# Patient Record
Sex: Male | Born: 1945 | Race: White | Hispanic: No | Marital: Married | Smoking: Former smoker
Health system: Southern US, Community
[De-identification: ages and names within clinical notes are randomized; demographics above are authoritative.]

## PROBLEM LIST (undated history)

## (undated) DIAGNOSIS — I10 Essential (primary) hypertension: Secondary | ICD-10-CM

## (undated) DIAGNOSIS — N189 Chronic kidney disease, unspecified: Secondary | ICD-10-CM

## (undated) DIAGNOSIS — I341 Nonrheumatic mitral (valve) prolapse: Secondary | ICD-10-CM

## (undated) DIAGNOSIS — D696 Thrombocytopenia, unspecified: Secondary | ICD-10-CM

## (undated) DIAGNOSIS — F411 Generalized anxiety disorder: Secondary | ICD-10-CM

## (undated) DIAGNOSIS — M1990 Unspecified osteoarthritis, unspecified site: Secondary | ICD-10-CM

## (undated) DIAGNOSIS — C801 Malignant (primary) neoplasm, unspecified: Secondary | ICD-10-CM

## (undated) DIAGNOSIS — F32A Depression, unspecified: Secondary | ICD-10-CM

## (undated) DIAGNOSIS — Z0000 Encounter for general adult medical examination without abnormal findings: Secondary | ICD-10-CM

## (undated) DIAGNOSIS — F329 Major depressive disorder, single episode, unspecified: Secondary | ICD-10-CM

## (undated) DIAGNOSIS — Z85528 Personal history of other malignant neoplasm of kidney: Secondary | ICD-10-CM

## (undated) DIAGNOSIS — Z8619 Personal history of other infectious and parasitic diseases: Secondary | ICD-10-CM

## (undated) DIAGNOSIS — R739 Hyperglycemia, unspecified: Secondary | ICD-10-CM

## (undated) DIAGNOSIS — H409 Unspecified glaucoma: Secondary | ICD-10-CM

## (undated) DIAGNOSIS — K5900 Constipation, unspecified: Secondary | ICD-10-CM

## (undated) DIAGNOSIS — G709 Myoneural disorder, unspecified: Secondary | ICD-10-CM

## (undated) DIAGNOSIS — L600 Ingrowing nail: Secondary | ICD-10-CM

## (undated) DIAGNOSIS — R251 Tremor, unspecified: Secondary | ICD-10-CM

## (undated) DIAGNOSIS — Z9289 Personal history of other medical treatment: Secondary | ICD-10-CM

## (undated) DIAGNOSIS — G20 Parkinson's disease: Secondary | ICD-10-CM

## (undated) DIAGNOSIS — M79672 Pain in left foot: Secondary | ICD-10-CM

## (undated) DIAGNOSIS — E785 Hyperlipidemia, unspecified: Secondary | ICD-10-CM

## (undated) HISTORY — DX: Nonrheumatic mitral (valve) prolapse: I34.1

## (undated) HISTORY — DX: Tremor, unspecified: R25.1

## (undated) HISTORY — DX: Encounter for general adult medical examination without abnormal findings: Z00.00

## (undated) HISTORY — DX: Hyperglycemia, unspecified: R73.9

## (undated) HISTORY — DX: Personal history of other medical treatment: Z92.89

## (undated) HISTORY — DX: Hyperlipidemia, unspecified: E78.5

## (undated) HISTORY — DX: Chronic kidney disease, unspecified: N18.9

## (undated) HISTORY — PX: TOTAL NEPHRECTOMY: SHX415

## (undated) HISTORY — PX: COLONOSCOPY: SHX174

## (undated) HISTORY — DX: Myoneural disorder, unspecified: G70.9

## (undated) HISTORY — DX: Essential (primary) hypertension: I10

## (undated) HISTORY — DX: Malignant (primary) neoplasm, unspecified: C80.1

## (undated) HISTORY — PX: TONSILLECTOMY: SUR1361

## (undated) HISTORY — DX: Parkinson's disease: G20

## (undated) HISTORY — DX: Generalized anxiety disorder: F41.1

## (undated) HISTORY — DX: Constipation, unspecified: K59.00

## (undated) HISTORY — DX: Ingrowing nail: L60.0

## (undated) HISTORY — PX: CATARACT EXTRACTION, BILATERAL: SHX1313

## (undated) HISTORY — DX: Unspecified glaucoma: H40.9

## (undated) HISTORY — DX: Personal history of other malignant neoplasm of kidney: Z85.528

## (undated) HISTORY — DX: Thrombocytopenia, unspecified: D69.6

## (undated) HISTORY — DX: Pain in left foot: M79.672

## (undated) HISTORY — PX: TOE SURGERY: SHX1073

## (undated) HISTORY — DX: Personal history of other infectious and parasitic diseases: Z86.19

---
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
## 1993-04-02 HISTORY — PX: APPENDECTOMY: SHX54

## 1997-04-02 HISTORY — PX: OTHER SURGICAL HISTORY: SHX169

## 2001-04-02 HISTORY — PX: OTHER SURGICAL HISTORY: SHX169

## 2008-04-02 DIAGNOSIS — Z9289 Personal history of other medical treatment: Secondary | ICD-10-CM

## 2008-04-02 HISTORY — DX: Personal history of other medical treatment: Z92.89

## 2011-02-08 LAB — HM COLONOSCOPY

## 2011-12-25 DIAGNOSIS — N183 Chronic kidney disease, stage 3 unspecified: Secondary | ICD-10-CM | POA: Insufficient documentation

## 2011-12-25 DIAGNOSIS — I10 Essential (primary) hypertension: Secondary | ICD-10-CM | POA: Insufficient documentation

## 2011-12-25 HISTORY — DX: Essential (primary) hypertension: I10

## 2011-12-25 HISTORY — DX: Chronic kidney disease, stage 3 unspecified: N18.30

## 2014-04-30 DIAGNOSIS — F4320 Adjustment disorder, unspecified: Secondary | ICD-10-CM | POA: Diagnosis not present

## 2014-04-30 DIAGNOSIS — I10 Essential (primary) hypertension: Secondary | ICD-10-CM | POA: Diagnosis not present

## 2014-04-30 DIAGNOSIS — E785 Hyperlipidemia, unspecified: Secondary | ICD-10-CM | POA: Diagnosis not present

## 2014-05-19 DIAGNOSIS — M25561 Pain in right knee: Secondary | ICD-10-CM | POA: Diagnosis not present

## 2014-05-19 DIAGNOSIS — M25562 Pain in left knee: Secondary | ICD-10-CM | POA: Diagnosis not present

## 2014-05-31 DIAGNOSIS — N451 Epididymitis: Secondary | ICD-10-CM | POA: Diagnosis not present

## 2014-05-31 DIAGNOSIS — Z01818 Encounter for other preprocedural examination: Secondary | ICD-10-CM | POA: Diagnosis not present

## 2014-06-18 DIAGNOSIS — N183 Chronic kidney disease, stage 3 (moderate): Secondary | ICD-10-CM | POA: Diagnosis not present

## 2014-06-18 DIAGNOSIS — C649 Malignant neoplasm of unspecified kidney, except renal pelvis: Secondary | ICD-10-CM | POA: Diagnosis not present

## 2014-06-18 DIAGNOSIS — N451 Epididymitis: Secondary | ICD-10-CM | POA: Diagnosis not present

## 2014-06-29 DIAGNOSIS — Z049 Encounter for examination and observation for unspecified reason: Secondary | ICD-10-CM | POA: Diagnosis not present

## 2014-06-29 DIAGNOSIS — I10 Essential (primary) hypertension: Secondary | ICD-10-CM | POA: Diagnosis not present

## 2014-06-29 DIAGNOSIS — Z7982 Long term (current) use of aspirin: Secondary | ICD-10-CM | POA: Diagnosis not present

## 2014-06-29 DIAGNOSIS — Z01818 Encounter for other preprocedural examination: Secondary | ICD-10-CM | POA: Diagnosis not present

## 2014-06-29 DIAGNOSIS — Z79899 Other long term (current) drug therapy: Secondary | ICD-10-CM | POA: Diagnosis not present

## 2014-07-06 HISTORY — PX: TOTAL KNEE ARTHROPLASTY: SHX125

## 2014-07-08 DIAGNOSIS — I10 Essential (primary) hypertension: Secondary | ICD-10-CM | POA: Diagnosis present

## 2014-07-08 DIAGNOSIS — Z96652 Presence of left artificial knee joint: Secondary | ICD-10-CM | POA: Diagnosis not present

## 2014-07-08 DIAGNOSIS — M179 Osteoarthritis of knee, unspecified: Secondary | ICD-10-CM | POA: Diagnosis not present

## 2014-07-08 DIAGNOSIS — Z85528 Personal history of other malignant neoplasm of kidney: Secondary | ICD-10-CM | POA: Diagnosis not present

## 2014-07-08 DIAGNOSIS — Z79899 Other long term (current) drug therapy: Secondary | ICD-10-CM | POA: Diagnosis not present

## 2014-07-08 DIAGNOSIS — Z882 Allergy status to sulfonamides status: Secondary | ICD-10-CM | POA: Diagnosis not present

## 2014-07-08 DIAGNOSIS — Z471 Aftercare following joint replacement surgery: Secondary | ICD-10-CM | POA: Diagnosis not present

## 2014-07-08 DIAGNOSIS — Z7982 Long term (current) use of aspirin: Secondary | ICD-10-CM | POA: Diagnosis not present

## 2014-07-08 DIAGNOSIS — M1712 Unilateral primary osteoarthritis, left knee: Secondary | ICD-10-CM | POA: Diagnosis not present

## 2014-08-04 DIAGNOSIS — M25562 Pain in left knee: Secondary | ICD-10-CM | POA: Diagnosis not present

## 2014-08-31 DIAGNOSIS — H524 Presbyopia: Secondary | ICD-10-CM | POA: Diagnosis not present

## 2014-08-31 DIAGNOSIS — H40003 Preglaucoma, unspecified, bilateral: Secondary | ICD-10-CM | POA: Diagnosis not present

## 2014-08-31 DIAGNOSIS — H2513 Age-related nuclear cataract, bilateral: Secondary | ICD-10-CM | POA: Diagnosis not present

## 2014-09-13 DIAGNOSIS — G479 Sleep disorder, unspecified: Secondary | ICD-10-CM | POA: Diagnosis not present

## 2014-09-13 DIAGNOSIS — I10 Essential (primary) hypertension: Secondary | ICD-10-CM | POA: Diagnosis not present

## 2014-09-13 DIAGNOSIS — F419 Anxiety disorder, unspecified: Secondary | ICD-10-CM | POA: Diagnosis not present

## 2014-11-22 DIAGNOSIS — M25551 Pain in right hip: Secondary | ICD-10-CM | POA: Diagnosis not present

## 2014-11-22 DIAGNOSIS — M25521 Pain in right elbow: Secondary | ICD-10-CM | POA: Diagnosis not present

## 2014-11-22 DIAGNOSIS — M545 Low back pain: Secondary | ICD-10-CM | POA: Diagnosis not present

## 2014-11-22 DIAGNOSIS — M549 Dorsalgia, unspecified: Secondary | ICD-10-CM | POA: Diagnosis not present

## 2015-01-25 DIAGNOSIS — E785 Hyperlipidemia, unspecified: Secondary | ICD-10-CM | POA: Diagnosis not present

## 2015-01-25 DIAGNOSIS — D126 Benign neoplasm of colon, unspecified: Secondary | ICD-10-CM | POA: Diagnosis not present

## 2015-01-25 DIAGNOSIS — Z23 Encounter for immunization: Secondary | ICD-10-CM | POA: Diagnosis not present

## 2015-01-25 DIAGNOSIS — I10 Essential (primary) hypertension: Secondary | ICD-10-CM | POA: Diagnosis not present

## 2015-02-08 ENCOUNTER — Ambulatory Visit (INDEPENDENT_AMBULATORY_CARE_PROVIDER_SITE_OTHER): Payer: Federal, State, Local not specified - PPO | Admitting: Family Medicine

## 2015-02-08 ENCOUNTER — Encounter: Payer: Self-pay | Admitting: Family Medicine

## 2015-02-08 VITALS — HR 68 | Resp 14 | Ht 75.0 in | Wt 241.0 lb

## 2015-02-08 DIAGNOSIS — L600 Ingrowing nail: Secondary | ICD-10-CM

## 2015-02-08 DIAGNOSIS — Z85528 Personal history of other malignant neoplasm of kidney: Secondary | ICD-10-CM

## 2015-02-08 DIAGNOSIS — R251 Tremor, unspecified: Secondary | ICD-10-CM

## 2015-02-08 DIAGNOSIS — D696 Thrombocytopenia, unspecified: Secondary | ICD-10-CM | POA: Diagnosis not present

## 2015-02-08 DIAGNOSIS — N189 Chronic kidney disease, unspecified: Secondary | ICD-10-CM

## 2015-02-08 DIAGNOSIS — I10 Essential (primary) hypertension: Secondary | ICD-10-CM

## 2015-02-08 DIAGNOSIS — E785 Hyperlipidemia, unspecified: Secondary | ICD-10-CM | POA: Diagnosis not present

## 2015-02-08 DIAGNOSIS — Z8619 Personal history of other infectious and parasitic diseases: Secondary | ICD-10-CM | POA: Insufficient documentation

## 2015-02-08 DIAGNOSIS — N289 Disorder of kidney and ureter, unspecified: Secondary | ICD-10-CM

## 2015-02-08 HISTORY — DX: Thrombocytopenia, unspecified: D69.6

## 2015-02-08 HISTORY — DX: Essential (primary) hypertension: I10

## 2015-02-08 HISTORY — DX: Hyperlipidemia, unspecified: E78.5

## 2015-02-08 HISTORY — DX: Personal history of other malignant neoplasm of kidney: Z85.528

## 2015-02-08 MED ORDER — MUPIROCIN 2 % EX OINT: 1.0000 "application " | TOPICAL_OINTMENT | Freq: Two times a day (BID) | CUTANEOUS | Status: DC

## 2015-02-08 NOTE — Assessment & Plan Note (Signed)
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

## 2015-02-08 NOTE — Progress Notes (Signed)
Pre visit review using our clinic review tool, if applicable. No additional management support is needed unless otherwise documented below in the visit note. 

## 2015-02-08 NOTE — Patient Instructions (Addendum)
Preventive Care for Adults, Male A healthy lifestyle and preventive care can promote health and wellness. Preventive health guidelines for men include the following key practices:  A routine yearly physical is a good way to check with your health care provider about your health and preventative screening. It is a chance to share any concerns and updates on your health and to receive a thorough exam.  Visit your dentist for a routine exam and preventative care every 6 months. Brush your teeth twice a day and floss once a day. Good oral hygiene prevents tooth decay and gum disease.  The frequency of eye exams is based on your age, health, family medical history, use of contact lenses, and other factors. Follow your health care provider's recommendations for frequency of eye exams.  Eat a healthy diet. Foods such as vegetables, fruits, whole grains, low-fat dairy products, and lean protein foods contain the nutrients you need without too many calories. Decrease your intake of foods high in solid fats, added sugars, and salt. Eat the right amount of calories for you.Get information about a proper diet from your health care provider, if necessary.  Regular physical exercise is one of the most important things you can do for your health. Most adults should get at least 150 minutes of moderate-intensity exercise (any activity that increases your heart rate and causes you to sweat) each week. In addition, most adults need muscle-strengthening exercises on 2 or more days a week.  Maintain a healthy weight. The body mass index (BMI) is a screening tool to identify possible weight problems. It provides an estimate of body fat based on height and weight. Your health care provider can find your BMI and can help you achieve or maintain a healthy weight.For adults 20 years and older:  A BMI below 18.5 is considered underweight.  A BMI of 18.5 to 24.9 is normal.  A BMI of 25 to 29.9 is considered  overweight.  A BMI of 30 and above is considered obese.  Maintain normal blood lipids and cholesterol levels by exercising and minimizing your intake of saturated fat. Eat a balanced diet with plenty of fruit and vegetables. Blood tests for lipids and cholesterol should begin at age 20 and be repeated every 5 years. If your lipid or cholesterol levels are high, you are over 50, or you are at high risk for heart disease, you may need your cholesterol levels checked more frequently.Ongoing high lipid and cholesterol levels should be treated with medicines if diet and exercise are not working.  If you smoke, find out from your health care provider how to quit. If you do not use tobacco, do not start.  Lung cancer screening is recommended for adults aged 55-80 years who are at high risk for developing lung cancer because of a history of smoking. A yearly low-dose CT scan of the lungs is recommended for people who have at least a 30-pack-year history of smoking and are a current smoker or have quit within the past 15 years. A pack year of smoking is smoking an average of 1 pack of cigarettes a day for 1 year (for example: 1 pack a day for 30 years or 2 packs a day for 15 years). Yearly screening should continue until the smoker has stopped smoking for at least 15 years. Yearly screening should be stopped for people who develop a health problem that would prevent them from having lung cancer treatment.  If you choose to drink alcohol, do not have more   than 2 drinks per day. One drink is considered to be 12 ounces (355 mL) of beer, 5 ounces (148 mL) of wine, or 1.5 ounces (44 mL) of liquor.  Avoid use of street drugs. Do not share needles with anyone. Ask for help if you need support or instructions about stopping the use of drugs.  High blood pressure causes heart disease and increases the risk of stroke. Your blood pressure should be checked at least every 1-2 years. Ongoing high blood pressure should be  treated with medicines, if weight loss and exercise are not effective.  If you are 34-90 years old, ask your health care provider if you should take aspirin to prevent heart disease.  Diabetes screening is done by taking a blood sample to check your blood glucose level after you have not eaten for a certain period of time (fasting). If you are not overweight and you do not have risk factors for diabetes, you should be screened once every 3 years starting at age 35. If you are overweight or obese and you are 70-84 years of age, you should be screened for diabetes every year as part of your cardiovascular risk assessment.  Colorectal cancer can be detected and often prevented. Most routine colorectal cancer screening begins at the age of 18 and continues through age 69. However, your health care provider may recommend screening at an earlier age if you have risk factors for colon cancer. On a yearly basis, your health care provider may provide home test kits to check for hidden blood in the stool. Use of a small camera at the end of a tube to directly examine the colon (sigmoidoscopy or colonoscopy) can detect the earliest forms of colorectal cancer. Talk to your health care provider about this at age 71, when routine screening begins. Direct exam of the colon should be repeated every 5-10 years through age 18, unless early forms of precancerous polyps or small growths are found.  People who are at an increased risk for hepatitis B should be screened for this virus. You are considered at high risk for hepatitis B if:  You were born in a country where hepatitis B occurs often. Talk with your health care provider about which countries are considered high risk.  Your parents were born in a high-risk country and you have not received a shot to protect against hepatitis B (hepatitis B vaccine).  You have HIV or AIDS.  You use needles to inject street drugs.  You live with, or have sex with, someone who  has hepatitis B.  You are a man who has sex with other men (MSM).  You get hemodialysis treatment.  You take certain medicines for conditions such as cancer, organ transplantation, and autoimmune conditions.  Hepatitis C blood testing is recommended for all people born from [birth year] through 1965 and any individual with known risks for hepatitis C.  Practice safe sex. Use condoms and avoid high-risk sexual practices to reduce the spread of sexually transmitted infections (STIs). STIs include gonorrhea, chlamydia, syphilis, trichomonas, herpes, HPV, and human immunodeficiency virus (HIV). Herpes, HIV, and HPV are viral illnesses that have no cure. They can result in disability, cancer, and death.  If you are a man who has sex with other men, you should be screened at least once per year for:  HIV.  Urethral, rectal, and pharyngeal infection of gonorrhea, chlamydia, or both.  If you are at risk of being infected with HIV, it is recommended that you take a  prescription medicine daily to prevent HIV infection. This is called preexposure prophylaxis (PrEP). You are considered at risk if:  You are a man who has sex with other men (MSM) and have other risk factors.  You are a heterosexual man, are sexually active, and are at increased risk for HIV infection.  You take drugs by injection.  You are sexually active with a partner who has HIV.  Talk with your health care provider about whether you are at high risk of being infected with HIV. If you choose to begin PrEP, you should first be tested for HIV. You should then be tested every 3 months for as long as you are taking PrEP.  A one-time screening for abdominal aortic aneurysm (AAA) and surgical repair of large AAAs by ultrasound are recommended for men ages 44 to 66 years who are current or former smokers.  Healthy men should no longer receive prostate-specific antigen (PSA) blood tests as part of routine cancer screening. Talk with your health  care provider about prostate cancer screening.  Testicular cancer screening is not recommended for adult males who have no symptoms. Screening includes self-exam, a health care provider exam, and other screening tests. Consult with your health care provider about any symptoms you have or any concerns you have about testicular cancer.  Use sunscreen. Apply sunscreen liberally and repeatedly throughout the day. You should seek shade when your shadow is shorter than you. Protect yourself by wearing long sleeves, pants, a wide-brimmed hat, and sunglasses year round, whenever you are outdoors.  Once a month, do a whole-body skin exam, using a mirror to look at the skin on your back. Tell your health care provider about new moles, moles that have irregular borders, moles that are larger than a pencil eraser, or moles that have changed in shape or color.  Stay current with required vaccines (immunizations).  Influenza vaccine. All adults should be immunized every year.  Tetanus, diphtheria, and acellular pertussis (Td, Tdap) vaccine. An adult who has not previously received Tdap or who does not know his vaccine status should receive 1 dose of Tdap. This initial dose should be followed by tetanus and diphtheria toxoids (Td) booster doses every 10 years. Adults with an unknown or incomplete history of completing a 3-dose immunization series with Td-containing vaccines should begin or complete a primary immunization series including a Tdap dose. Adults should receive a Td booster every 10 years.  Varicella vaccine. An adult without evidence of immunity to varicella should receive 2 doses or a second dose if he has previously received 1 dose.  Human papillomavirus (HPV) vaccine. Males aged 11-21 years who have not received the vaccine previously should receive the 3-dose series. Males aged 22-26 years may be immunized. Immunization is recommended through the age of 23 years for any male who has sex with males  and did not get any or all doses earlier. Immunization is recommended for any person with an immunocompromised condition through the age of 72 years if he did not get any or all doses earlier. During the 3-dose series, the second dose should be obtained 4-8 weeks after the first dose. The third dose should be obtained 24 weeks after the first dose and 16 weeks after the second dose.  Zoster vaccine. One dose is recommended for adults aged 23 years or older unless certain conditions are present.  Measles, mumps, and rubella (MMR) vaccine. Adults born before [birth year] generally are considered immune to measles and mumps. Adults born in [birth year]  or later should have 1 or more doses of MMR vaccine unless there is a contraindication to the vaccine or there is laboratory evidence of immunity to each of the three diseases. A routine second dose of MMR vaccine should be obtained at least 28 days after the first dose for students attending postsecondary schools, health care workers, or international travelers. People who received inactivated measles vaccine or an unknown type of measles vaccine during 1963-1967 should receive 2 doses of MMR vaccine. People who received inactivated mumps vaccine or an unknown type of mumps vaccine before 1979 and are at high risk for mumps infection should consider immunization with 2 doses of MMR vaccine. Unvaccinated health care workers born before [birth year] who lack laboratory evidence of measles, mumps, or rubella immunity or laboratory confirmation of disease should consider measles and mumps immunization with 2 doses of MMR vaccine or rubella immunization with 1 dose of MMR vaccine.  Pneumococcal 13-valent conjugate (PCV13) vaccine. When indicated, a person who is uncertain of his immunization history and has no record of immunization should receive the PCV13 vaccine. All adults 9 years of age and older should receive this vaccine. An adult aged 69 years or older who has certain medical  conditions and has not been previously immunized should receive 1 dose of PCV13 vaccine. This PCV13 should be followed with a dose of pneumococcal polysaccharide (PPSV23) vaccine. Adults who are at high risk for pneumococcal disease should obtain the PPSV23 vaccine at least 8 weeks after the dose of PCV13 vaccine. Adults older than 71 years of age who have normal immune system function should obtain the PPSV23 vaccine dose at least 1 year after the dose of PCV13 vaccine.  Pneumococcal polysaccharide (PPSV23) vaccine. When PCV13 is also indicated, PCV13 should be obtained first. All adults aged 79 years and older should be immunized. An adult younger than age 43 years who has certain medical conditions should be immunized. Any person who resides in a nursing home or long-term care facility should be immunized. An adult smoker should be immunized. People with an immunocompromised condition and certain other conditions should receive both PCV13 and PPSV23 vaccines. People with human immunodeficiency virus (HIV) infection should be immunized as soon as possible after diagnosis. Immunization during chemotherapy or radiation therapy should be avoided. Routine use of PPSV23 vaccine is not recommended for American Indians, [place] Natives, or people younger than 65 years unless there are medical conditions that require PPSV23 vaccine. When indicated, people who have unknown immunization and have no record of immunization should receive PPSV23 vaccine. One-time revaccination 5 years after the first dose of PPSV23 is recommended for people aged 19-64 years who have chronic kidney failure, nephrotic syndrome, asplenia, or immunocompromised conditions. People who received 1-2 doses of PPSV23 before age 70 years should receive another dose of PPSV23 vaccine at age 79 years or later if at least 5 years have passed since the previous dose. Doses of PPSV23 are not needed for people immunized with PPSV23 at or after age 55  years.  Meningococcal vaccine. Adults with asplenia or persistent complement component deficiencies should receive 2 doses of quadrivalent meningococcal conjugate (MenACWY-D) vaccine. The doses should be obtained at least 2 months apart. Microbiologists working with certain meningococcal bacteria, [employer] recruits, people at risk during an outbreak, and people who travel to or live in countries with a high rate of meningitis should be immunized. A first-year college student up through age 64 years who is living in a residence hall should receive a  dose if he did not receive a dose on or after his 16th birthday. Adults who have certain high-risk conditions should receive one or more doses of vaccine.  Hepatitis A vaccine. Adults who wish to be protected from this disease, have chronic liver disease, work with hepatitis A-infected animals, work in hepatitis A research labs, or travel to or work in countries with a high rate of hepatitis A should be immunized. Adults who were previously unvaccinated and who anticipate close contact with an international adoptee during the first 60 days after arrival in the [place] from a country with a high rate of hepatitis A should be immunized.  Hepatitis B vaccine. Adults should be immunized if they wish to be protected from this disease, are under age 34 years and have diabetes, have chronic liver disease, have had more than one sex partner in the past 6 months, may be exposed to blood or other infectious body fluids, are household contacts or sex partners of hepatitis B positive people, are clients or workers in certain care facilities, or travel to or work in countries with a high rate of hepatitis B.  Haemophilus influenzae type b (Hib) vaccine. A previously unvaccinated person with asplenia or sickle cell disease or having a scheduled splenectomy should receive 1 dose of Hib vaccine. Regardless of previous immunization, a recipient of a hematopoietic stem cell  transplant should receive a 3-dose series 6-12 months after his successful transplant. Hib vaccine is not recommended for adults with HIV infection. Preventive Service / Frequency Ages 77 to 55  Blood pressure check.** / Every 3-5 years.  Lipid and cholesterol check.** / Every 5 years beginning at age 66.  Hepatitis C blood test.** / For any individual with known risks for hepatitis C.  Skin self-exam. / Monthly.  Influenza vaccine. / Every year.  Tetanus, diphtheria, and acellular pertussis (Tdap, Td) vaccine.** / Consult your health care provider. 1 dose of Td every 10 years.  Varicella vaccine.** / Consult your health care provider.  HPV vaccine. / 3 doses over 6 months, if 45 or younger.  Measles, mumps, rubella (MMR) vaccine.** / You need at least 1 dose of MMR if you were born in 1957 or later. You may also need a second dose.  Pneumococcal 13-valent conjugate (PCV13) vaccine.** / Consult your health care provider.  Pneumococcal polysaccharide (PPSV23) vaccine.** / 1 to 2 doses if you smoke cigarettes or if you have certain conditions.  Meningococcal vaccine.** / 1 dose if you are age 81 to 79 years and a Market researcher living in a residence hall, or have one of several medical conditions. You may also need additional booster doses.  Hepatitis A vaccine.** / Consult your health care provider.  Hepatitis B vaccine.** / Consult your health care provider.  Haemophilus influenzae type b (Hib) vaccine.** / Consult your health care provider. Ages 6 to 58  Blood pressure check.** / Every year.  Lipid and cholesterol check.** / Every 5 years beginning at age 89.  Lung cancer screening. / Every year if you are aged 84-80 years and have a 30-pack-year history of smoking and currently smoke or have quit within the past 15 years. Yearly screening is stopped once you have quit smoking for at least 15 years or develop a health problem that would prevent you from having  lung cancer treatment.  Fecal occult blood test (FOBT) of stool. / Every year beginning at age 90 and continuing until age 73. You may not have to do  this test if you get a colonoscopy every 10 years.  Flexible sigmoidoscopy** or colonoscopy.** / Every 5 years for a flexible sigmoidoscopy or every 10 years for a colonoscopy beginning at age 50 and continuing until age 75.  Hepatitis C blood test.** / For all people born from 1945 through 1965 and any individual with known risks for hepatitis C.  Skin self-exam. / Monthly.  Influenza vaccine. / Every year.  Tetanus, diphtheria, and acellular pertussis (Tdap/Td) vaccine.** / Consult your health care provider. 1 dose of Td every 10 years.  Varicella vaccine.** / Consult your health care provider.  Zoster vaccine.** / 1 dose for adults aged 60 years or older.  Measles, mumps, rubella (MMR) vaccine.** / You need at least 1 dose of MMR if you were born in 1957 or later. You may also need a second dose.  Pneumococcal 13-valent conjugate (PCV13) vaccine.** / Consult your health care provider.  Pneumococcal polysaccharide (PPSV23) vaccine.** / 1 to 2 doses if you smoke cigarettes or if you have certain conditions.  Meningococcal vaccine.** / Consult your health care provider.  Hepatitis A vaccine.** / Consult your health care provider.  Hepatitis B vaccine.** / Consult your health care provider.  Haemophilus influenzae type b (Hib) vaccine.** / Consult your health care provider. Ages 65 and over  Blood pressure check.** / Every year.  Lipid and cholesterol check.**/ Every 5 years beginning at age 20.  Lung cancer screening. / Every year if you are aged 55-80 years and have a 30-pack-year history of smoking and currently smoke or have quit within the past 15 years. Yearly screening is stopped once you have quit smoking for at least 15 years or develop a health problem that would prevent you from having lung cancer treatment.  Fecal  occult blood test (FOBT) of stool. / Every year beginning at age 50 and continuing until age 75. You may not have to do this test if you get a colonoscopy every 10 years.  Flexible sigmoidoscopy** or colonoscopy.** / Every 5 years for a flexible sigmoidoscopy or every 10 years for a colonoscopy beginning at age 50 and continuing until age 75.  Hepatitis C blood test.** / For all people born from 1945 through 1965 and any individual with known risks for hepatitis C.  Abdominal aortic aneurysm (AAA) screening.** / A one-time screening for ages 65 to 75 years who are current or former smokers.  Skin self-exam. / Monthly.  Influenza vaccine. / Every year.  Tetanus, diphtheria, and acellular pertussis (Tdap/Td) vaccine.** / 1 dose of Td every 10 years.  Varicella vaccine.** / Consult your health care provider.  Zoster vaccine.** / 1 dose for adults aged 60 years or older.  Pneumococcal 13-valent conjugate (PCV13) vaccine.** / 1 dose for all adults aged 65 years and older.  Pneumococcal polysaccharide (PPSV23) vaccine.** / 1 dose for all adults aged 65 years and older.  Meningococcal vaccine.** / Consult your health care provider.  Hepatitis A vaccine.** / Consult your health care provider.  Hepatitis B vaccine.** / Consult your health care provider.  Haemophilus influenzae type b (Hib) vaccine.** / Consult your health care provider. **Family history and personal history of risk and conditions may change your health care provider's recommendations.   This information is not intended to replace advice given to you by your health care provider. Make sure you discuss any questions you have with your health care provider.   Document Released: 05/15/2001 Document Revised: 04/09/2014 Document Reviewed: 08/14/2010 Elsevier Interactive Patient Education 2016   [place] in 1/2 hot water and 1/2 hydrogen peroxide 15 minutes once to twice daily. Then clean with hydrogen peroxide, then pull  skin away from toenail if able and apply cotton with Mupirocin applied Call for antibiotic if red, hot or pus filled, if not resolved can refer to podiatry for further care Ingrown Toenail An ingrown toenail occurs when the corner or sides of your toenail grow into the surrounding skin. The big toe is most commonly affected, but it can happen to any of your toes. If your ingrown toenail is not treated, you will be at risk for infection. CAUSES This condition may be caused by: Wearing shoes that are too small or tight. Injury or trauma, such as stubbing your toe or having your toe stepped on. Improper cutting or care of your toenails. Being born with (congenital) nail or foot abnormalities, such as having a nail that is too big for your toe. RISK FACTORS Risk factors for an ingrown toenail include: Age. Your nails tend to thicken as you get older, so ingrown nails are more common in older people. Diabetes. Cutting your toenails incorrectly. Blood circulation problems. SYMPTOMS Symptoms may include: Pain, soreness, or tenderness. Redness. Swelling. Hardening of the skin surrounding the toe. Your ingrown toenail may be infected if there is fluid, pus, or drainage. DIAGNOSIS  An ingrown toenail may be diagnosed by medical history and physical exam. If your toenail is infected, your health care provider may test a sample of the drainage. TREATMENT Treatment depends on the severity of your ingrown toenail. Some ingrown toenails may be treated at home. More severe or infected ingrown toenails may require surgery to remove all or part of the nail. Infected ingrown toenails may also be treated with antibiotic medicines. HOME CARE INSTRUCTIONS If you were prescribed an antibiotic medicine, finish all of it even if you start to feel better. Soak your foot in warm soapy water for 20 minutes, 3 times per day or as directed by your health care provider. Carefully lift the edge of the nail away from  the sore skin by wedging a small piece of cotton under the corner of the nail. This may help with the pain. Be careful not to cause more injury to the area. Wear shoes that fit well. If your ingrown toenail is causing you pain, try wearing sandals, if possible. Trim your toenails regularly and carefully. Do not cut them in a curved shape. Cut your toenails straight across. This prevents injury to the skin at the corners of the toenail. Keep your feet clean and dry. If you are having trouble walking and are given crutches by your health care provider, use them as directed. Do not pick at your toenail or try to remove it yourself. Take medicines only as directed by your health care provider. Keep all follow-up visits as directed by your health care provider. This is important. SEEK MEDICAL CARE IF: Your symptoms do not improve with treatment. SEEK IMMEDIATE MEDICAL CARE IF: You have red streaks that start at your foot and go up your leg. You have a fever. You have increased redness, swelling, or pain. You have fluid, blood, or pus coming from your toenail.   This information is not intended to replace advice given to you by your health care provider. Make sure you discuss any questions you have with your health care provider.   Document Released: 03/16/2000 Document Revised: 08/03/2014 Document Reviewed: 02/10/2014 Elsevier Interactive Patient Education [employer].

## 2015-02-09 LAB — CBC
HCT: 46.8 % (ref 39.0–52.0)
Hemoglobin: 15.6 g/dL (ref 13.0–17.0)
MCHC: 33.3 g/dL (ref 30.0–36.0)
MCV: 90.6 fl (ref 78.0–100.0)
PLATELETS: 184 10*3/uL (ref 150.0–400.0)
RBC: 5.17 Mil/uL (ref 4.22–5.81)
RDW: 14.4 % (ref 11.5–15.5)
WBC: 9.3 10*3/uL (ref 4.0–10.5)

## 2015-02-09 LAB — COMPREHENSIVE METABOLIC PANEL
ALBUMIN: 4.7 g/dL (ref 3.5–5.2)
ALT: 22 U/L (ref 0–53)
AST: 19 U/L (ref 0–37)
Alkaline Phosphatase: 77 U/L (ref 39–117)
BILIRUBIN TOTAL: 0.5 mg/dL (ref 0.2–1.2)
BUN: 20 mg/dL (ref 6–23)
CALCIUM: 10.3 mg/dL (ref 8.4–10.5)
CO2: 25 meq/L (ref 19–32)
CREATININE: 1.64 mg/dL — AB (ref 0.40–1.50)
Chloride: 105 mEq/L (ref 96–112)
GFR: 44.14 mL/min — ABNORMAL LOW (ref >60.00)
Glucose, Bld: 146 mg/dL — ABNORMAL HIGH (ref 70–99)
Potassium: 4.6 mEq/L (ref 3.5–5.1)
Sodium: 139 mEq/L (ref 135–145)
TOTAL PROTEIN: 7.3 g/dL (ref 6.0–8.3)

## 2015-02-09 LAB — TSH: TSH: 1.74 u[IU]/mL (ref 0.35–4.50)

## 2015-02-09 LAB — LIPID PANEL
CHOL/HDL RATIO: 3
CHOLESTEROL: 126 mg/dL (ref 0–200)
HDL: 47.5 mg/dL (ref >39.00)
LDL Cholesterol: 45 mg/dL (ref 0–99)
NonHDL: 78.91
Triglycerides: 172 mg/dL — ABNORMAL HIGH (ref 0.0–149.0)
VLDL: 34.4 mg/dL (ref 0.0–40.0)

## 2015-02-10 ENCOUNTER — Other Ambulatory Visit (INDEPENDENT_AMBULATORY_CARE_PROVIDER_SITE_OTHER): Payer: Federal, State, Local not specified - PPO

## 2015-02-10 DIAGNOSIS — R7309 Other abnormal glucose: Secondary | ICD-10-CM

## 2015-02-10 LAB — HEMOGLOBIN A1C: HEMOGLOBIN A1C: 5.9 % (ref 4.6–6.5)

## 2015-02-14 ENCOUNTER — Encounter: Payer: Self-pay | Admitting: Family Medicine

## 2015-02-14 ENCOUNTER — Other Ambulatory Visit: Payer: Self-pay | Admitting: Family Medicine

## 2015-02-14 DIAGNOSIS — I10 Essential (primary) hypertension: Secondary | ICD-10-CM

## 2015-02-14 DIAGNOSIS — R251 Tremor, unspecified: Secondary | ICD-10-CM | POA: Insufficient documentation

## 2015-02-14 DIAGNOSIS — N289 Disorder of kidney and ureter, unspecified: Secondary | ICD-10-CM

## 2015-02-14 DIAGNOSIS — M79672 Pain in left foot: Secondary | ICD-10-CM | POA: Insufficient documentation

## 2015-02-14 DIAGNOSIS — R7309 Other abnormal glucose: Secondary | ICD-10-CM

## 2015-02-14 DIAGNOSIS — N189 Chronic kidney disease, unspecified: Secondary | ICD-10-CM

## 2015-02-14 DIAGNOSIS — L600 Ingrowing nail: Secondary | ICD-10-CM

## 2015-02-14 HISTORY — DX: Ingrowing nail: L60.0

## 2015-02-14 HISTORY — DX: Pain in left foot: M79.672

## 2015-02-14 HISTORY — DX: Chronic kidney disease, unspecified: N18.9

## 2015-02-14 HISTORY — DX: Tremor, unspecified: R25.1

## 2015-02-14 NOTE — Assessment & Plan Note (Addendum)
Will monitor. Follows with nephrology

## 2015-02-14 NOTE — Assessment & Plan Note (Signed)
Tolerating statin, encouraged heart healthy diet, avoid trans fats, minimize simple carbs and saturated fats. Increase exercise as tolerated 

## 2015-02-14 NOTE — Assessment & Plan Note (Signed)
Encouraged to soak in H2O2 and H20 qhs and then separate skin from toenail and apply antibiotic ointment and cotton, if not improving will need referral to podiatry.

## 2015-02-14 NOTE — Assessment & Plan Note (Signed)
Long standing but slowly worsening, consider referral to neurology

## 2015-02-14 NOTE — Assessment & Plan Note (Signed)
no changes to meds. Encouraged heart healthy diet such as the DASH diet and exercise as tolerated.  

## 2015-02-14 NOTE — Assessment & Plan Note (Signed)
Platelets WNL on labs today

## 2015-02-14 NOTE — Progress Notes (Signed)
Subjective:    Patient ID: Vincent Walker, male    DOB: 07-01-1945, 69 y.o.   MRN: [id number]  Chief Complaint  Patient presents with  . Establish Care    HPI Patient is in today for new patient appointment. He has a past medical history significant for hypertension, chronic renal insufficiency secondary to her history of renal cell carcinoma and renal excision. Also noted to have dyslipidemia, thrombocytopenia, chickenpox, tremor. Feels well today, no acute concerns other than a mildly tender ingrown toenail in left great toenail. Denies CP/palp/SOB/HA/congestion/fevers/GI or GU c/o. Taking meds as prescribed  Past Medical History  Diagnosis Date  . Cancer [facility])     Kidney Cancer  . Mitral valve prolapse   . Hypertension   . Chronic kidney disease   . History of blood transfusion 2010    After Kidney surgery  . Benign essential HTN 02/08/2015  . Dyslipidemia 02/08/2015  . H/O renal cell carcinoma 02/08/2015  . Thrombocytopenia ([place]) 02/08/2015  . History of chicken pox   . Hyperlipidemia   . Tremor of right hand 02/14/2015  . Ingrown left big toenail 02/14/2015  . Chronic renal insufficiency 02/14/2015    Past Surgical History  Procedure Laterality Date  . Appendectomy  1995  . Tonsillectomy    . Right knee scope  1999  . Left knee scope  2003  . Total knee arthroplasty Left 07/06/2014  . Total nephrectomy Right     Family History  Problem Relation Age of Onset  . Alcohol abuse Father   . Hyperlipidemia Father   . Hypertension Father   . Diabetes Father   . Heart disease Father   . Cancer Maternal Aunt     lung cancer  . Cancer Paternal Uncle     bone cancer  . Stroke Mother     brain stem at age 36    Social History   Social History  . Marital Status: Married    Spouse Name: N/A  . Number of Children: N/A  . Years of Education: 16   Occupational History  . 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 Smoking status: Former  Smoker    Types: Pipe  . Smokeless tobacco: Not on file     Comment: stopped 20 years ago.  1996  . Alcohol Use: 0.0 oz/week    0 Standard drinks or equivalent per week     Comment: occasional  . Drug Use: No  . 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 Not on file   Social History Narrative    No outpatient prescriptions prior to visit.   No facility-administered medications prior to visit.    Allergies  Allergen Reactions  . Sulfa Antibiotics Other (See Comments)    Had a reaction as a child    Review of Systems  Constitutional: Negative for fever, chills and malaise/fatigue.  HENT: Negative for congestion and hearing loss.   Eyes: Negative for discharge.  Respiratory: Negative for cough, sputum production and shortness of breath.   Cardiovascular: Negative for chest pain, palpitations and leg swelling.  Gastrointestinal: Negative for heartburn, nausea, vomiting, abdominal pain, diarrhea, constipation and blood in stool.  Genitourinary: Negative for dysuria, urgency, frequency and hematuria.  Musculoskeletal: Positive for joint pain. Negative for myalgias, back pain and falls.  Skin: Negative for rash.  Neurological: Positive for tremors. Negative for dizziness, sensory change, loss of consciousness, weakness and headaches.  Endo/Heme/Allergies: Negative for environmental allergies. Does not bruise/bleed easily.  Psychiatric/Behavioral: Negative for depression and suicidal ideas. The patient is not nervous/anxious and does not have insomnia.        Objective:    Physical Exam  Constitutional: He is oriented to person, place, and time. He appears well-developed and well-nourished. No distress.  HENT:  Head: Normocephalic and atraumatic.  Eyes: Conjunctivae are normal.  Neck: Neck supple. No thyromegaly present.  Cardiovascular: Normal rate, regular  rhythm and normal heart sounds.   No murmur heard. Pulmonary/Chest: Effort normal and breath sounds normal. No respiratory distress. He has no wheezes.  Abdominal: Soft. Bowel sounds are normal. He exhibits no mass. There is no tenderness.  Musculoskeletal: He exhibits no edema.  Lymphadenopathy:    He has no cervical adenopathy.  Neurological: He is alert and oriented to person, place, and time.  Fine tremor right arm resting  Skin: Skin is warm and dry.  Left great toe, nail bed along medial aspect of toe reveals skin that is swollen and mildly erythematous. No tenderness or discharge.  Psychiatric: He has a normal mood and affect. His behavior is normal.    Pulse 68  Resp 14  Ht 6\' 3"  (1.905 m)  Wt 241 lb (109.317 kg)  BMI 30.12 kg/m2 Wt Readings from Last 3 Encounters:  02/14/15 241 lb (109.317 kg)     Lab Results  Component Value Date   WBC 9.3 02/08/2015   HGB 15.6 02/08/2015   HCT 46.8 02/08/2015   PLT 184.0 02/08/2015   GLUCOSE 146* 02/08/2015   CHOL 126 02/08/2015   TRIG 172.0* 02/08/2015   HDL 47.50 02/08/2015   LDLCALC 45 02/08/2015   ALT 22 02/08/2015   AST 19 02/08/2015   NA 139 02/08/2015   K 4.6 02/08/2015   CL 105 02/08/2015   CREATININE 1.64* 02/08/2015   BUN 20 02/08/2015   CO2 25 02/08/2015   TSH 1.74 02/08/2015   HGBA1C 5.9 02/10/2015    Lab Results  Component Value Date   TSH 1.74 02/08/2015   Lab Results  Component Value Date   WBC 9.3 02/08/2015   HGB 15.6 02/08/2015   HCT 46.8 02/08/2015   MCV 90.6 02/08/2015   PLT 184.0 02/08/2015   Lab Results  Component Value Date   NA 139 02/08/2015   K 4.6 02/08/2015   CO2 25 02/08/2015   GLUCOSE 146* 02/08/2015   BUN 20 02/08/2015   CREATININE 1.64* 02/08/2015   BILITOT 0.5 02/08/2015   ALKPHOS 77 02/08/2015   AST 19 02/08/2015   ALT 22 02/08/2015   PROT 7.3 02/08/2015   ALBUMIN 4.7 02/08/2015   CALCIUM 10.3 02/08/2015   GFR 44.14* 02/08/2015   Lab Results  Component Value Date    CHOL 126 02/08/2015   Lab Results  Component Value Date   HDL 47.50 02/08/2015   Lab Results  Component Value Date   LDLCALC 45 02/08/2015   Lab Results  Component Value Date   TRIG 172.0* 02/08/2015   Lab Results  Component Value Date   CHOLHDL 3 02/08/2015   Lab Results  Component Value Date   HGBA1C 5.9 02/10/2015       Assessment & Plan:   Problem List Items Addressed This Visit    Benign essential HTN - Primary    no changes to meds. Encouraged heart healthy diet such as the DASH diet  and exercise as tolerated.       Relevant Medications   atenolol (TENORMIN) 50 MG tablet   amLODipine (NORVASC) 10 MG tablet   atorvastatin (LIPITOR) 20 MG tablet   aspirin 81 MG tablet   Other Relevant Orders   TSH (Completed)   CBC (Completed)   Comprehensive metabolic panel (Completed)   Lipid panel (Completed)   Chronic renal insufficiency    Will monitor. Follows with nephrology      Dyslipidemia    Tolerating statin, encouraged heart healthy diet, avoid trans fats, minimize simple carbs and saturated fats. Increase exercise as tolerated      Relevant Medications   atorvastatin (LIPITOR) 20 MG tablet   Other Relevant Orders   TSH (Completed)   CBC (Completed)   Comprehensive metabolic panel (Completed)   Lipid panel (Completed)   H/O renal cell carcinoma    diagnosed and removed in 2010 Right Monitored by Dr Tresa Endo of urology at [facility] Dr Richarda Overlie, nephrology at [facility]      Relevant Orders   TSH (Completed)   CBC (Completed)   Comprehensive metabolic panel (Completed)   Lipid panel (Completed)   Ingrown left big toenail    Encouraged to soak in H2O2 and H20 qhs and then separate skin from toenail and apply antibiotic ointment and cotton, if not improving will need referral to podiatry.      Thrombocytopenia ([facility])    Platelets WNL on labs today      Relevant Orders   TSH (Completed)   CBC (Completed)   Comprehensive metabolic panel  (Completed)   Lipid panel (Completed)   Tremor of right hand    Long standing but slowly worsening, consider referral to neurology         I am having Mr. Sturch start on mupirocin ointment. I am also having him maintain his atenolol, amLODipine, atorvastatin, aspirin, Multiple Vitamins-Minerals (CENTRUM SILVER PO), and LORazepam.  Meds ordered this encounter  Medications  . atenolol (TENORMIN) 50 MG tablet    Sig: Take 50 mg by mouth daily.  Marland Kitchen amLODipine (NORVASC) 10 MG tablet    Sig: Take 10 mg by mouth daily.  Marland Kitchen atorvastatin (LIPITOR) 20 MG tablet    Sig: Take 20 mg by mouth daily.  Marland Kitchen aspirin 81 MG tablet    Sig: Take 81 mg by mouth daily.  . Multiple Vitamins-Minerals (CENTRUM SILVER PO)    Sig: Take by mouth daily.  Marland Kitchen LORazepam (ATIVAN) 1 MG tablet    Sig: Take 1 mg by mouth daily as needed for anxiety.  . mupirocin ointment (BACTROBAN) 2 %    Sig: Place 1 application into the nose 2 (two) times daily.    Dispense:  30 g    Refill:  0     [name], MD

## 2015-02-16 DIAGNOSIS — H01001 Unspecified blepharitis right upper eyelid: Secondary | ICD-10-CM | POA: Diagnosis not present

## 2015-02-16 DIAGNOSIS — H40003 Preglaucoma, unspecified, bilateral: Secondary | ICD-10-CM | POA: Diagnosis not present

## 2015-02-16 DIAGNOSIS — Z83511 Family history of glaucoma: Secondary | ICD-10-CM | POA: Diagnosis not present

## 2015-02-16 DIAGNOSIS — H2513 Age-related nuclear cataract, bilateral: Secondary | ICD-10-CM | POA: Diagnosis not present

## 2015-02-18 ENCOUNTER — Other Ambulatory Visit (INDEPENDENT_AMBULATORY_CARE_PROVIDER_SITE_OTHER): Payer: Federal, State, Local not specified - PPO

## 2015-02-18 DIAGNOSIS — R7309 Other abnormal glucose: Secondary | ICD-10-CM

## 2015-02-18 DIAGNOSIS — N289 Disorder of kidney and ureter, unspecified: Secondary | ICD-10-CM | POA: Diagnosis not present

## 2015-02-18 DIAGNOSIS — I10 Essential (primary) hypertension: Secondary | ICD-10-CM | POA: Diagnosis not present

## 2015-02-18 LAB — COMPREHENSIVE METABOLIC PANEL
ALBUMIN: 4.6 g/dL (ref 3.5–5.2)
ALT: 26 U/L (ref 0–53)
AST: 23 U/L (ref 0–37)
Alkaline Phosphatase: 72 U/L (ref 39–117)
BUN: 18 mg/dL (ref 6–23)
CHLORIDE: 103 meq/L (ref 96–112)
CO2: 25 mEq/L (ref 19–32)
CREATININE: 1.61 mg/dL — AB (ref 0.40–1.50)
Calcium: 9.9 mg/dL (ref 8.4–10.5)
GFR: 45.09 mL/min — ABNORMAL LOW (ref >60.00)
GLUCOSE: 116 mg/dL — AB (ref 70–99)
Potassium: 4.7 mEq/L (ref 3.5–5.1)
SODIUM: 138 meq/L (ref 135–145)
TOTAL PROTEIN: 7.3 g/dL (ref 6.0–8.3)
Total Bilirubin: 0.8 mg/dL (ref 0.2–1.2)

## 2015-02-18 LAB — HEMOGLOBIN A1C: HEMOGLOBIN A1C: 5.9 % (ref 4.6–6.5)

## 2015-06-21 DIAGNOSIS — L821 Other seborrheic keratosis: Secondary | ICD-10-CM | POA: Diagnosis not present

## 2015-06-21 DIAGNOSIS — L570 Actinic keratosis: Secondary | ICD-10-CM | POA: Diagnosis not present

## 2015-06-21 DIAGNOSIS — L218 Other seborrheic dermatitis: Secondary | ICD-10-CM | POA: Diagnosis not present

## 2015-07-07 DIAGNOSIS — Z79899 Other long term (current) drug therapy: Secondary | ICD-10-CM | POA: Diagnosis not present

## 2015-07-07 DIAGNOSIS — Z87891 Personal history of nicotine dependence: Secondary | ICD-10-CM | POA: Diagnosis not present

## 2015-07-07 DIAGNOSIS — Z08 Encounter for follow-up examination after completed treatment for malignant neoplasm: Secondary | ICD-10-CM | POA: Diagnosis not present

## 2015-07-07 DIAGNOSIS — N183 Chronic kidney disease, stage 3 (moderate): Secondary | ICD-10-CM | POA: Diagnosis not present

## 2015-07-07 DIAGNOSIS — Z905 Acquired absence of kidney: Secondary | ICD-10-CM | POA: Diagnosis not present

## 2015-07-07 DIAGNOSIS — Z7982 Long term (current) use of aspirin: Secondary | ICD-10-CM | POA: Diagnosis not present

## 2015-07-07 DIAGNOSIS — Z85528 Personal history of other malignant neoplasm of kidney: Secondary | ICD-10-CM | POA: Diagnosis not present

## 2015-07-07 DIAGNOSIS — Z882 Allergy status to sulfonamides status: Secondary | ICD-10-CM | POA: Diagnosis not present

## 2015-07-07 DIAGNOSIS — I129 Hypertensive chronic kidney disease with stage 1 through stage 4 chronic kidney disease, or unspecified chronic kidney disease: Secondary | ICD-10-CM | POA: Diagnosis not present

## 2015-08-17 DIAGNOSIS — H2513 Age-related nuclear cataract, bilateral: Secondary | ICD-10-CM | POA: Diagnosis not present

## 2015-08-17 DIAGNOSIS — H01001 Unspecified blepharitis right upper eyelid: Secondary | ICD-10-CM | POA: Diagnosis not present

## 2015-08-17 DIAGNOSIS — H5203 Hypermetropia, bilateral: Secondary | ICD-10-CM | POA: Diagnosis not present

## 2015-08-17 DIAGNOSIS — H401132 Primary open-angle glaucoma, bilateral, moderate stage: Secondary | ICD-10-CM | POA: Diagnosis not present

## 2015-08-17 DIAGNOSIS — Z83511 Family history of glaucoma: Secondary | ICD-10-CM | POA: Diagnosis not present

## 2015-08-31 ENCOUNTER — Encounter: Payer: Self-pay | Admitting: Medical

## 2015-08-31 ENCOUNTER — Ambulatory Visit (INDEPENDENT_AMBULATORY_CARE_PROVIDER_SITE_OTHER): Payer: Medicare Other | Admitting: Medical

## 2015-08-31 VITALS — BP 130/90 | HR 78 | Temp 98.3°F | Ht 75.0 in | Wt 236.8 lb

## 2015-08-31 DIAGNOSIS — M5441 Lumbago with sciatica, right side: Secondary | ICD-10-CM

## 2015-08-31 MED ORDER — CYCLOBENZAPRINE HCL 5 MG PO TABS: 5.0000 mg | ORAL_TABLET | Freq: Every day | ORAL | Status: DC

## 2015-08-31 MED ORDER — HYDROCODONE-ACETAMINOPHEN 5-325 MG PO TABS: 1.0000 | ORAL_TABLET | Freq: Four times a day (QID) | ORAL | Status: DC | PRN

## 2015-08-31 NOTE — Patient Instructions (Addendum)
For your back pain will rx norco and flexeril. Rx advisement given.  Back stretching exercises as tolerated.   If pain persists would get lumbar xray.  Also if pain persists could consider PT  If any severe pain, foot weakness, or foot drop(red flag symptoms as discussed )then ED evaluation.  Follow up 7-10 days or as needed

## 2015-08-31 NOTE — Progress Notes (Signed)
Subjective:    Patient ID: Vincent Walker, male    DOB: [birth year], 70 y.o.   MRN: [id number]  HPI   Pt in states has some lower back pain. He states pain started Monday afternoon after digging out a large stump. Then he went to the driving range. Pt states history of sciatica type intermittent over the years when he over exerted himself. Pt pain leve on average has been about 7.  Some pain that radiates to outside of his rt thigh. No saddle anesthesia. No leg weakness.   Pt has not taken anything for pain except tylenol.  Pt had rt kidney removed 7 years ago.  Pt has mild low gfr.   Review of Systems  Constitutional: Negative for fever, chills and fatigue.  Respiratory: Negative for cough, chest tightness, shortness of breath and wheezing.   Cardiovascular: Negative for chest pain and palpitations.  Gastrointestinal: Negative for abdominal pain.  Musculoskeletal: Positive for back pain.  Neurological: Negative for dizziness and headaches.  Hematological: Negative for adenopathy. Does not bruise/bleed easily.  Psychiatric/Behavioral: Negative for behavioral problems and confusion.    Past Medical History  Diagnosis Date  . Cancer [facility])     Kidney Cancer  . Mitral valve prolapse   . Hypertension   . Chronic kidney disease   . History of blood transfusion 2010    After Kidney surgery  . Benign essential HTN 02/08/2015  . Dyslipidemia 02/08/2015  . H/O renal cell carcinoma 02/08/2015  . Thrombocytopenia ([place]) 02/08/2015  . History of chicken pox   . Hyperlipidemia   . Tremor of right hand 02/14/2015  . Ingrown left big toenail 02/14/2015  . Chronic renal insufficiency 02/14/2015     Social History   Social History  . Marital Status: Married    Spouse Name: N/A  . Number of Children: N/A  . Years of Education: 16   Occupational History  . 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 Smoking status: Former Smoker    Types: Pipe  .  Smokeless tobacco: Not on file     Comment: stopped 20 years ago.  1996  . Alcohol Use: 0.0 oz/week    0 Standard drinks or equivalent per week     Comment: occasional  . Drug Use: No  . 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 Not on file   Social History Narrative    Past Surgical History  Procedure Laterality Date  . Appendectomy  1995  . Tonsillectomy    . Right knee scope  1999  . Left knee scope  2003  . Total knee arthroplasty Left 07/06/2014  . Total nephrectomy Right     Family History  Problem Relation Age of Onset  . Alcohol abuse Father   . Hyperlipidemia Father   . Hypertension Father   . Diabetes Father   . Heart disease Father   . Cancer Maternal Aunt     lung cancer  . Cancer Paternal Uncle     bone cancer  . Stroke Mother     brain stem at age 51    Allergies  Allergen Reactions  . Sulfa Antibiotics Other (See Comments)    Had a reaction as a child    Current Outpatient Prescriptions on File Prior to Visit  Medication Sig Dispense Refill  .  amLODipine (NORVASC) 10 MG tablet Take 10 mg by mouth daily.    Marland Kitchen aspirin 81 MG tablet Take 81 mg by mouth daily.    Marland Kitchen atenolol (TENORMIN) 50 MG tablet Take 50 mg by mouth daily.    Marland Kitchen atorvastatin (LIPITOR) 20 MG tablet Take 20 mg by mouth daily.    Marland Kitchen LORazepam (ATIVAN) 1 MG tablet Take 1 mg by mouth daily as needed for anxiety.    . Multiple Vitamins-Minerals (CENTRUM SILVER PO) Take by mouth daily.    . mupirocin ointment (BACTROBAN) 2 % Place 1 application into the nose 2 (two) times daily. 30 g 0   No current facility-administered medications on file prior to visit.    BP 130/90 mmHg  Pulse 78  Temp(Src) 98.3 F (36.8 C) (Oral)  Ht 6\' 3"  (1.905 m)  Wt 236 lb 12.8 oz (107.412 kg)  BMI 29.60 kg/m2  SpO2 98%      Objective:   Physical Exam  General Appearance- Not in acute  distress.    Chest and Lung Exam Auscultation: Breath sounds:-Normal. Clear even and unlabored. Adventitious sounds:- No Adventitious sounds.  Cardiovascular Auscultation:Rythm - Regular, rate and rythm. Heart Sounds -Normal heart sounds.  Abdomen Inspection:-Inspection Normal.  Palpation/Perucssion: Palpation and Percussion of the abdomen reveal- Non Tender, No Rebound tenderness, No rigidity(Guarding) and No Palpable abdominal masses.  Liver:-Normal.  Spleen:- Normal.   Back No mid lumbar spine tenderness to palpation. Rt si tender to palpation. Pain on straight leg lift. Pain on lateral movements and flexion/extension of the spine.  Lower ext neurologic  L5-S1 sensation intact bilaterally. Normal patellar reflexes bilaterally. No foot drop bilaterally.      Assessment & Plan:  For your back pain will rx norco and flexeril. Rx advisement given.  Back stretching exercises as tolerated.   If pain persists would get lumbar xray.  Also if pain persists could consider PT  If any severe pain, foot weakness, or foot drop(red flag symptoms as discussed )then ED evaluation.  Follow up 7-10 days or as needed  Arminda Foglio, Percell Miller, [employer]

## 2015-08-31 NOTE — Progress Notes (Signed)
Pre visit review using our clinic review tool, if applicable. No additional management support is needed unless otherwise documented below in the visit note. 

## 2015-09-01 ENCOUNTER — Ambulatory Visit: Payer: Federal, State, Local not specified - PPO | Admitting: Family Medicine

## 2015-09-05 ENCOUNTER — Ambulatory Visit (HOSPITAL_BASED_OUTPATIENT_CLINIC_OR_DEPARTMENT_OTHER)
Admission: RE | Admit: 2015-09-05 | Discharge: 2015-09-05 | Disposition: A | Discharge status: Home / Self Care | Payer: Medicare Other | Source: Ambulatory Visit | Attending: Medical | Admitting: Medical

## 2015-09-05 ENCOUNTER — Ambulatory Visit (INDEPENDENT_AMBULATORY_CARE_PROVIDER_SITE_OTHER): Payer: Federal, State, Local not specified - PPO | Admitting: Medical

## 2015-09-05 ENCOUNTER — Encounter: Payer: Self-pay | Admitting: Medical

## 2015-09-05 VITALS — BP 128/68 | HR 74 | Temp 98.0°F | Ht 75.0 in | Wt 239.4 lb

## 2015-09-05 DIAGNOSIS — M5441 Lumbago with sciatica, right side: Secondary | ICD-10-CM | POA: Diagnosis not present

## 2015-09-05 DIAGNOSIS — M47896 Other spondylosis, lumbar region: Secondary | ICD-10-CM | POA: Insufficient documentation

## 2015-09-05 DIAGNOSIS — M47816 Spondylosis without myelopathy or radiculopathy, lumbar region: Secondary | ICD-10-CM | POA: Diagnosis not present

## 2015-09-05 MED ORDER — CYCLOBENZAPRINE HCL 5 MG PO TABS: 5.0000 mg | ORAL_TABLET | Freq: Every day | ORAL | Status: DC

## 2015-09-05 NOTE — Progress Notes (Signed)
Subjective:    Patient ID: [name], male    DOB: 1946/02/20, 70 y.o.   MRN: [id number]  HPI  Pt in states he is not any better. Pt states yoga stretches helps a little bit. Medication did not help much but only made him sleepy.  Pt states he does not like to take meds for pain. In past gave him night mares. Pt states did not help much. Pt state when moving at times pain is relieved to some degree.  Pt has some pain shooting down his rt leg.  No saddle anesthesia. No leg weakness.   Pt has not taken anything for pain except tylenol.  Pt had rt kidney removed 7 years ago.  Pt has mild low gfr.   Review of Systems  Constitutional: Negative for chills and fatigue.  Respiratory: Negative for cough, chest tightness, shortness of breath and wheezing.   Cardiovascular: Negative for chest pain and palpitations.  Gastrointestinal: Negative for abdominal pain.  Musculoskeletal: Positive for back pain. Negative for myalgias and neck stiffness.  Skin: Negative for rash.  Hematological: Negative for adenopathy. Does not bruise/bleed easily.  Psychiatric/Behavioral: Negative for behavioral problems.    Past Medical History  Diagnosis Date  . Cancer [facility])     Kidney Cancer  . Mitral valve prolapse   . Hypertension   . Chronic kidney disease   . History of blood transfusion 2010    After Kidney surgery  . Benign essential HTN 02/08/2015  . Dyslipidemia 02/08/2015  . H/O renal cell carcinoma 02/08/2015  . Thrombocytopenia ([place]) 02/08/2015  . History of chicken pox   . Hyperlipidemia   . Tremor of right hand 02/14/2015  . Ingrown left big toenail 02/14/2015  . Chronic renal insufficiency 02/14/2015     Social History   Social History  . Marital Status: Married    Spouse Name: N/A  . Number of Children: N/A  . Years of Education: 16   Occupational History  . 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 Smoking status: Former Smoker    Types: Pipe   . Smokeless tobacco: Not on file     Comment: stopped 20 years ago.  1996  . Alcohol Use: 0.0 oz/week    0 Standard drinks or equivalent per week     Comment: occasional  . Drug Use: No  . 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 Not on file   Social History Narrative    Past Surgical History  Procedure Laterality Date  . Appendectomy  1995  . Tonsillectomy    . Right knee scope  1999  . Left knee scope  2003  . Total knee arthroplasty Left 07/06/2014  . Total nephrectomy Right     Family History  Problem Relation Age of Onset  . Alcohol abuse Father   . Hyperlipidemia Father   . Hypertension Father   . Diabetes Father   . Heart disease Father   . Cancer Maternal Aunt     lung cancer  . Cancer Paternal Uncle     bone cancer  . Stroke Mother     brain stem at age 31    Allergies  Allergen Reactions  . Sulfa Antibiotics Other (See Comments)    Had a reaction as a child    Current Outpatient Prescriptions on  File Prior to Visit  Medication Sig Dispense Refill  . amLODipine (NORVASC) 10 MG tablet Take 10 mg by mouth daily.    Marland Kitchen aspirin 81 MG tablet Take 81 mg by mouth daily.    Marland Kitchen atenolol (TENORMIN) 50 MG tablet Take 50 mg by mouth daily.    Marland Kitchen atorvastatin (LIPITOR) 20 MG tablet Take 20 mg by mouth daily.    . cyclobenzaprine (FLEXERIL) 5 MG tablet Take 1 tablet (5 mg total) by mouth at bedtime. 7 tablet 0  . HYDROcodone-acetaminophen (NORCO) 5-325 MG tablet Take 1 tablet by mouth every 6 (six) hours as needed for moderate pain. 12 tablet 0  . LORazepam (ATIVAN) 1 MG tablet Take 1 mg by mouth daily as needed for anxiety.    . Multiple Vitamins-Minerals (CENTRUM SILVER PO) Take by mouth daily.     No current facility-administered medications on file prior to visit.    BP 128/68 mmHg  Pulse 74  Temp(Src) 98 F (36.7 C) (Oral)  Ht 6\' 3"  (1.905  m)  Wt 239 lb 6.4 oz (108.591 kg)  BMI 29.92 kg/m2  SpO2 97%       Objective:   Physical Exam  General Appearance- Not in acute distress.    Chest and Lung Exam Auscultation: Breath sounds:-Normal. Clear even and unlabored. Adventitious sounds:- No Adventitious sounds.  Cardiovascular Auscultation:Rythm - Regular, rate and rythm. Heart Sounds -Normal heart sounds.  Abdomen Inspection:-Inspection Normal.  Palpation/Perucssion: Palpation and Percussion of the abdomen reveal- Non Tender, No Rebound tenderness, No rigidity(Guarding) and No Palpable abdominal masses.  Liver:-Normal.  Spleen:- Normal.   Back No mid lumbar spine tenderness to palpation. Rt si tender to palpation. Pain on straight leg lift. Pain on lateral movements and flexion/extension of the spine.  Lower ext neurologic  L5-S1 sensation intact bilaterally. Normal patellar reflexes bilaterally. No foot drop bilaterally.      Assessment & Plan:   Would recommend just using muscle relaxant at night since norco is not helping and can cause side effects. Use tylenol during the day.   Back stretching exercises as tolerated.   If pain persists would get lumbar xray.  Also if pain persists could consider PT  If any severe pain, foot weakness, or foot drop(red flag symptoms as discussed )then ED evaluation.  Follow up 7-10 days or as needed  Yerania Chamorro, Percell Miller, [employer]

## 2015-09-05 NOTE — Patient Instructions (Addendum)
Would recommend just using muscle relaxant at night since norco is not helping and can cause side effects. Use tylenol during the day.    Back stretching exercises as tolerated.   If pain persists would get lumbar xray.  Also if pain persists could consider PT  If any severe pain, foot weakness, or foot drop(red flag symptoms as discussed )then ED evaluation.  Follow up 7-10 days or as needed  Vincent Walker, Vincent Walker, Vincent Walker

## 2015-09-05 NOTE — Progress Notes (Signed)
Pre visit review using our clinic review tool, if applicable. No additional management support is needed unless otherwise documented below in the visit note. 

## 2015-09-07 DIAGNOSIS — H401132 Primary open-angle glaucoma, bilateral, moderate stage: Secondary | ICD-10-CM | POA: Diagnosis not present

## 2015-09-08 ENCOUNTER — Ambulatory Visit: Payer: Medicare Other | Attending: Medical | Admitting: Physical Therapy

## 2015-09-08 DIAGNOSIS — R293 Abnormal posture: Secondary | ICD-10-CM | POA: Insufficient documentation

## 2015-09-08 DIAGNOSIS — M6281 Muscle weakness (generalized): Secondary | ICD-10-CM | POA: Diagnosis not present

## 2015-09-08 DIAGNOSIS — M5441 Lumbago with sciatica, right side: Secondary | ICD-10-CM | POA: Insufficient documentation

## 2015-09-08 NOTE — Therapy (Signed)
[facility] [address]  [place], [place], [address] Phone: [phone]   Fax:  [phone]  Physical Therapy Evaluation  Patient Details  Name: Vincent Walker MRN: [id number] Date of Birth: Jul 04, 1945 Referring Provider: Mackie Pai, PA-C  Encounter Date: 09/08/2015      PT End of Session - 09/08/15 1513    Visit Number 1   Number of Visits 12   Date for PT Re-Evaluation 10/20/15   PT Start Time [id number]   PT Stop Time 1502   PT Time Calculation (min) 37 min   Activity Tolerance Patient tolerated treatment well   Behavior During Therapy [facility] for tasks assessed/performed      Past Medical History  Diagnosis Date  . Cancer [facility])     Kidney Cancer  . Mitral valve prolapse   . Hypertension   . Chronic kidney disease   . History of blood transfusion 2010    After Kidney surgery  . Benign essential HTN 02/08/2015  . Dyslipidemia 02/08/2015  . H/O renal cell carcinoma 02/08/2015  . Thrombocytopenia ([place]) 02/08/2015  . History of chicken pox   . Hyperlipidemia   . Tremor of right hand 02/14/2015  . Ingrown left big toenail 02/14/2015  . Chronic renal insufficiency 02/14/2015    Past Surgical History  Procedure Laterality Date  . Appendectomy  1995  . Tonsillectomy    . Right knee scope  1999  . Left knee scope  2003  . Total knee arthroplasty Left 07/06/2014  . Total nephrectomy Right     There were no vitals filed for this visit.       Subjective Assessment - 09/08/15 1426    Subjective Pt is a 70 y/o male who presents to OPPT with 2 wk history of LBP radiating down leg.  Pt reports he was digging up stump then went to driving range and next day woke up with pain and limited ability to move.  Pt reports 30 yr hx of intermittent LBP but it usually resolves in a few days.     Limitations Standing   How long can you sit comfortably? has some discomfort   How long can you stand comfortably? 5-10 min    Diagnostic tests x ray: negative   Patient Stated Goals improve pain, be able to play golf   Currently in Pain? Yes   Pain Score 2   up to 7/10   Pain Location Back   Pain Orientation Right;Lower   Pain Descriptors / Indicators Burning;Shooting   Pain Radiating Towards hip down to ant thigh stopping at knee   Pain Onset 1 to 4 weeks ago   Pain Frequency Constant   Aggravating Factors  standing, twisting   Pain Relieving Factors repositioning, lying flat, doing some exercises (trunk rotation, bridging, plank)            [facility] PT Assessment - 09/08/15 1432    Assessment   Medical Diagnosis LBP with sciatica   Referring Provider Mackie Pai, PA-C   Onset Date/Surgical Date --  2 wks   Next MD Visit 10 days   Prior Therapy none   Precautions   Precautions None   Restrictions   Weight Bearing Restrictions No   Balance Screen   Has the patient fallen in the past 6 months No   Has the patient had a decrease in activity level because of a fear of falling?  No   Is the patient reluctant to  leave their home because of a fear of falling?  No   Home Ecologist residence   Living Arrangements Spouse/significant other   Available Help at Discharge Family   Type of [place] Access Level entry   [place] None   Prior Function   Level of [place] Retired   Leisure play golf, take care of grandchildren (64 and 70 y/o)   Cognition   Overall Cognitive Status Within Functional Limits for tasks assessed   Observation/Other Assessments   Focus on Therapeutic Outcomes (FOTO)  44 (54% limited; predicted 34% limited)   Posture/Postural Control   Posture/Postural Control Postural limitations   Postural Limitations Decreased lumbar lordosis   AROM   Overall AROM Comments lumbar ROM WNL; mild pain with R side bending and L rotation   Strength   Strength Assessment Site Hip;Knee;Ankle   Right  Hip Flexion 5/5   Right Hip Extension 3/5   Right Hip ABduction 4-/5   Right Hip ADduction 4/5   Left Hip Flexion 5/5   Left Hip Extension 3+/5   Left Hip ABduction 4/5   Left Hip ADduction 4/5   Right/Left Knee Right;Left   Right Knee Flexion 5/5   Right Knee Extension 5/5   Left Knee Flexion 5/5   Left Knee Extension 5/5   Right Ankle Dorsiflexion 5/5   Left Ankle Dorsiflexion 5/5   Palpation   Spinal mobility hypomobility lumbar spine   SI assessment  bil SIJ level; tenderness to palpation R SIJ   Special Tests    Special Tests Lumbar   Lumbar Tests Straight Leg Raise;other   Straight Leg Raise   Findings Negative   Side  Right   other   Comments no change in symptoms with repeated prone press up   Ambulation/Gait   Gait Comments pt ambulated independently; mild decreased stance on RLE                   [facility] Adult PT Treatment/Exercise - 09/08/15 1432    Exercises   Exercises Other Exercises   Other Exercises  pt reports doing some exercises at home already: added strap with bridging for SI stabilization and stated pt okay to continue with planks - will need to assess next session                PT Education - 09/08/15 1512    Education provided Yes   Education Details continue HEP with min modifications and will update/add next session   Person(s) Educated Patient   Methods Explanation   Comprehension Verbalized understanding             PT Long Term Goals - 09/08/15 1516    PT LONG TERM GOAL #1   Title independent with HEP (10/20/15)   Time 6   Period Weeks   Status New   PT LONG TERM GOAL #2   Title perform lumbar ROM without increase in pain (10/20/15)   Time 6   Period Weeks   Status New   PT LONG TERM GOAL #3   Title demonstrate at least 4/5 strength in hip extension for improved strength and stability (10/20/15)   Time 6   Period Weeks   Status New               Plan - 09/08/15 1513    Clinical Impression  Statement Pt is a 70  y/o male who presents to [facility] for low complexity PT evaluation for LBP.  Pt reports onset of pain a few weeks ago due to over use and improper mechanics but reports improvement in symptoms over 2 weeks.  Pt demonstrates pain, decreased strength and hypomobility of lumbar spine.  Pt will benefit from PT to maximize function and decrease pain.   Rehab Potential Good   PT Frequency 2x / week   PT Duration 6 weeks   PT Treatment/Interventions ADLs/Self Care Home Management;Cryotherapy;Electrical Stimulation;Iontophoresis 4mg /ml Dexamethasone;Moist Heat;Traction;Ultrasound;Patient/family education;Neuromuscular re-education;Balance training;Therapeutic exercise;Therapeutic activities;Functional mobility training;Gait training;Manual techniques   PT Next Visit Plan SI stabilization exercises; extension based and hip ext strengthening      Patient will benefit from skilled therapeutic intervention in order to improve the following deficits and impairments:  Postural dysfunction, Pain, Hypomobility, Decreased strength  Visit Diagnosis: Right-sided low back pain with right-sided sciatica - Plan: PT plan of care cert/re-cert  Muscle weakness (generalized) - Plan: PT plan of care cert/re-cert  Abnormal posture - Plan: PT plan of care cert/re-cert      G-Codes - [id number]    Functional Assessment Tool Used FOTO 56% limited   Functional Limitation Mobility: Walking and moving around   Mobility: Walking and Moving Around Current Status [phone]) At least 40 percent but less than 60 percent impaired, limited or restricted   Mobility: Walking and Moving Around Goal Status [phone]) At least 20 percent but less than 40 percent impaired, limited or restricted       Problem List Patient Active Problem List   Diagnosis Date Noted  . Tremor of right hand 02/14/2015  . Ingrown left big toenail 02/14/2015  . Chronic renal insufficiency 02/14/2015  . Benign essential HTN 02/08/2015   . Dyslipidemia 02/08/2015  . H/O renal cell carcinoma 02/08/2015  . Thrombocytopenia ([place]) 02/08/2015  . History of chicken pox    Laureen Abrahams, PT, DPT 09/08/2015 3:20 PM  [facility] [address]  [place], [place], [address] Phone: [phone]   Fax:  [phone]  Name: Vincent Walker MRN: [id number] Date of Birth: [birth year]

## 2015-09-13 ENCOUNTER — Ambulatory Visit: Payer: Medicare Other | Admitting: Physical Therapy

## 2015-09-13 DIAGNOSIS — M6281 Muscle weakness (generalized): Secondary | ICD-10-CM | POA: Diagnosis not present

## 2015-09-13 DIAGNOSIS — R293 Abnormal posture: Secondary | ICD-10-CM

## 2015-09-13 DIAGNOSIS — M5441 Lumbago with sciatica, right side: Secondary | ICD-10-CM | POA: Diagnosis not present

## 2015-09-13 NOTE — Therapy (Signed)
[facility] [address]  [place], [place], [address] Phone: [phone]   Fax:  [phone]  Physical Therapy Treatment  Patient Details  Name: Vincent Walker MRN: [id number] Date of Birth: 01/26/1946 Referring Provider: Mackie Pai, PA-C  Encounter Date: 09/13/2015      PT End of Session - 09/13/15 1507    Visit Number 2   Number of Visits 12   Date for PT Re-Evaluation 10/20/15   PT Start Time 1430   PT Stop Time 1521   PT Time Calculation (min) 51 min   Activity Tolerance Patient tolerated treatment well   Behavior During Therapy [facility] for tasks assessed/performed      Past Medical History  Diagnosis Date  . Cancer [facility])     Kidney Cancer  . Mitral valve prolapse   . Hypertension   . Chronic kidney disease   . History of blood transfusion 2010    After Kidney surgery  . Benign essential HTN 02/08/2015  . Dyslipidemia 02/08/2015  . H/O renal cell carcinoma 02/08/2015  . Thrombocytopenia ([place]) 02/08/2015  . History of chicken pox   . Hyperlipidemia   . Tremor of right hand 02/14/2015  . Ingrown left big toenail 02/14/2015  . Chronic renal insufficiency 02/14/2015    Past Surgical History  Procedure Laterality Date  . Appendectomy  1995  . Tonsillectomy    . Right knee scope  1999  . Left knee scope  2003  . Total knee arthroplasty Left 07/06/2014  . Total nephrectomy Right     There were no vitals filed for this visit.      Subjective Assessment - 09/13/15 1432    Subjective back feels a little better than last week but still having some R sided pain.     Patient Stated Goals improve pain, be able to play golf   Currently in Pain? Yes   Pain Score 5    Pain Location Back   Pain Orientation Right;Lower   Pain Descriptors / Indicators Burning;Shooting   Pain Radiating Towards hip, thigh   Pain Onset 1 to 4 weeks ago   Pain Frequency Constant   Aggravating Factors  standing, twisting   Pain Relieving Factors repositioning, lying flat, doing some exercises (trunk rotation, bridging, plank)                         [facility] Adult PT Treatment/Exercise - 09/13/15 1435    Exercises   Exercises Lumbar   Lumbar Exercises: Stretches   Prone on Elbows Stretch 2 reps;60 seconds   Piriformis Stretch 3 reps;20 seconds   Lumbar Exercises: Aerobic   Stationary Bike L 2 x 5 min   Lumbar Exercises: Standing   Other Standing Lumbar Exercises extension x 10   Other Standing Lumbar Exercises hip extension x 10 bil with green theraband   Lumbar Exercises: Supine   Bridge 10 reps   Bridge Limitations with strap for isometric hip abdct   Other Supine Lumbar Exercises isometric hip ext RLE 10x5 sec   Lumbar Exercises: Prone   Single Arm Raise Right;Left;10 reps   Straight Leg Raise 10 reps   Opposite Arm/Leg Raise Right arm/Left leg;Left arm/Right leg;5 reps   Opposite Arm/Leg Raise Limitations mild increase in pain so added pillows under abdomen   Modalities   Modalities Traction   Traction   Type of Traction Lumbar   Min (lbs) 45   Max (lbs)  65   Hold Time 60   Rest Time 20   Time 15                     PT Long Term Goals - 09/08/15 1516    PT LONG TERM GOAL #1   Title independent with HEP (10/20/15)   Time 6   Period Weeks   Status New   PT LONG TERM GOAL #2   Title perform lumbar ROM without increase in pain (10/20/15)   Time 6   Period Weeks   Status New   PT LONG TERM GOAL #3   Title demonstrate at least 4/5 strength in hip extension for improved strength and stability (10/20/15)   Time 6   Period Weeks   Status New               Plan - 09/13/15 1507    Clinical Impression Statement Pt tolerated exercises well with only min increase in symptoms with prone exercises.  Traction performed today to see if pt has improvement in radicular symptoms.  Will conitnue to benefit from PT to maximize function.   PT Next Visit Plan cone SI  stabilization exercises; extension based and hip ext strengthening; assess response to traction and repeat if indicated   Consulted and Agree with Plan of Care Patient      Patient will benefit from skilled therapeutic intervention in order to improve the following deficits and impairments:     Visit Diagnosis: Right-sided low back pain with right-sided sciatica  Muscle weakness (generalized)  Abnormal posture     Problem List Patient Active Problem List   Diagnosis Date Noted  . Tremor of right hand 02/14/2015  . Ingrown left big toenail 02/14/2015  . Chronic renal insufficiency 02/14/2015  . Benign essential HTN 02/08/2015  . Dyslipidemia 02/08/2015  . H/O renal cell carcinoma 02/08/2015  . Thrombocytopenia ([place]) 02/08/2015  . History of chicken pox    Laureen Abrahams, PT, DPT 09/13/2015 3:22 PM  [facility] [address]  [place], [place], [address] Phone: [phone]   Fax:  [phone]  Name: SABINO EVELYN MRN: [id number] Date of Birth: 03/26/1946

## 2015-09-15 ENCOUNTER — Ambulatory Visit: Payer: Medicare Other

## 2015-09-15 DIAGNOSIS — R293 Abnormal posture: Secondary | ICD-10-CM | POA: Diagnosis not present

## 2015-09-15 DIAGNOSIS — M6281 Muscle weakness (generalized): Secondary | ICD-10-CM

## 2015-09-15 DIAGNOSIS — M5441 Lumbago with sciatica, right side: Secondary | ICD-10-CM | POA: Diagnosis not present

## 2015-09-15 NOTE — Therapy (Signed)
[facility] [address]  [place], [place], [address] Phone: [phone]   Fax:  [phone]  Physical Therapy Treatment  Patient Details  Name: Vincent Walker MRN: [id number] Date of Birth: 06/28/1945 Referring Provider: Mackie Pai, PA-C  Encounter Date: 09/15/2015      PT End of Session - 09/15/15 1536    Visit Number 3   Number of Visits 12   Date for PT Re-Evaluation 10/20/15   PT Start Time [id number]   PT Stop Time 1551   PT Time Calculation (min) 57 min   Activity Tolerance Patient tolerated treatment well   Behavior During Therapy [facility] for tasks assessed/performed      Past Medical History  Diagnosis Date  . Cancer [facility])     Kidney Cancer  . Mitral valve prolapse   . Hypertension   . Chronic kidney disease   . History of blood transfusion 2010    After Kidney surgery  . Benign essential HTN 02/08/2015  . Dyslipidemia 02/08/2015  . H/O renal cell carcinoma 02/08/2015  . Thrombocytopenia ([place]) 02/08/2015  . History of chicken pox   . Hyperlipidemia   . Tremor of right hand 02/14/2015  . Ingrown left big toenail 02/14/2015  . Chronic renal insufficiency 02/14/2015    Past Surgical History  Procedure Laterality Date  . Appendectomy  1995  . Tonsillectomy    . Right knee scope  1999  . Left knee scope  2003  . Total knee arthroplasty Left 07/06/2014  . Total nephrectomy Right     There were no vitals filed for this visit.      Subjective Assessment - 09/15/15 1623    Subjective Pt. reports he was mildy sore in the hip muscles following last treatment's exercise however feels good now.     Currently in Pain? Yes   Pain Score 3    Pain Location Back   Pain Orientation Right;Lower   Pain Descriptors / Indicators Burning;Shooting     Today's Treatment:  Therex: Recumbent bike: level 2, 4 min  B HS, piriformis, glute, SKTC x 30 sec each  Bridge x 15 reps Bridge with alternating hip abd/ER  with black TB 5 each way  Bridge with B hip/ER with black TB x 10 reps  Standing hip abduction with red TB x 10 reps each way  Standing hip extension with red TB x 10 rep each way TRX min squat x 15 min  Prone SLR hip extension with no weight x 10 reps each leg Prone on elbows x 30 sec; well tolerated  B sidelying hip abduction (no weight) x 15 reps each    Mechanical traction: Lumbar spine, neutral pull, 70#/50#, Hooklying, 20"/60", 15 min         PT Education - 09/15/15 1816    Education provided Yes   Education Details bridge with black TB around knees, bridge with alternating ER with black TB around knees, LE marching with black TB around knees, black TB issued to pt.    Person(s) Educated Patient   Methods Explanation;Verbal cues;Handout   Comprehension Verbalized understanding;Returned demonstration;Need further instruction             PT Long Term Goals - 09/15/15 1817    PT LONG TERM GOAL #1   Title independent with HEP (10/20/15)   Time 6   Period Weeks   Status On-going   PT LONG TERM GOAL #2   Title perform lumbar ROM  without increase in pain (10/20/15)   Time 6   Period Weeks   Status On-going   PT LONG TERM GOAL #3   Title demonstrate at least 4/5 strength in hip extension for improved strength and stability (10/20/15)   Time 6   Period Weeks   Status On-going               Plan - 09/15/15 1816    Clinical Impression Statement Pt. reports he was mildy sore in the hip muscles following last treatment's exercise however feels good now.  Pt. tolerated all lumbopelvic strengthening activity well with extension bias approach with most activities.  Hooklying traction with neutral pull advanced today per pt. request to 70#/50# and was well tolerated well with 0/10 LBP reported following application.      PT Treatment/Interventions ADLs/Self Care Home Management;Cryotherapy;Electrical Stimulation;Iontophoresis 4mg /ml Dexamethasone;Moist  Heat;Traction;Ultrasound;Patient/family education;Neuromuscular re-education;Balance training;Therapeutic exercise;Therapeutic activities;Functional mobility training;Gait training;Manual techniques   PT Next Visit Plan Continue traction per pt. benefit/response; Extension based and hip ext strengthening; assess response to traction and repeat if indicated      Patient will benefit from skilled therapeutic intervention in order to improve the following deficits and impairments:  Postural dysfunction, Pain, Hypomobility, Decreased strength  Visit Diagnosis: Right-sided low back pain with right-sided sciatica  Muscle weakness (generalized)  Abnormal posture     Problem List Patient Active Problem List   Diagnosis Date Noted  . Tremor of right hand 02/14/2015  . Ingrown left big toenail 02/14/2015  . Chronic renal insufficiency 02/14/2015  . Benign essential HTN 02/08/2015  . Dyslipidemia 02/08/2015  . H/O renal cell carcinoma 02/08/2015  . Thrombocytopenia ([place]) 02/08/2015  . History of chicken pox     Bess Harvest, [place] 09/15/2015, 6:26 PM  [facility] [address]  [place], [place], [address] Phone: [phone]   Fax:  [phone]  Name: Vincent Walker MRN: [id number] Date of Birth: 25-Sep-1945

## 2015-09-20 ENCOUNTER — Ambulatory Visit: Payer: Medicare Other | Admitting: Physical Therapy

## 2015-09-20 DIAGNOSIS — M5441 Lumbago with sciatica, right side: Secondary | ICD-10-CM

## 2015-09-20 DIAGNOSIS — R293 Abnormal posture: Secondary | ICD-10-CM

## 2015-09-20 DIAGNOSIS — M6281 Muscle weakness (generalized): Secondary | ICD-10-CM

## 2015-09-20 NOTE — Therapy (Signed)
[facility] [address]  [place], [place], [address] Phone: [phone]   Fax:  [phone]  Physical Therapy Treatment  Patient Details  Name: Vincent Walker MRN: [id number] Date of Birth: 1946/03/29 Referring Provider: Mackie Pai, PA-C  Encounter Date: 09/20/2015      PT End of Session - 09/20/15 1512    Visit Number 4   Number of Visits 12   Date for PT Re-Evaluation 10/20/15   PT Start Time [id number]   PT Stop Time 1520   PT Time Calculation (min) 55 min   Activity Tolerance Patient tolerated treatment well   Behavior During Therapy [facility] for tasks assessed/performed      Past Medical History  Diagnosis Date  . Cancer [facility])     Kidney Cancer  . Mitral valve prolapse   . Hypertension   . Chronic kidney disease   . History of blood transfusion 2010    After Kidney surgery  . Benign essential HTN 02/08/2015  . Dyslipidemia 02/08/2015  . H/O renal cell carcinoma 02/08/2015  . Thrombocytopenia ([place]) 02/08/2015  . History of chicken pox   . Hyperlipidemia   . Tremor of right hand 02/14/2015  . Ingrown left big toenail 02/14/2015  . Chronic renal insufficiency 02/14/2015    Past Surgical History  Procedure Laterality Date  . Appendectomy  1995  . Tonsillectomy    . Right knee scope  1999  . Left knee scope  2003  . Total knee arthroplasty Left 07/06/2014  . Total nephrectomy Right     There were no vitals filed for this visit.      Subjective Assessment - 09/20/15 1429    Subjective back has been fine yesterday and today.  experienced a burning sensation friday, saturday and sunday upon getting up and after walking for a few min it went away (thinks it may have been how he slept). has not been golfiing   Patient Stated Goals improve pain, be able to play golf   Currently in Pain? No/denies                         [facility] Adult PT Treatment/Exercise - 09/20/15 1433    Lumbar Exercises:  Stretches   Prone on Elbows Stretch 2 reps;60 seconds   Walker Ups --  10 reps; 3 seconds   Lumbar Exercises: Aerobic   Stationary Bike NuStep L 6 x 6 min   Lumbar Exercises: Standing   Functional Squats 10 reps;5 seconds   Functional Squats Limitations with TRX   Forward Lunge 5 reps;3 seconds   Forward Lunge Limitations bil with TRX   Other Standing Lumbar Exercises min cues needed with TRX exercises for technique   Lumbar Exercises: Supine   Bridge 10 reps   Bridge Limitations with hip abdct and black theraband   Lumbar Exercises: Sidelying   Clam 10 reps;5 seconds   Clam Limitations with belt for isometric resistance   Lumbar Exercises: Prone   Straight Leg Raise 10 reps   Straight Leg Raises Limitations 3# bil   Modalities   Modalities Traction   Traction   Type of Traction Lumbar   Min (lbs) 60   Max (lbs) 80   Hold Time 60   Rest Time 20   Time 15                     PT Long Term Goals - 09/15/15  1817    PT LONG TERM GOAL #1   Title independent with HEP (10/20/15)   Time 6   Period Weeks   Status On-going   PT LONG TERM GOAL #2   Title perform lumbar ROM without increase in pain (10/20/15)   Time 6   Period Weeks   Status On-going   PT LONG TERM GOAL #3   Title demonstrate at least 4/5 strength in hip extension for improved strength and stability (10/20/15)   Time 6   Period Weeks   Status On-going               Plan - 09/20/15 1512    Clinical Impression Statement Pt tolerated exercises well without increase in pain.  Pt has been pain free x 2 days; and unsure why he had so much pain in the mornings over the weekend.  Discussed trial of swinging golf clubs at driving range and pt agreeable.  Recommended to let pain be his guide and start out with limited activity progressing as tolerated.   PT Next Visit Plan Continue traction per pt. benefit/response; Extension based and hip ext strengthening; assess response to traction and repeat if  indicated      Patient will benefit from skilled therapeutic intervention in order to improve the following deficits and impairments:     Visit Diagnosis: Right-sided low back pain with right-sided sciatica  Muscle weakness (generalized)  Abnormal posture     Problem List Patient Active Problem List   Diagnosis Date Noted  . Tremor of right hand 02/14/2015  . Ingrown left big toenail 02/14/2015  . Chronic renal insufficiency 02/14/2015  . Benign essential HTN 02/08/2015  . Dyslipidemia 02/08/2015  . H/O renal cell carcinoma 02/08/2015  . Thrombocytopenia ([place]) 02/08/2015  . History of chicken pox    Laureen Abrahams, PT, DPT 09/20/2015 3:21 PM  [facility] [address]  [place], [place], [address] Phone: [phone]   Fax:  [phone]  Name: Vincent Walker MRN: [id number] Date of Birth: [birth year]

## 2015-09-22 ENCOUNTER — Ambulatory Visit: Payer: Medicare Other

## 2015-09-22 DIAGNOSIS — M5441 Lumbago with sciatica, right side: Secondary | ICD-10-CM

## 2015-09-22 DIAGNOSIS — R293 Abnormal posture: Secondary | ICD-10-CM

## 2015-09-22 DIAGNOSIS — M6281 Muscle weakness (generalized): Secondary | ICD-10-CM

## 2015-09-22 NOTE — Therapy (Signed)
[facility] [address]  [place], [place], [address] Phone: [phone]   Fax:  [phone]  Physical Therapy Treatment  Patient Details  Name: Vincent Walker MRN: [id number] Date of Birth: [birth year] Referring Provider: Mackie Pai, PA-C  Encounter Date: 09/22/2015      PT End of Session - 09/22/15 1454    Visit Number 5   Number of Visits 12   Date for PT Re-Evaluation 10/20/15   PT Start Time [id number]   PT Stop Time 1545   PT Time Calculation (min) 60 min   Activity Tolerance Patient tolerated treatment well   Behavior During Therapy [facility] for tasks assessed/performed      Past Medical History  Diagnosis Date  . Cancer [facility])     Kidney Cancer  . Mitral valve prolapse   . Hypertension   . Chronic kidney disease   . History of blood transfusion 2010    After Kidney surgery  . Benign essential HTN 02/08/2015  . Dyslipidemia 02/08/2015  . H/O renal cell carcinoma 02/08/2015  . Thrombocytopenia ([place]) 02/08/2015  . History of chicken pox   . Hyperlipidemia   . Tremor of right hand 02/14/2015  . Ingrown left big toenail 02/14/2015  . Chronic renal insufficiency 02/14/2015    Past Surgical History  Procedure Laterality Date  . Appendectomy  1995  . Tonsillectomy    . Right knee scope  1999  . Left knee scope  2003  . Total knee arthroplasty Left 07/06/2014  . Total nephrectomy Right     There were no vitals filed for this visit.      Subjective Assessment - 09/22/15 1451    Subjective Pt. reports he felt burning pain shooting down the LE's Friday, Saturday, and Sunday in the morning for a short time.  Pt. reports he has felt no pain since this in the last few days.     Patient Stated Goals improve pain, be able to play golf   Currently in Pain? No/denies   Pain Score 0-No pain   Multiple Pain Sites No       Today's Treatment:  Therex: NuStep: level 6, 4 min  B HS, piriformis, glute, SKTC x 30  sec each  SL bridge x 10 reps each side  Hooklying LE marching with black TB around knees x 10 reps Bridge with alternating hip abd/ER with black TB 10 each way  B sidelying clam shell with black TB x 10 reps  Bridge with B hip/ER with black TB x 15 reps  B standing hip abduction with green TB x 15 reps each way; 2 pole support B standing hip extension with green TB x 15 rep each way; 2 pole support Prone on elbows x 30 sec; well tolerated   Mechanical traction: Lumbar spine, neutral pull, 90#/60#, Hooklying, 20"/60", 15 min         PT Long Term Goals - 09/15/15 1817    PT LONG TERM GOAL #1   Title independent with HEP (10/20/15)   Time 6   Period Weeks   Status On-going   PT LONG TERM GOAL #2   Title perform lumbar ROM without increase in pain (10/20/15)   Time 6   Period Weeks   Status On-going   PT LONG TERM GOAL #3   Title demonstrate at least 4/5 strength in hip extension for improved strength and stability (10/20/15)   Time 6   Period Weeks  Status On-going               Plan - 09/22/15 1503    Clinical Impression Statement Pt. reports he felt burning pain shooting down the LE's Friday, Saturday, and Sunday in the morning for a short time.  Pt. reports he has felt no pain since this in the last few days.  Today treatment focused on progression of lumbopelvic strengthening and stretching activity which pt. tolerated very well without pain.  Pt. reports he felt good following last treatment's application of mechanical traction; mechanical traction to lumbar spine progressed today to 90#/60# with pt. tolerating well.      PT Treatment/Interventions ADLs/Self Care Home Management;Cryotherapy;Electrical Stimulation;Iontophoresis 4mg /ml Dexamethasone;Moist Heat;Traction;Ultrasound;Patient/family education;Neuromuscular re-education;Balance training;Therapeutic exercise;Therapeutic activities;Functional mobility training;Gait training;Manual techniques   PT Next Visit  Plan Continue traction per pt. benefit/response; Extension based and hip ext strengthening; assess response to traction and repeat if indicated      Patient will benefit from skilled therapeutic intervention in order to improve the following deficits and impairments:  Postural dysfunction, Pain, Hypomobility, Decreased strength  Visit Diagnosis: Right-sided low back pain with right-sided sciatica  Muscle weakness (generalized)  Abnormal posture     Problem List Patient Active Problem List   Diagnosis Date Noted  . Tremor of right hand 02/14/2015  . Ingrown left big toenail 02/14/2015  . Chronic renal insufficiency 02/14/2015  . Benign essential HTN 02/08/2015  . Dyslipidemia 02/08/2015  . H/O renal cell carcinoma 02/08/2015  . Thrombocytopenia ([place]) 02/08/2015  . History of chicken pox     Bess Harvest, [place] 09/22/2015, 5:25 PM  [facility] [address]  [place], [place], [address] Phone: [phone]   Fax:  [phone]  Name: Vincent Walker MRN: [id number] Date of Birth: March 14, 1946

## 2015-09-27 ENCOUNTER — Ambulatory Visit: Payer: Medicare Other | Admitting: Physical Therapy

## 2015-09-27 DIAGNOSIS — M6281 Muscle weakness (generalized): Secondary | ICD-10-CM

## 2015-09-27 DIAGNOSIS — M5441 Lumbago with sciatica, right side: Secondary | ICD-10-CM | POA: Diagnosis not present

## 2015-09-27 DIAGNOSIS — R293 Abnormal posture: Secondary | ICD-10-CM | POA: Diagnosis not present

## 2015-09-27 NOTE — Therapy (Signed)
[facility] [address]  [place], [place], [address] Phone: [phone]   Fax:  [phone]  Physical Therapy Treatment  Patient Details  Name: Vincent Walker MRN: [id number] Date of Birth: 18-Apr-1945 Referring Provider: Mackie Pai, PA-C  Encounter Date: 09/27/2015      PT End of Session - 09/27/15 1515    Visit Number 6   Number of Visits 12   Date for PT Re-Evaluation 10/20/15   PT Start Time 1430   PT Stop Time 1527   PT Time Calculation (min) 57 min   Activity Tolerance Patient tolerated treatment well   Behavior During Therapy [facility] for tasks assessed/performed      Past Medical History  Diagnosis Date  . Cancer [facility])     Kidney Cancer  . Mitral valve prolapse   . Hypertension   . Chronic kidney disease   . History of blood transfusion 2010    After Kidney surgery  . Benign essential HTN 02/08/2015  . Dyslipidemia 02/08/2015  . H/O renal cell carcinoma 02/08/2015  . Thrombocytopenia ([place]) 02/08/2015  . History of chicken pox   . Hyperlipidemia   . Tremor of right hand 02/14/2015  . Ingrown left big toenail 02/14/2015  . Chronic renal insufficiency 02/14/2015    Past Surgical History  Procedure Laterality Date  . Appendectomy  1995  . Tonsillectomy    . Right knee scope  1999  . Left knee scope  2003  . Total knee arthroplasty Left 07/06/2014  . Total nephrectomy Right     There were no vitals filed for this visit.      Subjective Assessment - 09/27/15 1434    Subjective Feels a little stiff this morning; but doing well.  Hasn't tried golfing.  No pain down legs   Patient Stated Goals improve pain, be able to play golf   Currently in Pain? No/denies                         [facility] Adult PT Treatment/Exercise - 09/27/15 1436    Lumbar Exercises: Stretches   Passive Hamstring Stretch 2 reps;20 seconds   Piriformis Stretch 2 reps;20 seconds   Lumbar Exercises: Aerobic   Stationary Bike NuStep L5 x 8 min   Lumbar Exercises: Standing   Functional Squats 10 reps;5 seconds   Functional Squats Limitations with TRX   Other Standing Lumbar Exercises hip 4 way with green theraband x 10   Lumbar Exercises: Supine   Ab Set 10 reps;5 seconds   Clam 10 reps   Clam Limitations alternating with ab set   Heel Slides 10 reps   Heel Slides Limitations alternating with ab set   Bent Knee Raise 10 reps   Bent Knee Raise Limitations alternating with ab set   Traction   Type of Traction Lumbar   Min (lbs) 60   Max (lbs) 90   Hold Time 60   Rest Time 20   Time 15                     PT Long Term Goals - 09/15/15 1817    PT LONG TERM GOAL #1   Title independent with HEP (10/20/15)   Time 6   Period Weeks   Status On-going   PT LONG TERM GOAL #2   Title perform lumbar ROM without increase in pain (10/20/15)   Time 6   Period Weeks  Status On-going   PT LONG TERM GOAL #3   Title demonstrate at least 4/5 strength in hip extension for improved strength and stability (10/20/15)   Time 6   Period Weeks   Status On-going               Plan - 09/27/15 1515    Clinical Impression Statement Pt has been pain free x 1 week and is doing well.  Hasn't tried hitting golf balls yet so recommended he try before next session.  May be ready for d/c if continues to be pain free.   PT Next Visit Plan Continue traction per pt. benefit/response; Extension based and hip ext strengthening; assess response to traction and repeat if indicated   Consulted and Agree with Plan of Care Patient      Patient will benefit from skilled therapeutic intervention in order to improve the following deficits and impairments:     Visit Diagnosis: Right-sided low back pain with right-sided sciatica  Muscle weakness (generalized)  Abnormal posture     Problem List Patient Active Problem List   Diagnosis Date Noted  . Tremor of right hand 02/14/2015  . Ingrown left  big toenail 02/14/2015  . Chronic renal insufficiency 02/14/2015  . Benign essential HTN 02/08/2015  . Dyslipidemia 02/08/2015  . H/O renal cell carcinoma 02/08/2015  . Thrombocytopenia ([place]) 02/08/2015  . History of chicken pox    Laureen Abrahams, PT, DPT 09/27/2015 3:29 PM  [facility] [address]  [place], [place], [address] Phone: [phone]   Fax:  [phone]  Name: Vincent Walker MRN: [id number] Date of Birth: 1945-08-31

## 2015-09-29 ENCOUNTER — Ambulatory Visit: Payer: Medicare Other | Admitting: Physical Therapy

## 2015-09-29 DIAGNOSIS — M6281 Muscle weakness (generalized): Secondary | ICD-10-CM | POA: Diagnosis not present

## 2015-09-29 DIAGNOSIS — M5441 Lumbago with sciatica, right side: Secondary | ICD-10-CM | POA: Diagnosis not present

## 2015-09-29 DIAGNOSIS — R293 Abnormal posture: Secondary | ICD-10-CM

## 2015-09-29 NOTE — Patient Instructions (Signed)
    Lie with hips and knees bent. Slowly inhale, and then exhale. Pull navel toward spine and tighten pelvic floor. Hold for __10_ seconds. Continue to breathe in and out during hold. Rest for _10__ seconds. Repeat __10_ times. Do __2-3_ times a day.   Copyright  VHI. All rights reserved.   Knee Fold   Lie on back, legs bent, arms by sides. Exhale, lifting knee to chest. Inhale, returning. Keep abdominals flat, navel to spine. Repeat __10__ times, alternating legs. Do __2__ sessions per day.  Copyright  VHI. All rights reserved.  Knee Drop   Keep pelvis stable. Without rotating hips, slowly drop knee to side, pause, return to center, bring knee across midline toward opposite hip. Feel obliques engaging. Repeat for ___10_ times each leg.   Copyright  VHI. All rights reserved.   Heel Slide to Straight   Slide one leg down to straight. Return. Be sure pelvis does not rock forward, tilt, rotate, or tip to side. Do _10__ times. Restabilize pelvis. Repeat with other leg. Do __1-2_ sets, __2_ times per day.  http://ss.exer.us/16   Copyright  VHI. All rights reserved.      ABDUCTION: Standing - Resistance Band (Active)   Stand, feet flat. Against green resistance band, lift right leg out to side.  Repeat with other leg. Complete _1__ sets of _10__ repetitions. Perform __2-3_ sessions per day.  ADDUCTION: Standing - Stable: Resistance Band (Active)   Stand, right leg out to side as far as possible. Against green resistance band, draw leg in across midline.  Repeat with other leg. Complete _1__ sets of __10_ repetitions. Perform __2-3_ sessions per day.  Strengthening: Hip Flexion - Resisted   With tubing around left ankle, anchor behind, bring leg forward, keeping knee straight.  Repeat with other leg.   Repeat _10___ times per set. Do __1__ sets per session. Do __2-3__ sessions per day.  Strengthening: Hip Extension - Resisted   With tubing around right ankle,  face anchor and pull leg straight back. Repeat with other leg. Repeat _10___ times per set. Do _1___ sets per session. Do _2-3___ sessions per day.   Lower Trunk Rotation Stretch    Keeping back flat and feet together, rotate knees to left side. Hold _20-30___ seconds. Repeat __2-3__ times per set. Do __1__ sets per session. Do __2-3__ sessions per day.  http://orth.exer.us/122   Copyright  VHI. All rights reserved.      If you start to develop pain down LEG again; try these exercises:  Elbow Prop (Extension)   Prop body up on elbows for __60-90__ seconds. Slowly lower it. Repeat __2-3__ times. Do __2-3__ sessions per day.  Extension   Lie face down, hands close to chest. Press trunk up, arching back. Keep neck long, shoulders down. Tighten buttocks to protect lower back. Hold __5-10__ seconds. Repeat _10___ times. Do __2-3__ sessions per day.  Backward Bend (Standing)   Arch backward to make hollow of back deeper. Hold _2-3___ seconds. Repeat __10__ times per set. Do __1__ sets per session. Do __2-3__ sessions per day.  Copyright  VHI. All rights reserved.

## 2015-09-29 NOTE — Therapy (Addendum)
[name] [address]  [place], [place], [address] Phone: [phone]   Fax:  [phone]  Physical Therapy Treatment  Patient Details  Name: Vincent Walker MRN: [id number] Date of Birth: 19-Jan-1946 Referring Provider: Mackie Pai, PA-C  Encounter Date: 09/29/2015      PT End of Session - 09/29/15 1507    Visit Number 7   PT Start Time 1443   PT Stop Time 1504   PT Time Calculation (min) 32 min   Activity Tolerance Patient tolerated treatment well   Behavior During Therapy [facility] for tasks assessed/performed      Past Medical History  Diagnosis Date  . Cancer [facility])     Kidney Cancer  . Mitral valve prolapse   . Hypertension   . Chronic kidney disease   . History of blood transfusion 2010    After Kidney surgery  . Benign essential HTN 02/08/2015  . Dyslipidemia 02/08/2015  . H/O renal cell carcinoma 02/08/2015  . Thrombocytopenia ([place]) 02/08/2015  . History of chicken pox   . Hyperlipidemia   . Tremor of right hand 02/14/2015  . Ingrown left big toenail 02/14/2015  . Chronic renal insufficiency 02/14/2015    Past Surgical History  Procedure Laterality Date  . Appendectomy  1995  . Tonsillectomy    . Right knee scope  1999  . Left knee scope  2003  . Total knee arthroplasty Left 07/06/2014  . Total nephrectomy Right     There were no vitals filed for this visit.      Subjective Assessment - 09/29/15 1433    Subjective doing well; had some "tenderness" after hitting golf balls this morning but not the same pain.     How long can you stand comfortably? 30-45 min   Patient Stated Goals improve pain, be able to play golf   Currently in Pain? No/denies            [facility] PT Assessment - 09/29/15 1504    Observation/Other Assessments   Focus on Therapeutic Outcomes (FOTO)  69 (31% limited)   AROM   Overall AROM Comments lumbar ROM WNL without pain - pt felt some tightness with R side bending  but feels this is muscular and due to hitting golf balls this morning   Strength   Right Hip Extension 4/5   Left Hip Extension 4/5                     [facility] Adult PT Treatment/Exercise - 09/29/15 1434    Self-Care   Self-Care Other Self-Care Comments   Other Self-Care Comments  provided HEP for core and hip strengthening to add to current HEP; provided extension based exercises to try if pt develops radicular symptoms again; lower trunk rotattion provided to address tightness with side bending   Lumbar Exercises: Stretches   Lower Trunk Rotation 3 reps;30 seconds   Lumbar Exercises: Aerobic   Stationary Bike NuStep L7 x 8 min   Lumbar Exercises: Standing   Other Standing Lumbar Exercises hip 4 way with green theraband x 10   Lumbar Exercises: Supine   Ab Set 10 reps;5 seconds   Clam 10 reps   Clam Limitations alternating with ab set   Heel Slides 10 reps   Heel Slides Limitations alternating with ab set   Bent Knee Raise 10 reps   Bent Knee Raise Limitations alternating with ab set  PT Education - Oct 06, 2015 1507    Education provided Yes   Education Details see self care - updated HEP   Person(s) Educated Patient   Methods Explanation;Demonstration;Handout   Comprehension Verbalized understanding;Returned demonstration;Need further instruction             PT Long Term Goals - Oct 06, 2015 1510    PT LONG TERM GOAL #1   Title independent with HEP (10/20/15)   Status Achieved   PT LONG TERM GOAL #2   Title perform lumbar ROM without increase in pain (10/20/15)   Status Achieved   PT LONG TERM GOAL #3   Title demonstrate at least 4/5 strength in hip extension for improved strength and stability (10/20/15)   Status Achieved               Plan - 2015/10/06 1507    Clinical Impression Statement Pt has met all goals and established advanced HEP.  Plan to hold x 30 days to allow for pt to return to prior level of activity and assess  response to increased activity; pt will call to return if needed.   PT Next Visit Plan hold x 30 days; d/c if no return; will need renewal and recert if pt returns      Patient will benefit from skilled therapeutic intervention in order to improve the following deficits and impairments:  Postural dysfunction, Pain, Hypomobility, Decreased strength  Visit Diagnosis: Right-sided low back pain with right-sided sciatica  Muscle weakness (generalized)  Abnormal posture       G-Codes - 2015/10/06 1511    Functional Assessment Tool Used FOTO 31% limited   Functional Limitation Mobility: Walking and moving around   Mobility: Walking and Moving Around Goal Status [phone]) At least 20 percent but less than 40 percent impaired, limited or restricted   Mobility: Walking and Moving Around Discharge Status [phone]) At least 20 percent but less than 40 percent impaired, limited or restricted      Problem List Patient Active Problem List   Diagnosis Date Noted  . Tremor of right hand 02/14/2015  . Ingrown left big toenail 02/14/2015  . Chronic renal insufficiency 02/14/2015  . Benign essential HTN 02/08/2015  . Dyslipidemia 02/08/2015  . H/O renal cell carcinoma 02/08/2015  . Thrombocytopenia ([place]) 02/08/2015  . History of chicken pox    Laureen Abrahams, PT, DPT 2015-10-06 3:12 PM  [facility] [address]  [place], [place], [address] Phone: [phone]   Fax:  [phone]  Name: Vincent Walker MRN: [id number] Date of Birth: 1945-11-09       PHYSICAL THERAPY DISCHARGE SUMMARY  Visits from Start of Care: 7  Current functional level related to goals / functional outcomes: See above; all goals met   Remaining deficits: n/a   Education / Equipment: HEP  Plan: Patient agrees to discharge.  Patient goals were met. Patient is being discharged due to meeting the stated rehab goals.  ?????     Laureen Abrahams, PT, DPT 11/11/15 7:54 AM  [facility] [place], [place] [address]  [phone] (office) [phone] (fax)

## 2015-10-03 ENCOUNTER — Ambulatory Visit: Payer: Medicare Other

## 2015-10-06 ENCOUNTER — Ambulatory Visit: Payer: Medicare Other

## 2015-10-07 ENCOUNTER — Telehealth: Payer: Self-pay | Admitting: *Deleted

## 2015-10-07 NOTE — Telephone Encounter (Signed)
Forwarded to Dr. Aletha Halim. JG//CMA

## 2015-10-18 ENCOUNTER — Encounter: Payer: Self-pay | Admitting: Medical

## 2015-10-18 ENCOUNTER — Ambulatory Visit (INDEPENDENT_AMBULATORY_CARE_PROVIDER_SITE_OTHER): Payer: Medicare Other | Admitting: Medical

## 2015-10-18 VITALS — BP 130/82 | HR 87 | Temp 98.1°F | Ht 75.0 in | Wt 235.2 lb

## 2015-10-18 DIAGNOSIS — R739 Hyperglycemia, unspecified: Secondary | ICD-10-CM

## 2015-10-18 DIAGNOSIS — E785 Hyperlipidemia, unspecified: Secondary | ICD-10-CM

## 2015-10-18 DIAGNOSIS — I10 Essential (primary) hypertension: Secondary | ICD-10-CM | POA: Diagnosis not present

## 2015-10-18 DIAGNOSIS — N289 Disorder of kidney and ureter, unspecified: Secondary | ICD-10-CM | POA: Diagnosis not present

## 2015-10-18 DIAGNOSIS — R251 Tremor, unspecified: Secondary | ICD-10-CM

## 2015-10-18 LAB — COMPREHENSIVE METABOLIC PANEL
ALBUMIN: 4.7 g/dL (ref 3.5–5.2)
ALK PHOS: 75 U/L (ref 39–117)
ALT: 26 U/L (ref 0–53)
AST: 23 U/L (ref 0–37)
BILIRUBIN TOTAL: 0.7 mg/dL (ref 0.2–1.2)
BUN: 19 mg/dL (ref 6–23)
CALCIUM: 9.8 mg/dL (ref 8.4–10.5)
CO2: 26 mEq/L (ref 19–32)
Chloride: 102 mEq/L (ref 96–112)
Creatinine, Ser: 1.49 mg/dL (ref 0.40–1.50)
GFR: 49.21 mL/min — AB (ref >60.00)
Glucose, Bld: 102 mg/dL — ABNORMAL HIGH (ref 70–99)
POTASSIUM: 4.4 meq/L (ref 3.5–5.1)
Sodium: 136 mEq/L (ref 135–145)
TOTAL PROTEIN: 7.6 g/dL (ref 6.0–8.3)

## 2015-10-18 LAB — LIPID PANEL
CHOL/HDL RATIO: 3
CHOLESTEROL: 125 mg/dL (ref 0–200)
HDL: 47.9 mg/dL (ref >39.00)
LDL Cholesterol: 47 mg/dL (ref 0–99)
NonHDL: 77.07
TRIGLYCERIDES: 149 mg/dL (ref 0.0–149.0)
VLDL: 29.8 mg/dL (ref 0.0–40.0)

## 2015-10-18 LAB — HEMOGLOBIN A1C: HEMOGLOBIN A1C: 5.9 % (ref 4.6–6.5)

## 2015-10-18 NOTE — Progress Notes (Signed)
Pre visit review using our clinic review tool, if applicable. No additional management support is needed unless otherwise documented below in the visit note. 

## 2015-10-18 NOTE — Patient Instructions (Addendum)
For your htn history continue current med regimen. But if you start to see bp levels systolic lower than [id number] and diastolic lower than 70 then cut back on amlodipine to 5 mg.  For hand tremor will refer to neurologist.  For depression will rx sertraline low dose. Will see how you feel over next month.  Will get cmp, a1c and lipid panel today.  Follow up in 1 month or as needed

## 2015-10-18 NOTE — Progress Notes (Signed)
Subjective:    Patient ID: Vincent Walker, male    DOB: 1946/02/05, 70 y.o.   MRN: [id number]  HPI  Pt in for some report of some low bp 106/66 on occasion. He states his bp was this low for about 2-3 weeks in a row. When he was walking felt weak. Bending over felt light headed. Pt stopped amlodipine 10 mg for about a week. His bp came up. He restarted amlodipine and his bp did not drop again. This am was 126/76.  Wife states that one systolic bp was 96 during above low bp events.   Pt recently has been walking. Gradually increasing his endurance. Wife trying to encourage him.  Pt also has faint rt hand tremor Wife thinks little worse over last year. No left side tremble. No rigid upper ext. Wife thinks mild shuffel gate. Lt knee replacement. Rt side needs replacement.   Often more tense and nervous he does not notice rt side trembling.  Sad, depressed, not motivated since sept. But worse since march. Close friend of his passed away. He is willing to try med for depression.     Review of Systems  Constitutional: Negative for fever, chills and fatigue.  Respiratory: Negative for cough, choking, shortness of breath and wheezing.   Cardiovascular: Negative for chest pain and palpitations.  Musculoskeletal: Negative for back pain.  Skin: Negative for rash.  Neurological: Negative for dizziness, syncope, speech difficulty, weakness, numbness and headaches.       Rt hand tremor.  Hematological: Negative for adenopathy. Does not bruise/bleed easily.  Psychiatric/Behavioral: Positive for dysphoric mood. Negative for suicidal ideas, behavioral problems, confusion and self-injury. The patient is not nervous/anxious.    Past Medical History  Diagnosis Date  . Cancer [facility])     Kidney Cancer  . Mitral valve prolapse   . Hypertension   . Chronic kidney disease   . History of blood transfusion 2010    After Kidney surgery  . Benign essential HTN 02/08/2015  . Dyslipidemia 02/08/2015  . H/O  renal cell carcinoma 02/08/2015  . Thrombocytopenia ([facility]) 02/08/2015  . History of chicken pox   . Hyperlipidemia   . Tremor of right hand 02/14/2015  . Ingrown left big toenail 02/14/2015  . Chronic renal insufficiency 02/14/2015     Social History   Social History  . Marital Status: Married    Spouse Name: N/A  . Number of Children: N/A  . Years of Education: 16   Occupational History  . retired Freight forwarder in [place]  . Smoking status: Former Smoker    Types: Pipe  . Smokeless tobacco: Not on file     Comment: stopped 20 years ago.  1996  . Alcohol Use: 0.0 oz/week    0 Standard drinks or equivalent per week     Comment: occasional  . Drug Use: No  . 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 Not on file   Social History Narrative    Past Surgical History  Procedure Laterality Date  . Appendectomy  1995  . Tonsillectomy    . Right knee scope  1999  . Left knee scope  2003  . Total knee arthroplasty Left 07/06/2014  . Total nephrectomy Right     Family History  Problem Relation Age of Onset  . Alcohol abuse  Father   . Hyperlipidemia Father   . Hypertension Father   . Diabetes Father   . Heart disease Father   . Cancer Maternal Aunt     lung cancer  . Cancer Paternal Uncle     bone cancer  . Stroke Mother     brain stem at age 19    Allergies  Allergen Reactions  . Sulfa Antibiotics Other (See Comments)    Had a reaction as a child    Current Outpatient Prescriptions on File Prior to Visit  Medication Sig Dispense Refill  . amLODipine (NORVASC) 10 MG tablet Take 10 mg by mouth daily.    Marland Kitchen aspirin 81 MG tablet Take 81 mg by mouth daily.    Marland Kitchen atenolol (TENORMIN) 50 MG tablet Take 50 mg by mouth daily.    Marland Kitchen atorvastatin (LIPITOR) 20 MG tablet Take 20 mg by mouth daily.    Marland Kitchen latanoprost (XALATAN)  0.005 % ophthalmic solution PLACE 1 DROP INTO BOTH EYES NIGHTLY.    . LORazepam (ATIVAN) 1 MG tablet Take 1 mg by mouth daily as needed for anxiety.    . Multiple Vitamins-Minerals (CENTRUM SILVER PO) Take by mouth daily.     No current facility-administered medications on file prior to visit.    BP 130/82 mmHg  Pulse 87  Temp(Src) 98.1 F (36.7 C) (Oral)  Ht 6\' 3"  (1.905 m)  Wt 235 lb 3.2 oz (106.686 kg)  BMI 29.40 kg/m2  SpO2 98%       Objective:   Physical Exam   General Mental Status- Alert. General Appearance- Not in acute distress.   Skin General: Color- Normal Color. Moisture- Normal Moisture.  Neck Carotid Arteries- Normal color. Moisture- Normal Moisture. No carotid bruits. No JVD.  Chest and Lung Exam Auscultation: Breath Sounds:-Normal.  Cardiovascular Auscultation:Rythm- Regular. Murmurs & Other Heart Sounds:Auscultation of the heart reveals- No Murmurs.  Abdomen Inspection:-Inspeection Normal. Palpation/Percussion:Note:No mass. Palpation and Percussion of the abdomen reveal- Non Tender, Non Distended + BS, no rebound or guarding.   Neurologic Cranial Nerve exam:- CN III-XII intact(No nystagmus), symmetric smile. Drift Test:- No drift. Finger to Nose:- Normal/Intact Strength:- 5/5 equal and symmetric strength both upper and lower extremities.  Rt hand- mild-moderte upper ext mild tremor.        Assessment & Plan:  For your htn history continue current med regimen. But if you start to see bp levels systolic lower than [id number] and diastolic lower than 70  then cut back on amlodipine to 5 mg.  For hand tremor will refer to neurologist.  For depression will rx sertraline low dose. Will see how you feel over next month.  Follow up in 1 month or as needed  Will get cmp, a1c and lipid panel today.  Ramie Palladino, Percell Miller, PA-C

## 2015-10-20 ENCOUNTER — Encounter: Payer: Self-pay | Admitting: Medical

## 2015-10-20 ENCOUNTER — Other Ambulatory Visit: Payer: Self-pay | Admitting: Medical

## 2015-10-20 MED ORDER — SERTRALINE HCL 25 MG PO TABS: 25.0000 mg | ORAL_TABLET | Freq: Every day | ORAL | Status: DC

## 2015-10-20 NOTE — Telephone Encounter (Signed)
Rx of sertraline sent to pt pharmacy. Please notify pt.

## 2015-10-20 NOTE — Addendum Note (Signed)
Addended by: Tasia Catchings on: 10/20/2015 03:00 PM   Modules accepted: SmartSet

## 2015-10-20 NOTE — Telephone Encounter (Signed)
Spoke with pt and advised him that the Rx was at pharmacy for him. Pt was advised to follow up with Dr. Charlett Blake or Shana Chute Saguier in 1 month.

## 2015-11-18 ENCOUNTER — Other Ambulatory Visit (INDEPENDENT_AMBULATORY_CARE_PROVIDER_SITE_OTHER): Payer: Medicare Other

## 2015-11-18 ENCOUNTER — Encounter: Payer: Self-pay | Admitting: Neurology

## 2015-11-18 ENCOUNTER — Ambulatory Visit (INDEPENDENT_AMBULATORY_CARE_PROVIDER_SITE_OTHER): Payer: Medicare Other | Admitting: Neurology

## 2015-11-18 VITALS — BP 130/82 | HR 80 | Ht 75.0 in | Wt 238.0 lb

## 2015-11-18 DIAGNOSIS — G2581 Restless legs syndrome: Secondary | ICD-10-CM

## 2015-11-18 DIAGNOSIS — Z85528 Personal history of other malignant neoplasm of kidney: Secondary | ICD-10-CM

## 2015-11-18 DIAGNOSIS — F330 Major depressive disorder, recurrent, mild: Secondary | ICD-10-CM

## 2015-11-18 DIAGNOSIS — G20 Parkinson's disease: Secondary | ICD-10-CM

## 2015-11-18 DIAGNOSIS — R292 Abnormal reflex: Secondary | ICD-10-CM

## 2015-11-18 DIAGNOSIS — R251 Tremor, unspecified: Secondary | ICD-10-CM | POA: Diagnosis not present

## 2015-11-18 LAB — IBC PANEL
Iron: 71 ug/dL (ref 42–165)
SATURATION RATIOS: 20.7 % (ref 20.0–50.0)
Transferrin: 245 mg/dL (ref 212.0–360.0)

## 2015-11-18 LAB — TSH: TSH: 1.29 u[IU]/mL (ref 0.35–4.50)

## 2015-11-18 LAB — VITAMIN B12: VITAMIN B 12: 886 pg/mL (ref 211–911)

## 2015-11-18 MED ORDER — CARBIDOPA-LEVODOPA 25-100 MG PO TABS: 1.0000 | ORAL_TABLET | Freq: Three times a day (TID) | ORAL | 3 refills | Status: DC

## 2015-11-18 NOTE — Patient Instructions (Signed)
1. Your provider has requested that you have labwork completed today. Please go to [facility] ([id number]) on the second floor of this building before leaving the office today. You do not need to check in. If you are not called within 15 minutes please check with the front desk.   2. We have sent a referral to [facility] for your MRI and they will call you directly to schedule your appt. They are located at [place]. If you need to contact them directly please call [phone].  3. Start Carbidopa Levodopa as follows:  Take 1/2 tablet three times daily, at least 30 minutes before meals, for one week  Then take 1/2 tablet in the morning, 1/2 tablet in the afternoon, 1 tablet in the evening, at least 30 minutes before meals, for one week  Then take 1/2 tablet in the morning, 1 tablet in the afternoon, 1 tablet in the evening, at least 30 minutes before meals, for one week  Then take 1 tablet three times daily, at least 30 minutes before meals

## 2015-11-18 NOTE — Progress Notes (Signed)
Vincent Walker was seen today in the movement disorders clinic for neurologic consultation at the request of [name], MD.  The consultation is for the evaluation of R hand tremor and shuffling.  This patient is accompanied in the office by his spouse who supplements the history.   Specific Symptoms:  Tremor: Yes.   x 1+ year, R hand at rest, increased with anxiety Family hx of similar:  Yes.   (grandmother with tremor) Voice: more "raspy" per wife Sleep: trouble getting to sleep but does have RLS prior to getting to sleep  Vivid Dreams:  No.  Acting out dreams:  No Wet Pillows: No. Postural symptoms:  No.  Falls?  No. Bradykinesia symptoms: slow movements and stride length is shorter Loss of smell:  No. Loss of taste:  No. Urinary Incontinence:  No. Difficulty Swallowing:  No. Handwriting, micrographia: Yes.   (R hand dominant) Trouble with ADL's:  No.  Trouble buttoning clothing: No. Depression:  Yes.   (started on the generic for zoloft but only took for 3 days and thought that made more depressed) Memory changes:  No. Hallucinations:  No.  visual distortions: Yes.   N/V:  No. Lightheaded:  Yes.    Syncope: No. Diplopia:  No. Dyskinesia:  No.  Neuroimaging has not previously been performed.    PREVIOUS MEDICATIONS: none to date  ALLERGIES:   Allergies  Allergen Reactions  . Sulfa Antibiotics Other (See Comments)    Had a reaction as a child    CURRENT MEDICATIONS:  Outpatient Encounter Prescriptions as of 11/18/2015  Medication Sig  . amLODipine (NORVASC) 10 MG tablet Take 10 mg by mouth daily.  Marland Kitchen aspirin 81 MG tablet Take 81 mg by mouth daily.  Marland Kitchen atenolol (TENORMIN) 50 MG tablet Take 50 mg by mouth daily.  Marland Kitchen atorvastatin (LIPITOR) 20 MG tablet Take 20 mg by mouth daily.  Marland Kitchen latanoprost (XALATAN) 0.005 % ophthalmic solution PLACE 1 DROP INTO BOTH EYES NIGHTLY.  . LORazepam (ATIVAN) 1 MG tablet Take 1 mg by mouth daily as needed for anxiety.  . Multiple  Vitamins-Minerals (CENTRUM SILVER PO) Take by mouth daily.  . [DISCONTINUED] sertraline (ZOLOFT) 25 MG tablet Take 1 tablet (25 mg total) by mouth daily.   No facility-administered encounter medications on file as of 11/18/2015.     PAST MEDICAL HISTORY:   Past Medical History:  Diagnosis Date  . Benign essential HTN 02/08/2015  . Cancer [facility])    Kidney Cancer  . Chronic kidney disease   . Chronic renal insufficiency 02/14/2015  . Dyslipidemia 02/08/2015  . H/O renal cell carcinoma 02/08/2015  . History of blood transfusion 2010   After Kidney surgery  . History of chicken pox   . Hyperlipidemia   . Hypertension   . Ingrown left big toenail 02/14/2015  . Mitral valve prolapse   . Thrombocytopenia ([place]) 02/08/2015  . Tremor of right hand 02/14/2015    PAST SURGICAL HISTORY:   Past Surgical History:  Procedure Laterality Date  . APPENDECTOMY  1995  . left knee scope  2003  . right knee scope  1999  . TONSILLECTOMY    . TOTAL KNEE ARTHROPLASTY Left 07/06/2014  . TOTAL NEPHRECTOMY Right     SOCIAL HISTORY:   Social History   Social History  . Marital status: Married    Spouse name: N/A  . Number of children: N/A  . Years of education: 67   Occupational History  . retired Freight forwarder in [employer]    Social History Main Topics  . Smoking status: Former Smoker    Types: Pipe    Quit date: 11/18/1995  . Smokeless tobacco: Never Used     Comment: stopped 20 years ago.  1996  . Alcohol use 0.0 oz/week     Comment: once a week   . Drug use: No  . 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 Not on file   Social History Narrative  . No narrative on file    FAMILY HISTORY:   Family Status  Relation Status  . Father Deceased  . Maternal Aunt Deceased  . Paternal Uncle Deceased  . Mother Deceased  . Daughter Alive  . Son Alive  . Maternal  Grandmother Deceased at age 1  . Maternal Grandfather Deceased at age 75  . Paternal Grandmother Deceased at age 19  . Paternal Grandfather Deceased at age 32  . Daughter Alive    ROS:  A complete 10 system review of systems was obtained and was unremarkable apart from what is mentioned above.  PHYSICAL EXAMINATION:    VITALS:   Vitals:   11/18/15 1341  BP: 130/82  Pulse: 80  Weight: 238 lb (108 kg)  Height: 6\' 3"  (1.905 m)    GEN:  The patient appears stated age and is in NAD. HEENT:  Normocephalic, atraumatic.  The mucous membranes are moist. The superficial temporal arteries are without ropiness or tenderness. CV:  RRR Lungs:  CTAB Neck/HEME:  There are no carotid bruits bilaterally.  Neurological examination:  Orientation: The patient is alert and oriented x3. Fund of knowledge is appropriate.  Recent and remote memory are intact.  Attention and concentration are normal.    Able to name objects and repeat phrases. Cranial nerves: There is good facial symmetry. There is facial hypomimia.  Pupils are equal round and reactive to light bilaterally. Fundoscopic exam reveals clear margins bilaterally. Extraocular muscles are intact. The visual fields are full to confrontational testing. The speech is fluent and clear. Soft palate rises symmetrically and there is no tongue deviation. Hearing is slightly decreased to conversational tone. Sensation: Sensation is intact to light and pinprick throughout (facial, trunk, extremities). Vibration is intact at the bilateral big toe. There is no extinction with double simultaneous stimulation. There is no sensory dermatomal level identified. Motor: Strength is 5/5 in the bilateral upper and lower extremities.   Shoulder shrug is equal and symmetric.  There is no pronator drift. Deep tendon reflexes: Deep tendon reflexes are 2+-3-/4 at the bilateral biceps, triceps, brachioradialis, patella and achilles. Plantar responses are downgoing  bilaterally.  Movement examination: Tone: There is increased tone in the RUE/RLE, overall moderate.  Tone on the LUE/LLE is normal.  Abnormal movements: There is a near constant RUE resting tremor Coordination:  There is decremation with RAM's, with all form of RAMS, including alternating supination and pronation of the forearm, hand opening and closing, finger taps on the right.  There is decreased alternation supination/pronation of the forearm on the L but all other RAMs on the L are normal.    Gait and Station: The patient has no difficulty arising out of a deep-seated chair without the use of the hands. The patient's stride length is normal with decreased arm swing on the right.  The patient has a negative pull test.  LABS    Chemistry      Component Value Date/Time   NA 136 10/18/2015 1233   K 4.4 10/18/2015 1233   CL 102 10/18/2015 1233   CO2 26 10/18/2015 1233   BUN 19 10/18/2015 1233   CREATININE 1.49 10/18/2015 1233      Component Value Date/Time   CALCIUM 9.8 10/18/2015 1233   ALKPHOS 75 10/18/2015 1233   AST 23 10/18/2015 1233   ALT 26 10/18/2015 1233   BILITOT 0.7 10/18/2015 1233     Lab Results  Component Value Date   WBC 9.3 02/08/2015   HGB 15.6 02/08/2015   HCT 46.8 02/08/2015   MCV 90.6 02/08/2015   PLT 184.0 02/08/2015   No results found for: VITAMINB12   No results found for: IRON, TIBC, FERRITIN  Lab Results  Component Value Date   TSH 1.74 02/08/2015     ASSESSMENT/PLAN:  1.  Idiopathic Parkinson's disease.  He is Hoehn and Yoehr stage 1.  This is a new dx on 11/18/15. The patient has tremor, bradykinesia, rigidity and mild postural instability.  -We discussed the diagnosis as well as pathophysiology of the disease.  We discussed treatment options as well as prognostic indicators.  Patient education was provided.  -Greater than 50% of the 60 minute visit was spent in counseling answering questions and talking about what to expect now as well  as in the future.  We talked about medication options as well as potential future surgical options.  We talked about safety in the home.  -We decided to add carbidopa/levodopa 25/100.  1/2 tab tid x 1 wk, then 1/2 in am & noon & 1 at night for a week, then 1/2 in am &1 at noon &night for a week, then 1 po tid.  Risks, benefits, side effects and alternative therapies were discussed.  The opportunity to ask questions was given and they were answered to the best of my ability.  The patient expressed understanding and willingness to follow the outlined treatment protocols.  -We discussed community resources in the area including patient support groups and community exercise programs for PD and pt education was provided to the patient.  -given his hx of renal cell CA and hyperreflexia, we will do an MRI of the brain with and without gad  2.  RLS  -The levodopa may help with this or we may need to add carbidopa/levodopa CR at nighttime but will need to see how he does.  3.  Depression  -Can be associated with Parkinson's disease.  This may be helped with the addition of levodopa, but often times is not.  Decided to go ahead and start levodopa, but if does not help, he may be willing to restart an antidepressant.  Told him that cognitive behavioral therapy may be of value as well.  4.  We will follow-up with him in the next few months, sooner should new neurologic issues arise.

## 2015-11-21 ENCOUNTER — Telehealth: Payer: Self-pay | Admitting: Neurology

## 2015-11-21 NOTE — Telephone Encounter (Signed)
-----   Message from [place], DO sent at 11/19/2015  1:16 PM EDT ----- Can let pt know that labs look fine

## 2015-11-21 NOTE — Telephone Encounter (Signed)
Mychart message sent to patient.

## 2015-11-30 ENCOUNTER — Ambulatory Visit
Admission: RE | Admit: 2015-11-30 | Discharge: 2015-11-30 | Disposition: A | Discharge status: Home / Self Care | Payer: Medicare Other | Source: Ambulatory Visit | Attending: Neurology | Admitting: Neurology

## 2015-11-30 DIAGNOSIS — G20 Parkinson's disease: Secondary | ICD-10-CM | POA: Diagnosis not present

## 2015-11-30 DIAGNOSIS — Z85528 Personal history of other malignant neoplasm of kidney: Secondary | ICD-10-CM

## 2015-11-30 DIAGNOSIS — R292 Abnormal reflex: Secondary | ICD-10-CM

## 2015-11-30 MED ORDER — GADOBENATE DIMEGLUMINE 529 MG/ML IV SOLN
20.0000 mL | Freq: Once | INTRAVENOUS | Status: AC | PRN
  Administered 2015-11-30: 20 mL via INTRAVENOUS

## 2015-12-01 ENCOUNTER — Telehealth: Payer: Self-pay | Admitting: Neurology

## 2015-12-01 NOTE — Telephone Encounter (Signed)
Patient's wife made aware.

## 2015-12-01 NOTE — Telephone Encounter (Signed)
-----   Message from [place], DO sent at 12/01/2015  7:30 AM EDT ----- You can let pt know that brain looks good

## 2015-12-23 ENCOUNTER — Encounter: Payer: Self-pay | Admitting: Family Medicine

## 2015-12-23 ENCOUNTER — Ambulatory Visit (INDEPENDENT_AMBULATORY_CARE_PROVIDER_SITE_OTHER): Payer: Medicare Other | Admitting: Family Medicine

## 2015-12-23 DIAGNOSIS — R739 Hyperglycemia, unspecified: Secondary | ICD-10-CM

## 2015-12-23 DIAGNOSIS — I10 Essential (primary) hypertension: Secondary | ICD-10-CM

## 2015-12-23 DIAGNOSIS — F411 Generalized anxiety disorder: Secondary | ICD-10-CM

## 2015-12-23 DIAGNOSIS — G20 Parkinson's disease: Secondary | ICD-10-CM

## 2015-12-23 DIAGNOSIS — Z23 Encounter for immunization: Secondary | ICD-10-CM

## 2015-12-23 DIAGNOSIS — K5900 Constipation, unspecified: Secondary | ICD-10-CM | POA: Insufficient documentation

## 2015-12-23 DIAGNOSIS — E785 Hyperlipidemia, unspecified: Secondary | ICD-10-CM

## 2015-12-23 DIAGNOSIS — G20A1 Parkinson's disease without dyskinesia, without mention of fluctuations: Secondary | ICD-10-CM

## 2015-12-23 HISTORY — DX: Parkinson's disease: G20

## 2015-12-23 HISTORY — DX: Parkinson's disease without dyskinesia, without mention of fluctuations: G20.A1

## 2015-12-23 HISTORY — DX: Hyperglycemia, unspecified: R73.9

## 2015-12-23 HISTORY — DX: Generalized anxiety disorder: F41.1

## 2015-12-23 HISTORY — DX: Constipation, unspecified: K59.00

## 2015-12-23 MED ORDER — ATENOLOL 50 MG PO TABS: 50.0000 mg | ORAL_TABLET | Freq: Every day | ORAL | 1 refills | Status: DC

## 2015-12-23 MED FILL — ATENOLOL 50 MG TABLET: 50 | 90 days supply | Qty: 90 | Fill #0

## 2015-12-23 NOTE — Progress Notes (Signed)
Patient ID: Vincent Walker, male   DOB: 04-25-1945, 70 y.o.   MRN: [id number]   Subjective:    Patient ID: Vincent Walker, male    DOB: 1945/06/11, 70 y.o.   MRN: [id number]  Chief Complaint  Patient presents with  . Follow-up    HPI Patient is in today for follow up. Feeling well has officially been diagnosed with parkinson's disease by neurology but is stable and still only notes symptoms in right hand. No recent illness. Denies CP/palp/SOB/HA/congestion/fevers/GI or GU c/o. Taking meds as prescribed  Past Medical History:  Diagnosis Date  . Anxiety state 12/23/2015  . Benign essential HTN 02/08/2015  . Cancer [facility])    Kidney Cancer  . Chronic kidney disease   . Chronic renal insufficiency 02/14/2015  . Constipation 12/23/2015  . Dyslipidemia 02/08/2015  . H/O renal cell carcinoma 02/08/2015  . History of blood transfusion 2010   After Kidney surgery  . History of chicken pox   . Hyperglycemia 12/23/2015  . Hyperlipidemia   . Hypertension   . Ingrown left big toenail 02/14/2015  . Mitral valve prolapse   . Parkinson disease ([place]) 12/23/2015  . Thrombocytopenia ([place]) 02/08/2015  . Tremor of right hand 02/14/2015    Past Surgical History:  Procedure Laterality Date  . APPENDECTOMY  1995  . left knee scope  2003  . right knee scope  1999  . TONSILLECTOMY    . TOTAL KNEE ARTHROPLASTY Left 07/06/2014  . TOTAL NEPHRECTOMY Right     Family History  Problem Relation Age of Onset  . Alcohol abuse Father   . Hyperlipidemia Father   . Hypertension Father   . Diabetes Father   . Heart disease Father   . Cancer Maternal Aunt     lung cancer  . Cancer Paternal Uncle     bone cancer  . Heart attack Mother   . Stroke Mother     swelling in brain stem    Social History   Social History  . Marital status: Married    Spouse name: N/A  . Number of children: N/A  . Years of education: 40   Occupational History  . 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 Smoking status: Former Smoker    Types: Pipe    Quit date: 11/18/1995  . Smokeless tobacco: Never Used     Comment: stopped 20 years ago.  1996  . Alcohol use 0.0 oz/week     Comment: once a week   . Drug use: No  . 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 Not on file   Social History Narrative  . No narrative on file    Outpatient Medications Prior to Visit  Medication Sig Dispense Refill  . amLODipine (NORVASC) 10 MG tablet Take 10 mg by mouth daily.    Marland Kitchen aspirin 81 MG tablet Take 81 mg by mouth daily.    Marland Kitchen atorvastatin (LIPITOR) 20 MG tablet Take 20 mg by mouth daily.    . carbidopa-levodopa (SINEMET IR) 25-100 MG tablet Take 1 tablet by mouth 3 (three) times daily. 90 tablet 3  . latanoprost (XALATAN) 0.005 % ophthalmic solution PLACE 1 DROP INTO BOTH EYES NIGHTLY.    . LORazepam (ATIVAN) 1 MG tablet Take 1 mg by mouth daily as needed for anxiety.    Marland Kitchen  Multiple Vitamins-Minerals (CENTRUM SILVER PO) Take by mouth daily.    Marland Kitchen atenolol (TENORMIN) 50 MG tablet Take 50 mg by mouth daily.     No facility-administered medications prior to visit.     Allergies  Allergen Reactions  . Sulfa Antibiotics Other (See Comments)    Had a reaction as a child    Review of Systems  Constitutional: Negative for fever and malaise/fatigue.  HENT: Negative for congestion.   Eyes: Negative for blurred vision.  Respiratory: Negative for shortness of breath.   Cardiovascular: Negative for chest pain, palpitations and leg swelling.  Gastrointestinal: Negative for abdominal pain, blood in stool and nausea.  Genitourinary: Negative for dysuria and frequency.  Musculoskeletal: Negative for falls.  Skin: Negative for rash.  Neurological: Positive for tremors. Negative for dizziness, loss of consciousness and headaches.  Endo/Heme/Allergies: Negative for environmental  allergies.  Psychiatric/Behavioral: Negative for depression. The patient is not nervous/anxious.        Objective:    Physical Exam  Constitutional: He is oriented to person, place, and time. He appears well-developed and well-nourished. No distress.  HENT:  Head: Normocephalic and atraumatic.  Nose: Nose normal.  Eyes: Right eye exhibits no discharge. Left eye exhibits no discharge.  Neck: Normal range of motion. Neck supple.  Cardiovascular: Normal rate and regular rhythm.   No murmur heard. Pulmonary/Chest: Effort normal and breath sounds normal.  Abdominal: Soft. Bowel sounds are normal. There is no tenderness.  Musculoskeletal: He exhibits no edema.  Neurological: He is alert and oriented to person, place, and time.  Right hand tremor  Skin: Skin is warm and dry.  Psychiatric: He has a normal mood and affect.  Nursing note and vitals reviewed.   BP 132/78 (BP Location: Left Arm, Patient Position: Sitting, Cuff Size: Large)   Pulse 69   Temp 98.3 F (36.8 C) (Oral)   Ht 6\' 3"  (1.905 m)   Wt 238 lb 8 oz (108.2 kg)   SpO2 94%   BMI 29.81 kg/m  Wt Readings from Last 3 Encounters:  12/23/15 238 lb 8 oz (108.2 kg)  11/18/15 238 lb (108 kg)  10/18/15 235 lb 3.2 oz (106.7 kg)     Lab Results  Component Value Date   WBC 9.3 02/08/2015   HGB 15.6 02/08/2015   HCT 46.8 02/08/2015   PLT 184.0 02/08/2015   GLUCOSE 102 (H) 10/18/2015   CHOL 125 10/18/2015   TRIG 149.0 10/18/2015   HDL 47.90 10/18/2015   LDLCALC 47 10/18/2015   ALT 26 10/18/2015   AST 23 10/18/2015   NA 136 10/18/2015   K 4.4 10/18/2015   CL 102 10/18/2015   CREATININE 1.49 10/18/2015   BUN 19 10/18/2015   CO2 26 10/18/2015   TSH 1.29 11/18/2015   HGBA1C 5.9 10/18/2015    Lab Results  Component Value Date   TSH 1.29 11/18/2015   Lab Results  Component Value Date   WBC 9.3 02/08/2015   HGB 15.6 02/08/2015   HCT 46.8 02/08/2015   MCV 90.6 02/08/2015   PLT 184.0 02/08/2015   Lab Results   Component Value Date   NA 136 10/18/2015   K 4.4 10/18/2015   CO2 26 10/18/2015   GLUCOSE 102 (H) 10/18/2015   BUN 19 10/18/2015   CREATININE 1.49 10/18/2015   BILITOT 0.7 10/18/2015   ALKPHOS 75 10/18/2015   AST 23 10/18/2015   ALT 26 10/18/2015   PROT 7.6 10/18/2015   ALBUMIN 4.7 10/18/2015   CALCIUM  9.8 10/18/2015   GFR 49.21 (L) 10/18/2015   Lab Results  Component Value Date   CHOL 125 10/18/2015   Lab Results  Component Value Date   HDL 47.90 10/18/2015   Lab Results  Component Value Date   LDLCALC 47 10/18/2015   Lab Results  Component Value Date   TRIG 149.0 10/18/2015   Lab Results  Component Value Date   CHOLHDL 3 10/18/2015   Lab Results  Component Value Date   HGBA1C 5.9 10/18/2015       Assessment & Plan:   Problem List Items Addressed This Visit    Benign essential HTN    Well controlled, no changes to meds. Encouraged heart healthy diet such as the DASH diet and exercise as tolerated.       Relevant Medications   atenolol (TENORMIN) 50 MG tablet   Dyslipidemia    Encouraged heart healthy diet, increase exercise, avoid trans fats, consider a krill oil cap daily      Parkinson disease ([place])    Recently diagnosed and has increased exercise and is doing well, tremor is minor in right hand and arm and is not progressing obviously      Constipation    Encouraged increased hydration and fiber in diet. Daily probiotics. If bowels not moving can use MOM 2 tbls po in 4 oz of warm prune juice by mouth every 2-3 days. If no results then repeat in 4 hours with  Dulcolax suppository pr, may repeat again in 4 more hours as needed. Seek care if symptoms worsen. Consider daily Miralax and/or Dulcolax if symptoms persist.       Hyperglycemia    hgba1c acceptable, minimize simple carbs. Increase exercise as tolerated.       Anxiety state    Uses Lorazepam infrequently refill today       Other Visit Diagnoses    Encounter for immunization        Relevant Orders   Flu vaccine HIGH DOSE PF (Completed)      I have discontinued Mr. Rodkey atenolol. I am also having him start on atenolol. Additionally, I am having him maintain his amLODipine, atorvastatin, aspirin, Multiple Vitamins-Minerals (CENTRUM SILVER PO), LORazepam, latanoprost, and carbidopa-levodopa.  Meds ordered this encounter  Medications  . atenolol (TENORMIN) 50 MG tablet    Sig: Take 1 tablet (50 mg total) by mouth daily.    Dispense:  90 tablet    Refill:  1     [name], MD

## 2015-12-23 NOTE — Assessment & Plan Note (Signed)
Well controlled, no changes to meds. Encouraged heart healthy diet such as the DASH diet and exercise as tolerated.  °

## 2015-12-23 NOTE — Assessment & Plan Note (Addendum)
hgba1c acceptable, minimize simple carbs. Increase exercise as tolerated.  

## 2015-12-23 NOTE — Progress Notes (Signed)
Pre visit review using our clinic review tool, if applicable. No additional management support is needed unless otherwise documented below in the visit note. 

## 2015-12-23 NOTE — Assessment & Plan Note (Signed)
Encouraged increased hydration and fiber in diet. Daily probiotics. If bowels not moving can use MOM 2 tbls po in 4 oz of warm prune juice by mouth every 2-3 days. If no results then repeat in 4 hours with  Dulcolax suppository pr, may repeat again in 4 more hours as needed. Seek care if symptoms worsen. Consider daily Miralax and/or Dulcolax if symptoms persist.  

## 2015-12-23 NOTE — Patient Instructions (Signed)
Encouraged increased hydration and fiber in diet. Daily probiotics. If bowels not moving can use MOM 2 tbls po in 4 oz of warm prune juice by mouth every 2-3 days. If no results then repeat in 4 hours with  Dulcolax suppository pr, may repeat again in 4 more hours as needed. Seek care if symptoms worsen. Consider daily Miralax and/or Dulcolax if symptoms persist.   Metamucil and a probiotic, 10 strain cap, NOW company 1 cap daily  Constipation, Adult Constipation is when a person has fewer than three bowel movements a week, has difficulty having a bowel movement, or has stools that are dry, hard, or larger than normal. As people grow older, constipation is more common. A low-fiber diet, not taking in enough fluids, and taking certain medicines may make constipation worse.  CAUSES   Certain medicines, such as antidepressants, pain medicine, iron supplements, antacids, and water pills.   Certain diseases, such as diabetes, irritable bowel syndrome (IBS), thyroid disease, or depression.   Not drinking enough water.   Not eating enough fiber-rich foods.   Stress or travel.   Lack of physical activity or exercise.   Ignoring the urge to have a bowel movement.   Using laxatives too much.  SIGNS AND SYMPTOMS   Having fewer than three bowel movements a week.   Straining to have a bowel movement.   Having stools that are hard, dry, or larger than normal.   Feeling full or bloated.   Pain in the lower abdomen.   Not feeling relief after having a bowel movement.  DIAGNOSIS  Your health care provider will take a medical history and perform a physical exam. Further testing may be done for severe constipation. Some tests may include:  A barium enema X-ray to examine your rectum, colon, and, sometimes, your small intestine.   A sigmoidoscopy to examine your lower colon.   A colonoscopy to examine your entire colon. TREATMENT  Treatment will depend on the severity of your  constipation and what is causing it. Some dietary treatments include drinking more fluids and eating more fiber-rich foods. Lifestyle treatments may include regular exercise. If these diet and lifestyle recommendations do not help, your health care provider may recommend taking over-the-counter laxative medicines to help you have bowel movements. Prescription medicines may be prescribed if over-the-counter medicines do not work.  HOME CARE INSTRUCTIONS   Eat foods that have a lot of fiber, such as fruits, vegetables, whole grains, and beans.  Limit foods high in fat and processed sugars, such as french fries, hamburgers, cookies, candies, and soda.   A fiber supplement may be added to your diet if you cannot get enough fiber from foods.   Drink enough fluids to keep your urine clear or pale yellow.   Exercise regularly or as directed by your health care provider.   Go to the restroom when you have the urge to go. Do not hold it.   Only take over-the-counter or prescription medicines as directed by your health care provider. Do not take other medicines for constipation without talking to your health care provider first.  Why IF:   You have bright red blood in your stool.   Your constipation lasts for more than 4 days or gets worse.   You have abdominal or rectal pain.   You have thin, pencil-like stools.   You have unexplained weight loss. MAKE SURE YOU:   Understand these instructions.  Will watch your condition.  Will get help  right away if you are not doing well or get worse.   This information is not intended to replace advice given to you by your health care provider. Make sure you discuss any questions you have with your health care provider.   Document Released: 12/16/2003 Document Revised: 04/09/2014 Document Reviewed: 12/29/2012 Elsevier Interactive Patient Education [employer].

## 2015-12-23 NOTE — Assessment & Plan Note (Signed)
Uses Lorazepam infrequently refill today

## 2015-12-23 NOTE — Assessment & Plan Note (Signed)
Recently diagnosed and has increased exercise and is doing well, tremor is minor in right hand and arm and is not progressing obviously

## 2015-12-23 NOTE — Assessment & Plan Note (Signed)
Encouraged heart healthy diet, increase exercise, avoid trans fats, consider a krill oil cap daily 

## 2016-01-10 DIAGNOSIS — H524 Presbyopia: Secondary | ICD-10-CM | POA: Diagnosis not present

## 2016-01-10 DIAGNOSIS — H2513 Age-related nuclear cataract, bilateral: Secondary | ICD-10-CM | POA: Diagnosis not present

## 2016-01-10 DIAGNOSIS — H01005 Unspecified blepharitis left lower eyelid: Secondary | ICD-10-CM | POA: Diagnosis not present

## 2016-01-10 DIAGNOSIS — H01004 Unspecified blepharitis left upper eyelid: Secondary | ICD-10-CM | POA: Diagnosis not present

## 2016-01-10 DIAGNOSIS — H401132 Primary open-angle glaucoma, bilateral, moderate stage: Secondary | ICD-10-CM | POA: Diagnosis not present

## 2016-01-10 DIAGNOSIS — H01002 Unspecified blepharitis right lower eyelid: Secondary | ICD-10-CM | POA: Diagnosis not present

## 2016-01-10 DIAGNOSIS — H0100A Unspecified blepharitis right eye, upper and lower eyelids: Secondary | ICD-10-CM

## 2016-01-10 DIAGNOSIS — H01001 Unspecified blepharitis right upper eyelid: Secondary | ICD-10-CM | POA: Diagnosis not present

## 2016-01-10 DIAGNOSIS — H401112 Primary open-angle glaucoma, right eye, moderate stage: Secondary | ICD-10-CM | POA: Insufficient documentation

## 2016-01-10 DIAGNOSIS — Z83511 Family history of glaucoma: Secondary | ICD-10-CM | POA: Diagnosis not present

## 2016-01-10 HISTORY — DX: Unspecified blepharitis right eye, upper and lower eyelids: H01.00A

## 2016-01-10 HISTORY — DX: Primary open-angle glaucoma, right eye, moderate stage: H40.1112

## 2016-01-18 DIAGNOSIS — N189 Chronic kidney disease, unspecified: Secondary | ICD-10-CM | POA: Diagnosis not present

## 2016-01-18 DIAGNOSIS — I129 Hypertensive chronic kidney disease with stage 1 through stage 4 chronic kidney disease, or unspecified chronic kidney disease: Secondary | ICD-10-CM | POA: Diagnosis not present

## 2016-01-18 DIAGNOSIS — Z7982 Long term (current) use of aspirin: Secondary | ICD-10-CM | POA: Diagnosis not present

## 2016-01-18 DIAGNOSIS — Z85528 Personal history of other malignant neoplasm of kidney: Secondary | ICD-10-CM | POA: Diagnosis not present

## 2016-01-18 DIAGNOSIS — N183 Chronic kidney disease, stage 3 (moderate): Secondary | ICD-10-CM | POA: Diagnosis not present

## 2016-01-18 DIAGNOSIS — E785 Hyperlipidemia, unspecified: Secondary | ICD-10-CM | POA: Diagnosis not present

## 2016-01-18 DIAGNOSIS — Z79899 Other long term (current) drug therapy: Secondary | ICD-10-CM | POA: Diagnosis not present

## 2016-02-17 NOTE — Progress Notes (Signed)
Vincent Walker was seen today in the movement disorders clinic for neurologic consultation at the request of [name], MD.  The consultation is for the evaluation of R hand tremor and shuffling.  This patient is accompanied in the office by his spouse who supplements the history.  02/17/16 update:  The patient was diagnosed with Parkinson's disease last visit and started on carbidopa/levodopa 25/100, one tablet 3 times per day (7am/11:30am/bedtime).  States that he dosed it this way because he slept better with the bedtime dosing.  The patient had an MRI of the brain on 11/30/2015 with and without gadolinium.  I had the opportunity to review this.  It was normal.  In regards to his restless leg syndrome, the patient states that he is doing well.  In regards to mood, he states that it isn't good and there are mood swings.  They watch the grandkids and the noise and lack of peace will irritate him.  He doesn't have energy and he can't do the things that he used to be able to do.  Pt denies falls.  Pt denies lightheadedness, near syncope.  No hallucinations.  He is on stationary bike at home 3-5 times per week.     PREVIOUS MEDICATIONS: zoloft (felt more depressed when started taking that - only 25 mg and didn't take for long)  ALLERGIES:   Allergies  Allergen Reactions  . Sulfa Antibiotics Other (See Comments)    Had a reaction as a child    CURRENT MEDICATIONS:  Outpatient Encounter Prescriptions as of 02/20/2016  Medication Sig  . amLODipine (NORVASC) 10 MG tablet Take 10 mg by mouth daily.  Marland Kitchen aspirin 81 MG tablet Take 81 mg by mouth daily.  Marland Kitchen atenolol (TENORMIN) 50 MG tablet Take 1 tablet (50 mg total) by mouth daily.  Marland Kitchen atorvastatin (LIPITOR) 20 MG tablet Take 20 mg by mouth daily.  . carbidopa-levodopa (SINEMET IR) 25-100 MG tablet Take 1 tablet by mouth 3 (three) times daily.  Marland Kitchen latanoprost (XALATAN) 0.005 % ophthalmic solution PLACE 1 DROP INTO BOTH EYES NIGHTLY.  . LORazepam  (ATIVAN) 1 MG tablet Take 1 mg by mouth daily as needed for anxiety.  . Multiple Vitamins-Minerals (CENTRUM SILVER PO) Take by mouth daily.   No facility-administered encounter medications on file as of 02/20/2016.     PAST MEDICAL HISTORY:   Past Medical History:  Diagnosis Date  . Anxiety state 12/23/2015  . Benign essential HTN 02/08/2015  . Cancer [facility])    Kidney Cancer  . Chronic kidney disease   . Chronic renal insufficiency 02/14/2015  . Constipation 12/23/2015  . Dyslipidemia 02/08/2015  . H/O renal cell carcinoma 02/08/2015  . History of blood transfusion 2010   After Kidney surgery  . History of chicken pox   . Hyperglycemia 12/23/2015  . Hyperlipidemia   . Hypertension   . Ingrown left big toenail 02/14/2015  . Mitral valve prolapse   . Parkinson disease ([place]) 12/23/2015  . Thrombocytopenia ([place]) 02/08/2015  . Tremor of right hand 02/14/2015    PAST SURGICAL HISTORY:   Past Surgical History:  Procedure Laterality Date  . APPENDECTOMY  1995  . left knee scope  2003  . right knee scope  1999  . TONSILLECTOMY    . TOTAL KNEE ARTHROPLASTY Left 07/06/2014  . TOTAL NEPHRECTOMY Right     SOCIAL HISTORY:   Social History   Social History  . Marital status: Married    Spouse name: N/A  .  Number of children: N/A  . Years of education: 61   Occupational History  . retired Freight forwarder in [place]  . Smoking status: Former Smoker    Types: Pipe    Quit date: 11/18/1995  . Smokeless tobacco: Never Used     Comment: stopped 20 years ago.  1996  . Alcohol use 0.0 oz/week     Comment: once a week   . Drug use: No  . 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 Not on file   Social History Narrative  . No narrative on file    FAMILY HISTORY:   Family Status  Relation Status  . Father Deceased  . Maternal Aunt  Deceased  . Paternal Uncle Deceased  . Mother Deceased  . Daughter Alive  . Son Alive  . Maternal Grandmother Deceased at age 52  . Maternal Grandfather Deceased at age 75  . Paternal Grandmother Deceased at age 49  . Paternal Grandfather Deceased at age 81  . Daughter Alive    ROS:  A complete 10 system review of systems was obtained and was unremarkable apart from what is mentioned above.  PHYSICAL EXAMINATION:    VITALS:   Vitals:   02/20/16 0844  BP: 124/84  Pulse: 65  Weight: 242 lb (109.8 kg)  Height: 6\' 3"  (1.905 m)    GEN:  The patient appears stated age and is in NAD. HEENT:  Normocephalic, atraumatic.  The mucous membranes are moist. The superficial temporal arteries are without ropiness or tenderness. CV:  RRR Lungs:  CTAB Neck/HEME:  There are no carotid bruits bilaterally.  Neurological examination:  Orientation: The patient is alert and oriented x3. Fund of knowledge is appropriate.  Recent and remote memory are intact.  Attention and concentration are normal.    Able to name objects and repeat phrases. Cranial nerves: There is good facial symmetry. There is facial hypomimia.  Pupils are equal round and reactive to light bilaterally. Fundoscopic exam reveals clear margins bilaterally. Extraocular muscles are intact. The visual fields are full to confrontational testing. The speech is fluent and clear. Soft palate rises symmetrically and there is no tongue deviation. Hearing is slightly decreased to conversational tone. Sensation: Sensation is intact to light and pinprick throughout (facial, trunk, extremities). Vibration is intact at the bilateral big toe. There is no extinction with double simultaneous stimulation. There is no sensory dermatomal level identified. Motor: Strength is 5/5 in the bilateral upper and lower extremities.   Shoulder shrug is equal and symmetric.  There is no pronator drift.  Movement examination: Tone: There is normal tone in the UE/LE  today Abnormal movements: There is an intermittent RUE resting tremor Coordination:  There is improved with RAM's with only noted with alternation of supination/pronation of the forearm on the right   Gait and Station: The patient has no difficulty arising out of a deep-seated chair without the use of the hands. The patient's stride length is normal with decreased arm swing on the right.  The patient has a negative pull test.      LABS    Chemistry      Component Value Date/Time   NA 136 10/18/2015 1233   K 4.4 10/18/2015 1233   CL 102 10/18/2015 1233   CO2 26 10/18/2015 1233   BUN 19 10/18/2015 1233  CREATININE 1.49 10/18/2015 1233      Component Value Date/Time   CALCIUM 9.8 10/18/2015 1233   ALKPHOS 75 10/18/2015 1233   AST 23 10/18/2015 1233   ALT 26 10/18/2015 1233   BILITOT 0.7 10/18/2015 1233     Lab Results  Component Value Date   WBC 9.3 02/08/2015   HGB 15.6 02/08/2015   HCT 46.8 02/08/2015   MCV 90.6 02/08/2015   PLT 184.0 02/08/2015   Lab Results  Component Value Date   [id number] 11/18/2015     Lab Results  Component Value Date   IRON 71 11/18/2015    Lab Results  Component Value Date   TSH 1.29 11/18/2015     ASSESSMENT/PLAN:  1.  Idiopathic Parkinson's disease.  He is Hoehn and Yoehr stage 1.  This is a new dx on 11/18/15. The patient has tremor, bradykinesia, rigidity and mild postural instability.  -We discussed the diagnosis as well as pathophysiology of the disease.  We discussed treatment options as well as prognostic indicators.  Patient education was provided.  -He will continue his carbidopa/levodopa 25/100, one tablet 3 times per day but change the last one from qhs to dinner.  -add carbidopa/levodopa 50/200 q hs as waking with inner tremor  -encouraged him to look at [facility] boxing  2.  RLS  -The levodopa has helped but will change to the CR at night  3.  Depression and anxiety  -Can be associated with Parkinson's disease.     -add lexapro - 10 mg q day  4.  We will follow-up with him in the next few months, sooner should new neurologic issues arise.  Much greater than 50% of this visit was spent in counseling and coordinating care.  Total face to face time:  35 min

## 2016-02-20 ENCOUNTER — Ambulatory Visit (INDEPENDENT_AMBULATORY_CARE_PROVIDER_SITE_OTHER): Payer: Medicare Other | Admitting: Neurology

## 2016-02-20 ENCOUNTER — Encounter: Payer: Self-pay | Admitting: Neurology

## 2016-02-20 VITALS — BP 124/84 | HR 65 | Ht 75.0 in | Wt 242.0 lb

## 2016-02-20 DIAGNOSIS — G20 Parkinson's disease: Secondary | ICD-10-CM | POA: Diagnosis not present

## 2016-02-20 DIAGNOSIS — G2581 Restless legs syndrome: Secondary | ICD-10-CM | POA: Diagnosis not present

## 2016-02-20 DIAGNOSIS — F330 Major depressive disorder, recurrent, mild: Secondary | ICD-10-CM | POA: Diagnosis not present

## 2016-02-20 MED ORDER — CARBIDOPA-LEVODOPA ER 50-200 MG PO TBCR: 1.0000 | EXTENDED_RELEASE_TABLET | Freq: Every day | ORAL | 5 refills | Status: DC

## 2016-02-20 MED ORDER — ESCITALOPRAM OXALATE 10 MG PO TABS: 10.0000 mg | ORAL_TABLET | Freq: Every day | ORAL | 5 refills | Status: DC

## 2016-02-20 MED FILL — ESCITALOPRAM 10 MG TABLET: 10 | 30 days supply | Qty: 30 | Fill #0

## 2016-02-20 NOTE — Addendum Note (Signed)
Addended by: Annamaria Helling on: 02/20/2016 09:24 AM   Modules accepted: Orders

## 2016-02-20 NOTE — Patient Instructions (Signed)
1.  Start carbidopa/levodopa 50/200 at bedtime.  Let me know if you can't find this medication at the pharmacy 2.  Take your carbidopa/levodopa 25/100 30 minutes before each meal 3.  Start lexapro 10 mg daily 4.  Happy holidays!!

## 2016-03-19 MED FILL — ESCITALOPRAM 10 MG TABLET: 10 | 30 days supply | Qty: 30 | Fill #1

## 2016-03-19 MED FILL — ATENOLOL 100 MG TABLET: 100 | 90 days supply | Qty: 45 | Fill #0

## 2016-03-20 ENCOUNTER — Encounter: Payer: Self-pay | Admitting: Family Medicine

## 2016-03-20 ENCOUNTER — Other Ambulatory Visit: Payer: Self-pay | Admitting: Neurology

## 2016-03-20 ENCOUNTER — Other Ambulatory Visit: Payer: Self-pay | Admitting: Family Medicine

## 2016-03-20 DIAGNOSIS — G20 Parkinson's disease: Secondary | ICD-10-CM

## 2016-03-20 MED ORDER — CARBIDOPA-LEVODOPA 25-100 MG PO TABS: 1.0000 | ORAL_TABLET | Freq: Three times a day (TID) | ORAL | 1 refills | Status: DC

## 2016-03-20 MED ORDER — ATORVASTATIN CALCIUM 20 MG PO TABS: 20.0000 mg | ORAL_TABLET | Freq: Every day | ORAL | 0 refills | Status: DC

## 2016-03-20 MED ORDER — CARBIDOPA-LEVODOPA 25-100 MG PO TABS: 1.0000 | ORAL_TABLET | Freq: Three times a day (TID) | ORAL | 5 refills | Status: DC

## 2016-03-20 MED FILL — ATORVASTATIN 20 MG TABLET: 20 | 90 days supply | Qty: 90 | Fill #0

## 2016-03-20 NOTE — Addendum Note (Signed)
Addended byAnnamaria Helling on: 03/20/2016 01:19 PM   Modules accepted: Orders

## 2016-04-17 DIAGNOSIS — M79672 Pain in left foot: Secondary | ICD-10-CM | POA: Diagnosis not present

## 2016-04-18 MED FILL — ESCITALOPRAM 10 MG TABLET: 10 | 30 days supply | Qty: 30 | Fill #2

## 2016-04-26 ENCOUNTER — Other Ambulatory Visit: Payer: Self-pay

## 2016-04-26 ENCOUNTER — Telehealth: Payer: Self-pay

## 2016-04-26 DIAGNOSIS — I10 Essential (primary) hypertension: Secondary | ICD-10-CM

## 2016-04-26 DIAGNOSIS — R739 Hyperglycemia, unspecified: Secondary | ICD-10-CM

## 2016-04-26 DIAGNOSIS — E785 Hyperlipidemia, unspecified: Secondary | ICD-10-CM

## 2016-04-27 NOTE — Telephone Encounter (Signed)
Error

## 2016-05-01 ENCOUNTER — Other Ambulatory Visit (INDEPENDENT_AMBULATORY_CARE_PROVIDER_SITE_OTHER): Payer: Medicare Other

## 2016-05-01 DIAGNOSIS — E785 Hyperlipidemia, unspecified: Secondary | ICD-10-CM | POA: Diagnosis not present

## 2016-05-01 DIAGNOSIS — I10 Essential (primary) hypertension: Secondary | ICD-10-CM | POA: Diagnosis not present

## 2016-05-01 DIAGNOSIS — R739 Hyperglycemia, unspecified: Secondary | ICD-10-CM | POA: Diagnosis not present

## 2016-05-01 LAB — COMPREHENSIVE METABOLIC PANEL
ALBUMIN: 4.5 g/dL (ref 3.5–5.2)
ALT: 6 U/L (ref 0–53)
AST: 19 U/L (ref 0–37)
Alkaline Phosphatase: 74 U/L (ref 39–117)
BILIRUBIN TOTAL: 0.7 mg/dL (ref 0.2–1.2)
BUN: 19 mg/dL (ref 6–23)
CHLORIDE: 107 meq/L (ref 96–112)
CO2: 26 mEq/L (ref 19–32)
CREATININE: 1.58 mg/dL — AB (ref 0.40–1.50)
Calcium: 9.6 mg/dL (ref 8.4–10.5)
GFR: 45.92 mL/min — ABNORMAL LOW (ref >60.00)
Glucose, Bld: 110 mg/dL — ABNORMAL HIGH (ref 70–99)
Potassium: 4.4 mEq/L (ref 3.5–5.1)
SODIUM: 138 meq/L (ref 135–145)
TOTAL PROTEIN: 7.2 g/dL (ref 6.0–8.3)

## 2016-05-01 LAB — CBC
HCT: 45.2 % (ref 39.0–52.0)
Hemoglobin: 15.5 g/dL (ref 13.0–17.0)
MCHC: 34.3 g/dL (ref 30.0–36.0)
MCV: 91.5 fl (ref 78.0–100.0)
Platelets: 174 10*3/uL (ref 150.0–400.0)
RBC: 4.94 Mil/uL (ref 4.22–5.81)
RDW: 13.7 % (ref 11.5–15.5)
WBC: 7.9 10*3/uL (ref 4.0–10.5)

## 2016-05-01 LAB — LIPID PANEL
Cholesterol: 117 mg/dL (ref 0–200)
HDL: 42.2 mg/dL (ref >39.00)
LDL CALC: 38 mg/dL (ref 0–99)
NONHDL: 74.45
Total CHOL/HDL Ratio: 3
Triglycerides: 181 mg/dL — ABNORMAL HIGH (ref 0.0–149.0)
VLDL: 36.2 mg/dL (ref 0.0–40.0)

## 2016-05-01 LAB — HEMOGLOBIN A1C: HEMOGLOBIN A1C: 6 % (ref 4.6–6.5)

## 2016-05-01 LAB — TSH: TSH: 3.18 u[IU]/mL (ref 0.35–4.50)

## 2016-05-08 DIAGNOSIS — M79672 Pain in left foot: Secondary | ICD-10-CM | POA: Diagnosis not present

## 2016-05-08 DIAGNOSIS — M2042 Other hammer toe(s) (acquired), left foot: Secondary | ICD-10-CM | POA: Diagnosis not present

## 2016-05-14 ENCOUNTER — Encounter: Payer: Self-pay | Admitting: Family Medicine

## 2016-05-14 ENCOUNTER — Ambulatory Visit (INDEPENDENT_AMBULATORY_CARE_PROVIDER_SITE_OTHER): Payer: Medicare Other | Admitting: Family Medicine

## 2016-05-14 DIAGNOSIS — N189 Chronic kidney disease, unspecified: Secondary | ICD-10-CM

## 2016-05-14 DIAGNOSIS — Z01818 Encounter for other preprocedural examination: Secondary | ICD-10-CM

## 2016-05-14 DIAGNOSIS — I341 Nonrheumatic mitral (valve) prolapse: Secondary | ICD-10-CM | POA: Insufficient documentation

## 2016-05-14 DIAGNOSIS — R739 Hyperglycemia, unspecified: Secondary | ICD-10-CM

## 2016-05-14 DIAGNOSIS — I10 Essential (primary) hypertension: Secondary | ICD-10-CM

## 2016-05-14 DIAGNOSIS — E785 Hyperlipidemia, unspecified: Secondary | ICD-10-CM | POA: Diagnosis not present

## 2016-05-14 DIAGNOSIS — M79672 Pain in left foot: Secondary | ICD-10-CM

## 2016-05-14 DIAGNOSIS — Z83511 Family history of glaucoma: Secondary | ICD-10-CM | POA: Diagnosis not present

## 2016-05-14 DIAGNOSIS — H401132 Primary open-angle glaucoma, bilateral, moderate stage: Secondary | ICD-10-CM | POA: Diagnosis not present

## 2016-05-14 HISTORY — DX: Nonrheumatic mitral (valve) prolapse: I34.1

## 2016-05-14 NOTE — Assessment & Plan Note (Signed)
Has surgery scheduled for 2/22 at [facility] in [place] to have a left 2nd hammer toe repair and 3rd and 4th weil osteotomy. He is surgically cleared today pending any change in his medical status. He will discuss any further concerns regarding risk or benefit with his surgeon. No OTC or aspirin 1 week prior to surgery

## 2016-05-14 NOTE — Assessment & Plan Note (Signed)
Stable encouraged increased hydration leading up to surgery.

## 2016-05-14 NOTE — Progress Notes (Signed)
Patient ID: Vincent Walker, male   DOB: 03-07-1946, 71 y.o.   MRN: [id number]   Subjective:    Patient ID: Vincent Walker, male    DOB: 08-04-1945, 71 y.o.   MRN: [id number]  Chief Complaint  Patient presents with  . Pre-op Exam  I acted as a Education administrator for Dr. Charlett Blake. [name], RMA   HPI Patient is in today for pre operative evaluation. He is anxious to proceed with surgery with podiatry for correction of left foot pain. With his gait trouble secondary to his Parkinson's Disease he is hopeful to correct one of the causes of his worsening gait. He denies any other concerns and reports he feels well. No recent febrile illness or hospitalizations. Denies CP/palp/SOB/HA/congestion/fevers/GI or GU c/o. Taking meds as prescribed  Past Medical History:  Diagnosis Date  . Anxiety state 12/23/2015  . Benign essential HTN 02/08/2015  . Cancer [facility])    Kidney Cancer  . Chronic kidney disease   . Chronic renal insufficiency 02/14/2015  . Constipation 12/23/2015  . Dyslipidemia 02/08/2015  . H/O renal cell carcinoma 02/08/2015  . History of blood transfusion 2010   After Kidney surgery  . History of chicken pox   . Hyperglycemia 12/23/2015  . Hyperlipidemia   . Hypertension   . Ingrown left big toenail 02/14/2015  . Left foot pain 02/14/2015  . Mitral valve prolapse   . MVP (mitral valve prolapse) 05/14/2016  . Parkinson disease ([place]) 12/23/2015  . Thrombocytopenia ([place]) 02/08/2015  . Tremor of right hand 02/14/2015    Past Surgical History:  Procedure Laterality Date  . APPENDECTOMY  1995  . left knee scope  2003  . right knee scope  1999  . TONSILLECTOMY    . TOTAL KNEE ARTHROPLASTY Left 07/06/2014  . TOTAL NEPHRECTOMY Right     Family History  Problem Relation Age of Onset  . Alcohol abuse Father   . Hyperlipidemia Father   . Hypertension Father   . Diabetes Father   . Heart disease Father   . Cancer Maternal Aunt     lung cancer  . Cancer Paternal Uncle     bone cancer  . Heart attack  Mother   . Stroke Mother     swelling in brain stem    Social History   Social History  . Marital status: Married    Spouse name: N/A  . Number of children: N/A  . Years of education: 38   Occupational History  . 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 Smoking status: Former Smoker    Types: Pipe    Quit date: 11/18/1995  . Smokeless tobacco: Never Used     Comment: stopped 20 years ago.  1996  . Alcohol use 0.0 oz/week     Comment: once a week   . Drug use: No  . 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 Not on file   Social History Narrative  . No narrative on file    Outpatient Medications Prior to Visit  Medication Sig Dispense Refill  . aspirin 81 MG tablet Take 81 mg by mouth daily.    Marland Kitchen atenolol (TENORMIN) 50 MG tablet Take 1 tablet (50 mg total) by mouth daily. 90 tablet 1  . atorvastatin (LIPITOR) 20 MG tablet Take 1 tablet (20 mg total) by mouth  daily. 90 tablet 0  . carbidopa-levodopa (SINEMET CR) 50-200 MG tablet Take 1 tablet by mouth at bedtime. 30 tablet 5  . carbidopa-levodopa (SINEMET IR) 25-100 MG tablet Take 1 tablet by mouth 3 (three) times daily. 270 tablet 1  . escitalopram (LEXAPRO) 10 MG tablet Take 1 tablet (10 mg total) by mouth daily. 30 tablet 5  . latanoprost (XALATAN) 0.005 % ophthalmic solution PLACE 1 DROP INTO BOTH EYES NIGHTLY.    . LORazepam (ATIVAN) 1 MG tablet Take 1 mg by mouth daily as needed for anxiety.    . Multiple Vitamins-Minerals (CENTRUM SILVER PO) Take by mouth daily.    Marland Kitchen amLODipine (NORVASC) 10 MG tablet Take 10 mg by mouth daily.     No facility-administered medications prior to visit.     Allergies  Allergen Reactions  . Sulfa Antibiotics Other (See Comments)    Had a reaction as a child    Review of Systems  Constitutional: Negative for fever and malaise/fatigue.    HENT: Negative for congestion.   Eyes: Negative for blurred vision.  Respiratory: Negative for cough and shortness of breath.   Cardiovascular: Negative for chest pain, palpitations and leg swelling.  Gastrointestinal: Negative for vomiting.  Musculoskeletal: Positive for joint pain. Negative for back pain.  Skin: Negative for rash.  Neurological: Positive for tremors. Negative for loss of consciousness and headaches.       Objective:    Physical Exam  Constitutional: He is oriented to person, place, and time. He appears well-developed and well-nourished. No distress.  HENT:  Head: Normocephalic and atraumatic.  Eyes: Conjunctivae are normal.  Neck: Normal range of motion. No thyromegaly present.  Cardiovascular: Normal rate and regular rhythm.   Pulmonary/Chest: Effort normal and breath sounds normal. He has no wheezes.  Abdominal: Soft. Bowel sounds are normal. There is no tenderness.  Musculoskeletal: Normal range of motion. He exhibits no edema or deformity.  2nd, 3rd and 4th toes have some deformity  Neurological: He is alert and oriented to person, place, and time. He exhibits abnormal muscle tone.  Tremor noted  Skin: Skin is warm and dry. He is not diaphoretic.  Psychiatric: He has a normal mood and affect.    There were no vitals taken for this visit. Wt Readings from Last 3 Encounters:  02/20/16 242 lb (109.8 kg)  12/23/15 238 lb 8 oz (108.2 kg)  11/18/15 238 lb (108 kg)     Lab Results  Component Value Date   WBC 7.9 05/01/2016   HGB 15.5 05/01/2016   HCT 45.2 05/01/2016   PLT 174.0 05/01/2016   GLUCOSE 110 (H) 05/01/2016   CHOL 117 05/01/2016   TRIG 181.0 (H) 05/01/2016   HDL 42.20 05/01/2016   LDLCALC 38 05/01/2016   ALT 6 05/01/2016   AST 19 05/01/2016   NA 138 05/01/2016   K 4.4 05/01/2016   CL 107 05/01/2016   CREATININE 1.58 (H) 05/01/2016   BUN 19 05/01/2016   CO2 26 05/01/2016   TSH 3.18 05/01/2016   HGBA1C 6.0 05/01/2016    Lab  Results  Component Value Date   TSH 3.18 05/01/2016   Lab Results  Component Value Date   WBC 7.9 05/01/2016   HGB 15.5 05/01/2016   HCT 45.2 05/01/2016   MCV 91.5 05/01/2016   PLT 174.0 05/01/2016   Lab Results  Component Value Date   NA 138 05/01/2016   K 4.4 05/01/2016   CO2 26 05/01/2016   GLUCOSE 110 (H) 05/01/2016  BUN 19 05/01/2016   CREATININE 1.58 (H) 05/01/2016   BILITOT 0.7 05/01/2016   ALKPHOS 74 05/01/2016   AST 19 05/01/2016   ALT 6 05/01/2016   PROT 7.2 05/01/2016   ALBUMIN 4.5 05/01/2016   CALCIUM 9.6 05/01/2016   GFR 45.92 (L) 05/01/2016   Lab Results  Component Value Date   CHOL 117 05/01/2016   Lab Results  Component Value Date   HDL 42.20 05/01/2016   Lab Results  Component Value Date   LDLCALC 38 05/01/2016   Lab Results  Component Value Date   TRIG 181.0 (H) 05/01/2016   Lab Results  Component Value Date   CHOLHDL 3 05/01/2016   Lab Results  Component Value Date   HGBA1C 6.0 05/01/2016       Assessment & Plan:   Problem List Items Addressed This Visit    Benign essential HTN    Well controlled, no changes to meds. Encouraged heart healthy diet such as the DASH diet and exercise as tolerated.       Dyslipidemia    Tolerating statin, encouraged heart healthy diet, avoid trans fats, minimize simple carbs and saturated fats. Increase exercise as tolerated      Left foot pain    Has surgery scheduled for 2/22 at [facility] in [place] to have a left 2nd hammer toe repair and 3rd and 4th weil osteotomy. He is surgically cleared today pending any change in his medical status. He will discuss any further concerns regarding risk or benefit with his surgeon. No OTC or aspirin 1 week prior to surgery       Chronic renal insufficiency    Stable encouraged increased hydration leading up to surgery.      Hyperglycemia    hgba1c acceptable, minimize simple carbs. Increase exercise as tolerated.       MVP (mitral valve  prolapse)    Mild, present 40 years. Patient reports last echo roughly 6 years ago before he moved here. Encouraged to consider repeat declines for now, will consider at next visit. asympotmatic       Other Visit Diagnoses    Preoperative clearance    -  Primary   Relevant Orders   EKG 12-Lead (Completed)      I am having Mr. Venables maintain his aspirin, Multiple Vitamins-Minerals (CENTRUM SILVER PO), LORazepam, latanoprost, atenolol, carbidopa-levodopa, escitalopram, carbidopa-levodopa, and atorvastatin.  No orders of the defined types were placed in this encounter.   CMA served as Education administrator during this visit. History, Physical and Plan performed by medical provider. Documentation and orders reviewed and attested to.  [name], MD

## 2016-05-14 NOTE — Assessment & Plan Note (Signed)
Well controlled, no changes to meds. Encouraged heart healthy diet such as the DASH diet and exercise as tolerated.  °

## 2016-05-14 NOTE — Assessment & Plan Note (Signed)
Mild, present 40 years. Patient reports last echo roughly 6 years ago before he moved here. Encouraged to consider repeat declines for now, will consider at next visit. asympotmatic

## 2016-05-14 NOTE — Progress Notes (Signed)
Pre visit review using our clinic review tool, if applicable. No additional management support is needed unless otherwise documented below in the visit note. 

## 2016-05-14 NOTE — Assessment & Plan Note (Signed)
hgba1c acceptable, minimize simple carbs. Increase exercise as tolerated.  

## 2016-05-14 NOTE — Patient Instructions (Signed)
Carbohydrate Counting for Diabetes Mellitus, Adult Carbohydrate counting is a method for keeping track of how many carbohydrates you eat. Eating carbohydrates naturally increases the amount of sugar (glucose) in the blood. Counting how many carbohydrates you eat helps keep your blood glucose within normal limits, which helps you manage your diabetes (diabetes mellitus). It is important to know how many carbohydrates you can safely have in each meal. This is different for every person. A diet and nutrition specialist (registered dietitian) can help you make a meal plan and calculate how many carbohydrates you should have at each meal and snack. Carbohydrates are found in the following foods:  Grains, such as breads and cereals.  Dried beans and soy products.  Starchy vegetables, such as potatoes, peas, and corn.  Fruit and fruit juices.  Milk and yogurt.  Sweets and snack foods, such as cake, cookies, candy, chips, and soft drinks. How do I count carbohydrates? There are two ways to count carbohydrates in food. You can use either of the methods or a combination of both. Reading "Nutrition Facts" on packaged food  The "Nutrition Facts" list is included on the labels of almost all packaged foods and beverages in the [place] It includes:  The serving size.  Information about nutrients in each serving, including the grams (g) of carbohydrate per serving. To use the "Nutrition Facts":  Decide how many servings you will have.  Multiply the number of servings by the number of carbohydrates per serving.  The resulting number is the total amount of carbohydrates that you will be having. Learning standard serving sizes of other foods  When you eat foods containing carbohydrates that are not packaged or do not include "Nutrition Facts" on the label, you need to measure the servings in order to count the amount of carbohydrates:  Measure the foods that you will eat with a food scale or measuring  cup, if needed.  Decide how many standard-size servings you will eat.  Multiply the number of servings by 15. Most carbohydrate-rich foods have about 15 g of carbohydrates per serving.  For example, if you eat 8 oz (170 g) of strawberries, you will have eaten 2 servings and 30 g of carbohydrates (2 servings x 15 g = 30 g).  For foods that have more than one food mixed, such as soups and casseroles, you must count the carbohydrates in each food that is included. The following list contains standard serving sizes of common carbohydrate-rich foods. Each of these servings has about 15 g of carbohydrates:   hamburger bun or  English muffin.   oz (15 mL) syrup.   oz (14 g) jelly.  1 slice of bread.  1 six-inch tortilla.  3 oz (85 g) cooked rice or pasta.  4 oz (113 g) cooked dried beans.  4 oz (113 g) starchy vegetable, such as peas, corn, or potatoes.  4 oz (113 g) hot cereal.  4 oz (113 g) mashed potatoes or  of a large baked potato.  4 oz (113 g) canned or frozen fruit.  4 oz (120 mL) fruit juice.  4-6 crackers.  6 chicken nuggets.  6 oz (170 g) unsweetened dry cereal.  6 oz (170 g) plain fat-free yogurt or yogurt sweetened with artificial sweeteners.  8 oz (240 mL) milk.  8 oz (170 g) fresh fruit or one small piece of fruit.  24 oz (680 g) popped popcorn. Example of carbohydrate counting Sample meal  3 oz (85 g) chicken breast.  6 oz (  170 g) brown rice.  4 oz (113 g) corn.  8 oz (240 mL) milk.  8 oz (170 g) strawberries with sugar-free whipped topping. Carbohydrate calculation 1. Identify the foods that contain carbohydrates:  Rice.  Corn.  Milk.  Strawberries. 2. Calculate how many servings you have of each food:  2 servings rice.  1 serving corn.  1 serving milk.  1 serving strawberries. 3. Multiply each number of servings by 15 g:  2 servings rice x 15 g = 30 g.  1 serving corn x 15 g = 15 g.  1 serving milk x 15 g = 15  g.  1 serving strawberries x 15 g = 15 g. 4. Add together all of the amounts to find the total grams of carbohydrates eaten:  30 g + 15 g + 15 g + 15 g = 75 g of carbohydrates total. This information is not intended to replace advice given to you by your health care provider. Make sure you discuss any questions you have with your health care provider. Document Released: 03/19/2005 Document Revised: 10/07/2015 Document Reviewed: 08/31/2015 Elsevier Interactive Patient Education  2017 Elsevier Inc.  

## 2016-05-14 NOTE — Assessment & Plan Note (Signed)
Tolerating statin, encouraged heart healthy diet, avoid trans fats, minimize simple carbs and saturated fats. Increase exercise as tolerated 

## 2016-05-16 ENCOUNTER — Encounter: Payer: Self-pay | Admitting: Family Medicine

## 2016-05-16 MED ORDER — AMLODIPINE BESYLATE 10 MG PO TABS: 10.0000 mg | ORAL_TABLET | Freq: Every day | ORAL | 5 refills | Status: DC

## 2016-05-16 MED FILL — ESCITALOPRAM 10 MG TABLET: 10 | 30 days supply | Qty: 30 | Fill #3

## 2016-05-18 NOTE — Progress Notes (Signed)
Vincent Walker was seen today in the movement disorders clinic for neurologic consultation at the request of [name], MD.  The consultation is for the evaluation of R hand tremor and shuffling.  This patient is accompanied in the office by his spouse who supplements the history.  02/17/16 update:  The patient was diagnosed with Parkinson's disease last visit and started on carbidopa/levodopa 25/100, one tablet 3 times per day (7am/11:30am/bedtime).  States that he dosed it this way because he slept better with the bedtime dosing.  The patient had an MRI of the brain on 11/30/2015 with and without gadolinium.  I had the opportunity to review this.  It was normal.  In regards to his restless leg syndrome, the patient states that he is doing well.  In regards to mood, he states that it isn't good and there are mood swings.  They watch the grandkids and the noise and lack of peace will irritate him.  He doesn't have energy and he can't do the things that he used to be able to do.  Pt denies falls.  Pt denies lightheadedness, near syncope.  No hallucinations.  He is on stationary bike at home 3-5 times per week.    05/22/16 update:  Patient follows up today.  He is on carbidopa/levodopa 25/100, one tablet 3 times per day.  Overall, the patient feels he has been stable. Had 4-5 episodes since our last visit of RLS.  Is on CR q hs.   Denies falls.  Denies lightheadedness or near syncope.  No hallucinations.  Mood has been good.  Continues to ride his exercise bike but not as much because of foot pain.  Having hammertoe surgery on 05/24/2016.  Was already given RX for zofran.      PREVIOUS MEDICATIONS: zoloft (felt more depressed when started taking that - only 25 mg and didn't take for long)  ALLERGIES:   Allergies  Allergen Reactions  . Sulfa Antibiotics Other (See Comments)    Had a reaction as a child    CURRENT MEDICATIONS:  Outpatient Encounter Prescriptions as of 05/22/2016  Medication Sig    . amLODipine (NORVASC) 10 MG tablet Take 1 tablet (10 mg total) by mouth daily.  Marland Kitchen atenolol (TENORMIN) 50 MG tablet Take 1 tablet (50 mg total) by mouth daily.  Marland Kitchen atorvastatin (LIPITOR) 20 MG tablet Take 1 tablet (20 mg total) by mouth daily.  . carbidopa-levodopa (SINEMET CR) 50-200 MG tablet Take 1 tablet by mouth at bedtime.  . carbidopa-levodopa (SINEMET IR) 25-100 MG tablet Take 1 tablet by mouth 3 (three) times daily.  Marland Kitchen escitalopram (LEXAPRO) 10 MG tablet Take 1 tablet (10 mg total) by mouth daily.  Marland Kitchen latanoprost (XALATAN) 0.005 % ophthalmic solution PLACE 1 DROP INTO BOTH EYES NIGHTLY.  . LORazepam (ATIVAN) 1 MG tablet Take 1 mg by mouth daily as needed for anxiety.  . Multiple Vitamins-Minerals (CENTRUM SILVER PO) Take by mouth daily.  Marland Kitchen aspirin 81 MG tablet Take 81 mg by mouth daily.   No facility-administered encounter medications on file as of 05/22/2016.     PAST MEDICAL HISTORY:   Past Medical History:  Diagnosis Date  . Anxiety state 12/23/2015  . Benign essential HTN 02/08/2015  . Cancer [facility])    Kidney Cancer  . Chronic kidney disease   . Chronic renal insufficiency 02/14/2015  . Constipation 12/23/2015  . Dyslipidemia 02/08/2015  . H/O renal cell carcinoma 02/08/2015  . History of blood transfusion 2010  After Kidney surgery  . History of chicken pox   . Hyperglycemia 12/23/2015  . Hyperlipidemia   . Hypertension   . Ingrown left big toenail 02/14/2015  . Left foot pain 02/14/2015  . Mitral valve prolapse   . MVP (mitral valve prolapse) 05/14/2016  . Parkinson disease ([place]) 12/23/2015  . Thrombocytopenia ([place]) 02/08/2015  . Tremor of right hand 02/14/2015    PAST SURGICAL HISTORY:   Past Surgical History:  Procedure Laterality Date  . APPENDECTOMY  1995  . left knee scope  2003  . right knee scope  1999  . TONSILLECTOMY    . TOTAL KNEE ARTHROPLASTY Left 07/06/2014  . TOTAL NEPHRECTOMY Right     SOCIAL HISTORY:   Social History   Social History  .  Marital status: Married    Spouse name: N/A  . Number of children: N/A  . Years of education: 66   Occupational History  . retired Freight forwarder in [place]  . Smoking status: Former Smoker    Types: Pipe    Quit date: 11/18/1995  . Smokeless tobacco: Never Used     Comment: stopped 20 years ago.  1996  . Alcohol use 0.0 oz/week     Comment: once a week   . Drug use: No  . 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 Not on file   Social History Narrative  . No narrative on file    FAMILY HISTORY:   Family Status  Relation Status  . Father Deceased  . Maternal Aunt Deceased  . Paternal Uncle Deceased  . Mother Deceased  . Daughter Alive  . Son Alive  . Maternal Grandmother Deceased at age 20  . Maternal Grandfather Deceased at age 13  . Paternal Grandmother Deceased at age 32  . Paternal Grandfather Deceased at age 70  . Daughter Alive    ROS:  A complete 10 system review of systems was obtained and was unremarkable apart from what is mentioned above.  PHYSICAL EXAMINATION:    VITALS:   Vitals:   05/22/16 1015  BP: 110/70  Pulse: 64  Weight: 242 lb (109.8 kg)  Height: 6\' 3"  (1.905 m)    GEN:  The patient appears stated age and is in NAD. HEENT:  Normocephalic, atraumatic.  The mucous membranes are moist. The superficial temporal arteries are without ropiness or tenderness. CV:  RRR Lungs:  CTAB Neck/HEME:  There are no carotid bruits bilaterally.  Neurological examination:  Orientation: The patient is alert and oriented x3.  Cranial nerves: There is good facial symmetry. There is facial hypomimia.  Pupils are equal round and reactive to light bilaterally. Fundoscopic exam reveals clear margins bilaterally. Extraocular muscles are intact. The visual fields are full to confrontational testing. The speech is  fluent and clear. Soft palate rises symmetrically and there is no tongue deviation. Hearing is slightly decreased to conversational tone. Sensation: Sensation is intact to light and pinprick throughout (facial, trunk, extremities). Vibration is intact at the bilateral big toe. There is no extinction with double simultaneous stimulation. There is no sensory dermatomal level identified. Motor: Strength is 5/5 in the bilateral upper and lower extremities.   Shoulder shrug is equal and symmetric.  There is no pronator drift.  Movement examination: Tone: There is normal tone in the UE/LE today Abnormal movements: There  is an intermittent RUE resting tremor Coordination:  There is improved with RAM's with only noted with alternation of supination/pronation of the forearm on the right   Gait and Station: The patient has no difficulty arising out of a deep-seated chair without the use of the hands. The patient's stride length is normal with decreased arm swing on the right.  The patient has a negative pull test.      LABS    Chemistry      Component Value Date/Time   NA 138 05/01/2016 0925   K 4.4 05/01/2016 0925   CL 107 05/01/2016 0925   CO2 26 05/01/2016 0925   BUN 19 05/01/2016 0925   CREATININE 1.58 (H) 05/01/2016 0925      Component Value Date/Time   CALCIUM 9.6 05/01/2016 0925   ALKPHOS 74 05/01/2016 0925   AST 19 05/01/2016 0925   ALT 6 05/01/2016 0925   BILITOT 0.7 05/01/2016 0925     Lab Results  Component Value Date   WBC 7.9 05/01/2016   HGB 15.5 05/01/2016   HCT 45.2 05/01/2016   MCV 91.5 05/01/2016   PLT 174.0 05/01/2016   Lab Results  Component Value Date   VITAMINB12 886 11/18/2015     Lab Results  Component Value Date   IRON 71 11/18/2015    Lab Results  Component Value Date   TSH 3.18 05/01/2016     ASSESSMENT/PLAN:  1.  Idiopathic Parkinson's disease.  He is Hoehn and Yoehr stage 1.  This is a new dx on 11/18/15. The patient has tremor, bradykinesia,  rigidity and mild postural instability.  -We discussed the diagnosis as well as pathophysiology of the disease.  We discussed treatment options as well as prognostic indicators.  Patient education was provided.  -He will continue his carbidopa/levodopa 25/100, one tablet 3 times per day but change the last one from qhs to dinner.  -move carbidopa/levodopa 50/200 to 1 hour prior to bedtime due to RLS at bedtime  2.  RLS  -The levodopa has helped but will change to the CR one hour before bed  3.  Depression and anxiety  -Can be associated with Parkinson's disease.    -continue lexapro - 10 mg q day  4.  We will follow-up with him in the next few months, sooner should new neurologic issues arise.  Much greater than 50% of this visit was spent in counseling and coordinating care.  Total face to face time:  25 min

## 2016-05-22 ENCOUNTER — Ambulatory Visit (INDEPENDENT_AMBULATORY_CARE_PROVIDER_SITE_OTHER): Payer: Medicare Other | Admitting: Neurology

## 2016-05-22 ENCOUNTER — Encounter: Payer: Self-pay | Admitting: Neurology

## 2016-05-22 VITALS — BP 110/70 | HR 64 | Ht 75.0 in | Wt 242.0 lb

## 2016-05-22 DIAGNOSIS — G2581 Restless legs syndrome: Secondary | ICD-10-CM | POA: Diagnosis not present

## 2016-05-22 DIAGNOSIS — G20 Parkinson's disease: Secondary | ICD-10-CM | POA: Diagnosis not present

## 2016-05-24 DIAGNOSIS — I129 Hypertensive chronic kidney disease with stage 1 through stage 4 chronic kidney disease, or unspecified chronic kidney disease: Secondary | ICD-10-CM | POA: Diagnosis not present

## 2016-05-24 DIAGNOSIS — M1990 Unspecified osteoarthritis, unspecified site: Secondary | ICD-10-CM | POA: Diagnosis not present

## 2016-05-24 DIAGNOSIS — E7800 Pure hypercholesterolemia, unspecified: Secondary | ICD-10-CM | POA: Diagnosis not present

## 2016-05-24 DIAGNOSIS — I519 Heart disease, unspecified: Secondary | ICD-10-CM | POA: Diagnosis not present

## 2016-05-24 DIAGNOSIS — Z6829 Body mass index (BMI) 29.0-29.9, adult: Secondary | ICD-10-CM | POA: Diagnosis not present

## 2016-05-24 DIAGNOSIS — Z79899 Other long term (current) drug therapy: Secondary | ICD-10-CM | POA: Diagnosis not present

## 2016-05-24 DIAGNOSIS — E663 Overweight: Secondary | ICD-10-CM | POA: Diagnosis not present

## 2016-05-24 DIAGNOSIS — M722 Plantar fascial fibromatosis: Secondary | ICD-10-CM | POA: Diagnosis not present

## 2016-05-24 DIAGNOSIS — Z888 Allergy status to other drugs, medicaments and biological substances status: Secondary | ICD-10-CM | POA: Diagnosis not present

## 2016-05-24 DIAGNOSIS — Z882 Allergy status to sulfonamides status: Secondary | ICD-10-CM | POA: Diagnosis not present

## 2016-05-24 DIAGNOSIS — Z87891 Personal history of nicotine dependence: Secondary | ICD-10-CM | POA: Diagnosis not present

## 2016-05-24 DIAGNOSIS — M7742 Metatarsalgia, left foot: Secondary | ICD-10-CM | POA: Diagnosis not present

## 2016-05-24 DIAGNOSIS — M2042 Other hammer toe(s) (acquired), left foot: Secondary | ICD-10-CM | POA: Diagnosis not present

## 2016-05-24 DIAGNOSIS — Z8249 Family history of ischemic heart disease and other diseases of the circulatory system: Secondary | ICD-10-CM | POA: Diagnosis not present

## 2016-05-24 DIAGNOSIS — Z833 Family history of diabetes mellitus: Secondary | ICD-10-CM | POA: Diagnosis not present

## 2016-05-24 DIAGNOSIS — Z7982 Long term (current) use of aspirin: Secondary | ICD-10-CM | POA: Diagnosis not present

## 2016-05-24 DIAGNOSIS — N189 Chronic kidney disease, unspecified: Secondary | ICD-10-CM | POA: Diagnosis not present

## 2016-06-01 DIAGNOSIS — Z4789 Encounter for other orthopedic aftercare: Secondary | ICD-10-CM | POA: Diagnosis not present

## 2016-06-01 DIAGNOSIS — M79672 Pain in left foot: Secondary | ICD-10-CM | POA: Diagnosis not present

## 2016-06-08 DIAGNOSIS — M2042 Other hammer toe(s) (acquired), left foot: Secondary | ICD-10-CM | POA: Diagnosis not present

## 2016-06-08 DIAGNOSIS — Z4789 Encounter for other orthopedic aftercare: Secondary | ICD-10-CM | POA: Diagnosis not present

## 2016-06-15 DIAGNOSIS — M79672 Pain in left foot: Secondary | ICD-10-CM | POA: Diagnosis not present

## 2016-06-18 ENCOUNTER — Other Ambulatory Visit: Payer: Self-pay | Admitting: Family Medicine

## 2016-06-18 MED FILL — ESCITALOPRAM 10 MG TABLET: 10 | 30 days supply | Qty: 30 | Fill #4

## 2016-06-18 MED FILL — ATORVASTATIN 20 MG TABLET: 20 | 90 days supply | Qty: 90 | Fill #0

## 2016-06-21 ENCOUNTER — Ambulatory Visit (INDEPENDENT_AMBULATORY_CARE_PROVIDER_SITE_OTHER): Payer: Medicare Other | Admitting: Family Medicine

## 2016-06-21 ENCOUNTER — Encounter: Payer: Self-pay | Admitting: Family Medicine

## 2016-06-21 VITALS — BP 115/61 | HR 65 | Temp 97.8°F | Ht 75.0 in | Wt 241.0 lb

## 2016-06-21 DIAGNOSIS — E785 Hyperlipidemia, unspecified: Secondary | ICD-10-CM | POA: Diagnosis not present

## 2016-06-21 DIAGNOSIS — L578 Other skin changes due to chronic exposure to nonionizing radiation: Secondary | ICD-10-CM | POA: Diagnosis not present

## 2016-06-21 DIAGNOSIS — N289 Disorder of kidney and ureter, unspecified: Secondary | ICD-10-CM

## 2016-06-21 DIAGNOSIS — D696 Thrombocytopenia, unspecified: Secondary | ICD-10-CM

## 2016-06-21 DIAGNOSIS — R739 Hyperglycemia, unspecified: Secondary | ICD-10-CM

## 2016-06-21 DIAGNOSIS — Z0000 Encounter for general adult medical examination without abnormal findings: Secondary | ICD-10-CM | POA: Diagnosis not present

## 2016-06-21 DIAGNOSIS — I10 Essential (primary) hypertension: Secondary | ICD-10-CM | POA: Diagnosis not present

## 2016-06-21 DIAGNOSIS — G20 Parkinson's disease: Secondary | ICD-10-CM | POA: Diagnosis not present

## 2016-06-21 DIAGNOSIS — N189 Chronic kidney disease, unspecified: Secondary | ICD-10-CM

## 2016-06-21 DIAGNOSIS — G20A1 Parkinson's disease without dyskinesia, without mention of fluctuations: Secondary | ICD-10-CM

## 2016-06-21 HISTORY — DX: Encounter for general adult medical examination without abnormal findings: Z00.00

## 2016-06-21 NOTE — Assessment & Plan Note (Signed)
hgba1c acceptable, minimize simple carbs. Increase exercise as tolerated.  

## 2016-06-21 NOTE — Assessment & Plan Note (Signed)
Patient encouraged to maintain heart healthy diet, regular exercise, adequate sleep. Consider daily probiotics. Take medications as prescribed. Given and reviewed copy of ACP documents from [place] Secretary of State and encouraged to complete and return 

## 2016-06-21 NOTE — Assessment & Plan Note (Signed)
Well controlled, no changes to meds. Encouraged heart healthy diet such as the DASH diet and exercise as tolerated.  °

## 2016-06-21 NOTE — Progress Notes (Signed)
Pre visit review using our clinic review tool, if applicable. No additional management support is needed unless otherwise documented below in the visit note. 

## 2016-06-21 NOTE — Assessment & Plan Note (Signed)
Secondary to h/o renal carcinoma and removal of right kidney. Creatinine stable. Continue to monitor

## 2016-06-21 NOTE — Patient Instructions (Signed)
Preventive Care 73 Years and Older, Male Preventive care refers to lifestyle choices and visits with your health care provider that can promote health and wellness. What does preventive care include?  A yearly physical exam. This is also called an annual well check.  Dental exams once or twice a year.  Routine eye exams. Ask your health care provider how often you should have your eyes checked.  Personal lifestyle choices, including:  Daily care of your teeth and gums.  Regular physical activity.  Eating a healthy diet.  Avoiding tobacco and drug use.  Limiting alcohol use.  Practicing safe sex.  Taking low doses of aspirin every day.  Taking vitamin and mineral supplements as recommended by your health care provider. What happens during an annual well check? The services and screenings done by your health care provider during your annual well check will depend on your age, overall health, lifestyle risk factors, and family history of disease. Counseling  Your health care provider may ask you questions about your:  Alcohol use.  Tobacco use.  Drug use.  Emotional well-being.  Home and relationship well-being.  Sexual activity.  Eating habits.  History of falls.  Memory and ability to understand (cognition).  Work and work environment. Screening  You may have the following tests or measurements:  Height, weight, and BMI.  Blood pressure.  Lipid and cholesterol levels. These may be checked every 5 years, or more frequently if you are over 50 years old.  Skin check.  Lung cancer screening. You may have this screening every year starting at age 55 if you have a 30-pack-year history of smoking and currently smoke or have quit within the past 15 years.  Fecal occult blood test (FOBT) of the stool. You may have this test every year starting at age 50.  Flexible sigmoidoscopy or colonoscopy. You may have a sigmoidoscopy every 5 years or a colonoscopy every 10  years starting at age 50.  Prostate cancer screening. Recommendations will vary depending on your family history and other risks.  Hepatitis C blood test.  Hepatitis B blood test.  Sexually transmitted disease (STD) testing.  Diabetes screening. This is done by checking your blood sugar (glucose) after you have not eaten for a while (fasting). You may have this done every 1-3 years.  Abdominal aortic aneurysm (AAA) screening. You may need this if you are a current or former smoker.  Osteoporosis. You may be screened starting at age 70 if you are at high risk. Talk with your health care provider about your test results, treatment options, and if necessary, the need for more tests. Vaccines  Your health care provider may recommend certain vaccines, such as:  Influenza vaccine. This is recommended every year.  Tetanus, diphtheria, and acellular pertussis (Tdap, Td) vaccine. You may need a Td booster every 10 years.  Varicella vaccine. You may need this if you have not been vaccinated.  Zoster vaccine. You may need this after age 60.  Measles, mumps, and rubella (MMR) vaccine. You may need at least one dose of MMR if you were born in 1957 or later. You may also need a second dose.  Pneumococcal 13-valent conjugate (PCV13) vaccine. One dose is recommended after age 73.  Pneumococcal polysaccharide (PPSV23) vaccine. One dose is recommended after age 73.  Meningococcal vaccine. You may need this if you have certain conditions.  Hepatitis A vaccine. You may need this if you have certain conditions or if you travel or work in places where   you may be exposed to hepatitis A.  Hepatitis B vaccine. You may need this if you have certain conditions or if you travel or work in places where you may be exposed to hepatitis B.  Haemophilus influenzae type b (Hib) vaccine. You may need this if you have certain risk factors. Talk to your health care provider about which screenings and vaccines you  need and how often you need them. This information is not intended to replace advice given to you by your health care provider. Make sure you discuss any questions you have with your health care provider. Document Released: 04/15/2015 Document Revised: 12/07/2015 Document Reviewed: 01/18/2015 Elsevier Interactive Patient Education  2017 [employer].

## 2016-06-21 NOTE — Assessment & Plan Note (Signed)
Doing stable follows with neurology

## 2016-06-21 NOTE — Progress Notes (Signed)
Patient ID: Vincent Walker, male   DOB: March 19, 1946, 71 y.o.   MRN: [id number]   Subjective:  I acted as a Education administrator for [name], [place], [place]   Patient ID: Vincent Walker, male    DOB: 11/24/1945, 71 y.o.   MRN: [id number]  Chief Complaint  Patient presents with  . Annual Exam  . Hypertension  . Hyperglycemia    Hypertension  This is a chronic problem. The problem is controlled. Pertinent negatives include no blurred vision, chest pain, headaches, malaise/fatigue, palpitations or shortness of breath. Risk factors for coronary artery disease include male gender.  Hyperglycemia  Pertinent negatives include no chest pain, congestion, coughing, fever, headaches, rash or vomiting.    Patient is in today for an annual examination. Patient had toe surgery on his left foot four weeks ago. Patient has a Hx of Parkinson's Disease, HTN, constipation, thrombocytopenia. Patient has no acute concerns noted at this time. His Parkinson's is stable. He's had no recent falls. He denies any acute febrile illness or recent hospitalizations. Did have surgery about 4 weeks ago on his toe and is healing well. No complaints of pain or fevers. Denies polyuria or polydipsia. Denies CP/palp/SOB/HA/congestion/fevers/GI or GU c/o. Taking meds as prescribed  Patient Care Team: Mosie Lukes, MD as PCP - General (Family Medicine)   Past Medical History:  Diagnosis Date  . Anxiety state 12/23/2015  . Benign essential HTN 02/08/2015  . Cancer [facility])    Kidney Cancer  . Chronic kidney disease   . Chronic renal insufficiency 02/14/2015  . Constipation 12/23/2015  . Dyslipidemia 02/08/2015  . H/O renal cell carcinoma 02/08/2015  . History of blood transfusion 2010   After Kidney surgery  . History of chicken pox   . Hyperglycemia 12/23/2015  . Hyperlipidemia   . Hypertension   . Ingrown left big toenail 02/14/2015  . Left foot pain 02/14/2015  . Mitral valve prolapse   . MVP (mitral valve prolapse) 05/14/2016  .  Parkinson disease ([place]) 12/23/2015  . Preventative health care 06/21/2016  . Thrombocytopenia ([place]) 02/08/2015  . Tremor of right hand 02/14/2015    Past Surgical History:  Procedure Laterality Date  . APPENDECTOMY  1995  . left knee scope  2003  . right knee scope  1999  . TOE SURGERY    . TONSILLECTOMY    . TOTAL KNEE ARTHROPLASTY Left 07/06/2014  . TOTAL NEPHRECTOMY Right     Family History  Problem Relation Age of Onset  . Alcohol abuse Father   . Hyperlipidemia Father   . Hypertension Father   . Diabetes Father   . Heart disease Father   . Cancer Maternal Aunt     lung cancer  . Cancer Paternal Uncle     bone cancer  . Heart attack Mother   . Stroke Mother     swelling in brain stem    Social History   Social History  . Marital status: Married    Spouse name: N/A  . Number of children: N/A  . Years of education: 55   Occupational History  . 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 Smoking status: Former Smoker    Types: Pipe    Quit date: 11/18/1995  . Smokeless tobacco: Never Used     Comment: stopped 20 years ago.  1996  . Alcohol use 0.0 oz/week     Comment: once a week   . Drug use: No  .  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 Not on file   Social History Narrative  . No narrative on file    Outpatient Medications Prior to Visit  Medication Sig Dispense Refill  . amLODipine (NORVASC) 10 MG tablet Take 1 tablet (10 mg total) by mouth daily. 30 tablet 5  . aspirin 81 MG tablet Take 81 mg by mouth daily.    Marland Kitchen atenolol (TENORMIN) 50 MG tablet Take 1 tablet (50 mg total) by mouth daily. 90 tablet 1  . atorvastatin (LIPITOR) 20 MG tablet TAKE 1 TABLET (20 MG TOTAL) BY MOUTH DAILY. 90 tablet 0  . carbidopa-levodopa (SINEMET CR) 50-200 MG tablet Take 1 tablet by mouth at bedtime. 30 tablet 5  .  carbidopa-levodopa (SINEMET IR) 25-100 MG tablet Take 1 tablet by mouth 3 (three) times daily. 270 tablet 1  . escitalopram (LEXAPRO) 10 MG tablet Take 1 tablet (10 mg total) by mouth daily. 30 tablet 5  . latanoprost (XALATAN) 0.005 % ophthalmic solution PLACE 1 DROP INTO BOTH EYES NIGHTLY.    . LORazepam (ATIVAN) 1 MG tablet Take 1 mg by mouth daily as needed for anxiety.    . Multiple Vitamins-Minerals (CENTRUM SILVER PO) Take by mouth daily.     No facility-administered medications prior to visit.     Allergies  Allergen Reactions  . Sulfa Antibiotics Other (See Comments)    Had a reaction as a child    Review of Systems  Constitutional: Negative for fever and malaise/fatigue.  HENT: Negative for congestion.   Eyes: Negative for blurred vision.  Respiratory: Negative for cough and shortness of breath.   Cardiovascular: Negative for chest pain, palpitations and leg swelling.  Gastrointestinal: Negative for vomiting.  Musculoskeletal: Negative for back pain.  Skin: Negative for rash.  Neurological: Negative for loss of consciousness and headaches.       Objective:    Physical Exam  Constitutional: He is oriented to person, place, and time. He appears well-developed and well-nourished. No distress.  HENT:  Head: Normocephalic and atraumatic.  Eyes: Conjunctivae are normal.  Neck: Normal range of motion. No thyromegaly present.  Cardiovascular: Normal rate and regular rhythm.   Pulmonary/Chest: Effort normal and breath sounds normal. He has no wheezes.  Abdominal: Soft. Bowel sounds are normal. There is no tenderness.  Musculoskeletal: He exhibits no edema or deformity.  Neurological: He is alert and oriented to person, place, and time.  Skin: Skin is warm and dry. He is not diaphoretic.  Psychiatric: He has a normal mood and affect.    BP 115/61 (BP Location: Left Arm, Patient Position: Sitting, Cuff Size: Large)   Pulse 65   Temp 97.8 F (36.6 C) (Oral)   Ht 6\' 3"   (1.905 m)   Wt 241 lb (109.3 kg)   SpO2 98% Comment: RA  BMI 30.12 kg/m  Wt Readings from Last 3 Encounters:  06/21/16 241 lb (109.3 kg)  05/22/16 242 lb (109.8 kg)  02/20/16 242 lb (109.8 kg)   BP Readings from Last 3 Encounters:  06/21/16 115/61  05/22/16 110/70  02/20/16 124/84     Immunization History  Administered Date(s) Administered  . Influenza, High Dose Seasonal PF 12/23/2015  . Influenza-Unspecified 02/01/2015    Health Maintenance  Topic Date Due  . Hepatitis C Screening  1945/12/29  . TETANUS/TDAP  05/09/1962  . PNA vac Low  Risk Adult (1 of 2 - PCV13) 05/09/2008  . COLONOSCOPY  02/07/2021  . INFLUENZA VACCINE  Completed    Lab Results  Component Value Date   WBC 7.9 05/01/2016   HGB 15.5 05/01/2016   HCT 45.2 05/01/2016   PLT 174.0 05/01/2016   GLUCOSE 110 (H) 05/01/2016   CHOL 117 05/01/2016   TRIG 181.0 (H) 05/01/2016   HDL 42.20 05/01/2016   LDLCALC 38 05/01/2016   ALT 6 05/01/2016   AST 19 05/01/2016   NA 138 05/01/2016   K 4.4 05/01/2016   CL 107 05/01/2016   CREATININE 1.58 (H) 05/01/2016   BUN 19 05/01/2016   CO2 26 05/01/2016   TSH 3.18 05/01/2016   HGBA1C 6.0 05/01/2016    Lab Results  Component Value Date   TSH 3.18 05/01/2016   Lab Results  Component Value Date   WBC 7.9 05/01/2016   HGB 15.5 05/01/2016   HCT 45.2 05/01/2016   MCV 91.5 05/01/2016   PLT 174.0 05/01/2016   Lab Results  Component Value Date   NA 138 05/01/2016   K 4.4 05/01/2016   CO2 26 05/01/2016   GLUCOSE 110 (H) 05/01/2016   BUN 19 05/01/2016   CREATININE 1.58 (H) 05/01/2016   BILITOT 0.7 05/01/2016   ALKPHOS 74 05/01/2016   AST 19 05/01/2016   ALT 6 05/01/2016   PROT 7.2 05/01/2016   ALBUMIN 4.5 05/01/2016   CALCIUM 9.6 05/01/2016   GFR 45.92 (L) 05/01/2016   Lab Results  Component Value Date   CHOL 117 05/01/2016   Lab Results  Component Value Date   HDL 42.20 05/01/2016   Lab Results  Component Value Date   LDLCALC 38 05/01/2016    Lab Results  Component Value Date   TRIG 181.0 (H) 05/01/2016   Lab Results  Component Value Date   CHOLHDL 3 05/01/2016   Lab Results  Component Value Date   HGBA1C 6.0 05/01/2016         Assessment & Plan:   Problem List Items Addressed This Visit    Benign essential HTN    Well controlled, no changes to meds. Encouraged heart healthy diet such as the DASH diet and exercise as tolerated.       Relevant Orders   CBC   Comprehensive metabolic panel   TSH   Dyslipidemia    Encouraged heart healthy diet, increase exercise, avoid trans fats, consider a krill oil cap daily      Relevant Orders   Lipid panel   Thrombocytopenia (HCC)    Mild, asymptomatic      Chronic renal insufficiency    Secondary to h/o renal carcinoma and removal of right kidney. Creatinine stable. Continue to monitor      Parkinson disease ([place])    Doing stable follows with neurology      Hyperglycemia    hgba1c acceptable, minimize simple carbs. Increase exercise as tolerated      Relevant Orders   Hemoglobin A1c   Preventative health care    Patient encouraged to maintain heart healthy diet, regular exercise, adequate sleep. Consider daily probiotics. Take medications as prescribed. Given and reviewed copy of ACP documents from [place] and encouraged to complete and return      Relevant Orders   Hemoglobin A1c   CBC   Comprehensive metabolic panel   Lipid panel   TSH    Other Visit Diagnoses    Sun-damaged skin    -  Primary  Relevant Orders   Ambulatory referral to Dermatology      I am having Mr. Speegle maintain his aspirin, Multiple Vitamins-Minerals (CENTRUM SILVER PO), LORazepam, latanoprost, atenolol, carbidopa-levodopa, escitalopram, carbidopa-levodopa, amLODipine, and atorvastatin.  No orders of the defined types were placed in this encounter.   CMA served as Education administrator during this visit. History, Physical and Plan performed by medical provider.  Documentation and orders reviewed and attested to.  [name], MD

## 2016-06-21 NOTE — Assessment & Plan Note (Signed)
Mild, asymptomatic, with leukopenia. 

## 2016-06-21 NOTE — Assessment & Plan Note (Signed)
Encouraged heart healthy diet, increase exercise, avoid trans fats, consider a krill oil cap daily 

## 2016-06-22 DIAGNOSIS — M79672 Pain in left foot: Secondary | ICD-10-CM | POA: Diagnosis not present

## 2016-06-27 ENCOUNTER — Other Ambulatory Visit: Payer: Self-pay | Admitting: Family Medicine

## 2016-06-27 MED FILL — ATENOLOL 100 MG TAB: 100 | 90 days supply | Qty: 45 | Fill #0

## 2016-07-05 DIAGNOSIS — N183 Chronic kidney disease, stage 3 (moderate): Secondary | ICD-10-CM | POA: Diagnosis not present

## 2016-07-05 DIAGNOSIS — Z85528 Personal history of other malignant neoplasm of kidney: Secondary | ICD-10-CM | POA: Diagnosis not present

## 2016-07-05 DIAGNOSIS — N400 Enlarged prostate without lower urinary tract symptoms: Secondary | ICD-10-CM | POA: Diagnosis not present

## 2016-07-05 DIAGNOSIS — Z905 Acquired absence of kidney: Secondary | ICD-10-CM | POA: Diagnosis not present

## 2016-07-17 MED FILL — ESCITALOPRAM 10 MG TABLET: 10 | 30 days supply | Qty: 30 | Fill #5

## 2016-07-24 DIAGNOSIS — L570 Actinic keratosis: Secondary | ICD-10-CM | POA: Diagnosis not present

## 2016-08-16 ENCOUNTER — Other Ambulatory Visit: Payer: Self-pay | Admitting: Neurology

## 2016-08-16 MED FILL — ESCITALOPRAM 10 MG TABLET: 10 | 30 days supply | Qty: 30 | Fill #0

## 2016-08-23 ENCOUNTER — Other Ambulatory Visit: Payer: Self-pay | Admitting: Neurology

## 2016-08-23 DIAGNOSIS — G20 Parkinson's disease: Secondary | ICD-10-CM

## 2016-08-23 MED ORDER — CARBIDOPA-LEVODOPA 25-100 MG PO TABS: 1.0000 | ORAL_TABLET | Freq: Three times a day (TID) | ORAL | 0 refills | Status: DC

## 2016-09-07 DIAGNOSIS — H01002 Unspecified blepharitis right lower eyelid: Secondary | ICD-10-CM | POA: Diagnosis not present

## 2016-09-07 DIAGNOSIS — H01005 Unspecified blepharitis left lower eyelid: Secondary | ICD-10-CM | POA: Diagnosis not present

## 2016-09-07 DIAGNOSIS — H401132 Primary open-angle glaucoma, bilateral, moderate stage: Secondary | ICD-10-CM | POA: Diagnosis not present

## 2016-09-07 DIAGNOSIS — H01004 Unspecified blepharitis left upper eyelid: Secondary | ICD-10-CM | POA: Diagnosis not present

## 2016-09-07 DIAGNOSIS — Z83511 Family history of glaucoma: Secondary | ICD-10-CM | POA: Diagnosis not present

## 2016-09-07 DIAGNOSIS — H01001 Unspecified blepharitis right upper eyelid: Secondary | ICD-10-CM | POA: Diagnosis not present

## 2016-09-17 ENCOUNTER — Other Ambulatory Visit: Payer: Self-pay | Admitting: Family Medicine

## 2016-09-17 MED FILL — ESCITALOPRAM 10 MG TABLET: 10 | 30 days supply | Qty: 30 | Fill #1

## 2016-09-17 MED FILL — ATORVASTATIN 20 MG TABLET: 20 | 90 days supply | Qty: 90 | Fill #0

## 2016-09-17 MED FILL — ATENOLOL 100 MG TAB: 100 | 90 days supply | Qty: 45 | Fill #1

## 2016-09-18 NOTE — Progress Notes (Signed)
Vincent Walker was seen today in the movement disorders clinic for neurologic consultation at the request of Mosie Lukes, MD.  The consultation is for the evaluation of R hand tremor and shuffling.  This patient is accompanied in the office by his spouse who supplements the history.  02/17/16 update:  The patient was diagnosed with Parkinson's disease last visit and started on carbidopa/levodopa 25/100, one tablet 3 times per day (7am/11:30am/bedtime).  States that he dosed it this way because he slept better with the bedtime dosing.  The patient had an MRI of the brain on 11/30/2015 with and without gadolinium.  I had the opportunity to review this.  It was normal.  In regards to his restless leg syndrome, the patient states that he is doing well.  In regards to mood, he states that it isn't good and there are mood swings.  They watch the grandkids and the noise and lack of peace will irritate him.  He doesn't have energy and he can't do the things that he used to be able to do.  Pt denies falls.  Pt denies lightheadedness, near syncope.  No hallucinations.  He is on stationary bike at home 3-5 times per week.    05/22/16 update:  Patient follows up today.  He is on carbidopa/levodopa 25/100, one tablet 3 times per day.  Overall, the patient feels he has been stable. Had 4-5 episodes since our last visit of RLS.  Is on CR q hs.   Denies falls.  Denies lightheadedness or near syncope.  No hallucinations.  Mood has been good.  Continues to ride his exercise bike but not as much because of foot pain.  Having hammertoe surgery on 05/24/2016.  Was already given RX for zofran.     09/25/16 update:  Patient today in follow-up.  He is on carbidopa/levodopa 25/100, one tablet 3 times per day (7-8am; 11:30-1pm; 5:30-6:30pm) and last visit I told him to take his carbidopa/levodopa 50/200 about an hour before bedtime because of his restless leg syndrome.  His wife states that as long as he takes it at 8:30-9, his  RLS is well controlled.  He is on lexapro, which can make RLS a little worse, but it is helping mood.  Pt denies falls.  Pt denies lightheadedness, near syncope.  No hallucinations.  Mood has been good.  He is riding his bike 3-4 days per week.  He had hammertoe surgery on the L and he did really well.   PREVIOUS MEDICATIONS: zoloft (felt more depressed when started taking that - only 25 mg and didn't take for long)  ALLERGIES:   Allergies  Allergen Reactions  . Sulfa Antibiotics Other (See Comments)    Had a reaction as a child    CURRENT MEDICATIONS:  Outpatient Encounter Prescriptions as of 09/25/2016  Medication Sig  . amLODipine (NORVASC) 10 MG tablet Take 1 tablet (10 mg total) by mouth daily.  Marland Kitchen aspirin 81 MG tablet Take 81 mg by mouth daily.  Marland Kitchen atenolol (TENORMIN) 50 MG tablet Take 1 tablet (50 mg total) by mouth daily.  Marland Kitchen atorvastatin (LIPITOR) 20 MG tablet TAKE 1 TABLET (20 MG TOTAL) BY MOUTH DAILY.  . carbidopa-levodopa (SINEMET CR) 50-200 MG tablet TAKE ONE TABLET AT BEDTIME.  . carbidopa-levodopa (SINEMET IR) 25-100 MG tablet Take 1 tablet by mouth 3 (three) times daily.  Marland Kitchen escitalopram (LEXAPRO) 10 MG tablet TAKE 1 TABLET (10 MG TOTAL) BY MOUTH DAILY.  Marland Kitchen latanoprost (XALATAN) 0.005 %  ophthalmic solution PLACE 1 DROP INTO BOTH EYES NIGHTLY.  . LORazepam (ATIVAN) 1 MG tablet Take 1 mg by mouth daily as needed for anxiety.  . Multiple Vitamins-Minerals (CENTRUM SILVER PO) Take by mouth daily.  . [DISCONTINUED] atenolol (TENORMIN) 100 MG tablet TAKE 1/2 TABLET BY MOUTH DAILY   No facility-administered encounter medications on file as of 09/25/2016.     PAST MEDICAL HISTORY:   Past Medical History:  Diagnosis Date  . Anxiety state 12/23/2015  . Benign essential HTN 02/08/2015  . Cancer [facility])    Kidney Cancer  . Chronic kidney disease   . Chronic renal insufficiency 02/14/2015  . Constipation 12/23/2015  . Dyslipidemia 02/08/2015  . H/O renal cell carcinoma 02/08/2015  .  History of blood transfusion 2010   After Kidney surgery  . History of chicken pox   . Hyperglycemia 12/23/2015  . Hyperlipidemia   . Hypertension   . Ingrown left big toenail 02/14/2015  . Left foot pain 02/14/2015  . Mitral valve prolapse   . MVP (mitral valve prolapse) 05/14/2016  . Parkinson disease ([place]) 12/23/2015  . Preventative health care 06/21/2016  . Thrombocytopenia ([place]) 02/08/2015  . Tremor of right hand 02/14/2015    PAST SURGICAL HISTORY:   Past Surgical History:  Procedure Laterality Date  . APPENDECTOMY  1995  . left knee scope  2003  . right knee scope  1999  . TOE SURGERY    . TONSILLECTOMY    . TOTAL KNEE ARTHROPLASTY Left 07/06/2014  . TOTAL NEPHRECTOMY Right     SOCIAL HISTORY:   Social History   Social History  . Marital status: Married    Spouse name: N/A  . Number of children: N/A  . Years of education: 48   Occupational History  . 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 Smoking status: Former Smoker    Types: Pipe    Quit date: 11/18/1995  . Smokeless tobacco: Never Used     Comment: stopped 20 years ago.  1996  . Alcohol use 0.0 oz/week     Comment: once a week   . Drug use: No  . 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 Not on file   Social History Narrative  . No narrative on file    FAMILY HISTORY:   Family Status  Relation Status  . Father Deceased  . Mat Aunt Deceased  . Annamarie Major Deceased  . Mother Deceased  . Daughter Alive  . Son Alive  . MGM Deceased at age 55  . MGF Deceased at age 55  . PGM Deceased at age 30  . PGF Deceased at age 73  . Daughter Alive    ROS:  A complete 10 system review of systems was obtained and was unremarkable apart from what is mentioned above.  PHYSICAL EXAMINATION:    VITALS:   Vitals:   09/25/16 1026  BP: 128/70  Pulse: 64  SpO2:  99%  Weight: 243 lb 4 oz (110.3 kg)  Height: 6\' 3"  (1.905 m)    GEN:  The patient appears stated age and is in NAD. HEENT:  Normocephalic, atraumatic.  The mucous membranes are moist. The superficial temporal arteries are without ropiness or tenderness. CV:  RRR Lungs:  CTAB Neck/HEME:  There are no carotid bruits bilaterally.  Neurological examination:  Orientation: The patient is alert and oriented x3.  Cranial nerves: There is good facial symmetry. There is facial hypomimia.  Pupils are equal round and reactive to light bilaterally. Fundoscopic exam reveals clear margins bilaterally. Extraocular muscles are intact. The visual fields are full to confrontational testing. The speech is fluent and clear. Soft palate rises symmetrically and there is no tongue deviation. Hearing is slightly decreased to conversational tone. Sensation: Sensation is intact to light and pinprick throughout (facial, trunk, extremities). Vibration is intact at the bilateral big toe. There is no extinction with double simultaneous stimulation. There is no sensory dermatomal level identified. Motor: Strength is 5/5 in the bilateral upper and lower extremities.   Shoulder shrug is equal and symmetric.  There is no pronator drift.  Movement examination: Tone: There is mild increased tone in the RUE (seen close to 11am).   Abnormal movements: There is an intermittent RUE resting tremor with distraction and with ambulation Coordination:  There is good RAMs with any form of RAMS, including alternating supination and pronation of the forearm, hand opening and closing, finger taps, heel taps and toe taps. Gait and Station: The patient has no difficulty arising out of a deep-seated chair without the use of the hands. The patient's stride length is normal with decreased arm swing on the right.  He has re-emergent tremor on the right.  The patient has a negative pull test.      LABS    Chemistry      Component Value  Date/Time   NA 138 05/01/2016 0925   K 4.4 05/01/2016 0925   CL 107 05/01/2016 0925   CO2 26 05/01/2016 0925   BUN 19 05/01/2016 0925   CREATININE 1.58 (H) 05/01/2016 0925      Component Value Date/Time   CALCIUM 9.6 05/01/2016 0925   ALKPHOS 74 05/01/2016 0925   AST 19 05/01/2016 0925   ALT 6 05/01/2016 0925   BILITOT 0.7 05/01/2016 0925     Lab Results  Component Value Date   WBC 7.9 05/01/2016   HGB 15.5 05/01/2016   HCT 45.2 05/01/2016   MCV 91.5 05/01/2016   PLT 174.0 05/01/2016   Lab Results  Component Value Date   VITAMINB12 886 11/18/2015     Lab Results  Component Value Date   IRON 71 11/18/2015    Lab Results  Component Value Date   TSH 3.18 05/01/2016     ASSESSMENT/PLAN:  1.  Idiopathic Parkinson's disease.  He is Hoehn and Yoehr stage 1.  This is a new dx on 11/18/15. The patient has tremor, bradykinesia, rigidity and mild postural instability.  -We discussed the diagnosis as well as pathophysiology of the disease.  We discussed treatment options as well as prognostic indicators.  Patient education was provided.  -He will continue his carbidopa/levodopa 25/100, one tablet 3 times per day.  Was little underdosed today but is about time for med.  Will continue to closely monitor that  -continue carbidopa/levodopa 50/200 1 hour prior to bedtime due to RLS at bedtime  -is exercising on bike at home.  Talked about getting involved with [facility] and benefits of that program  2.  RLS  -The levodopa has helped 1 hour prior to bed  3.  Depression and anxiety  -Can be associated with Parkinson's disease.    -continue lexapro - 10 mg q day.  Could be making RLS worse but benefits outweigh risks  4.  We will follow-up with him in the 4  months, sooner should new neurologic issues arise.  Much greater than 50% of this visit was spent in counseling and coordinating care.  Total face to face time:  25 min

## 2016-09-25 ENCOUNTER — Ambulatory Visit (INDEPENDENT_AMBULATORY_CARE_PROVIDER_SITE_OTHER): Payer: Medicare Other | Admitting: Neurology

## 2016-09-25 ENCOUNTER — Encounter: Payer: Self-pay | Admitting: Neurology

## 2016-09-25 VITALS — BP 128/70 | HR 64 | Ht 75.0 in | Wt 243.2 lb

## 2016-09-25 DIAGNOSIS — G20 Parkinson's disease: Secondary | ICD-10-CM | POA: Diagnosis not present

## 2016-09-25 DIAGNOSIS — G2581 Restless legs syndrome: Secondary | ICD-10-CM | POA: Diagnosis not present

## 2016-10-15 MED FILL — ESCITALOPRAM 10 MG TABLET: 10 | 30 days supply | Qty: 30 | Fill #2

## 2016-11-14 MED FILL — ESCITALOPRAM 10 MG TABLET: 10 | 30 days supply | Qty: 30 | Fill #3

## 2016-11-16 ENCOUNTER — Other Ambulatory Visit: Payer: Self-pay | Admitting: Neurology

## 2016-11-16 DIAGNOSIS — G20 Parkinson's disease: Secondary | ICD-10-CM

## 2016-12-10 DIAGNOSIS — Z905 Acquired absence of kidney: Secondary | ICD-10-CM | POA: Diagnosis not present

## 2016-12-10 DIAGNOSIS — N189 Chronic kidney disease, unspecified: Secondary | ICD-10-CM | POA: Diagnosis not present

## 2016-12-10 DIAGNOSIS — Z79899 Other long term (current) drug therapy: Secondary | ICD-10-CM | POA: Diagnosis not present

## 2016-12-10 DIAGNOSIS — Z23 Encounter for immunization: Secondary | ICD-10-CM | POA: Diagnosis not present

## 2016-12-10 DIAGNOSIS — I129 Hypertensive chronic kidney disease with stage 1 through stage 4 chronic kidney disease, or unspecified chronic kidney disease: Secondary | ICD-10-CM | POA: Diagnosis not present

## 2016-12-10 DIAGNOSIS — E785 Hyperlipidemia, unspecified: Secondary | ICD-10-CM | POA: Diagnosis not present

## 2016-12-10 DIAGNOSIS — N183 Chronic kidney disease, stage 3 (moderate): Secondary | ICD-10-CM | POA: Diagnosis not present

## 2016-12-10 DIAGNOSIS — Z85528 Personal history of other malignant neoplasm of kidney: Secondary | ICD-10-CM | POA: Diagnosis not present

## 2016-12-10 DIAGNOSIS — Z7982 Long term (current) use of aspirin: Secondary | ICD-10-CM | POA: Diagnosis not present

## 2016-12-11 ENCOUNTER — Encounter: Payer: Self-pay | Admitting: Medical

## 2016-12-11 ENCOUNTER — Ambulatory Visit (INDEPENDENT_AMBULATORY_CARE_PROVIDER_SITE_OTHER): Payer: Medicare Other | Admitting: Medical

## 2016-12-11 ENCOUNTER — Telehealth: Payer: Self-pay | Admitting: Medical

## 2016-12-11 ENCOUNTER — Ambulatory Visit (HOSPITAL_BASED_OUTPATIENT_CLINIC_OR_DEPARTMENT_OTHER)
Admission: RE | Admit: 2016-12-11 | Discharge: 2016-12-11 | Disposition: A | Discharge status: Home / Self Care | Payer: Medicare Other | Source: Ambulatory Visit | Attending: Medical | Admitting: Medical

## 2016-12-11 VITALS — BP 130/77 | HR 64 | Temp 97.6°F | Resp 16 | Ht 72.0 in | Wt 243.8 lb

## 2016-12-11 DIAGNOSIS — M1611 Unilateral primary osteoarthritis, right hip: Secondary | ICD-10-CM | POA: Insufficient documentation

## 2016-12-11 DIAGNOSIS — M25551 Pain in right hip: Secondary | ICD-10-CM

## 2016-12-11 NOTE — Telephone Encounter (Signed)
Referral to sports medicine placed. Requesting he be seen by 12/13/2016 in the morning. If possible.

## 2016-12-11 NOTE — Patient Instructions (Signed)
For your rt hip area pain will get xray of your rt hip today. Please get xray now.  For severe pain use your tramadol you have at home if needed. Mild pain use tylenol.  Evaluate joint space. If looks normal but pain persists then refer to sports medicine.  Follow up date to be determined after xray review.

## 2016-12-11 NOTE — Progress Notes (Signed)
Subjective:    Patient ID: Vincent Walker, male    DOB: Feb 17, 1946, 71 y.o.   MRN: [id number]  HPI   Pt in with some hip pain. Pain level varies at different times. He stats pain level can be severe and intense. Will occur randomly. Pain for about a month.   The first time he noticed pain was working in garden. Seemed to occur day or so after fall. But at time of the fall he had not pain. For about a week and half daily pain and then subsided completley but then about 2 weeks ago pain returned.   If walks bent over has less pain.  No rt knee.   Pt states pain level 9/10 at times. But mostly time patient is lower level type pain. For example ascribes tw's o out of 10 level pain sitting. When his high level pain dissipates he describes pain as 4 out of 10.    Review of Systems  Constitutional: Negative for chills, fatigue and fever.  Respiratory: Negative for cough, chest tightness, shortness of breath and wheezing.   Cardiovascular: Negative for chest pain and palpitations.  Gastrointestinal: Negative for abdominal pain.  Musculoskeletal: Negative for arthralgias, back pain, gait problem, myalgias and neck stiffness.       Rt hip pain.  Skin: Negative for pallor and rash.  Neurological: Negative for dizziness and headaches.  Hematological: Negative for adenopathy. Does not bruise/bleed easily.  Psychiatric/Behavioral: Negative for behavioral problems, confusion, decreased concentration, dysphoric mood and suicidal ideas. The patient is not nervous/anxious.     Past Medical History:  Diagnosis Date  . Anxiety state 12/23/2015  . Benign essential HTN 02/08/2015  . Cancer [facility])    Kidney Cancer  . Chronic kidney disease   . Chronic renal insufficiency 02/14/2015  . Constipation 12/23/2015  . Dyslipidemia 02/08/2015  . H/O renal cell carcinoma 02/08/2015  . History of blood transfusion 2010   After Kidney surgery  . History of chicken pox   . Hyperglycemia 12/23/2015  .  Hyperlipidemia   . Hypertension   . Ingrown left big toenail 02/14/2015  . Left foot pain 02/14/2015  . Mitral valve prolapse   . MVP (mitral valve prolapse) 05/14/2016  . Parkinson disease ([place]) 12/23/2015  . Preventative health care 06/21/2016  . Thrombocytopenia ([place]) 02/08/2015  . Tremor of right hand 02/14/2015     Social History   Social History  . Marital status: Married    Spouse name: N/A  . Number of children: N/A  . Years of education: 68   Occupational History  . 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 Smoking status: Former Smoker    Types: Pipe    Quit date: 11/18/1995  . Smokeless tobacco: Never Used     Comment: stopped 20 years ago.  1996  . Alcohol use 0.0 oz/week     Comment: once a week   . Drug use: No  . 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 Not on file   Social History Narrative  . No narrative on file    Past Surgical History:  Procedure Laterality Date  . APPENDECTOMY  1995  . left knee scope  2003  . right knee scope  1999  . TOE SURGERY    . TONSILLECTOMY    . TOTAL KNEE  ARTHROPLASTY Left 07/06/2014  . TOTAL NEPHRECTOMY Right     Family History  Problem Relation Age of Onset  . Alcohol abuse Father   . Hyperlipidemia Father   . Hypertension Father   . Diabetes Father   . Heart disease Father   . Cancer Maternal Aunt        lung cancer  . Cancer Paternal Uncle        bone cancer  . Heart attack Mother   . Stroke Mother        swelling in brain stem    Allergies  Allergen Reactions  . Sulfa Antibiotics Other (See Comments)    Had a reaction as a child    Current Outpatient Prescriptions on File Prior to Visit  Medication Sig Dispense Refill  . amLODipine (NORVASC) 10 MG tablet Take 1 tablet (10 mg total) by mouth daily. 30 tablet 5  . aspirin 81 MG tablet Take 81 mg by  mouth daily.    Marland Kitchen atenolol (TENORMIN) 50 MG tablet Take 1 tablet (50 mg total) by mouth daily. 90 tablet 1  . atorvastatin (LIPITOR) 20 MG tablet TAKE 1 TABLET (20 MG TOTAL) BY MOUTH DAILY. 90 tablet 0  . carbidopa-levodopa (SINEMET CR) 50-200 MG tablet TAKE ONE TABLET AT BEDTIME. 30 tablet 5  . carbidopa-levodopa (SINEMET IR) 25-100 MG tablet TAKE 1 TABLET BY MOUTH THREE TIMES DAILY 270 tablet 0  . escitalopram (LEXAPRO) 10 MG tablet TAKE 1 TABLET (10 MG TOTAL) BY MOUTH DAILY. 30 tablet 5  . latanoprost (XALATAN) 0.005 % ophthalmic solution PLACE 1 DROP INTO BOTH EYES NIGHTLY.    . LORazepam (ATIVAN) 1 MG tablet Take 1 mg by mouth daily as needed for anxiety.    . Multiple Vitamins-Minerals (CENTRUM SILVER PO) Take by mouth daily.     No current facility-administered medications on file prior to visit.     BP 130/77   Pulse 64   Temp 97.6 F (36.4 C) (Oral)   Resp 16   Ht 6' (1.829 m)   Wt 243 lb 12.8 oz (110.6 kg)   SpO2 95%   BMI 33.07 kg/m       Objective:   Physical Exam  General- No acute distress. Pleasant patient. Neck- Full range of motion, no jvd Lungs- Clear, even and unlabored. Heart- regular rate and rhythm. Neurologic- CNII- XII grossly intact.   Rt hip- mild hip area pain on palpation and rom. No crepitus.  Rt knee- no hip crepitus on palpation. No warmth or tenderness, no instability.      Assessment & Plan:  For your rt hip area pain will get xray of your rt hip today. Please get xray now.  For severe pain use your tramadol you have at home if needed. Mild pain use tylenol.  Evaluate joint space. If looks normal but pain persists then refer to sports medicine.  Follow up date to be determined after xray review.  Brendy Ficek, Vincent Miller, PA-C

## 2016-12-12 ENCOUNTER — Other Ambulatory Visit: Payer: Self-pay | Admitting: Family Medicine

## 2016-12-12 ENCOUNTER — Encounter: Payer: Self-pay | Admitting: Family Medicine

## 2016-12-12 ENCOUNTER — Ambulatory Visit (INDEPENDENT_AMBULATORY_CARE_PROVIDER_SITE_OTHER): Payer: Medicare Other | Admitting: Family Medicine

## 2016-12-12 DIAGNOSIS — M25551 Pain in right hip: Secondary | ICD-10-CM | POA: Diagnosis not present

## 2016-12-12 HISTORY — DX: Pain in right hip: M25.551

## 2016-12-12 MED FILL — ATORVASTATIN 20 MG TABLET: 20 | 90 days supply | Qty: 90 | Fill #0

## 2016-12-12 MED FILL — ATENOLOL 100 MG TAB: 100 | 90 days supply | Qty: 45 | Fill #2

## 2016-12-12 MED FILL — ESCITALOPRAM 10 MG TABLET: 10 | 30 days supply | Qty: 30 | Fill #4

## 2016-12-12 NOTE — Progress Notes (Signed)
PCP: Mosie Lukes, MD Consultation requested by: Mackie Pai PA-C  Subjective:   HPI: Patient is a 71 y.o. male here for right hip pain.  Patient reports he's had several year history of right hip pain but usually goes away on its own. Past 2 months pain has been more consistent and current issue has lasted over 2 weeks. Pain is a constant soreness but can be sharp and up to 10/10, stop him in his tracks and feel like his leg is going to buckle. No acute injury or trauma. Pain is posterior in buttock but can radiate down to ankle. He cannot take NSAIDs with renal history and doesn't tolerate pain medication well. No skin changes, numbness. No back pain or groin pain.  Past Medical History:  Diagnosis Date  . Anxiety state 12/23/2015  . Benign essential HTN 02/08/2015  . Cancer [facility])    Kidney Cancer  . Chronic kidney disease   . Chronic renal insufficiency 02/14/2015  . Constipation 12/23/2015  . Dyslipidemia 02/08/2015  . H/O renal cell carcinoma 02/08/2015  . History of blood transfusion 2010   After Kidney surgery  . History of chicken pox   . Hyperglycemia 12/23/2015  . Hyperlipidemia   . Hypertension   . Ingrown left big toenail 02/14/2015  . Left foot pain 02/14/2015  . Mitral valve prolapse   . MVP (mitral valve prolapse) 05/14/2016  . Parkinson disease ([place]) 12/23/2015  . Preventative health care 06/21/2016  . Thrombocytopenia ([place]) 02/08/2015  . Tremor of right hand 02/14/2015    Current Outpatient Prescriptions on File Prior to Visit  Medication Sig Dispense Refill  . amLODipine (NORVASC) 10 MG tablet Take 1 tablet (10 mg total) by mouth daily. 30 tablet 5  . aspirin 81 MG tablet Take 81 mg by mouth daily.    Marland Kitchen atenolol (TENORMIN) 50 MG tablet Take 1 tablet (50 mg total) by mouth daily. 90 tablet 1  . atorvastatin (LIPITOR) 20 MG tablet TAKE 1 TABLET BY MOUTH DAILY 90 tablet 0  . carbidopa-levodopa (SINEMET CR) 50-200 MG tablet TAKE ONE TABLET AT BEDTIME. 30  tablet 5  . carbidopa-levodopa (SINEMET IR) 25-100 MG tablet TAKE 1 TABLET BY MOUTH THREE TIMES DAILY 270 tablet 0  . escitalopram (LEXAPRO) 10 MG tablet TAKE 1 TABLET (10 MG TOTAL) BY MOUTH DAILY. 30 tablet 5  . latanoprost (XALATAN) 0.005 % ophthalmic solution PLACE 1 DROP INTO BOTH EYES NIGHTLY.    . LORazepam (ATIVAN) 1 MG tablet Take 1 mg by mouth daily as needed for anxiety.    . Multiple Vitamins-Minerals (CENTRUM SILVER PO) Take by mouth daily.     No current facility-administered medications on file prior to visit.     Past Surgical History:  Procedure Laterality Date  . APPENDECTOMY  1995  . left knee scope  2003  . right knee scope  1999  . TOE SURGERY    . TONSILLECTOMY    . TOTAL KNEE ARTHROPLASTY Left 07/06/2014  . TOTAL NEPHRECTOMY Right     Allergies  Allergen Reactions  . Nsaids   . Sulfa Antibiotics Other (See Comments)    Had a reaction as a child    Social History   Social History  . Marital status: Married    Spouse name: N/A  . Number of children: N/A  . Years of education: 74   Occupational History  . 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 Smoking status: Former Smoker  Types: Pipe    Quit date: 11/18/1995  . Smokeless tobacco: Never Used     Comment: stopped 20 years ago.  1996  . Alcohol use 0.0 oz/week     Comment: once a week   . Drug use: No  . 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 Not on file   Social History Narrative  . No narrative on file    Family History  Problem Relation Age of Onset  . Alcohol abuse Father   . Hyperlipidemia Father   . Hypertension Father   . Diabetes Father   . Heart disease Father   . Cancer Maternal Aunt        lung cancer  . Cancer Paternal Uncle        bone cancer  . Heart attack Mother   . Stroke Mother        swelling in brain stem     BP (!) 145/90   Pulse 71   Ht 6\' 3"  (1.905 m)   Wt 240 lb (108.9 kg)   BMI 30.00 kg/m   Review of Systems: See HPI above.     Objective:  Physical Exam:  Gen: NAD, comfortable in exam room  Back/right hip: No gross deformity, scoliosis. Mild TTP over piriformis.  No other TTP currently including midline, SI joint, trochanter.   FROM. Strength LEs 5/5 all muscle groups including hip abduction.   2+ MSRs in patellar and achilles tendons, equal bilaterally. Negative SLRs. Sensation intact to light touch bilaterally. Negative logroll bilateral hips Pain with piriformis stretch on right, negative left.  Negative fabers   Assessment & Plan:  1. Right hip pain - consistent with piriformis syndrome.  Shown home exercises and stretches to do daily.  Tennis ball massage.  Consider physical therapy, prednisone dose pack - declined both for now.  He inquired about injection but we discussed these don't work for his current condition.  F/u in 6 weeks for reevaluation otherwise.

## 2016-12-12 NOTE — Patient Instructions (Signed)
You have piriformis syndrome. Try to avoid painful activities when possible. Pick 2-3 stretches where you feel the pull in the area of pain - do 3 of these and hold for 20-30 seconds twice a day. Standing hip rotations and hip side raises 3 sets of 10 once a day. Add ankle weight if these become too easy. Tennis ball to massage area when sitting if sore. Consider physical therapy, prednisone dose pack if not improving. Follow up with me in 6 weeks for reevaluation.

## 2016-12-12 NOTE — Assessment & Plan Note (Signed)
consistent with piriformis syndrome.  Shown home exercises and stretches to do daily.  Tennis ball massage.  Consider physical therapy, prednisone dose pack - declined both for now.  He inquired about injection but we discussed these don't work for his current condition.  F/u in 6 weeks for reevaluation otherwise.

## 2016-12-17 ENCOUNTER — Other Ambulatory Visit (INDEPENDENT_AMBULATORY_CARE_PROVIDER_SITE_OTHER): Payer: Medicare Other

## 2016-12-17 DIAGNOSIS — E785 Hyperlipidemia, unspecified: Secondary | ICD-10-CM | POA: Diagnosis not present

## 2016-12-17 DIAGNOSIS — R739 Hyperglycemia, unspecified: Secondary | ICD-10-CM

## 2016-12-17 DIAGNOSIS — Z0000 Encounter for general adult medical examination without abnormal findings: Secondary | ICD-10-CM | POA: Diagnosis not present

## 2016-12-17 DIAGNOSIS — I10 Essential (primary) hypertension: Secondary | ICD-10-CM | POA: Diagnosis not present

## 2016-12-17 LAB — LIPID PANEL
CHOLESTEROL: 117 mg/dL (ref 0–200)
HDL: 52.2 mg/dL (ref >39.00)
LDL Cholesterol: 28 mg/dL (ref 0–99)
NonHDL: 64.82
TRIGLYCERIDES: 186 mg/dL — AB (ref 0.0–149.0)
Total CHOL/HDL Ratio: 2
VLDL: 37.2 mg/dL (ref 0.0–40.0)

## 2016-12-17 LAB — COMPREHENSIVE METABOLIC PANEL
ALBUMIN: 4.2 g/dL (ref 3.5–5.2)
ALK PHOS: 76 U/L (ref 39–117)
ALT: 17 U/L (ref 0–53)
AST: 19 U/L (ref 0–37)
BUN: 14 mg/dL (ref 6–23)
CALCIUM: 9.6 mg/dL (ref 8.4–10.5)
CHLORIDE: 104 meq/L (ref 96–112)
CO2: 28 mEq/L (ref 19–32)
Creatinine, Ser: 1.49 mg/dL (ref 0.40–1.50)
GFR: 49.05 mL/min — AB (ref >60.00)
Glucose, Bld: 101 mg/dL — ABNORMAL HIGH (ref 70–99)
POTASSIUM: 4.2 meq/L (ref 3.5–5.1)
Sodium: 139 mEq/L (ref 135–145)
TOTAL PROTEIN: 6.8 g/dL (ref 6.0–8.3)
Total Bilirubin: 0.5 mg/dL (ref 0.2–1.2)

## 2016-12-17 LAB — CBC
HEMATOCRIT: 44.9 % (ref 39.0–52.0)
HEMOGLOBIN: 15 g/dL (ref 13.0–17.0)
MCHC: 33.5 g/dL (ref 30.0–36.0)
MCV: 93.4 fl (ref 78.0–100.0)
PLATELETS: 161 10*3/uL (ref 150.0–400.0)
RBC: 4.8 Mil/uL (ref 4.22–5.81)
RDW: 14 % (ref 11.5–15.5)
WBC: 6.5 10*3/uL (ref 4.0–10.5)

## 2016-12-17 LAB — HEMOGLOBIN A1C: Hgb A1c MFr Bld: 6 % (ref 4.6–6.5)

## 2016-12-17 LAB — TSH: TSH: 3.07 u[IU]/mL (ref 0.35–4.50)

## 2016-12-24 ENCOUNTER — Encounter: Payer: Self-pay | Admitting: Family Medicine

## 2016-12-24 ENCOUNTER — Ambulatory Visit (INDEPENDENT_AMBULATORY_CARE_PROVIDER_SITE_OTHER): Payer: Medicare Other | Admitting: Family Medicine

## 2016-12-24 VITALS — BP 134/72 | HR 63 | Temp 98.3°F | Resp 18 | Wt 245.2 lb

## 2016-12-24 DIAGNOSIS — F411 Generalized anxiety disorder: Secondary | ICD-10-CM | POA: Diagnosis not present

## 2016-12-24 DIAGNOSIS — G20 Parkinson's disease: Secondary | ICD-10-CM | POA: Diagnosis not present

## 2016-12-24 DIAGNOSIS — I10 Essential (primary) hypertension: Secondary | ICD-10-CM | POA: Diagnosis not present

## 2016-12-24 DIAGNOSIS — R739 Hyperglycemia, unspecified: Secondary | ICD-10-CM | POA: Diagnosis not present

## 2016-12-24 DIAGNOSIS — E785 Hyperlipidemia, unspecified: Secondary | ICD-10-CM | POA: Diagnosis not present

## 2016-12-24 DIAGNOSIS — Z85528 Personal history of other malignant neoplasm of kidney: Secondary | ICD-10-CM

## 2016-12-24 DIAGNOSIS — M25551 Pain in right hip: Secondary | ICD-10-CM | POA: Diagnosis not present

## 2016-12-24 MED ORDER — AMLODIPINE BESYLATE 5 MG PO TABS: 5.0000 mg | ORAL_TABLET | Freq: Every day | ORAL | 5 refills | Status: DC

## 2016-12-24 NOTE — Assessment & Plan Note (Signed)
Following with neurology 

## 2016-12-24 NOTE — Assessment & Plan Note (Addendum)
Is noting his pressure drops at home especially when he overheats with work and he feels weak and light headed and once he cools down and hydrates he feels better. Encouraged daily adequate hdyration, small proteins filled meals throughout the day and report if worsens.

## 2016-12-24 NOTE — Patient Instructions (Addendum)
Hydrate well, 64 oz of clear fluids daily, consider a small amount of gatorade daily as needed Shingrix is the new shingles shot, 2 shots over 6 months can get at pharmacy Need Prevnar the new pneumonia  Shot can get at pharmacy Hypertension Hypertension, commonly called high blood pressure, is when the force of blood pumping through the arteries is too strong. The arteries are the blood vessels that carry blood from the heart throughout the body. Hypertension forces the heart to work harder to pump blood and may cause arteries to become narrow or stiff. Having untreated or uncontrolled hypertension can cause heart attacks, strokes, kidney disease, and other problems. A blood pressure reading consists of a higher number over a lower number. Ideally, your blood pressure should be below 120/80. The first ("top") number is called the systolic pressure. It is a measure of the pressure in your arteries as your heart beats. The second ("bottom") number is called the diastolic pressure. It is a measure of the pressure in your arteries as the heart relaxes. What are the causes? The cause of this condition is not known. What increases the risk? Some risk factors for high blood pressure are under your control. Others are not. Factors you can change  Smoking.  Having type 2 diabetes mellitus, high cholesterol, or both.  Not getting enough exercise or physical activity.  Being overweight.  Having too much fat, sugar, calories, or salt (sodium) in your diet.  Drinking too much alcohol. Factors that are difficult or impossible to change  Having chronic kidney disease.  Having a family history of high blood pressure.  Age. Risk increases with age.  Race. You may be at higher risk if you are African-American.  Gender. Men are at higher risk than women before age 48. After age 77, women are at higher risk than men.  Having obstructive sleep apnea.  Stress. What are the signs or  symptoms? Extremely high blood pressure (hypertensive crisis) may cause:  Headache.  Anxiety.  Shortness of breath.  Nosebleed.  Nausea and vomiting.  Severe chest pain.  Jerky movements you cannot control (seizures).  How is this diagnosed? This condition is diagnosed by measuring your blood pressure while you are seated, with your arm resting on a surface. The cuff of the blood pressure monitor will be placed directly against the skin of your upper arm at the level of your heart. It should be measured at least twice using the same arm. Certain conditions can cause a difference in blood pressure between your right and left arms. Certain factors can cause blood pressure readings to be lower or higher than normal (elevated) for a short period of time:  When your blood pressure is higher when you are in a health care provider's office than when you are at home, this is called white coat hypertension. Most people with this condition do not need medicines.  When your blood pressure is higher at home than when you are in a health care provider's office, this is called masked hypertension. Most people with this condition may need medicines to control blood pressure.  If you have a high blood pressure reading during one visit or you have normal blood pressure with other risk factors:  You may be asked to return on a different day to have your blood pressure checked again.  You may be asked to monitor your blood pressure at home for 1 week or longer.  If you are diagnosed with hypertension, you may have other  blood or imaging tests to help your health care provider understand your overall risk for other conditions. How is this treated? This condition is treated by making healthy lifestyle changes, such as eating healthy foods, exercising more, and reducing your alcohol intake. Your health care provider may prescribe medicine if lifestyle changes are not enough to get your blood pressure  under control, and if:  Your systolic blood pressure is above 130.  Your diastolic blood pressure is above 80.  Your personal target blood pressure may vary depending on your medical conditions, your age, and other factors. Follow these instructions at home: Eating and drinking  Eat a diet that is high in fiber and potassium, and low in sodium, added sugar, and fat. An example eating plan is called the DASH (Dietary Approaches to Stop Hypertension) diet. To eat this way: ? Eat plenty of fresh fruits and vegetables. Try to fill half of your plate at each meal with fruits and vegetables. ? Eat whole grains, such as whole wheat pasta, brown rice, or whole grain bread. Fill about one quarter of your plate with whole grains. ? Eat or drink low-fat dairy products, such as skim milk or low-fat yogurt. ? Avoid fatty cuts of meat, processed or cured meats, and poultry with skin. Fill about one quarter of your plate with lean proteins, such as fish, chicken without skin, beans, eggs, and tofu. ? Avoid premade and processed foods. These tend to be higher in sodium, added sugar, and fat.  Reduce your daily sodium intake. Most people with hypertension should eat less than 1,500 mg of sodium a day.  Limit alcohol intake to no more than 1 drink a day for nonpregnant women and 2 drinks a day for men. One drink equals 12 oz of beer, 5 oz of wine, or 1 oz of hard liquor. Lifestyle  Work with your health care provider to maintain a healthy body weight or to lose weight. Ask what an ideal weight is for you.  Get at least 30 minutes of exercise that causes your heart to beat faster (aerobic exercise) most days of the week. Activities may include walking, swimming, or biking.  Include exercise to strengthen your muscles (resistance exercise), such as pilates or lifting weights, as part of your weekly exercise routine. Try to do these types of exercises for 30 minutes at least 3 days a week.  Do not use any  products that contain nicotine or tobacco, such as cigarettes and e-cigarettes. If you need help quitting, ask your health care provider.  Monitor your blood pressure at home as told by your health care provider.  Keep all follow-up visits as told by your health care provider. This is important. Medicines  Take over-the-counter and prescription medicines only as told by your health care provider. Follow directions carefully. Blood pressure medicines must be taken as prescribed.  Do not skip doses of blood pressure medicine. Doing this puts you at risk for problems and can make the medicine less effective.  Ask your health care provider about side effects or reactions to medicines that you should watch for. Contact a health care provider if:  You think you are having a reaction to a medicine you are taking.  You have headaches that keep coming back (recurring).  You feel dizzy.  You have swelling in your ankles.  You have trouble with your vision. Get help right away if:  You develop a severe headache or confusion.  You have unusual weakness or numbness.  You feel faint.  You have severe pain in your chest or abdomen.  You vomit repeatedly.  You have trouble breathing. Summary  Hypertension is when the force of blood pumping through your arteries is too strong. If this condition is not controlled, it may put you at risk for serious complications.  Your personal target blood pressure may vary depending on your medical conditions, your age, and other factors. For most people, a normal blood pressure is less than 120/80.  Hypertension is treated with lifestyle changes, medicines, or a combination of both. Lifestyle changes include weight loss, eating a healthy, low-sodium diet, exercising more, and limiting alcohol. This information is not intended to replace advice given to you by your health care provider. Make sure you discuss any questions you have with your health care  provider. Document Released: 03/19/2005 Document Revised: 02/15/2016 Document Reviewed: 02/15/2016 Elsevier Interactive Patient Education  [employer].

## 2016-12-24 NOTE — Assessment & Plan Note (Signed)
Is following with sports med upstairs and declined PT but is doing daily exercises and it may be helping

## 2016-12-24 NOTE — Assessment & Plan Note (Addendum)
Encouraged heart healthy diet, increase exercise, avoid trans fats, consider a krill oil cap daily. Atorvastatin is tolerated

## 2016-12-24 NOTE — Assessment & Plan Note (Signed)
hgba1c acceptable, minimize simple carbs. Increase exercise as tolerated.  

## 2016-12-24 NOTE — Assessment & Plan Note (Signed)
Stable on the Lexapro

## 2016-12-24 NOTE — Assessment & Plan Note (Signed)
Is following with [facility] and doing well.

## 2016-12-24 NOTE — Progress Notes (Signed)
Subjective:  I acted as a Education administrator for Dr. Charlett Blake. [name], [place]  Patient ID: Vincent Walker, male    DOB: May 25, 1945, 71 y.o.   MRN: [id number]  No chief complaint on file.   HPI  Patient is in today for 6 month follow up. He feels well today but he has had some episodes of feeling light headed when his blood pressure gets low. He acknowledges he sometimes is working hard in the heat when the pressure drops. No recent febrile illness or hospitalizations. Denies CP/palp/SOB/HA/congestion/fevers/GI or GU c/o. Taking meds as prescribed  Patient Care Team: Mosie Lukes, MD as PCP - General (Family Medicine)   Past Medical History:  Diagnosis Date  . Anxiety state 12/23/2015  . Benign essential HTN 02/08/2015  . Cancer [facility])    Kidney Cancer  . Chronic kidney disease   . Chronic renal insufficiency 02/14/2015  . Constipation 12/23/2015  . Dyslipidemia 02/08/2015  . H/O renal cell carcinoma 02/08/2015  . History of blood transfusion 2010   After Kidney surgery  . History of chicken pox   . Hyperglycemia 12/23/2015  . Hyperlipidemia   . Hypertension   . Ingrown left big toenail 02/14/2015  . Left foot pain 02/14/2015  . Mitral valve prolapse   . MVP (mitral valve prolapse) 05/14/2016  . Parkinson disease ([place]) 12/23/2015  . Preventative health care 06/21/2016  . Thrombocytopenia ([place]) 02/08/2015  . Tremor of right hand 02/14/2015    Past Surgical History:  Procedure Laterality Date  . APPENDECTOMY  1995  . left knee scope  2003  . right knee scope  1999  . TOE SURGERY    . TONSILLECTOMY    . TOTAL KNEE ARTHROPLASTY Left 07/06/2014  . TOTAL NEPHRECTOMY Right     Family History  Problem Relation Age of Onset  . Alcohol abuse Father   . Hyperlipidemia Father   . Hypertension Father   . Diabetes Father   . Heart disease Father   . Cancer Maternal Aunt        lung cancer  . Cancer Paternal Uncle        bone cancer  . Heart attack Mother   . Stroke Mother        swelling in  brain stem    Social History   Social History  . Marital status: Married    Spouse name: N/A  . Number of children: N/A  . Years of education: 71   Occupational History  . 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 Smoking status: Former Smoker    Types: Pipe    Quit date: 11/18/1995  . Smokeless tobacco: Never Used     Comment: stopped 20 years ago.  1996  . Alcohol use 0.0 oz/week     Comment: once a week   . Drug use: No  . 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 Not on file   Social History Narrative  . No narrative on file    Outpatient Medications Prior to Visit  Medication Sig Dispense Refill  . aspirin 81 MG tablet Take 81 mg by mouth daily.    Marland Kitchen atenolol (TENORMIN) 50 MG tablet Take 1 tablet (50 mg total) by mouth daily. 90 tablet 1  . atorvastatin (LIPITOR) 20 MG tablet TAKE 1 TABLET BY MOUTH DAILY 90  tablet 0  . carbidopa-levodopa (SINEMET CR) 50-200 MG tablet TAKE ONE TABLET AT BEDTIME. 30 tablet 5  . carbidopa-levodopa (SINEMET IR) 25-100 MG tablet TAKE 1 TABLET BY MOUTH THREE TIMES DAILY 270 tablet 0  . escitalopram (LEXAPRO) 10 MG tablet TAKE 1 TABLET (10 MG TOTAL) BY MOUTH DAILY. 30 tablet 5  . latanoprost (XALATAN) 0.005 % ophthalmic solution PLACE 1 DROP INTO BOTH EYES NIGHTLY.    . LORazepam (ATIVAN) 1 MG tablet Take 1 mg by mouth daily as needed for anxiety.    . Multiple Vitamins-Minerals (CENTRUM SILVER PO) Take by mouth daily.    Marland Kitchen amLODipine (NORVASC) 10 MG tablet Take 1 tablet (10 mg total) by mouth daily. 30 tablet 5   No facility-administered medications prior to visit.     Allergies  Allergen Reactions  . Nsaids   . Sulfa Antibiotics Other (See Comments)    Had a reaction as a child    Review of Systems  Constitutional: Positive for malaise/fatigue. Negative for fever.  HENT: Negative for  congestion.   Eyes: Negative for blurred vision.  Respiratory: Negative for cough and shortness of breath.   Cardiovascular: Negative for chest pain, palpitations and leg swelling.  Gastrointestinal: Negative for vomiting.  Musculoskeletal: Negative for back pain.  Skin: Negative for rash.  Neurological: Positive for dizziness. Negative for loss of consciousness and headaches.       Objective:    Physical Exam  Constitutional: He is oriented to person, place, and time. He appears well-developed and well-nourished. No distress.  HENT:  Head: Normocephalic and atraumatic.  Eyes: Conjunctivae are normal.  Neck: Normal range of motion. No thyromegaly present.  Cardiovascular: Normal rate and regular rhythm.   Pulmonary/Chest: Effort normal and breath sounds normal. He has no wheezes.  Abdominal: Soft. Bowel sounds are normal. There is no tenderness.  Musculoskeletal: Normal range of motion. He exhibits no edema or deformity.  Neurological: He is alert and oriented to person, place, and time.  Skin: Skin is warm and dry. He is not diaphoretic.  Psychiatric: He has a normal mood and affect.    BP 134/72 (BP Location: Left Arm, Patient Position: Sitting, Cuff Size: Normal)   Pulse 63   Temp 98.3 F (36.8 C) (Oral)   Resp 18   Wt 245 lb 3.2 oz (111.2 kg)   SpO2 98%   BMI 30.65 kg/m  Wt Readings from Last 3 Encounters:  12/24/16 245 lb 3.2 oz (111.2 kg)  12/12/16 240 lb (108.9 kg)  12/11/16 243 lb 12.8 oz (110.6 kg)   BP Readings from Last 3 Encounters:  12/24/16 134/72  12/12/16 (!) 145/90  12/11/16 130/77     Immunization History  Administered Date(s) Administered  . Influenza, High Dose Seasonal PF 12/23/2015, 12/10/2016  . Influenza-Unspecified 02/01/2015    Health Maintenance  Topic Date Due  . Hepatitis C Screening  12-Dec-1945  . TETANUS/TDAP  05/09/1962  . PNA vac Low Risk Adult (1 of 2 - PCV13) 05/09/2008  . COLONOSCOPY  02/07/2021  . INFLUENZA VACCINE   Completed    Lab Results  Component Value Date   WBC 6.5 12/17/2016   HGB 15.0 12/17/2016   HCT 44.9 12/17/2016   PLT 161.0 12/17/2016   GLUCOSE 101 (H) 12/17/2016   CHOL 117 12/17/2016   TRIG 186.0 (H) 12/17/2016   HDL 52.20 12/17/2016   LDLCALC 28 12/17/2016   ALT 17 12/17/2016   AST 19 12/17/2016   NA 139 12/17/2016   K 4.2  12/17/2016   CL 104 12/17/2016   CREATININE 1.49 12/17/2016   BUN 14 12/17/2016   CO2 28 12/17/2016   TSH 3.07 12/17/2016   HGBA1C 6.0 12/17/2016    Lab Results  Component Value Date   TSH 3.07 12/17/2016   Lab Results  Component Value Date   WBC 6.5 12/17/2016   HGB 15.0 12/17/2016   HCT 44.9 12/17/2016   MCV 93.4 12/17/2016   PLT 161.0 12/17/2016   Lab Results  Component Value Date   NA 139 12/17/2016   K 4.2 12/17/2016   CO2 28 12/17/2016   GLUCOSE 101 (H) 12/17/2016   BUN 14 12/17/2016   CREATININE 1.49 12/17/2016   BILITOT 0.5 12/17/2016   ALKPHOS 76 12/17/2016   AST 19 12/17/2016   ALT 17 12/17/2016   PROT 6.8 12/17/2016   ALBUMIN 4.2 12/17/2016   CALCIUM 9.6 12/17/2016   GFR 49.05 (L) 12/17/2016   Lab Results  Component Value Date   CHOL 117 12/17/2016   Lab Results  Component Value Date   HDL 52.20 12/17/2016   Lab Results  Component Value Date   LDLCALC 28 12/17/2016   Lab Results  Component Value Date   TRIG 186.0 (H) 12/17/2016   Lab Results  Component Value Date   CHOLHDL 2 12/17/2016   Lab Results  Component Value Date   HGBA1C 6.0 12/17/2016         Assessment & Plan:   Problem List Items Addressed This Visit    Benign essential HTN    Is noting his pressure drops at home especially when he overheats with work and he feels weak and light headed and once he cools down and hydrates he feels better. Encouraged daily adequate hdyration, small proteins filled meals throughout the day and report if worsens.       Relevant Medications   amLODipine (NORVASC) 5 MG tablet   Other Relevant Orders    Comprehensive metabolic panel   Dyslipidemia    Encouraged heart healthy diet, increase exercise, avoid trans fats, consider a krill oil cap daily. Atorvastatin is tolerated      Relevant Orders   Lipid panel   H/O renal cell carcinoma    Is following with [facility] and doing well.       Parkinson disease [facility])    Following with neurology      Hyperglycemia    hgba1c acceptable, minimize simple carbs. Increase exercise as tolerated.       Relevant Orders   Hemoglobin A1c   Anxiety state    Stable on the Lexapro       Right hip pain    Is following with sports med upstairs and declined PT but is doing daily exercises and it may be helping       Other Visit Diagnoses    Essential hypertension    -  Primary   Relevant Medications   amLODipine (NORVASC) 5 MG tablet   Other Relevant Orders   CBC   TSH      I have discontinued Mr. Reason amLODipine. I am also having him start on amLODipine. Additionally, I am having him maintain his aspirin, Multiple Vitamins-Minerals (CENTRUM SILVER PO), LORazepam, latanoprost, atenolol, carbidopa-levodopa, escitalopram, carbidopa-levodopa, and atorvastatin.  Meds ordered this encounter  Medications  . amLODipine (NORVASC) 5 MG tablet    Sig: Take 1 tablet (5 mg total) by mouth daily.    Dispense:  30 tablet    Refill:  5    CMA  served as Education administrator during this visit. History, Physical and Plan performed by medical provider. Documentation and orders reviewed and attested to.  [name], MD

## 2017-01-14 MED FILL — ESCITALOPRAM 10 MG TABLET: 10 | 30 days supply | Qty: 30 | Fill #5

## 2017-01-21 DIAGNOSIS — Z83511 Family history of glaucoma: Secondary | ICD-10-CM | POA: Diagnosis not present

## 2017-01-21 DIAGNOSIS — H0100A Unspecified blepharitis right eye, upper and lower eyelids: Secondary | ICD-10-CM | POA: Diagnosis not present

## 2017-01-21 DIAGNOSIS — H0100B Unspecified blepharitis left eye, upper and lower eyelids: Secondary | ICD-10-CM | POA: Diagnosis not present

## 2017-01-21 DIAGNOSIS — H524 Presbyopia: Secondary | ICD-10-CM | POA: Diagnosis not present

## 2017-01-21 DIAGNOSIS — H401132 Primary open-angle glaucoma, bilateral, moderate stage: Secondary | ICD-10-CM | POA: Diagnosis not present

## 2017-01-21 DIAGNOSIS — H2513 Age-related nuclear cataract, bilateral: Secondary | ICD-10-CM | POA: Diagnosis not present

## 2017-01-23 ENCOUNTER — Ambulatory Visit (INDEPENDENT_AMBULATORY_CARE_PROVIDER_SITE_OTHER): Payer: Medicare Other | Admitting: Family Medicine

## 2017-01-23 ENCOUNTER — Encounter: Payer: Self-pay | Admitting: Family Medicine

## 2017-01-23 DIAGNOSIS — M25551 Pain in right hip: Secondary | ICD-10-CM

## 2017-01-23 NOTE — Progress Notes (Signed)
PCP: Mosie Lukes, MD Consultation requested by: Mackie Pai PA-C  Subjective:   HPI: Patient is a 71 y.o. male here for right hip pain.  9/12: Patient reports he's had several year history of right hip pain but usually goes away on its own. Past 2 months pain has been more consistent and current issue has lasted over 2 weeks. Pain is a constant soreness but can be sharp and up to 10/10, stop him in his tracks and feel like his leg is going to buckle. No acute injury or trauma. Pain is posterior in buttock but can radiate down to ankle. He cannot take NSAIDs with renal history and doesn't tolerate pain medication well. No skin changes, numbness. No back pain or groin pain.  10/24: Patient reports he's doing well. Feels about 75-80% improved. Pain is 0/10. Doing home exercises. Bothers some with long time working in the yard. Better with rest. No skin changes, numbness.  Past Medical History:  Diagnosis Date  . Anxiety state 12/23/2015  . Benign essential HTN 02/08/2015  . Cancer [facility])    Kidney Cancer  . Chronic kidney disease   . Chronic renal insufficiency 02/14/2015  . Constipation 12/23/2015  . Dyslipidemia 02/08/2015  . H/O renal cell carcinoma 02/08/2015  . History of blood transfusion 2010   After Kidney surgery  . History of chicken pox   . Hyperglycemia 12/23/2015  . Hyperlipidemia   . Hypertension   . Ingrown left big toenail 02/14/2015  . Left foot pain 02/14/2015  . Mitral valve prolapse   . MVP (mitral valve prolapse) 05/14/2016  . Parkinson disease ([place]) 12/23/2015  . Preventative health care 06/21/2016  . Thrombocytopenia ([place]) 02/08/2015  . Tremor of right hand 02/14/2015    Current Outpatient Prescriptions on File Prior to Visit  Medication Sig Dispense Refill  . amLODipine (NORVASC) 5 MG tablet Take 1 tablet (5 mg total) by mouth daily. 30 tablet 5  . aspirin 81 MG tablet Take 81 mg by mouth daily.    Marland Kitchen atorvastatin (LIPITOR) 20 MG tablet TAKE 1  TABLET BY MOUTH DAILY 90 tablet 0  . carbidopa-levodopa (SINEMET CR) 50-200 MG tablet TAKE ONE TABLET AT BEDTIME. 30 tablet 5  . carbidopa-levodopa (SINEMET IR) 25-100 MG tablet TAKE 1 TABLET BY MOUTH THREE TIMES DAILY 270 tablet 0  . escitalopram (LEXAPRO) 10 MG tablet TAKE 1 TABLET (10 MG TOTAL) BY MOUTH DAILY. 30 tablet 5  . latanoprost (XALATAN) 0.005 % ophthalmic solution PLACE 1 DROP INTO BOTH EYES NIGHTLY.    . LORazepam (ATIVAN) 1 MG tablet Take 1 mg by mouth daily as needed for anxiety.    . Multiple Vitamins-Minerals (CENTRUM SILVER PO) Take by mouth daily.     No current facility-administered medications on file prior to visit.     Past Surgical History:  Procedure Laterality Date  . APPENDECTOMY  1995  . left knee scope  2003  . right knee scope  1999  . TOE SURGERY    . TONSILLECTOMY    . TOTAL KNEE ARTHROPLASTY Left 07/06/2014  . TOTAL NEPHRECTOMY Right     Allergies  Allergen Reactions  . Nsaids   . Sulfa Antibiotics Other (See Comments)    Had a reaction as a child  . Tolmetin Other (See Comments)    Social History   Social History  . Marital status: Married    Spouse name: N/A  . Number of children: N/A  . Years of education: 6   Occupational History  .  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 Smoking status: Former Smoker    Types: Pipe    Quit date: 11/18/1995  . Smokeless tobacco: Never Used     Comment: stopped 20 years ago.  1996  . Alcohol use 0.0 oz/week     Comment: once a week   . Drug use: No  . 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 Not on file   Social History Narrative  . No narrative on file    Family History  Problem Relation Age of Onset  . Alcohol abuse Father   . Hyperlipidemia Father   . Hypertension Father   . Diabetes Father   . Heart disease Father   . Cancer Maternal  Aunt        lung cancer  . Cancer Paternal Uncle        bone cancer  . Heart attack Mother   . Stroke Mother        swelling in brain stem    BP 115/76   Pulse 72   Ht 6\' 3"  (1.905 m)   Wt 240 lb (108.9 kg)   BMI 30.00 kg/m   Review of Systems: See HPI above.     Objective:  Physical Exam:  Gen: NAD, comfortable in exam room.  Back/right hip: No gross deformity, scoliosis. No TTP .  No midline or bony TTP. FROM. Strength LEs 5/5 all muscle groups.   2+ MSRs in patellar and achilles tendons, equal bilaterally. Negative SLRs. Sensation intact to light touch bilaterally. Negative logroll bilateral hips Negative fabers and piriformis stretches.   Assessment & Plan:  1. Right hip pain - 2/2 piriformis syndrome.  Improved with home exercise program.  Continue this for another 6 weeks.  Tylenol only if needed.  F/u prn.

## 2017-01-23 NOTE — Assessment & Plan Note (Signed)
2/2 piriformis syndrome.  Improved with home exercise program.  Continue this for another 6 weeks.  Tylenol only if needed.  F/u prn.

## 2017-01-23 NOTE — Patient Instructions (Signed)
Do the home exercises and stretches most days of the week for 6 more weeks then stop these. Call me if you have any problems otherwise follow up as needed.

## 2017-01-28 ENCOUNTER — Ambulatory Visit: Payer: Medicare Other | Admitting: Neurology

## 2017-02-12 ENCOUNTER — Other Ambulatory Visit: Payer: Self-pay | Admitting: Neurology

## 2017-02-12 MED FILL — ESCITALOPRAM 10 MG TABLET: 10 | 90 days supply | Qty: 90 | Fill #0

## 2017-02-13 ENCOUNTER — Other Ambulatory Visit: Payer: Self-pay | Admitting: Neurology

## 2017-02-13 MED ORDER — CARBIDOPA-LEVODOPA ER 50-200 MG PO TBCR
1.0000 | EXTENDED_RELEASE_TABLET | Freq: Every day | ORAL | 0 refills | Status: DC
Start: 2017-02-13 — End: 2017-05-10

## 2017-02-13 MED FILL — CARBIDOPA-LEVO ER 50-200 TA: 50-200 | 90 days supply | Qty: 90 | Fill #0

## 2017-02-17 ENCOUNTER — Other Ambulatory Visit: Payer: Self-pay | Admitting: Neurology

## 2017-02-17 DIAGNOSIS — G20 Parkinson's disease: Secondary | ICD-10-CM

## 2017-03-15 ENCOUNTER — Other Ambulatory Visit: Payer: Self-pay | Admitting: Family Medicine

## 2017-03-15 MED FILL — ATENOLOL 100 MG TAB: 100 | 90 days supply | Qty: 45 | Fill #0

## 2017-03-15 MED FILL — ATORVASTATIN 20 MG TABLET: 20 | 90 days supply | Qty: 90 | Fill #0

## 2017-05-01 NOTE — Progress Notes (Signed)
Vincent Walker was seen today in the movement disorders clinic for neurologic consultation at the request of Mosie Lukes, MD.  The consultation is for the evaluation of R hand tremor and shuffling.  This patient is accompanied in the office by his spouse who supplements the history.  02/17/16 update:  The patient was diagnosed with Parkinson's disease last visit and started on carbidopa/levodopa 25/100, one tablet 3 times per day (7am/11:30am/bedtime).  States that he dosed it this way because he slept better with the bedtime dosing.  The patient had an MRI of the brain on 11/30/2015 with and without gadolinium.  I had the opportunity to review this.  It was normal.  In regards to his restless leg syndrome, the patient states that he is doing well.  In regards to mood, he states that it isn't good and there are mood swings.  They watch the grandkids and the noise and lack of peace will irritate him.  He doesn't have energy and he can't do the things that he used to be able to do.  Pt denies falls.  Pt denies lightheadedness, near syncope.  No hallucinations.  He is on stationary bike at home 3-5 times per week.    05/22/16 update:  Patient follows up today.  He is on carbidopa/levodopa 25/100, one tablet 3 times per day.  Overall, the patient feels he has been stable. Had 4-5 episodes since our last visit of RLS.  Is on CR q hs.   Denies falls.  Denies lightheadedness or near syncope.  No hallucinations.  Mood has been good.  Continues to ride his exercise bike but not as much because of foot pain.  Having hammertoe surgery on 05/24/2016.  Was already given RX for zofran.     09/25/16 update:  Patient today in follow-up.  He is on carbidopa/levodopa 25/100, one tablet 3 times per day (7-8am; 11:30-1pm; 5:30-6:30pm) and last visit I told him to take his carbidopa/levodopa 50/200 about an hour before bedtime because of his restless leg syndrome.  His wife states that as long as he takes it at 8:30-9, his  RLS is well controlled.  He is on lexapro, which can make RLS a little worse, but it is helping mood.  Pt denies falls.  Pt denies lightheadedness, near syncope.  No hallucinations.  Mood has been good.  He is riding his bike 3-4 days per week.  He had hammertoe surgery on the L and he did really well.  05/02/17 update: Patient seen in follow-up.  He is on carbidopa/levodopa 25/100, 1 tablet 3 times per day and carbidopa/levodopa 50/200 1-hour prior to bedtime. Pt did have a fall over the box garden fence outside and didn't get hurt.  On another occasion, he slid on wood and didn't get hurt.  Did fall out of the bed while dreaming - had a vivid dream and fell off of the bed.  Pt denies lightheadedness, near syncope.  No hallucinations.  Mood has been good.  Wife thinks getting a little more forgetful in terms of word finding.  The records that were made available to me were reviewed.  Has been seen by sports med for piriformis syndrome.  Tried CBD oil and that helped.  Is sleepy in day (deny OSAS) but not exercising.   PREVIOUS MEDICATIONS: zoloft (felt more depressed when started taking that - only 25 mg and didn't take for long)  ALLERGIES:   Allergies  Allergen Reactions  . Nsaids   .  Sulfa Antibiotics Other (See Comments)    Had a reaction as a child  . Tolmetin Other (See Comments)    CURRENT MEDICATIONS:  Outpatient Encounter Medications as of 05/02/2017  Medication Sig  . amLODipine (NORVASC) 5 MG tablet Take 1 tablet (5 mg total) by mouth daily.  Marland Kitchen aspirin 81 MG tablet Take 81 mg by mouth daily.  Marland Kitchen atenolol (TENORMIN) 100 MG tablet   . atorvastatin (LIPITOR) 20 MG tablet TAKE 1 TABLET BY MOUTH DAILY  . carbidopa-levodopa (SINEMET CR) 50-200 MG tablet Take 1 tablet at bedtime by mouth.  . carbidopa-levodopa (SINEMET IR) 25-100 MG tablet TAKE 1 TABLET BY MOUTH THREE TIMES DAILY  . escitalopram (LEXAPRO) 10 MG tablet TAKE 1 TABLET (10 MG TOTAL) BY MOUTH DAILY.  Marland Kitchen latanoprost (XALATAN)  0.005 % ophthalmic solution PLACE 1 DROP INTO BOTH EYES NIGHTLY.  . Multiple Vitamins-Minerals (CENTRUM SILVER PO) Take by mouth daily.  . [DISCONTINUED] LORazepam (ATIVAN) 1 MG tablet Take 1 mg by mouth daily as needed for anxiety.  . [DISCONTINUED] atenolol (TENORMIN) 100 MG tablet TAKE 1/2 TABLET BY MOUTH DAILY   No facility-administered encounter medications on file as of 05/02/2017.     PAST MEDICAL HISTORY:   Past Medical History:  Diagnosis Date  . Anxiety state 12/23/2015  . Benign essential HTN 02/08/2015  . Cancer [facility])    Kidney Cancer  . Chronic kidney disease   . Chronic renal insufficiency 02/14/2015  . Constipation 12/23/2015  . Dyslipidemia 02/08/2015  . H/O renal cell carcinoma 02/08/2015  . History of blood transfusion 2010   After Kidney surgery  . History of chicken pox   . Hyperglycemia 12/23/2015  . Hyperlipidemia   . Hypertension   . Ingrown left big toenail 02/14/2015  . Left foot pain 02/14/2015  . Mitral valve prolapse   . MVP (mitral valve prolapse) 05/14/2016  . Parkinson disease ([place]) 12/23/2015  . Preventative health care 06/21/2016  . Thrombocytopenia ([place]) 02/08/2015  . Tremor of right hand 02/14/2015    PAST SURGICAL HISTORY:   Past Surgical History:  Procedure Laterality Date  . APPENDECTOMY  1995  . left knee scope  2003  . right knee scope  1999  . TOE SURGERY    . TONSILLECTOMY    . TOTAL KNEE ARTHROPLASTY Left 07/06/2014  . TOTAL NEPHRECTOMY Right     SOCIAL HISTORY:   Social History   Socioeconomic History  . Marital status: Married    Spouse name: Not on file  . Number of children: Not on file  . Years of education: 1  . Highest education level: Not on file  Social Needs  . Financial resource strain: Not on file  . Food insecurity - worry: Not on file  . Food insecurity - inability: Not on file  . Transportation needs - medical: Not on file  . Transportation needs - non-medical: Not on file  Occupational History  .  Occupation: retired Freight forwarder in [employer]  Tobacco Use  . Smoking status: Former Smoker    Types: Pipe    Last attempt to quit: 11/18/1995    Years since quitting: 21.4  . Smokeless tobacco: Never Used  . Tobacco comment: stopped 20 years ago.  1996  Substance and Sexual Activity  . Alcohol use: Yes    Alcohol/week: 0.0 oz    Comment: once a week   . Drug use: No  . Sexual activity: Yes    Comment: lives with wife, retired from Naval architect  in plant, no major dietary restrictions   Other Topics Concern  . Not on file  Social History Narrative  . Not on file    FAMILY HISTORY:   Family Status  Relation Name Status  . Father 60,cigarettes Deceased  . Mat Aunt  Deceased  . Annamarie Major  Deceased  . Mother 75 Deceased  . Daughter 6 Alive  . Son 45 Alive  . MGM  Deceased at age 33  . MGF  Deceased at age 43  . PGM  Deceased at age 49  . PGF  Deceased at age 44  . Daughter 89 Alive    ROS:  A complete 10 system review of systems was obtained and was unremarkable apart from what is mentioned above.  PHYSICAL EXAMINATION:    VITALS:   Vitals:   05/02/17 1458  BP: 128/82  Pulse: 72  SpO2: 93%  Weight: 250 lb (113.4 kg)  Height: 6\' 3"  (1.905 m)    GEN:  The patient appears stated age and is in NAD. HEENT:  Normocephalic, atraumatic.  The mucous membranes are moist. The superficial temporal arteries are without ropiness or tenderness. CV:  RRR Lungs:  CTAB Neck/HEME:  There are no carotid bruits bilaterally.  Neurological examination:  Orientation: The patient is alert and oriented x3.  Cranial nerves: There is good facial symmetry. There is facial hypomimia. Extraocular muscles are intact. The visual fields are full to confrontational testing. The speech is fluent and clear. Soft palate rises symmetrically and there is no tongue deviation. Hearing is slightly decreased to conversational tone. Sensation: Sensation is intact to light touch  throughout Motor: Strength is 5/5 in the bilateral upper and lower extremities.   Shoulder shrug is equal and symmetric.  There is no pronator drift.  Movement examination: Tone: There is mild increased tone in the R>LUE Abnormal movements: There is a mild RUE resting tremor Coordination:  There is mild trouble with  alternation of supination/pronation of the forearm bilaterally and some trouble with hand opening and closing on the right.   Gait and Station: The patient has no difficulty arising out of a deep-seated chair without the use of the hands. The patient's stride length is normal with decreased arm swing on the right.  He has re-emergent tremor on the right.  The patient has a negative pull test.      LABS    Chemistry      Component Value Date/Time   NA 139 12/17/2016 0740   K 4.2 12/17/2016 0740   CL 104 12/17/2016 0740   CO2 28 12/17/2016 0740   BUN 14 12/17/2016 0740   CREATININE 1.49 12/17/2016 0740      Component Value Date/Time   CALCIUM 9.6 12/17/2016 0740   ALKPHOS 76 12/17/2016 0740   AST 19 12/17/2016 0740   ALT 17 12/17/2016 0740   BILITOT 0.5 12/17/2016 0740     Lab Results  Component Value Date   WBC 6.5 12/17/2016   HGB 15.0 12/17/2016   HCT 44.9 12/17/2016   MCV 93.4 12/17/2016   PLT 161.0 12/17/2016   Lab Results  Component Value Date   VITAMINB12 886 11/18/2015     Lab Results  Component Value Date   IRON 71 11/18/2015    Lab Results  Component Value Date   TSH 3.07 12/17/2016     ASSESSMENT/PLAN:  1.  Idiopathic Parkinson's disease.   This is a new dx on 11/18/15. The patient has tremor, bradykinesia, rigidity and mild  postural instability.  -We discussed the diagnosis as well as pathophysiology of the disease.  We discussed treatment options as well as prognostic indicators.  Patient education was provided.  -increase carbidopa/levodopa 25/100 to 2/1/1.  Risks, benefits, side effects and alternative therapies were discussed.  The  opportunity to ask questions was given and they were answered to the best of my ability.  The patient expressed understanding and willingness to follow the outlined treatment protocols.  -continue carbidopa/levodopa 50/200 1 hour prior to bedtime due to RLS at bedtime  -talked to him about exercise and needs to encorporate that into daily.    2.  RLS  -The levodopa has helped 1 hour prior to bed  3.  Depression and anxiety  -Can be associated with Parkinson's disease.    -continue lexapro - 10 mg q day.  Could be making RLS worse but benefits outweigh risks  4.  RBD  -He fell out of the bed since our last visit acting out a dream.  I talked to him extensively about meds and safety associated with this.  He doesn't want this.  He is to move bedside stand away from the bed.    5.  Follow up is anticipated in the next few months, sooner should new neurologic issues arise.  Much greater than 50% of this visit was spent in counseling and coordinating care.  Total face to face time:  25 min

## 2017-05-02 ENCOUNTER — Encounter: Payer: Self-pay | Admitting: Neurology

## 2017-05-02 ENCOUNTER — Ambulatory Visit (INDEPENDENT_AMBULATORY_CARE_PROVIDER_SITE_OTHER): Payer: Medicare Other | Admitting: Neurology

## 2017-05-02 VITALS — BP 128/82 | HR 72 | Ht 75.0 in | Wt 250.0 lb

## 2017-05-02 DIAGNOSIS — G20 Parkinson's disease: Secondary | ICD-10-CM | POA: Diagnosis not present

## 2017-05-02 DIAGNOSIS — G4752 REM sleep behavior disorder: Secondary | ICD-10-CM

## 2017-05-02 DIAGNOSIS — G2581 Restless legs syndrome: Secondary | ICD-10-CM

## 2017-05-02 MED ORDER — CARBIDOPA-LEVODOPA 25-100 MG PO TABS
ORAL_TABLET | ORAL | 1 refills | Status: DC
Start: 2017-05-02 — End: 2018-02-06

## 2017-05-02 NOTE — Patient Instructions (Signed)
Increase carbidopa/levodopa 25/100 to 2 tablets at 7am/1 at 11am/1 at 3-4 pm Continue carbidopa/levodopa 50/200 one hour before bedtime

## 2017-05-10 ENCOUNTER — Other Ambulatory Visit: Payer: Self-pay | Admitting: Neurology

## 2017-05-10 MED FILL — ESCITALOPRAM 10 MG TABLET: 10 | 90 days supply | Qty: 90 | Fill #0

## 2017-05-10 MED FILL — CARBIDOPA-LEVO ER 50-200 TA: 50-200 | 90 days supply | Qty: 90 | Fill #0

## 2017-05-13 MED FILL — CARBIDOPA-LEVODOPA 25-100 T: 25-100 | 90 days supply | Qty: 360 | Fill #0

## 2017-05-13 MED FILL — AMLODIPINE BESYLATE 5 MG TA: 5 | 30 days supply | Qty: 30 | Fill #0

## 2017-05-24 DIAGNOSIS — H0100B Unspecified blepharitis left eye, upper and lower eyelids: Secondary | ICD-10-CM | POA: Diagnosis not present

## 2017-05-24 DIAGNOSIS — H0100A Unspecified blepharitis right eye, upper and lower eyelids: Secondary | ICD-10-CM | POA: Diagnosis not present

## 2017-05-24 DIAGNOSIS — Z83511 Family history of glaucoma: Secondary | ICD-10-CM | POA: Diagnosis not present

## 2017-05-24 DIAGNOSIS — H401132 Primary open-angle glaucoma, bilateral, moderate stage: Secondary | ICD-10-CM | POA: Diagnosis not present

## 2017-06-04 NOTE — Progress Notes (Signed)
Subjective:   Vincent Walker is a 72 y.o. male who presents for an Initial Medicare Annual Wellness Visit. The Patient was informed that the wellness visit is to identify future health risk and educate and initiate measures that can reduce risk for increased disease through the lifespan.   Review of Systems No ROS.  Medicare Wellness Visit. Additional risk factors are reflected in the social history. Cardiac Risk Factors include: advanced age (>75men, >39 women);dyslipidemia;hypertension;male gender Sleep patterns: wakes once to urinate. Sleeps 7-9 hrs. Feels rested. Naps daily 30-60 min. Home Safety/Smoke Alarms: Feels safe in home. Smoke alarms in place.  Living environment; residence and Firearm Safety: 1 story home. Lives with wife and 2 large dogs. Seat Belt Safety/Bike Helmet: Wears seat belt.  Male:   CCS-  Last reported 02/08/11 w/ 10 yr recall PSA- No results found for: PSA Eye-Dr.Evans every 4 months. Dentist- only as needed.   Objective:    Today's Vitals   06/07/17 0839  BP: 126/64  Pulse: 62  SpO2: 96%  Weight: 247 lb 12.8 oz (112.4 kg)  Height: 6\' 3"  (1.905 m)   Body mass index is 30.97 kg/m.  Advanced Directives 06/07/2017 09/08/2015  Does Patient Have a Medical Advance Directive? Yes Yes  Type of Paramedic of [place];Living will [place];Living will  Does patient want to make changes to medical advance directive? No - Patient declined -  Copy of [place] in Chart? No - copy requested -    Current Medications (verified) Outpatient Encounter Medications as of 06/07/2017  Medication Sig  . amLODipine (NORVASC) 5 MG tablet Take 1 tablet (5 mg total) by mouth daily.  Marland Kitchen aspirin 81 MG tablet Take 81 mg by mouth daily.  Marland Kitchen atenolol (TENORMIN) 100 MG tablet   . atorvastatin (LIPITOR) 20 MG tablet TAKE 1 TABLET BY MOUTH DAILY  . carbidopa-levodopa (SINEMET CR) 50-200 MG tablet TAKE 1 TABLET AT BEDTIME BY  MOUTH.  . carbidopa-levodopa (SINEMET IR) 25-100 MG tablet 2 at 7am/1 at 11am/1 at 4pm  . escitalopram (LEXAPRO) 10 MG tablet TAKE 1 TABLET (10 MG TOTAL) BY MOUTH DAILY.  Marland Kitchen latanoprost (XALATAN) 0.005 % ophthalmic solution PLACE 1 DROP INTO BOTH EYES NIGHTLY.  . Multiple Vitamins-Minerals (CENTRUM SILVER PO) Take by mouth daily.   No facility-administered encounter medications on file as of 06/07/2017.     Allergies (verified) Nsaids; Sulfa antibiotics; and Tolmetin   History: Past Medical History:  Diagnosis Date  . Anxiety state 12/23/2015  . Benign essential HTN 02/08/2015  . Cancer [facility])    Kidney Cancer  . Chronic kidney disease   . Chronic renal insufficiency 02/14/2015  . Constipation 12/23/2015  . Dyslipidemia 02/08/2015  . H/O renal cell carcinoma 02/08/2015  . History of blood transfusion 2010   After Kidney surgery  . History of chicken pox   . Hyperglycemia 12/23/2015  . Hyperlipidemia   . Hypertension   . Ingrown left big toenail 02/14/2015  . Left foot pain 02/14/2015  . Mitral valve prolapse   . MVP (mitral valve prolapse) 05/14/2016  . Parkinson disease ([place]) 12/23/2015  . Preventative health care 06/21/2016  . Thrombocytopenia ([place]) 02/08/2015  . Tremor of right hand 02/14/2015   Past Surgical History:  Procedure Laterality Date  . APPENDECTOMY  1995  . left knee scope  2003  . right knee scope  1999  . TOE SURGERY    . TONSILLECTOMY    . TOTAL KNEE ARTHROPLASTY  Left 07/06/2014  . TOTAL NEPHRECTOMY Right    Family History  Problem Relation Age of Onset  . Alcohol abuse Father   . Hyperlipidemia Father   . Hypertension Father   . Diabetes Father   . Heart disease Father   . Cancer Maternal Aunt        lung cancer  . Cancer Paternal Uncle        bone cancer  . Heart attack Mother   . Stroke Mother        swelling in brain stem   Social History   Socioeconomic History  . Marital status: Married    Spouse name: None  . Number of children: None  .  Years of education: 35  . Highest education level: None  Social Needs  . Financial resource strain: None  . Food insecurity - worry: None  . Food insecurity - inability: None  . Transportation needs - medical: None  . Transportation needs - non-medical: None  Occupational History  . Occupation: retired Freight forwarder in [employer]  Tobacco Use  . Smoking status: Former Smoker    Types: Pipe    Last attempt to quit: 11/18/1995    Years since quitting: 21.5  . Smokeless tobacco: Never Used  . Tobacco comment: stopped 20 years ago.  1996  Substance and Sexual Activity  . Alcohol use: Yes    Alcohol/week: 0.0 oz    Comment: once a week   . Drug use: No  . Sexual activity: Yes    Comment: lives with wife, retired from Naval architect in plant, no major dietary restrictions   Other Topics Concern  . None  Social History Narrative  . None   Tobacco Counseling Counseling given: Not Answered Comment: stopped 20 years ago.  1996   Clinical Intake: Pain : No/denies pain  Activities of Daily Living In your present state of health, do you have any difficulty performing the following activities: 06/07/2017  Hearing? Y  Comment Fails whisper test.   Vision? N  Difficulty concentrating or making decisions? Y  Comment States short term has been affected by Parkinson's.  Walking or climbing stairs? N  Dressing or bathing? N  Doing errands, shopping? N  Preparing Food and eating ? N  Using the Toilet? N  In the past six months, have you accidently leaked urine? N  Do you have problems with loss of bowel control? N  Managing your Medications? N  Managing your Finances? N  Housekeeping or managing your Housekeeping? N  Some recent data might be hidden     Immunizations and Health Maintenance Immunization History  Administered Date(s) Administered  . Influenza, High Dose Seasonal PF 12/23/2015, 12/10/2016  . Influenza-Unspecified 02/01/2015   Health Maintenance Due    Topic Date Due  . Hepatitis C Screening  Nov 29, 1945  . TETANUS/TDAP  05/09/1962  . PNA vac Low Risk Adult (2 of 2 - PPSV23) 04/02/2012    Patient Care Team: Mosie Lukes, MD as PCP - General (Family Medicine) Tat, Eustace Quail, DO as Consulting Physician (Neurology)  Indicate any recent Medical Services you may have received from other than [facility] providers in the past year (date may be approximate).    Assessment:   This is a routine wellness examination for [employer]. Physical assessment deferred to PCP.  Hearing/Vision screen  Visual Acuity Screening   Right eye Left eye Both eyes  Without correction:     With correction: 20/32 20/40 20/20   Hearing Screening Comments:  Able to hear conversational tones w/o difficulty. No issues reported.  Failed whisper test.   Dietary issues and exercise activities discussed: Current Exercise Habits: Home exercise routine, Type of exercise: treadmill;stretching, Frequency (Times/Week): 4, Intensity: Moderate Diet (meal preparation, eat out, water intake, caffeinated beverages, dairy products, fruits and vegetables):  24 hour recall Breakfast: coffee, prune juice Lunch: salad and 6 chicken wings and sweet tea with lemon Dinner: steak, potato cakes, asparagus, salad, cocktail, water    Goals    . Patient Stated     Remain physically active       Depression Screen PHQ 2/9 Scores 06/07/2017 12/23/2015 08/31/2015  PHQ - 2 Score 0 0 0    Fall Risk Fall Risk  06/07/2017 05/02/2017 05/22/2016 02/20/2016 12/23/2015  Falls in the past year? Yes Yes No No No  Number falls in past yr: 1 1 - - -  Injury with Fall? - No - - -  Follow up Education provided;Falls prevention discussed Falls evaluation completed - - -   Cognitive Function: MMSE - Mini Mental State Exam 06/07/2017  Orientation to time 5  Orientation to Place 5  Registration 3  Attention/ Calculation 5  Recall 2  Language- name 2 objects 2  Language- repeat 1  Language- follow 3 step  command 3  Language- read & follow direction 1  Write a sentence 1  Copy design 1  Total score 29        Screening Tests Health Maintenance  Topic Date Due  . Hepatitis C Screening  Dec 01, 1945  . TETANUS/TDAP  05/09/1962  . PNA vac Low Risk Adult (2 of 2 - PPSV23) 04/02/2012  . COLONOSCOPY  02/07/2021  . INFLUENZA VACCINE  Completed      Plan:   Follow up with Dr.Blyth today as scheduled  Continue to eat heart healthy diet (full of fruits, vegetables, whole grains, lean protein, water--limit salt, fat, and sugar intake) and increase physical activity as tolerated.  Continue doing brain stimulating activities (puzzles, reading, adult coloring books, staying active) to keep memory sharp.   Bring a copy of your living will and/or healthcare power of attorney to your next office visit.  I will fax the record release form to Dr.Ha's office to find out about your Pneumonia Vaccine history.  I have personally reviewed and noted the following in the patient's chart:   . Medical and social history . Use of alcohol, tobacco or illicit drugs  . Current medications and supplements . Functional ability and status . Nutritional status . Physical activity . Advanced directives . List of other physicians . Hospitalizations, surgeries, and ER visits in previous 12 months . Vitals . Screenings to include cognitive, depression, and falls . Referrals and appointments  In addition, I have reviewed and discussed with patient certain preventive protocols, quality metrics, and best practice recommendations. A written personalized care plan for preventive services as well as general preventive health recommendations were provided to patient.     Shela Nevin, [place]   06/07/2017

## 2017-06-05 ENCOUNTER — Other Ambulatory Visit (INDEPENDENT_AMBULATORY_CARE_PROVIDER_SITE_OTHER): Payer: Medicare Other

## 2017-06-05 DIAGNOSIS — E785 Hyperlipidemia, unspecified: Secondary | ICD-10-CM | POA: Diagnosis not present

## 2017-06-05 DIAGNOSIS — I10 Essential (primary) hypertension: Secondary | ICD-10-CM | POA: Diagnosis not present

## 2017-06-05 DIAGNOSIS — R739 Hyperglycemia, unspecified: Secondary | ICD-10-CM | POA: Diagnosis not present

## 2017-06-05 LAB — COMPREHENSIVE METABOLIC PANEL
ALBUMIN: 4.3 g/dL (ref 3.5–5.2)
ALK PHOS: 72 U/L (ref 39–117)
ALT: 7 U/L (ref 0–53)
AST: 20 U/L (ref 0–37)
BUN: 22 mg/dL (ref 6–23)
CO2: 26 mEq/L (ref 19–32)
CREATININE: 1.55 mg/dL — AB (ref 0.40–1.50)
Calcium: 9.7 mg/dL (ref 8.4–10.5)
Chloride: 104 mEq/L (ref 96–112)
GFR: 46.81 mL/min — ABNORMAL LOW (ref >60.00)
Glucose, Bld: 103 mg/dL — ABNORMAL HIGH (ref 70–99)
POTASSIUM: 4.2 meq/L (ref 3.5–5.1)
Sodium: 139 mEq/L (ref 135–145)
TOTAL PROTEIN: 6.9 g/dL (ref 6.0–8.3)
Total Bilirubin: 0.8 mg/dL (ref 0.2–1.2)

## 2017-06-05 LAB — CBC
HCT: 44.9 % (ref 39.0–52.0)
HEMOGLOBIN: 15.2 g/dL (ref 13.0–17.0)
MCHC: 33.9 g/dL (ref 30.0–36.0)
MCV: 91.8 fl (ref 78.0–100.0)
Platelets: 165 10*3/uL (ref 150.0–400.0)
RBC: 4.9 Mil/uL (ref 4.22–5.81)
RDW: 13.8 % (ref 11.5–15.5)
WBC: 8 10*3/uL (ref 4.0–10.5)

## 2017-06-05 LAB — LIPID PANEL
Cholesterol: 112 mg/dL (ref 0–200)
HDL: 42.4 mg/dL (ref >39.00)
LDL Cholesterol: 36 mg/dL (ref 0–99)
NonHDL: 69.96
Total CHOL/HDL Ratio: 3
Triglycerides: 170 mg/dL — ABNORMAL HIGH (ref 0.0–149.0)
VLDL: 34 mg/dL (ref 0.0–40.0)

## 2017-06-05 LAB — TSH: TSH: 3.44 u[IU]/mL (ref 0.35–4.50)

## 2017-06-05 LAB — HEMOGLOBIN A1C: HEMOGLOBIN A1C: 6.2 % (ref 4.6–6.5)

## 2017-06-07 ENCOUNTER — Ambulatory Visit (INDEPENDENT_AMBULATORY_CARE_PROVIDER_SITE_OTHER): Payer: Medicare Other | Admitting: Family Medicine

## 2017-06-07 ENCOUNTER — Ambulatory Visit (INDEPENDENT_AMBULATORY_CARE_PROVIDER_SITE_OTHER): Payer: Medicare Other | Admitting: *Deleted

## 2017-06-07 ENCOUNTER — Encounter: Payer: Self-pay | Admitting: *Deleted

## 2017-06-07 VITALS — BP 126/64 | HR 62 | Ht 75.0 in | Wt 247.8 lb

## 2017-06-07 DIAGNOSIS — N189 Chronic kidney disease, unspecified: Secondary | ICD-10-CM

## 2017-06-07 DIAGNOSIS — R7989 Other specified abnormal findings of blood chemistry: Secondary | ICD-10-CM | POA: Insufficient documentation

## 2017-06-07 DIAGNOSIS — Z0000 Encounter for general adult medical examination without abnormal findings: Secondary | ICD-10-CM | POA: Diagnosis not present

## 2017-06-07 DIAGNOSIS — I10 Essential (primary) hypertension: Secondary | ICD-10-CM

## 2017-06-07 DIAGNOSIS — D696 Thrombocytopenia, unspecified: Secondary | ICD-10-CM

## 2017-06-07 DIAGNOSIS — R739 Hyperglycemia, unspecified: Secondary | ICD-10-CM | POA: Diagnosis not present

## 2017-06-07 DIAGNOSIS — E785 Hyperlipidemia, unspecified: Secondary | ICD-10-CM

## 2017-06-07 HISTORY — DX: Other specified abnormal findings of blood chemistry: R79.89

## 2017-06-07 NOTE — Assessment & Plan Note (Signed)
Still WNL but rising from previous readings. Will add free T4 and free T3

## 2017-06-07 NOTE — Patient Instructions (Signed)
Preventive Care 78 Years and Older, Male Preventive care refers to lifestyle choices and visits with your health care provider that can promote health and wellness. What does preventive care include?  A yearly physical exam. This is also called an annual well check.  Dental exams once or twice a year.  Routine eye exams. Ask your health care provider how often you should have your eyes checked.  Personal lifestyle choices, including: ? Daily care of your teeth and gums. ? Regular physical activity. ? Eating a healthy diet. ? Avoiding tobacco and drug use. ? Limiting alcohol use. ? Practicing safe sex. ? Taking low doses of aspirin every day. ? Taking vitamin and mineral supplements as recommended by your health care provider. What happens during an annual well check? The services and screenings done by your health care provider during your annual well check will depend on your age, overall health, lifestyle risk factors, and family history of disease. Counseling Your health care provider may ask you questions about your:  Alcohol use.  Tobacco use.  Drug use.  Emotional well-being.  Home and relationship well-being.  Sexual activity.  Eating habits.  History of falls.  Memory and ability to understand (cognition).  Work and work Statistician.  Screening You may have the following tests or measurements:  Height, weight, and BMI.  Blood pressure.  Lipid and cholesterol levels. These may be checked every 5 years, or more frequently if you are over 7 years old.  Skin check.  Lung cancer screening. You may have this screening every year starting at age 67 if you have a 30-pack-year history of smoking and currently smoke or have quit within the past 15 years.  Fecal occult blood test (FOBT) of the stool. You may have this test every year starting at age 62.  Flexible sigmoidoscopy or colonoscopy. You may have a sigmoidoscopy every 5 years or a colonoscopy every 10  years starting at age 21.  Prostate cancer screening. Recommendations will vary depending on your family history and other risks.  Hepatitis C blood test.  Hepatitis B blood test.  Sexually transmitted disease (STD) testing.  Diabetes screening. This is done by checking your blood sugar (glucose) after you have not eaten for a while (fasting). You may have this done every 1-3 years.  Abdominal aortic aneurysm (AAA) screening. You may need this if you are a current or former smoker.  Osteoporosis. You may be screened starting at age 23 if you are at high risk.  Talk with your health care provider about your test results, treatment options, and if necessary, the need for more tests. Vaccines Your health care provider may recommend certain vaccines, such as:  Influenza vaccine. This is recommended every year.  Tetanus, diphtheria, and acellular pertussis (Tdap, Td) vaccine. You may need a Td booster every 10 years.  Varicella vaccine. You may need this if you have not been vaccinated.  Zoster vaccine. You may need this after age 57.  Measles, mumps, and rubella (MMR) vaccine. You may need at least one dose of MMR if you were born in 1957 or later. You may also need a second dose.  Pneumococcal 13-valent conjugate (PCV13) vaccine. One dose is recommended after age 39.  Pneumococcal polysaccharide (PPSV23) vaccine. One dose is recommended after age 60.  Meningococcal vaccine. You may need this if you have certain conditions.  Hepatitis A vaccine. You may need this if you have certain conditions or if you travel or work in places where you  may be exposed to hepatitis A.  Hepatitis B vaccine. You may need this if you have certain conditions or if you travel or work in places where you may be exposed to hepatitis B.  Haemophilus influenzae type b (Hib) vaccine. You may need this if you have certain risk factors.  Talk to your health care provider about which screenings and vaccines  you need and how often you need them. This information is not intended to replace advice given to you by your health care provider. Make sure you discuss any questions you have with your health care provider. Document Released: 04/15/2015 Document Revised: 12/07/2015 Document Reviewed: 01/18/2015 Elsevier Interactive Patient Education  [employer].

## 2017-06-07 NOTE — Assessment & Plan Note (Signed)
Well controlled, no changes to meds. Encouraged heart healthy diet such as the DASH diet and exercise as tolerated.  °

## 2017-06-07 NOTE — Assessment & Plan Note (Signed)
Encouraged ongoing hydration, no OTC meds without checking with [place]

## 2017-06-07 NOTE — Progress Notes (Signed)
Subjective:  I acted as a Education administrator for Dr. Charlett Blake. [name], [place]  Patient ID: GRIFFEN FRAYNE, male    DOB: Aug 25, 1945, 72 y.o.   MRN: [id number]  No chief complaint on file.   HPI  Patient is in today for a 6 month follow up and overall he is doing well. No recent febrile illness or hospitalizations. Is staying active and trying to eat a healthy diet. No polyuria or polydipsia. Denies CP/palp/SOB/HA/congestion/fevers/GI or GU c/o. Taking meds as prescribed  Patient Care Team: Mosie Lukes, MD as PCP - General (Family Medicine) Tat, Eustace Quail, DO as Consulting Physician (Neurology)   Past Medical History:  Diagnosis Date  . Anxiety state 12/23/2015  . Benign essential HTN 02/08/2015  . Cancer [facility])    Kidney Cancer  . Chronic kidney disease   . Chronic renal insufficiency 02/14/2015  . Constipation 12/23/2015  . Dyslipidemia 02/08/2015  . H/O renal cell carcinoma 02/08/2015  . History of blood transfusion 2010   After Kidney surgery  . History of chicken pox   . Hyperglycemia 12/23/2015  . Hyperlipidemia   . Hypertension   . Ingrown left big toenail 02/14/2015  . Left foot pain 02/14/2015  . Mitral valve prolapse   . MVP (mitral valve prolapse) 05/14/2016  . Parkinson disease ([place]) 12/23/2015  . Preventative health care 06/21/2016  . Thrombocytopenia ([place]) 02/08/2015  . Tremor of right hand 02/14/2015    Past Surgical History:  Procedure Laterality Date  . APPENDECTOMY  1995  . left knee scope  2003  . right knee scope  1999  . TOE SURGERY    . TONSILLECTOMY    . TOTAL KNEE ARTHROPLASTY Left 07/06/2014  . TOTAL NEPHRECTOMY Right     Family History  Problem Relation Age of Onset  . Alcohol abuse Father   . Hyperlipidemia Father   . Hypertension Father   . Diabetes Father   . Heart disease Father   . Cancer Maternal Aunt        lung cancer  . Cancer Paternal Uncle        bone cancer  . Heart attack Mother   . Stroke Mother        swelling in brain stem    Social  History   Socioeconomic History  . Marital status: Married    Spouse name: Not on file  . Number of children: Not on file  . Years of education: 80  . Highest education level: Not on file  Social Needs  . Financial resource strain: Not on file  . Food insecurity - worry: Not on file  . Food insecurity - inability: Not on file  . Transportation needs - medical: Not on file  . Transportation needs - non-medical: Not on file  Occupational History  . Occupation: retired Freight forwarder in [employer]  Tobacco Use  . Smoking status: Former Smoker    Types: Pipe    Last attempt to quit: 11/18/1995    Years since quitting: 21.5  . Smokeless tobacco: Never Used  . Tobacco comment: stopped 20 years ago.  1996  Substance and Sexual Activity  . Alcohol use: Yes    Alcohol/week: 0.0 oz    Comment: once a week   . Drug use: No  . Sexual activity: Yes    Comment: lives with wife, retired from Naval architect in plant, no major dietary restrictions   Other Topics Concern  . Not on file  Social History Narrative  .  Not on file    Outpatient Medications Prior to Visit  Medication Sig Dispense Refill  . amLODipine (NORVASC) 5 MG tablet Take 1 tablet (5 mg total) by mouth daily. 30 tablet 5  . aspirin 81 MG tablet Take 81 mg by mouth daily.    Marland Kitchen atenolol (TENORMIN) 100 MG tablet   2  . atorvastatin (LIPITOR) 20 MG tablet TAKE 1 TABLET BY MOUTH DAILY 90 tablet 0  . carbidopa-levodopa (SINEMET CR) 50-200 MG tablet TAKE 1 TABLET AT BEDTIME BY MOUTH. 90 tablet 1  . carbidopa-levodopa (SINEMET IR) 25-100 MG tablet 2 at 7am/1 at 11am/1 at 4pm 360 tablet 1  . escitalopram (LEXAPRO) 10 MG tablet TAKE 1 TABLET (10 MG TOTAL) BY MOUTH DAILY. 90 tablet 1  . latanoprost (XALATAN) 0.005 % ophthalmic solution PLACE 1 DROP INTO BOTH EYES NIGHTLY.    . Multiple Vitamins-Minerals (CENTRUM SILVER PO) Take by mouth daily.     No facility-administered medications prior to visit.     Allergies    Allergen Reactions  . Nsaids   . Sulfa Antibiotics Other (See Comments)    Had a reaction as a child  . Tolmetin Other (See Comments)    Review of Systems  Constitutional: Negative for fever and malaise/fatigue.  HENT: Negative for congestion.   Eyes: Negative for blurred vision.  Respiratory: Negative for shortness of breath.   Cardiovascular: Negative for chest pain, palpitations and leg swelling.  Gastrointestinal: Negative for abdominal pain, blood in stool and nausea.  Genitourinary: Negative for dysuria and frequency.  Musculoskeletal: Negative for falls.  Skin: Negative for rash.  Neurological: Negative for dizziness, loss of consciousness and headaches.  Endo/Heme/Allergies: Negative for environmental allergies.  Psychiatric/Behavioral: Negative for depression. The patient is not nervous/anxious.        Objective:    Physical Exam  Constitutional: He is oriented to person, place, and time. He appears well-developed and well-nourished. No distress.  HENT:  Head: Normocephalic and atraumatic.  Nose: Nose normal.  Eyes: Right eye exhibits no discharge. Left eye exhibits no discharge.  Neck: Normal range of motion. Neck supple.  Cardiovascular: Normal rate and regular rhythm.  No murmur heard. Pulmonary/Chest: Effort normal and breath sounds normal.  Abdominal: Soft. Bowel sounds are normal. There is no tenderness.  Musculoskeletal: He exhibits no edema.  Neurological: He is alert and oriented to person, place, and time.  Skin: Skin is warm and dry.  Psychiatric: He has a normal mood and affect.  Nursing note and vitals reviewed.   BP 124/64 (BP Location: Left Arm, Patient Position: Sitting, Cuff Size: Normal)   Pulse 63   Temp 98.4 F (36.9 C) (Oral)   Resp 18   Wt 247 lb (112 kg)   SpO2 96%   BMI 30.87 kg/m  Wt Readings from Last 3 Encounters:  06/07/17 247 lb (112 kg)  06/07/17 247 lb 12.8 oz (112.4 kg)  05/02/17 250 lb (113.4 kg)   BP Readings from  Last 3 Encounters:  06/07/17 124/64  06/07/17 126/64  05/02/17 128/82     Immunization History  Administered Date(s) Administered  . Influenza, High Dose Seasonal PF 12/23/2015, 12/10/2016  . Influenza-Unspecified 02/01/2015    Health Maintenance  Topic Date Due  . Hepatitis C Screening  1945-12-18  . TETANUS/TDAP  05/09/1962  . PNA vac Low Risk Adult (2 of 2 - PPSV23) 04/02/2012  . COLONOSCOPY  02/07/2021  . INFLUENZA VACCINE  Completed    Lab Results  Component Value Date  WBC 8.0 06/05/2017   HGB 15.2 06/05/2017   HCT 44.9 06/05/2017   PLT 165.0 06/05/2017   GLUCOSE 103 (H) 06/05/2017   CHOL 112 06/05/2017   TRIG 170.0 (H) 06/05/2017   HDL 42.40 06/05/2017   LDLCALC 36 06/05/2017   ALT 7 06/05/2017   AST 20 06/05/2017   NA 139 06/05/2017   K 4.2 06/05/2017   CL 104 06/05/2017   CREATININE 1.55 (H) 06/05/2017   BUN 22 06/05/2017   CO2 26 06/05/2017   TSH 3.44 06/05/2017   HGBA1C 6.2 06/05/2017    Lab Results  Component Value Date   TSH 3.44 06/05/2017   Lab Results  Component Value Date   WBC 8.0 06/05/2017   HGB 15.2 06/05/2017   HCT 44.9 06/05/2017   MCV 91.8 06/05/2017   PLT 165.0 06/05/2017   Lab Results  Component Value Date   NA 139 06/05/2017   K 4.2 06/05/2017   CO2 26 06/05/2017   GLUCOSE 103 (H) 06/05/2017   BUN 22 06/05/2017   CREATININE 1.55 (H) 06/05/2017   BILITOT 0.8 06/05/2017   ALKPHOS 72 06/05/2017   AST 20 06/05/2017   ALT 7 06/05/2017   PROT 6.9 06/05/2017   ALBUMIN 4.3 06/05/2017   CALCIUM 9.7 06/05/2017   GFR 46.81 (L) 06/05/2017   Lab Results  Component Value Date   CHOL 112 06/05/2017   Lab Results  Component Value Date   HDL 42.40 06/05/2017   Lab Results  Component Value Date   LDLCALC 36 06/05/2017   Lab Results  Component Value Date   TRIG 170.0 (H) 06/05/2017   Lab Results  Component Value Date   CHOLHDL 3 06/05/2017   Lab Results  Component Value Date   HGBA1C 6.2 06/05/2017          Assessment & Plan:   Problem List Items Addressed This Visit    Benign essential HTN    Well controlled, no changes to meds. Encouraged heart healthy diet such as the DASH diet and exercise as tolerated.       Relevant Orders   CBC   Comprehensive metabolic panel   TSH   T4, free   T3, free   Dyslipidemia    Encouraged heart healthy diet, increase exercise, avoid trans fats, consider a krill oil cap daily      Relevant Orders   Lipid panel   Thrombocytopenia (HCC)    Continue to monitor      Chronic renal insufficiency    Encouraged ongoing hydration, no OTC meds without checking with [place]      Hyperglycemia    hgba1c acceptable, minimize simple carbs. Increase exercise as tolerated.       Relevant Orders   Hemoglobin A1c   Increased thyroid stimulating hormone (TSH) level    Still WNL but rising from previous readings. Will add free T4 and free T3      Relevant Orders   T4, free   T3, free      I am having [name] maintain his aspirin, Multiple Vitamins-Minerals (CENTRUM SILVER PO), latanoprost, amLODipine, atenolol, atorvastatin, carbidopa-levodopa, escitalopram, and carbidopa-levodopa.  No orders of the defined types were placed in this encounter.   CMA served as Education administrator during this visit. History, Physical and Plan performed by medical provider. Documentation and orders reviewed and attested to.  [name], MD

## 2017-06-07 NOTE — Assessment & Plan Note (Signed)
hgba1c acceptable, minimize simple carbs. Increase exercise as tolerated.  

## 2017-06-07 NOTE — Assessment & Plan Note (Signed)
Continue to monitor

## 2017-06-07 NOTE — Assessment & Plan Note (Signed)
Encouraged heart healthy diet, increase exercise, avoid trans fats, consider a krill oil cap daily 

## 2017-06-07 NOTE — Patient Instructions (Signed)
Continue to eat heart healthy diet (full of fruits, vegetables, whole grains, lean protein, water--limit salt, fat, and sugar intake) and increase physical activity as tolerated.  Continue doing brain stimulating activities (puzzles, reading, adult coloring books, staying active) to keep memory sharp.   Bring a copy of your living will and/or healthcare power of attorney to your next office visit.  I will fax the record release form to Dr.Ha's office to find out about your Pneumonia Vaccine history.    Vincent Walker , Thank you for taking time to come for your Medicare Wellness Visit. I appreciate your ongoing commitment to your health goals. Please review the following plan we discussed and let me know if I can assist you in the future.   These are the goals we discussed: Goals    . Patient Stated     Remain physically active        This is a list of the screening recommended for you and due dates:  Health Maintenance  Topic Date Due  .  Hepatitis C: One time screening is recommended by Center for Disease Control  (CDC) for  adults born from [birth year] through 1967.   02/28/1946  . Tetanus Vaccine  05/09/1962  . Pneumonia vaccines (2 of 2 - PPSV23) 04/02/2012  . Colon Cancer Screening  02/07/2021  . Flu Shot  Completed     Health Maintenance, Male A healthy lifestyle and preventive care is important for your health and wellness. Ask your health care provider about what schedule of regular examinations is right for you. What should I know about weight and diet? Eat a Healthy Diet  Eat plenty of vegetables, fruits, whole grains, low-fat dairy products, and lean protein.  Do not eat a lot of foods high in solid fats, added sugars, or salt.  Maintain a Healthy Weight Regular exercise can help you achieve or maintain a healthy weight. You should:  Do at least 150 minutes of exercise each week. The exercise should increase your heart rate and make you sweat (moderate-intensity  exercise).  Do strength-training exercises at least twice a week.  Watch Your Levels of Cholesterol and Blood Lipids  Have your blood tested for lipids and cholesterol every 5 years starting at 74 years of age. If you are at high risk for heart disease, you should start having your blood tested when you are 74 years old. You may need to have your cholesterol levels checked more often if: ? Your lipid or cholesterol levels are high. ? You are older than 75 years of age. ? You are at high risk for heart disease.  What should I know about cancer screening? Many types of cancers can be detected early and may often be prevented. Lung Cancer  You should be screened every year for lung cancer if: ? You are a current smoker who has smoked for at least 30 years. ? You are a former smoker who has quit within the past 15 years.  Talk to your health care provider about your screening options, when you should start screening, and how often you should be screened.  Colorectal Cancer  Routine colorectal cancer screening usually begins at 74 years of age and should be repeated every 5-10 years until you are 74 years old. You may need to be screened more often if early forms of precancerous polyps or small growths are found. Your health care provider may recommend screening at an earlier age if you have risk factors for colon cancer.  Your health care provider may recommend using home test kits to check for hidden blood in the stool.  A small camera at the end of a tube can be used to examine your colon (sigmoidoscopy or colonoscopy). This checks for the earliest forms of colorectal cancer.  Prostate and Testicular Cancer  Depending on your age and overall health, your health care provider may do certain tests to screen for prostate and testicular cancer.  Talk to your health care provider about any symptoms or concerns you have about testicular or prostate cancer.  Skin Cancer  Check your skin  from head to toe regularly.  Tell your health care provider about any new moles or changes in moles, especially if: ? There is a change in a mole's size, shape, or color. ? You have a mole that is larger than a pencil eraser.  Always use sunscreen. Apply sunscreen liberally and repeat throughout the day.  Protect yourself by wearing long sleeves, pants, a wide-brimmed hat, and sunglasses when outside.  What should I know about heart disease, diabetes, and high blood pressure?  If you are 55-52 years of age, have your blood pressure checked every 3-5 years. If you are 34 years of age or older, have your blood pressure checked every year. You should have your blood pressure measured twice-once when you are at a hospital or clinic, and once when you are not at a hospital or clinic. Record the average of the two measurements. To check your blood pressure when you are not at a hospital or clinic, you can use: ? An automated blood pressure machine at a pharmacy. ? A home blood pressure monitor.  Talk to your health care provider about your target blood pressure.  If you are between 31-8 years old, ask your health care provider if you should take aspirin to prevent heart disease.  Have regular diabetes screenings by checking your fasting blood sugar level. ? If you are at a normal weight and have a low risk for diabetes, have this test once every three years after the age of 67. ? If you are overweight and have a high risk for diabetes, consider being tested at a younger age or more often.  A one-time screening for abdominal aortic aneurysm (AAA) by ultrasound is recommended for men aged 17-75 years who are current or former smokers. What should I know about preventing infection? Hepatitis B If you have a higher risk for hepatitis B, you should be screened for this virus. Talk with your health care provider to find out if you are at risk for hepatitis B infection. Hepatitis C Blood testing is  recommended for:  Everyone born from [birth year] through 1965.  Anyone with known risk factors for hepatitis C.  Sexually Transmitted Diseases (STDs)  You should be screened each year for STDs including gonorrhea and chlamydia if: ? You are sexually active and are younger than 74 years of age. ? You are older than 74 years of age and your health care provider tells you that you are at risk for this type of infection. ? Your sexual activity has changed since you were last screened and you are at an increased risk for chlamydia or gonorrhea. Ask your health care provider if you are at risk.  Talk with your health care provider about whether you are at high risk of being infected with HIV. Your health care provider may recommend a prescription medicine to help prevent HIV infection.  What else can I do?  Schedule regular health, dental, and eye exams.  Stay current with your vaccines (immunizations).  Do not use any tobacco products, such as cigarettes, chewing tobacco, and e-cigarettes. If you need help quitting, ask your health care provider.  Limit alcohol intake to no more than 2 drinks per day. One drink equals 12 ounces of beer, 5 ounces of wine, or 1 ounces of hard liquor.  Do not use street drugs.  Do not share needles.  Ask your health care provider for help if you need support or information about quitting drugs.  Tell your health care provider if you often feel depressed.  Tell your health care provider if you have ever been abused or do not feel safe at home. This information is not intended to replace advice given to you by your health care provider. Make sure you discuss any questions you have with your health care provider. Document Released: 09/15/2007 Document Revised: 11/16/2015 Document Reviewed: 12/21/2014 Elsevier Interactive Patient Education  [employer].

## 2017-06-10 ENCOUNTER — Other Ambulatory Visit: Payer: Self-pay | Admitting: Family Medicine

## 2017-06-10 MED FILL — ATENOLOL 100 MG TAB: 100 | 90 days supply | Qty: 45 | Fill #1

## 2017-06-10 MED FILL — AMLODIPINE BESYLATE 5 MG TA: 5 | 30 days supply | Qty: 30 | Fill #1

## 2017-06-11 MED FILL — ATORVASTATIN 20 MG TABLET: 20 | 90 days supply | Qty: 90 | Fill #0

## 2017-06-17 ENCOUNTER — Telehealth: Payer: Self-pay | Admitting: *Deleted

## 2017-06-17 NOTE — Telephone Encounter (Signed)
Received Medical records from [facility] [place]; forwarded to provider/SLS 03/18

## 2017-06-18 ENCOUNTER — Other Ambulatory Visit: Payer: Medicare Other

## 2017-06-25 ENCOUNTER — Ambulatory Visit: Payer: Medicare Other | Admitting: Family Medicine

## 2017-07-10 DIAGNOSIS — Z85528 Personal history of other malignant neoplasm of kidney: Secondary | ICD-10-CM | POA: Diagnosis not present

## 2017-07-10 DIAGNOSIS — Z08 Encounter for follow-up examination after completed treatment for malignant neoplasm: Secondary | ICD-10-CM | POA: Diagnosis not present

## 2017-07-10 DIAGNOSIS — N183 Chronic kidney disease, stage 3 (moderate): Secondary | ICD-10-CM | POA: Diagnosis not present

## 2017-07-12 MED FILL — AMLODIPINE BESYLATE 5 MG TA: 5 | 30 days supply | Qty: 30 | Fill #2

## 2017-08-09 MED FILL — CARBIDOPA-LEVO ER 50-200 TA: 50-200 | 90 days supply | Qty: 90 | Fill #1

## 2017-08-09 MED FILL — ESCITALOPRAM 10 MG TABLET: 10 | 90 days supply | Qty: 90 | Fill #1

## 2017-08-09 MED FILL — CARBIDOPA-LEVODOPA 25-100 T: 25-100 | 90 days supply | Qty: 360 | Fill #1

## 2017-08-12 MED FILL — AMLODIPINE BESYLATE 5 MG TA: 5 | 30 days supply | Qty: 30 | Fill #3

## 2017-08-29 NOTE — Progress Notes (Signed)
Vincent Walker was seen today in the movement disorders clinic for neurologic consultation at the request of Mosie Lukes, MD.  The consultation is for the evaluation of R hand tremor and shuffling.  This patient is accompanied in the office by his spouse who supplements the history.  02/17/16 update:  The patient was diagnosed with Parkinson's disease last visit and started on carbidopa/levodopa 25/100, one tablet 3 times per day (7am/11:30am/bedtime).  States that he dosed it this way because he slept better with the bedtime dosing.  The patient had an MRI of the brain on 11/30/2015 with and without gadolinium.  I had the opportunity to review this.  It was normal.  In regards to his restless leg syndrome, the patient states that he is doing well.  In regards to mood, he states that it isn't good and there are mood swings.  They watch the grandkids and the noise and lack of peace will irritate him.  He doesn't have energy and he can't do the things that he used to be able to do.  Pt denies falls.  Pt denies lightheadedness, near syncope.  No hallucinations.  He is on stationary bike at home 3-5 times per week.    05/22/16 update:  Patient follows up today.  He is on carbidopa/levodopa 25/100, one tablet 3 times per day.  Overall, the patient feels he has been stable. Had 4-5 episodes since our last visit of RLS.  Is on CR q hs.   Denies falls.  Denies lightheadedness or near syncope.  No hallucinations.  Mood has been good.  Continues to ride his exercise bike but not as much because of foot pain.  Having hammertoe surgery on 05/24/2016.  Was already given RX for zofran.     09/25/16 update:  Patient today in follow-up.  He is on carbidopa/levodopa 25/100, one tablet 3 times per day (7-8am; 11:30-1pm; 5:30-6:30pm) and last visit I told him to take his carbidopa/levodopa 50/200 about an hour before bedtime because of his restless leg syndrome.  His wife states that as long as he takes it at 8:30-9, his  RLS is well controlled.  He is on lexapro, which can make RLS a little worse, but it is helping mood.  Pt denies falls.  Pt denies lightheadedness, near syncope.  No hallucinations.  Mood has been good.  He is riding his bike 3-4 days per week.  He had hammertoe surgery on the L and he did really well.  05/02/17 update: Patient seen in follow-up.  He is on carbidopa/levodopa 25/100, 1 tablet 3 times per day and carbidopa/levodopa 50/200 1-hour prior to bedtime. Pt did have a fall over the box garden fence outside and didn't get hurt.  On another occasion, he slid on wood and didn't get hurt.  Did fall out of the bed while dreaming - had a vivid dream and fell off of the bed.  Pt denies lightheadedness, near syncope.  No hallucinations.  Mood has been good.  Wife thinks getting a little more forgetful in terms of word finding.  The records that were made available to me were reviewed.  Has been seen by sports med for piriformis syndrome.  Tried CBD oil and that helped.  Is sleepy in day (deny OSAS) but not exercising.  09/02/17 update: Pt seen in f/u.  I increased his medication to carbidopa/levodopa 25/100, 2/1/1.  He is also on carbidopa/levodopa 50/200 at bed.  He will wake up in the AM with  tremor but goes away quickly after taking carbidopa/levodopa 25/100.  [place] but it was because the dogs wrapped their leash around his legs and other dogs appeared and his dogs were scared and pulled the leash and he "sat down" on the ground.   Pt denies lightheadedness, near syncope.  No hallucinations.  Mood has been good.  In regards to RBD he reports that he is doing "nothing really" but wife states that he talks a lot in his sleep.  The records that were made available to me were reviewed.  Saw Dr. Charlett Blake on 06/07/17.  Labs were done.  No changes in med.  Riding bike and walking for exercise.     PREVIOUS MEDICATIONS: zoloft (felt more depressed when started taking that - only 25 mg and didn't take  for long)  ALLERGIES:   Allergies  Allergen Reactions  . Nsaids   . Sulfa Antibiotics Other (See Comments)    Had a reaction as a child  . Tolmetin Other (See Comments)    CURRENT MEDICATIONS:  Outpatient Encounter Medications as of 09/02/2017  Medication Sig  . amLODipine (NORVASC) 5 MG tablet Take 1 tablet (5 mg total) by mouth daily.  Marland Kitchen aspirin 81 MG tablet Take 81 mg by mouth daily.  Marland Kitchen atenolol (TENORMIN) 100 MG tablet   . atorvastatin (LIPITOR) 20 MG tablet TAKE 1 TABLET BY MOUTH DAILY  . carbidopa-levodopa (SINEMET CR) 50-200 MG tablet TAKE 1 TABLET AT BEDTIME BY MOUTH.  . carbidopa-levodopa (SINEMET IR) 25-100 MG tablet 2 at 7am/1 at 11am/1 at 4pm  . escitalopram (LEXAPRO) 10 MG tablet TAKE 1 TABLET (10 MG TOTAL) BY MOUTH DAILY.  Marland Kitchen latanoprost (XALATAN) 0.005 % ophthalmic solution PLACE 1 DROP INTO BOTH EYES NIGHTLY.  . Multiple Vitamins-Minerals (CENTRUM SILVER PO) Take by mouth daily.   No facility-administered encounter medications on file as of 09/02/2017.     PAST MEDICAL HISTORY:   Past Medical History:  Diagnosis Date  . Anxiety state 12/23/2015  . Benign essential HTN 02/08/2015  . Cancer [facility])    Kidney Cancer  . Chronic kidney disease   . Chronic renal insufficiency 02/14/2015  . Constipation 12/23/2015  . Dyslipidemia 02/08/2015  . H/O renal cell carcinoma 02/08/2015  . History of blood transfusion 2010   After Kidney surgery  . History of chicken pox   . Hyperglycemia 12/23/2015  . Hyperlipidemia   . Hypertension   . Ingrown left big toenail 02/14/2015  . Left foot pain 02/14/2015  . Mitral valve prolapse   . MVP (mitral valve prolapse) 05/14/2016  . Parkinson disease ([place]) 12/23/2015  . Preventative health care 06/21/2016  . Thrombocytopenia ([place]) 02/08/2015  . Tremor of right hand 02/14/2015    PAST SURGICAL HISTORY:   Past Surgical History:  Procedure Laterality Date  . APPENDECTOMY  1995  . left knee scope  2003  . right knee scope  1999  . TOE  SURGERY    . TONSILLECTOMY    . TOTAL KNEE ARTHROPLASTY Left 07/06/2014  . TOTAL NEPHRECTOMY Right     SOCIAL HISTORY:   Social History   Socioeconomic History  . Marital status: Married    Spouse name: Not on file  . Number of children: Not on file  . Years of education: 49  . Highest education level: Not on file  Occupational History  . Occupation: retired Freight forwarder in [place]  . Financial resource strain: Not on file  .  Food insecurity:    Worry: Not on file    Inability: Not on file  . Transportation needs:    Medical: Not on file    Non-medical: Not on file  Tobacco Use  . Smoking status: Former Smoker    Types: Pipe    Last attempt to quit: 11/18/1995    Years since quitting: 21.8  . Smokeless tobacco: Never Used  . Tobacco comment: stopped 20 years ago.  1996  Substance and Sexual Activity  . Alcohol use: Yes    Alcohol/week: 0.0 oz    Comment: once a week   . Drug use: No  . 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 Physical activity:    Days per week: Not on file    Minutes per session: Not on file  . Stress: Not on file  Relationships  . Social connections:    Talks on phone: Not on file    Gets together: Not on file    Attends religious service: Not on file    Active member of club or organization: Not on file    Attends meetings of clubs or organizations: Not on file    Relationship status: Not on file  . Intimate partner violence:    Fear of current or ex partner: Not on file    Emotionally abused: Not on file    Physically abused: Not on file    Forced sexual activity: Not on file  Other Topics Concern  . Not on file  Social History Narrative  . Not on file    FAMILY HISTORY:   Family Status  Relation Name Status  . Father 60,cigarettes Deceased  . Mat Aunt  Deceased  . Annamarie Major  Deceased  . Mother 82 Deceased    . Daughter 85 Alive  . Son 21 Alive  . MGM  Deceased at age 36  . MGF  Deceased at age 75  . PGM  Deceased at age 49  . PGF  Deceased at age 16  . Daughter 58 Alive    ROS:  Review of Systems  Constitutional: Negative.   HENT: Negative.   Eyes: Negative.   Respiratory: Negative.   Cardiovascular: Negative.   Gastrointestinal: Negative.   Genitourinary: Negative.   Musculoskeletal: Negative.   Skin: Negative.   Neurological: Positive for tremors.  Endo/Heme/Allergies: Negative.      PHYSICAL EXAMINATION:    VITALS:   Vitals:   09/02/17 1105  BP: 138/76  Pulse: 66  SpO2: 95%  Weight: 241 lb (109.3 kg)  Height: 6\' 3"  (1.905 m)   GEN:  The patient appears stated age and is in NAD. HEENT:  Normocephalic, atraumatic.  The mucous membranes are moist. The superficial temporal arteries are without ropiness or tenderness. CV:  RRR Lungs:  CTAB Neck/HEME:  There are no carotid bruits bilaterally.  Neurological examination:  Orientation: The patient is alert and oriented x3. Cranial nerves: There is good facial symmetry. The speech is fluent and clear. Soft palate rises symmetrically and there is no tongue deviation. Hearing is intact to conversational tone. Sensation: Sensation is intact to light touch throughout Motor: Strength is 5/5 in the bilateral upper and lower extremities.   Shoulder shrug is equal and symmetric.  There is no pronator drift.  Movement examination: Tone: There is mild increased tone in the right upper extremity.  Otherwise, tone is normal  elsewhere. Abnormal movements: There is right upper extremity resting tremor Coordination:  There is no decremation with RAM's, with any form of RAMS, including alternating supination and pronation of the forearm, hand opening and closing, finger taps, heel taps and toe taps. Gait and Station: The patient has no difficulty arising out of a deep-seated chair without the use of the hands. The patient's stride length  is normal.  The patient has a negative pull test.       LABS    Chemistry      Component Value Date/Time   NA 139 06/05/2017 0858   K 4.2 06/05/2017 0858   CL 104 06/05/2017 0858   CO2 26 06/05/2017 0858   BUN 22 06/05/2017 0858   CREATININE 1.55 (H) 06/05/2017 0858      Component Value Date/Time   CALCIUM 9.7 06/05/2017 0858   ALKPHOS 72 06/05/2017 0858   AST 20 06/05/2017 0858   ALT 7 06/05/2017 0858   BILITOT 0.8 06/05/2017 0858     Lab Results  Component Value Date   WBC 8.0 06/05/2017   HGB 15.2 06/05/2017   HCT 44.9 06/05/2017   MCV 91.8 06/05/2017   PLT 165.0 06/05/2017   Lab Results  Component Value Date   VITAMINB12 886 11/18/2015     Lab Results  Component Value Date   IRON 71 11/18/2015    Lab Results  Component Value Date   TSH 3.44 06/05/2017     ASSESSMENT/PLAN:  1.  Idiopathic Parkinson's disease, diagnosed August, 2017.     -We discussed the diagnosis as well as pathophysiology of the disease.  We discussed treatment options as well as prognostic indicators.  Patient education was provided.  -Patient will continue carbidopa/levodopa 25/100, 2/1/1.  I did tell him he can take an extra as needed so long as he did not exceed 5 tablets/day.  Risks, benefits, side effects and alternative therapies were discussed.  The opportunity to ask questions was given and they were answered to the best of my ability.  The patient expressed understanding and willingness to follow the outlined treatment protocols.  -continue carbidopa/levodopa 50/200 1 hour prior to bedtime due to RLS at bedtime  -talked to him about exercise and needs to encorporate that into daily.    -handicap placard form filled out  -Had several questions and I answered them to the best of my ability.  His daughters were wondering about clinical trials.  Given the information to clinical [email or web address].  They were interested in the study that is not in the [place].  2.  RLS  -The  levodopa has helped 1 hour prior to bed  3.  Depression and anxiety  -Can be associated with Parkinson's disease.    -continue lexapro - 10 mg q day.  Could be making RLS worse but benefits outweigh risks  4.  RBD  -Doing okay right now in this regard.  Does not want medication for this.  5.  Follow up is anticipated in the next few months, sooner should new neurologic issues arise.  Much greater than 50% of this visit was spent in counseling and coordinating care.  Total face to face time:  25 min

## 2017-09-02 ENCOUNTER — Ambulatory Visit (INDEPENDENT_AMBULATORY_CARE_PROVIDER_SITE_OTHER): Payer: Medicare Other | Admitting: Neurology

## 2017-09-02 ENCOUNTER — Encounter: Payer: Self-pay | Admitting: Neurology

## 2017-09-02 VITALS — BP 138/76 | HR 66 | Ht 75.0 in | Wt 241.0 lb

## 2017-09-02 DIAGNOSIS — G4752 REM sleep behavior disorder: Secondary | ICD-10-CM

## 2017-09-02 DIAGNOSIS — G2581 Restless legs syndrome: Secondary | ICD-10-CM | POA: Diagnosis not present

## 2017-09-02 DIAGNOSIS — G20 Parkinson's disease: Secondary | ICD-10-CM

## 2017-09-02 NOTE — Patient Instructions (Signed)
You can look at the website clinicaltrials.gov and search Parkinsons disease  Registration is OPEN!    Third Annual Parkinson's Education Symposium   To register: [email or web address]      Search:  [employer] person attending individually Questions: [place], [place] or Janett [email or web address]    [facility]'s [facility] Movement [facility]  The [facility] team know that living well with a movement disorder extends far beyond our clinic walls. We are together with you. Our team is passionate about providing resources to you and your loved ones who are living with Parkinson's disease and movement disorders. Participate in these programs and join our community. These resources are free or low cost!   [facility] is adding:   Innovative educational programs for patients and caregivers.   Support groups for patients and caregivers living with Parkinson's disease.   Parkinson's specific exercise programs.   Custom tailored therapeutic programs that will benefit patient's living with Parkinson's disease.   We are in this together. You can help and contribute to grow these programs and resources in our community. 100% of the funds donated to the [facility] stays right here in our community to support patients and their caregivers.  To make a tax deductible contribution:  -ask for a [facility] Parkinson's envelope in the office today.  - call the Office of Institutional Advancement at [phone].      [facility] Ad[facility] with Parkinson's Thursdays 9:30-10:30 am May 2, May 9, & May 16 & Tuesdays 10:00-11:00 am June 4, June 18, & July 2 Elnora Morrison [employer], [address] [place]. To register: [phone] or email [email or web address]

## 2017-09-10 MED FILL — ATORVASTATIN 20 MG TABLET: 20 | 90 days supply | Qty: 90 | Fill #1

## 2017-09-10 MED FILL — AMLODIPINE BESYLATE 5 MG TA: 5 | 30 days supply | Qty: 30 | Fill #4

## 2017-09-10 MED FILL — ATENOLOL 100 MG TAB: 100 | 90 days supply | Qty: 45 | Fill #2

## 2017-09-23 DIAGNOSIS — H2512 Age-related nuclear cataract, left eye: Secondary | ICD-10-CM | POA: Diagnosis not present

## 2017-09-23 DIAGNOSIS — H2513 Age-related nuclear cataract, bilateral: Secondary | ICD-10-CM | POA: Diagnosis not present

## 2017-09-23 DIAGNOSIS — H401132 Primary open-angle glaucoma, bilateral, moderate stage: Secondary | ICD-10-CM | POA: Diagnosis not present

## 2017-11-08 ENCOUNTER — Other Ambulatory Visit: Payer: Self-pay | Admitting: Neurology

## 2017-11-08 MED FILL — CARBIDOPA-LEVO ER 50-200 TA: 50-200 | 90 days supply | Qty: 90 | Fill #0

## 2017-11-08 MED FILL — ESCITALOPRAM 10 MG TABLET: 10 | 90 days supply | Qty: 90 | Fill #0

## 2017-11-11 ENCOUNTER — Other Ambulatory Visit: Payer: Self-pay | Admitting: Neurology

## 2017-11-11 MED FILL — CARBIDOPA/LEVO 25/100 TAB: 25-100 | 90 days supply | Qty: 450 | Fill #0

## 2017-11-19 DIAGNOSIS — H2512 Age-related nuclear cataract, left eye: Secondary | ICD-10-CM | POA: Diagnosis not present

## 2017-11-27 DIAGNOSIS — H2511 Age-related nuclear cataract, right eye: Secondary | ICD-10-CM | POA: Diagnosis not present

## 2017-12-03 DIAGNOSIS — H2511 Age-related nuclear cataract, right eye: Secondary | ICD-10-CM | POA: Diagnosis not present

## 2017-12-03 DIAGNOSIS — H52221 Regular astigmatism, right eye: Secondary | ICD-10-CM | POA: Diagnosis not present

## 2017-12-09 ENCOUNTER — Other Ambulatory Visit: Payer: Self-pay | Admitting: Family Medicine

## 2017-12-10 DIAGNOSIS — Z85528 Personal history of other malignant neoplasm of kidney: Secondary | ICD-10-CM | POA: Diagnosis not present

## 2017-12-10 DIAGNOSIS — E785 Hyperlipidemia, unspecified: Secondary | ICD-10-CM | POA: Diagnosis not present

## 2017-12-10 DIAGNOSIS — G20 Parkinson's disease: Secondary | ICD-10-CM | POA: Diagnosis not present

## 2017-12-10 DIAGNOSIS — N183 Chronic kidney disease, stage 3 (moderate): Secondary | ICD-10-CM | POA: Diagnosis not present

## 2017-12-10 DIAGNOSIS — I129 Hypertensive chronic kidney disease with stage 1 through stage 4 chronic kidney disease, or unspecified chronic kidney disease: Secondary | ICD-10-CM | POA: Diagnosis not present

## 2017-12-10 DIAGNOSIS — Z905 Acquired absence of kidney: Secondary | ICD-10-CM | POA: Diagnosis not present

## 2017-12-11 MED FILL — ATENOLOL 100 MG TAB: 100 | 90 days supply | Qty: 45 | Fill #0

## 2017-12-11 MED FILL — ATORVASTATIN CALCIUM 20 MG: 20 | 90 days supply | Qty: 90 | Fill #0

## 2018-02-04 NOTE — Progress Notes (Signed)
Vincent Walker was seen today in the movement disorders clinic for neurologic consultation at the request of Mosie Lukes, MD.  The consultation is for the evaluation of R hand tremor and shuffling.  This patient is accompanied in the office by his spouse who supplements the history.  02/17/16 update:  The patient was diagnosed with Parkinson's disease last visit and started on carbidopa/levodopa 25/100, one tablet 3 times per day (7am/11:30am/bedtime).  States that he dosed it this way because he slept better with the bedtime dosing.  The patient had an MRI of the brain on 11/30/2015 with and without gadolinium.  I had the opportunity to review this.  It was normal.  In regards to his restless leg syndrome, the patient states that he is doing well.  In regards to mood, he states that it isn't good and there are mood swings.  They watch the grandkids and the noise and lack of peace will irritate him.  He doesn't have energy and he can't do the things that he used to be able to do.  Pt denies falls.  Pt denies lightheadedness, near syncope.  No hallucinations.  He is on stationary bike at home 3-5 times per week.    05/22/16 update:  Patient follows up today.  He is on carbidopa/levodopa 25/100, one tablet 3 times per day.  Overall, the patient feels he has been stable. Had 4-5 episodes since our last visit of RLS.  Is on CR q hs.   Denies falls.  Denies lightheadedness or near syncope.  No hallucinations.  Mood has been good.  Continues to ride his exercise bike but not as much because of foot pain.  Having hammertoe surgery on 05/24/2016.  Was already given RX for zofran.     09/25/16 update:  Patient today in follow-up.  He is on carbidopa/levodopa 25/100, one tablet 3 times per day (7-8am; 11:30-1pm; 5:30-6:30pm) and last visit I told him to take his carbidopa/levodopa 50/200 about an hour before bedtime because of his restless leg syndrome.  His wife states that as long as he takes it at 8:30-9, his  RLS is well controlled.  He is on lexapro, which can make RLS a little worse, but it is helping mood.  Pt denies falls.  Pt denies lightheadedness, near syncope.  No hallucinations.  Mood has been good.  He is riding his bike 3-4 days per week.  He had hammertoe surgery on the L and he did really well.  05/02/17 update: Patient seen in follow-up.  He is on carbidopa/levodopa 25/100, 1 tablet 3 times per day and carbidopa/levodopa 50/200 1-hour prior to bedtime. Pt did have a fall over the box garden fence outside and didn't get hurt.  On another occasion, he slid on wood and didn't get hurt.  Did fall out of the bed while dreaming - had a vivid dream and fell off of the bed.  Pt denies lightheadedness, near syncope.  No hallucinations.  Mood has been good.  Wife thinks getting a little more forgetful in terms of word finding.  The records that were made available to me were reviewed.  Has been seen by sports med for piriformis syndrome.  Tried CBD oil and that helped.  Is sleepy in day (deny OSAS) but not exercising.  09/02/17 update: Pt seen in f/u.  I increased his medication to carbidopa/levodopa 25/100, 2/1/1.  He is also on carbidopa/levodopa 50/200 at bed.  He will wake up in the AM with  tremor but goes away quickly after taking carbidopa/levodopa 25/100.  Pt [place] but it was because the dogs wrapped their leash around his legs and other dogs appeared and his dogs were scared and pulled the leash and he "sat down" on the ground.   Pt denies lightheadedness, near syncope.  No hallucinations.  Mood has been good.  In regards to RBD he reports that he is doing "nothing really" but wife states that he talks a lot in his sleep.  The records that were made available to me were reviewed.  Saw Dr. Charlett Blake on 06/07/17.  Labs were done.  No changes in med.  Riding bike and walking for exercise.    02/06/18 update: Patient is seen today in follow-up for Parkinson's disease.  This patient is accompanied in  the office by his spouse who supplements the history. Patient is on carbidopa/levodopa 25/100, 2 tablets in the morning, 1 in the afternoon and 1 in the evening.  Patient is on a carbidopa/levodopa 50/200 at bedtime.  He has had no falls.  No lightheadedness or near syncope.  Remains on Lexapro for mood.  That has been good.  Some intermittent trouble sleeping.   He had bilateral cataract surgery bilaterally since last visit. Some word finding trouble.     PREVIOUS MEDICATIONS: zoloft (felt more depressed when started taking that - only 25 mg and didn't take for long)  ALLERGIES:   Allergies  Allergen Reactions  . Nsaids   . Sulfa Antibiotics Other (See Comments)    Had a reaction as a child  . Tolmetin Other (See Comments)    CURRENT MEDICATIONS:  Outpatient Encounter Medications as of 02/06/2018  Medication Sig  . amLODipine (NORVASC) 5 MG tablet Take 1 tablet (5 mg total) by mouth daily.  Marland Kitchen aspirin 81 MG tablet Take 81 mg by mouth daily.  Marland Kitchen atenolol (TENORMIN) 100 MG tablet TAKE 1/2 TABLET BY MOUTH DAILY  . atorvastatin (LIPITOR) 20 MG tablet TAKE 1 TABLET BY MOUTH DAILY  . carbidopa-levodopa (SINEMET CR) 50-200 MG tablet TAKE 1 TABLET AT BEDTIME BY MOUTH.  . carbidopa-levodopa (SINEMET IR) 25-100 MG tablet 2 in the morning, 1 in the afternoon, 1 in the evening, 1 extra PRN daily  . escitalopram (LEXAPRO) 10 MG tablet TAKE 1 TABLET (10 MG TOTAL) BY MOUTH DAILY.  Marland Kitchen latanoprost (XALATAN) 0.005 % ophthalmic solution PLACE 1 DROP INTO BOTH EYES NIGHTLY.  . Multiple Vitamins-Minerals (CENTRUM SILVER PO) Take by mouth daily.  . [DISCONTINUED] atenolol (TENORMIN) 100 MG tablet   . [DISCONTINUED] carbidopa-levodopa (SINEMET IR) 25-100 MG tablet 2 at 7am/1 at 11am/1 at 4pm   No facility-administered encounter medications on file as of 02/06/2018.     PAST MEDICAL HISTORY:   Past Medical History:  Diagnosis Date  . Anxiety state 12/23/2015  . Benign essential HTN 02/08/2015  . Cancer [facility])     Kidney Cancer  . Chronic kidney disease   . Chronic renal insufficiency 02/14/2015  . Constipation 12/23/2015  . Dyslipidemia 02/08/2015  . H/O renal cell carcinoma 02/08/2015  . History of blood transfusion 2010   After Kidney surgery  . History of chicken pox   . Hyperglycemia 12/23/2015  . Hyperlipidemia   . Hypertension   . Ingrown left big toenail 02/14/2015  . Left foot pain 02/14/2015  . Mitral valve prolapse   . MVP (mitral valve prolapse) 05/14/2016  . Parkinson disease ([place]) 12/23/2015  . Preventative health care 06/21/2016  . Thrombocytopenia (  [place]) 02/08/2015  . Tremor of right hand 02/14/2015    PAST SURGICAL HISTORY:   Past Surgical History:  Procedure Laterality Date  . APPENDECTOMY  1995  . CATARACT EXTRACTION, BILATERAL    . left knee scope  2003  . right knee scope  1999  . TOE SURGERY    . TONSILLECTOMY    . TOTAL KNEE ARTHROPLASTY Left 07/06/2014  . TOTAL NEPHRECTOMY Right     SOCIAL HISTORY:   Social History   Socioeconomic History  . Marital status: Married    Spouse name: Not on file  . Number of children: Not on file  . Years of education: 34  . Highest education level: Not on file  Occupational History  . Occupation: retired Freight forwarder in [place]  . Financial resource strain: Not on file  . Food insecurity:    Worry: Not on file    Inability: Not on file  . Transportation needs:    Medical: Not on file    Non-medical: Not on file  Tobacco Use  . Smoking status: Former Smoker    Types: Pipe    Last attempt to quit: 11/18/1995    Years since quitting: 22.2  . Smokeless tobacco: Never Used  . Tobacco comment: stopped 20 years ago.  1996  Substance and Sexual Activity  . Alcohol use: Yes    Alcohol/week: 0.0 standard drinks    Comment: once a week   . Drug use: No  . 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  Physical activity:    Days per week: Not on file    Minutes per session: Not on file  . Stress: Not on file  Relationships  . Social connections:    Talks on phone: Not on file    Gets together: Not on file    Attends religious service: Not on file    Active member of club or organization: Not on file    Attends meetings of clubs or organizations: Not on file    Relationship status: Not on file  . Intimate partner violence:    Fear of current or ex partner: Not on file    Emotionally abused: Not on file    Physically abused: Not on file    Forced sexual activity: Not on file  Other Topics Concern  . Not on file  Social History Narrative  . Not on file    FAMILY HISTORY:   Family Status  Relation Name Status  . Father 60,cigarettes Deceased  . Mat Aunt  Deceased  . Annamarie Major  Deceased  . Mother 31 Deceased  . Daughter 77 Alive  . Son 57 Alive  . MGM  Deceased at age 54  . MGF  Deceased at age 44  . PGM  Deceased at age 89  . PGF  Deceased at age 68  . Daughter 60 Alive    ROS:  Review of Systems  Constitutional: Positive for malaise/fatigue (easily fatigued).  HENT: Negative.   Eyes: Negative.   Respiratory: Negative.   Cardiovascular: Negative.   Gastrointestinal: Negative.   Genitourinary: Negative.   Musculoskeletal: Negative.   Skin: Negative.      PHYSICAL EXAMINATION:    VITALS:   Vitals:   02/06/18 1057  BP: 140/88  Pulse: 72  SpO2: 93%  Weight: 238 lb (108 kg)  Height: 6\' 3"  (1.905  m)   GEN:  The patient appears stated age and is in NAD. HEENT:  Normocephalic, atraumatic.  The mucous membranes are moist. The superficial temporal arteries are without ropiness or tenderness. CV:  RRR Lungs:  CTAB Neck/HEME:  There are no carotid bruits bilaterally.  Neurological examination:  Orientation: The patient is alert and oriented x3. Cranial nerves: There is good facial symmetry. The speech is fluent and clear. Soft palate rises symmetrically and  there is no tongue deviation. Hearing is intact to conversational tone. Sensation: Sensation is intact to light touch throughout Motor: Strength is 5/5 in the bilateral upper and lower extremities.   Shoulder shrug is equal and symmetric.  There is no pronator drift.  Movement examination: Tone: There is normal tone in the UE/LE bilaterally Abnormal movements: There is right upper extremity resting tremor Coordination:  There is no decremation with RAM's, with any form of RAMS, including alternating supination and pronation of the forearm, hand opening and closing, finger taps, heel taps and toe taps. Gait and Station: The patient has no difficulty arising out of a deep-seated chair without the use of the hands. The patient's stride length is normal.      LABS    Chemistry      Component Value Date/Time   NA 139 06/05/2017 0858   K 4.2 06/05/2017 0858   CL 104 06/05/2017 0858   CO2 26 06/05/2017 0858   BUN 22 06/05/2017 0858   CREATININE 1.55 (H) 06/05/2017 0858      Component Value Date/Time   CALCIUM 9.7 06/05/2017 0858   ALKPHOS 72 06/05/2017 0858   AST 20 06/05/2017 0858   ALT 7 06/05/2017 0858   BILITOT 0.8 06/05/2017 0858     Lab Results  Component Value Date   WBC 8.0 06/05/2017   HGB 15.2 06/05/2017   HCT 44.9 06/05/2017   MCV 91.8 06/05/2017   PLT 165.0 06/05/2017   Lab Results  Component Value Date   IHWTUUEK80 034 11/18/2015     Lab Results  Component Value Date   IRON 71 11/18/2015    Lab Results  Component Value Date   TSH 3.44 06/05/2017     ASSESSMENT/PLAN:  1.  Idiopathic Parkinson's disease, diagnosed August, 2017.     -We discussed the diagnosis as well as pathophysiology of the disease.  We discussed treatment options as well as prognostic indicators.  Patient education was provided.  -Patient will continue carbidopa/levodopa 25/100, 2/1/1.  I did tell him he can take an extra as needed so long as he did not exceed 5 tablets/day.  Risks,  benefits, side effects and alternative therapies were discussed.  The opportunity to ask questions was given and they were answered to the best of my ability.  The patient expressed understanding and willingness to follow the outlined treatment protocols.  -continue carbidopa/levodopa 50/200 1 hour prior to bedtime due to RLS at bedtime  -talked to him about exercise and needs to encorporate that into daily.    2.  RLS  -The levodopa has helped 1 hour prior to bed  3.  Depression and anxiety  -Can be associated with Parkinson's disease.    -continue lexapro - 10 mg q day.  Could be making RLS worse but benefits outweigh risks  4.  RBD  -Doing okay right now in this regard.  Does not want medication for this.  -add melatonin for intermittent insomnia

## 2018-02-06 ENCOUNTER — Ambulatory Visit (INDEPENDENT_AMBULATORY_CARE_PROVIDER_SITE_OTHER): Payer: Medicare Other | Admitting: Neurology

## 2018-02-06 ENCOUNTER — Encounter: Payer: Self-pay | Admitting: Neurology

## 2018-02-06 VITALS — BP 140/88 | HR 72 | Ht 75.0 in | Wt 238.0 lb

## 2018-02-06 DIAGNOSIS — G20 Parkinson's disease: Secondary | ICD-10-CM

## 2018-02-06 DIAGNOSIS — G2581 Restless legs syndrome: Secondary | ICD-10-CM

## 2018-02-06 DIAGNOSIS — G4700 Insomnia, unspecified: Secondary | ICD-10-CM

## 2018-02-06 NOTE — Patient Instructions (Signed)
You can try melatonin - 3 mg at night for sleep.

## 2018-02-07 MED FILL — ESCITALOPRAM 10 MG TABLET: 10 | 90 days supply | Qty: 90 | Fill #1

## 2018-02-07 MED FILL — CARBIDOPA-LEVO ER 50-200 TA: 50-200 | 90 days supply | Qty: 90 | Fill #1

## 2018-02-17 MED FILL — AMOXICILLIN 500 MG CAPSULE: 500 | 4 days supply | Qty: 16 | Fill #0

## 2018-02-20 MED FILL — CARBIDOPA/LEVO 25/100 TAB: 25-100 | 90 days supply | Qty: 450 | Fill #1

## 2018-03-04 MED FILL — ATORVASTATIN CALCIUM 20 MG: 20 | 90 days supply | Qty: 90 | Fill #1

## 2018-03-04 MED FILL — ATENOLOL 100 MG TAB: 100 | 90 days supply | Qty: 45 | Fill #1

## 2018-04-17 DIAGNOSIS — H401132 Primary open-angle glaucoma, bilateral, moderate stage: Secondary | ICD-10-CM | POA: Diagnosis not present

## 2018-05-08 ENCOUNTER — Other Ambulatory Visit: Payer: Self-pay | Admitting: Neurology

## 2018-05-08 MED FILL — CARBIDOPA-LEVO ER 50-200 TA: 50-200 | 90 days supply | Qty: 90 | Fill #0

## 2018-05-08 MED FILL — ESCITALOPRAM 10 MG TABLET: 10 | 90 days supply | Qty: 90 | Fill #0

## 2018-05-23 ENCOUNTER — Telehealth: Payer: Self-pay | Admitting: Neurology

## 2018-05-23 NOTE — Telephone Encounter (Signed)
Patient is having trouble with hands shaking and RLS  Even with medication. He is also been talking about things going on in his head

## 2018-05-23 NOTE — Telephone Encounter (Signed)
Spoke with patient's wife (on [place]) she states they spoke last night and patient opened up to her about symptoms he has been having for while and she spoke with him about things she is noticing.  He has had an increase in tremors. His RLS is worse. He wakes up around 2 am with this every night and has a hard time sleeping. He is currently taking medication as follows:  Carbidopa Levodopa 25/100 IR - 2 tablets in the morning, 1 tablet in the afternoon, 1 in the evening, the extra daily tablet he has been taking at 2 am to help him get back to sleep. He takes Carbidopa Levodopa 50/200 CR at bedtime (9 pm) he had tried to move earlier, but found it wore off sooner. He is also taking Melatonin at bedtime. He is still on Lexapro 10 mg daily. No changes in medications.  He also told his wife he was having "delusional thoughts". No hallucinations. He will be in a room of people and feel alone. He is paranoid all the time and doesn't like her to leave.  They were requesting sooner appt to discuss all of these things. He is scheduled for April and on the wait list. Dr. Carles Collet - please advise.

## 2018-05-26 NOTE — Telephone Encounter (Signed)
Looks like patient is scheduled for 06/06/2018 to see Dr. Carles Collet.

## 2018-05-26 NOTE — Telephone Encounter (Signed)
This is too much to address via phone.  I have sent the office staff a message to get him in within a week or so.  Please let pt know that we should be calling soon.  Jade, please keep an eye on this to make sure that he gets an appointment.

## 2018-05-30 ENCOUNTER — Other Ambulatory Visit: Payer: Self-pay | Admitting: Family Medicine

## 2018-05-30 MED FILL — ATENOLOL 100 MG TAB: 100 | 90 days supply | Qty: 45 | Fill #2

## 2018-05-30 MED FILL — ATORVASTATIN 20 MG TABLET: 20 | 60 days supply | Qty: 60 | Fill #0

## 2018-06-04 ENCOUNTER — Telehealth: Payer: Self-pay | Admitting: Gastroenterology

## 2018-06-04 NOTE — Telephone Encounter (Signed)
Records will be placed on Dr.Gupta's desk for review.

## 2018-06-04 NOTE — Progress Notes (Signed)
Vincent Walker was seen today in the movement disorders clinic for neurologic consultation at the request of Mosie Lukes, MD.  The consultation is for the evaluation of R hand tremor and shuffling.  This patient is accompanied in the office by his spouse who supplements the history.  02/17/16 update:  The patient was diagnosed with Parkinson's disease last visit and started on carbidopa/levodopa 25/100, one tablet 3 times per day (7am/11:30am/bedtime).  States that he dosed it this way because he slept better with the bedtime dosing.  The patient had an MRI of the brain on 11/30/2015 with and without gadolinium.  I had the opportunity to review this.  It was normal.  In regards to his restless leg syndrome, the patient states that he is doing well.  In regards to mood, he states that it isn't good and there are mood swings.  They watch the grandkids and the noise and lack of peace will irritate him.  He doesn't have energy and he can't do the things that he used to be able to do.  Pt denies falls.  Pt denies lightheadedness, near syncope.  No hallucinations.  He is on stationary bike at home 3-5 times per week.    05/22/16 update:  Patient follows up today.  He is on carbidopa/levodopa 25/100, one tablet 3 times per day.  Overall, the patient feels he has been stable. Had 4-5 episodes since our last visit of RLS.  Is on CR q hs.   Denies falls.  Denies lightheadedness or near syncope.  No hallucinations.  Mood has been good.  Continues to ride his exercise bike but not as much because of foot pain.  Having hammertoe surgery on 05/24/2016.  Was already given RX for zofran.     09/25/16 update:  Patient today in follow-up.  He is on carbidopa/levodopa 25/100, one tablet 3 times per day (7-8am; 11:30-1pm; 5:30-6:30pm) and last visit I told him to take his carbidopa/levodopa 50/200 about an hour before bedtime because of his restless leg syndrome.  His wife states that as long as he takes it at 8:30-9, his  RLS is well controlled.  He is on lexapro, which can make RLS a little worse, but it is helping mood.  Pt denies falls.  Pt denies lightheadedness, near syncope.  No hallucinations.  Mood has been good.  He is riding his bike 3-4 days per week.  He had hammertoe surgery on the L and he did really well.  05/02/17 update: Patient seen in follow-up.  He is on carbidopa/levodopa 25/100, 1 tablet 3 times per day and carbidopa/levodopa 50/200 1-hour prior to bedtime. Pt did have a fall over the box garden fence outside and didn't get hurt.  On another occasion, he slid on wood and didn't get hurt.  Did fall out of the bed while dreaming - had a vivid dream and fell off of the bed.  Pt denies lightheadedness, near syncope.  No hallucinations.  Mood has been good.  Wife thinks getting a little more forgetful in terms of word finding.  The records that were made available to me were reviewed.  Has been seen by sports med for piriformis syndrome.  Tried CBD oil and that helped.  Is sleepy in day (deny OSAS) but not exercising.  09/02/17 update: Pt seen in f/u.  I increased his medication to carbidopa/levodopa 25/100, 2/1/1.  He is also on carbidopa/levodopa 50/200 at bed.  He will wake up in the AM with  tremor but goes away quickly after taking carbidopa/levodopa 25/100.  [place] but it was because the dogs wrapped their leash around his legs and other dogs appeared and his dogs were scared and pulled the leash and he "sat down" on the ground.   Pt denies lightheadedness, near syncope.  No hallucinations.  Mood has been good.  In regards to RBD he reports that he is doing "nothing really" but wife states that he talks a lot in his sleep.  The records that were made available to me were reviewed.  Saw Dr. Charlett Blake on 06/07/17.  Labs were done.  No changes in med.  Riding bike and walking for exercise.    02/06/18 update: Patient is seen today in follow-up for Parkinson's disease.  This patient is accompanied in  the office by his spouse who supplements the history. Patient is on carbidopa/levodopa 25/100, 2 tablets in the morning, 1 in the afternoon and 1 in the evening.  Patient is on a carbidopa/levodopa 50/200 at bedtime.  He has had no falls.  No lightheadedness or near syncope.  Remains on Lexapro for mood.  That has been good.  Some intermittent trouble sleeping.   He had bilateral cataract surgery bilaterally since last visit. Some word finding trouble.    06/06/18 update:  Pt seen in f/u for PD.  This patient is accompanied in the office by his spouse who supplements the history.  The records that were made available to me were reviewed.  Patient is currently on carbidopa/levodopa 25/100, 2 tablets in the morning, 1 in the afternoon and 1 in the evening and carbidopa/levodopa 50/200 at bedtime.  Seems to have more tremor and AM dose makes sleepy because of the 2 tablets.  His wife called very recently about some concerning symptoms, so we worked him into the office sooner than his previously scheduled appointment.  His restless leg was getting worse so he reports that he is taking an extra carbidopa/levodopa 25/100 IR in the middle of the night.  The bigger issue was that the patient was beginning to feel paranoid and does not like his wife to leave him alone.  He has no hallucinations.   He is still on Lexapro, 10 mg daily.  Doesn't think that it helps.  Pt states that "I think that I have come to the realization that parkinsons is more than I shake and I can just move on with my life."  He cannot do the things that he wants to do.  He gets frustrated when his wife wants to help.  He gets frustrated that his wife wants to hover over him to make sure that he doesn't get hurt.  He is angry at times and has outbursts.     PREVIOUS MEDICATIONS: zoloft (felt more depressed when started taking that - only 25 mg and didn't take for long)  ALLERGIES:   Allergies  Allergen Reactions  . Nsaids   . Sulfa Antibiotics  Other (See Comments)    Had a reaction as a child  . Tolmetin Other (See Comments)    CURRENT MEDICATIONS:  Outpatient Encounter Medications as of 06/06/2018  Medication Sig  . acetaminophen (TYLENOL) 500 MG tablet Take 500 mg by mouth every 6 (six) hours as needed.  Marland Kitchen amLODipine (NORVASC) 5 MG tablet Take 1 tablet (5 mg total) by mouth daily.  Marland Kitchen aspirin 81 MG tablet Take 81 mg by mouth daily.  Marland Kitchen atenolol (TENORMIN) 100 MG tablet TAKE  1/2 TABLET BY MOUTH DAILY  . atorvastatin (LIPITOR) 20 MG tablet TAKE 1 TABLET BY MOUTH DAILY  . carbidopa-levodopa (SINEMET CR) 50-200 MG tablet TAKE 1 TABLET AT BEDTIME BY MOUTH.  . carbidopa-levodopa (SINEMET IR) 25-100 MG tablet 2 in the morning, 1 in the afternoon, 1 in the evening, 1 extra PRN daily  . escitalopram (LEXAPRO) 10 MG tablet TAKE 1 TABLET (10 MG TOTAL) BY MOUTH DAILY.  Marland Kitchen latanoprost (XALATAN) 0.005 % ophthalmic solution PLACE 1 DROP INTO BOTH EYES NIGHTLY.  . Multiple Vitamins-Minerals (CENTRUM SILVER PO) Take by mouth daily.  . clonazePAM (KLONOPIN) 0.5 MG tablet Take 0.5 tablets (0.25 mg total) by mouth at bedtime.  . divalproex (DEPAKOTE ER) 250 MG 24 hr tablet Take 1 tablet (250 mg total) by mouth daily.   No facility-administered encounter medications on file as of 06/06/2018.     PAST MEDICAL HISTORY:   Past Medical History:  Diagnosis Date  . Anxiety state 12/23/2015  . Benign essential HTN 02/08/2015  . Cancer [facility])    Kidney Cancer  . Chronic kidney disease   . Chronic renal insufficiency 02/14/2015  . Constipation 12/23/2015  . Dyslipidemia 02/08/2015  . H/O renal cell carcinoma 02/08/2015  . History of blood transfusion 2010   After Kidney surgery  . History of chicken pox   . Hyperglycemia 12/23/2015  . Hyperlipidemia   . Hypertension   . Ingrown left big toenail 02/14/2015  . Left foot pain 02/14/2015  . Mitral valve prolapse   . MVP (mitral valve prolapse) 05/14/2016  . Parkinson disease ([place]) 12/23/2015  .  Preventative health care 06/21/2016  . Thrombocytopenia ([place]) 02/08/2015  . Tremor of right hand 02/14/2015    PAST SURGICAL HISTORY:   Past Surgical History:  Procedure Laterality Date  . APPENDECTOMY  1995  . CATARACT EXTRACTION, BILATERAL    . left knee scope  2003  . right knee scope  1999  . TOE SURGERY    . TONSILLECTOMY    . TOTAL KNEE ARTHROPLASTY Left 07/06/2014  . TOTAL NEPHRECTOMY Right     SOCIAL HISTORY:   Social History   Socioeconomic History  . Marital status: Married    Spouse name: Not on file  . Number of children: Not on file  . Years of education: 60  . Highest education level: Not on file  Occupational History  . Occupation: retired Freight forwarder in [place]  . Financial resource strain: Not on file  . Food insecurity:    Worry: Not on file    Inability: Not on file  . Transportation needs:    Medical: Not on file    Non-medical: Not on file  Tobacco Use  . Smoking status: Former Smoker    Types: Pipe    Last attempt to quit: 11/18/1995    Years since quitting: 22.5  . Smokeless tobacco: Never Used  . Tobacco comment: stopped 20 years ago.  1996  Substance and Sexual Activity  . Alcohol use: Yes    Alcohol/week: 0.0 standard drinks    Comment: once a week   . Drug use: No  . 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 Physical activity:    Days per week: Not on file    Minutes per session: Not on file  . Stress: Not on file  Relationships  . Social connections:    Talks  on phone: Not on file    Gets together: Not on file    Attends religious service: Not on file    Active member of club or organization: Not on file    Attends meetings of clubs or organizations: Not on file    Relationship status: Not on file  . Intimate partner violence:    Fear of current or ex partner: Not on file    Emotionally abused: Not on file      Physically abused: Not on file    Forced sexual activity: Not on file  Other Topics Concern  . Not on file  Social History Narrative  . Not on file    FAMILY HISTORY:   Family Status  Relation Name Status  . Father 60,cigarettes Deceased  . Mat Aunt  Deceased  . Annamarie Major  Deceased  . Mother 32 Deceased  . Daughter 27 Alive  . Son 56 Alive  . MGM  Deceased at age 50  . MGF  Deceased at age 54  . PGM  Deceased at age 9  . PGF  Deceased at age 43  . Daughter 13 Alive    ROS:  Review of Systems  Constitutional: Negative.   HENT: Negative.   Eyes: Negative.   Respiratory: Negative.   Cardiovascular: Negative.   Musculoskeletal: Positive for back pain and joint pain (knee pain).  Skin: Negative.      PHYSICAL EXAMINATION:    VITALS:   Vitals:   06/06/18 1013  BP: 136/84  Pulse: 72  SpO2: 95%  Weight: 243 lb (110.2 kg)  Height: 6\' 3"  (1.905 m)   GEN:  The patient appears stated age and is in NAD. HEENT:  Normocephalic, atraumatic.  The mucous membranes are moist. The superficial temporal arteries are without ropiness or tenderness. CV:  RRR Lungs:  CTAB Neck/HEME:  There are no carotid bruits bilaterally.  Neurological examination:  Orientation: The patient is alert and oriented x3. Cranial nerves: There is good facial symmetry. The speech is fluent and clear. Soft palate rises symmetrically and there is no tongue deviation. Hearing is intact to conversational tone. Sensation: Sensation is intact to light touch throughout Motor: Strength is 5/5 in the bilateral upper and lower extremities.   Shoulder shrug is equal and symmetric.  There is no pronator drift.  Movement examination: Tone: There is normal tone in the UE/LE bilaterally Abnormal movements: There is right upper extremity resting tremor Coordination:  There is no decremation with RAM's, with any form of RAMS, including alternating supination and pronation of the forearm, hand opening and closing,  finger taps, heel taps and toe taps. Gait and Station: The patient has no difficulty arising out of a deep-seated chair without the use of the hands. The patient's stride length is normal.      LABS    Chemistry      Component Value Date/Time   NA 139 06/05/2017 0858   K 4.2 06/05/2017 0858   CL 104 06/05/2017 0858   CO2 26 06/05/2017 0858   BUN 22 06/05/2017 0858   CREATININE 1.55 (H) 06/05/2017 0858      Component Value Date/Time   CALCIUM 9.7 06/05/2017 0858   ALKPHOS 72 06/05/2017 0858   AST 20 06/05/2017 0858   ALT 7 06/05/2017 0858   BILITOT 0.8 06/05/2017 0858     Lab Results  Component Value Date   WBC 8.0 06/05/2017   HGB 15.2 06/05/2017   HCT 44.9 06/05/2017   MCV  91.8 06/05/2017   PLT 165.0 06/05/2017   Lab Results  Component Value Date   [id number] 673 11/18/2015     Lab Results  Component Value Date   IRON 71 11/18/2015    Lab Results  Component Value Date   TSH 3.44 06/05/2017     ASSESSMENT/PLAN:  1.  Idiopathic Parkinson's disease, diagnosed August, 2017.     -We discussed the diagnosis as well as pathophysiology of the disease.  We discussed treatment options as well as prognostic indicators.  Patient education was provided.  -Patient will continue carbidopa/levodopa 25/100, 2/1/1.  We may need to change to the CR in the future because of AM fatigue but I made several other changes today and didn't want to change this as well. Risks, benefits, side effects and alternative therapies were discussed.  The opportunity to ask questions was given and they were answered to the best of my ability.  The patient expressed understanding and willingness to follow the outlined treatment protocols.  -continue carbidopa/levodopa 50/200 1 hour prior to bedtime   2.  RLS  -The levodopa has helped 1 hour prior to bed  3.  Depression and anxiety  -Can be associated with Parkinson's disease.    -stop lexapro - doesn't seem to be helping.    -seems to need a  mood stabilizer.  Will start depakote ER- 250 mg daily.  Warned him that could cause action tremor (has just rest tremor now).  May need to increase dosage with time.   Risks, benefits, side effects and alternative therapies were discussed.  The opportunity to ask questions was given and they were answered to the best of my ability.  The patient expressed understanding and willingness to follow the outlined treatment protocols.  -think that he probably needs psychiatry and a counselor.  Information given.  Will think about psychiatry.    4.  RBD and insomnia  -add klonopin - 0.5 mg - 1/2 tablet at night.  Discussed addicted potential.  Risks, benefits, side effects and alternative therapies were discussed.  The opportunity to ask questions was given and they were answered to the best of my ability.  The patient expressed understanding and willingness to follow the outlined treatment protocols.  5.  Much greater than 50% of this visit was spent in counseling and coordinating care.  Total face to face time:  40 min

## 2018-06-05 NOTE — Telephone Encounter (Signed)
Dr.Gupta reviewed records and accepted for patient to be scheduled for a colonoscopy in the [place]. Left message for patient to call back and schedule an appointment.

## 2018-06-06 ENCOUNTER — Telehealth: Payer: Self-pay | Admitting: Neurology

## 2018-06-06 ENCOUNTER — Ambulatory Visit (INDEPENDENT_AMBULATORY_CARE_PROVIDER_SITE_OTHER): Payer: Medicare Other | Admitting: Neurology

## 2018-06-06 ENCOUNTER — Encounter: Payer: Self-pay | Admitting: Neurology

## 2018-06-06 VITALS — BP 136/84 | HR 72 | Ht 75.0 in | Wt 243.0 lb

## 2018-06-06 DIAGNOSIS — G2581 Restless legs syndrome: Secondary | ICD-10-CM | POA: Diagnosis not present

## 2018-06-06 DIAGNOSIS — F32A Depression, unspecified: Secondary | ICD-10-CM

## 2018-06-06 DIAGNOSIS — F329 Major depressive disorder, single episode, unspecified: Secondary | ICD-10-CM | POA: Diagnosis not present

## 2018-06-06 DIAGNOSIS — G20 Parkinson's disease: Secondary | ICD-10-CM

## 2018-06-06 MED ORDER — DIVALPROEX SODIUM ER 250 MG PO TB24: 250.0000 mg | ORAL_TABLET | Freq: Every day | ORAL | 1 refills | Status: DC

## 2018-06-06 MED ORDER — CLONAZEPAM 0.5 MG PO TABS
0.2500 mg | ORAL_TABLET | Freq: Every day | ORAL | 1 refills | Status: DC
Start: 2018-06-06 — End: 2018-10-29

## 2018-06-06 MED FILL — clonazePAM 0.5 MG TABS: 0.5 | 90 days supply | Qty: 45 | Fill #0

## 2018-06-06 MED FILL — DIVALPROEX SOD ER 250 MG TA: 250 | 90 days supply | Qty: 90 | Fill #0

## 2018-06-06 NOTE — Patient Instructions (Addendum)
1. We recommend you see Dr. Clovis Pu for psychiatry. Please call his office to schedule at [phone].  2. We have placed a referral for you to see Myra Gianotti, LCSW for counseling. They should call you with an appointment.   3. Stop Lexapro as follows:  Take 1/2 tablet for two weeks, then stop.   4. Start Depakote 250 mg ER - one tablet daily.   5. Start Clonazepam 0.5 mg - 1/2 tablet at bedtime.

## 2018-06-06 NOTE — Telephone Encounter (Signed)
Referral faxed to Dr. Clovis Pu to fax number [phone] with confirmation received.

## 2018-06-09 ENCOUNTER — Ambulatory Visit (INDEPENDENT_AMBULATORY_CARE_PROVIDER_SITE_OTHER): Payer: Medicare Other | Admitting: *Deleted

## 2018-06-09 ENCOUNTER — Encounter: Payer: Self-pay | Admitting: Gastroenterology

## 2018-06-09 ENCOUNTER — Other Ambulatory Visit (INDEPENDENT_AMBULATORY_CARE_PROVIDER_SITE_OTHER): Payer: Medicare Other

## 2018-06-09 ENCOUNTER — Encounter: Payer: Self-pay | Admitting: *Deleted

## 2018-06-09 VITALS — BP 138/78 | HR 63 | Ht 75.0 in | Wt 241.2 lb

## 2018-06-09 DIAGNOSIS — R7989 Other specified abnormal findings of blood chemistry: Secondary | ICD-10-CM | POA: Diagnosis not present

## 2018-06-09 DIAGNOSIS — R739 Hyperglycemia, unspecified: Secondary | ICD-10-CM

## 2018-06-09 DIAGNOSIS — I10 Essential (primary) hypertension: Secondary | ICD-10-CM

## 2018-06-09 DIAGNOSIS — Z0000 Encounter for general adult medical examination without abnormal findings: Secondary | ICD-10-CM

## 2018-06-09 DIAGNOSIS — E785 Hyperlipidemia, unspecified: Secondary | ICD-10-CM

## 2018-06-09 LAB — LIPID PANEL
CHOL/HDL RATIO: 3
Cholesterol: 117 mg/dL (ref 0–200)
HDL: 45.5 mg/dL (ref >39.00)
LDL Cholesterol: 39 mg/dL (ref 0–99)
NonHDL: 71.48
Triglycerides: 161 mg/dL — ABNORMAL HIGH (ref 0.0–149.0)
VLDL: 32.2 mg/dL (ref 0.0–40.0)

## 2018-06-09 LAB — CBC
HCT: 46.4 % (ref 39.0–52.0)
Hemoglobin: 15.9 g/dL (ref 13.0–17.0)
MCHC: 34.3 g/dL (ref 30.0–36.0)
MCV: 92.6 fl (ref 78.0–100.0)
Platelets: 149 10*3/uL — ABNORMAL LOW (ref 150.0–400.0)
RBC: 5.01 Mil/uL (ref 4.22–5.81)
RDW: 13.6 % (ref 11.5–15.5)
WBC: 6.6 10*3/uL (ref 4.0–10.5)

## 2018-06-09 LAB — COMPREHENSIVE METABOLIC PANEL
ALT: 6 U/L (ref 0–53)
AST: 16 U/L (ref 0–37)
Albumin: 4.5 g/dL (ref 3.5–5.2)
Alkaline Phosphatase: 72 U/L (ref 39–117)
BUN: 24 mg/dL — ABNORMAL HIGH (ref 6–23)
CO2: 27 mEq/L (ref 19–32)
Calcium: 9.7 mg/dL (ref 8.4–10.5)
Chloride: 103 mEq/L (ref 96–112)
Creatinine, Ser: 1.72 mg/dL — ABNORMAL HIGH (ref 0.40–1.50)
GFR: 38.95 mL/min — ABNORMAL LOW (ref >60.00)
Glucose, Bld: 93 mg/dL (ref 70–99)
Potassium: 4.3 mEq/L (ref 3.5–5.1)
Sodium: 139 mEq/L (ref 135–145)
Total Bilirubin: 0.8 mg/dL (ref 0.2–1.2)
Total Protein: 6.5 g/dL (ref 6.0–8.3)

## 2018-06-09 LAB — T4, FREE: Free T4: 0.79 ng/dL (ref 0.60–1.60)

## 2018-06-09 LAB — HEMOGLOBIN A1C: Hgb A1c MFr Bld: 5.8 % (ref 4.6–6.5)

## 2018-06-09 LAB — T3, FREE: T3, Free: 3.4 pg/mL (ref 2.3–4.2)

## 2018-06-09 LAB — TSH: TSH: 3.72 u[IU]/mL (ref 0.35–4.50)

## 2018-06-09 NOTE — Patient Instructions (Signed)
Please schedule your next medicare wellness visit with me in 1 yr.  Continue to eat heart healthy diet (full of fruits, vegetables, whole grains, lean protein, water--limit salt, fat, and sugar intake) and increase physical activity as tolerated.  Continue doing brain stimulating activities (puzzles, reading, adult coloring books, staying active) to keep memory sharp.   Bring a copy of your living will and/or healthcare power of attorney to your next office visit.   Vincent Walker , Thank you for taking time to come for your Medicare Wellness Visit. I appreciate your ongoing commitment to your health goals. Please review the following plan we discussed and let me know if I can assist you in the future.   These are the goals we discussed: Goals    . Patient Stated     Remain physically active        This is a list of the screening recommended for you and due dates:  Health Maintenance  Topic Date Due  .  Hepatitis C: One time screening is recommended by Center for Disease Control  (CDC) for  adults born from [birth year] through 1967.   28-Jun-1945  . Tetanus Vaccine  05/09/1962  . Colon Cancer Screening  02/07/2021  . Flu Shot  Completed  . Pneumonia vaccines  Completed    Health Maintenance After Age 99 After age 49, you are at a higher risk for certain long-term diseases and infections as well as injuries from falls. Falls are a major cause of broken bones and head injuries in people who are older than age 66. Getting regular preventive care can help to keep you healthy and well. Preventive care includes getting regular testing and making lifestyle changes as recommended by your health care provider. Talk with your health care provider about:  Which screenings and tests you should have. A screening is a test that checks for a disease when you have no symptoms.  A diet and exercise plan that is right for you. What should I know about screenings and tests to prevent falls? Screening and  testing are the best ways to find a health problem early. Early diagnosis and treatment give you the best chance of managing medical conditions that are common after age 77. Certain conditions and lifestyle choices may make you more likely to have a fall. Your health care provider may recommend:  Regular vision checks. Poor vision and conditions such as cataracts can make you more likely to have a fall. If you wear glasses, make sure to get your prescription updated if your vision changes.  Medicine review. Work with your health care provider to regularly review all of the medicines you are taking, including over-the-counter medicines. Ask your health care provider about any side effects that may make you more likely to have a fall. Tell your health care provider if any medicines that you take make you feel dizzy or sleepy.  Osteoporosis screening. Osteoporosis is a condition that causes the bones to get weaker. This can make the bones weak and cause them to break more easily.  Blood pressure screening. Blood pressure changes and medicines to control blood pressure can make you feel dizzy.  Strength and balance checks. Your health care provider may recommend certain tests to check your strength and balance while standing, walking, or changing positions.  Foot health exam. Foot pain and numbness, as well as not wearing proper footwear, can make you more likely to have a fall.  Depression screening. You may be more likely to  have a fall if you have a fear of falling, feel emotionally low, or feel unable to do activities that you used to do.  Alcohol use screening. Using too much alcohol can affect your balance and may make you more likely to have a fall. What actions can I take to lower my risk of falls? General instructions  Talk with your health care provider about your risks for falling. Tell your health care provider if: ? You fall. Be sure to tell your health care provider about all falls,  even ones that seem minor. ? You feel dizzy, sleepy, or off-balance.  Take over-the-counter and prescription medicines only as told by your health care provider. These include any supplements.  Eat a healthy diet and maintain a healthy weight. A healthy diet includes low-fat dairy products, low-fat (lean) meats, and fiber from whole grains, beans, and lots of fruits and vegetables. Home safety  Remove any tripping hazards, such as rugs, cords, and clutter.  Install safety equipment such as grab bars in bathrooms and safety rails on stairs.  Keep rooms and walkways well-lit. Activity   Follow a regular exercise program to stay fit. This will help you maintain your balance. Ask your health care provider what types of exercise are appropriate for you.  If you need a cane or walker, use it as recommended by your health care provider.  Wear supportive shoes that have nonskid soles. Lifestyle  Do not drink alcohol if your health care provider tells you not to drink.  If you drink alcohol, limit how much you have: ? 0-1 drink a day for women. ? 0-2 drinks a day for men.  Be aware of how much alcohol is in your drink. In the [place] one drink equals one typical bottle of beer (12 oz), one-half glass of wine (5 oz), or one shot of hard liquor (1 oz).  Do not use any products that contain nicotine or tobacco, such as cigarettes and e-cigarettes. If you need help quitting, ask your health care provider. Summary  Having a healthy lifestyle and getting preventive care can help to protect your health and wellness after age 15.  Screening and testing are the best way to find a health problem early and help you avoid having a fall. Early diagnosis and treatment give you the best chance for managing medical conditions that are more common for people who are older than age 27.  Falls are a major cause of broken bones and head injuries in people who are older than age 81. Take precautions to  prevent a fall at home.  Work with your health care provider to learn what changes you can make to improve your health and wellness and to prevent falls. This information is not intended to replace advice given to you by your health care provider. Make sure you discuss any questions you have with your health care provider. Document Released: 01/30/2017 Document Revised: 01/30/2017 Document Reviewed: 01/30/2017 Elsevier Interactive Patient Education  2019 [employer].

## 2018-06-09 NOTE — Progress Notes (Addendum)
Subjective:   Vincent Walker is a 73 y.o. male who presents for Medicare Annual/Subsequent preventive examination.  Pt enjoys camping with wife and dogs at [facility].  Pt reports he and wife are going to start Parkinson's support counseling.  Review of Systems: No ROS.  Medicare Wellness Visit. Additional risk factors are reflected in the social history. Cardiac Risk Factors include: advanced age (>29men, >48 women);diabetes mellitus;dyslipidemia;hypertension;male gender Sleep patterns:  No sleep issues. Home Safety/Smoke Alarms: Feels safe in home. Smoke alarms in place.  Lives with wife and 2 large dogs in 1 story home. Walk in shower with grab rails.  Male:   CCS-  Last reported 02/2011 w/ 10 yr recall.   PSA- No results found for: PSA      Objective:    Vitals: BP 138/78 (BP Location: Right Arm, Patient Position: Sitting, Cuff Size: Large)   Pulse 63   Ht 6\' 3"  (1.905 m)   Wt 241 lb 3.2 oz (109.4 kg)   SpO2 96%   BMI 30.15 kg/m   Body mass index is 30.15 kg/m.  Advanced Directives 06/09/2018 06/07/2017 09/08/2015  Does Patient Have a Medical Advance Directive? Yes Yes Yes  Type of Paramedic of [place];Living will [place];Living will [place];Living will  Does patient want to make changes to medical advance directive? No - Patient declined No - Patient declined -  Copy of [place] in Chart? No - copy requested No - copy requested -    Tobacco Social History   Tobacco Use  Smoking Status Former Smoker  . Types: Pipe  . Last attempt to quit: 11/18/1995  . Years since quitting: 22.5  Smokeless Tobacco Never Used  Tobacco Comment   stopped 20 years ago.  1996     Counseling given: Not Answered Comment: stopped 20 years ago.  1996   Clinical Intake: Pain : No/denies pain    Past Medical History:  Diagnosis Date  . Anxiety state 12/23/2015  . Benign essential HTN 02/08/2015    . Cancer [facility])    Kidney Cancer  . Chronic kidney disease   . Chronic renal insufficiency 02/14/2015  . Constipation 12/23/2015  . Dyslipidemia 02/08/2015  . H/O renal cell carcinoma 02/08/2015  . History of blood transfusion 2010   After Kidney surgery  . History of chicken pox   . Hyperglycemia 12/23/2015  . Hyperlipidemia   . Hypertension   . Ingrown left big toenail 02/14/2015  . Left foot pain 02/14/2015  . Mitral valve prolapse   . MVP (mitral valve prolapse) 05/14/2016  . Parkinson disease ([place]) 12/23/2015  . Preventative health care 06/21/2016  . Thrombocytopenia ([place]) 02/08/2015  . Tremor of right hand 02/14/2015   Past Surgical History:  Procedure Laterality Date  . APPENDECTOMY  1995  . CATARACT EXTRACTION, BILATERAL    . left knee scope  2003  . right knee scope  1999  . TOE SURGERY    . TONSILLECTOMY    . TOTAL KNEE ARTHROPLASTY Left 07/06/2014  . TOTAL NEPHRECTOMY Right    Family History  Problem Relation Age of Onset  . Alcohol abuse Father   . Hyperlipidemia Father   . Hypertension Father   . Diabetes Father   . Heart disease Father   . Cancer Maternal Aunt        lung cancer  . Cancer Paternal Uncle        bone cancer  . Heart  attack Mother   . Stroke Mother        swelling in brain stem   Social History   Socioeconomic History  . Marital status: Married    Spouse name: Not on file  . Number of children: Not on file  . Years of education: 38  . Highest education level: Not on file  Occupational History  . Occupation: retired Freight forwarder in [place]  . Financial resource strain: Not on file  . Food insecurity:    Worry: Not on file    Inability: Not on file  . Transportation needs:    Medical: Not on file    Non-medical: Not on file  Tobacco Use  . Smoking status: Former Smoker    Types: Pipe    Last attempt to quit: 11/18/1995    Years since quitting: 22.5  . Smokeless tobacco: Never Used  . Tobacco comment: stopped  20 years ago.  1996  Substance and Sexual Activity  . Alcohol use: Yes    Alcohol/week: 0.0 standard drinks    Comment: rare/ social  . Drug use: No  . 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 Physical activity:    Days per week: Not on file    Minutes per session: Not on file  . Stress: Not on file  Relationships  . Social connections:    Talks on phone: Not on file    Gets together: Not on file    Attends religious service: Not on file    Active member of club or organization: Not on file    Attends meetings of clubs or organizations: Not on file    Relationship status: Not on file  Other Topics Concern  . Not on file  Social History Narrative  . Not on file    Outpatient Encounter Medications as of 06/09/2018  Medication Sig  . acetaminophen (TYLENOL) 500 MG tablet Take 500 mg by mouth every 6 (six) hours as needed.  Marland Kitchen amLODipine (NORVASC) 5 MG tablet Take 1 tablet (5 mg total) by mouth daily.  Marland Kitchen aspirin 81 MG tablet Take 81 mg by mouth daily.  Marland Kitchen atenolol (TENORMIN) 100 MG tablet TAKE 1/2 TABLET BY MOUTH DAILY  . atorvastatin (LIPITOR) 20 MG tablet TAKE 1 TABLET BY MOUTH DAILY  . carbidopa-levodopa (SINEMET CR) 50-200 MG tablet TAKE 1 TABLET AT BEDTIME BY MOUTH.  . carbidopa-levodopa (SINEMET IR) 25-100 MG tablet 2 in the morning, 1 in the afternoon, 1 in the evening, 1 extra PRN daily  . clonazePAM (KLONOPIN) 0.5 MG tablet Take 0.5 tablets (0.25 mg total) by mouth at bedtime.  . divalproex (DEPAKOTE ER) 250 MG 24 hr tablet Take 1 tablet (250 mg total) by mouth daily.  Marland Kitchen escitalopram (LEXAPRO) 10 MG tablet TAKE 1 TABLET (10 MG TOTAL) BY MOUTH DAILY.  Marland Kitchen latanoprost (XALATAN) 0.005 % ophthalmic solution PLACE 1 DROP INTO BOTH EYES NIGHTLY.  . Multiple Vitamins-Minerals (CENTRUM SILVER PO) Take by mouth daily.   No facility-administered encounter medications on file as of  06/09/2018.     Activities of Daily Living In your present state of health, do you have any difficulty performing the following activities: 06/09/2018  Hearing? Y  Comment pt states he has been to audiology. Declines going back at this time.   Vision? N  Comment wears readers  Difficulty concentrating or making decisions? N  Walking or climbing stairs? N  Dressing or bathing? N  Doing errands, shopping? N  Preparing Food and eating ? N  Using the Toilet? N  In the past six months, have you accidently leaked urine? N  Do you have problems with loss of bowel control? N  Managing your Medications? N  Managing your Finances? N  Housekeeping or managing your Housekeeping? N  Some recent data might be hidden    Patient Care Team: Mosie Lukes, MD as PCP - General (Family Medicine) Tat, Eustace Quail, DO as Consulting Physician (Neurology)   Assessment:   This is a routine wellness examination for [employer]. Physical assessment deferred to PCP.  Exercise Activities and Dietary recommendations Current Exercise Habits: Home exercise routine, Time (Minutes): 30, Frequency (Times/Week): 7, Weekly Exercise (Minutes/Week): 210, Exercise limited by: None identified   Diet (meal preparation, eat out, water intake, caffeinated beverages, dairy products, fruits and vegetables): well balanced, on average, 3 meals per day Breakfast: eggs and bacon, prune juice, coffee Lunch: sandwich, chips, diet soda or water. Dinner: pork chop, green beans, water.  Goals    . Patient Stated     Remain physically active        Fall Risk Fall Risk  06/09/2018 06/06/2018 02/06/2018 09/02/2017 06/07/2017  Falls in the past year? 1 1 0 Yes Yes  Number falls in past yr: 0 0 0 2 or more 1  Injury with Fall? 0 0 0 No -  Risk Factor Category  - - - High Fall Risk -  Risk for fall due to : Impaired balance/gait - - - -  Follow up - Falls evaluation completed Falls evaluation completed Falls evaluation completed Education  provided;Falls prevention discussed     Depression Screen PHQ 2/9 Scores 06/09/2018 06/07/2017 12/23/2015 08/31/2015  PHQ - 2 Score 0 0 0 0    Cognitive Function MMSE - Mini Mental State Exam 06/09/2018 06/07/2017  Orientation to time 5 5  Orientation to Place 5 5  Registration 3 3  Attention/ Calculation 5 5  Recall 2 2  Language- name 2 objects 2 2  Language- repeat 1 1  Language- follow 3 step command 3 3  Language- read & follow direction 1 1  Write a sentence 1 1  Copy design 1 1  Total score 29 29        Immunization History  Administered Date(s) Administered  . Influenza, High Dose Seasonal PF 12/23/2015, 12/10/2016  . Influenza-Unspecified 02/01/2015    Screening Tests Health Maintenance  Topic Date Due  . Hepatitis C Screening  [birth year]  . TETANUS/TDAP  05/09/1962  . PNA vac Low Risk Adult (2 of 2 - PPSV23) 04/02/2012  . INFLUENZA VACCINE  10/31/2017  . COLONOSCOPY  02/07/2021      Plan:    Please schedule your next medicare wellness visit with me in 1 yr.  Continue to eat heart healthy diet (full of fruits, vegetables, whole grains, lean protein, water--limit salt, fat, and sugar intake) and increase physical activity as tolerated.  Continue doing brain stimulating activities (puzzles, reading, adult coloring books, staying active) to keep memory sharp.   Bring a copy of your living will and/or healthcare power of attorney to your next office visit.    I have personally reviewed and noted the following in the patient's chart:   . Medical and social history . Use of alcohol, tobacco or illicit drugs  . Current medications and supplements . Functional ability and status . Nutritional status .  Physical activity . Advanced directives . List of other physicians . Hospitalizations, surgeries, and ER visits in previous 12 months . Vitals . Screenings to include cognitive, depression, and falls . Referrals and appointments  In addition, I have reviewed  and discussed with patient certain preventive protocols, quality metrics, and best practice recommendations. A written personalized care plan for preventive services as well as general preventive health recommendations were provided to patient.     Shela Nevin, [place]  06/09/2018 Medical screening examination/treatment was performed by qualified clinical staff member and as supervising physician I was immediately available for consultation/collaboration. I have reviewed documentation and agree with assessment and plan.  [name], MD

## 2018-06-09 NOTE — Telephone Encounter (Signed)
PT is sched 07/18/2018 @9 :30am colon, 07/04/2018 @10 :00am pre visit

## 2018-06-12 ENCOUNTER — Other Ambulatory Visit: Payer: Self-pay | Admitting: Family Medicine

## 2018-06-13 ENCOUNTER — Encounter: Payer: Self-pay | Admitting: Family Medicine

## 2018-06-13 ENCOUNTER — Other Ambulatory Visit: Payer: Self-pay

## 2018-06-13 ENCOUNTER — Ambulatory Visit (INDEPENDENT_AMBULATORY_CARE_PROVIDER_SITE_OTHER): Payer: Medicare Other | Admitting: Family Medicine

## 2018-06-13 VITALS — BP 120/78 | HR 75 | Temp 97.8°F | Resp 18 | Ht 75.0 in | Wt 244.4 lb

## 2018-06-13 DIAGNOSIS — Z6830 Body mass index (BMI) 30.0-30.9, adult: Secondary | ICD-10-CM

## 2018-06-13 DIAGNOSIS — R739 Hyperglycemia, unspecified: Secondary | ICD-10-CM

## 2018-06-13 DIAGNOSIS — E669 Obesity, unspecified: Secondary | ICD-10-CM | POA: Diagnosis not present

## 2018-06-13 DIAGNOSIS — I10 Essential (primary) hypertension: Secondary | ICD-10-CM

## 2018-06-13 DIAGNOSIS — N289 Disorder of kidney and ureter, unspecified: Secondary | ICD-10-CM | POA: Diagnosis not present

## 2018-06-13 DIAGNOSIS — E785 Hyperlipidemia, unspecified: Secondary | ICD-10-CM

## 2018-06-13 DIAGNOSIS — D696 Thrombocytopenia, unspecified: Secondary | ICD-10-CM | POA: Diagnosis not present

## 2018-06-13 DIAGNOSIS — N189 Chronic kidney disease, unspecified: Secondary | ICD-10-CM

## 2018-06-13 NOTE — Patient Instructions (Addendum)
Consider 60-80 of fluids daily   Shingrix is the new shingles, 2 shots over 2-6 months, at pharmacy Chronic Kidney Disease, Adult Chronic kidney disease (CKD) occurs when the kidneys become damaged slowly over a long period of time. The kidneys are a pair of organs that do many important jobs in the body, including:  Removing waste and extra fluid from the blood to make urine.  Making hormones that maintain the amount of fluid in tissues and blood vessels.  Maintaining the right amount of fluids and chemicals in the body. A small amount of kidney damage may not cause problems, but a large amount of damage may make it hard or impossible for the kidneys to work the way they should. If steps are not taken to slow down kidney damage or to stop it from getting worse, the kidneys may stop working permanently (end-stage renal disease or ESRD). Most of the time, CKD does not go away, but it can often be controlled. People who have CKD are usually able to live normal lives. What are the causes? The most common causes of this condition are diabetes and high blood pressure (hypertension). Other causes include:  Heart and blood vessel (cardiovascular) disease.  Kidney diseases, such as: ? Glomerulonephritis. ? Interstitial nephritis. ? Polycystic kidney disease. ? Renal vascular disease.  Diseases that affect the immune system.  Genetic diseases.  Medicines that damage the kidneys, such as anti-inflammatory medicines.  Being around or being in contact with poisonous (toxic) substances.  A kidney or urinary infection that occurs again and again (recurs).  Vasculitis. This is swelling or inflammation of the blood vessels.  A problem with urine flow that may be caused by: ? Cancer. ? Having kidney stones more than one time. ? An enlarged prostate, in males. What increases the risk? You are more likely to develop this condition if you:  Are older than age 38.  Are male.  Are  African-American, Hispanic, Asian, [place], or American [place].  Are a current or former smoker.  Are obese.  Have a family history of kidney disease or failure.  Often take medicines that are damaging to the kidneys. What are the signs or symptoms? Symptoms of this condition include:  Swelling (edema) of the face, legs, ankles, or feet.  Tiredness (lethargy) and having less energy.  Nausea or vomiting.  Confusion or trouble concentrating.  Problems with urination, such as: ? Painful or burning feeling during urination. ? Decreased urine production. ? Frequent urination, especially at night. ? Bloody urine.  Muscle twitches and cramps, especially in the legs.  Shortness of breath.  Weakness.  Loss of appetite.  Metallic taste in the mouth.  Trouble sleeping.  Dry, itchy skin.  A low blood count (anemia).  Pale lining of the eyelids and surface of the eye (conjunctiva). Symptoms develop slowly and may not be obvious until the kidney damage becomes severe. It is possible to have kidney disease for years without having any symptoms. How is this diagnosed? This condition may be diagnosed based on:  Blood tests.  Urine tests.  Imaging tests, such as an ultrasound or CT scan.  A test in which a sample of tissue is removed from the kidneys to be examined under a microscope (kidney biopsy). These test results will help your health care provider determine how serious the CKD is. How is this treated? There is no cure for most cases of this condition, but treatment usually relieves symptoms and prevents or slows the progression of  the disease. Treatment may include:  Making diet changes, which may require you to avoid alcohol, salty foods (sodium), and foods that are high in potassium, calcium, and protein.  Medicines: ? To lower blood pressure. ? To control blood glucose. ? To relieve anemia. ? To relieve swelling. ? To protect your bones. ? To improve  the balance of electrolytes in your blood.  Removing toxic waste from the body through types of dialysis, if the kidneys can no longer do their job (kidney failure).  Managing any other conditions that are causing your CKD or making it worse. Follow these instructions at home: Medicines  Take over-the-counter and prescription medicines only as told by your health care provider. The dose of some medicines that you take may need to be adjusted.  Do not take any new medicines unless approved by your health care provider. Many medicines can worsen your kidney damage.  Do not take any vitamin and mineral supplements unless approved by your health care provider. Many nutritional supplements can worsen your kidney damage. General instructions  Follow your prescribed diet as told by your health care provider.  Do not use any products that contain nicotine or tobacco, such as cigarettes and e-cigarettes. If you need help quitting, ask your health care provider.  Monitor and track your blood pressure at home. Report changes in your blood pressure as told by your health care provider.  If you are being treated for diabetes, monitor and track your blood sugar (blood glucose) levels as told by your health care provider.  Maintain a healthy weight. If you need help with this, ask your health care provider.  Start or continue an exercise plan. Exercise at least 30 minutes a day, 5 days a week.  Keep your immunizations up to date as told by your health care provider.  Keep all follow-up visits as told by your health care provider. This is important. Where to find more information  American Association of Kidney Patients: BombTimer.gl  National Kidney Foundation: www.kidney.[place]: https://mathis.com/  Life Options Rehabilitation Program: www.lifeoptions.org and www.kidneyschool.org Contact a health care provider if:  Your symptoms get worse.  You develop new symptoms. Get  help right away if:  You develop symptoms of ESRD, which include: ? Headaches. ? Numbness in the hands or feet. ? Easy bruising. ? Frequent hiccups. ? Chest pain. ? Shortness of breath. ? Lack of menstruation, in women.  You have a fever.  You have decreased urine production.  You have pain or bleeding when you urinate. Summary  Chronic kidney disease (CKD) occurs when the kidneys become damaged slowly over a long period of time.  The most common causes of this condition are diabetes and high blood pressure (hypertension).  There is no cure for most cases of this condition, but treatment usually relieves symptoms and prevents or slows the progression of the disease. Treatment may include a combination of medicines and lifestyle changes. This information is not intended to replace advice given to you by your health care provider. Make sure you discuss any questions you have with your health care provider. Document Released: 12/27/2007 Document Revised: 04/26/2016 Document Reviewed: 04/26/2016 Elsevier Interactive Patient Education  2019 [employer].

## 2018-06-13 NOTE — Assessment & Plan Note (Signed)
Well controlled, no changes to meds. Encouraged heart healthy diet such as the DASH diet and exercise as tolerated.  °

## 2018-06-13 NOTE — Assessment & Plan Note (Signed)
Check cmp in 4-6 weeks

## 2018-06-13 NOTE — Assessment & Plan Note (Signed)
Encouraged heart healthy diet, increase exercise, avoid trans fats, consider a krill oil cap daily 

## 2018-06-13 NOTE — Assessment & Plan Note (Signed)
Check a cbc regularly

## 2018-06-15 DIAGNOSIS — E669 Obesity, unspecified: Secondary | ICD-10-CM | POA: Insufficient documentation

## 2018-06-15 NOTE — Assessment & Plan Note (Signed)
hgba1c acceptable, minimize simple carbs. Increase exercise as tolerated. Continue current meds 

## 2018-06-15 NOTE — Assessment & Plan Note (Signed)
Encouraged DASH diet, decrease po intake and increase exercise as tolerated. Needs 7-8 hours of sleep nightly. Avoid trans fats, eat small, frequent meals every 4-5 hours with lean proteins, complex carbs and healthy fats. Minimize simple carbs, he has been eating more protein and decreased carbs and feels better.

## 2018-06-15 NOTE — Progress Notes (Signed)
Subjective:    Patient ID: Vincent Walker, male    DOB: 23-Jul-1945, 73 y.o.   MRN: [id number]  Chief Complaint  Patient presents with  . Kidney Function    Pt states f/u on labs    HPI Patient is in today for follow up. No recent febrile illness or hospitalizations. He has been eating better with more healthy proteins and less carbohydrates. No polyuria or polydipsia. Denies CP/palp/SOB/HA/congestion/fevers/GI or GU c/o. Taking meds as prescribed  Past Medical History:  Diagnosis Date  . Anxiety state 12/23/2015  . Benign essential HTN 02/08/2015  . Cancer [facility])    Kidney Cancer  . Chronic kidney disease   . Chronic renal insufficiency 02/14/2015  . Constipation 12/23/2015  . Dyslipidemia 02/08/2015  . H/O renal cell carcinoma 02/08/2015  . History of blood transfusion 2010   After Kidney surgery  . History of chicken pox   . Hyperglycemia 12/23/2015  . Hyperlipidemia   . Hypertension   . Ingrown left big toenail 02/14/2015  . Left foot pain 02/14/2015  . Mitral valve prolapse   . MVP (mitral valve prolapse) 05/14/2016  . Parkinson disease ([place]) 12/23/2015  . Preventative health care 06/21/2016  . Thrombocytopenia ([place]) 02/08/2015  . Tremor of right hand 02/14/2015    Past Surgical History:  Procedure Laterality Date  . APPENDECTOMY  1995  . CATARACT EXTRACTION, BILATERAL    . left knee scope  2003  . right knee scope  1999  . TOE SURGERY    . TONSILLECTOMY    . TOTAL KNEE ARTHROPLASTY Left 07/06/2014  . TOTAL NEPHRECTOMY Right     Family History  Problem Relation Age of Onset  . Alcohol abuse Father   . Hyperlipidemia Father   . Hypertension Father   . Diabetes Father   . Heart disease Father   . Cancer Maternal Aunt        lung cancer  . Cancer Paternal Uncle        bone cancer  . Heart attack Mother   . Stroke Mother        swelling in brain stem    Social History   Socioeconomic History  . Marital status: Married    Spouse name: Not on file  . Number  of children: Not on file  . Years of education: 40  . Highest education level: Not on file  Occupational History  . 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 Financial resource strain: Not on file  . Food insecurity:    Worry: Not on file    Inability: Not on file  . Transportation needs:    Medical: Not on file    Non-medical: Not on file  Tobacco Use  . Smoking status: Former Smoker    Types: Pipe    Last attempt to quit: 11/18/1995    Years since quitting: 22.5  . Smokeless tobacco: Never Used  . Tobacco comment: stopped 20 years ago.  1996  Substance and Sexual Activity  . Alcohol use: Yes    Alcohol/week: 0.0 standard drinks    Comment: rare/ social  . Drug use: No  . 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 Physical activity:    Days per week: Not on file    Minutes per session: Not on file  . Stress: Not on file  Relationships  . Social  connections:    Talks on phone: Not on file    Gets together: Not on file    Attends religious service: Not on file    Active member of club or organization: Not on file    Attends meetings of clubs or organizations: Not on file    Relationship status: Not on file  . Intimate partner violence:    Fear of current or ex partner: Not on file    Emotionally abused: Not on file    Physically abused: Not on file    Forced sexual activity: Not on file  Other Topics Concern  . Not on file  Social History Narrative  . Not on file    Outpatient Medications Prior to Visit  Medication Sig Dispense Refill  . acetaminophen (TYLENOL) 500 MG tablet Take 500 mg by mouth every 6 (six) hours as needed.    Marland Kitchen amLODipine (NORVASC) 5 MG tablet Take 1 tablet (5 mg total) by mouth daily. 30 tablet 5  . aspirin 81 MG tablet Take 81 mg by mouth daily.    Marland Kitchen atenolol (TENORMIN) 100 MG tablet TAKE 1/2 TABLET BY MOUTH DAILY 45  tablet 2  . atorvastatin (LIPITOR) 20 MG tablet TAKE 1 TABLET BY MOUTH DAILY 90 tablet 1  . carbidopa-levodopa (SINEMET CR) 50-200 MG tablet TAKE 1 TABLET AT BEDTIME BY MOUTH. 90 tablet 1  . carbidopa-levodopa (SINEMET IR) 25-100 MG tablet 2 in the morning, 1 in the afternoon, 1 in the evening, 1 extra PRN daily 450 tablet 1  . clonazePAM (KLONOPIN) 0.5 MG tablet Take 0.5 tablets (0.25 mg total) by mouth at bedtime. 45 tablet 1  . divalproex (DEPAKOTE ER) 250 MG 24 hr tablet Take 1 tablet (250 mg total) by mouth daily. 90 tablet 1  . escitalopram (LEXAPRO) 10 MG tablet TAKE 1 TABLET (10 MG TOTAL) BY MOUTH DAILY. 90 tablet 1  . latanoprost (XALATAN) 0.005 % ophthalmic solution PLACE 1 DROP INTO BOTH EYES NIGHTLY.    . Multiple Vitamins-Minerals (CENTRUM SILVER PO) Take by mouth daily.     No facility-administered medications prior to visit.     Allergies  Allergen Reactions  . Nsaids   . Sulfa Antibiotics Other (See Comments)    Had a reaction as a child  . Tolmetin Other (See Comments)    Review of Systems  Constitutional: Negative for fever and malaise/fatigue.  HENT: Negative for congestion.   Eyes: Negative for blurred vision.  Respiratory: Negative for shortness of breath.   Cardiovascular: Negative for chest pain, palpitations and leg swelling.  Gastrointestinal: Negative for abdominal pain, blood in stool and nausea.  Genitourinary: Negative for dysuria and frequency.  Musculoskeletal: Negative for falls.  Skin: Negative for rash.  Neurological: Negative for dizziness, loss of consciousness and headaches.  Endo/Heme/Allergies: Negative for environmental allergies.  Psychiatric/Behavioral: Negative for depression. The patient is not nervous/anxious.        Objective:    Physical Exam Vitals signs and nursing note reviewed.  Constitutional:      General: He is not in acute distress.    Appearance: He is well-developed.  HENT:     Head: Normocephalic and atraumatic.      Nose: Nose normal.  Eyes:     General:        Right eye: No discharge.        Left eye: No discharge.  Neck:     Musculoskeletal: Normal range of motion and neck supple.  Cardiovascular:  Rate and Rhythm: Normal rate and regular rhythm.     Heart sounds: No murmur.  Pulmonary:     Effort: Pulmonary effort is normal.     Breath sounds: Normal breath sounds.  Abdominal:     General: Bowel sounds are normal.     Palpations: Abdomen is soft.     Tenderness: There is no abdominal tenderness.  Skin:    General: Skin is warm and dry.  Neurological:     Mental Status: He is alert and oriented to person, place, and time.     BP 120/78 (BP Location: Left Arm, Patient Position: Sitting, Cuff Size: Normal)   Pulse 75   Temp 97.8 F (36.6 C) (Oral)   Resp 18   Ht 6\' 3"  (1.905 m)   Wt 244 lb 6.4 oz (110.9 kg)   SpO2 97%   BMI 30.55 kg/m  Wt Readings from Last 3 Encounters:  06/13/18 244 lb 6.4 oz (110.9 kg)  06/09/18 241 lb 3.2 oz (109.4 kg)  06/06/18 243 lb (110.2 kg)     Lab Results  Component Value Date   WBC 6.6 06/09/2018   HGB 15.9 06/09/2018   HCT 46.4 06/09/2018   PLT 149.0 (L) 06/09/2018   GLUCOSE 93 06/09/2018   CHOL 117 06/09/2018   TRIG 161.0 (H) 06/09/2018   HDL 45.50 06/09/2018   LDLCALC 39 06/09/2018   ALT 6 06/09/2018   AST 16 06/09/2018   NA 139 06/09/2018   K 4.3 06/09/2018   CL 103 06/09/2018   CREATININE 1.72 (H) 06/09/2018   BUN 24 (H) 06/09/2018   CO2 27 06/09/2018   TSH 3.72 06/09/2018   HGBA1C 5.8 06/09/2018    Lab Results  Component Value Date   TSH 3.72 06/09/2018   Lab Results  Component Value Date   WBC 6.6 06/09/2018   HGB 15.9 06/09/2018   HCT 46.4 06/09/2018   MCV 92.6 06/09/2018   PLT 149.0 (L) 06/09/2018   Lab Results  Component Value Date   NA 139 06/09/2018   K 4.3 06/09/2018   CO2 27 06/09/2018   GLUCOSE 93 06/09/2018   BUN 24 (H) 06/09/2018   CREATININE 1.72 (H) 06/09/2018   BILITOT 0.8 06/09/2018    ALKPHOS 72 06/09/2018   AST 16 06/09/2018   ALT 6 06/09/2018   PROT 6.5 06/09/2018   ALBUMIN 4.5 06/09/2018   CALCIUM 9.7 06/09/2018   GFR 38.95 (L) 06/09/2018   Lab Results  Component Value Date   CHOL 117 06/09/2018   Lab Results  Component Value Date   HDL 45.50 06/09/2018   Lab Results  Component Value Date   LDLCALC 39 06/09/2018   Lab Results  Component Value Date   TRIG 161.0 (H) 06/09/2018   Lab Results  Component Value Date   CHOLHDL 3 06/09/2018   Lab Results  Component Value Date   HGBA1C 5.8 06/09/2018       Assessment & Plan:   Problem List Items Addressed This Visit    Benign essential HTN    Well controlled, no changes to meds. Encouraged heart healthy diet such as the DASH diet and exercise as tolerated.       Relevant Orders   CBC   TSH   Comprehensive metabolic panel   Dyslipidemia    Encouraged heart healthy diet, increase exercise, avoid trans fats, consider a krill oil cap daily      Relevant Orders   Lipid panel   Thrombocytopenia ([place])  Check a cbc regularly      Relevant Orders   TSH   Chronic renal insufficiency    Check cmp in 4-6 weeks      Hyperglycemia    hgba1c acceptable, minimize simple carbs. Increase exercise as tolerated. Continue current meds      Relevant Orders   Hemoglobin A1c   TSH   Obesity    Encouraged DASH diet, decrease po intake and increase exercise as tolerated. Needs 7-8 hours of sleep nightly. Avoid trans fats, eat small, frequent meals every 4-5 hours with lean proteins, complex carbs and healthy fats. Minimize simple carbs, he has been eating more protein and decreased carbs and feels better.       Other Visit Diagnoses    Renal insufficiency    -  Primary   Relevant Orders   Comprehensive metabolic panel      I am having Vincent Walker maintain his aspirin, Multiple Vitamins-Minerals (CENTRUM SILVER PO), latanoprost, amLODipine, carbidopa-levodopa, atenolol, carbidopa-levodopa,  escitalopram, acetaminophen, divalproex, clonazePAM, and atorvastatin.  No orders of the defined types were placed in this encounter.    [name], MD

## 2018-06-17 ENCOUNTER — Other Ambulatory Visit: Payer: Self-pay | Admitting: Neurology

## 2018-06-17 ENCOUNTER — Other Ambulatory Visit: Payer: Self-pay | Admitting: Family Medicine

## 2018-06-17 DIAGNOSIS — I10 Essential (primary) hypertension: Secondary | ICD-10-CM

## 2018-06-17 MED FILL — CARBIDOPA/LEVO 25/100 TAB: 25-100 | 90 days supply | Qty: 450 | Fill #0

## 2018-06-18 MED FILL — AMLODIPINE BESYLATE 5 MG TA: 5 | 30 days supply | Qty: 30 | Fill #0

## 2018-06-26 ENCOUNTER — Telehealth: Payer: Self-pay | Admitting: *Deleted

## 2018-06-26 NOTE — Telephone Encounter (Signed)
Received Immunizations Record from [facility]; forwarded to provider/SLS 03/26

## 2018-07-07 ENCOUNTER — Other Ambulatory Visit: Payer: Self-pay

## 2018-07-07 ENCOUNTER — Ambulatory Visit (INDEPENDENT_AMBULATORY_CARE_PROVIDER_SITE_OTHER): Payer: Medicare Other | Admitting: Family Medicine

## 2018-07-07 ENCOUNTER — Ambulatory Visit: Payer: Self-pay

## 2018-07-07 DIAGNOSIS — T7840XD Allergy, unspecified, subsequent encounter: Secondary | ICD-10-CM | POA: Diagnosis not present

## 2018-07-07 DIAGNOSIS — T7840XA Allergy, unspecified, initial encounter: Secondary | ICD-10-CM | POA: Insufficient documentation

## 2018-07-07 DIAGNOSIS — R040 Epistaxis: Secondary | ICD-10-CM

## 2018-07-07 DIAGNOSIS — G20 Parkinson's disease: Secondary | ICD-10-CM

## 2018-07-07 DIAGNOSIS — I10 Essential (primary) hypertension: Secondary | ICD-10-CM

## 2018-07-07 HISTORY — DX: Epistaxis: R04.0

## 2018-07-07 HISTORY — DX: Allergy, unspecified, initial encounter: T78.40XA

## 2018-07-07 MED ORDER — SALINE SPRAY 0.65 % NA SOLN: 1.0000 | NASAL | 2 refills | Status: DC | PRN

## 2018-07-07 MED ORDER — CETIRIZINE HCL 10 MG PO TABS: 10.0000 mg | ORAL_TABLET | Freq: Two times a day (BID) | ORAL | 2 refills | Status: DC | PRN

## 2018-07-07 MED ORDER — MUPIROCIN 2 % EX OINT: 1.0000 "application " | TOPICAL_OINTMENT | Freq: Every day | CUTANEOUS | 0 refills | Status: DC

## 2018-07-07 MED ORDER — FLUTICASONE PROPIONATE 50 MCG/ACT NA SUSP: 2.0000 | Freq: Every day | NASAL | 6 refills | Status: DC

## 2018-07-07 MED ORDER — AMLODIPINE BESYLATE 5 MG PO TABS: 2.5000 mg | ORAL_TABLET | Freq: Two times a day (BID) | ORAL | 5 refills | Status: DC

## 2018-07-07 NOTE — Assessment & Plan Note (Signed)
Had never had a nosebleed before and had a brisk one this early morning. Likely flared by pollen and yard work. Given Ayr to use prn and Mupirocin ointment in nares qhs for a week. Report worsening symptoms

## 2018-07-07 NOTE — Assessment & Plan Note (Signed)
Encouraged Zyrtec twice daily x 7 days and then drop to 1 x daily prn. Flonase daily.

## 2018-07-07 NOTE — Progress Notes (Signed)
Virtual Visit via Video Note  I connected with Vincent Walker on 07/07/18 at 10:40 AM EDT by a video enabled telemedicine application and verified that I am speaking with the correct person using two identifiers.   I discussed the limitations of evaluation and management by telemedicine and the availability of in person appointments. The patient expressed understanding and agreed to proceed. Magdalene Molly, CMA was able to get the patient on the video visit. His wife was accompanying him    Subjective:    Patient ID: Vincent Walker, male    DOB: 02-08-1946, 73 y.o.   MRN: [id number]  No chief complaint on file.   HPI Patient is in today for evaluation of elevated blood pressure, allergies and epistaxis. 3 days ago he felt well. Then yesterday he worked hard in his yard and then noted his blood pressure was elevated at 160/100 last night. He felt a little weak and shakier than he usually does with his Parkinson's Disease. Overnight he woke up with a brisk nosebleed that worried him. He was eventually abel to stop it with pressure ad cool clothes. This am he took an extra dose of Amlodipine of 2.5 mg in am and his pressure was some lower. Denies CP/palp/SOB/HA/fevers/GI or GU c/o. Taking meds as prescribed  Past Medical History:  Diagnosis Date  . Anxiety state 12/23/2015  . Benign essential HTN 02/08/2015  . Cancer [facility])    Kidney Cancer  . Chronic kidney disease   . Chronic renal insufficiency 02/14/2015  . Constipation 12/23/2015  . Dyslipidemia 02/08/2015  . H/O renal cell carcinoma 02/08/2015  . History of blood transfusion 2010   After Kidney surgery  . History of chicken pox   . Hyperglycemia 12/23/2015  . Hyperlipidemia   . Hypertension   . Ingrown left big toenail 02/14/2015  . Left foot pain 02/14/2015  . Mitral valve prolapse   . MVP (mitral valve prolapse) 05/14/2016  . Parkinson disease ([place]) 12/23/2015  . Preventative health care 06/21/2016  . Thrombocytopenia ([place]) 02/08/2015   . Tremor of right hand 02/14/2015    Past Surgical History:  Procedure Laterality Date  . APPENDECTOMY  1995  . CATARACT EXTRACTION, BILATERAL    . left knee scope  2003  . right knee scope  1999  . TOE SURGERY    . TONSILLECTOMY    . TOTAL KNEE ARTHROPLASTY Left 07/06/2014  . TOTAL NEPHRECTOMY Right     Family History  Problem Relation Age of Onset  . Alcohol abuse Father   . Hyperlipidemia Father   . Hypertension Father   . Diabetes Father   . Heart disease Father   . Cancer Maternal Aunt        lung cancer  . Cancer Paternal Uncle        bone cancer  . Heart attack Mother   . Stroke Mother        swelling in brain stem    Social History   Socioeconomic History  . Marital status: Married    Spouse name: Not on file  . Number of children: Not on file  . Years of education: 69  . Highest education level: Not on file  Occupational History  . 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 Financial resource strain: Not on file  . Food insecurity:    Worry: Not on file    Inability: Not on file  . Transportation needs:    Medical: Not on file  Non-medical: Not on file  Tobacco Use  . Smoking status: Former Smoker    Types: Pipe    Last attempt to quit: 11/18/1995    Years since quitting: 22.6  . Smokeless tobacco: Never Used  . Tobacco comment: stopped 20 years ago.  1996  Substance and Sexual Activity  . Alcohol use: Yes    Alcohol/week: 0.0 standard drinks    Comment: rare/ social  . Drug use: No  . 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 Physical activity:    Days per week: Not on file    Minutes per session: Not on file  . Stress: Not on file  Relationships  . Social connections:    Talks on phone: Not on file    Gets together: Not on file    Attends religious service: Not on file    Active member of club or  organization: Not on file    Attends meetings of clubs or organizations: Not on file    Relationship status: Not on file  . Intimate partner violence:    Fear of current or ex partner: Not on file    Emotionally abused: Not on file    Physically abused: Not on file    Forced sexual activity: Not on file  Other Topics Concern  . Not on file  Social History Narrative  . Not on file    Outpatient Medications Prior to Visit  Medication Sig Dispense Refill  . acetaminophen (TYLENOL) 500 MG tablet Take 500 mg by mouth every 6 (six) hours as needed.    Marland Kitchen aspirin 81 MG tablet Take 81 mg by mouth daily.    Marland Kitchen atenolol (TENORMIN) 100 MG tablet TAKE 1/2 TABLET BY MOUTH DAILY 45 tablet 2  . atorvastatin (LIPITOR) 20 MG tablet TAKE 1 TABLET BY MOUTH DAILY 90 tablet 1  . carbidopa-levodopa (SINEMET CR) 50-200 MG tablet TAKE 1 TABLET AT BEDTIME BY MOUTH. 90 tablet 1  . carbidopa-levodopa (SINEMET IR) 25-100 MG tablet TAKE 2 TABLETS PO IN THE MORNING, 1 TABLET IN THE AFTERNOON, 1 TABLET IN THE EVENING,  1 EXTRA TABLET PRN 450 tablet 1  . clonazePAM (KLONOPIN) 0.5 MG tablet Take 0.5 tablets (0.25 mg total) by mouth at bedtime. 45 tablet 1  . divalproex (DEPAKOTE ER) 250 MG 24 hr tablet Take 1 tablet (250 mg total) by mouth daily. 90 tablet 1  . escitalopram (LEXAPRO) 10 MG tablet TAKE 1 TABLET (10 MG TOTAL) BY MOUTH DAILY. 90 tablet 1  . latanoprost (XALATAN) 0.005 % ophthalmic solution PLACE 1 DROP INTO BOTH EYES NIGHTLY.    . Multiple Vitamins-Minerals (CENTRUM SILVER PO) Take by mouth daily.    Marland Kitchen amLODipine (NORVASC) 5 MG tablet TAKE 1 TABLET BY MOUTH DAILY 30 tablet 5   No facility-administered medications prior to visit.     Allergies  Allergen Reactions  . Nsaids   . Sulfa Antibiotics Other (See Comments)    Had a reaction as a child  . Tolmetin Other (See Comments)    Review of Systems  Constitutional: Positive for malaise/fatigue. Negative for chills and fever.  HENT: Positive for  congestion, ear discharge and nosebleeds.   Eyes: Negative for blurred vision.  Respiratory: Negative for shortness of breath.   Cardiovascular: Negative for chest pain, palpitations and leg swelling.  Gastrointestinal: Negative for abdominal pain, blood in stool and nausea.  Genitourinary: Negative  for dysuria and frequency.  Musculoskeletal: Negative for falls.  Skin: Negative for rash.  Neurological: Positive for dizziness. Negative for loss of consciousness and headaches.  Endo/Heme/Allergies: Negative for environmental allergies.  Psychiatric/Behavioral: Negative for depression. The patient is not nervous/anxious.        Objective:    Physical Exam  BP (!) 148/80 (BP Location: Left Arm, Patient Position: Sitting, Cuff Size: Normal)   Wt 238 lb (108 kg)   BMI 29.75 kg/m  Wt Readings from Last 3 Encounters:  07/07/18 238 lb (108 kg)  06/13/18 244 lb 6.4 oz (110.9 kg)  06/09/18 241 lb 3.2 oz (109.4 kg)    Diabetic Foot Exam - Simple   No data filed     Lab Results  Component Value Date   WBC 6.6 06/09/2018   HGB 15.9 06/09/2018   HCT 46.4 06/09/2018   PLT 149.0 (L) 06/09/2018   GLUCOSE 93 06/09/2018   CHOL 117 06/09/2018   TRIG 161.0 (H) 06/09/2018   HDL 45.50 06/09/2018   LDLCALC 39 06/09/2018   ALT 6 06/09/2018   AST 16 06/09/2018   NA 139 06/09/2018   K 4.3 06/09/2018   CL 103 06/09/2018   CREATININE 1.72 (H) 06/09/2018   BUN 24 (H) 06/09/2018   CO2 27 06/09/2018   TSH 3.72 06/09/2018   HGBA1C 5.8 06/09/2018    Lab Results  Component Value Date   TSH 3.72 06/09/2018   Lab Results  Component Value Date   WBC 6.6 06/09/2018   HGB 15.9 06/09/2018   HCT 46.4 06/09/2018   MCV 92.6 06/09/2018   PLT 149.0 (L) 06/09/2018   Lab Results  Component Value Date   NA 139 06/09/2018   K 4.3 06/09/2018   CO2 27 06/09/2018   GLUCOSE 93 06/09/2018   BUN 24 (H) 06/09/2018   CREATININE 1.72 (H) 06/09/2018   BILITOT 0.8 06/09/2018   ALKPHOS 72 06/09/2018    AST 16 06/09/2018   ALT 6 06/09/2018   PROT 6.5 06/09/2018   ALBUMIN 4.5 06/09/2018   CALCIUM 9.7 06/09/2018   GFR 38.95 (L) 06/09/2018   Lab Results  Component Value Date   CHOL 117 06/09/2018   Lab Results  Component Value Date   HDL 45.50 06/09/2018   Lab Results  Component Value Date   LDLCALC 39 06/09/2018   Lab Results  Component Value Date   TRIG 161.0 (H) 06/09/2018   Lab Results  Component Value Date   CHOLHDL 3 06/09/2018   Lab Results  Component Value Date   HGBA1C 5.8 06/09/2018       Assessment & Plan:   Problem List Items Addressed This Visit    Benign essential HTN    Had been well controlled until yesterday per patient. He checked it yesterday after working in the yard all day and noted it was elevated at 160/100. This am he retook it and it was 148/80. He had been taking his Amlodipine at just 2.5 mg qhs because he was having some trouble with low blood pressures so he dropped his dose. He is instructed to take the Amlodipine 2.5 mg twice daily for the next month and then from there will see how he is doing and consider continuing this dose.       Relevant Medications   amLODipine (NORVASC) 5 MG tablet   Parkinson disease ([place])    Notes he feels weaker since working in the yard yesterday, will need improved hydration.  Epistaxis    Had never had a nosebleed before and had a brisk one this early morning. Likely flared by pollen and yard work. Given Ayr to use prn and Mupirocin ointment in nares qhs for a week. Report worsening symptoms      Allergies    Encouraged Zyrtec twice daily x 7 days and then drop to 1 x daily prn. Flonase daily.       Other Visit Diagnoses    Essential hypertension       Relevant Medications   amLODipine (NORVASC) 5 MG tablet      I have changed Damonte S. Dellis's amLODipine. I am also having him start on mupirocin ointment, sodium chloride, fluticasone, and cetirizine. Additionally, I am having him maintain  his aspirin, Multiple Vitamins-Minerals (CENTRUM SILVER PO), latanoprost, atenolol, carbidopa-levodopa, escitalopram, acetaminophen, divalproex, clonazePAM, atorvastatin, and carbidopa-levodopa.  Meds ordered this encounter  Medications  . mupirocin ointment (BACTROBAN) 2 %    Sig: Place 1 application into the nose at bedtime.    Dispense:  22 g    Refill:  0  . sodium chloride (OCEAN) 0.65 % SOLN nasal spray    Sig: Place 1 spray into both nostrils as needed for congestion.    Dispense:  88 mL    Refill:  2  . fluticasone (FLONASE) 50 MCG/ACT nasal spray    Sig: Place 2 sprays into both nostrils daily.    Dispense:  16 g    Refill:  6  . cetirizine (ZYRTEC) 10 MG tablet    Sig: Take 1 tablet (10 mg total) by mouth 2 (two) times daily as needed for allergies.    Dispense:  30 tablet    Refill:  2  . amLODipine (NORVASC) 5 MG tablet    Sig: Take 0.5 tablets (2.5 mg total) by mouth 2 (two) times daily.    Dispense:  30 tablet    Refill:  5     [name], MD    I discussed the assessment and treatment plan with the patient. The patient was provided an opportunity to ask questions and all were answered. The patient agreed with the plan and demonstrated an understanding of the instructions.   The patient was advised to call back or seek an in-person evaluation if the symptoms worsen or if the condition fails to improve as anticipated.  I provided 22 minutes of non-face-to-face time during this encounter.   [name], MD

## 2018-07-07 NOTE — Assessment & Plan Note (Signed)
Notes he feels weaker since working in the yard yesterday, will need improved hydration.

## 2018-07-07 NOTE — Telephone Encounter (Signed)
Pt's wife called to say her husband had a severe nose bleed last night.  She said he woke from sleep with his nose bleeding.. She states that he never has ever had a nose bleed. After 15 minutes she was able to get it stopped. She said she decided to take his BP it was 160/100 and his HR was irregular.  She states that he has a frequent irregular heart beat that had been diagnosed He takes Tenormin. His nose has not bleed anymore since that instance.  His BP is now 140/80. HR is still irregular. He take a baby ASA every morning and he bruises easily He has Hx of mitral valve prolapes and parkinsons Dx. Per protocol office was notified for scheduling. Care advice was read to pt wife.  She verbalized understanding.  Reason for Disposition . [1] Large amount of blood has been lost (e.g., 1 cup or 240 ml) AND [2] bleeding now controlled (stopped)  Answer Assessment - Initial Assessment Questions 1. AMOUNT OF BLEEDING: "How bad is the bleeding?" "How much blood was lost?" "Has the bleeding stopped?"   - MILD: needed a couple tissues   - MODERATE: needed many tissues   - SEVERE: large blood clots, soaked many tissues, lasted more than 30 minutes  severe 2. ONSET: "When did the nosebleed start?"      0345 am 3. FREQUENCY: "How many nosebleeds have you had in the last 24 hours?"      one 4. RECURRENT SYMPTOMS: "Have there been other recent nosebleeds?" If so, ask: "How long did it take you to stop the bleeding?" "What worked best?"      Not recurrent symptom 5. CAUSE: "What do you think caused this nosebleed?"    unsure 6. LOCAL FACTORS: "Do you have any cold symptoms?", "Have you been rubbing or picking at your nose?"     Unsure was sleeping 7. SYSTEMIC FACTORS: "Do you have high blood pressure or any bleeding problems?"     BP was high 160/100 8. BLOOD THINNERS: "Do you take any blood thinners?" (e.g., coumadin, heparin, aspirin, Plavix)     Baby asa 9. OTHER SYMPTOMS: "Do you have any other  symptoms?" (e.g., lightheadedness)     no 10. PREGNANCY: "Is there any chance you are pregnant?" "When was your last menstrual period?"     No  Protocols used: NOSEBLEED-A-AH

## 2018-07-07 NOTE — Assessment & Plan Note (Signed)
Had been well controlled until yesterday per patient. He checked it yesterday after working in the yard all day and noted it was elevated at 160/100. This am he retook it and it was 148/80. He had been taking his Amlodipine at just 2.5 mg qhs because he was having some trouble with low blood pressures so he dropped his dose. He is instructed to take the Amlodipine 2.5 mg twice daily for the next month and then from there will see how he is doing and consider continuing this dose.

## 2018-07-08 NOTE — Telephone Encounter (Signed)
Patient scheduled virtual visit

## 2018-07-09 ENCOUNTER — Other Ambulatory Visit: Payer: Self-pay

## 2018-07-09 ENCOUNTER — Telehealth: Payer: Self-pay | Admitting: *Deleted

## 2018-07-09 ENCOUNTER — Encounter (HOSPITAL_COMMUNITY): Payer: Self-pay

## 2018-07-09 ENCOUNTER — Ambulatory Visit: Payer: Medicare Other | Admitting: Neurology

## 2018-07-09 ENCOUNTER — Ambulatory Visit (HOSPITAL_COMMUNITY)
Admission: EM | Admit: 2018-07-09 | Discharge: 2018-07-09 | Disposition: A | Discharge status: Home / Self Care | Payer: Medicare Other | Attending: Family Medicine | Admitting: Family Medicine

## 2018-07-09 DIAGNOSIS — R040 Epistaxis: Secondary | ICD-10-CM | POA: Diagnosis not present

## 2018-07-09 MED ORDER — OXYMETAZOLINE HCL 0.05 % NA SOLN: 1.0000 | Freq: Two times a day (BID) | NASAL | 0 refills | Status: DC

## 2018-07-09 NOTE — ED Provider Notes (Signed)
[place]   [id number] 07/09/18 Arrival Time: 85  CC:EAR PAIN  SUBJECTIVE: History from: patient and family.  Vincent Walker is a 73 y.o. male hx significant for anciety, HTN, H/O kidney cancer, chronic kidney disease, chronic renal insufficieny, dyslipidemia, H/O blood transfusion, HLD, MVP, and parkinsons, who presents with 2 episodes of nosebleeds that began 3 days ago.  States one episode began 3 days ago and lasted approximately 30 minutes.  He had another episode this morning.  Denies a precipitating event, or trauma.  Takes aspirin 81 mg daily.  Patient has tried cold rag with relief and occluding nostrils with relief.  Called PCP and a referral for ENT was made.  Mentions blood in the back of his throat.  Denies similar symptoms in the past.    Denies fever, chills, fatigue, sinus pain, sore throat, SOB, wheezing, chest pain, nausea, changes in bowel or bladder habits.    ROS: As per HPI.  Past Medical History:  Diagnosis Date  . Anxiety state 12/23/2015  . Benign essential HTN 02/08/2015  . Cancer [facility])    Kidney Cancer  . Chronic kidney disease   . Chronic renal insufficiency 02/14/2015  . Constipation 12/23/2015  . Dyslipidemia 02/08/2015  . H/O renal cell carcinoma 02/08/2015  . History of blood transfusion 2010   After Kidney surgery  . History of chicken pox   . Hyperglycemia 12/23/2015  . Hyperlipidemia   . Hypertension   . Ingrown left big toenail 02/14/2015  . Left foot pain 02/14/2015  . Mitral valve prolapse   . MVP (mitral valve prolapse) 05/14/2016  . Parkinson disease ([place]) 12/23/2015  . Preventative health care 06/21/2016  . Thrombocytopenia ([place]) 02/08/2015  . Tremor of right hand 02/14/2015   Past Surgical History:  Procedure Laterality Date  . APPENDECTOMY  1995  . CATARACT EXTRACTION, BILATERAL    . left knee scope  2003  . right knee scope  1999  . TOE SURGERY    . TONSILLECTOMY    . TOTAL KNEE ARTHROPLASTY Left 07/06/2014  . TOTAL  NEPHRECTOMY Right    Allergies  Allergen Reactions  . Nsaids   . Sulfa Antibiotics Other (See Comments)    Had a reaction as a child  . Tolmetin Other (See Comments)   No current facility-administered medications on file prior to encounter.    Current Outpatient Medications on File Prior to Encounter  Medication Sig Dispense Refill  . acetaminophen (TYLENOL) 500 MG tablet Take 500 mg by mouth every 6 (six) hours as needed.    Marland Kitchen amLODipine (NORVASC) 5 MG tablet Take 0.5 tablets (2.5 mg total) by mouth 2 (two) times daily. 30 tablet 5  . aspirin 81 MG tablet Take 81 mg by mouth daily.    Marland Kitchen atenolol (TENORMIN) 100 MG tablet TAKE 1/2 TABLET BY MOUTH DAILY 45 tablet 2  . atorvastatin (LIPITOR) 20 MG tablet TAKE 1 TABLET BY MOUTH DAILY 90 tablet 1  . carbidopa-levodopa (SINEMET CR) 50-200 MG tablet TAKE 1 TABLET AT BEDTIME BY MOUTH. 90 tablet 1  . carbidopa-levodopa (SINEMET IR) 25-100 MG tablet TAKE 2 TABLETS PO IN THE MORNING, 1 TABLET IN THE AFTERNOON, 1 TABLET IN THE EVENING,  1 EXTRA TABLET PRN 450 tablet 1  . cetirizine (ZYRTEC) 10 MG tablet Take 1 tablet (10 mg total) by mouth 2 (two) times daily as needed for allergies. 30 tablet 2  . clonazePAM (KLONOPIN) 0.5 MG tablet Take 0.5 tablets (0.25 mg total) by mouth at bedtime.  45 tablet 1  . divalproex (DEPAKOTE ER) 250 MG 24 hr tablet Take 1 tablet (250 mg total) by mouth daily. 90 tablet 1  . escitalopram (LEXAPRO) 10 MG tablet TAKE 1 TABLET (10 MG TOTAL) BY MOUTH DAILY. 90 tablet 1  . fluticasone (FLONASE) 50 MCG/ACT nasal spray Place 2 sprays into both nostrils daily. 16 g 6  . latanoprost (XALATAN) 0.005 % ophthalmic solution PLACE 1 DROP INTO BOTH EYES NIGHTLY.    . Multiple Vitamins-Minerals (CENTRUM SILVER PO) Take by mouth daily.    . mupirocin ointment (BACTROBAN) 2 % Place 1 application into the nose at bedtime. 22 g 0  . sodium chloride (OCEAN) 0.65 % SOLN nasal spray Place 1 spray into both nostrils as needed for congestion. 88  mL 2   Social History   Socioeconomic History  . Marital status: Married    Spouse name: Not on file  . Number of children: Not on file  . Years of education: 81  . Highest education level: Not on file  Occupational History  . Occupation: retired Freight forwarder in [place]  . Financial resource strain: Not on file  . Food insecurity:    Worry: Not on file    Inability: Not on file  . Transportation needs:    Medical: Not on file    Non-medical: Not on file  Tobacco Use  . Smoking status: Former Smoker    Types: Pipe    Last attempt to quit: 11/18/1995    Years since quitting: 22.6  . Smokeless tobacco: Never Used  . Tobacco comment: stopped 20 years ago.  1996  Substance and Sexual Activity  . Alcohol use: Yes    Alcohol/week: 0.0 standard drinks    Comment: rare/ social  . Drug use: No  . 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 Physical activity:    Days per week: Not on file    Minutes per session: Not on file  . Stress: Not on file  Relationships  . Social connections:    Talks on phone: Not on file    Gets together: Not on file    Attends religious service: Not on file    Active member of club or organization: Not on file    Attends meetings of clubs or organizations: Not on file    Relationship status: Not on file  . Intimate partner violence:    Fear of current or ex partner: Not on file    Emotionally abused: Not on file    Physically abused: Not on file    Forced sexual activity: Not on file  Other Topics Concern  . Not on file  Social History Narrative  . Not on file   Family History  Problem Relation Age of Onset  . Alcohol abuse Father   . Hyperlipidemia Father   . Hypertension Father   . Diabetes Father   . Heart disease Father   . Cancer Maternal Aunt        lung cancer  . Cancer Paternal Uncle        bone cancer  .  Heart attack Mother   . Stroke Mother        swelling in brain stem    OBJECTIVE:  Vitals:   07/09/18 1303  BP: 140/76  Pulse: 72  Resp: 18  Temp: (!) 97.3 F (36.3 C)  SpO2: 96%     General appearance: alert; well-appearing HEENT: Ears: EACs clear, TMs pearly gray with visible cone of light, without erythema; Eyes: PERRL, EOMI grossly; Nose: nares patent, with dried blood around RT nare, turbinates swollen and erythematous, no obvious sites of active bleeding, scant blood in right nostil; Throat: oropharynx clear, trace blood evident on RT tonsil, uvula midline Neck: supple without LAD Lungs: CTAB Heart: regular rate and rhythm.  Radial pulses 2+ symmetrical bilaterally Skin: warm and dry Psychological: alert and cooperative; normal mood and affect  ASSESSMENT & PLAN:  1. Epistaxis     Meds ordered this encounter  Medications  . oxymetazoline (AFRIN NASAL SPRAY) 0.05 % nasal spray    Sig: Place 1 spray into both nostrils 2 (two) times daily.    Dispense:  30 mL    Refill:  0    Order Specific Question:   Supervising Provider    Answer:   Raylene Everts [[id number]]    No active bleed at this time Hold tip of nose with forefinger and thumb with active bleed.  DO NOT lean back You may try using OTC afrin  Recommending further evaluation and management by ENT Return or go to the ED if you have any new or worsening symptoms such as persistent bleed, nausea, vomiting, fever, chills, neurological deficits, headache, lightheadedness, chest pain, shortness of breath, etc..  Reviewed expectations re: course of current medical issues. Questions answered. Outlined signs and symptoms indicating need for more acute intervention. Patient verbalized understanding. After Visit Summary given.         Lestine Box, PA-C 07/09/18 1421

## 2018-07-09 NOTE — Telephone Encounter (Addendum)
Patient's wife Lenon Ahmadi retired nurse] called and reported that patient is having nosebleeds again; he woke up this morning with one, had another later in the morning and had to use ice to get it to stop, and is now currently having another. He was having HTN and PCP doubled his dosage and his BP is good today at 120/70. Spoke with [name], [facility] and was instructed to have patient report to ED and place referral to ENT. Due to pt's chronic medical conditions and unknown status of covid at ED; patient is reporting to [facility]. Referral to Otolaryngology/ENT placed in Epic/SLS 04/08

## 2018-07-09 NOTE — ED Triage Notes (Signed)
Nose bleed intermittently since monday

## 2018-07-09 NOTE — Discharge Instructions (Signed)
No active bleed at this time Hold tip of nose with forefinger and thumb with active bleed.  DO NOT lean back You may try using OTC afrin  Recommending further evaluation and management by ENT Return or go to the ED if you have any new or worsening symptoms such as persistent bleed, nausea, vomiting, fever, chills, neurological deficits, headache, lightheadedness, chest pain, shortness of breath, etc..

## 2018-07-10 NOTE — Telephone Encounter (Signed)
Thank you :)

## 2018-07-13 DIAGNOSIS — G20 Parkinson's disease: Secondary | ICD-10-CM | POA: Diagnosis not present

## 2018-07-13 DIAGNOSIS — I10 Essential (primary) hypertension: Secondary | ICD-10-CM | POA: Diagnosis not present

## 2018-07-13 DIAGNOSIS — R040 Epistaxis: Secondary | ICD-10-CM | POA: Diagnosis not present

## 2018-07-15 DIAGNOSIS — Z87891 Personal history of nicotine dependence: Secondary | ICD-10-CM | POA: Diagnosis not present

## 2018-07-15 DIAGNOSIS — R040 Epistaxis: Secondary | ICD-10-CM | POA: Diagnosis not present

## 2018-07-15 DIAGNOSIS — Z7289 Other problems related to lifestyle: Secondary | ICD-10-CM | POA: Diagnosis not present

## 2018-07-18 ENCOUNTER — Encounter: Payer: Medicare Other | Admitting: Gastroenterology

## 2018-07-28 MED FILL — AMLODIPINE BESYLATE 5 MG TA: 5 | 30 days supply | Qty: 30 | Fill #1

## 2018-07-28 MED FILL — CARBIDOPA-LEVO ER 50-200 TA: 50-200 | 90 days supply | Qty: 90 | Fill #1

## 2018-07-28 MED FILL — ATORVASTATIN 20 MG TABLET: 20 | 90 days supply | Qty: 90 | Fill #0

## 2018-08-18 DIAGNOSIS — H43813 Vitreous degeneration, bilateral: Secondary | ICD-10-CM | POA: Diagnosis not present

## 2018-08-18 DIAGNOSIS — Z83511 Family history of glaucoma: Secondary | ICD-10-CM | POA: Diagnosis not present

## 2018-08-18 DIAGNOSIS — H5202 Hypermetropia, left eye: Secondary | ICD-10-CM | POA: Diagnosis not present

## 2018-08-18 DIAGNOSIS — H52203 Unspecified astigmatism, bilateral: Secondary | ICD-10-CM | POA: Diagnosis not present

## 2018-08-18 DIAGNOSIS — Z961 Presence of intraocular lens: Secondary | ICD-10-CM | POA: Diagnosis not present

## 2018-08-18 DIAGNOSIS — H401132 Primary open-angle glaucoma, bilateral, moderate stage: Secondary | ICD-10-CM | POA: Diagnosis not present

## 2018-08-18 DIAGNOSIS — H524 Presbyopia: Secondary | ICD-10-CM | POA: Diagnosis not present

## 2018-08-19 IMAGING — MR MR HEAD WO/W CM
12 series · 45 of 48 positions shown · IV contrast (multihance)
Comparison: None.

CLINICAL DATA: Parkinson's disease. Hyper reflexia. Personal
history of renal cell carcinoma.

EXAM:
MRI HEAD WITHOUT AND WITH CONTRAST
TECHNIQUE: Multiplanar, multiecho pulse sequences of the brain and surrounding
structures were obtained without and with intravenous contrast.
CONTRAST:  20mL MULTIHANCE GADOBENATE DIMEGLUMINE 529 MG/ML IV SOLN

[Series 2: t1_se_sag · sagittal · 5.0mm · 0.45mm/px · 1 of 23 slices shown]
[im 1/23]
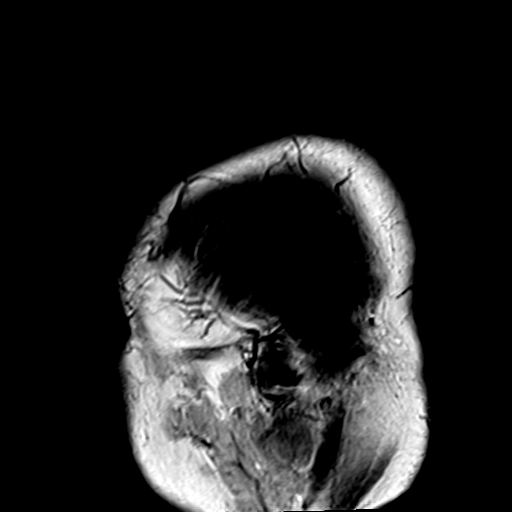

[Series 3: ep2d_diff_(id)_trace · axial · 3.0mm · 1.80mm/px · z∈[-43,+103]mm · 8 of 100 slices shown]
[im 1/100]
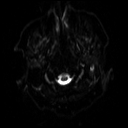
[im 15/100]
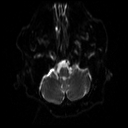
[im 29/100]
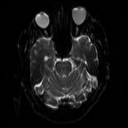
[im 43/100]
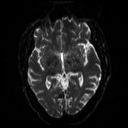
[im 57/100]
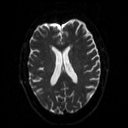
[im 71/100]
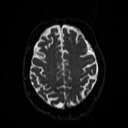
[im 85/100]
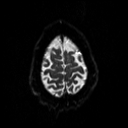
[im 100/100]
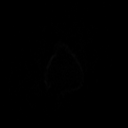

[Series 4: ep2d_diff_(id)_trace_adc · axial · 3.0mm · 1.80mm/px · z∈[-43,+103]mm · 4 of 47 slices shown]
[im 1/47]
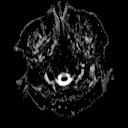
[im 16/47]
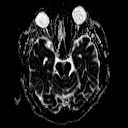
[im 31/47]
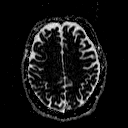
[im 47/47]
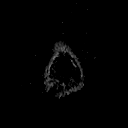

[Series 6: swi_images · axial · 2.0mm · 0.90mm/px · z∈[-48,+109]mm · 7 of 80 slices shown]
[im 1/80]
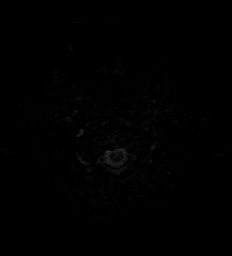
[im 14/80]
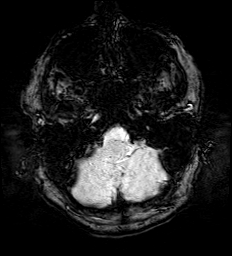
[im 27/80]
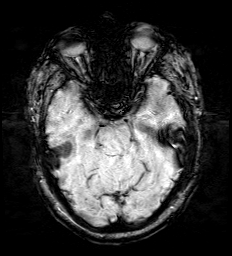
[im 40/80]
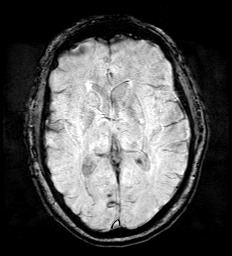
[im 53/80]
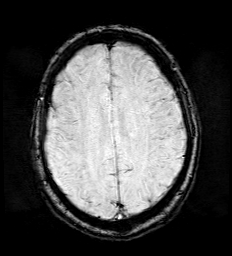
[im 66/80]
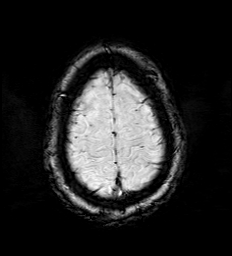
[im 80/80]
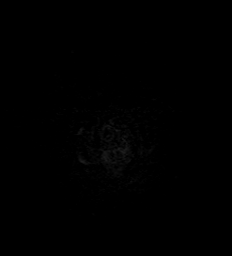

[Series 9: FLAIR · axial · 5.0mm · 0.45mm/px · z∈[-45,+97]mm · 2 of 24 slices shown]
[im 1/24]
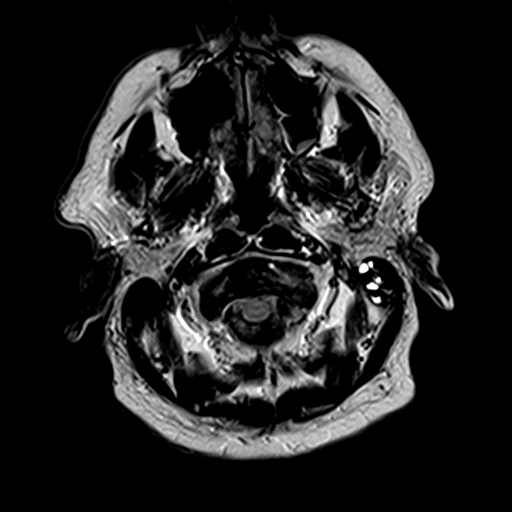
[im 24/24]
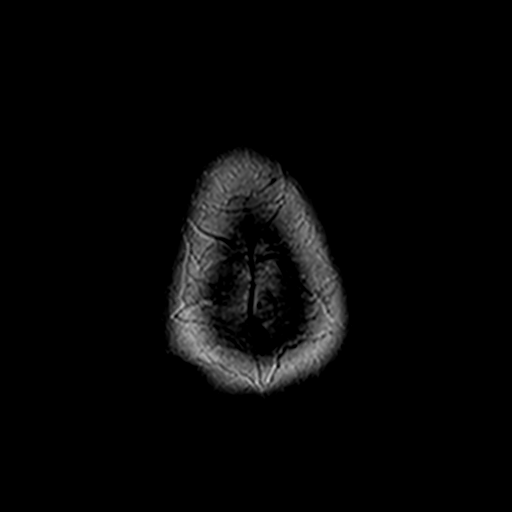

[Series 10: ep2d_diff_cor · coronal · 5.0mm · 1.77mm/px · 4 of 50 slices shown]
[im 1/50]
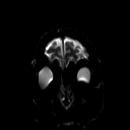
[im 17/50]
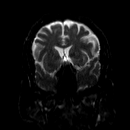
[im 33/50]
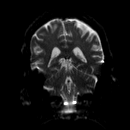
[im 50/50]
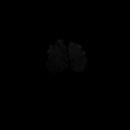

[Series 11: ep2d_diff_cor_adc · coronal · 5.0mm · 1.77mm/px · 2 of 25 slices shown]
[im 1/25]
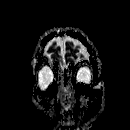
[im 25/25]
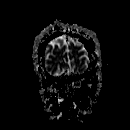

[Series 12: t2_tse_tra_512 · axial · 5.0mm · 0.60mm/px · z∈[-43,+99]mm · 2 of 24 slices shown]
[im 1/24]
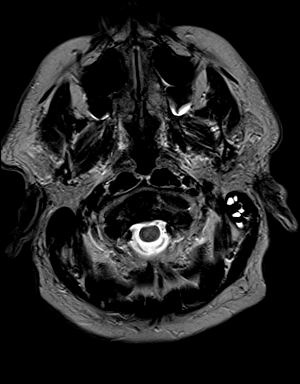
[im 24/24]
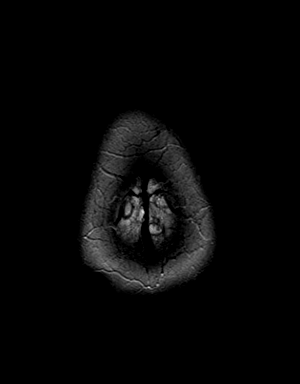

[Series 13: t1_mpr_tra · axial · 2.0mm · 0.45mm/px · z∈[-51,+106]mm · 7 of 80 slices shown]
[im 1/80]
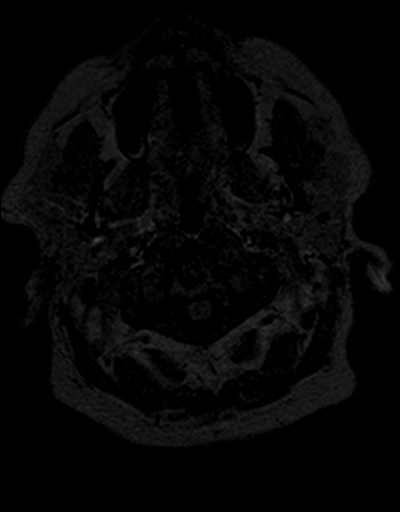
[im 14/80]
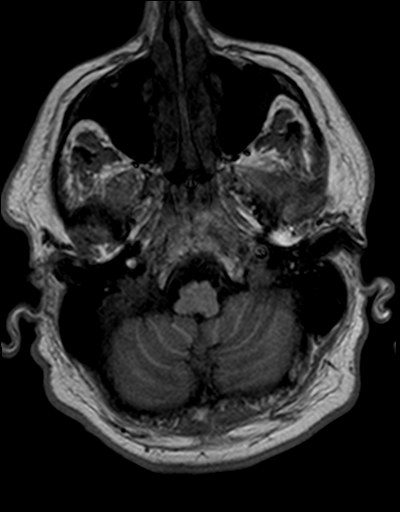
[im 27/80]
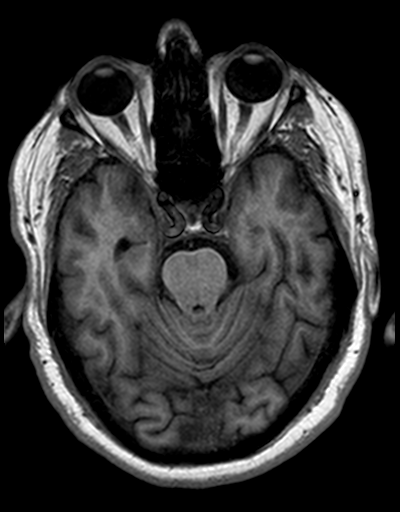
[im 40/80]
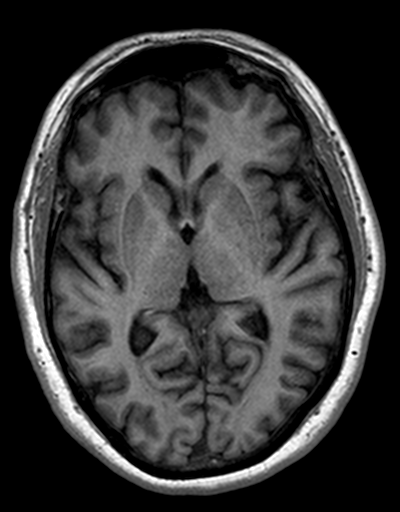
[im 53/80]
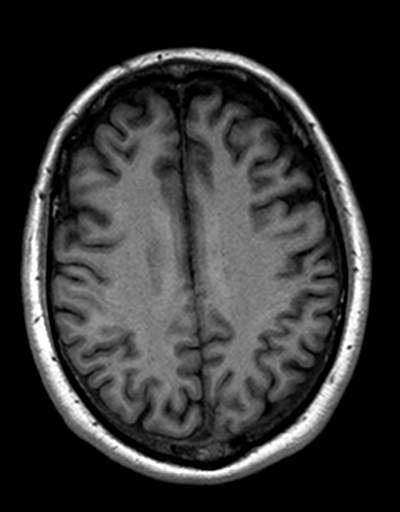
[im 66/80]
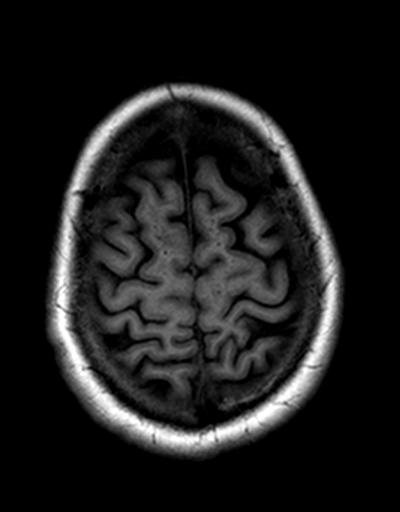
[im 80/80]
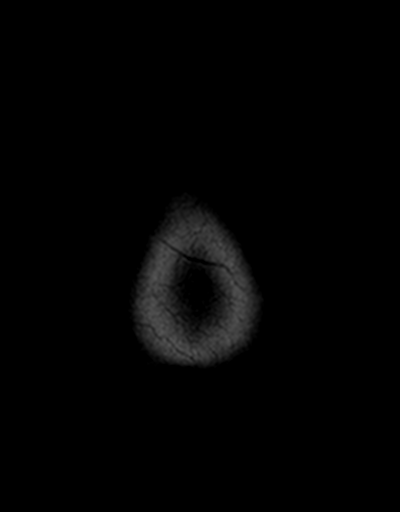

[Series 14: T2 · coronal · 5.0mm · 0.45mm/px · 2 of 25 slices shown]
[im 1/25]
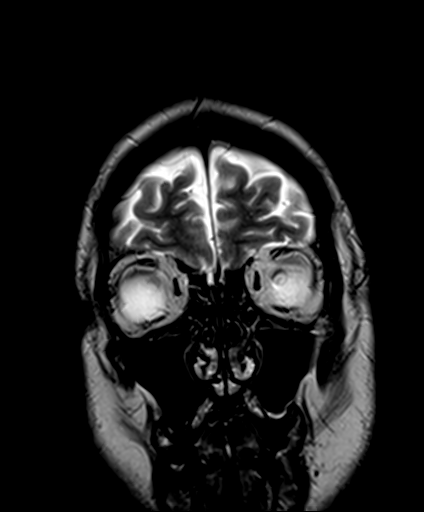
[im 25/25]
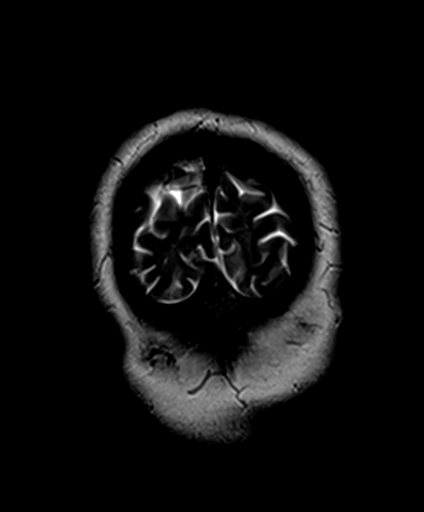

[Series 15: T1 post-contrast · coronal · 5.0mm · 0.72mm/px · 2 of 25 slices shown]
[im 1/25]
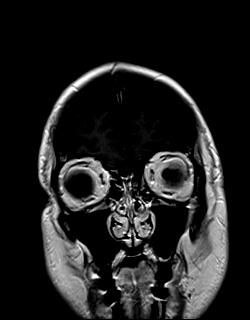
[im 25/25]
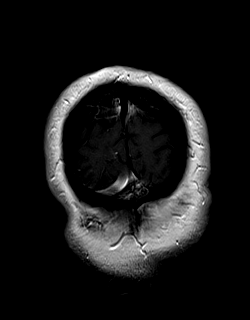

[Series 16: post t1_mpr_tra · axial · 2.0mm · 0.45mm/px · z∈[-51,+27]mm · 4 of 80 slices shown]
[im 1/80]
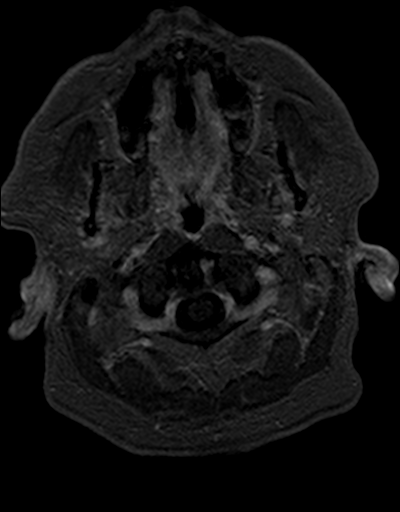
[im 14/80]
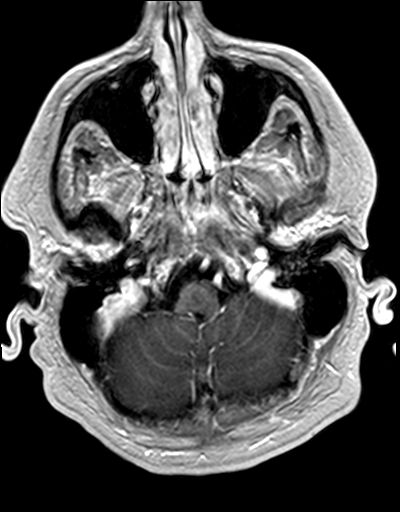
[im 27/80]
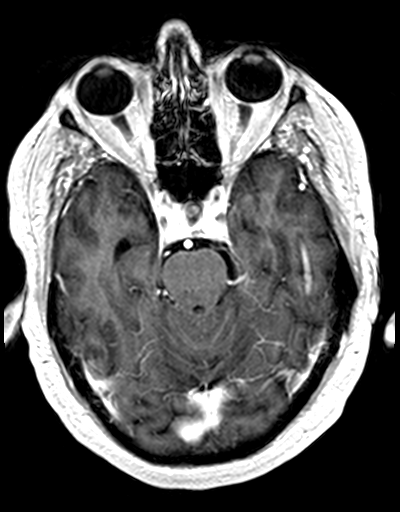
[im 40/80]
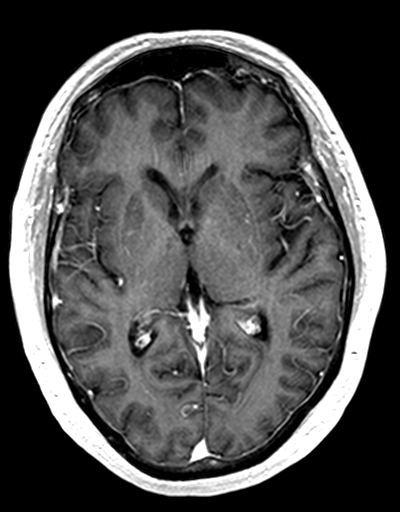

[45 of 48 positions shown; findings below may reference images not displayed]

FINDINGS: No acute infarct, hemorrhage, or mass lesion is present. The
ventricles are of normal size. No significant extraaxial fluid
collection is present.

Mild periventricular and subcortical T2 changes bilaterally are
within normal limits for age.

The internal auditory canals are within normal limits. The brainstem
and cerebellum are normal. Flow is present in the major intracranial
arteries. The globes and orbits are intact.

Minimal mucosal thickening is present along the inferior maxillary
sinuses bilaterally. There is some fluid in the left mastoid air
cells. No obstructing nasopharyngeal lesion is present.

The postcontrast images demonstrate no pathologic enhancement.

Skullbase is within normal limits. Midline sagittal images are
unremarkable.
IMPRESSION: 1. Normal MRI appearance of the brain for age.
2. Left mastoid fluid without obstructing nasopharyngeal lesion.

## 2018-08-22 ENCOUNTER — Other Ambulatory Visit: Payer: Self-pay | Admitting: Family Medicine

## 2018-08-22 MED FILL — clonazePAM 0.5 MG TABS: 0.5 | 90 days supply | Qty: 45 | Fill #1

## 2018-08-22 MED FILL — AMLODIPINE BESYLATE 5 MG TA: 5 | 30 days supply | Qty: 30 | Fill #2

## 2018-08-22 MED FILL — DIVALPROEX SOD ER 250 MG TA: 250 | 90 days supply | Qty: 90 | Fill #1

## 2018-08-26 MED FILL — ATENOLOL 100 MG TAB: 100 | 90 days supply | Qty: 45 | Fill #0

## 2018-08-27 ENCOUNTER — Other Ambulatory Visit: Payer: Self-pay

## 2018-08-27 ENCOUNTER — Ambulatory Visit (AMBULATORY_SURGERY_CENTER): Payer: Self-pay

## 2018-08-27 VITALS — Ht 75.0 in | Wt 235.0 lb

## 2018-08-27 DIAGNOSIS — Z86010 Personal history of colonic polyps: Secondary | ICD-10-CM

## 2018-08-27 MED ORDER — NA SULFATE-K SULFATE-MG SULF 17.5-3.13-1.6 GM/177ML PO SOLN: 1.0000 | Freq: Once | ORAL | 0 refills | Status: AC

## 2018-08-27 NOTE — Progress Notes (Signed)
Denies allergies to eggs or soy products. Denies complication of anesthesia or sedation. Denies use of weight loss medication. Denies use of O2.   Emmi instructions given for colonoscopy.   Pre-Visit was conducted by phone due to Covid 19. Instructions were reviewed with patient and his wife and were mailed to confirmed home address. Patient was encouraged to call if he had any questions or concerns regarding instructions.

## 2018-09-04 MED FILL — SUPREP BOWEL PREP KIT: 17.5-3.13-1 | 1 days supply | Qty: 354 | Fill #0

## 2018-09-05 ENCOUNTER — Other Ambulatory Visit: Payer: Medicare Other

## 2018-09-05 ENCOUNTER — Other Ambulatory Visit: Payer: Self-pay

## 2018-09-05 ENCOUNTER — Telehealth: Payer: Self-pay | Admitting: *Deleted

## 2018-09-05 NOTE — Telephone Encounter (Signed)
Attempted to reach pt to schedule lab appt for 09/10/18 and left message to return my call and ask for me. Pt was concerned that he would need to r/s follow up with PCP on the 11th but if he can return on the 10th in the morning for labs they should be back by his appt on the 11th.   Pt came for labs today. Last a1c was 06/09/18 so there could be a problem with insurance coverage if labs were drawn too soon. I notified pt. He will call back to schedule another appt as he states he will be travelling through part of June and wanted labs done before the 15th. Per verbal from PCP, pt can have all labs done 09/10/18.

## 2018-09-08 ENCOUNTER — Telehealth: Payer: Self-pay | Admitting: *Deleted

## 2018-09-08 NOTE — Telephone Encounter (Signed)
Spoke with pt's spouse. Pt is not currently with her. She states pt has a colonoscopy on 6/9 and he did not want to do labs on 6/10 as he felt he would still be dehydrated from the prep/procedure the day before and it may affect his lab results. Pt wants to do labs prior to his appt with Dr Charlett Blake as he states that is what PCP told him to do. She asked if pt could r/s for next week. I advised that there is lab availability but PCP will not have anything until 10/02/18. She states she will discuss this with pt when she gets home. Pt is also scheduled to see his urologist in July and they may wait and have him do labs. She states they may send mychart message to PCP about the direction pt decides to take re: labs and follow up.

## 2018-09-08 NOTE — Telephone Encounter (Signed)
Covid-19 screening questions  Have you traveled in the last 14 days? no If yes where?  Do you now or have you had a fever in the last 14 days? no  Do you have any respiratory symptoms of shortness of breath or cough now or in the last 14 days? no  Do you have any family members or close contacts with diagnosed or suspected Covid-19 in the past 14 days? no  Have you been tested for Covid-19 and found to be positive? no   Pt has parkinsons.  Advised him that it is ok for his wife to come in with him for assistance.

## 2018-09-08 NOTE — Telephone Encounter (Signed)
Pt's spouse called back and scheduled lab appt for 09/16/18 at 10:30am and follow up with Dr Charlett Blake for 10/02/18 at 10:40am.

## 2018-09-09 ENCOUNTER — Other Ambulatory Visit: Payer: Self-pay

## 2018-09-09 ENCOUNTER — Ambulatory Visit (AMBULATORY_SURGERY_CENTER): Payer: Medicare Other | Admitting: Gastroenterology

## 2018-09-09 ENCOUNTER — Encounter: Payer: Self-pay | Admitting: Gastroenterology

## 2018-09-09 VITALS — BP 120/64 | HR 55 | Temp 97.7°F | Resp 11 | Ht 75.0 in | Wt 235.0 lb

## 2018-09-09 DIAGNOSIS — D122 Benign neoplasm of ascending colon: Secondary | ICD-10-CM | POA: Diagnosis not present

## 2018-09-09 DIAGNOSIS — D123 Benign neoplasm of transverse colon: Secondary | ICD-10-CM

## 2018-09-09 DIAGNOSIS — Z86010 Personal history of colonic polyps: Secondary | ICD-10-CM

## 2018-09-09 DIAGNOSIS — K635 Polyp of colon: Secondary | ICD-10-CM

## 2018-09-09 DIAGNOSIS — D125 Benign neoplasm of sigmoid colon: Secondary | ICD-10-CM

## 2018-09-09 MED ORDER — SODIUM CHLORIDE 0.9 % IV SOLN: 500.0000 mL | Freq: Once | INTRAVENOUS | Status: DC

## 2018-09-09 NOTE — Progress Notes (Signed)
PT taken to PACU. Monitors in place. VSS. Report given to RN. 

## 2018-09-09 NOTE — Patient Instructions (Signed)
Handouts Provided:  Polyps  YOU HAD AN ENDOSCOPIC PROCEDURE TODAY AT [facility]:   Refer to the procedure report that was given to you for any specific questions about what was found during the examination.  If the procedure report does not answer your questions, please call your gastroenterologist to clarify.  If you requested that your care partner not be given the details of your procedure findings, then the procedure report has been included in a sealed envelope for you to review at your convenience later.  YOU SHOULD EXPECT: Some feelings of bloating in the abdomen. Passage of more gas than usual.  Walking can help get rid of the air that was put into your GI tract during the procedure and reduce the bloating. If you had a lower endoscopy (such as a colonoscopy or flexible sigmoidoscopy) you may notice spotting of blood in your stool or on the toilet paper. If you underwent a bowel prep for your procedure, you may not have a normal bowel movement for a few days.  Please Note:  You might notice some irritation and congestion in your nose or some drainage.  This is from the oxygen used during your procedure.  There is no need for concern and it should clear up in a day or so.  SYMPTOMS TO REPORT IMMEDIATELY:   Following lower endoscopy (colonoscopy or flexible sigmoidoscopy):  Excessive amounts of blood in the stool  Significant tenderness or worsening of abdominal pains  Swelling of the abdomen that is new, acute  Fever of 100F or higher  For urgent or emergent issues, a gastroenterologist can be reached at any hour by calling [phone].   DIET:  We do recommend a small meal at first, but then you may proceed to your regular diet.  Drink plenty of fluids but you should avoid alcoholic beverages for 24 hours.  ACTIVITY:  You should plan to take it easy for the rest of today and you should NOT DRIVE or use heavy machinery until tomorrow (because of the sedation  medicines used during the test).    FOLLOW UP: Our staff will call the number listed on your records 48-72 hours following your procedure to check on you and address any questions or concerns that you may have regarding the information given to you following your procedure. If we do not reach you, we will leave a message.  We will attempt to reach you two times.  During this call, we will ask if you have developed any symptoms of COVID 19. If you develop any symptoms (ie: fever, flu-like symptoms, shortness of breath, cough etc.) before then, please call [phone].  If you test positive for Covid 19 in the 2 weeks post procedure, please call and report this information to [place].    If any biopsies were taken you will be contacted by phone or by letter within the next 1-3 weeks.  Please call us at [phone] if you have not heard about the biopsies in 3 weeks.    SIGNATURES/CONFIDENTIALITY: You and/or your care partner have signed paperwork which will be entered into your electronic medical record.  These signatures attest to the fact that that the information above on your After Visit Summary has been reviewed and is understood.  Full responsibility of the confidentiality of this discharge information lies with you and/or your care-partner.

## 2018-09-09 NOTE — Progress Notes (Signed)
Called to room to assist during endoscopic procedure.  Patient ID and intended procedure confirmed with present staff. Received instructions for my participation in the procedure from the performing physician.  

## 2018-09-09 NOTE — Progress Notes (Signed)
Pt's states no medical or surgical changes since previsit or office visit. 

## 2018-09-09 NOTE — Progress Notes (Signed)
Temp taken by [facility], CMA, VS taken by Rica Mote, CMA

## 2018-09-09 NOTE — Op Note (Signed)
[place] Patient Name: Vincent Walker Procedure Date: 09/09/2018 10:03 AM MRN: [id number] Endoscopist: Jackquline Denmark , MD Age: 73 Referring MD:  Date of Birth: 27-Jan-1946 Gender: Male Account #: [id number] Procedure:                Colonoscopy Indications:              High risk colon cancer surveillance: Personal                            history of colonic polyps at [facility] 01/2012 Medicines:                Monitored Anesthesia Care Procedure:                Pre-Anesthesia Assessment:                           - Prior to the procedure, a History and Physical                            was performed, and patient medications and                            allergies were reviewed. The patient's tolerance of                            previous anesthesia was also reviewed. The risks                            and benefits of the procedure and the sedation                            options and risks were discussed with the patient.                            All questions were answered, and informed consent                            was obtained. Prior Anticoagulants: The patient has                            taken no previous anticoagulant or antiplatelet                            agents. ASA Grade Assessment: III - A patient with                            severe systemic disease. After reviewing the risks                            and benefits, the patient was deemed in  satisfactory condition to undergo the procedure.                           After obtaining informed consent, the colonoscope                            was passed under direct vision. Throughout the                            procedure, the patient's blood pressure, pulse, and                            oxygen saturations were monitored continuously. The                            Colonoscope was introduced through the anus  and                            advanced to the the ascending colon. Cecum could                            not be intubated despite of abdominal pressure,                            changing the position of the patient. The                            colonoscopy was technically difficult and complex                            due to significant looping and a tortuous colon.                            The patient tolerated the procedure well. The                            quality of the bowel preparation was good. Scope In: 10:15:33 AM Scope Out: 10:51:42 AM Total Procedure Duration: 0 hours 36 minutes 9 seconds  Findings:                 A 25 mm semi-pedunculated polyp was found in the                            mid ascending colon, 3 folds above the ileocecal                            valve. We could not keep scope in a good position                            for safe polypectomy. Biopsies were taken with a                            cold forceps for histology. Estimated blood loss:  none.                           11 sessile polyps were found in the proximal                            transverse colon, hepatic flexure and proximal and                            mid ascending colon. The polyps were 6 to 8 mm in                            size. These polyps were removed with a cold snare.                            Resection and retrieval were complete. Estimated                            blood loss was minimal.                           A 10 mm polyp was found in the mid sigmoid colon.                            The polyp was semi-pedunculated. The polyp was                            removed with a hot snare. Resection and retrieval                            were complete. Estimated blood loss: none.                           Non-bleeding internal hemorrhoids were found during                            retroflexion. The hemorrhoids were  small. Complications:            No immediate complications. Estimated Blood Loss:     Estimated blood loss: none. Impression:               -Large ascending colon polyp (biopsied)                           -Colonic polyps status post polypectomy.                           -Cecum not intubated. Recommendation:           - Patient has a contact number available for                            emergencies. The signs and symptoms of potential                            delayed complications were  discussed with the                            patient. Return to normal activities tomorrow.                            Written discharge instructions were provided to the                            patient.                           - Resume previous diet.                           - Continue present medications.                           - Await pathology results.                           - Repeat colonoscopy at [facility] with EMR. Note that                            patient has borderline thrombocytopenia. Will                            discuss with Dr. Rush Landmark. Jackquline Denmark, MD 09/09/2018 11:05:01 AM This report has been signed electronically.

## 2018-09-10 ENCOUNTER — Other Ambulatory Visit: Payer: Medicare Other

## 2018-09-11 ENCOUNTER — Telehealth: Payer: Self-pay | Admitting: *Deleted

## 2018-09-11 ENCOUNTER — Ambulatory Visit: Payer: Medicare Other | Admitting: Family Medicine

## 2018-09-11 NOTE — Telephone Encounter (Signed)
1. Have you developed a fever since your procedure? no  2.   Have you had an respiratory symptoms (SOB or cough) since your procedure? no  3.   Have you tested positive for COVID 19 since your procedure no  4.   Have you had any family members/close contacts diagnosed with the COVID 19 since your procedure?  no   If yes to any of these questions please route to Joylene John, RN and Alphonsa Gin, Therapist, sports.   Follow up Call-  Call back number 09/09/2018  Post procedure Call Back phone  # [phone]  Permission to leave phone message Yes  Some recent data might be hidden     Patient questions:  Do you have a fever, pain , or abdominal swelling? No. Pain Score  0 *  Have you tolerated food without any problems? Yes.    Have you been able to return to your normal activities? Yes.    Do you have any questions about your discharge instructions: Diet   No. Medications  No. Follow up visit  No.  Do you have questions or concerns about your Care? No.  Actions: * If pain score is 4 or above: No action needed, pain <4.

## 2018-09-11 NOTE — Telephone Encounter (Signed)
No answer.  LM on VM.  

## 2018-09-15 MED FILL — AMLODIPINE BESYLATE 5 MG TA: 5 | 30 days supply | Qty: 30 | Fill #3

## 2018-09-16 ENCOUNTER — Other Ambulatory Visit (INDEPENDENT_AMBULATORY_CARE_PROVIDER_SITE_OTHER): Payer: Medicare Other

## 2018-09-16 ENCOUNTER — Other Ambulatory Visit: Payer: Self-pay

## 2018-09-16 DIAGNOSIS — E785 Hyperlipidemia, unspecified: Secondary | ICD-10-CM | POA: Diagnosis not present

## 2018-09-16 DIAGNOSIS — I10 Essential (primary) hypertension: Secondary | ICD-10-CM

## 2018-09-16 DIAGNOSIS — D696 Thrombocytopenia, unspecified: Secondary | ICD-10-CM

## 2018-09-16 DIAGNOSIS — R739 Hyperglycemia, unspecified: Secondary | ICD-10-CM

## 2018-09-16 LAB — COMPREHENSIVE METABOLIC PANEL
ALT: 6 U/L (ref 0–53)
AST: 18 U/L (ref 0–37)
Albumin: 4.4 g/dL (ref 3.5–5.2)
Alkaline Phosphatase: 68 U/L (ref 39–117)
BUN: 18 mg/dL (ref 6–23)
CO2: 29 mEq/L (ref 19–32)
Calcium: 9.4 mg/dL (ref 8.4–10.5)
Chloride: 102 mEq/L (ref 96–112)
Creatinine, Ser: 1.46 mg/dL (ref 0.40–1.50)
GFR: 47.02 mL/min — ABNORMAL LOW (ref >60.00)
Glucose, Bld: 94 mg/dL (ref 70–99)
Potassium: 4.3 mEq/L (ref 3.5–5.1)
Sodium: 138 mEq/L (ref 135–145)
Total Bilirubin: 0.7 mg/dL (ref 0.2–1.2)
Total Protein: 6.4 g/dL (ref 6.0–8.3)

## 2018-09-16 LAB — LIPID PANEL
Cholesterol: 108 mg/dL (ref 0–200)
HDL: 42.1 mg/dL (ref >39.00)
LDL Cholesterol: 34 mg/dL (ref 0–99)
NonHDL: 65.72
Total CHOL/HDL Ratio: 3
Triglycerides: 158 mg/dL — ABNORMAL HIGH (ref 0.0–149.0)
VLDL: 31.6 mg/dL (ref 0.0–40.0)

## 2018-09-16 LAB — CBC
HCT: 45.6 % (ref 39.0–52.0)
Hemoglobin: 15.4 g/dL (ref 13.0–17.0)
MCHC: 33.8 g/dL (ref 30.0–36.0)
MCV: 92.8 fl (ref 78.0–100.0)
Platelets: 157 10*3/uL (ref 150.0–400.0)
RBC: 4.91 Mil/uL (ref 4.22–5.81)
RDW: 13.5 % (ref 11.5–15.5)
WBC: 8 10*3/uL (ref 4.0–10.5)

## 2018-09-16 LAB — HEMOGLOBIN A1C: Hgb A1c MFr Bld: 6 % (ref 4.6–6.5)

## 2018-09-16 LAB — TSH: TSH: 2.91 u[IU]/mL (ref 0.35–4.50)

## 2018-09-17 ENCOUNTER — Telehealth: Payer: Self-pay | Admitting: Gastroenterology

## 2018-09-17 NOTE — Telephone Encounter (Signed)
I have discussed in detail the biopsy results with the patient's wife who is a retired Haematologist.  I have also sent staff message to Dr. Rush Landmark (interventional gastroenterology) for colonoscopy with EMR at [facility].   Due to Covid-19, all the cases are backed up.  Patient's wife understands completely.  She will get in touch with [place] in case of any problems.  Patient had labs done yesterday.  I have reviewed all the labs with patient's wife as well.  Platelets were normal.  RG

## 2018-09-18 ENCOUNTER — Telehealth: Payer: Self-pay

## 2018-09-18 NOTE — Telephone Encounter (Signed)
appt made with Dr Rush Landmark on 7/7 915 am Letter mailed with appt information

## 2018-09-18 NOTE — Telephone Encounter (Signed)
-----   Message from Irving Copas., MD sent at 09/17/2018  5:03 PM EDT ----- Regarding: RE: routine colon with EMR Raj, No problem. Happy to discuss with him and then get him set up for a Colon with EMR. Kimberle Stanfill, let's set up a short visit to discuss EMR techniques and look for a Colonoscopy with EMR 90 minute time slot for him in the next few weeks. Thanks. GM ----- Message ----- From: Jackquline Denmark, MD Sent: 09/17/2018  12:24 PM EDT To: Irving Copas., MD Subject: routine colon with EMR                         Hi Gabe,  Can I ask you for colon with EMR of ascending colon polyp on this very nice gentleman.   Routine please.  73yr old with large asc colon polyp (Bx-tubular adenoma). I think it is on a short pedicle.  I could not keep my scope in good position and could not intubate cecum at [facility].  I think I put in more air in the colon and it was very redundant.  I did take out 12 other polyps.  The colon was somewhat redundant.   His platelets are normal.  If you could please do it at [facility] (with Co2)  Thanks for your help, as always. Pl let me know.  Merrie Roof

## 2018-09-19 ENCOUNTER — Ambulatory Visit: Payer: Medicare Other | Admitting: Family Medicine

## 2018-09-29 DIAGNOSIS — H04123 Dry eye syndrome of bilateral lacrimal glands: Secondary | ICD-10-CM | POA: Diagnosis not present

## 2018-09-29 DIAGNOSIS — H43812 Vitreous degeneration, left eye: Secondary | ICD-10-CM | POA: Diagnosis not present

## 2018-09-30 ENCOUNTER — Encounter: Payer: Self-pay | Admitting: Family Medicine

## 2018-09-30 MED FILL — CARBIDOPA/LEVO 25/100 TAB: 25-100 | 90 days supply | Qty: 450 | Fill #1

## 2018-10-02 ENCOUNTER — Ambulatory Visit: Payer: Medicare Other | Admitting: Family Medicine

## 2018-10-06 NOTE — Progress Notes (Signed)
Virtual Visit via Video Note The purpose of this virtual visit is to provide medical care while limiting exposure to the novel coronavirus.    Consent was obtained for video visit:  Yes.   Answered questions that patient had about telehealth interaction:  Yes.   I discussed the limitations, risks, security and privacy concerns of performing an evaluation and management service by telemedicine. I also discussed with the patient that there may be a patient responsible charge related to this service. The patient expressed understanding and agreed to proceed.  Pt location: Home Physician Location: office Name of referring provider:  Mosie Lukes, MD I connected with Vincent Walker at patients initiation/request on 10/09/2018 at  2:00 PM EDT by video enabled telemedicine application and verified that I am speaking with the correct person using two identifiers. Pt MRN:  [id number] Pt DOB:  [birth year] Video Participants:  Vincent Walker;  Wife supplements history   History of Present Illness:  Patient seen today in follow-up for PD.  He is on carbidopa/levodopa 25/100, 2 tablets in the morning (8am), 1 in the afternoon (1pm) and 1 in the evening (6-8pm) and carbidopa/levodopa 50/200 at bedtime.  Finding that daytime levodopa makes him sleepy.  Having more RLS at night.  Was controlled with this in the past but not now.  He will awaken in the middle of the night with it, and then take an extra carbidopa/levodopa IR and that helps but he thinks that it gives him hangover effect.  Still on the klonopin, 1/2 tablet at night.  Pt denies falls but thinks that off balance more.  He is not doing formal exercise but is doing yard work.  Pt denies lightheadedness, near syncope.  No hallucinations but had some visual distortions.  Has been to the eye doctor and was told that has floaters and aren't sure if the visual distortions are floaters or not.    I added Depakote ER, 250 mg last visit for mood.  Reports that  he is still "a little bit down" but having increasing stress with the floaters and with recent colonoscopy requiring polyp removal and now has to have it redone to have a bigger polyp removed.  I also sent a referral for counseling, but I got a note back that the patient refused to schedule when they called.  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  He reports that it helped but not the restlessness in legs as much.  Medical records have been reviewed since last visit.  He was in the emergency room in April with epistaxis, but the bleeding was controlled by the time he got to the emergency room.  Prior medications: Sertraline (felt more depressed); Lexapro (no help)   Current Outpatient Medications on File Prior to Visit  Medication Sig Dispense Refill   acetaminophen (TYLENOL) 500 MG tablet Take 500 mg by mouth every 6 (six) hours as needed.     amLODipine (NORVASC) 5 MG tablet Take 0.5 tablets (2.5 mg total) by mouth 2 (two) times daily. 30 tablet 5   aspirin 81 MG tablet Take 81 mg by mouth daily.     atenolol (TENORMIN) 100 MG tablet TAKE 1/2 TABLET BY MOUTH DAILY 45 tablet 2   atorvastatin (LIPITOR) 20 MG tablet TAKE 1 TABLET BY MOUTH DAILY 90 tablet 1   carbidopa-levodopa (SINEMET CR) 50-200 MG tablet TAKE 1 TABLET AT BEDTIME BY MOUTH. 90 tablet 1   carbidopa-levodopa (  SINEMET IR) 25-100 MG tablet TAKE 2 TABLETS PO IN THE MORNING, 1 TABLET IN THE AFTERNOON, 1 TABLET IN THE EVENING,  1 EXTRA TABLET PRN 450 tablet 1   clonazePAM (KLONOPIN) 0.5 MG tablet Take 0.5 tablets (0.25 mg total) by mouth at bedtime. 45 tablet 1   divalproex (DEPAKOTE ER) 250 MG 24 hr tablet Take 1 tablet (250 mg total) by mouth daily. 90 tablet 1   latanoprost (XALATAN) 0.005 % ophthalmic solution PLACE 1 DROP INTO BOTH EYES NIGHTLY.     Multiple Vitamins-Minerals (CENTRUM SILVER PO) Take by mouth daily.     oxymetazoline (AFRIN NASAL SPRAY) 0.05 % nasal spray  Place 1 spray into both nostrils 2 (two) times daily. 30 mL 0   sodium chloride (OCEAN) 0.65 % SOLN nasal spray Place 1 spray into both nostrils as needed for congestion. 88 mL 2   No current facility-administered medications on file prior to visit.      Observations/Objective:   There were no vitals filed for this visit. GEN:  The patient appears stated age and is in NAD.  Neurological examination:  Orientation: The patient is alert and oriented x3. Cranial nerves: There is good facial symmetry. There is no facial hypomimia.  The speech is fluent and clear. Soft palate rises symmetrically and there is no tongue deviation. Hearing is intact to conversational tone. Motor: Strength is at least antigravity x 4.   Shoulder shrug is equal and symmetric.  There is no pronator drift.  Movement examination: Tone: unable Abnormal movements: none seen Coordination:  There is mild decremation with RAM's, with finger taps on the right. Gait and Station: The patient walks well with good arm swing in the home.      Assessment and Plan:   1.  Idiopathic Parkinson's disease, diagnosed August, 2017.                -We discussed the diagnosis as well as pathophysiology of the disease.  We discussed treatment options as well as prognostic indicators.  Patient education was provided.             -Patient will d/c carbidopa/levodopa 25/100, 2/1/1 IR and change to carbidopa/levodopa 25/100 CR, 2/1/1.                -continue carbidopa/levodopa 50/200 1 hour prior to bedtime   -c/o near falls but refuses PT.  Will let me know   2.  RLS             -continue carbidopa/levodopa 50/200 at bedtime  -increase klonopin, 0.5 mg q hs.  Risks, benefits, side effects and alternative therapies were discussed.  The opportunity to ask questions was given and they were answered to the best of my ability.  The patient expressed understanding and willingness to follow the outlined treatment protocols.  3.   Depression and anxiety             -Can be associated with Parkinson's disease.               -stop lexapro - doesn't seem to be helping.               -Depakote ER, 250 mg.  Pt may need an increased dose but wants to wait until has other stressors resolved.               -Declined counseling  4.  RBD and insomnia             -increase  Clonazepam 0.5 mg, 1 pill at night.    Follow Up Instructions:  5 months   -I discussed the assessment and treatment plan with the patient. The patient was provided an opportunity to ask questions and all were answered. The patient agreed with the plan and demonstrated an understanding of the instructions.   The patient was advised to call back or seek an in-person evaluation if the symptoms worsen or if the condition fails to improve as anticipated.    Total Time spent in visit with the patient was:  25 min, of which more than 50% of the time was spent in counseling.   Pt understands and agrees with the plan of care outlined.     Alonza Bogus, DO

## 2018-10-07 ENCOUNTER — Other Ambulatory Visit: Payer: Self-pay

## 2018-10-07 ENCOUNTER — Ambulatory Visit (INDEPENDENT_AMBULATORY_CARE_PROVIDER_SITE_OTHER): Payer: Medicare Other | Admitting: Gastroenterology

## 2018-10-07 VITALS — Ht 75.0 in | Wt 235.0 lb

## 2018-10-07 DIAGNOSIS — Z86010 Personal history of colonic polyps: Secondary | ICD-10-CM | POA: Diagnosis not present

## 2018-10-07 DIAGNOSIS — D122 Benign neoplasm of ascending colon: Secondary | ICD-10-CM

## 2018-10-07 MED ORDER — PEG 3350-KCL-NA BICARB-NACL 420 G PO SOLR: 4000.0000 mL | ORAL | 0 refills | Status: DC

## 2018-10-07 MED FILL — PEG-3350 SOLUTION: 420 | 1 days supply | Qty: 4000 | Fill #0

## 2018-10-07 NOTE — Progress Notes (Signed)
[facility] VISIT   Primary Care Provider Mosie Lukes, MD [address] [place] [address] [phone]  Referring Provider Dr. Lyndel Safe  Patient Profile: Vincent Walker is a 73 y.o. male with a pmh significant for hypertension, prior renal cancer, chronic renal insufficiency, hyperlipidemia, glaucoma, status post appendectomy, colon polyps.  The patient presents to the [facility] for an evaluation and management of problem(s) noted below:  Problem List 1. Adenomatous polyp of ascending colon   2. Hx of colonic polyps    I connected with  [name] on 10/07/18. I verified that I was speaking with the correct person using two identifiers. Due to the COVID-19 Pandemic, this service was provided via telemedicine using Audio/Visual Media. The patient was located at home. The provider was located in the office. The patient did consent to this visit and is aware of charges through their insurance as well as the limitations of evaluation and management by telemedicine. Other persons participating in this telemedicine service were patient's wife who was with patient on the phone. Time spent on visit was greater than 25 minutes in discussion and coordination of care.   History of Present Illness This is a patient that was referred by Dr. Lyndel Safe to discuss repeat colonoscopy for completion colonoscopy as well as attempted EMR of an advanced polyp.  He recently underwent a colonoscopy in June 2020 where he was found to have a very redundant colon as well as multiple polyps.  A large polyp was found in the ascending colon.  This was not resected due to scope positioning and size and would require evaluation in the hospital-based setting for removal.  Patient describes no significant GI symptoms.  There is a report of a prior colonoscopy within 7 to 10 years suggesting that he had polyps that would require follow-up thus the reasoning  for his follow-up before 10 years.  The patient is on no significant blood thinners but does take a baby aspirin daily.  GI Review of Systems Positive as above Negative for dysphagia, odynophagia, pyrosis, change in appetite, abdominal pain, change in bowel habits, melena, hematochezia  Review of Systems General: Denies fevers/chills HEENT: Denies oral lesions Cardiovascular: Denies chest pain Pulmonary: Denies shortness of breath Gastroenterological: See HPI Genitourinary: Denies darkened urine Hematological: Denies easy bruising/bleeding Dermatological: Denies jaundice Psychological: Mood is stable   Medications Current Outpatient Medications  Medication Sig Dispense Refill   acetaminophen (TYLENOL) 500 MG tablet Take 500 mg by mouth every 6 (six) hours as needed.     amLODipine (NORVASC) 5 MG tablet Take 0.5 tablets (2.5 mg total) by mouth 2 (two) times daily. 30 tablet 5   aspirin 81 MG tablet Take 81 mg by mouth daily.     atenolol (TENORMIN) 100 MG tablet TAKE 1/2 TABLET BY MOUTH DAILY 45 tablet 2   atorvastatin (LIPITOR) 20 MG tablet TAKE 1 TABLET BY MOUTH DAILY 90 tablet 1   carbidopa-levodopa (SINEMET CR) 50-200 MG tablet TAKE 1 TABLET AT BEDTIME BY MOUTH. 90 tablet 1   carbidopa-levodopa (SINEMET IR) 25-100 MG tablet TAKE 2 TABLETS PO IN THE MORNING, 1 TABLET IN THE AFTERNOON, 1 TABLET IN THE EVENING,  1 EXTRA TABLET PRN 450 tablet 1   clonazePAM (KLONOPIN) 0.5 MG tablet Take 0.5 tablets (0.25 mg total) by mouth at bedtime. 45 tablet 1   divalproex (DEPAKOTE ER) 250 MG 24 hr tablet Take 1 tablet (250 mg total) by mouth daily. 90 tablet  1   latanoprost (XALATAN) 0.005 % ophthalmic solution PLACE 1 DROP INTO BOTH EYES NIGHTLY.     Multiple Vitamins-Minerals (CENTRUM SILVER PO) Take by mouth daily.     oxymetazoline (AFRIN NASAL SPRAY) 0.05 % nasal spray Place 1 spray into both nostrils 2 (two) times daily. 30 mL 0   polyethylene glycol-electrolytes  (NULYTELY/GOLYTELY) 420 g solution Take 4,000 mLs by mouth as directed. 4000 mL 0   sodium chloride (OCEAN) 0.65 % SOLN nasal spray Place 1 spray into both nostrils as needed for congestion. 88 mL 2   No current facility-administered medications for this visit.     Allergies Allergies  Allergen Reactions   Nsaids    Sulfa Antibiotics Other (See Comments)    Had a reaction as a child   Tolmetin Other (See Comments)    Histories Past Medical History:  Diagnosis Date   Anxiety state 12/23/2015   Benign essential HTN 02/08/2015   Cancer ([place])    Kidney Cancer   Chronic kidney disease    Chronic renal insufficiency 02/14/2015   Constipation 12/23/2015   Dyslipidemia 02/08/2015   Glaucoma    H/O renal cell carcinoma 02/08/2015   History of blood transfusion 2010   After Kidney surgery   History of chicken pox    Hyperglycemia 12/23/2015   Hyperlipidemia    Hypertension    Ingrown left big toenail 02/14/2015   Left foot pain 02/14/2015   Mitral valve prolapse    MVP (mitral valve prolapse) 05/14/2016   Neuromuscular disorder ([place])    Parkinson disease ([place]) 12/23/2015   Preventative health care 06/21/2016   Thrombocytopenia ([place]) 02/08/2015   Tremor of right hand 02/14/2015   Past Surgical History:  Procedure Laterality Date   APPENDECTOMY  1995   CATARACT EXTRACTION, BILATERAL     left knee scope  2003   right knee scope  1999   TOE SURGERY     TONSILLECTOMY     TOTAL KNEE ARTHROPLASTY Left 07/06/2014   TOTAL NEPHRECTOMY Right    Social History   Socioeconomic History   Marital status: Married    Spouse name: Not on file   Number of children: 3   Years of education: 16   Highest education level: Bachelor's degree (e.g., BA, AB, BS)  Occupational History   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  1996  Substance and Sexual Activity   Alcohol use: Yes    Alcohol/week: 0.0 standard drinks    Comment: rare/ social   Drug use: No   Sexual activity: Yes    Comment: lives with wife, retired from Naval architect in plant, no major dietary restrictions   Lifestyle   Physical activity    Days per week: 0 days    Minutes per session: 0 min   Stress: Not on file  Relationships   Social connections    Talks on phone: Not on file    Gets together: Not on file  Attends religious service: Not on file    Active member of club or organization: Not on file    Attends meetings of clubs or organizations: Not on file    Relationship status: Not on file   Intimate partner violence    Fear of current or ex partner: Not on file    Emotionally abused: Not on file    Physically abused: Not on file    Forced sexual activity: Not on file  Other Topics Concern   Not on file  Social History Narrative   Not on file   Family History  Problem Relation Age of Onset   Alcohol abuse Father    Hyperlipidemia Father    Hypertension Father    Diabetes Father    Heart disease Father    Cancer Maternal Aunt        lung cancer   Cancer Paternal Uncle        bone cancer   Heart attack Mother    Stroke Mother        swelling in brain stem   Healthy Daughter    Healthy Son    Healthy Daughter    Colon cancer Neg Hx    Esophageal cancer Neg Hx    Stomach cancer Neg Hx    Inflammatory bowel disease Neg Hx    Liver disease Neg Hx    Pancreatic cancer Neg Hx    Rectal cancer Neg Hx    I have reviewed his medical, social, and family history in detail and updated the electronic medical  record as necessary.    PHYSICAL EXAMINATION  Telemedicine visit   REVIEW OF DATA  I reviewed the following data at the time of this encounter:  GI Procedures and Studies  June 2020 colonoscopy -Large ascending colon polyp (biopsied) -Colonic polyps status post polypectomy. -Cecum not intubated.  Laboratory Studies  Reviewed in epic  Imaging Studies  No relevant studies to review   ASSESSMENT  Mr. Brassfield is a 73 y.o. male with a pmh significant for hypertension, prior renal cancer, chronic renal insufficiency, hyperlipidemia, glaucoma, status post appendectomy, colon polyps.  The patient is seen today for evaluation and management of:  1. Adenomatous polyp of ascending colon   2. Hx of colonic polyps    The patient is hemodynamically and clinically stable at this point in time.  Based upon the description and endoscopic pictures I do feel that it is reasonable to pursue an Advanced Polypectomy attempt of the polyp/lesion.  We also need to complete his full colonoscopy.  My intention would be to use a water immersion technique to try and optimize ability to enter cecum but will also have longer scopes available if necessary at the hospital based setting.  We discussed some of the techniques of advanced polypectomy which include Endoscopic Mucosal Resection, OVESCO Full-Thickness Resection, Endorotor Morcellation, and Tissue Ablation via Fulguration.  The risks and benefits of endoscopic evaluation were discussed with the patient; these include but are not limited to the risk of perforation, infection, bleeding, missed lesions, lack of diagnosis, severe illness requiring hospitalization, as well as anesthesia and sedation related illnesses.  During attempts at advanced polypectomy, the risks of bleeding and perforation/leak are increased as opposed to diagnostic and screening colonoscopies, and that was discussed with the patient as well.   In addition, I explained that with the possible  need for piecemeal resection, subsequent short-interval endoscopic evaluation for follow up and potential retreatment of the lesion/area may be necessary.  I did offer, a referral to surgery in order for patient to have opportunity to discuss surgical management/intervention prior to finalizing decision for attempt at endoscopic removal, however, the patient deferred on this.  If, after attempt at removal of the polyp, it is found that the patient has a complication or that an invasive lesion or malignant lesion is found, or that the polyp continues to recur, the patient is aware and understands that surgery may still be indicated/required.  All patient questions were answered, to the best of my ability, and the patient agrees to the aforementioned plan of action with follow-up as indicated.   PLAN  Laboratories as outlined below within 6 weeks of procedure Proceed with scheduling colonoscopy with EMR slot for 2 hours    Orders Placed This Encounter  Procedures   Protime-INR   Ambulatory referral to Gastroenterology    New Prescriptions   POLYETHYLENE GLYCOL-ELECTROLYTES (NULYTELY/GOLYTELY) 420 G SOLUTION    Take 4,000 mLs by mouth as directed.   Modified Medications   No medications on file    Planned Follow Up No follow-ups on file.   [name], MD [facility] Advanced Endoscopy Office # [phone]

## 2018-10-07 NOTE — Patient Instructions (Signed)
You have been scheduled for a colonoscopy/EMR at [facility]. Please follow written instructions given to you at your visit today.  Please pick up your prep supplies at the pharmacy within the next 1-3 days. If you use inhalers (even only as needed), please bring them with you on the day of your procedure. Your physician has requested that you go to www.startemmi.com and enter the access code given to you at your visit today. This web site gives a general overview about your procedure. However, you should still follow specific instructions given to you by our office regarding your preparation for the procedure.  Your provider has requested that you go to the basement level for lab work on 11/13/18. Press "B" on the elevator. The lab is located at the first door on the left as you exit the elevator.  Thank you for entrusting me with your care and choosing [facility] care.  Dr Rush Landmark

## 2018-10-09 ENCOUNTER — Ambulatory Visit: Payer: Medicare Other | Admitting: Neurology

## 2018-10-09 ENCOUNTER — Telehealth (INDEPENDENT_AMBULATORY_CARE_PROVIDER_SITE_OTHER): Payer: Medicare Other | Admitting: Neurology

## 2018-10-09 ENCOUNTER — Encounter: Payer: Self-pay | Admitting: Neurology

## 2018-10-09 ENCOUNTER — Other Ambulatory Visit: Payer: Self-pay

## 2018-10-09 ENCOUNTER — Encounter: Payer: Self-pay | Admitting: Gastroenterology

## 2018-10-09 VITALS — Ht 74.0 in | Wt 235.0 lb

## 2018-10-09 DIAGNOSIS — G2581 Restless legs syndrome: Secondary | ICD-10-CM

## 2018-10-09 DIAGNOSIS — G4752 REM sleep behavior disorder: Secondary | ICD-10-CM | POA: Diagnosis not present

## 2018-10-09 DIAGNOSIS — G20 Parkinson's disease: Secondary | ICD-10-CM | POA: Diagnosis not present

## 2018-10-09 DIAGNOSIS — G20A1 Parkinson's disease without dyskinesia, without mention of fluctuations: Secondary | ICD-10-CM

## 2018-10-09 DIAGNOSIS — D122 Benign neoplasm of ascending colon: Secondary | ICD-10-CM | POA: Insufficient documentation

## 2018-10-09 HISTORY — DX: Benign neoplasm of ascending colon: D12.2

## 2018-10-09 MED ORDER — CARBIDOPA-LEVODOPA ER 50-200 MG PO TBCR: 1.0000 | EXTENDED_RELEASE_TABLET | Freq: Every day | ORAL | 1 refills | Status: DC

## 2018-10-09 MED ORDER — CARBIDOPA-LEVODOPA ER 25-100 MG PO TBCR: EXTENDED_RELEASE_TABLET | ORAL | 1 refills | Status: DC

## 2018-10-09 MED FILL — CARBIDOPA-LEVO ER 25-100 TA: 25-100 | 90 days supply | Qty: 360 | Fill #0

## 2018-10-09 MED FILL — CARBIDOPA-LEVO ER 50-200 TA: 50-200 | 90 days supply | Qty: 90 | Fill #0

## 2018-10-23 DIAGNOSIS — N400 Enlarged prostate without lower urinary tract symptoms: Secondary | ICD-10-CM | POA: Diagnosis not present

## 2018-10-23 DIAGNOSIS — Z905 Acquired absence of kidney: Secondary | ICD-10-CM | POA: Diagnosis not present

## 2018-10-23 DIAGNOSIS — C649 Malignant neoplasm of unspecified kidney, except renal pelvis: Secondary | ICD-10-CM | POA: Diagnosis not present

## 2018-10-23 DIAGNOSIS — C641 Malignant neoplasm of right kidney, except renal pelvis: Secondary | ICD-10-CM | POA: Diagnosis not present

## 2018-10-23 DIAGNOSIS — N183 Chronic kidney disease, stage 3 (moderate): Secondary | ICD-10-CM | POA: Diagnosis not present

## 2018-10-27 MED FILL — AMLODIPINE BESYLATE 5 MG TA: 5 | 30 days supply | Qty: 30 | Fill #4

## 2018-10-27 MED FILL — ATORVASTATIN 20 MG TABLET: 20 | 90 days supply | Qty: 90 | Fill #1

## 2018-10-28 ENCOUNTER — Other Ambulatory Visit: Payer: Self-pay | Admitting: Neurology

## 2018-10-29 MED FILL — clonazePAM 0.5 MG TABS: 0.5 | 30 days supply | Qty: 30 | Fill #0

## 2018-10-29 NOTE — Telephone Encounter (Signed)
Requested Prescriptions   Pending Prescriptions Disp Refills  . clonazePAM (KLONOPIN) 0.5 MG tablet [Pharmacy Med Name: clonazePAM 0.5 MG TABS 0.5 TAB] 45 tablet 1    Sig: TAKE 1/2 TABLET (0.25 MG TOTAL) BY MOUTH AT BEDTIME.   Rx last filled:06/06/18 #45 1 refills  Pt last seen:10/09/18  Follow up appt scheduled:03/12/19

## 2018-10-29 NOTE — Telephone Encounter (Signed)
Provider approved 

## 2018-11-13 ENCOUNTER — Other Ambulatory Visit (HOSPITAL_COMMUNITY): Payer: Medicare Other

## 2018-11-24 ENCOUNTER — Other Ambulatory Visit: Payer: Self-pay | Admitting: Neurology

## 2018-11-24 MED FILL — ATENOLOL 100 MG TAB: 100 | 90 days supply | Qty: 45 | Fill #1

## 2018-11-24 MED FILL — clonazePAM 0.5 MG TABS: 0.5 | 30 days supply | Qty: 30 | Fill #1

## 2018-11-24 MED FILL — DIVALPROEX SOD ER 250 MG TA: 250 | 90 days supply | Qty: 90 | Fill #0

## 2018-11-24 NOTE — Telephone Encounter (Signed)
Requested Prescriptions   Pending Prescriptions Disp Refills  . divalproex (DEPAKOTE ER) 250 MG 24 hr tablet [Pharmacy Med Name: DIVALPROEX SOD ER 250 MG TA 250 TAB] 90 tablet 1    Sig: TAKE 1 TABLET (250 MG TOTAL) BY MOUTH DAILY.   Rx last filled: 06/06/18 #90 1 refills  Pt last seen:10/09/18  Follow up appt scheduled:03/12/19

## 2018-12-12 ENCOUNTER — Other Ambulatory Visit (INDEPENDENT_AMBULATORY_CARE_PROVIDER_SITE_OTHER): Payer: Medicare Other

## 2018-12-12 DIAGNOSIS — Z86010 Personal history of colonic polyps: Secondary | ICD-10-CM

## 2018-12-12 LAB — PROTIME-INR
INR: 1.1 ratio — ABNORMAL HIGH (ref 0.8–1.0)
Prothrombin Time: 12.3 s (ref 9.6–13.1)

## 2018-12-13 ENCOUNTER — Other Ambulatory Visit (HOSPITAL_COMMUNITY)
Admission: RE | Admit: 2018-12-13 | Discharge: 2018-12-13 | Disposition: A | Discharge status: Home / Self Care | Payer: Medicare Other | Source: Ambulatory Visit | Attending: Gastroenterology | Admitting: Gastroenterology

## 2018-12-13 DIAGNOSIS — Z20828 Contact with and (suspected) exposure to other viral communicable diseases: Secondary | ICD-10-CM | POA: Diagnosis not present

## 2018-12-13 DIAGNOSIS — Z01812 Encounter for preprocedural laboratory examination: Secondary | ICD-10-CM | POA: Diagnosis not present

## 2018-12-14 LAB — NOVEL CORONAVIRUS, NAA (HOSP ORDER, SEND-OUT TO REF LAB; TAT 18-24 HRS): SARS-CoV-2, NAA: NOT DETECTED

## 2018-12-15 MED FILL — AMLODIPINE BESYLATE 5 MG TA: 5 | 30 days supply | Qty: 30 | Fill #5

## 2018-12-16 ENCOUNTER — Telehealth: Payer: Self-pay | Admitting: Gastroenterology

## 2018-12-16 ENCOUNTER — Other Ambulatory Visit: Payer: Self-pay

## 2018-12-16 ENCOUNTER — Encounter (HOSPITAL_COMMUNITY): Payer: Self-pay | Admitting: *Deleted

## 2018-12-16 NOTE — Progress Notes (Signed)
Vincent Walker asked me to speak to Vincent Walker.  Vincent Walker is hoh.  Vincent Walker tested negative for Covid 9/14 and has been in quarantine with his wife since then. Vincent Walker said that they were told to arrive at 6:30 for an 8:00 case. I told her we have arrive at 7:30 am for a 9:00 am case. Vincent Walker is calling Dr. Donneta Romberg  office to see. "Maybe he will not have to get up so early to finish the prep."

## 2018-12-16 NOTE — Progress Notes (Signed)
Spoke with pt who states that he has remained in quarantine and is not experiencing and s/s of COVID since his screening. All questions answered appropriately. Jobe Igo, RN

## 2018-12-16 NOTE — Anesthesia Preprocedure Evaluation (Addendum)
Anesthesia Evaluation  Patient identified by MRN, date of birth, ID band Patient awake    Reviewed: Allergy & Precautions, NPO status , Patient's Chart, lab work & pertinent test results  History of Anesthesia Complications Negative for: history of anesthetic complications  Airway Mallampati: II  TM Distance: >3 FB Neck ROM: Full    Dental no notable dental hx.    Pulmonary former smoker,    Pulmonary exam normal        Cardiovascular hypertension, Pt. on medications Normal cardiovascular exam     Neuro/Psych Anxiety Depression Parkinson's dx negative psych ROS   GI/Hepatic negative GI ROS, Neg liver ROS,   Endo/Other  negative endocrine ROS  Renal/GU Renal InsufficiencyRenal diseaseRenal ca  negative genitourinary   Musculoskeletal negative musculoskeletal ROS (+)   Abdominal   Peds  Hematology negative hematology ROS (+)   Anesthesia Other Findings Day of surgery medications reviewed with patient.  Reproductive/Obstetrics negative OB ROS                            Anesthesia Physical Anesthesia Plan  ASA: II  Anesthesia Plan: MAC   Post-op Pain Management:    Induction:   PONV Risk Score and Plan: 1 and Treatment may vary due to age or medical condition and Propofol infusion  Airway Management Planned: Natural Airway and Nasal Cannula  Additional Equipment:   Intra-op Plan:   Post-operative Plan:   Informed Consent: I have reviewed the patients History and Physical, chart, labs and discussed the procedure including the risks, benefits and alternatives for the proposed anesthesia with the patient or authorized representative who has indicated his/her understanding and acceptance.     Dental advisory given  Plan Discussed with: CRNA  Anesthesia Plan Comments:        Anesthesia Quick Evaluation

## 2018-12-16 NOTE — Telephone Encounter (Signed)
I spoke with the pt's wife and we discussed instructions at length and the pt's wife verbalized understanding.

## 2018-12-17 ENCOUNTER — Ambulatory Visit (HOSPITAL_COMMUNITY)
Admission: RE | Admit: 2018-12-17 | Discharge: 2018-12-17 | Disposition: A | Discharge status: Home / Self Care | Payer: Medicare Other | Attending: Gastroenterology | Admitting: Gastroenterology

## 2018-12-17 ENCOUNTER — Encounter (HOSPITAL_COMMUNITY)
Admission: RE | Disposition: A | Discharge status: Home / Self Care | Payer: Self-pay | Source: Home / Self Care | Attending: Gastroenterology

## 2018-12-17 ENCOUNTER — Ambulatory Visit (HOSPITAL_COMMUNITY): Payer: Medicare Other | Admitting: Certified Registered Nurse Anesthetist

## 2018-12-17 ENCOUNTER — Other Ambulatory Visit: Payer: Self-pay

## 2018-12-17 ENCOUNTER — Encounter (HOSPITAL_COMMUNITY): Payer: Self-pay | Admitting: Gastroenterology

## 2018-12-17 DIAGNOSIS — E785 Hyperlipidemia, unspecified: Secondary | ICD-10-CM | POA: Diagnosis not present

## 2018-12-17 DIAGNOSIS — I341 Nonrheumatic mitral (valve) prolapse: Secondary | ICD-10-CM | POA: Insufficient documentation

## 2018-12-17 DIAGNOSIS — Z905 Acquired absence of kidney: Secondary | ICD-10-CM | POA: Diagnosis not present

## 2018-12-17 DIAGNOSIS — F419 Anxiety disorder, unspecified: Secondary | ICD-10-CM | POA: Insufficient documentation

## 2018-12-17 DIAGNOSIS — Z85528 Personal history of other malignant neoplasm of kidney: Secondary | ICD-10-CM | POA: Diagnosis not present

## 2018-12-17 DIAGNOSIS — K5730 Diverticulosis of large intestine without perforation or abscess without bleeding: Secondary | ICD-10-CM | POA: Diagnosis not present

## 2018-12-17 DIAGNOSIS — Z86010 Personal history of colonic polyps: Secondary | ICD-10-CM | POA: Insufficient documentation

## 2018-12-17 DIAGNOSIS — K644 Residual hemorrhoidal skin tags: Secondary | ICD-10-CM | POA: Insufficient documentation

## 2018-12-17 DIAGNOSIS — H409 Unspecified glaucoma: Secondary | ICD-10-CM | POA: Insufficient documentation

## 2018-12-17 DIAGNOSIS — Z7982 Long term (current) use of aspirin: Secondary | ICD-10-CM | POA: Insufficient documentation

## 2018-12-17 DIAGNOSIS — D122 Benign neoplasm of ascending colon: Secondary | ICD-10-CM | POA: Insufficient documentation

## 2018-12-17 DIAGNOSIS — Z882 Allergy status to sulfonamides status: Secondary | ICD-10-CM | POA: Insufficient documentation

## 2018-12-17 DIAGNOSIS — G20 Parkinson's disease: Secondary | ICD-10-CM | POA: Diagnosis not present

## 2018-12-17 DIAGNOSIS — G709 Myoneural disorder, unspecified: Secondary | ICD-10-CM | POA: Diagnosis not present

## 2018-12-17 DIAGNOSIS — I129 Hypertensive chronic kidney disease with stage 1 through stage 4 chronic kidney disease, or unspecified chronic kidney disease: Secondary | ICD-10-CM | POA: Diagnosis not present

## 2018-12-17 DIAGNOSIS — Z886 Allergy status to analgesic agent status: Secondary | ICD-10-CM | POA: Insufficient documentation

## 2018-12-17 DIAGNOSIS — N189 Chronic kidney disease, unspecified: Secondary | ICD-10-CM | POA: Insufficient documentation

## 2018-12-17 DIAGNOSIS — Z87891 Personal history of nicotine dependence: Secondary | ICD-10-CM | POA: Insufficient documentation

## 2018-12-17 DIAGNOSIS — F418 Other specified anxiety disorders: Secondary | ICD-10-CM | POA: Diagnosis not present

## 2018-12-17 DIAGNOSIS — D120 Benign neoplasm of cecum: Secondary | ICD-10-CM | POA: Insufficient documentation

## 2018-12-17 DIAGNOSIS — M1990 Unspecified osteoarthritis, unspecified site: Secondary | ICD-10-CM | POA: Diagnosis not present

## 2018-12-17 DIAGNOSIS — Z79899 Other long term (current) drug therapy: Secondary | ICD-10-CM | POA: Insufficient documentation

## 2018-12-17 DIAGNOSIS — F329 Major depressive disorder, single episode, unspecified: Secondary | ICD-10-CM | POA: Insufficient documentation

## 2018-12-17 DIAGNOSIS — Z888 Allergy status to other drugs, medicaments and biological substances status: Secondary | ICD-10-CM | POA: Insufficient documentation

## 2018-12-17 HISTORY — PX: COLONOSCOPY WITH PROPOFOL: SHX5780

## 2018-12-17 HISTORY — PX: SUBMUCOSAL LIFTING INJECTION: SHX6855

## 2018-12-17 HISTORY — DX: Unspecified osteoarthritis, unspecified site: M19.90

## 2018-12-17 HISTORY — PX: POLYPECTOMY: SHX5525

## 2018-12-17 HISTORY — PX: HEMOSTASIS CLIP PLACEMENT: SHX6857

## 2018-12-17 HISTORY — DX: Depression, unspecified: F32.A

## 2018-12-17 HISTORY — PX: ENDOSCOPIC MUCOSAL RESECTION: SHX6839

## 2018-12-17 SURGERY — COLONOSCOPY WITH PROPOFOL
Anesthesia: Monitor Anesthesia Care

## 2018-12-17 MED ORDER — SODIUM CHLORIDE 0.9 % IV SOLN: INTRAVENOUS | Status: DC

## 2018-12-17 MED ORDER — PROPOFOL 500 MG/50ML IV EMUL
INTRAVENOUS | Status: DC | PRN
  Administered 2018-12-17: 100 ug/kg/min via INTRAVENOUS

## 2018-12-17 MED ORDER — LACTATED RINGERS IV SOLN
INTRAVENOUS | Status: DC
  Administered 2018-12-17: 10:00:00 via INTRAVENOUS

## 2018-12-17 MED ORDER — PHENYLEPHRINE 40 MCG/ML (10ML) SYRINGE FOR IV PUSH (FOR BLOOD PRESSURE SUPPORT)
PREFILLED_SYRINGE | INTRAVENOUS | Status: DC | PRN
  Administered 2018-12-17: 120 ug via INTRAVENOUS
  Administered 2018-12-17 (×2): 80 ug via INTRAVENOUS

## 2018-12-17 SURGICAL SUPPLY — 21 items

## 2018-12-17 NOTE — Anesthesia Procedure Notes (Signed)
Procedure Name: MAC Date/Time: 12/17/2018 9:58 AM Performed by: Barrington Ellison, CRNA Pre-anesthesia Checklist: Patient identified, Emergency Drugs available, Suction available, Patient being monitored and Timeout performed Patient Re-evaluated:Patient Re-evaluated prior to induction Oxygen Delivery Method: Nasal cannula

## 2018-12-17 NOTE — Transfer of Care (Signed)
Immediate Anesthesia Transfer of Care Note  Patient: Vincent Walker  Procedure(s) Performed: COLONOSCOPY WITH PROPOFOL (N/A ) ENDOSCOPIC MUCOSAL RESECTION (N/A ) POLYPECTOMY SUBMUCOSAL LIFTING INJECTION HEMOSTASIS CLIP PLACEMENT  Patient Location: Endoscopy Unit  Anesthesia Type:MAC  Level of Consciousness: drowsy  Airway & Oxygen Therapy: Patient Spontanous Breathing  Post-op Assessment: Report given to RN  Post vital signs: Reviewed and stable  Last Vitals:  Vitals Value Taken Time  BP 124/80 12/17/18 1242  Temp    Pulse 55 12/17/18 1242  Resp 15 12/17/18 1242  SpO2 99 % 12/17/18 1242    Last Pain:  Vitals:   12/17/18 1242  TempSrc: Temporal  PainSc: Asleep         Complications: No apparent anesthesia complications

## 2018-12-17 NOTE — Discharge Instructions (Signed)
HOLD ASPIRIN FOR 2-weeks.

## 2018-12-17 NOTE — H&P (Signed)
GASTROENTEROLOGY PROCEDURE H&P NOTE   Primary Care Physician: Vincent Lukes, MD  HPI: Vincent Walker is a 73 y.o. male who presents for Colonoscopy with attempt at EMR.  Past Medical History:  Diagnosis Date  . Anxiety state 12/23/2015  . Arthritis   . Benign essential HTN 02/08/2015  . Cancer [facility])    Kidney Cancer  . Chronic kidney disease   . Chronic renal insufficiency 02/14/2015  . Constipation 12/23/2015  . Depression   . Dyslipidemia 02/08/2015  . Glaucoma   . H/O renal cell carcinoma 02/08/2015  . History of blood transfusion 2010   After Kidney surgery  . History of chicken pox   . Hyperglycemia 12/23/2015  . Hyperlipidemia   . Hypertension   . Ingrown left big toenail 02/14/2015  . Left foot pain 02/14/2015  . Mitral valve prolapse   . MVP (mitral valve prolapse) 05/14/2016  . Neuromuscular disorder ([place])   . Parkinson disease ([place]) 12/23/2015  . Preventative health care 06/21/2016  . Thrombocytopenia ([place]) 02/08/2015  . Tremor of right hand 02/14/2015   Past Surgical History:  Procedure Laterality Date  . APPENDECTOMY  1995  . CATARACT EXTRACTION, BILATERAL    . COLONOSCOPY    . left knee scope  2003  . right knee scope  1999  . TOE SURGERY Left    metal 2nd toe- and top of foot- straighten bone  . TONSILLECTOMY    . TOTAL KNEE ARTHROPLASTY Left 07/06/2014  . TOTAL NEPHRECTOMY Right    Current Facility-Administered Medications  Medication Dose Route Frequency Provider Last Rate Last Dose  . 0.9 %  sodium chloride infusion   Intravenous Continuous Mansouraty, Telford Nab., MD       Allergies  Allergen Reactions  . Nsaids   . Sulfa Antibiotics Other (See Comments)    Had a reaction as a child  . Tolmetin Other (See Comments)   Family History  Problem Relation Age of Onset  . Alcohol abuse Father   . Hyperlipidemia Father   . Hypertension Father   . Diabetes Father   . Heart disease Father   . Cancer Maternal Aunt        lung cancer  . Cancer  Paternal Uncle        bone cancer  . Heart attack Mother   . Stroke Mother        swelling in brain stem  . Healthy Daughter   . Healthy Son   . Healthy Daughter   . Colon cancer Neg Hx   . Esophageal cancer Neg Hx   . Stomach cancer Neg Hx   . Inflammatory bowel disease Neg Hx   . Liver disease Neg Hx   . Pancreatic cancer Neg Hx   . Rectal cancer Neg Hx    Social History   Socioeconomic History  . Marital status: Married    Spouse name: Not on file  . Number of children: 3  . Years of education: 16  . Highest education level: Bachelor's degree (e.g., BA, AB, BS)  Occupational History  . Occupation: retired Freight forwarder in [place]  . Financial resource strain: Not on file  . Food insecurity    Worry: Not on file    Inability: Not on file  . Transportation needs    Medical: Not on file    Non-medical: Not on file  Tobacco Use  . Smoking status: Former Smoker    Types: Pipe    Quit date:  11/18/1995    Years since quitting: 23.0  . Smokeless tobacco: Never Used  . Tobacco comment: stopped 20 years ago.  1996  Substance and Sexual Activity  . Alcohol use: Yes    Alcohol/week: 0.0 standard drinks    Comment: rare/ social  . Drug use: No  . 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 Physical activity    Days per week: 0 days    Minutes per session: 0 min  . Stress: Not on file  Relationships  . Social Herbalist on phone: Not on file    Gets together: Not on file    Attends religious service: Not on file    Active member of club or organization: Not on file    Attends meetings of clubs or organizations: Not on file    Relationship status: Not on file  . Intimate partner violence    Fear of current or ex partner: Not on file    Emotionally abused: Not on file    Physically abused: Not on file    Forced sexual activity: Not on  file  Other Topics Concern  . Not on file  Social History Narrative  . Not on file    Physical Exam: Vital signs in last 24 hours: Weight:  [106.6 kg] 106.6 kg (09/15 1419)   GEN: NAD EYE: Sclerae anicteric ENT: MMM CV: Non-tachycardic GI: Soft, NT/ND NEURO:  Alert & Oriented x 3  Lab Results: No results for input(s): WBC, HGB, HCT, PLT in the last 72 hours. BMET No results for input(s): NA, K, CL, CO2, GLUCOSE, BUN, CREATININE, CALCIUM in the last 72 hours. LFT No results for input(s): PROT, ALBUMIN, AST, ALT, ALKPHOS, BILITOT, BILIDIR, IBILI in the last 72 hours. PT/INR No results for input(s): LABPROT, INR in the last 72 hours.   Impression / Plan: This is a 73 y.o.male who presents for Colonoscopy with attempt at EMR.  The risks and benefits of endoscopic evaluation were discussed with the patient; these include but are not limited to the risk of perforation, infection, bleeding, missed lesions, lack of diagnosis, severe illness requiring hospitalization, as well as anesthesia and sedation related illnesses.  The patient is agreeable to proceed.    [name], MD [facility] Advanced Endoscopy Office # [phone]

## 2018-12-17 NOTE — Op Note (Signed)
[facility] Patient Name: Vincent Walker Procedure Date : 12/17/2018 MRN: [id number] Attending MD: [name] , MD Date of Birth: [birth year] CSN: [id number] Age: 73 Admit Type: Outpatient Procedure:                Colonoscopy Indications:              Excision of colonic polyp Providers:                [name], MD, Jeanella Cara, RN,                            Lazaro Arms, Technician, Elspeth Cho Tech.,                            Technician, Sampson Si, CRNA Referring MD:             Jackquline Denmark, MD Medicines:                Monitored Anesthesia Care Complications:            No immediate complications. Estimated Blood Loss:     Estimated blood loss was minimal. Procedure:                Pre-Anesthesia Assessment:                           - Prior to the procedure, a History and Physical                            was performed, and patient medications and                            allergies were reviewed. The patient's tolerance of                            previous anesthesia was also reviewed. The risks                            and benefits of the procedure and the sedation                            options and risks were discussed with the patient.                            All questions were answered, and informed consent                            was obtained. Prior Anticoagulants: The patient has                            taken no previous anticoagulant or antiplatelet                            agents except for aspirin. ASA Grade Assessment:  III - A patient with severe systemic disease. After                            reviewing the risks and benefits, the patient was                            deemed in satisfactory condition to undergo the                            procedure.                           After obtaining informed consent, the colonoscope                            was passed under  direct vision. Throughout the                            procedure, the patient's blood pressure, pulse, and                            oxygen saturations were monitored continuously. The                            CF-HQ190L ([id number]) Olympus colonoscope was                            introduced through the anus and advanced to the the                            cecum, identified by appendiceal orifice and                            ileocecal valve. The colonoscopy was EXTREMELY                            difficult due to a redundant colon, significant                            looping and the patient's body habitus. Successful                            completion of the procedure was aided by changing                            the patient to a supine position, using manual                            pressure, withdrawing and reinserting the scope,                            straightening and shortening the scope to obtain  bowel loop reduction, using scope torsion and                            lavage. The patient tolerated the procedure. The                            quality of the bowel preparation was good. The                            ileocecal valve, appendiceal orifice, and rectum                            were photographed. Scope In: 10:09:26 AM Scope Out: 12:33:53 PM Scope Withdrawal Time: 2 hours 7 minutes 44 seconds  Total Procedure Duration: 2 hours 24 minutes 27 seconds  Findings:      Hemorrhoids were found on perianal exam.      Three sessile polyps were found in the ascending colon (1) and cecum       (2). The polyps were 1 to 2 mm in size. These polyps were removed with a       cold snare. Resection and retrieval were complete.      A 45 mm polyp was found in the proximal ascending colon. The polyp was       multi-lobulated and semi-sessile taking up 40-50% of the haustral fold       The. Preparations were made for mucosal resection.  Orise gel was       injected to raise the lesion. Piecemeal mucosal resection using a snare       was performed using the lift as well as water immersion to help lift the       polyp. Resection and retrieval were complete. Coagulation for tissue       destruction using snare tip to the edge of the resection site was       successful. To close the defect after mucosal resection, eight       hemostatic clips were successfully placed (MR conditional), the defect       was extremely large and entire closure due to location, scope       positioning and size was not possible. There was no bleeding at the end       of the procedure.      Multiple small-mouthed diverticula were found in the recto-sigmoid colon       and sigmoid colon.      The rest of the colon was not visualized in great detail on withdrawal       due to recent full colonoscopy but no gross large mucosal lesions were       noted. Will be re-evaluated at next colonoscopy. Impression:               - Hemorrhoids found on perianal exam.                           - Three 1 to 2 mm polyps in the ascending colon and                            in the cecum, removed with a cold snare. Resected  and retrieved.                           - One 45 mm polyp in the proximal ascending colon,                            removed with piecemeal mucosal resection. Resected                            and retrieved. Snare tip coagulation to edge. Clips                            (MR conditional) were placed to decrease risk of                            bleeding post-mucosectomy.                           - Diverticulosis in the recto-sigmoid colon and in                            the sigmoid colon. Recommendation:           - The patient will be observed post-procedure,                            until all discharge criteria are met.                           - Discharge patient to home.                           -  Patient has a contact number available for                            emergencies. The signs and symptoms of potential                            delayed complications were discussed with the                            patient. Return to normal activities tomorrow.                            Written discharge instructions were provided to the                            patient.                           - High fiber diet.                           - Hold Aspirin for next 2-weeks and no NSAIDs to  decrease risk of post-mucosectomy bleeding.                           - Await pathology results.                           - Repeat colonoscopy in 6 months for surveillance                            after piecemeal resection and be prepared for                            Endorotor v FTRD (need to schedule for at least                            2-2.5 hours.                           - The findings and recommendations were discussed                            with the patient.                           - The findings and recommendations were discussed                            with the patient's family. Procedure Code(s):        --- Professional ---                           [phone], Colonoscopy, flexible; with endoscopic                            mucosal resection                           [id number], 32, Colonoscopy, flexible; with removal of                            tumor(s), polyp(s), or other lesion(s) by snare                            technique Diagnosis Code(s):        --- Professional ---                           K63.5, Polyp of colon                           K64.9, Unspecified hemorrhoids                           K57.30, Diverticulosis of large intestine without                            perforation or abscess without bleeding CPT copyright 2019  American Medical Association. All rights reserved. The codes documented in this report are preliminary and upon  coder review may  be revised to meet current compliance requirements. [name], MD 12/17/2018 1:00:17 PM Number of Addenda: 0

## 2018-12-18 ENCOUNTER — Encounter (HOSPITAL_COMMUNITY): Payer: Self-pay | Admitting: Gastroenterology

## 2018-12-18 NOTE — Anesthesia Postprocedure Evaluation (Signed)
Anesthesia Post Note  Patient: Vincent Walker  Procedure(s) Performed: COLONOSCOPY WITH PROPOFOL (N/A ) ENDOSCOPIC MUCOSAL RESECTION (N/A ) POLYPECTOMY SUBMUCOSAL LIFTING INJECTION HEMOSTASIS CLIP PLACEMENT     Patient location during evaluation: PACU Anesthesia Type: MAC Level of consciousness: awake and alert and oriented Pain management: pain level controlled Vital Signs Assessment: post-procedure vital signs reviewed and stable Respiratory status: spontaneous breathing, nonlabored ventilation and respiratory function stable Cardiovascular status: blood pressure returned to baseline Postop Assessment: no apparent nausea or vomiting Anesthetic complications: no                 Brennan Bailey

## 2018-12-19 ENCOUNTER — Telehealth: Payer: Self-pay

## 2018-12-19 LAB — SURGICAL PATHOLOGY

## 2018-12-19 NOTE — Telephone Encounter (Signed)
The pt was contacted and he states he is doing very well, eating and drinking normally.  He will call if things change.

## 2018-12-19 NOTE — Telephone Encounter (Signed)
-----   Message from Irving Copas., MD sent at 12/18/2018  9:14 PM EDT ----- Merrie Roof, Colonoscopy complete.  Quite a colonoscopy" polyp.  It seemed like it kept coming and coming after each piecemeal resection.  I do think there is a degree of potential for recurrent polyp so we will see how things look in 6 months. Thank you for the referral. Barbi Kumagai, can you check in with the patient before the weekend to see how he is doing? Thanks. Chester Holstein

## 2018-12-19 NOTE — Telephone Encounter (Signed)
Thanks for the update. GM

## 2018-12-23 ENCOUNTER — Encounter: Payer: Self-pay | Admitting: Gastroenterology

## 2018-12-29 ENCOUNTER — Other Ambulatory Visit: Payer: Self-pay | Admitting: Neurology

## 2018-12-29 MED FILL — clonazePAM 0.5 MG TABS: 0.5 | 30 days supply | Qty: 30 | Fill #0

## 2018-12-29 MED FILL — CARBIDOPA-LEVO ER 25-100 TA: 25-100 | 90 days supply | Qty: 360 | Fill #1

## 2018-12-31 DIAGNOSIS — H0100A Unspecified blepharitis right eye, upper and lower eyelids: Secondary | ICD-10-CM | POA: Diagnosis not present

## 2018-12-31 DIAGNOSIS — H401132 Primary open-angle glaucoma, bilateral, moderate stage: Secondary | ICD-10-CM | POA: Diagnosis not present

## 2018-12-31 DIAGNOSIS — H04123 Dry eye syndrome of bilateral lacrimal glands: Secondary | ICD-10-CM | POA: Diagnosis not present

## 2018-12-31 DIAGNOSIS — H0100B Unspecified blepharitis left eye, upper and lower eyelids: Secondary | ICD-10-CM | POA: Diagnosis not present

## 2019-01-08 ENCOUNTER — Other Ambulatory Visit: Payer: Self-pay | Admitting: Family Medicine

## 2019-01-08 DIAGNOSIS — I10 Essential (primary) hypertension: Secondary | ICD-10-CM

## 2019-01-09 MED FILL — AMLODIPINE BESYLATE 5 MG TA: 5 | 30 days supply | Qty: 30 | Fill #0

## 2019-01-29 MED FILL — clonazePAM 0.5 MG TABS: 0.5 | 30 days supply | Qty: 30 | Fill #1

## 2019-02-02 MED FILL — CARBIDOPA-LEVO ER 50-200 TA: 50-200 | 90 days supply | Qty: 90 | Fill #1

## 2019-02-04 ENCOUNTER — Other Ambulatory Visit: Payer: Self-pay | Admitting: Family Medicine

## 2019-02-04 MED FILL — ATORVASTATIN 20 MG TABLET: 20 | 30 days supply | Qty: 30 | Fill #0

## 2019-02-10 DIAGNOSIS — L821 Other seborrheic keratosis: Secondary | ICD-10-CM | POA: Diagnosis not present

## 2019-02-10 DIAGNOSIS — D1801 Hemangioma of skin and subcutaneous tissue: Secondary | ICD-10-CM | POA: Diagnosis not present

## 2019-02-10 DIAGNOSIS — D225 Melanocytic nevi of trunk: Secondary | ICD-10-CM | POA: Diagnosis not present

## 2019-02-10 DIAGNOSIS — L570 Actinic keratosis: Secondary | ICD-10-CM | POA: Diagnosis not present

## 2019-02-10 DIAGNOSIS — L218 Other seborrheic dermatitis: Secondary | ICD-10-CM | POA: Diagnosis not present

## 2019-02-10 DIAGNOSIS — L814 Other melanin hyperpigmentation: Secondary | ICD-10-CM | POA: Diagnosis not present

## 2019-02-16 MED FILL — AMLODIPINE BESYLATE 5 MG TA: 5 | 30 days supply | Qty: 30 | Fill #1

## 2019-02-23 ENCOUNTER — Other Ambulatory Visit: Payer: Self-pay | Admitting: Neurology

## 2019-02-23 ENCOUNTER — Telehealth: Payer: Self-pay | Admitting: Family Medicine

## 2019-02-23 ENCOUNTER — Other Ambulatory Visit: Payer: Self-pay | Admitting: Family Medicine

## 2019-02-23 MED ORDER — ATORVASTATIN CALCIUM 20 MG PO TABS: 20.0000 mg | ORAL_TABLET | Freq: Every day | ORAL | 1 refills | Status: DC

## 2019-02-23 MED FILL — ATENOLOL 100 MG TABS: 100 | 90 days supply | Qty: 45 | Fill #2

## 2019-02-23 MED FILL — clonazePAM 0.5 MG TABS: 0.5 | 30 days supply | Qty: 30 | Fill #0

## 2019-02-23 MED FILL — DIVALPROEX SOD ER 250 MG TA: 250 | 90 days supply | Qty: 90 | Fill #1

## 2019-02-23 NOTE — Telephone Encounter (Signed)
Patient scheduled for 04/05/2018 medication follow up (pcp next available) patient will run out of atorvastatin (LIPITOR) 20 MG tablet ,divalproex (DEPAKOTE ER) 250 MG 24 hr tablet .  [facility] [place] [address]

## 2019-02-23 NOTE — Telephone Encounter (Signed)
Dr. Carles Collet patient

## 2019-02-23 NOTE — Telephone Encounter (Signed)
Sent in medication

## 2019-03-09 DIAGNOSIS — H43813 Vitreous degeneration, bilateral: Secondary | ICD-10-CM | POA: Diagnosis not present

## 2019-03-09 DIAGNOSIS — D3131 Benign neoplasm of right choroid: Secondary | ICD-10-CM | POA: Diagnosis not present

## 2019-03-09 DIAGNOSIS — H353131 Nonexudative age-related macular degeneration, bilateral, early dry stage: Secondary | ICD-10-CM | POA: Insufficient documentation

## 2019-03-09 DIAGNOSIS — H35363 Drusen (degenerative) of macula, bilateral: Secondary | ICD-10-CM | POA: Diagnosis not present

## 2019-03-09 DIAGNOSIS — H5211 Myopia, right eye: Secondary | ICD-10-CM | POA: Diagnosis not present

## 2019-03-09 DIAGNOSIS — H5202 Hypermetropia, left eye: Secondary | ICD-10-CM | POA: Diagnosis not present

## 2019-03-09 DIAGNOSIS — H524 Presbyopia: Secondary | ICD-10-CM | POA: Diagnosis not present

## 2019-03-09 DIAGNOSIS — H401132 Primary open-angle glaucoma, bilateral, moderate stage: Secondary | ICD-10-CM | POA: Diagnosis not present

## 2019-03-09 DIAGNOSIS — H04123 Dry eye syndrome of bilateral lacrimal glands: Secondary | ICD-10-CM | POA: Diagnosis not present

## 2019-03-09 DIAGNOSIS — Z961 Presence of intraocular lens: Secondary | ICD-10-CM | POA: Diagnosis not present

## 2019-03-09 DIAGNOSIS — H52203 Unspecified astigmatism, bilateral: Secondary | ICD-10-CM | POA: Diagnosis not present

## 2019-03-09 HISTORY — DX: Nonexudative age-related macular degeneration, bilateral, early dry stage: H35.3131

## 2019-03-10 NOTE — Progress Notes (Signed)
Virtual Visit via Video Note The purpose of this virtual visit is to provide medical care while limiting exposure to the novel coronavirus.    Consent was obtained for video visit:  Yes.   Answered questions that patient had about telehealth interaction:  Yes.   I discussed the limitations, risks, security and privacy concerns of performing an evaluation and management service by telemedicine. I also discussed with the patient that there may be a patient responsible charge related to this service. The patient expressed understanding and agreed to proceed.  Pt location: Home Physician Location: office Name of referring provider:  Mosie Lukes, MD I connected with Vincent Walker at patients initiation/request on 03/12/2019 at 11:15 AM EST by video enabled telemedicine application and verified that I am speaking with the correct person using two identifiers. Pt MRN:  [id number] Pt DOB:  1946/01/13 Video Participants:  Vincent Walker;  Wife supplements history   History of Present Illness:  Patient seen today in follow-up for PD.  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  He is on carbidopa/levodopa 25/100 CR, 2 tablets in the morning (8am), 1 in the afternoon (1pm) and 1 in the evening (6-8pm) and carbidopa/levodopa 50/200 at bedtime.  It did improve sleepiness some but still sleepy in the day.  Finds that he may feel draggy.  BP running in the 110's.    I did increase his clonazepam last visit to 0.5 mg at bedtime for restless leg.  He reports that RLS is well controlled - only had it when watching TV.  Medical records are reviewed since last visit.  He followed up with urology for his history of renal cell carcinoma in 2010 as well as stage III kidney disease.  Patient is also on Depakote ER, 250 mg daily for mood.  Primary care is now providing this.  States that he has "good days and bad days" with  mood.   Seeing ophth for dx macular degeneration.  Prior medications: Sertraline (felt more depressed); Lexapro (no help)   Current Outpatient Medications on File Prior to Visit  Medication Sig Dispense Refill   acetaminophen (TYLENOL) 500 MG tablet Take 1,000 mg by mouth every 6 (six) hours as needed (pain.).      amLODipine (NORVASC) 5 MG tablet TAKE 1 TABLET BY MOUTH DAILY 30 tablet 5   aspirin EC 81 MG tablet Take 81 mg by mouth at bedtime.     atenolol (TENORMIN) 100 MG tablet TAKE 1/2 TABLET BY MOUTH DAILY (Patient taking differently: Take 50 mg by mouth every evening. ) 45 tablet 2   atorvastatin (LIPITOR) 20 MG tablet TAKE 1 TABLET BY MOUTH DAILY 30 tablet 0   atorvastatin (LIPITOR) 20 MG tablet Take 1 tablet (20 mg total) by mouth daily. 90 tablet 1   brimonidine (ALPHAGAN) 0.2 % ophthalmic solution Place 1 drop into both eyes 2 (two) times daily.     carbidopa-levodopa (SINEMET CR) 50-200 MG tablet Take 1 tablet by mouth at bedtime. 90 tablet 1   Carbidopa-Levodopa ER (SINEMET CR) 25-100 MG tablet controlled release 2 in the AM, 1 in the afternoon, 1 in the evening (Patient taking differently: Take 1-2 tablets by mouth See admin instructions. 2 in the AM, 1 in the afternoon, 1 in the evening) 360 tablet 1   clonazePAM (KLONOPIN) 0.5 MG tablet TAKE 1 TABLET BY MOUTH AT BEDTIME  30 tablet 0   divalproex (DEPAKOTE ER) 250 MG 24 hr tablet TAKE 1 TABLET (250 MG TOTAL) BY MOUTH DAILY. (Patient taking differently: Take 250 mg by mouth every evening. ) 90 tablet 1   Multiple Vitamins-Minerals (CENTRUM SILVER PO) Take 1 tablet by mouth at bedtime.      Polyethyl Glycol-Propyl Glycol (LUBRICANT EYE DROPS) 0.4-0.3 % SOLN Place 1 drop into both eyes 3 (three) times daily as needed (dry/irritated eyes.).     polyethylene glycol-electrolytes (NULYTELY/GOLYTELY) 420 g solution Take 4,000 mLs by mouth as directed. 4000 mL 0   sodium chloride (OCEAN) 0.65 % SOLN nasal spray Place 1 spray  into both nostrils as needed for congestion. 88 mL 2   No current facility-administered medications on file prior to visit.     Observations/Objective:   Vitals:   03/12/19 1121  BP: 110/75   GEN:  The patient appears stated age and is in NAD.  Neurological examination:  Orientation: The patient is alert and oriented x3. Cranial nerves: There is good facial symmetry. There is no facial hypomimia.  The speech is fluent and clear. Soft palate rises symmetrically and there is no tongue deviation. Hearing is intact to conversational tone. Motor: Strength is at least antigravity x 4.   Shoulder shrug is equal and symmetric.  There is no pronator drift.  Movement examination: Tone: unable Abnormal movements: Right upper extremity tremor with ambulation Coordination:  There is mild decremation with RAM's, with hand opening and closing and finger taps on the right. Gait and Station: The patient walks well with good arm swing in the home.  There is reemergent tremor on the right with ambulation.    Assessment and Plan:   1.  Idiopathic Parkinson's disease, diagnosed August, 2017.                -We discussed the diagnosis as well as pathophysiology of the disease.  We discussed treatment options as well as prognostic indicators.  Patient education was provided.             -Patient will continue carbidopa/levodopa 25/100 CR, 2 tablets in the morning, 1 in the afternoon and 1 in the evening.  Changing from the IR may have helped EDS some.             -continue carbidopa/levodopa 50/200 1 hour prior to bedtime     2.  RLS/RBD/insomnia             -continue carbidopa/levodopa 50/200 at bedtime  -Continue klonopin, 0.5 mg q hs.  Risks, benefits, side effects and alternative therapies were discussed.  The opportunity to ask questions was given and they were answered to the best of my ability.  The patient expressed understanding and willingness to follow the outlined treatment  protocols.  3.  Depression and anxiety             -Can be associated with Parkinson's disease.               -Depakote ER, 250 mg.  Primary care is providing refills.   4.    Daytime hypersomnolence  -Has a lot of fatigue.  Wonder if some of this is related to his blood pressure medications.  Did ask him to talk to Dr. Randel Pigg about this.  Maybe one could be eliminated or decreased further.    Chemistry      Component Value Date/Time   NA 138 09/16/2018 1021   K 4.3 09/16/2018 1021   CL  102 09/16/2018 1021   CO2 29 09/16/2018 1021   BUN 18 09/16/2018 1021   CREATININE 1.46 09/16/2018 1021      Component Value Date/Time   CALCIUM 9.4 09/16/2018 1021   ALKPHOS 68 09/16/2018 1021   AST 18 09/16/2018 1021   ALT 6 09/16/2018 1021   BILITOT 0.7 09/16/2018 1021     Lab Results  Component Value Date   TSH 2.91 09/16/2018     Follow Up Instructions:  5 months   -I discussed the assessment and treatment plan with the patient. The patient was provided an opportunity to ask questions and all were answered. The patient agreed with the plan and demonstrated an understanding of the instructions.   The patient was advised to call back or seek an in-person evaluation if the symptoms worsen or if the condition fails to improve as anticipated.    Total Time spent in visit with the patient was:  25 min, of which more than 50% of the time was spent in counseling.   Pt understands and agrees with the plan of care outlined.     Alonza Bogus, DO

## 2019-03-12 ENCOUNTER — Other Ambulatory Visit: Payer: Self-pay

## 2019-03-12 ENCOUNTER — Telehealth (INDEPENDENT_AMBULATORY_CARE_PROVIDER_SITE_OTHER): Payer: Medicare Other | Admitting: Neurology

## 2019-03-12 VITALS — BP 110/75

## 2019-03-12 DIAGNOSIS — G20 Parkinson's disease: Secondary | ICD-10-CM | POA: Diagnosis not present

## 2019-03-12 DIAGNOSIS — G2581 Restless legs syndrome: Secondary | ICD-10-CM

## 2019-03-12 MED ORDER — ATENOLOL 25 MG PO TABS: 25.0000 mg | ORAL_TABLET | Freq: Every day | ORAL | 0 refills | Status: DC

## 2019-03-12 MED FILL — ATENOLOL 25 MG TABS: 25 | 90 days supply | Qty: 90 | Fill #0

## 2019-03-12 NOTE — Addendum Note (Signed)
Addended by: Magdalene Molly A on: 03/12/2019 02:23 PM   Modules accepted: Orders

## 2019-03-12 NOTE — Progress Notes (Signed)
Spoke with patients spouse about medication change she voiced her understanding

## 2019-03-12 NOTE — Progress Notes (Signed)
Please let him know Dr Tat reached out to me about his fatigue. I reviewed his chart and think we could try dropping his Atenolol down from 50 mg to 25 mg at bedtime and see if that helps his fatigue. If he agrees he needs a new prescription for the 25 mg tabs, 1 tab po qhs, D30 with 2 rf or #90 with 0 rf then I have appt with him in January to review response with him

## 2019-03-16 MED FILL — AMLODIPINE BESYLATE 5 MG TA: 5 | 30 days supply | Qty: 30 | Fill #2

## 2019-03-31 ENCOUNTER — Other Ambulatory Visit: Payer: Self-pay | Admitting: Neurology

## 2019-03-31 MED FILL — CARBIDOPA-LEVO ER 25-100 TA: 25-100 | 90 days supply | Qty: 360 | Fill #0

## 2019-04-06 ENCOUNTER — Ambulatory Visit (INDEPENDENT_AMBULATORY_CARE_PROVIDER_SITE_OTHER): Payer: Medicare Other | Admitting: Family Medicine

## 2019-04-06 ENCOUNTER — Other Ambulatory Visit: Payer: Self-pay

## 2019-04-06 ENCOUNTER — Encounter: Payer: Self-pay | Admitting: Family Medicine

## 2019-04-06 VITALS — BP 120/78 | HR 75 | Temp 98.3°F | Resp 18 | Wt 243.6 lb

## 2019-04-06 DIAGNOSIS — E785 Hyperlipidemia, unspecified: Secondary | ICD-10-CM

## 2019-04-06 DIAGNOSIS — N189 Chronic kidney disease, unspecified: Secondary | ICD-10-CM

## 2019-04-06 DIAGNOSIS — I10 Essential (primary) hypertension: Secondary | ICD-10-CM | POA: Diagnosis not present

## 2019-04-06 DIAGNOSIS — R739 Hyperglycemia, unspecified: Secondary | ICD-10-CM

## 2019-04-06 DIAGNOSIS — G20A1 Parkinson's disease without dyskinesia, without mention of fluctuations: Secondary | ICD-10-CM

## 2019-04-06 DIAGNOSIS — N289 Disorder of kidney and ureter, unspecified: Secondary | ICD-10-CM

## 2019-04-06 DIAGNOSIS — H3530 Unspecified macular degeneration: Secondary | ICD-10-CM

## 2019-04-06 DIAGNOSIS — G20 Parkinson's disease: Secondary | ICD-10-CM

## 2019-04-06 LAB — LIPID PANEL
Cholesterol: 123 mg/dL (ref 0–200)
HDL: 46.4 mg/dL (ref >39.00)
LDL Cholesterol: 47 mg/dL (ref 0–99)
NonHDL: 76.34
Total CHOL/HDL Ratio: 3
Triglycerides: 147 mg/dL (ref 0.0–149.0)
VLDL: 29.4 mg/dL (ref 0.0–40.0)

## 2019-04-06 LAB — CBC
HCT: 47.6 % (ref 39.0–52.0)
Hemoglobin: 15.9 g/dL (ref 13.0–17.0)
MCHC: 33.5 g/dL (ref 30.0–36.0)
MCV: 93.2 fl (ref 78.0–100.0)
Platelets: 155 10*3/uL (ref 150.0–400.0)
RBC: 5.11 Mil/uL (ref 4.22–5.81)
RDW: 13.7 % (ref 11.5–15.5)
WBC: 7.8 10*3/uL (ref 4.0–10.5)

## 2019-04-06 LAB — TSH: TSH: 2.45 u[IU]/mL (ref 0.35–4.50)

## 2019-04-06 LAB — COMPREHENSIVE METABOLIC PANEL
ALT: 12 U/L (ref 0–53)
AST: 18 U/L (ref 0–37)
Albumin: 4.6 g/dL (ref 3.5–5.2)
Alkaline Phosphatase: 85 U/L (ref 39–117)
BUN: 20 mg/dL (ref 6–23)
CO2: 26 mEq/L (ref 19–32)
Calcium: 9.6 mg/dL (ref 8.4–10.5)
Chloride: 103 mEq/L (ref 96–112)
Creatinine, Ser: 1.42 mg/dL (ref 0.40–1.50)
GFR: 48.48 mL/min — ABNORMAL LOW (ref >60.00)
Glucose, Bld: 105 mg/dL — ABNORMAL HIGH (ref 70–99)
Potassium: 4.6 mEq/L (ref 3.5–5.1)
Sodium: 139 mEq/L (ref 135–145)
Total Bilirubin: 0.7 mg/dL (ref 0.2–1.2)
Total Protein: 6.9 g/dL (ref 6.0–8.3)

## 2019-04-06 MED ORDER — CLONAZEPAM 0.5 MG PO TABS: 0.5000 mg | ORAL_TABLET | Freq: Every day | ORAL | 0 refills | Status: DC

## 2019-04-06 MED ORDER — ATENOLOL 25 MG PO TABS: 25.0000 mg | ORAL_TABLET | Freq: Every evening | ORAL | 0 refills | Status: DC

## 2019-04-06 MED FILL — clonazePAM 0.5 MG TABS: 0.5 | 30 days supply | Qty: 30 | Fill #0

## 2019-04-06 NOTE — Patient Instructions (Addendum)
Pulse oximeter want oxygen in the 90s   Multivitamin with minerals, selenium, zinc, vitamin C and vitamin D Vitamin D 2000 IU daily ECASA 81 mg daily  Melatonin 1-5 mg at bedtime  Miralax with Benefiber once to twice daily  Encouraged increased hydration and fiber in diet. Daily probiotics. If bowels not moving can use MOM 2 tbls po in 4 oz of warm prune juice by mouth every 2-3 days. If no results then repeat in 4 hours with  Dulcolax suppository pr, may repeat again in 4 more hours as needed. Seek care if symptoms worsen. Consider daily Miralax and/or Dulcolax if symptoms persist.      Hypertension, Adult High blood pressure (hypertension) is when the force of blood pumping through the arteries is too strong. The arteries are the blood vessels that carry blood from the heart throughout the body. Hypertension forces the heart to work harder to pump blood and may cause arteries to become narrow or stiff. Untreated or uncontrolled hypertension can cause a heart attack, heart failure, a stroke, kidney disease, and other problems. A blood pressure reading consists of a higher number over a lower number. Ideally, your blood pressure should be below 120/80. The first ("top") number is called the systolic pressure. It is a measure of the pressure in your arteries as your heart beats. The second ("bottom") number is called the diastolic pressure. It is a measure of the pressure in your arteries as the heart relaxes. What are the causes? The exact cause of this condition is not known. There are some conditions that result in or are related to high blood pressure. What increases the risk? Some risk factors for high blood pressure are under your control. The following factors may make you more likely to develop this condition:  Smoking.  Having type 2 diabetes mellitus, high cholesterol, or both.  Not getting enough exercise or physical activity.  Being overweight.  Having too much fat, sugar,  calories, or salt (sodium) in your diet.  Drinking too much alcohol. Some risk factors for high blood pressure may be difficult or impossible to change. Some of these factors include:  Having chronic kidney disease.  Having a family history of high blood pressure.  Age. Risk increases with age.  Race. You may be at higher risk if you are African American.  Gender. Men are at higher risk than women before age 35. After age 81, women are at higher risk than men.  Having obstructive sleep apnea.  Stress. What are the signs or symptoms? High blood pressure may not cause symptoms. Very high blood pressure (hypertensive crisis) may cause:  Headache.  Anxiety.  Shortness of breath.  Nosebleed.  Nausea and vomiting.  Vision changes.  Severe chest pain.  Seizures. How is this diagnosed? This condition is diagnosed by measuring your blood pressure while you are seated, with your arm resting on a flat surface, your legs uncrossed, and your feet flat on the floor. The cuff of the blood pressure monitor will be placed directly against the skin of your upper arm at the level of your heart. It should be measured at least twice using the same arm. Certain conditions can cause a difference in blood pressure between your right and left arms. Certain factors can cause blood pressure readings to be lower or higher than normal for a short period of time:  When your blood pressure is higher when you are in a health care provider's office than when you are at home,  this is called white coat hypertension. Most people with this condition do not need medicines.  When your blood pressure is higher at home than when you are in a health care provider's office, this is called masked hypertension. Most people with this condition may need medicines to control blood pressure. If you have a high blood pressure reading during one visit or you have normal blood pressure with other risk factors, you may be  asked to:  Return on a different day to have your blood pressure checked again.  Monitor your blood pressure at home for 1 week or longer. If you are diagnosed with hypertension, you may have other blood or imaging tests to help your health care provider understand your overall risk for other conditions. How is this treated? This condition is treated by making healthy lifestyle changes, such as eating healthy foods, exercising more, and reducing your alcohol intake. Your health care provider may prescribe medicine if lifestyle changes are not enough to get your blood pressure under control, and if:  Your systolic blood pressure is above 130.  Your diastolic blood pressure is above 80. Your personal target blood pressure may vary depending on your medical conditions, your age, and other factors. Follow these instructions at home: Eating and drinking   Eat a diet that is high in fiber and potassium, and low in sodium, added sugar, and fat. An example eating plan is called the DASH (Dietary Approaches to Stop Hypertension) diet. To eat this way: ? Eat plenty of fresh fruits and vegetables. Try to fill one half of your plate at each meal with fruits and vegetables. ? Eat whole grains, such as whole-wheat pasta, brown rice, or whole-grain bread. Fill about one fourth of your plate with whole grains. ? Eat or drink low-fat dairy products, such as skim milk or low-fat yogurt. ? Avoid fatty cuts of meat, processed or cured meats, and poultry with skin. Fill about one fourth of your plate with lean proteins, such as fish, chicken without skin, beans, eggs, or tofu. ? Avoid pre-made and processed foods. These tend to be higher in sodium, added sugar, and fat.  Reduce your daily sodium intake. Most people with hypertension should eat less than 1,500 mg of sodium a day.  Do not drink alcohol if: ? Your health care provider tells you not to drink. ? You are pregnant, may be pregnant, or are planning  to become pregnant.  If you drink alcohol: ? Limit how much you use to:  0-1 drink a day for women.  0-2 drinks a day for men. ? Be aware of how much alcohol is in your drink. In the [place] one drink equals one 12 oz bottle of beer (355 mL), one 5 oz glass of wine (148 mL), or one 1 oz glass of hard liquor (44 mL). Lifestyle   Work with your health care provider to maintain a healthy body weight or to lose weight. Ask what an ideal weight is for you.  Get at least 30 minutes of exercise most days of the week. Activities may include walking, swimming, or biking.  Include exercise to strengthen your muscles (resistance exercise), such as Pilates or lifting weights, as part of your weekly exercise routine. Try to do these types of exercises for 30 minutes at least 3 days a week.  Do not use any products that contain nicotine or tobacco, such as cigarettes, e-cigarettes, and chewing tobacco. If you need help quitting, ask your health care provider.  Monitor your blood pressure at home as told by your health care provider.  Keep all follow-up visits as told by your health care provider. This is important. Medicines  Take over-the-counter and prescription medicines only as told by your health care provider. Follow directions carefully. Blood pressure medicines must be taken as prescribed.  Do not skip doses of blood pressure medicine. Doing this puts you at risk for problems and can make the medicine less effective.  Ask your health care provider about side effects or reactions to medicines that you should watch for. Contact a health care provider if you:  Think you are having a reaction to a medicine you are taking.  Have headaches that keep coming back (recurring).  Feel dizzy.  Have swelling in your ankles.  Have trouble with your vision. Get help right away if you:  Develop a severe headache or confusion.  Have unusual weakness or numbness.  Feel faint.  Have severe  pain in your chest or abdomen.  Vomit repeatedly.  Have trouble breathing. Summary  Hypertension is when the force of blood pumping through your arteries is too strong. If this condition is not controlled, it may put you at risk for serious complications.  Your personal target blood pressure may vary depending on your medical conditions, your age, and other factors. For most people, a normal blood pressure is less than 120/80.  Hypertension is treated with lifestyle changes, medicines, or a combination of both. Lifestyle changes include losing weight, eating a healthy, low-sodium diet, exercising more, and limiting alcohol. This information is not intended to replace advice given to you by your health care provider. Make sure you discuss any questions you have with your health care provider. Document Revised: 11/27/2017 Document Reviewed: 11/27/2017 Elsevier Patient Education  2020 [employer].

## 2019-04-07 ENCOUNTER — Other Ambulatory Visit (INDEPENDENT_AMBULATORY_CARE_PROVIDER_SITE_OTHER): Payer: Medicare Other

## 2019-04-07 DIAGNOSIS — R739 Hyperglycemia, unspecified: Secondary | ICD-10-CM | POA: Diagnosis not present

## 2019-04-07 LAB — HEMOGLOBIN A1C: Hgb A1c MFr Bld: 6 % (ref 4.6–6.5)

## 2019-04-08 DIAGNOSIS — H3530 Unspecified macular degeneration: Secondary | ICD-10-CM | POA: Insufficient documentation

## 2019-04-08 NOTE — Assessment & Plan Note (Signed)
Tolerating statin, encouraged heart healthy diet, avoid trans fats, minimize simple carbs and saturated fats. Increase exercise as tolerated 

## 2019-04-08 NOTE — Progress Notes (Signed)
Subjective:    Patient ID: Vincent Walker, male    DOB: 1945-12-30, 74 y.o.   MRN: [id number]  No chief complaint on file.   HPI Patient is in today for follow up on chronic medical concerns including hypertension and Parkinson's disease. He is accompanied by his wife and they report he has felt better since his Atenolol was decreased. No flare in palpitations. He had to have his left nares cauterized since his last visit but has had no further epistaxis since that time. Denies CP/palp/SOB/HA/congestion/fevers/GI or GU c/o. Taking meds as prescribed  Past Medical History:  Diagnosis Date  . Anxiety state 12/23/2015  . Arthritis   . Benign essential HTN 02/08/2015  . Cancer [facility])    Kidney Cancer  . Chronic kidney disease   . Chronic renal insufficiency 02/14/2015  . Constipation 12/23/2015  . Depression   . Dyslipidemia 02/08/2015  . Glaucoma   . H/O renal cell carcinoma 02/08/2015  . History of blood transfusion 2010   After Kidney surgery  . History of chicken pox   . Hyperglycemia 12/23/2015  . Hyperlipidemia   . Hypertension   . Ingrown left big toenail 02/14/2015  . Left foot pain 02/14/2015  . Mitral valve prolapse   . MVP (mitral valve prolapse) 05/14/2016  . Neuromuscular disorder ([place])   . Parkinson disease ([place]) 12/23/2015  . Preventative health care 06/21/2016  . Thrombocytopenia ([place]) 02/08/2015  . Tremor of right hand 02/14/2015    Past Surgical History:  Procedure Laterality Date  . APPENDECTOMY  1995  . CATARACT EXTRACTION, BILATERAL    . COLONOSCOPY    . COLONOSCOPY WITH PROPOFOL N/A 12/17/2018   Procedure: COLONOSCOPY WITH PROPOFOL;  Surgeon: Rush Landmark Telford Nab., MD;  Location: [place];  Service: Gastroenterology;  Laterality: N/A;  . ENDOSCOPIC MUCOSAL RESECTION N/A 12/17/2018   Procedure: ENDOSCOPIC MUCOSAL RESECTION;  Surgeon: Rush Landmark Telford Nab., MD;  Location: [place];  Service: Gastroenterology;  Laterality: N/A;  . HEMOSTASIS CLIP  PLACEMENT  12/17/2018   Procedure: HEMOSTASIS CLIP PLACEMENT;  Surgeon: Irving Copas., MD;  Location: [place];  Service: Gastroenterology;;  . left knee scope  2003  . POLYPECTOMY  12/17/2018   Procedure: POLYPECTOMY;  Surgeon: Mansouraty, Telford Nab., MD;  Location: [place];  Service: Gastroenterology;;  . right knee scope  1999  . SUBMUCOSAL LIFTING INJECTION  12/17/2018   Procedure: SUBMUCOSAL LIFTING INJECTION;  Surgeon: Rush Landmark Telford Nab., MD;  Location: [place];  Service: Gastroenterology;;  . TOE SURGERY Left    metal 2nd toe- and top of foot- straighten bone  . TONSILLECTOMY    . TOTAL KNEE ARTHROPLASTY Left 07/06/2014  . TOTAL NEPHRECTOMY Right     Family History  Problem Relation Age of Onset  . Alcohol abuse Father   . Hyperlipidemia Father   . Hypertension Father   . Diabetes Father   . Heart disease Father   . Cancer Maternal Aunt        lung cancer  . Cancer Paternal Uncle        bone cancer  . Heart attack Mother   . Stroke Mother        swelling in brain stem  . Healthy Daughter   . Healthy Son   . Healthy Daughter   . Colon cancer Neg Hx   . Esophageal cancer Neg Hx   . Stomach cancer Neg Hx   . Inflammatory bowel disease Neg Hx   . Liver disease Neg Hx   .  Pancreatic cancer Neg Hx   . Rectal cancer Neg Hx     Social History   Socioeconomic History  . Marital status: Married    Spouse name: Not on file  . Number of children: 3  . Years of education: 16  . Highest education level: Bachelor's degree (e.g., BA, AB, BS)  Occupational History  . Occupation: retired Freight forwarder in [employer]  Tobacco Use  . Smoking status: Former Smoker    Types: Pipe    Quit date: 11/18/1995    Years since quitting: 23.4  . Smokeless tobacco: Never Used  . Tobacco comment: stopped 20 years ago.  1996  Substance and Sexual Activity  . Alcohol use: Yes    Alcohol/week: 0.0 standard drinks    Comment: rare/ social  . Drug use: No  .  Sexual activity: Yes    Comment: lives with wife, retired from Naval architect in plant, no major dietary restrictions   Other Topics Concern  . Not on file  Social History Narrative  . Not on file   Social Determinants of Health   Financial Resource Strain:   . Difficulty of Paying Living Expenses: Not on file  Food Insecurity:   . Worried About Charity fundraiser in the Last Year: Not on file  . Ran Out of Food in the Last Year: Not on file  Transportation Needs:   . Lack of Transportation (Medical): Not on file  . Lack of Transportation (Non-Medical): Not on file  Physical Activity: Inactive  . Days of Exercise per Week: 0 days  . Minutes of Exercise per Session: 0 min  Stress:   . Feeling of Stress : Not on file  Social Connections:   . Frequency of Communication with Friends and Family: Not on file  . Frequency of Social Gatherings with Friends and Family: Not on file  . Attends Religious Services: Not on file  . Active Member of Clubs or Organizations: Not on file  . Attends Archivist Meetings: Not on file  . Marital Status: Not on file  Intimate Partner Violence:   . Fear of Current or Ex-Partner: Not on file  . Emotionally Abused: Not on file  . Physically Abused: Not on file  . Sexually Abused: Not on file    Outpatient Medications Prior to Visit  Medication Sig Dispense Refill  . acetaminophen (TYLENOL) 500 MG tablet Take 1,000 mg by mouth every 6 (six) hours as needed (pain.).     Marland Kitchen amLODipine (NORVASC) 5 MG tablet TAKE 1 TABLET BY MOUTH DAILY 30 tablet 5  . aspirin EC 81 MG tablet Take 81 mg by mouth at bedtime.    Marland Kitchen atorvastatin (LIPITOR) 20 MG tablet Take 1 tablet (20 mg total) by mouth daily. 90 tablet 1  . brimonidine (ALPHAGAN) 0.2 % ophthalmic solution Place 1 drop into both eyes 2 (two) times daily.    . carbidopa-levodopa (SINEMET CR) 50-200 MG tablet Take 1 tablet by mouth at bedtime. 90 tablet 1  . Carbidopa-Levodopa ER (SINEMET  CR) 25-100 MG tablet controlled release TAKE 2 TABLETS BY MOUTH EVERY MORNING THEN TAKE 1 TABLET BY MOUTH IN THE AFTERNOON AND 1 TABLET EVERY EVENING 360 tablet 1  . divalproex (DEPAKOTE ER) 250 MG 24 hr tablet TAKE 1 TABLET (250 MG TOTAL) BY MOUTH DAILY. (Patient taking differently: Take 250 mg by mouth every evening. ) 90 tablet 1  . Multiple Vitamins-Minerals (CENTRUM SILVER PO) Take 1 tablet by mouth at bedtime.     Marland Kitchen  Polyethyl Glycol-Propyl Glycol (LUBRICANT EYE DROPS) 0.4-0.3 % SOLN Place 1 drop into both eyes 3 (three) times daily as needed (dry/irritated eyes.).    Marland Kitchen polyethylene glycol-electrolytes (NULYTELY/GOLYTELY) 420 g solution Take 4,000 mLs by mouth as directed. 4000 mL 0  . sodium chloride (OCEAN) 0.65 % SOLN nasal spray Place 1 spray into both nostrils as needed for congestion. 88 mL 2  . atenolol (TENORMIN) 25 MG tablet Take 1 tablet (25 mg total) by mouth daily. 90 tablet 0  . atorvastatin (LIPITOR) 20 MG tablet TAKE 1 TABLET BY MOUTH DAILY 30 tablet 0  . clonazePAM (KLONOPIN) 0.5 MG tablet TAKE 1 TABLET BY MOUTH AT BEDTIME 30 tablet 0   No facility-administered medications prior to visit.    Allergies  Allergen Reactions  . Nsaids   . Sulfa Antibiotics Other (See Comments)    Had a reaction as a child  . Tolmetin Other (See Comments)    Review of Systems  Constitutional: Negative for fever and malaise/fatigue.  HENT: Negative for congestion.   Eyes: Negative for blurred vision.  Respiratory: Negative for shortness of breath.   Cardiovascular: Negative for chest pain, palpitations and leg swelling.  Gastrointestinal: Negative for abdominal pain, blood in stool and nausea.  Genitourinary: Negative for dysuria and frequency.  Musculoskeletal: Negative for falls.  Skin: Negative for rash.  Neurological: Positive for tremors. Negative for dizziness, loss of consciousness and headaches.  Endo/Heme/Allergies: Negative for environmental allergies.    Psychiatric/Behavioral: Negative for depression. The patient is not nervous/anxious.        Objective:    Physical Exam Vitals and nursing note reviewed.  Constitutional:      General: He is not in acute distress.    Appearance: He is well-developed.  HENT:     Head: Normocephalic and atraumatic.     Nose: Nose normal.  Eyes:     General:        Right eye: No discharge.        Left eye: No discharge.  Cardiovascular:     Rate and Rhythm: Normal rate and regular rhythm.     Heart sounds: No murmur.  Pulmonary:     Effort: Pulmonary effort is normal.     Breath sounds: Normal breath sounds.  Abdominal:     General: Bowel sounds are normal.     Palpations: Abdomen is soft.     Tenderness: There is no abdominal tenderness.  Musculoskeletal:     Cervical back: Normal range of motion and neck supple.  Skin:    General: Skin is warm and dry.  Neurological:     Mental Status: He is alert and oriented to person, place, and time.     BP 120/78 (BP Location: Left Arm, Patient Position: Sitting, Cuff Size: Normal)   Pulse 75   Temp 98.3 F (36.8 C) (Temporal)   Resp 18   Wt 243 lb 9.6 oz (110.5 kg)   SpO2 97%   BMI 30.45 kg/m  Wt Readings from Last 3 Encounters:  04/06/19 243 lb 9.6 oz (110.5 kg)  12/16/18 235 lb (106.6 kg)  10/09/18 235 lb (106.6 kg)    Diabetic Foot Exam - Simple   No data filed     Lab Results  Component Value Date   WBC 7.8 04/06/2019   HGB 15.9 04/06/2019   HCT 47.6 04/06/2019   PLT 155.0 04/06/2019   GLUCOSE 105 (H) 04/06/2019   CHOL 123 04/06/2019   TRIG 147.0 04/06/2019   HDL  46.40 04/06/2019   LDLCALC 47 04/06/2019   ALT 12 04/06/2019   AST 18 04/06/2019   NA 139 04/06/2019   K 4.6 04/06/2019   CL 103 04/06/2019   CREATININE 1.42 04/06/2019   BUN 20 04/06/2019   CO2 26 04/06/2019   TSH 2.45 04/06/2019   INR 1.1 (H) 12/12/2018   HGBA1C 6.0 04/07/2019    Lab Results  Component Value Date   TSH 2.45 04/06/2019   Lab  Results  Component Value Date   WBC 7.8 04/06/2019   HGB 15.9 04/06/2019   HCT 47.6 04/06/2019   MCV 93.2 04/06/2019   PLT 155.0 04/06/2019   Lab Results  Component Value Date   NA 139 04/06/2019   K 4.6 04/06/2019   CO2 26 04/06/2019   GLUCOSE 105 (H) 04/06/2019   BUN 20 04/06/2019   CREATININE 1.42 04/06/2019   BILITOT 0.7 04/06/2019   ALKPHOS 85 04/06/2019   AST 18 04/06/2019   ALT 12 04/06/2019   PROT 6.9 04/06/2019   ALBUMIN 4.6 04/06/2019   CALCIUM 9.6 04/06/2019   GFR 48.48 (L) 04/06/2019   Lab Results  Component Value Date   CHOL 123 04/06/2019   Lab Results  Component Value Date   HDL 46.40 04/06/2019   Lab Results  Component Value Date   LDLCALC 47 04/06/2019   Lab Results  Component Value Date   TRIG 147.0 04/06/2019   Lab Results  Component Value Date   CHOLHDL 3 04/06/2019   Lab Results  Component Value Date   HGBA1C 6.0 04/07/2019       Assessment & Plan:   Problem List Items Addressed This Visit    Benign essential HTN - Primary    Well controlled, no changes to meds. Encouraged heart healthy diet such as the DASH diet and exercise as tolerated. Was having symptomatic hypotension so his Atenolol was dropped to 25 mg daily and his numbers and symptoms are improved. No further changes today. Report any further concerning episodes.       Relevant Medications   atenolol (TENORMIN) 25 MG tablet   Other Relevant Orders   CBC (Completed)   CMP (Completed)   TSH (Completed)   Dyslipidemia    Tolerating statin, encouraged heart healthy diet, avoid trans fats, minimize simple carbs and saturated fats. Increase exercise as tolerated      Relevant Orders   Lipid panel (Completed)   Chronic renal insufficiency   Parkinson disease ([place])    Continues to follow with neurology and he is doing well      Relevant Medications   clonazePAM (KLONOPIN) 0.5 MG tablet   Hyperglycemia    hgba1c acceptable, minimize simple carbs. Increase exercise as  tolerated.       Macular degeneration    Is following with opthamology and they have started supplements.          I have changed Margart Sickles. Bruyere's atenolol and clonazePAM. I am also having him maintain his Multiple Vitamins-Minerals (CENTRUM SILVER PO), acetaminophen, sodium chloride, polyethylene glycol-electrolytes, carbidopa-levodopa, divalproex, aspirin EC, brimonidine, Lubricant Eye Drops, amLODipine, atorvastatin, and Carbidopa-Levodopa ER.  Meds ordered this encounter  Medications  . atenolol (TENORMIN) 25 MG tablet    Sig: Take 1 tablet (25 mg total) by mouth every evening.    Dispense:  90 tablet    Refill:  0    D/c previous script  . clonazePAM (KLONOPIN) 0.5 MG tablet    Sig: Take 1 tablet (0.5 mg total) by  mouth at bedtime.    Dispense:  30 tablet    Refill:  0     [name], MD

## 2019-04-08 NOTE — Assessment & Plan Note (Signed)
Well controlled, no changes to meds. Encouraged heart healthy diet such as the DASH diet and exercise as tolerated. Was having symptomatic hypotension so his Atenolol was dropped to 25 mg daily and his numbers and symptoms are improved. No further changes today. Report any further concerning episodes.

## 2019-04-08 NOTE — Assessment & Plan Note (Signed)
Continues to follow with neurology and he is doing well

## 2019-04-08 NOTE — Assessment & Plan Note (Signed)
Is following with opthamology and they have started supplements.

## 2019-04-08 NOTE — Assessment & Plan Note (Signed)
hgba1c acceptable, minimize simple carbs. Increase exercise as tolerated.  

## 2019-04-13 MED FILL — AMLODIPINE BESYLATE 5 MG TA: 5 | 30 days supply | Qty: 30 | Fill #3

## 2019-05-01 ENCOUNTER — Other Ambulatory Visit: Payer: Self-pay | Admitting: Family Medicine

## 2019-05-01 ENCOUNTER — Other Ambulatory Visit: Payer: Self-pay | Admitting: Neurology

## 2019-05-01 ENCOUNTER — Ambulatory Visit: Payer: Medicare Other

## 2019-05-01 MED FILL — CARBIDOPA-LEVO ER 50-200 TA: 50-200 | 90 days supply | Qty: 90 | Fill #0

## 2019-05-01 NOTE — Telephone Encounter (Signed)
Requesting:Klonopin Contract:n/a UDS:n/a Last OV:04/06/19 Next OV: n/a Last Refill:  Database:   Please advise

## 2019-05-05 ENCOUNTER — Other Ambulatory Visit: Payer: Self-pay | Admitting: Family Medicine

## 2019-05-05 NOTE — Telephone Encounter (Signed)
Requesting:klonopin Contract:no UDS:n/a Last OV:04/06/19 Next OV:n/a Last Refill:04/06/19  #[id number] Database:   Please advise

## 2019-05-06 MED FILL — AMLODIPINE BESYLATE 5 MG TA: 5 | 30 days supply | Qty: 30 | Fill #4

## 2019-05-06 MED FILL — clonazePAM 0.5 MG TABS: 0.5 | 30 days supply | Qty: 30 | Fill #0

## 2019-05-09 ENCOUNTER — Ambulatory Visit: Payer: Medicare Other | Attending: Internal Medicine

## 2019-05-09 DIAGNOSIS — Z23 Encounter for immunization: Secondary | ICD-10-CM

## 2019-05-09 NOTE — Progress Notes (Signed)
   Covid-19 Vaccination Clinic  Name:  Vincent Walker    MRN: [id number] DOB: May 03, 1945  05/09/2019  [name] was observed post Covid-19 immunization for 15 minutes without incidence. He was provided with Vaccine Information Sheet and instruction to access the V-Safe system.   [name] was instructed to call 911 with any severe reactions post vaccine: Marland Kitchen Difficulty breathing  . Swelling of your face and throat  . A fast heartbeat  . A bad rash all over your body  . Dizziness and weakness    Immunizations Administered    Name Date Dose VIS Date Route   Pfizer COVID-19 Vaccine 05/09/2019  2:06 PM 0.3 mL 03/13/2019 Intramuscular   Manufacturer: [place]   Lot: [id number]   [place]: [id number]

## 2019-05-18 DIAGNOSIS — H04123 Dry eye syndrome of bilateral lacrimal glands: Secondary | ICD-10-CM | POA: Diagnosis not present

## 2019-05-18 DIAGNOSIS — H401132 Primary open-angle glaucoma, bilateral, moderate stage: Secondary | ICD-10-CM | POA: Diagnosis not present

## 2019-06-01 ENCOUNTER — Other Ambulatory Visit: Payer: Self-pay | Admitting: Family Medicine

## 2019-06-01 ENCOUNTER — Other Ambulatory Visit: Payer: Self-pay | Admitting: Neurology

## 2019-06-01 MED FILL — clonazePAM 0.5 MG TABS: 0.5 | 30 days supply | Qty: 30 | Fill #0

## 2019-06-01 MED FILL — DIVALPROEX SOD ER 250 MG TA: 250 | 90 days supply | Qty: 90 | Fill #0

## 2019-06-01 NOTE — Telephone Encounter (Signed)
Rx(s) sent to pharmacy electronically.  

## 2019-06-01 NOTE — Telephone Encounter (Signed)
Requesting:klonopin Contract:no UDS:n/a Last OV:05/18/19 Next OV:n/a Last Refill:05/05/19  #[id number] Database:   Please advise

## 2019-06-03 ENCOUNTER — Ambulatory Visit: Payer: Medicare Other | Attending: Internal Medicine

## 2019-06-03 DIAGNOSIS — Z23 Encounter for immunization: Secondary | ICD-10-CM

## 2019-06-03 NOTE — Progress Notes (Signed)
   Covid-19 Vaccination Clinic  Name:  Vincent Walker    MRN: [id number] DOB: June 01, 1945  06/03/2019  Vincent Walker was observed post Covid-19 immunization for 15 minutes without incident. He was provided with Vaccine Information Sheet and instruction to access the V-Safe system.   Vincent Walker was instructed to call 911 with any severe reactions post vaccine: Marland Kitchen Difficulty breathing  . Swelling of face and throat  . A fast heartbeat  . A bad rash all over body  . Dizziness and weakness   Immunizations Administered    Name Date Dose VIS Date Route   Pfizer COVID-19 Vaccine 06/03/2019  9:25 AM 0.3 mL 03/13/2019 Intramuscular   Manufacturer: [place]   Lot: [id number]   [place]: [id number]

## 2019-06-04 MED FILL — ATORVASTATIN 20 MG TABLET: 20 | 90 days supply | Qty: 90 | Fill #1

## 2019-06-04 MED FILL — ATENOLOL 25 MG TABS: 25 | 90 days supply | Qty: 90 | Fill #0

## 2019-06-10 NOTE — Progress Notes (Signed)
Nurse connected with patient 06/11/19 at  3:00 PM EST by a telephone enabled telemedicine application and verified that I am speaking with the correct person using two identifiers. Patient stated full name and DOB. Patient gave permission to continue with virtual visit. Patient's location was at home and Nurse's location was at [place] office.   Subjective:   Vincent Walker is a 74 y.o. male who presents for Medicare Annual/Subsequent preventive examination.  Pt enjoys camping w/ wife and dog at [facility].  Review of Systems: No ROS.  Medicare Wellness Virtual Visit.  Visual/audio telehealth visit, UTA vital signs.   See social history for additional risk factors.  Home Safety/Smoke Alarms: Feels safe in home. Smoke alarms in place.  Lives w/ wife and 2 large dogs in 1 story home. Uses cane when he goes out.  Male:   CCS- 12/17/18.   Recall 6 months.  PSA- No results found for: PSA      Objective:    Vitals: Unable to assess. This visit is enabled though telemedicine due to Covid 19.   Advanced Directives 06/11/2019 12/17/2018 10/09/2018 06/09/2018 06/07/2017 09/08/2015  Does Patient Have a Medical Advance Directive? Yes Yes Yes Yes Yes Yes  Type of Paramedic of [place];Living will [place];Living will Living will;Healthcare Power of [place];Living will [place];Living will [place];Living will  Does patient want to make changes to medical advance directive? No - Patient declined - - No - Patient declined No - Patient declined -  Copy of [place] in Chart? No - copy requested No - copy requested - No - copy requested No - copy requested -    Tobacco Social History   Tobacco Use  Smoking Status Former Smoker  . Types: Pipe  . Quit date: 11/18/1995  . Years since quitting: 23.5  Smokeless Tobacco Never Used  Tobacco Comment   stopped 20 years ago.   1996     Counseling given: Not Answered Comment: stopped 20 years ago.  1996   Clinical Intake: Pain : No/denies pain     Past Medical History:  Diagnosis Date  . Anxiety state 12/23/2015  . Arthritis   . Benign essential HTN 02/08/2015  . Cancer [facility])    Kidney Cancer  . Chronic kidney disease   . Chronic renal insufficiency 02/14/2015  . Constipation 12/23/2015  . Depression   . Dyslipidemia 02/08/2015  . Glaucoma   . H/O renal cell carcinoma 02/08/2015  . History of blood transfusion 2010   After Kidney surgery  . History of chicken pox   . Hyperglycemia 12/23/2015  . Hyperlipidemia   . Hypertension   . Ingrown left big toenail 02/14/2015  . Left foot pain 02/14/2015  . Mitral valve prolapse   . MVP (mitral valve prolapse) 05/14/2016  . Neuromuscular disorder ([place])   . Parkinson disease ([place]) 12/23/2015  . Preventative health care 06/21/2016  . Thrombocytopenia ([place]) 02/08/2015  . Tremor of right hand 02/14/2015   Past Surgical History:  Procedure Laterality Date  . APPENDECTOMY  1995  . CATARACT EXTRACTION, BILATERAL    . COLONOSCOPY    . COLONOSCOPY WITH PROPOFOL N/A 12/17/2018   Procedure: COLONOSCOPY WITH PROPOFOL;  Surgeon: Rush Landmark Telford Nab., MD;  Location: [place];  Service: Gastroenterology;  Laterality: N/A;  . ENDOSCOPIC MUCOSAL RESECTION N/A 12/17/2018   Procedure: ENDOSCOPIC MUCOSAL RESECTION;  Surgeon: Rush Landmark, Telford Nab., MD;  Location: [place];  Service: Gastroenterology;  Laterality: N/A;  . HEMOSTASIS CLIP PLACEMENT  12/17/2018   Procedure: HEMOSTASIS CLIP PLACEMENT;  Surgeon: Irving Copas., MD;  Location: [place];  Service: Gastroenterology;;  . left knee scope  2003  . POLYPECTOMY  12/17/2018   Procedure: POLYPECTOMY;  Surgeon: Mansouraty, Telford Nab., MD;  Location: [place];  Service: Gastroenterology;;  . right knee scope  1999  . SUBMUCOSAL LIFTING INJECTION  12/17/2018   Procedure: SUBMUCOSAL LIFTING INJECTION;   Surgeon: Rush Landmark Telford Nab., MD;  Location: [place];  Service: Gastroenterology;;  . TOE SURGERY Left    metal 2nd toe- and top of foot- straighten bone  . TONSILLECTOMY    . TOTAL KNEE ARTHROPLASTY Left 07/06/2014  . TOTAL NEPHRECTOMY Right    Family History  Problem Relation Age of Onset  . Alcohol abuse Father   . Hyperlipidemia Father   . Hypertension Father   . Diabetes Father   . Heart disease Father   . Cancer Maternal Aunt        lung cancer  . Cancer Paternal Uncle        bone cancer  . Heart attack Mother   . Stroke Mother        swelling in brain stem  . Healthy Daughter   . Healthy Son   . Healthy Daughter   . Colon cancer Neg Hx   . Esophageal cancer Neg Hx   . Stomach cancer Neg Hx   . Inflammatory bowel disease Neg Hx   . Liver disease Neg Hx   . Pancreatic cancer Neg Hx   . Rectal cancer Neg Hx    Social History   Socioeconomic History  . Marital status: Married    Spouse name: Not on file  . Number of children: 3  . Years of education: 16  . Highest education level: Bachelor's degree (e.g., BA, AB, BS)  Occupational History  . Occupation: retired Freight forwarder in [employer]  Tobacco Use  . Smoking status: Former Smoker    Types: Pipe    Quit date: 11/18/1995    Years since quitting: 23.5  . Smokeless tobacco: Never Used  . Tobacco comment: stopped 20 years ago.  1996  Substance and Sexual Activity  . Alcohol use: Yes    Alcohol/week: 0.0 standard drinks    Comment: rare/ social  . Drug use: No  . Sexual activity: Yes    Comment: lives with wife, retired from Naval architect in plant, no major dietary restrictions   Other Topics Concern  . Not on file  Social History Narrative  . Not on file   Social Determinants of Health   Financial Resource Strain: Low Risk   . Difficulty of Paying Living Expenses: Not hard at all  Food Insecurity: No Food Insecurity  . Worried About Charity fundraiser in the Last Year: Never  true  . Ran Out of Food in the Last Year: Never true  Transportation Needs: No Transportation Needs  . Lack of Transportation (Medical): No  . Lack of Transportation (Non-Medical): No  Physical Activity: Inactive  . Days of Exercise per Week: 0 days  . Minutes of Exercise per Session: 0 min  Stress:   . Feeling of Stress :   Social Connections:   . Frequency of Communication with Friends and Family:   . Frequency of Social Gatherings with Friends and Family:   . Attends Religious Services:   . Active Member of Clubs or Organizations:   .  Attends Archivist Meetings:   Marland Kitchen Marital Status:     Outpatient Encounter Medications as of 06/11/2019  Medication Sig  . acetaminophen (TYLENOL) 500 MG tablet Take 1,000 mg by mouth every 6 (six) hours as needed (pain.).   Marland Kitchen amLODipine (NORVASC) 5 MG tablet TAKE 1 TABLET BY MOUTH DAILY  . aspirin EC 81 MG tablet Take 81 mg by mouth at bedtime.  Marland Kitchen atenolol (TENORMIN) 25 MG tablet Take 1 tablet (25 mg total) by mouth every evening.  Marland Kitchen atorvastatin (LIPITOR) 20 MG tablet Take 1 tablet (20 mg total) by mouth daily.  . brimonidine (ALPHAGAN) 0.2 % ophthalmic solution Place 1 drop into both eyes 2 (two) times daily.  . carbidopa-levodopa (SINEMET CR) 50-200 MG tablet TAKE 1 TABLET BY MOUTH AT BEDTIME.  . Carbidopa-Levodopa ER (SINEMET CR) 25-100 MG tablet controlled release TAKE 2 TABLETS BY MOUTH EVERY MORNING THEN TAKE 1 TABLET BY MOUTH IN THE AFTERNOON AND 1 TABLET EVERY EVENING  . clonazePAM (KLONOPIN) 0.5 MG tablet TAKE 1 TABLET (0.5 MG TOTAL) BY MOUTH AT BEDTIME.  . divalproex (DEPAKOTE ER) 250 MG 24 hr tablet TAKE 1 TABLET (250 MG TOTAL) BY MOUTH DAILY.  . Multiple Vitamins-Minerals (CENTRUM SILVER PO) Take 1 tablet by mouth at bedtime.   Vladimir Faster Glycol-Propyl Glycol (LUBRICANT EYE DROPS) 0.4-0.3 % SOLN Place 1 drop into both eyes 3 (three) times daily as needed (dry/irritated eyes.).  Marland Kitchen sodium chloride (OCEAN) 0.65 % SOLN nasal spray  Place 1 spray into both nostrils as needed for congestion.  . polyethylene glycol-electrolytes (NULYTELY/GOLYTELY) 420 g solution Take 4,000 mLs by mouth as directed. (Patient not taking: Reported on 06/11/2019)   No facility-administered encounter medications on file as of 06/11/2019.    Activities of Daily Living In your present state of health, do you have any difficulty performing the following activities: 06/11/2019  Hearing? N  Vision? N  Difficulty concentrating or making decisions? N  Walking or climbing stairs? N  Dressing or bathing? N  Doing errands, shopping? N  Preparing Food and eating ? N  Using the Toilet? N  In the past six months, have you accidently leaked urine? N  Do you have problems with loss of bowel control? N  Managing your Medications? N  Managing your Finances? N  Housekeeping or managing your Housekeeping? N  Some recent data might be hidden    Patient Care Team: Mosie Lukes, MD as PCP - General (Family Medicine) Tat, Eustace Quail, DO as Consulting Physician (Neurology)   Assessment:   This is a routine wellness examination for [employer]. Physical assessment deferred to PCP.  Exercise Activities and Dietary recommendations Current Exercise Habits: Home exercise routine, Time (Minutes): 30, Frequency (Times/Week): 4, Weekly Exercise (Minutes/Week): 120, Intensity: Mild, Exercise limited by: None identified Diet (meal preparation, eat out, water intake, caffeinated beverages, dairy products, fruits and vegetables): in general, a "healthy" diet  , well balanced   Goals    . Patient Stated     Remain physically active        Fall Risk Fall Risk  06/11/2019 10/09/2018 06/09/2018 06/06/2018 02/06/2018  Falls in the past year? 0 0 1 1 0  Number falls in past yr: 0 0 0 0 0  Injury with Fall? 0 0 0 0 0  Risk Factor Category  - - - - -  Risk for fall due to : - - Impaired balance/gait - -  Follow up Education provided;Falls prevention discussed - - Falls  evaluation completed Falls evaluation completed   Depression Screen PHQ 2/9 Scores 06/11/2019 06/09/2018 06/07/2017 12/23/2015  PHQ - 2 Score 1 0 0 0    Cognitive Function Ad8 score reviewed for issues:  Issues making decisions:no  Less interest in hobbies / activities:no  Repeats questions, stories (family complaining):no  Trouble using ordinary gadgets (microwave, computer, phone):no  Forgets the month or year: no  Mismanaging finances: no  Remembering appts:no  Daily problems with thinking and/or memory:no Ad8 score is=0    MMSE - Mini Mental State Exam 06/09/2018 06/07/2017  Orientation to time 5 5  Orientation to Place 5 5  Registration 3 3  Attention/ Calculation 5 5  Recall 2 2  Language- name 2 objects 2 2  Language- repeat 1 1  Language- follow 3 step command 3 3  Language- read & follow direction 1 1  Write a sentence 1 1  Copy design 1 1  Total score 29 29        Immunization History  Administered Date(s) Administered  . Influenza, High Dose Seasonal PF 12/23/2015, 12/10/2016  . Influenza-Unspecified 02/01/2015, 12/31/2017  . PFIZER SARS-COV-2 Vaccination 05/09/2019, 06/03/2019    Screening Tests Health Maintenance  Topic Date Due  . Hepatitis C Screening  Never done  . TETANUS/TDAP  Never done  . INFLUENZA VACCINE  11/01/2018  . PNA vac Low Risk Adult  Completed       Plan:   See you next year!  Continue to eat heart healthy diet (full of fruits, vegetables, whole grains, lean protein, water--limit salt, fat, and sugar intake) and increase physical activity as tolerated.  Continue doing brain stimulating activities (puzzles, reading, adult coloring books, staying active) to keep memory sharp.   Call to schedule your next colonoscopy.  I have personally reviewed and noted the following in the patient's chart:   . Medical and social history . Use of alcohol, tobacco or illicit drugs  . Current medications and supplements . Functional ability  and status . Nutritional status . Physical activity . Advanced directives . List of other physicians . Hospitalizations, surgeries, and ER visits in previous 12 months . Vitals . Screenings to include cognitive, depression, and falls . Referrals and appointments  In addition, I have reviewed and discussed with patient certain preventive protocols, quality metrics, and best practice recommendations. A written personalized care plan for preventive services as well as general preventive health recommendations were provided to patient.     Shela Nevin, [place]  06/11/2019

## 2019-06-11 ENCOUNTER — Encounter: Payer: Self-pay | Admitting: *Deleted

## 2019-06-11 ENCOUNTER — Other Ambulatory Visit: Payer: Self-pay

## 2019-06-11 ENCOUNTER — Ambulatory Visit (INDEPENDENT_AMBULATORY_CARE_PROVIDER_SITE_OTHER): Payer: Medicare Other | Admitting: *Deleted

## 2019-06-11 DIAGNOSIS — Z0000 Encounter for general adult medical examination without abnormal findings: Secondary | ICD-10-CM | POA: Diagnosis not present

## 2019-06-11 NOTE — Patient Instructions (Signed)
See you next year!  Continue to eat heart healthy diet (full of fruits, vegetables, whole grains, lean protein, water--limit salt, fat, and sugar intake) and increase physical activity as tolerated.  Continue doing brain stimulating activities (puzzles, reading, adult coloring books, staying active) to keep memory sharp.   Call to schedule your next colonoscopy.   Vincent Walker , Thank you for taking time to come for your Medicare Wellness Visit. I appreciate your ongoing commitment to your health goals. Please review the following plan we discussed and let me know if I can assist you in the future.   These are the goals we discussed: Goals    . Patient Stated     Remain physically active        This is a list of the screening recommended for you and due dates:  Health Maintenance  Topic Date Due  .  Hepatitis C: One time screening is recommended by Center for Disease Control  (CDC) for  adults born from [birth year] through 1967.   Never done  . Tetanus Vaccine  Never done  . Flu Shot  11/01/2018  . Pneumonia vaccines  Completed    Preventive Care 76 Years and Older, Male Preventive care refers to lifestyle choices and visits with your health care provider that can promote health and wellness. This includes:  A yearly physical exam. This is also called an annual well check.  Regular dental and eye exams.  Immunizations.  Screening for certain conditions.  Healthy lifestyle choices, such as diet and exercise. What can I expect for my preventive care visit? Physical exam Your health care provider will check:  Height and weight. These may be used to calculate body mass index (BMI), which is a measurement that tells if you are at a healthy weight.  Heart rate and blood pressure.  Your skin for abnormal spots. Counseling Your health care provider may ask you questions about:  Alcohol, tobacco, and drug use.  Emotional well-being.  Home and relationship well-being.  Sexual  activity.  Eating habits.  History of falls.  Memory and ability to understand (cognition).  Work and work Statistician. What immunizations do I need?  Influenza (flu) vaccine  This is recommended every year. Tetanus, diphtheria, and pertussis (Tdap) vaccine  You may need a Td booster every 10 years. Varicella (chickenpox) vaccine  You may need this vaccine if you have not already been vaccinated. Zoster (shingles) vaccine  You may need this after age 1. Pneumococcal conjugate (PCV13) vaccine  One dose is recommended after age 54. Pneumococcal polysaccharide (PPSV23) vaccine  One dose is recommended after age 61. Measles, mumps, and rubella (MMR) vaccine  You may need at least one dose of MMR if you were born in 1957 or later. You may also need a second dose. Meningococcal conjugate (MenACWY) vaccine  You may need this if you have certain conditions. Hepatitis A vaccine  You may need this if you have certain conditions or if you travel or work in places where you may be exposed to hepatitis A. Hepatitis B vaccine  You may need this if you have certain conditions or if you travel or work in places where you may be exposed to hepatitis B. Haemophilus influenzae type b (Hib) vaccine  You may need this if you have certain conditions. You may receive vaccines as individual doses or as more than one vaccine together in one shot (combination vaccines). Talk with your health care provider about the risks and benefits of combination  vaccines. What tests do I need? Blood tests  Lipid and cholesterol levels. These may be checked every 5 years, or more frequently depending on your overall health.  Hepatitis C test.  Hepatitis B test. Screening  Lung cancer screening. You may have this screening every year starting at age 58 if you have a 30-pack-year history of smoking and currently smoke or have quit within the past 15 years.  Colorectal cancer screening. All adults  should have this screening starting at age 45 and continuing until age 28. Your health care provider may recommend screening at age 48 if you are at increased risk. You will have tests every 1-10 years, depending on your results and the type of screening test.  Prostate cancer screening. Recommendations will vary depending on your family history and other risks.  Diabetes screening. This is done by checking your blood sugar (glucose) after you have not eaten for a while (fasting). You may have this done every 1-3 years.  Abdominal aortic aneurysm (AAA) screening. You may need this if you are a current or former smoker.  Sexually transmitted disease (STD) testing. Follow these instructions at home: Eating and drinking  Eat a diet that includes fresh fruits and vegetables, whole grains, lean protein, and low-fat dairy products. Limit your intake of foods with high amounts of sugar, saturated fats, and salt.  Take vitamin and mineral supplements as recommended by your health care provider.  Do not drink alcohol if your health care provider tells you not to drink.  If you drink alcohol: ? Limit how much you have to 0-2 drinks a day. ? Be aware of how much alcohol is in your drink. In the [place] one drink equals one 12 oz bottle of beer (355 mL), one 5 oz glass of wine (148 mL), or one 1 oz glass of hard liquor (44 mL). Lifestyle  Take daily care of your teeth and gums.  Stay active. Exercise for at least 30 minutes on 5 or more days each week.  Do not use any products that contain nicotine or tobacco, such as cigarettes, e-cigarettes, and chewing tobacco. If you need help quitting, ask your health care provider.  If you are sexually active, practice safe sex. Use a condom or other form of protection to prevent STIs (sexually transmitted infections).  Talk with your health care provider about taking a low-dose aspirin or statin. What's next?  Visit your health care provider once a year  for a well check visit.  Ask your health care provider how often you should have your eyes and teeth checked.  Stay up to date on all vaccines. This information is not intended to replace advice given to you by your health care provider. Make sure you discuss any questions you have with your health care provider. Document Revised: 03/13/2018 Document Reviewed: 03/13/2018 Elsevier Patient Education  2020 [employer].

## 2019-06-15 MED FILL — AMLODIPINE BESYLATE 5 MG TA: 5 | 30 days supply | Qty: 30 | Fill #5

## 2019-07-02 MED FILL — clonazePAM 0.5 MG TABS: 0.5 | 30 days supply | Qty: 30 | Fill #1

## 2019-07-06 ENCOUNTER — Other Ambulatory Visit: Payer: Self-pay | Admitting: Family Medicine

## 2019-07-06 DIAGNOSIS — I10 Essential (primary) hypertension: Secondary | ICD-10-CM

## 2019-07-06 MED FILL — CARBIDOPA-LEVO ER 25-100 TA: 25-100 | 90 days supply | Qty: 360 | Fill #1

## 2019-07-08 MED FILL — AMLODIPINE BESYLATE 5 MG TA: 5 | 30 days supply | Qty: 30 | Fill #0

## 2019-07-20 ENCOUNTER — Telehealth: Payer: Self-pay | Admitting: Neurology

## 2019-07-20 NOTE — Telephone Encounter (Signed)
-----   Message from Virl Son sent at 07/20/2019  3:33 PM EDT ----- She wanted to be able to go back with patient explained to her the visitor policy and she kept the appt for May and will do a virtual ----- Message ----- From: Ludwig Clarks, DO Sent: 07/20/2019   2:46 PM EDT To: Octaviano Batty  No but he can come in tomorrow at 1:30 so long as pt/wife understand visitor policy ----- Message ----- From: Marylen Ponto Sent: 07/20/2019  10:35 AM EDT To: Eustace Quail Kwadwo Taras, DO  Called pt for appt 08/12/19 to switch to vv. Wife states that he is having some issues and feels that they need to have an in person visit. Would you be okay with using a spot on 08/20/19 to work him in?

## 2019-07-29 ENCOUNTER — Other Ambulatory Visit: Payer: Self-pay | Admitting: Family Medicine

## 2019-07-29 MED FILL — CARBIDOPA-LEVO ER 50-200 TA: 50-200 | 90 days supply | Qty: 90 | Fill #1

## 2019-07-29 MED FILL — clonazePAM 0.5 MG TABS: 0.5 | 30 days supply | Qty: 30 | Fill #0

## 2019-07-29 NOTE — Telephone Encounter (Signed)
Requesting: klonopin  Contract:n/a UDS:n/a Last Visit:04/06/19 Next Visit:n/a Last Refill:06/01/19  Please Advise

## 2019-08-11 ENCOUNTER — Encounter: Payer: Self-pay | Admitting: Neurology
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
## 2019-08-12 ENCOUNTER — Encounter: Payer: Self-pay | Admitting: Neurology

## 2019-08-12 ENCOUNTER — Telehealth (INDEPENDENT_AMBULATORY_CARE_PROVIDER_SITE_OTHER): Payer: Medicare Other | Admitting: Neurology

## 2019-08-12 ENCOUNTER — Other Ambulatory Visit: Payer: Self-pay

## 2019-08-12 VITALS — BP 118/75 | HR 68 | Ht 72.0 in | Wt 230.0 lb

## 2019-08-12 DIAGNOSIS — G20 Parkinson's disease: Secondary | ICD-10-CM | POA: Diagnosis not present

## 2019-08-12 DIAGNOSIS — G4710 Hypersomnia, unspecified: Secondary | ICD-10-CM

## 2019-08-12 NOTE — Addendum Note (Signed)
Addended by: Ulice Brilliant T on: 08/12/2019 01:51 PM   Modules accepted: Orders

## 2019-08-18 ENCOUNTER — Emergency Department (HOSPITAL_BASED_OUTPATIENT_CLINIC_OR_DEPARTMENT_OTHER)
Admission: EM | Admit: 2019-08-18 | Discharge: 2019-08-18 | Disposition: A | Discharge status: Home / Self Care | Payer: Medicare Other | Attending: Emergency Medicine | Admitting: Emergency Medicine

## 2019-08-18 ENCOUNTER — Other Ambulatory Visit: Payer: Self-pay

## 2019-08-18 ENCOUNTER — Encounter (HOSPITAL_BASED_OUTPATIENT_CLINIC_OR_DEPARTMENT_OTHER): Payer: Self-pay | Admitting: Emergency Medicine

## 2019-08-18 DIAGNOSIS — Z79899 Other long term (current) drug therapy: Secondary | ICD-10-CM | POA: Diagnosis not present

## 2019-08-18 DIAGNOSIS — N189 Chronic kidney disease, unspecified: Secondary | ICD-10-CM | POA: Insufficient documentation

## 2019-08-18 DIAGNOSIS — R040 Epistaxis: Secondary | ICD-10-CM

## 2019-08-18 DIAGNOSIS — Z882 Allergy status to sulfonamides status: Secondary | ICD-10-CM | POA: Diagnosis not present

## 2019-08-18 DIAGNOSIS — Z886 Allergy status to analgesic agent status: Secondary | ICD-10-CM | POA: Diagnosis not present

## 2019-08-18 DIAGNOSIS — Z888 Allergy status to other drugs, medicaments and biological substances status: Secondary | ICD-10-CM | POA: Insufficient documentation

## 2019-08-18 DIAGNOSIS — Z85528 Personal history of other malignant neoplasm of kidney: Secondary | ICD-10-CM | POA: Insufficient documentation

## 2019-08-18 DIAGNOSIS — E785 Hyperlipidemia, unspecified: Secondary | ICD-10-CM | POA: Insufficient documentation

## 2019-08-18 DIAGNOSIS — G20 Parkinson's disease: Secondary | ICD-10-CM | POA: Insufficient documentation

## 2019-08-18 DIAGNOSIS — I129 Hypertensive chronic kidney disease with stage 1 through stage 4 chronic kidney disease, or unspecified chronic kidney disease: Secondary | ICD-10-CM | POA: Insufficient documentation

## 2019-08-18 DIAGNOSIS — Z7982 Long term (current) use of aspirin: Secondary | ICD-10-CM | POA: Diagnosis not present

## 2019-08-18 MED ORDER — LIDOCAINE-EPINEPHRINE 1 %-1:100000 IJ SOLN
INTRAMUSCULAR | Status: AC
  Filled 2019-08-18: qty 1

## 2019-08-18 MED ORDER — PHENYLEPHRINE HCL 0.5 % NA SOLN
NASAL | Status: AC
  Filled 2019-08-18: qty 15

## 2019-08-18 MED ORDER — TRANEXAMIC ACID 1000 MG/10ML IV SOLN
INTRAVENOUS | Status: AC
  Filled 2019-08-18: qty 10

## 2019-08-18 MED ORDER — CEPHALEXIN 500 MG PO CAPS: 500.0000 mg | ORAL_CAPSULE | Freq: Three times a day (TID) | ORAL | 0 refills | Status: DC

## 2019-08-18 MED FILL — CEPHALEXIN 500 MG CAPSULE: 500 | 7 days supply | Qty: 21 | Fill #0

## 2019-08-18 NOTE — ED Triage Notes (Signed)
Patient presents with complaints of nosebleed onset 0500 this am. Patient applying pressure at this time.

## 2019-08-18 NOTE — ED Notes (Signed)
Spoke to Dr. Kathrynn Humble regarding discharge instructions.  He states he spoke to ENT, Dr. Redmond Baseman.  He advised to keep nasal packing in place til Monday, and for patient to follow up Monday at his office.  Pt and wife given instructions, both related verbal understanding.

## 2019-08-18 NOTE — ED Notes (Signed)
Called (ENT) Dr Redmond Baseman @ [phone]

## 2019-08-18 NOTE — ED Provider Notes (Signed)
[facility] EMERGENCY DEPARTMENT Provider Note   CSN: [id number] Arrival date & time: 08/18/19  [id number]     History Chief Complaint  Patient presents with  . Epistaxis    Vincent Walker is a 74 y.o. male.  Patient with history of Parkinson's disease, CKD, hypertension, history of kidney cancer presenting with nosebleed that onset this morning at 5 AM.  Awoke her from sleep.  Seems to be worse on the left but is coming from both nares.  Denies any falls or trauma.  Denies any dizziness or lightheadedness.  He takes aspirin but no other anticoagulation.  History of significant nosebleed about 1 year ago that was ultimately cauterized by ENT.  Has never had packing in the past. Denies feeling dizzy or lightheadedness.  No chest pain or shortness of breath.  No recent nose trauma.  The history is provided by the patient and a relative.  Epistaxis Associated symptoms: no dizziness, no fever and no headaches        Past Medical History:  Diagnosis Date  . Anxiety state 12/23/2015  . Arthritis   . Benign essential HTN 02/08/2015  . Cancer [facility])    Kidney Cancer  . Chronic kidney disease   . Chronic renal insufficiency 02/14/2015  . Constipation 12/23/2015  . Depression   . Dyslipidemia 02/08/2015  . Glaucoma   . H/O renal cell carcinoma 02/08/2015  . History of blood transfusion 2010   After Kidney surgery  . History of chicken pox   . Hyperglycemia 12/23/2015  . Hyperlipidemia   . Hypertension   . Ingrown left big toenail 02/14/2015  . Left foot pain 02/14/2015  . Mitral valve prolapse   . MVP (mitral valve prolapse) 05/14/2016  . Neuromuscular disorder ([place])   . Parkinson disease ([place]) 12/23/2015  . Preventative health care 06/21/2016  . Thrombocytopenia ([place]) 02/08/2015  . Tremor of right hand 02/14/2015    Patient Active Problem List   Diagnosis Date Noted  . 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 Adenomatous polyp of ascending colon 10/09/2018  . Hx of colonic  polyps 10/07/2018  . Polyp of ascending colon 10/07/2018  . Epistaxis 07/07/2018  . Allergies 07/07/2018  . Obesity 06/15/2018  . Increased thyroid stimulating hormone (TSH) level 06/07/2017  . Right hip pain 12/12/2016  . Preventative health care 06/21/2016  . MVP (mitral valve prolapse) 05/14/2016  . Parkinson disease ([place]) 12/23/2015  . Constipation 12/23/2015  . Hyperglycemia 12/23/2015  . Anxiety state 12/23/2015  . Tremor of right hand 02/14/2015  . Left foot pain 02/14/2015  . Chronic renal insufficiency 02/14/2015  . Benign essential HTN 02/08/2015  . Dyslipidemia 02/08/2015  . H/O renal cell carcinoma 02/08/2015  . Thrombocytopenia ([place]) 02/08/2015  . History of chicken pox     Past Surgical History:  Procedure Laterality Date  . APPENDECTOMY  1995  . CATARACT EXTRACTION, BILATERAL    . COLONOSCOPY    . COLONOSCOPY WITH PROPOFOL N/A 12/17/2018   Procedure: COLONOSCOPY WITH PROPOFOL;  Surgeon: Rush Landmark Telford Nab., MD;  Location: [facility];  Service: Gastroenterology;  Laterality: N/A;  . ENDOSCOPIC MUCOSAL RESECTION N/A 12/17/2018   Procedure: ENDOSCOPIC MUCOSAL RESECTION;  Surgeon: Rush Landmark Telford Nab., MD;  Location: [facility];  Service: Gastroenterology;  Laterality: N/A;  . HEMOSTASIS CLIP PLACEMENT  12/17/2018   Procedure: HEMOSTASIS CLIP PLACEMENT;  Surgeon: Irving Copas., MD;  Location: [facility];  Service: Gastroenterology;;  . left knee scope  2003  . POLYPECTOMY  12/17/2018   Procedure:  POLYPECTOMY;  Surgeon: Rush Landmark Telford Nab., MD;  Location: [facility];  Service: Gastroenterology;;  . right knee scope  1999  . SUBMUCOSAL LIFTING INJECTION  12/17/2018   Procedure: SUBMUCOSAL LIFTING INJECTION;  Surgeon: Rush Landmark Telford Nab., MD;  Location: [facility];  Service: Gastroenterology;;  . TOE SURGERY Left    metal 2nd toe- and top of foot- straighten bone  . TONSILLECTOMY    . TOTAL KNEE ARTHROPLASTY Left 07/06/2014  . TOTAL  NEPHRECTOMY Right        Family History  Problem Relation Age of Onset  . Alcohol abuse Father   . Hyperlipidemia Father   . Hypertension Father   . Diabetes Father   . Heart disease Father   . Cancer Maternal Aunt        lung cancer  . Cancer Paternal Uncle        bone cancer  . Heart attack Mother   . Stroke Mother        swelling in brain stem  . Healthy Daughter   . Healthy Son   . Healthy Daughter   . Colon cancer Neg Hx   . Esophageal cancer Neg Hx   . Stomach cancer Neg Hx   . Inflammatory bowel disease Neg Hx   . Liver disease Neg Hx   . Pancreatic cancer Neg Hx   . Rectal cancer Neg Hx     Social History   Tobacco Use  . Smoking status: Former Smoker    Types: Pipe    Quit date: 11/18/1995    Years since quitting: 23.7  . Smokeless tobacco: Never Used  . Tobacco comment: stopped 20 years ago.  1996  Substance Use Topics  . Alcohol use: Yes    Alcohol/week: 0.0 standard drinks    Comment: rare/ social  . Drug use: No    Home Medications Prior to Admission medications   Medication Sig Start Date End Date Taking? Authorizing Provider  acetaminophen (TYLENOL) 500 MG tablet Take 1,000 mg by mouth every 6 (six) hours as needed (pain.).     [provider]  amLODipine (NORVASC) 5 MG tablet TAKE 1 TABLET BY MOUTH DAILY 07/06/19   Mosie Lukes, MD  aspirin EC 81 MG tablet Take 81 mg by mouth at bedtime.    [provider]  atenolol (TENORMIN) 25 MG tablet Take 1 tablet (25 mg total) by mouth every evening. 04/06/19   Mosie Lukes, MD  atorvastatin (LIPITOR) 20 MG tablet Take 1 tablet (20 mg total) by mouth daily. 02/23/19   Mosie Lukes, MD  brimonidine (ALPHAGAN) 0.2 % ophthalmic solution Place 1 drop into both eyes 2 (two) times daily.    [provider]  carbidopa-levodopa (SINEMET CR) 50-200 MG tablet TAKE 1 TABLET BY MOUTH AT BEDTIME. 05/01/19   Tat, Eustace Quail, DO  Carbidopa-Levodopa ER (SINEMET CR) 25-100 MG tablet  controlled release TAKE 2 TABLETS BY MOUTH EVERY MORNING THEN TAKE 1 TABLET BY MOUTH IN THE AFTERNOON AND 1 TABLET EVERY EVENING 03/31/19   Tat, Eustace Quail, DO  clonazePAM (KLONOPIN) 0.5 MG tablet TAKE 1 TABLET BY MOUTH AT BEDTIME 07/29/19   Mosie Lukes, MD  divalproex (DEPAKOTE ER) 250 MG 24 hr tablet TAKE 1 TABLET (250 MG TOTAL) BY MOUTH DAILY. 06/01/19   Tat, Eustace Quail, DO  Multiple Vitamins-Minerals (PRESERVISION AREDS 2 PO) Take 1 tablet by mouth daily.    [provider]  Polyethyl Glycol-Propyl Glycol (LUBRICANT EYE DROPS) 0.4-0.3 % SOLN Place  1 drop into both eyes 3 (three) times daily as needed (dry/irritated eyes.).    [provider]  sodium chloride (OCEAN) 0.65 % SOLN nasal spray Place 1 spray into both nostrils as needed for congestion. 07/07/18   Mosie Lukes, MD    Allergies    Nsaids, Sulfa antibiotics, and Tolmetin  Review of Systems   Review of Systems  Constitutional: Negative for activity change, appetite change and fever.  HENT: Positive for nosebleeds.   Respiratory: Negative for chest tightness and shortness of breath.   Cardiovascular: Negative for chest pain and leg swelling.  Gastrointestinal: Negative for abdominal pain, nausea and vomiting.  Genitourinary: Negative for dysuria and hematuria.  Musculoskeletal: Negative for arthralgias and myalgias.  Skin: Negative for rash.  Neurological: Negative for dizziness, weakness and headaches.   all other systems are negative except as noted in the HPI and PMH.    Physical Exam Updated Vital Signs BP (!) 161/71 (BP Location: Right Arm)   Pulse 71   Temp 98.2 F (36.8 C) (Oral)   Resp 18   Ht 6\' 3"  (1.905 m)   Wt 104 kg   SpO2 99%   BMI 28.66 kg/m   Physical Exam Vitals and nursing note reviewed.  Constitutional:      General: He is not in acute distress.    Appearance: He is well-developed.  HENT:     Head: Normocephalic and atraumatic.     Nose:     Comments: Oozing from L nare.    Holding pressure.  Blood in posterior pharynx    Mouth/Throat:     Pharynx: No oropharyngeal exudate.  Eyes:     Conjunctiva/sclera: Conjunctivae normal.     Pupils: Pupils are equal, round, and reactive to light.  Neck:     Comments: No meningismus. Cardiovascular:     Rate and Rhythm: Normal rate and regular rhythm.     Heart sounds: Normal heart sounds. No murmur.  Pulmonary:     Effort: Pulmonary effort is normal. No respiratory distress.     Breath sounds: Normal breath sounds.  Abdominal:     Palpations: Abdomen is soft.     Tenderness: There is no abdominal tenderness. There is no guarding or rebound.  Musculoskeletal:        General: No tenderness. Normal range of motion.     Cervical back: Normal range of motion and neck supple.  Skin:    General: Skin is warm.     Capillary Refill: Capillary refill takes less than 2 seconds.  Neurological:     General: No focal deficit present.     Mental Status: He is alert and oriented to person, place, and time. Mental status is at baseline.     Cranial Nerves: No cranial nerve deficit.     Motor: No abnormal muscle tone.     Coordination: Coordination normal.     Comments: 5/5 strength throughout. Alert and appropriate. Resting tremor  Psychiatric:        Behavior: Behavior normal.     ED Results / Procedures / Treatments   Labs (all labs ordered are listed, but only abnormal results are displayed) Labs Reviewed - No data to display  EKG None  Radiology No results found.  Procedures .Epistaxis Management  Date/Time: 08/18/2019 7:07 AM Performed by: Ezequiel Essex, MD Authorized by: Ezequiel Essex, MD   Consent:    Consent obtained:  Verbal   Consent given by:  Patient   Risks discussed:  Bleeding,  infection, nasal injury and pain   Alternatives discussed:  No treatment Anesthesia (see MAR for exact dosages):    Anesthesia method:  Topical application   Topical anesthetic:  Epinephrine and lidocaine  gel Procedure details:    Treatment site:  L posterior   Treatment method:  Anterior pack, nasal tampon and thrombin   Treatment complexity:  Extensive   Treatment episode: recurring   Post-procedure details:    Assessment:  Bleeding decreased   Patient tolerance of procedure:  Tolerated well, no immediate complications   (including critical care time)  Medications Ordered in ED Medications  phenylephrine (NEO-SYNEPHRINE) 0.5 % nasal solution (has no administration in time range)  tranexamic acid (CYKLOKAPRON) 1000 MG/10ML injection (has no administration in time range)  lidocaine-EPINEPHrine (XYLOCAINE W/EPI) 1 %-1:100000 (with pres) injection (has no administration in time range)    ED Course  I have reviewed the triage vital signs and the nursing notes.  Pertinent labs & imaging results that were available during my care of the patient were reviewed by me and considered in my medical decision making (see chart for details).    MDM Rules/Calculators/A&P                     Nosebleed since about 5 AM. Aspirin but no other blood thinner use  Vital stable.  No distress.  Patient given topical lidocaine with epinephrine and topical TXA.  Patient still with persistent bleeding.  No identifiable source of bleeding noted to cauterize. 7.5 cm Rhino Rocket was placed.  Patient tolerated well.  Will observe and reassess.  Care to be transferred at shift change. Anticipate discharge with ENT follow-up if no further bleeding and patient tolerating packing well.  Prophylactic antibiotics written. Final Clinical Impression(s) / ED Diagnoses Final diagnoses:  Left-sided epistaxis    Rx / DC Orders ED Discharge Orders    None       Taiten Brawn, Annie Main, MD 08/18/19 [phone]

## 2019-08-18 NOTE — Discharge Instructions (Addendum)
Keep the packing in place and follow-up with Dr. Laurance Flatten or different ear nose and throat doctor. Take the antibiotics as prescribed.  Return to the ED with new or worsening symptoms.

## 2019-08-20 DIAGNOSIS — R040 Epistaxis: Secondary | ICD-10-CM | POA: Diagnosis not present

## 2019-08-21 ENCOUNTER — Encounter: Payer: Self-pay | Admitting: Family Medicine

## 2019-08-21 ENCOUNTER — Other Ambulatory Visit: Payer: Self-pay | Admitting: Family Medicine

## 2019-08-21 DIAGNOSIS — R040 Epistaxis: Secondary | ICD-10-CM

## 2019-08-24 DIAGNOSIS — G20 Parkinson's disease: Secondary | ICD-10-CM | POA: Diagnosis not present

## 2019-08-24 DIAGNOSIS — R2689 Other abnormalities of gait and mobility: Secondary | ICD-10-CM | POA: Diagnosis not present

## 2019-08-26 ENCOUNTER — Other Ambulatory Visit (HOSPITAL_COMMUNITY)
Admission: RE | Admit: 2019-08-26 | Discharge: 2019-08-26 | Disposition: A | Discharge status: Home / Self Care | Payer: Medicare Other | Source: Ambulatory Visit | Attending: Internal Medicine | Admitting: Internal Medicine

## 2019-08-26 DIAGNOSIS — Z01812 Encounter for preprocedural laboratory examination: Secondary | ICD-10-CM | POA: Insufficient documentation

## 2019-08-26 DIAGNOSIS — Z20822 Contact with and (suspected) exposure to covid-19: Secondary | ICD-10-CM | POA: Diagnosis not present

## 2019-08-26 LAB — SARS CORONAVIRUS 2 (TAT 6-24 HRS): SARS Coronavirus 2: NEGATIVE

## 2019-08-28 ENCOUNTER — Other Ambulatory Visit: Payer: Self-pay

## 2019-08-28 ENCOUNTER — Ambulatory Visit (HOSPITAL_BASED_OUTPATIENT_CLINIC_OR_DEPARTMENT_OTHER): Payer: Medicare Other | Attending: Neurology | Admitting: Internal Medicine

## 2019-08-28 ENCOUNTER — Other Ambulatory Visit: Payer: Self-pay | Admitting: Family Medicine

## 2019-08-28 ENCOUNTER — Other Ambulatory Visit (INDEPENDENT_AMBULATORY_CARE_PROVIDER_SITE_OTHER): Payer: Medicare Other

## 2019-08-28 VITALS — Ht 75.0 in | Wt 230.0 lb

## 2019-08-28 DIAGNOSIS — G20 Parkinson's disease: Secondary | ICD-10-CM | POA: Diagnosis not present

## 2019-08-28 DIAGNOSIS — R400 Somnolence: Secondary | ICD-10-CM | POA: Insufficient documentation

## 2019-08-28 DIAGNOSIS — R0683 Snoring: Secondary | ICD-10-CM

## 2019-08-28 DIAGNOSIS — R040 Epistaxis: Secondary | ICD-10-CM | POA: Diagnosis not present

## 2019-08-28 DIAGNOSIS — R2689 Other abnormalities of gait and mobility: Secondary | ICD-10-CM | POA: Diagnosis not present

## 2019-08-28 LAB — CBC
HCT: 41.8 % (ref 39.0–52.0)
Hemoglobin: 14 g/dL (ref 13.0–17.0)
MCHC: 33.6 g/dL (ref 30.0–36.0)
MCV: 92.1 fl (ref 78.0–100.0)
Platelets: 190 10*3/uL (ref 150.0–400.0)
RBC: 4.54 Mil/uL (ref 4.22–5.81)
RDW: 13.6 % (ref 11.5–15.5)
WBC: 9.2 10*3/uL (ref 4.0–10.5)

## 2019-08-28 MED FILL — DIVALPROEX SOD ER 250 MG TA: 250 | 90 days supply | Qty: 90 | Fill #1

## 2019-08-28 MED FILL — ATENOLOL 25 MG TABS: 25 | 90 days supply | Qty: 90 | Fill #0

## 2019-08-28 MED FILL — ATORVASTATIN 20 MG TABLET: 20 | 30 days supply | Qty: 30 | Fill #0

## 2019-08-28 MED FILL — clonazePAM 0.5 MG TABS: 0.5 | 30 days supply | Qty: 30 | Fill #1

## 2019-08-29 LAB — IRON,TIBC AND FERRITIN PANEL
%SAT: 23 % (calc) (ref 20–48)
Ferritin: 122 ng/mL (ref 24–380)
Iron: 67 ug/dL (ref 50–180)
TIBC: 290 mcg/dL (calc) (ref 250–425)

## 2019-09-01 DIAGNOSIS — G20 Parkinson's disease: Secondary | ICD-10-CM | POA: Diagnosis not present

## 2019-09-01 DIAGNOSIS — R2689 Other abnormalities of gait and mobility: Secondary | ICD-10-CM | POA: Diagnosis not present

## 2019-09-02 MED FILL — AMLODIPINE BESYLATE 5 MG TA: 5 | 30 days supply | Qty: 30 | Fill #2

## 2019-09-04 DIAGNOSIS — G20 Parkinson's disease: Secondary | ICD-10-CM | POA: Diagnosis not present

## 2019-09-04 DIAGNOSIS — R2689 Other abnormalities of gait and mobility: Secondary | ICD-10-CM | POA: Diagnosis not present

## 2019-09-05 DIAGNOSIS — G4710 Hypersomnia, unspecified: Secondary | ICD-10-CM | POA: Diagnosis not present

## 2019-09-05 NOTE — Procedures (Signed)
    Patient Name: Vincent Walker, Waas Date: 08/28/2019 Gender: Male D.O.B: 1945/05/22 Age (years): 10 Referring Provider: Wells Guiles Tat Height (inches): 54 Interpreting Physician: Baird Lyons MD, ABSM Weight (lbs): 230 RPSGT: [facility] [place] BMI: 30 MRN: [id number] Neck Size: 18.00  CLINICAL INFORMATION Sleep Study Type: NPSG Indication for sleep study: Witnesses Apnea / Gasping During Sleep Epworth Sleepiness Score: 18  SLEEP STUDY TECHNIQUE As per the AASM Manual for the Scoring of Sleep and Associated Events v2.3 (April 2016) with a hypopnea requiring 4% desaturations.  The channels recorded and monitored were frontal, central and occipital EEG, electrooculogram (EOG), submentalis EMG (chin), nasal and oral airflow, thoracic and abdominal wall motion, anterior tibialis EMG, snore microphone, electrocardiogram, and pulse oximetry.  MEDICATIONS Medications self-administered by patient taken the night of the study : ASPIRIN, ATORVASTATIN, ATENOLOL, CARBIDOPA-LEVODOPA, CLONAZEPAM, DIVALPROEX SODIUM, SODIUM CHLORIDE  SLEEP ARCHITECTURE The study was initiated at 9:57:07 PM and ended at 5:09:11 AM.  Sleep onset time was 21.8 minutes and the sleep efficiency was 45.8%%. The total sleep time was 198 minutes.  Stage REM latency was N/A minutes.  The patient spent 10.4%% of the night in stage N1 sleep, 89.6%% in stage N2 sleep, 0.0%% in stage N3 and 0% in REM.  Alpha intrusion was absent.  Supine sleep was 24.75%.  RESPIRATORY PARAMETERS The overall apnea/hypopnea index (AHI) was 0.6 per hour. There were 1 total apneas, including 1 obstructive, 0 central and 0 mixed apneas. There were 1 hypopneas and 0 RERAs.  The AHI during Stage REM sleep was N/A per hour.  AHI while supine was 1.2 per hour.  The mean oxygen saturation was 92.9%. The minimum SpO2 during sleep was 90.0%.  moderate snoring was noted during this study.  CARDIAC DATA The 2 lead EKG demonstrated sinus rhythm.  The mean heart rate was 63.1 beats per minute. Other EKG findings include: Atrial Fibrillation, PVCs.  LEG MOVEMENT DATA The total PLMS were 0 with a resulting PLMS index of 0.0. Associated arousal with leg movement index was 0.0 .  IMPRESSIONS - Patient had difficulty maintaining sleep and was awake most of the time between 12:30 AM and 4:15 AM,  Total Sleep Time 198 minutes. - No significant obstructive sleep apnea occurred during this study (AHI = 0.6/h). - No significant central sleep apnea occurred during this study (CAI = 0.0/h). - The patient had minimal or no oxygen desaturation during the study (Min O2 = 90.0%) - The patient snored with moderate snoring volume. - EKG findings include Atrial Fibrillation, PVCs. - Clinically significant periodic limb movements did not occur during sleep.   DIAGNOSIS - Primary Snoring - Insomnia  RECOMMENDATIONS - If this sleep pattern is representative of home experience, consider managing as insomnia. - Sleep hygiene should be reviewed to assess factors that may improve sleep quality. - Weight management and regular exercise should be initiated or continued if appropriate.  [Electronically signed] 09/05/2019 11:23 AM  Baird Lyons MD, ABSM Diplomate, American Board of Sleep Medicine   NPI: [id number]                         [place], [place] [facility]  ELECTRONICALLY SIGNED ON:  09/05/2019, 11:18 AM [place] PH: [phone]   FX: [phone] [place]

## 2019-09-07 NOTE — Progress Notes (Signed)
Let pt/wife know that sleep study didn't show sleep apnea BUT he was also awake most of the study so may not have given very accurate information

## 2019-09-07 NOTE — Progress Notes (Signed)
Left message to call office back

## 2019-09-08 ENCOUNTER — Telehealth: Payer: Self-pay | Admitting: Neurology

## 2019-09-08 NOTE — Telephone Encounter (Signed)
Any further recommendations for him per wife/patient. To call back tomorrow due to excessive sleepiness during the day.

## 2019-09-08 NOTE — Telephone Encounter (Signed)
Unable to get thru on number listed. At 403pm

## 2019-09-08 NOTE — Telephone Encounter (Signed)
Will call in am, they are at beach.

## 2019-09-08 NOTE — Telephone Encounter (Signed)
Pt's wife is returning call to [place]. Please call.

## 2019-09-08 NOTE — Telephone Encounter (Signed)
Left message on voicemail this time

## 2019-09-08 NOTE — Telephone Encounter (Signed)
Unfortunately I don't.  I have changed all the Parkinsons Disease meds already.  He needs to f/u with PCP to see if something else going on.  Shouldn't drive though

## 2019-09-09 NOTE — Progress Notes (Signed)
Patient advised and will follow up with PCP.

## 2019-09-09 NOTE — Telephone Encounter (Signed)
Spoke with patient, he will follow up with PCP.

## 2019-09-14 DIAGNOSIS — R2689 Other abnormalities of gait and mobility: Secondary | ICD-10-CM | POA: Diagnosis not present

## 2019-09-14 DIAGNOSIS — G20 Parkinson's disease: Secondary | ICD-10-CM | POA: Diagnosis not present

## 2019-09-28 ENCOUNTER — Other Ambulatory Visit: Payer: Self-pay | Admitting: Neurology

## 2019-09-28 MED FILL — AMLODIPINE BESYLATE 5 MG TA: 5 | 30 days supply | Qty: 30 | Fill #3

## 2019-09-28 MED FILL — CARBIDOPA-LEVO ER 25-100 TA: 25-100 | 90 days supply | Qty: 360 | Fill #0

## 2019-09-28 NOTE — Telephone Encounter (Signed)
Rx(s) sent to pharmacy electronically.  

## 2019-10-01 ENCOUNTER — Other Ambulatory Visit: Payer: Self-pay | Admitting: Family Medicine

## 2019-10-01 MED FILL — ATORVASTATIN 20 MG TABLET: 20 | 90 days supply | Qty: 90 | Fill #0

## 2019-10-02 MED FILL — clonazePAM 0.5 MG TABS: 0.5 | 30 days supply | Qty: 30 | Fill #0

## 2019-10-02 NOTE — Telephone Encounter (Signed)
Can you call to get him set up for a follow up.

## 2019-10-02 NOTE — Telephone Encounter (Signed)
I refilled one month but he is approaching 6 month mark please set him up for follow up before next refill

## 2019-10-07 NOTE — Telephone Encounter (Signed)
Called left detailed messaged that overdue for an appt before next refill

## 2019-10-13 DIAGNOSIS — R2689 Other abnormalities of gait and mobility: Secondary | ICD-10-CM | POA: Diagnosis not present

## 2019-10-13 DIAGNOSIS — G20 Parkinson's disease: Secondary | ICD-10-CM | POA: Diagnosis not present

## 2019-10-20 DIAGNOSIS — G20 Parkinson's disease: Secondary | ICD-10-CM | POA: Diagnosis not present

## 2019-10-20 DIAGNOSIS — R2689 Other abnormalities of gait and mobility: Secondary | ICD-10-CM | POA: Diagnosis not present

## 2019-10-27 ENCOUNTER — Other Ambulatory Visit: Payer: Self-pay | Admitting: Family Medicine

## 2019-10-27 ENCOUNTER — Other Ambulatory Visit: Payer: Self-pay | Admitting: Neurology

## 2019-10-27 DIAGNOSIS — R2689 Other abnormalities of gait and mobility: Secondary | ICD-10-CM | POA: Diagnosis not present

## 2019-10-27 DIAGNOSIS — G20 Parkinson's disease: Secondary | ICD-10-CM | POA: Diagnosis not present

## 2019-10-27 MED FILL — CARBIDOPA-LEVO ER 50-200 TA: 50-200 | 90 days supply | Qty: 90 | Fill #0

## 2019-10-27 NOTE — Telephone Encounter (Signed)
I sent in one refill but patient has not been seen since January so he will need an appt for any further refills.

## 2019-10-30 DIAGNOSIS — R2689 Other abnormalities of gait and mobility: Secondary | ICD-10-CM | POA: Diagnosis not present

## 2019-10-30 DIAGNOSIS — G20 Parkinson's disease: Secondary | ICD-10-CM | POA: Diagnosis not present

## 2019-10-30 MED FILL — clonazePAM 0.5 MG TABS: 0.5 | 30 days supply | Qty: 30 | Fill #0

## 2019-10-30 MED FILL — AMLODIPINE BESYLATE 5 MG TA: 5 | 30 days supply | Qty: 30 | Fill #4

## 2019-11-03 DIAGNOSIS — G20 Parkinson's disease: Secondary | ICD-10-CM | POA: Diagnosis not present

## 2019-11-03 DIAGNOSIS — R2689 Other abnormalities of gait and mobility: Secondary | ICD-10-CM | POA: Diagnosis not present

## 2019-11-04 ENCOUNTER — Telehealth: Payer: Self-pay | Admitting: Family Medicine

## 2019-11-04 NOTE — Telephone Encounter (Signed)
So see if they can tell you how low his blood pressure is dropping? What is the highest pressure he has had this week? What symptoms he has when it drops. Have him hold the Amlodipine for next 2 days and let us know how the pressure is doing. Also remind him to drink at least 60 ounces of water a day and consider adding 1 8-10 ounce drink of Gatorade or Pedialyte daily

## 2019-11-04 NOTE — Telephone Encounter (Signed)
Caller : Mrs.Marven   Call Back # [phone]  Patient states blood pressure is dropping and would like Charlett Blake to know that when standing blood pressure drops. Per wife she is a retired Marine scientist and she would keep a eye it and call back if she bring in.

## 2019-11-05 ENCOUNTER — Other Ambulatory Visit: Payer: Self-pay | Admitting: Family Medicine

## 2019-11-05 ENCOUNTER — Other Ambulatory Visit: Payer: Self-pay

## 2019-11-05 ENCOUNTER — Telehealth (INDEPENDENT_AMBULATORY_CARE_PROVIDER_SITE_OTHER): Payer: Medicare Other | Admitting: Family Medicine

## 2019-11-05 DIAGNOSIS — R739 Hyperglycemia, unspecified: Secondary | ICD-10-CM

## 2019-11-05 DIAGNOSIS — I10 Essential (primary) hypertension: Secondary | ICD-10-CM | POA: Diagnosis not present

## 2019-11-05 DIAGNOSIS — E785 Hyperlipidemia, unspecified: Secondary | ICD-10-CM

## 2019-11-05 DIAGNOSIS — N189 Chronic kidney disease, unspecified: Secondary | ICD-10-CM | POA: Diagnosis not present

## 2019-11-05 MED ORDER — AMLODIPINE BESYLATE 2.5 MG PO TABS: 1.2500 mg | ORAL_TABLET | Freq: Every day | ORAL | 3 refills | Status: DC

## 2019-11-05 MED FILL — AMLODIPINE 2.5 MG TABLET: 2.5 | 30 days supply | Qty: 30 | Fill #0

## 2019-11-05 NOTE — Assessment & Plan Note (Addendum)
Patient with concerns regarding his blood pressure recently, his blood pressure has been dropping upon arising. He has had BP as high as 140/80 when they held his Amlodipine. When taking a whole tab he would drop to 90/60 with arising. They moved to 1/2 tab and he tolerated better but still dropped. Will change his tab to 2.5 mg and they will continue to hydrate well, make sure to always eat protein when eating carbs every 4-5 hours and they will try 1.25 daily or bid as needed and as tolerated and report symptoms and numbers in a week or so. Call sooner if any concerns arise. Discussed symptoms and plan of care for 20 minutes

## 2019-11-05 NOTE — Assessment & Plan Note (Signed)
hgba1c acceptable, minimize simple carbs. Increase exercise as tolerated.  

## 2019-11-05 NOTE — Progress Notes (Signed)
Virtual Visit via Video Note  I connected with Vincent Walker on 11/05/19 at 10:40 AM EDT by a video enabled telemedicine application and verified that I am speaking with the correct person using two identifiers.  Location: Patient: home, patient and provider in visit Provider: home   I discussed the limitations of evaluation and management by telemedicine and the availability of in person appointments. The patient expressed understanding and agreed to proceed. Kem Boroughs, CMA was able to get the patient set up on a video visit   Subjective:    Patient ID: Vincent Walker, male    DOB: 1945/12/04, 74 y.o.   MRN: [id number]  Chief Complaint  Patient presents with  . blood pressure concerns    HPI Patient is in today for reevaluation of his blood pressure. Which has been dropping at times when he arises. Patient with concerns regarding his blood pressure recently, his blood pressure has been dropping upon arising. He has had BP as high as 140/80 when they held his Amlodipine. When taking a whole tab he would drop to 90/60 with arising. They moved to 1/2 tab and he tolerated better but still dropped. He feels woozey but no falls or syncope. Denies CP/palp/SOB/HA/congestion/fevers/GI or GU c/o. Taking meds as prescribed  Past Medical History:  Diagnosis Date  . Anxiety state 12/23/2015  . Arthritis   . Benign essential HTN 02/08/2015  . Cancer [facility])    Kidney Cancer  . Chronic kidney disease   . Chronic renal insufficiency 02/14/2015  . Constipation 12/23/2015  . Depression   . Dyslipidemia 02/08/2015  . Glaucoma   . H/O renal cell carcinoma 02/08/2015  . History of blood transfusion 2010   After Kidney surgery  . History of chicken pox   . Hyperglycemia 12/23/2015  . Hyperlipidemia   . Hypertension   . Ingrown left big toenail 02/14/2015  . Left foot pain 02/14/2015  . Mitral valve prolapse   . MVP (mitral valve prolapse) 05/14/2016  . Neuromuscular disorder ([place])   .  Parkinson disease ([place]) 12/23/2015  . Preventative health care 06/21/2016  . Thrombocytopenia ([place]) 02/08/2015  . Tremor of right hand 02/14/2015    Past Surgical History:  Procedure Laterality Date  . APPENDECTOMY  1995  . CATARACT EXTRACTION, BILATERAL    . COLONOSCOPY    . COLONOSCOPY WITH PROPOFOL N/A 12/17/2018   Procedure: COLONOSCOPY WITH PROPOFOL;  Surgeon: Rush Landmark Telford Nab., MD;  Location: [place];  Service: Gastroenterology;  Laterality: N/A;  . ENDOSCOPIC MUCOSAL RESECTION N/A 12/17/2018   Procedure: ENDOSCOPIC MUCOSAL RESECTION;  Surgeon: Rush Landmark Telford Nab., MD;  Location: [place];  Service: Gastroenterology;  Laterality: N/A;  . HEMOSTASIS CLIP PLACEMENT  12/17/2018   Procedure: HEMOSTASIS CLIP PLACEMENT;  Surgeon: Irving Copas., MD;  Location: [place];  Service: Gastroenterology;;  . left knee scope  2003  . POLYPECTOMY  12/17/2018   Procedure: POLYPECTOMY;  Surgeon: Mansouraty, Telford Nab., MD;  Location: [place];  Service: Gastroenterology;;  . right knee scope  1999  . SUBMUCOSAL LIFTING INJECTION  12/17/2018   Procedure: SUBMUCOSAL LIFTING INJECTION;  Surgeon: Rush Landmark Telford Nab., MD;  Location: [place];  Service: Gastroenterology;;  . TOE SURGERY Left    metal 2nd toe- and top of foot- straighten bone  . TONSILLECTOMY    . TOTAL KNEE ARTHROPLASTY Left 07/06/2014  . TOTAL NEPHRECTOMY Right     Family History  Problem Relation Age of Onset  . Alcohol abuse Father   . Hyperlipidemia  Father   . Hypertension Father   . Diabetes Father   . Heart disease Father   . Cancer Maternal Aunt        lung cancer  . Cancer Paternal Uncle        bone cancer  . Heart attack Mother   . Stroke Mother        swelling in brain stem  . Healthy Daughter   . Healthy Son   . Healthy Daughter   . Colon cancer Neg Hx   . Esophageal cancer Neg Hx   . Stomach cancer Neg Hx   . Inflammatory bowel disease Neg Hx   . Liver disease Neg Hx     . Pancreatic cancer Neg Hx   . Rectal cancer Neg Hx     Social History   Socioeconomic History  . Marital status: Married    Spouse name: Not on file  . Number of children: 3  . Years of education: 16  . Highest education level: Bachelor's degree (e.g., BA, AB, BS)  Occupational History  . Occupation: retired Freight forwarder in [employer]  Tobacco Use  . Smoking status: Former Smoker    Types: Pipe    Quit date: 11/18/1995    Years since quitting: 23.9  . Smokeless tobacco: Never Used  . Tobacco comment: stopped 20 years ago.  1996  Vaping Use  . Vaping Use: Never used  Substance and Sexual Activity  . Alcohol use: Yes    Alcohol/week: 0.0 standard drinks    Comment: rare/ social  . Drug use: No  . Sexual activity: Yes    Comment: lives with wife, retired from Naval architect in plant, no major dietary restrictions   Other Topics Concern  . Not on file  Social History Narrative  . Not on file   Social Determinants of Health   Financial Resource Strain: Low Risk   . Difficulty of Paying Living Expenses: Not hard at all  Food Insecurity: No Food Insecurity  . Worried About Charity fundraiser in the Last Year: Never true  . Ran Out of Food in the Last Year: Never true  Transportation Needs: No Transportation Needs  . Lack of Transportation (Medical): No  . Lack of Transportation (Non-Medical): No  Physical Activity:   . Days of Exercise per Week:   . Minutes of Exercise per Session:   Stress:   . Feeling of Stress :   Social Connections:   . Frequency of Communication with Friends and Family:   . Frequency of Social Gatherings with Friends and Family:   . Attends Religious Services:   . Active Member of Clubs or Organizations:   . Attends Archivist Meetings:   Marland Kitchen Marital Status:   Intimate Partner Violence:   . Fear of Current or Ex-Partner:   . Emotionally Abused:   Marland Kitchen Physically Abused:   . Sexually Abused:     Outpatient  Medications Prior to Visit  Medication Sig Dispense Refill  . acetaminophen (TYLENOL) 500 MG tablet Take 1,000 mg by mouth every 6 (six) hours as needed (pain.).     Marland Kitchen aspirin EC 81 MG tablet Take 81 mg by mouth at bedtime.    Marland Kitchen atenolol (TENORMIN) 25 MG tablet TAKE 1 TABLET (25 MG TOTAL) BY MOUTH EVERY EVENING. 90 tablet 0  . atorvastatin (LIPITOR) 20 MG tablet Take 1 tablet (20 mg total) by mouth daily. 90 tablet 0  . brimonidine (ALPHAGAN) 0.2 % ophthalmic solution Place  1 drop into both eyes 2 (two) times daily.    . carbidopa-levodopa (SINEMET CR) 50-200 MG tablet TAKE 1 TABLET BY MOUTH AT BEDTIME. 90 tablet 1  . Carbidopa-Levodopa ER (SINEMET CR) 25-100 MG tablet controlled release TAKE 2 TABLETS BY MOUTH EVERY MORNING THEN TAKE 1 TABLET BY MOUTH IN THE AFTERNOON AND 1 TABLET EVERY EVENING 360 tablet 1  . clonazePAM (KLONOPIN) 0.5 MG tablet TAKE 1 TABLET BY MOUTH AT BEDTIME 30 tablet 0  . divalproex (DEPAKOTE ER) 250 MG 24 hr tablet TAKE 1 TABLET (250 MG TOTAL) BY MOUTH DAILY. 90 tablet 1  . Multiple Vitamins-Minerals (PRESERVISION AREDS 2 PO) Take 1 tablet by mouth daily.    Vladimir Faster Glycol-Propyl Glycol (LUBRICANT EYE DROPS) 0.4-0.3 % SOLN Place 1 drop into both eyes 3 (three) times daily as needed (dry/irritated eyes.).    Marland Kitchen sodium chloride (OCEAN) 0.65 % SOLN nasal spray Place 1 spray into both nostrils as needed for congestion. 88 mL 2  . amLODipine (NORVASC) 5 MG tablet TAKE 1 TABLET BY MOUTH DAILY 30 tablet 5  . cephALEXin (KEFLEX) 500 MG capsule Take 1 capsule (500 mg total) by mouth 3 (three) times daily. 21 capsule 0   No facility-administered medications prior to visit.    Allergies  Allergen Reactions  . Nsaids   . Sulfa Antibiotics Other (See Comments)    Had a reaction as a child  . Tolmetin Other (See Comments)    Review of Systems  Constitutional: Negative for fever and malaise/fatigue.  HENT: Negative for congestion.   Eyes: Negative for blurred vision.   Respiratory: Negative for shortness of breath.   Cardiovascular: Negative for chest pain, palpitations and leg swelling.  Gastrointestinal: Negative for abdominal pain, blood in stool and nausea.  Genitourinary: Negative for dysuria and frequency.  Musculoskeletal: Negative for falls.  Skin: Negative for rash.  Neurological: Positive for dizziness. Negative for loss of consciousness and headaches.  Endo/Heme/Allergies: Negative for environmental allergies.  Psychiatric/Behavioral: Negative for depression. The patient is not nervous/anxious.        Objective:    Physical Exam Constitutional:      Appearance: Normal appearance.  HENT:     Head: Normocephalic and atraumatic.     Right Ear: External ear normal.     Left Ear: External ear normal.     Nose: Nose normal.  Eyes:     General:        Right eye: No discharge.        Left eye: No discharge.  Pulmonary:     Effort: Pulmonary effort is normal.  Neurological:     Mental Status: He is alert and oriented to person, place, and time.     Gait: Gait normal.  Psychiatric:        Behavior: Behavior normal.     There were no vitals taken for this visit. Wt Readings from Last 3 Encounters:  08/28/19 230 lb (104.3 kg)  08/18/19 229 lb 4.5 oz (104 kg)  08/11/19 230 lb (104.3 kg)    Diabetic Foot Exam - Simple   No data filed     Lab Results  Component Value Date   WBC 9.2 08/28/2019   HGB 14.0 08/28/2019   HCT 41.8 08/28/2019   PLT 190.0 08/28/2019   GLUCOSE 105 (H) 04/06/2019   CHOL 123 04/06/2019   TRIG 147.0 04/06/2019   HDL 46.40 04/06/2019   LDLCALC 47 04/06/2019   ALT 12 04/06/2019   AST 18 04/06/2019  NA 139 04/06/2019   K 4.6 04/06/2019   CL 103 04/06/2019   CREATININE 1.42 04/06/2019   BUN 20 04/06/2019   CO2 26 04/06/2019   TSH 2.45 04/06/2019   INR 1.1 (H) 12/12/2018   HGBA1C 6.0 04/07/2019    Lab Results  Component Value Date   TSH 2.45 04/06/2019   Lab Results  Component Value Date    WBC 9.2 08/28/2019   HGB 14.0 08/28/2019   HCT 41.8 08/28/2019   MCV 92.1 08/28/2019   PLT 190.0 08/28/2019   Lab Results  Component Value Date   NA 139 04/06/2019   K 4.6 04/06/2019   CO2 26 04/06/2019   GLUCOSE 105 (H) 04/06/2019   BUN 20 04/06/2019   CREATININE 1.42 04/06/2019   BILITOT 0.7 04/06/2019   ALKPHOS 85 04/06/2019   AST 18 04/06/2019   ALT 12 04/06/2019   PROT 6.9 04/06/2019   ALBUMIN 4.6 04/06/2019   CALCIUM 9.6 04/06/2019   GFR 48.48 (L) 04/06/2019   Lab Results  Component Value Date   CHOL 123 04/06/2019   Lab Results  Component Value Date   HDL 46.40 04/06/2019   Lab Results  Component Value Date   LDLCALC 47 04/06/2019   Lab Results  Component Value Date   TRIG 147.0 04/06/2019   Lab Results  Component Value Date   CHOLHDL 3 04/06/2019   Lab Results  Component Value Date   HGBA1C 6.0 04/07/2019       Assessment & Plan:   Problem List Items Addressed This Visit    Benign essential HTN    Patient with concerns regarding his blood pressure recently, his blood pressure has been dropping upon arising. He has had BP as high as 140/80 when they held his Amlodipine. When taking a whole tab he would drop to 90/60 with arising. They moved to 1/2 tab and he tolerated better but still dropped. Will change his tab to 2.5 mg and they will continue to hydrate well, make sure to always eat protein when eating carbs every 4-5 hours and they will try 1.25 daily or bid as needed and as tolerated and report symptoms and numbers in a week or so. Call sooner if any concerns arise. Discussed symptoms and plan of care for 20 minutes       Relevant Medications   amLODipine (NORVASC) 2.5 MG tablet   Dyslipidemia    Encouraged heart healthy diet, increase exercise, avoid trans fats, consider a krill oil cap daily      Chronic renal insufficiency    Hydrate and monitor      Hyperglycemia    hgba1c acceptable, minimize simple carbs. Increase exercise as  tolerated.          I have discontinued Margart Sickles. Hodge's amLODipine and cephALEXin. I am also having him start on amLODipine. Additionally, I am having him maintain his acetaminophen, sodium chloride, aspirin EC, brimonidine, Lubricant Eye Drops, divalproex, Multiple Vitamins-Minerals (PRESERVISION AREDS 2 PO), atenolol, Carbidopa-Levodopa ER, atorvastatin, clonazePAM, and carbidopa-levodopa.  Meds ordered this encounter  Medications  . amLODipine (NORVASC) 2.5 MG tablet    Sig: Take 0.5-1 tablets (1.25-2.5 mg total) by mouth daily.    Dispense:  30 tablet    Refill:  3     I discussed the assessment and treatment plan with the patient. The patient was provided an opportunity to ask questions and all were answered. The patient agreed with the plan and demonstrated an understanding of the instructions.  The patient was advised to call back or seek an in-person evaluation if the symptoms worsen or if the condition fails to improve as anticipated.  I provided 20 minutes of non-face-to-face time during this encounter.   [name], MD

## 2019-11-05 NOTE — Assessment & Plan Note (Signed)
Encouraged heart healthy diet, increase exercise, avoid trans fats, consider a krill oil cap daily 

## 2019-11-05 NOTE — Assessment & Plan Note (Signed)
Hydrate and monitor 

## 2019-11-05 NOTE — Telephone Encounter (Signed)
Patient has an appointment today and will discuss

## 2019-11-13 DIAGNOSIS — G20 Parkinson's disease: Secondary | ICD-10-CM | POA: Diagnosis not present

## 2019-11-13 DIAGNOSIS — R2689 Other abnormalities of gait and mobility: Secondary | ICD-10-CM | POA: Diagnosis not present

## 2019-11-17 DIAGNOSIS — R2689 Other abnormalities of gait and mobility: Secondary | ICD-10-CM | POA: Diagnosis not present

## 2019-11-17 DIAGNOSIS — G20 Parkinson's disease: Secondary | ICD-10-CM | POA: Diagnosis not present

## 2019-11-20 DIAGNOSIS — G20 Parkinson's disease: Secondary | ICD-10-CM | POA: Diagnosis not present

## 2019-11-20 DIAGNOSIS — R2689 Other abnormalities of gait and mobility: Secondary | ICD-10-CM | POA: Diagnosis not present

## 2019-11-24 DIAGNOSIS — G20 Parkinson's disease: Secondary | ICD-10-CM | POA: Diagnosis not present

## 2019-11-24 DIAGNOSIS — R2689 Other abnormalities of gait and mobility: Secondary | ICD-10-CM | POA: Diagnosis not present

## 2019-11-25 ENCOUNTER — Other Ambulatory Visit: Payer: Self-pay | Admitting: Neurology

## 2019-11-25 MED FILL — DIVALPROEX SOD ER 250 MG TA: 250 | 90 days supply | Qty: 90 | Fill #0

## 2019-11-25 NOTE — Telephone Encounter (Signed)
Rx(s) sent to pharmacy electronically.  

## 2019-11-27 ENCOUNTER — Other Ambulatory Visit: Payer: Self-pay | Admitting: Family Medicine

## 2019-11-27 DIAGNOSIS — R2689 Other abnormalities of gait and mobility: Secondary | ICD-10-CM | POA: Diagnosis not present

## 2019-11-27 DIAGNOSIS — G20 Parkinson's disease: Secondary | ICD-10-CM | POA: Diagnosis not present

## 2019-11-27 NOTE — Telephone Encounter (Signed)
Last written: 10/27/19 Last ov: 04/06/19 Next ov: none Contract: none UDS: none

## 2019-11-28 NOTE — Telephone Encounter (Signed)
He is at the 6 month mark so I refilled a 30 day supply but he will need an appt and UDS for further refills

## 2019-11-30 MED FILL — clonazePAM 0.5 MG TABS: 0.5 | 30 days supply | Qty: 30 | Fill #0

## 2019-11-30 MED FILL — AMLODIPINE 2.5 MG TABLET: 2.5 | 30 days supply | Qty: 30 | Fill #1

## 2019-11-30 NOTE — Telephone Encounter (Signed)
Patient wife states that Dr. Carles Collet usually writes this for him and that the pharmacy must have sent to wrong place.  I advised that if we will fill the the next time then we will need to discuss the UDS and Contract.

## 2019-11-30 NOTE — Telephone Encounter (Signed)
Left voicemail message.

## 2019-12-01 ENCOUNTER — Encounter: Payer: Self-pay | Admitting: Family Medicine

## 2019-12-01 DIAGNOSIS — R2689 Other abnormalities of gait and mobility: Secondary | ICD-10-CM | POA: Diagnosis not present

## 2019-12-01 DIAGNOSIS — G20 Parkinson's disease: Secondary | ICD-10-CM | POA: Diagnosis not present

## 2019-12-02 ENCOUNTER — Other Ambulatory Visit: Payer: Self-pay | Admitting: Family Medicine

## 2019-12-02 MED FILL — ATENOLOL 25 MG TABS: 25 | 90 days supply | Qty: 90 | Fill #0

## 2019-12-03 DIAGNOSIS — R2689 Other abnormalities of gait and mobility: Secondary | ICD-10-CM | POA: Diagnosis not present

## 2019-12-03 DIAGNOSIS — G20 Parkinson's disease: Secondary | ICD-10-CM | POA: Diagnosis not present

## 2019-12-08 DIAGNOSIS — G20 Parkinson's disease: Secondary | ICD-10-CM | POA: Diagnosis not present

## 2019-12-08 DIAGNOSIS — R2689 Other abnormalities of gait and mobility: Secondary | ICD-10-CM | POA: Diagnosis not present

## 2019-12-11 DIAGNOSIS — R2689 Other abnormalities of gait and mobility: Secondary | ICD-10-CM | POA: Diagnosis not present

## 2019-12-11 DIAGNOSIS — G20 Parkinson's disease: Secondary | ICD-10-CM | POA: Diagnosis not present

## 2019-12-15 DIAGNOSIS — R2689 Other abnormalities of gait and mobility: Secondary | ICD-10-CM | POA: Diagnosis not present

## 2019-12-15 DIAGNOSIS — G20 Parkinson's disease: Secondary | ICD-10-CM | POA: Diagnosis not present

## 2019-12-18 DIAGNOSIS — R2689 Other abnormalities of gait and mobility: Secondary | ICD-10-CM | POA: Diagnosis not present

## 2019-12-18 DIAGNOSIS — G20 Parkinson's disease: Secondary | ICD-10-CM | POA: Diagnosis not present

## 2019-12-22 DIAGNOSIS — D485 Neoplasm of uncertain behavior of skin: Secondary | ICD-10-CM | POA: Diagnosis not present

## 2019-12-22 DIAGNOSIS — C44729 Squamous cell carcinoma of skin of left lower limb, including hip: Secondary | ICD-10-CM | POA: Diagnosis not present

## 2019-12-25 DIAGNOSIS — R2689 Other abnormalities of gait and mobility: Secondary | ICD-10-CM | POA: Diagnosis not present

## 2019-12-25 DIAGNOSIS — G20 Parkinson's disease: Secondary | ICD-10-CM | POA: Diagnosis not present

## 2019-12-28 ENCOUNTER — Other Ambulatory Visit: Payer: Self-pay | Admitting: Family Medicine

## 2019-12-28 MED FILL — CARBIDOPA-LEVO ER 25-100 TA: 25-100 | 90 days supply | Qty: 360 | Fill #1

## 2019-12-28 MED FILL — AMLODIPINE 2.5 MG TABLET: 2.5 | 30 days supply | Qty: 30 | Fill #2

## 2019-12-28 MED FILL — ATORVASTATIN CALCIUM 20 MG: 20 | 90 days supply | Qty: 90 | Fill #0

## 2019-12-29 DIAGNOSIS — G20 Parkinson's disease: Secondary | ICD-10-CM | POA: Diagnosis not present

## 2019-12-29 DIAGNOSIS — R2689 Other abnormalities of gait and mobility: Secondary | ICD-10-CM | POA: Diagnosis not present

## 2020-01-01 DIAGNOSIS — R2689 Other abnormalities of gait and mobility: Secondary | ICD-10-CM | POA: Diagnosis not present

## 2020-01-01 DIAGNOSIS — G20 Parkinson's disease: Secondary | ICD-10-CM | POA: Diagnosis not present

## 2020-01-05 DIAGNOSIS — C44729 Squamous cell carcinoma of skin of left lower limb, including hip: Secondary | ICD-10-CM | POA: Diagnosis not present

## 2020-01-16 ENCOUNTER — Ambulatory Visit: Payer: Medicare Other | Attending: Internal Medicine

## 2020-01-16 ENCOUNTER — Other Ambulatory Visit: Payer: Self-pay

## 2020-01-16 DIAGNOSIS — Z23 Encounter for immunization: Secondary | ICD-10-CM

## 2020-01-16 NOTE — Progress Notes (Signed)
   Covid-19 Vaccination Clinic  Name:  KEVAUGHN EWING    MRN: [id number] DOB: April 22, 1945  01/16/2020  Mr. Sondgeroth was observed post Covid-19 immunization for 15 minutes without incident. He was provided with Vaccine Information Sheet and instruction to access the V-Safe system.   Mr. Beckerman was instructed to call 911 with any severe reactions post vaccine: Marland Kitchen Difficulty breathing  . Swelling of face and throat  . A fast heartbeat  . A bad rash all over body  . Dizziness and weakness

## 2020-01-25 NOTE — Progress Notes (Signed)
Assessment/Plan:   1.  Parkinsons Disease  -Increase carbidopa/levodopa 25/100 CR, 2 at 6am/2 at 10am/1 at 2pm/1 at 6pm.  Can take extra if needed in middle of night.  Needs to be more vigilant about taking meds on time.  -Continue carbidopa/levodopa 50/200 at bedtime  -restart PT when [facility] re-credentialed with [employer]. 2.  RBD/RLS/insomnia  -On clonazepam 0.5 mg, half tablet at bedtime.  Tried to stop this medication and things got much worse, especially mood change and daytime hypersomnolence did not get better.  -add mirtazapine, 15 mg q hs.  R/B/SE were discussed.  The opportunity to ask questions was given and they were answered to the best of my ability.  The patient expressed understanding and willingness to follow the outlined treatment protocols. 3.  Anxiety/depression  -Being followed by primary care.  On Depakote ER, 250 mg daily.  Did offer referral to psychiatry. 4.  Daytime hypersomnolence  -Nocturnal polysomnogram was negative, but he was awake most of the night so not sure that it was accurate 5.  Memory change  -discussed neurocog testing - pt declined  -discussed driving eval and he declined so it was recommend that he not drive at all and he agreed   Subjective:   Vincent Walker was seen today in follow up for Parkinsons disease.  My previous records were reviewed prior to todays visit as well as outside records available to me. Pt with wife who supplements hx.  Pt awakening between 4-5 am and having tremor so starts his AM meds then and then finds that meds don't last long enough but also missing meds in the day and taking 2nd dose at 5pm.  Having "legs jumping" at bed.  He takes a full tablet of klonopin every 4-5th night but not every night or he is too groggy.  Pt denies falls.  Primary care records are reviewed.  Blood pressure medication was decreased since last visit because of orthostasis.  This helped.   No hallucinations but is dreaming a lot at night.  Mood has been  "off" per wife and patient admits to intermittent depression.  He was [facility] but had to stop.    Current prescribed movement disorder medications: carbidopa/levodopa 25/100 CR, 2/1/1 Clonazepam 0.5 mg, half tablet at bedtime carbidopa/levodopa 50/200 at bedtime   PREVIOUS MEDICATIONS: Carbidopa/levodopa 25/100 IR  ALLERGIES:   Allergies  Allergen Reactions  . Nsaids   . Sulfa Antibiotics Other (See Comments)    Had a reaction as a child  . Tolmetin Other (See Comments)    CURRENT MEDICATIONS:  Outpatient Encounter Medications as of 01/27/2020  Medication Sig  . acetaminophen (TYLENOL) 500 MG tablet Take 1,000 mg by mouth every 6 (six) hours as needed (pain.).   Marland Kitchen amLODipine (NORVASC) 2.5 MG tablet Take 0.5-1 tablets (1.25-2.5 mg total) by mouth daily.  Marland Kitchen aspirin EC 81 MG tablet Take 81 mg by mouth at bedtime.  Marland Kitchen atenolol (TENORMIN) 25 MG tablet TAKE 1 TABLET BY MOUTH EVERY EVENING  . atorvastatin (LIPITOR) 20 MG tablet TAKE 1 TABLET (20 MG TOTAL) BY MOUTH DAILY.  . brimonidine (ALPHAGAN) 0.2 % ophthalmic solution Place 1 drop into both eyes 2 (two) times daily.  . carbidopa-levodopa (SINEMET CR) 50-200 MG tablet TAKE 1 TABLET BY MOUTH AT BEDTIME.  . Carbidopa-Levodopa ER (SINEMET CR) 25-100 MG tablet controlled release TAKE 2 TABLETS BY MOUTH EVERY MORNING THEN TAKE 1 TABLET BY MOUTH IN THE AFTERNOON AND 1 TABLET EVERY EVENING  .  clonazePAM (KLONOPIN) 0.5 MG tablet TAKE 1 TABLET BY MOUTH EVERY NIGHT AT BEDTIME  . divalproex (DEPAKOTE ER) 250 MG 24 hr tablet TAKE 1 TABLET (250 MG TOTAL) BY MOUTH DAILY.  . Multiple Vitamins-Minerals (PRESERVISION AREDS 2 PO) Take 1 tablet by mouth daily.  Vladimir Faster Glycol-Propyl Glycol (LUBRICANT EYE DROPS) 0.4-0.3 % SOLN Place 1 drop into both eyes 3 (three) times daily as needed (dry/irritated eyes.).  Marland Kitchen sodium chloride (OCEAN) 0.65 % SOLN nasal spray Place 1 spray into both nostrils as needed for congestion.   No  facility-administered encounter medications on file as of 01/27/2020.    Objective:   PHYSICAL EXAMINATION:    VITALS:   Vitals:   01/27/20 1123  BP: (!) 152/88  Pulse: 66  SpO2: 98%  Weight: 231 lb (104.8 kg)  Height: 6\' 3"  (1.905 m)    GEN:  The patient appears stated age and is in NAD. HEENT:  Normocephalic, atraumatic.  The mucous membranes are moist. The superficial temporal arteries are without ropiness or tenderness. CV:  RRR Lungs:  CTAB Neck/HEME:  There are no carotid bruits bilaterally.  Neurological examination:  Orientation: The patient is alert and oriented x3. Cranial nerves: There is good facial symmetry with facial hypomimia. The speech is fluent and clear. Soft palate rises symmetrically and there is no tongue deviation. Hearing is intact to conversational tone. Sensation: Sensation is intact to light touch throughout Motor: Strength is at least antigravity x4.  Movement examination: Tone: There is nl tone in the UE/LE Abnormal movements: there is near constant RUE rest tremor and more rare LUE rest tremor Coordination:  There is  decremation with RAM's, mostly with hand opening and closing on the right. Gait and Station: The patient has min difficulty arising out of a deep-seated chair without the use of the hands. The patient's stride length is slightly decreased.    I have reviewed and interpreted the following labs independently    Chemistry      Component Value Date/Time   NA 139 04/06/2019 1144   K 4.6 04/06/2019 1144   CL 103 04/06/2019 1144   CO2 26 04/06/2019 1144   BUN 20 04/06/2019 1144   CREATININE 1.42 04/06/2019 1144      Component Value Date/Time   CALCIUM 9.6 04/06/2019 1144   ALKPHOS 85 04/06/2019 1144   AST 18 04/06/2019 1144   ALT 12 04/06/2019 1144   BILITOT 0.7 04/06/2019 1144       Lab Results  Component Value Date   WBC 9.2 08/28/2019   HGB 14.0 08/28/2019   HCT 41.8 08/28/2019   MCV 92.1 08/28/2019   PLT 190.0  08/28/2019    Lab Results  Component Value Date   TSH 2.45 04/06/2019     Total time spent on today's visit was 30 minutes, including both face-to-face time and nonface-to-face time.  Time included that spent on review of records (prior notes available to me/labs/imaging if pertinent), discussing treatment and goals, answering patient's questions and coordinating care.  Cc:  Mosie Lukes, MD

## 2020-01-27 ENCOUNTER — Encounter: Payer: Self-pay | Admitting: Family Medicine

## 2020-01-27 ENCOUNTER — Other Ambulatory Visit: Payer: Self-pay | Admitting: Neurology

## 2020-01-27 ENCOUNTER — Ambulatory Visit (INDEPENDENT_AMBULATORY_CARE_PROVIDER_SITE_OTHER): Payer: Medicare Other | Admitting: Neurology

## 2020-01-27 ENCOUNTER — Other Ambulatory Visit: Payer: Self-pay

## 2020-01-27 ENCOUNTER — Encounter: Payer: Self-pay | Admitting: Neurology

## 2020-01-27 VITALS — BP 152/88 | HR 66 | Ht 75.0 in | Wt 231.0 lb

## 2020-01-27 DIAGNOSIS — G20 Parkinson's disease: Secondary | ICD-10-CM | POA: Diagnosis not present

## 2020-01-27 DIAGNOSIS — G2581 Restless legs syndrome: Secondary | ICD-10-CM | POA: Diagnosis not present

## 2020-01-27 DIAGNOSIS — F32A Depression, unspecified: Secondary | ICD-10-CM

## 2020-01-27 MED ORDER — CARBIDOPA-LEVODOPA ER 25-100 MG PO TBCR: EXTENDED_RELEASE_TABLET | ORAL | 1 refills | Status: DC

## 2020-01-27 MED ORDER — MIRTAZAPINE 15 MG PO TABS: 15.0000 mg | ORAL_TABLET | Freq: Every day | ORAL | 1 refills | Status: DC

## 2020-01-27 MED ORDER — CLONAZEPAM 0.5 MG PO TABS: 0.2500 mg | ORAL_TABLET | Freq: Every day | ORAL | 1 refills | Status: DC

## 2020-01-27 MED FILL — clonazePAM 0.5 MG TABS: 0.5 | 90 days supply | Qty: 45 | Fill #0

## 2020-01-27 MED FILL — MIRTAZAPINE 15 MG TABLET: 15 | 90 days supply | Qty: 90 | Fill #0

## 2020-01-27 NOTE — Patient Instructions (Addendum)
1.  Increase carbidopa/levodopa 25/100 CR, 2 at 6am/2 at 10am/1 at 2pm/1 at 6pm.  Can take extra if needed in middle of night 2.  Continue carbidopa/levodopa 50/200 at bedtime 3.  IN ONE WEEK, start mirtazapine, 15 mg at bedtime 4.  Call me when you are ready to start [facility].  The physicians and staff at [facility] are committed to providing excellent care. You may receive a survey requesting feedback about your experience at our office. We strive to receive "very good" responses to the survey questions. If you feel that your experience would prevent you from giving the office a "very good " response, please contact our office to try to remedy the situation. We may be reached at [phone]. Thank you for taking the time out of your busy day to complete the survey.

## 2020-01-28 ENCOUNTER — Ambulatory Visit (INDEPENDENT_AMBULATORY_CARE_PROVIDER_SITE_OTHER): Payer: Medicare Other | Admitting: *Deleted

## 2020-01-28 DIAGNOSIS — Z23 Encounter for immunization: Secondary | ICD-10-CM

## 2020-01-28 MED FILL — CARBIDOPA-LEVO ER 50-200 TA: 50-200 | 90 days supply | Qty: 90 | Fill #1

## 2020-01-28 NOTE — Progress Notes (Signed)
Patient here for high dose flu vaccine.  Vaccine given in left deltoid and patient tolerated well.  

## 2020-02-01 DIAGNOSIS — L821 Other seborrheic keratosis: Secondary | ICD-10-CM | POA: Diagnosis not present

## 2020-02-01 DIAGNOSIS — D225 Melanocytic nevi of trunk: Secondary | ICD-10-CM | POA: Diagnosis not present

## 2020-02-01 DIAGNOSIS — L820 Inflamed seborrheic keratosis: Secondary | ICD-10-CM | POA: Diagnosis not present

## 2020-02-01 DIAGNOSIS — L905 Scar conditions and fibrosis of skin: Secondary | ICD-10-CM | POA: Diagnosis not present

## 2020-02-01 DIAGNOSIS — L570 Actinic keratosis: Secondary | ICD-10-CM | POA: Diagnosis not present

## 2020-02-01 DIAGNOSIS — L578 Other skin changes due to chronic exposure to nonionizing radiation: Secondary | ICD-10-CM | POA: Diagnosis not present

## 2020-02-01 DIAGNOSIS — Z85828 Personal history of other malignant neoplasm of skin: Secondary | ICD-10-CM | POA: Diagnosis not present

## 2020-02-01 MED FILL — AMLODIPINE 2.5 MG TABLET: 2.5 | 30 days supply | Qty: 30 | Fill #3

## 2020-02-02 ENCOUNTER — Encounter: Payer: Self-pay | Admitting: Family Medicine

## 2020-02-02 DIAGNOSIS — H9319 Tinnitus, unspecified ear: Secondary | ICD-10-CM

## 2020-02-02 DIAGNOSIS — H9190 Unspecified hearing loss, unspecified ear: Secondary | ICD-10-CM

## 2020-02-03 DIAGNOSIS — H0100B Unspecified blepharitis left eye, upper and lower eyelids: Secondary | ICD-10-CM | POA: Diagnosis not present

## 2020-02-03 DIAGNOSIS — H0100A Unspecified blepharitis right eye, upper and lower eyelids: Secondary | ICD-10-CM | POA: Diagnosis not present

## 2020-02-03 DIAGNOSIS — H04123 Dry eye syndrome of bilateral lacrimal glands: Secondary | ICD-10-CM | POA: Diagnosis not present

## 2020-02-03 DIAGNOSIS — H401132 Primary open-angle glaucoma, bilateral, moderate stage: Secondary | ICD-10-CM | POA: Diagnosis not present

## 2020-02-03 NOTE — Telephone Encounter (Signed)
Are you ok with referral or would you like to see them?  If ok with referral what diagnosis you would like to use (tinnitis)?

## 2020-02-08 ENCOUNTER — Encounter: Payer: Self-pay | Admitting: Family Medicine

## 2020-02-19 DIAGNOSIS — H903 Sensorineural hearing loss, bilateral: Secondary | ICD-10-CM | POA: Diagnosis not present

## 2020-02-22 ENCOUNTER — Other Ambulatory Visit: Payer: Self-pay | Admitting: Neurology

## 2020-02-22 MED FILL — DIVALPROEX SOD ER 250 MG TA: 250 | 90 days supply | Qty: 90 | Fill #0

## 2020-02-22 MED FILL — CARBIDOPA-LEVO ER 25-100 TA: 25-100 | 90 days supply | Qty: 540 | Fill #0

## 2020-02-29 ENCOUNTER — Other Ambulatory Visit: Payer: Self-pay | Admitting: Family Medicine

## 2020-03-01 ENCOUNTER — Other Ambulatory Visit: Payer: Self-pay | Admitting: Family Medicine

## 2020-03-01 MED FILL — ATENOLOL 25 MG TABS: 25 | 90 days supply | Qty: 90 | Fill #0

## 2020-03-07 ENCOUNTER — Other Ambulatory Visit: Payer: Self-pay | Admitting: Family Medicine

## 2020-03-07 DIAGNOSIS — H903 Sensorineural hearing loss, bilateral: Secondary | ICD-10-CM | POA: Diagnosis not present

## 2020-03-07 MED FILL — AMLODIPINE 2.5 MG TABLET: 2.5 | 30 days supply | Qty: 30 | Fill #0

## 2020-03-14 DIAGNOSIS — L578 Other skin changes due to chronic exposure to nonionizing radiation: Secondary | ICD-10-CM | POA: Diagnosis not present

## 2020-03-14 DIAGNOSIS — L570 Actinic keratosis: Secondary | ICD-10-CM | POA: Diagnosis not present

## 2020-03-28 ENCOUNTER — Other Ambulatory Visit: Payer: Self-pay | Admitting: Family Medicine

## 2020-03-28 MED FILL — ATORVASTATIN CALCIUM 20 MG: 20 | 90 days supply | Qty: 90 | Fill #0

## 2020-04-04 MED FILL — AMLODIPINE 2.5 MG TABLET: 2.5 | 30 days supply | Qty: 30 | Fill #1

## 2020-04-25 ENCOUNTER — Other Ambulatory Visit: Payer: Self-pay | Admitting: Neurology

## 2020-04-25 ENCOUNTER — Other Ambulatory Visit: Payer: Self-pay

## 2020-04-25 MED ORDER — CARBIDOPA-LEVODOPA ER 50-200 MG PO TBCR
1.0000 | EXTENDED_RELEASE_TABLET | Freq: Every day | ORAL | 1 refills | Status: DC
Start: 2020-04-25 — End: 2020-04-25

## 2020-04-25 MED ORDER — CARBIDOPA-LEVODOPA ER 50-200 MG PO TBCR
1.0000 | EXTENDED_RELEASE_TABLET | Freq: Every day | ORAL | 0 refills | Status: DC
Start: 2020-04-25 — End: 2020-04-25

## 2020-04-25 MED FILL — clonazePAM 0.5 MG TABS: 0.5 | 90 days supply | Qty: 45 | Fill #1

## 2020-04-25 MED FILL — MIRTAZAPINE 15 MG TABLET: 15 | 90 days supply | Qty: 90 | Fill #1

## 2020-04-25 MED FILL — CARBIDOPA-LEVO ER 50-200 TA: 50-200 | 90 days supply | Qty: 90 | Fill #0

## 2020-04-25 NOTE — Telephone Encounter (Signed)
Contacted pharmacy to phone-in rx.   Rx verbal given.

## 2020-05-02 MED FILL — AMLODIPINE 2.5 MG TABLET: 2.5 | 30 days supply | Qty: 30 | Fill #2

## 2020-05-16 MED FILL — DIVALPROEX SOD ER 250 MG TA: 250 | 90 days supply | Qty: 90 | Fill #1

## 2020-05-30 ENCOUNTER — Other Ambulatory Visit: Payer: Self-pay | Admitting: Family Medicine

## 2020-05-30 MED FILL — AMLODIPINE 2.5 MG TABLET: 2.5 | 30 days supply | Qty: 30 | Fill #3

## 2020-05-30 MED FILL — ATENOLOL 25 MG TABLET: 25 | 90 days supply | Qty: 90 | Fill #0

## 2020-05-30 MED FILL — CARBIDOPA-LEVO ER 25-100 TA: 25-100 | 90 days supply | Qty: 540 | Fill #1

## 2020-06-28 MED FILL — ATORVASTATIN CALCIUM 20 MG: 20 | 90 days supply | Qty: 90 | Fill #1

## 2020-07-01 DIAGNOSIS — H401132 Primary open-angle glaucoma, bilateral, moderate stage: Secondary | ICD-10-CM | POA: Diagnosis not present

## 2020-07-01 DIAGNOSIS — H0100A Unspecified blepharitis right eye, upper and lower eyelids: Secondary | ICD-10-CM | POA: Diagnosis not present

## 2020-07-01 DIAGNOSIS — H353131 Nonexudative age-related macular degeneration, bilateral, early dry stage: Secondary | ICD-10-CM | POA: Diagnosis not present

## 2020-07-01 DIAGNOSIS — H52203 Unspecified astigmatism, bilateral: Secondary | ICD-10-CM | POA: Diagnosis not present

## 2020-07-01 DIAGNOSIS — H524 Presbyopia: Secondary | ICD-10-CM | POA: Diagnosis not present

## 2020-07-01 DIAGNOSIS — H35363 Drusen (degenerative) of macula, bilateral: Secondary | ICD-10-CM | POA: Diagnosis not present

## 2020-07-01 DIAGNOSIS — H0100B Unspecified blepharitis left eye, upper and lower eyelids: Secondary | ICD-10-CM | POA: Diagnosis not present

## 2020-07-01 DIAGNOSIS — D3131 Benign neoplasm of right choroid: Secondary | ICD-10-CM | POA: Diagnosis not present

## 2020-07-01 DIAGNOSIS — Z83511 Family history of glaucoma: Secondary | ICD-10-CM | POA: Diagnosis not present

## 2020-07-01 DIAGNOSIS — Z961 Presence of intraocular lens: Secondary | ICD-10-CM | POA: Diagnosis not present

## 2020-07-01 DIAGNOSIS — H43812 Vitreous degeneration, left eye: Secondary | ICD-10-CM | POA: Diagnosis not present

## 2020-07-01 DIAGNOSIS — H04123 Dry eye syndrome of bilateral lacrimal glands: Secondary | ICD-10-CM | POA: Diagnosis not present

## 2020-07-04 ENCOUNTER — Other Ambulatory Visit: Payer: Self-pay | Admitting: Family Medicine

## 2020-07-04 ENCOUNTER — Other Ambulatory Visit (HOSPITAL_BASED_OUTPATIENT_CLINIC_OR_DEPARTMENT_OTHER): Payer: Self-pay

## 2020-07-04 DIAGNOSIS — N1831 Chronic kidney disease, stage 3a: Secondary | ICD-10-CM | POA: Diagnosis not present

## 2020-07-04 DIAGNOSIS — C649 Malignant neoplasm of unspecified kidney, except renal pelvis: Secondary | ICD-10-CM | POA: Diagnosis not present

## 2020-07-04 DIAGNOSIS — C642 Malignant neoplasm of left kidney, except renal pelvis: Secondary | ICD-10-CM | POA: Diagnosis not present

## 2020-07-04 DIAGNOSIS — C641 Malignant neoplasm of right kidney, except renal pelvis: Secondary | ICD-10-CM | POA: Diagnosis not present

## 2020-07-04 MED ORDER — AMLODIPINE BESYLATE 2.5 MG PO TABS
1.2500 mg | ORAL_TABLET | Freq: Every day | ORAL | 3 refills | Status: DC
  Filled 2020-07-04: qty 30, 30d supply, fill #0
  Filled 2020-07-31: qty 30, 30d supply, fill #1
  Filled 2020-12-18: qty 30, 30d supply, fill #2
  Filled 2021-02-26: qty 30, 30d supply, fill #3

## 2020-07-12 DIAGNOSIS — C44529 Squamous cell carcinoma of skin of other part of trunk: Secondary | ICD-10-CM | POA: Diagnosis not present

## 2020-07-12 DIAGNOSIS — D485 Neoplasm of uncertain behavior of skin: Secondary | ICD-10-CM | POA: Diagnosis not present

## 2020-07-12 DIAGNOSIS — L578 Other skin changes due to chronic exposure to nonionizing radiation: Secondary | ICD-10-CM | POA: Diagnosis not present

## 2020-07-12 DIAGNOSIS — L820 Inflamed seborrheic keratosis: Secondary | ICD-10-CM | POA: Diagnosis not present

## 2020-07-12 DIAGNOSIS — L570 Actinic keratosis: Secondary | ICD-10-CM | POA: Diagnosis not present

## 2020-07-19 ENCOUNTER — Other Ambulatory Visit: Payer: Self-pay | Admitting: Neurology

## 2020-07-21 ENCOUNTER — Other Ambulatory Visit (HOSPITAL_BASED_OUTPATIENT_CLINIC_OR_DEPARTMENT_OTHER): Payer: Self-pay

## 2020-07-21 ENCOUNTER — Other Ambulatory Visit: Payer: Self-pay | Admitting: Neurology

## 2020-07-21 MED ORDER — CLONAZEPAM 0.5 MG PO TABS
ORAL_TABLET | ORAL | 0 refills | Status: DC
  Filled 2020-07-21: qty 45, 90d supply, fill #0

## 2020-07-26 NOTE — Progress Notes (Signed)
Assessment/Plan:   1.  Parkinsons Disease  -continue carbidopa/levodopa 25/100 CR, 2 at 6am/2 at 10am/1 at 2pm/1 at 6pm.    -Continue carbidopa/levodopa 50/200 at bedtime  -We discussed that it used to be thought that levodopa would increase risk of melanoma but now it is believed that Parkinsons itself likely increases risk of melanoma. he is to get regular skin checks.  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  RBD/RLS/insomnia  -On clonazepam 0.5 mg, half tablet at bedtime.  Tried to stop this medication and things got much worse, especially mood change and daytime hypersomnolence did not get better.  -better with mirtazapine, 15 mg q hs.  R/B/SE were discussed.  The opportunity to ask questions was given and they were answered to the best of my ability.  The patient expressed understanding and willingness to follow the outlined treatment protocols. 3.  Anxiety/depression  -Being followed by primary care.  On Depakote ER, 250 mg daily.  Did offer referral to psychiatry. 4.  Daytime hypersomnolence  -Nocturnal polysomnogram was negative, but he was awake most of the night so not sure that it was accurate 5.  Memory change  -discussed driving eval and he declined so it was recommend that he not drive at all and he agreed.  He asked about that again today and ultimately decided to pursue neurocog testing. I think that he will do okay on it.   Subjective:   Vincent Walker was seen today in follow up for Parkinsons disease.  My previous records were reviewed prior to todays visit as well as outside records available to me. Pt with wife who supplements hx. discussed last visit trying to take his medication on time as well as the fact that we increased his medication.  He reports today that he is doing okay with that.  He wonders if it causes EDS.  Pt denies falls.  Pt denies lightheadedness, near syncope.  No hallucinations.  We started him  on mirtazapine last visit to see if that would help with his sleep issues, along with the clonazepam.  He states that this is not causing hang over effect (not the cause of EDS) and he is sleeping better.  he is talking more in the sleep.  Doing biking and walking for exercising.  He has also been gardening.   Current prescribed movement disorder medications: carbidopa/levodopa 25/100 CR, 2 at 6am/2 at 10am/1 at 2pm/1 at 6pm (an increase) Clonazepam 0.5 mg, half tablet at bedtime carbidopa/levodopa 50/200 at bedtime Mirtazapine, 15 mg nightly  PREVIOUS MEDICATIONS: Carbidopa/levodopa 25/100 IR  ALLERGIES:   Allergies  Allergen Reactions  . Nsaids   . Sulfa Antibiotics Other (See Comments)    Had a reaction as a child  . Tolmetin Other (See Comments)    CURRENT MEDICATIONS:  Outpatient Encounter Medications as of 07/28/2020  Medication Sig  . acetaminophen (TYLENOL) 500 MG tablet Take 1,000 mg by mouth every 6 (six) hours as needed (pain.).   Marland Kitchen amLODipine (NORVASC) 2.5 MG tablet Take 1/2 - 1 tablets (1.25-2.5 mg total) by mouth daily.  Marland Kitchen aspirin EC 81 MG tablet Take 81 mg by mouth at bedtime.  Marland Kitchen atenolol (TENORMIN) 25 MG tablet TAKE 1 TABLET BY MOUTH EVERY EVENING  . atorvastatin (LIPITOR) 20 MG tablet TAKE 1 TABLET BY MOUTH ONCE DAILY  . brimonidine (ALPHAGAN) 0.2 % ophthalmic solution Place 1 drop into both eyes 2 (  two) times daily.  . carbidopa-levodopa (SINEMET CR) 50-200 MG tablet TAKE 1 TABLET BY MOUTH AT BEDTIME.  . Carbidopa-Levodopa ER (SINEMET CR) 25-100 MG tablet controlled release TAKE 2 TABLETS BY MOUTH AT 6AM, 2 TABS AT 10AM, 1 TAB AT 2PM AND TAKE 1 TABLET AT 6PM DAILY  . clonazePAM (KLONOPIN) 0.5 MG tablet TAKE 1/2 TABLET (0.25 MG TOTAL) BY MOUTH AT BEDTIME.  . divalproex (DEPAKOTE ER) 250 MG 24 hr tablet TAKE 1 TABLET (250 MG TOTAL) BY MOUTH DAILY.  . mirtazapine (REMERON) 15 MG tablet TAKE 1 TABLET (15 MG TOTAL) BY MOUTH AT BEDTIME.  . Multiple Vitamins-Minerals  (PRESERVISION AREDS 2 PO) Take 1 tablet by mouth daily.  Vladimir Faster Glycol-Propyl Glycol (LUBRICANT EYE DROPS) 0.4-0.3 % SOLN Place 1 drop into both eyes 3 (three) times daily as needed (dry/irritated eyes.).  Marland Kitchen sodium chloride (OCEAN) 0.65 % SOLN nasal spray Place 1 spray into both nostrils as needed for congestion.   No facility-administered encounter medications on file as of 07/28/2020.    Objective:   PHYSICAL EXAMINATION:    VITALS:   Vitals:   07/28/20 1042  BP: 128/82  Pulse: 67  SpO2: 97%  Weight: 240 lb (108.9 kg)  Height: _0  (1.905 m)    GEN:  The patient appears stated age and is in NAD. HEENT:  Normocephalic, atraumatic.  The mucous membranes are moist. The superficial temporal arteries are without ropiness or tenderness. CV:  RRR Lungs:  CTAB Neck/HEME:  There are no carotid bruits bilaterally.  Neurological examination:  Orientation: The patient is alert and oriented x3. Cranial nerves: There is good facial symmetry with facial hypomimia. The speech is fluent and clear. Soft palate rises symmetrically and there is no tongue deviation. Hearing is intact to conversational tone. Sensation: Sensation is intact to light touch throughout Motor: Strength is at least antigravity x4.  Movement examination: Tone: There is nl tone in the UE/LE Abnormal movements: there is bilateral UE rest tremor Coordination:  There is no decremation, with any form of RAMS, including alternating supination and pronation of the forearm, hand opening and closing, finger taps, heel taps and toe taps. Gait and Station: The patient has min difficulty arising out of a deep-seated chair without the use of the hands. The patient's stride length is slightly decreased and he drags the right leg a bit  I have reviewed and interpreted the following labs independently    Chemistry      Component Value Date/Time   NA 139 04/06/2019 1144   K 4.6 04/06/2019 1144   CL 103 04/06/2019 1144   CO2  26 04/06/2019 1144   BUN 20 04/06/2019 1144   CREATININE 1.42 04/06/2019 1144      Component Value Date/Time   CALCIUM 9.6 04/06/2019 1144   ALKPHOS 85 04/06/2019 1144   AST 18 04/06/2019 1144   ALT 12 04/06/2019 1144   BILITOT 0.7 04/06/2019 1144       Lab Results  Component Value Date   WBC 9.2 08/28/2019   HGB 14.0 08/28/2019   HCT 41.8 08/28/2019   MCV 92.1 08/28/2019   PLT 190.0 08/28/2019    Lab Results  Component Value Date   TSH 2.45 04/06/2019     Total time spent on today's visit was 30 minutes, including both face-to-face time and nonface-to-face time.  Time included that spent on review of records (prior notes available to me/labs/imaging if pertinent), discussing treatment and goals, answering patient's questions and coordinating care.  Cc:  Mosie Lukes, MD

## 2020-07-27 ENCOUNTER — Other Ambulatory Visit (HOSPITAL_BASED_OUTPATIENT_CLINIC_OR_DEPARTMENT_OTHER): Payer: Self-pay

## 2020-07-27 MED FILL — Carbidopa & Levodopa Tab ER 50-200 MG: ORAL | 90 days supply | Qty: 90 | Fill #0 | Status: AC

## 2020-07-28 ENCOUNTER — Other Ambulatory Visit: Payer: Self-pay

## 2020-07-28 ENCOUNTER — Encounter: Payer: Self-pay | Admitting: Neurology

## 2020-07-28 ENCOUNTER — Ambulatory Visit (INDEPENDENT_AMBULATORY_CARE_PROVIDER_SITE_OTHER): Payer: Medicare Other | Admitting: Neurology

## 2020-07-28 VITALS — BP 128/82 | HR 67 | Ht 75.0 in | Wt 240.0 lb

## 2020-07-28 DIAGNOSIS — G20 Parkinson's disease: Secondary | ICD-10-CM | POA: Diagnosis not present

## 2020-07-28 DIAGNOSIS — R413 Other amnesia: Secondary | ICD-10-CM

## 2020-07-28 NOTE — Patient Instructions (Signed)
You have been referred for a neurocognitive evaluation (i.e., evaluation of memory and thinking abilities). Please bring someone with you to this appointment if possible, as it is helpful for the neuropsychologist to hear from both you and another adult who knows you well. Please bring eyeglasses and hearing aids if you wear them.    The evaluation will take approximately 3 hours and has two parts:   . The first part is a clinical interview with the neuropsychologist, Dr. Merz or Dr. Stewart. During the interview, the neuropsychologist will speak with you and the individual you brought to the appointment.    . The second part of the evaluation is testing with the doctor's technician, aka psychometrician, Dana or Kim. During the testing, the technician will ask you to remember different types of material, solve problems, and answer some questionnaires. Your family member will not be present for this portion of the evaluation.   Please note: We have to reserve several hours of the neuropsychologist's time and the psychometrician's time for your evaluation appointment. As such, there is a No-Show fee of $100. If you are unable to attend any of your appointments, please contact our office as soon as possible to reschedule.   

## 2020-07-29 DIAGNOSIS — C44529 Squamous cell carcinoma of skin of other part of trunk: Secondary | ICD-10-CM | POA: Diagnosis not present

## 2020-07-31 ENCOUNTER — Other Ambulatory Visit: Payer: Self-pay | Admitting: Neurology

## 2020-08-01 ENCOUNTER — Other Ambulatory Visit (HOSPITAL_BASED_OUTPATIENT_CLINIC_OR_DEPARTMENT_OTHER): Payer: Self-pay

## 2020-08-01 MED ORDER — MIRTAZAPINE 15 MG PO TABS
ORAL_TABLET | Freq: Every day | ORAL | 0 refills | Status: DC
  Filled 2020-08-01: qty 90, 90d supply, fill #0

## 2020-08-19 ENCOUNTER — Other Ambulatory Visit (HOSPITAL_BASED_OUTPATIENT_CLINIC_OR_DEPARTMENT_OTHER): Payer: Self-pay

## 2020-08-19 ENCOUNTER — Other Ambulatory Visit: Payer: Self-pay | Admitting: Neurology

## 2020-08-19 MED ORDER — DIVALPROEX SODIUM ER 250 MG PO TB24
ORAL_TABLET | Freq: Every day | ORAL | 1 refills | Status: DC
  Filled 2020-08-19: qty 90, 90d supply, fill #0
  Filled 2020-11-13: qty 90, 90d supply, fill #1

## 2020-08-22 ENCOUNTER — Other Ambulatory Visit: Payer: Self-pay | Admitting: Family Medicine

## 2020-08-22 ENCOUNTER — Other Ambulatory Visit (HOSPITAL_BASED_OUTPATIENT_CLINIC_OR_DEPARTMENT_OTHER): Payer: Self-pay

## 2020-08-22 MED ORDER — ATENOLOL 25 MG PO TABS
ORAL_TABLET | Freq: Every evening | ORAL | 0 refills | Status: DC
  Filled 2020-08-22: qty 90, 90d supply, fill #0

## 2020-08-22 NOTE — Telephone Encounter (Signed)
Patient is requesting a refill of the following medications: Requested Prescriptions   Pending Prescriptions Disp Refills   atenolol (TENORMIN) 25 MG tablet 90 tablet 0    Sig: TAKE 1 TABLET BY MOUTH EVERY EVENING    Date of patient request: 08/22/20 Last office visit: 11/02/19 video visit Date of last refill: 05/30/20 Last refill amount: 90 +0 Follow up time period per chart: none scheduled

## 2020-09-04 ENCOUNTER — Other Ambulatory Visit: Payer: Self-pay | Admitting: Neurology

## 2020-09-05 ENCOUNTER — Other Ambulatory Visit (HOSPITAL_BASED_OUTPATIENT_CLINIC_OR_DEPARTMENT_OTHER): Payer: Self-pay

## 2020-09-05 MED ORDER — CARBIDOPA-LEVODOPA ER 25-100 MG PO TBCR
EXTENDED_RELEASE_TABLET | ORAL | 0 refills | Status: DC
  Filled 2020-09-05: qty 540, 90d supply, fill #0

## 2020-09-06 ENCOUNTER — Other Ambulatory Visit (HOSPITAL_BASED_OUTPATIENT_CLINIC_OR_DEPARTMENT_OTHER): Payer: Self-pay

## 2020-09-27 ENCOUNTER — Other Ambulatory Visit (HOSPITAL_BASED_OUTPATIENT_CLINIC_OR_DEPARTMENT_OTHER): Payer: Self-pay

## 2020-09-27 ENCOUNTER — Other Ambulatory Visit: Payer: Self-pay | Admitting: Family Medicine

## 2020-09-27 MED ORDER — ATORVASTATIN CALCIUM 20 MG PO TABS
20.0000 mg | ORAL_TABLET | Freq: Every day | ORAL | 0 refills | Status: DC
  Filled 2020-09-27: qty 90, 90d supply, fill #0

## 2020-10-16 ENCOUNTER — Other Ambulatory Visit: Payer: Self-pay | Admitting: Neurology

## 2020-10-17 ENCOUNTER — Other Ambulatory Visit (HOSPITAL_BASED_OUTPATIENT_CLINIC_OR_DEPARTMENT_OTHER): Payer: Self-pay

## 2020-10-17 DIAGNOSIS — L218 Other seborrheic dermatitis: Secondary | ICD-10-CM | POA: Diagnosis not present

## 2020-10-17 DIAGNOSIS — L905 Scar conditions and fibrosis of skin: Secondary | ICD-10-CM | POA: Diagnosis not present

## 2020-10-17 DIAGNOSIS — Z85828 Personal history of other malignant neoplasm of skin: Secondary | ICD-10-CM | POA: Diagnosis not present

## 2020-10-17 DIAGNOSIS — L570 Actinic keratosis: Secondary | ICD-10-CM | POA: Diagnosis not present

## 2020-10-17 DIAGNOSIS — L821 Other seborrheic keratosis: Secondary | ICD-10-CM | POA: Diagnosis not present

## 2020-10-17 DIAGNOSIS — L814 Other melanin hyperpigmentation: Secondary | ICD-10-CM | POA: Diagnosis not present

## 2020-10-17 DIAGNOSIS — D225 Melanocytic nevi of trunk: Secondary | ICD-10-CM | POA: Diagnosis not present

## 2020-10-17 MED ORDER — FLUTICASONE PROPIONATE 0.05 % EX CREA
TOPICAL_CREAM | CUTANEOUS | 1 refills | Status: DC
  Filled 2020-10-17: qty 30, 15d supply, fill #0

## 2020-10-17 MED ORDER — CLONAZEPAM 0.5 MG PO TABS
ORAL_TABLET | ORAL | 0 refills | Status: DC
  Filled 2020-10-17: qty 45, 90d supply, fill #0

## 2020-10-21 ENCOUNTER — Encounter: Payer: Self-pay | Admitting: Psychology

## 2020-10-21 ENCOUNTER — Ambulatory Visit (INDEPENDENT_AMBULATORY_CARE_PROVIDER_SITE_OTHER): Payer: Medicare Other | Admitting: Psychology

## 2020-10-21 ENCOUNTER — Other Ambulatory Visit: Payer: Self-pay

## 2020-10-21 ENCOUNTER — Ambulatory Visit: Payer: Medicare Other | Admitting: Psychology

## 2020-10-21 DIAGNOSIS — R4189 Other symptoms and signs involving cognitive functions and awareness: Secondary | ICD-10-CM

## 2020-10-21 DIAGNOSIS — G3184 Mild cognitive impairment, so stated: Secondary | ICD-10-CM | POA: Diagnosis not present

## 2020-10-21 DIAGNOSIS — G20 Parkinson's disease: Secondary | ICD-10-CM | POA: Diagnosis not present

## 2020-10-21 DIAGNOSIS — F0670 Mild neurocognitive disorder due to known physiological condition without behavioral disturbance: Secondary | ICD-10-CM

## 2020-10-21 HISTORY — DX: Parkinson's disease: G20

## 2020-10-21 HISTORY — DX: Mild neurocognitive disorder due to known physiological condition without behavioral disturbance: F06.70

## 2020-10-21 NOTE — Progress Notes (Signed)
   Psychometrician Note   Cognitive testing was administered to Vincent Walker by Milana Kidney, B.S. (psychometrist) under the supervision of Dr. Christia Reading, Ph.D., licensed psychologist on 10/21/20. [name] did not appear overtly distressed by the testing session per behavioral observation or responses across self-report questionnaires. Rest breaks were offered.    The battery of tests administered was selected by Dr. Christia Reading, Ph.D. with consideration to [name] current level of functioning, the nature of his symptoms, emotional and behavioral responses during interview, level of literacy, observed level of motivation/effort, and the nature of the referral question. This battery was communicated to the psychometrist. Communication between Dr. Christia Reading, Ph.D. and the psychometrist was ongoing throughout the evaluation and Dr. Christia Reading, Ph.D. was immediately accessible at all times. Dr. Christia Reading, Ph.D. provided supervision to the psychometrist on the date of this service to the extent necessary to assure the quality of all services provided.    Vincent Walker will return within approximately 1-2 weeks for an interactive feedback session with Dr. Melvyn Novas at which time his test performances, clinical impressions, and treatment recommendations will be reviewed in detail. [name] understands he can contact our office should he require our assistance before this time.  A total of 145 minutes of billable time were spent face-to-face with [name] by the psychometrist. This includes both test administration and scoring time. Billing for these services is reflected in the clinical report generated by Dr. Christia Reading, Ph.D.  This note reflects time spent with the psychometrician and does not include test scores or any clinical interpretations made by Dr. Melvyn Novas. The full report will follow in a separate note.

## 2020-10-21 NOTE — Progress Notes (Signed)
NEUROPSYCHOLOGICAL EVALUATION [place]. [place] Department of Neurology  Date of Evaluation: October 21, 2020  Reason for Referral:   Vincent Walker is a 75 y.o. right-handed Caucasian male referred by Alonza Bogus, D.O., to characterize his current cognitive functioning and assist with diagnostic clarity and treatment planning in the context of subjective cognitive decline and history of Parkinson's disease.   Assessment and Plan:   Clinical Impression(s): Vincent Walker' pattern of performance is suggestive of an impairment surrounding processing speed, as well as performance variability across executive functioning. Performance was largely appropriate across domains of attention/concentration, receptive and expressive language, visuospatial abilities, and learning and memory. Vincent Walker denied difficulties completing instrumental activities of daily living (ADLs) independently. As such, given evidence for cognitive dysfunction described above, he meets criteria for a Mild Neurocognitive Disorder ("mild cognitive impairment") at the present time.  His history of Parkinson's disease represents the most likely culprit for observed areas of difficulty on testing. Deficits in processing speed, executive functioning, and encoding (i.e, learning) and retrieval aspects of memory are commonly experienced in this condition. In addition to this, he reported moderate anxiety and mild depression on related mood questionnaires. It is likely that these mood factors are worsening deficits due to his neurological condition to a mild extent. Despite the report of some REM sleep behaviors, I do not see compelling evidence for Lewy body dementia. I also do not have concerns surrounding Alzheimer's disease or frontotemporal dementia at the present time. Continued medical monitoring will be important moving forward.  Recommendations: A repeat neuropsychological evaluation in 18-24 months (or sooner if  functional decline is noted) is recommended to assess the trajectory of future cognitive decline should it occur. This will also aid in future efforts towards improved diagnostic clarity.  A combination of medication and psychotherapy has been shown to be most effective at treating symptoms of anxiety and depression. As such, Vincent Walker is encouraged to speak with his prescribing physician regarding medication adjustments to optimally manage these symptoms. Vincent Walker could also consider engaging in short-term psychotherapy to address symptoms of psychiatric distress. He would benefit from an active and collaborative therapeutic environment, rather than one purely supportive in nature. Recommended treatment modalities include Cognitive Behavioral Therapy (CBT) or Acceptance and Commitment Therapy (ACT).  Performance across neurocognitive testing is not a strong predictor of an individual's safety operating a motor vehicle. Should he or his family wish to pursue a formalized driving evaluation, they would be encouraged to contact [facility] [employer] [facility] in [place], [place] at [phone]. Another option would be locally through [facility]; however, the latter would likely require a referral from a medical doctor. [facility] can be reached directly at [phone].   Vincent Walker is encouraged to attend to lifestyle factors for brain health (e.g., regular physical exercise, good nutrition habits, regular participation in cognitively-stimulating activities, and general stress management techniques), which are likely to have benefits for both emotional adjustment and cognition. In fact, in addition to promoting good general health, regular exercise incorporating aerobic activities (e.g., brisk walking, jogging, cycling, etc.) has been demonstrated to be a very effective treatment for depression and stress, with similar efficacy rates to both antidepressant medication and psychotherapy. Optimal control  of vascular risk factors (including safe cardiovascular exercise and adherence to dietary recommendations) is encouraged.   When learning new information, he would benefit from information being broken up into small, manageable pieces. He may also find it helpful to articulate the material  in his own words and in a context to promote encoding at the onset of a new task. This material may need to be repeated multiple times to promote encoding.  Memory can be improved using internal strategies such as rehearsal, repetition, chunking, mnemonics, association, and imagery. External strategies such as written notes in a consistently used memory journal, visual and nonverbal auditory cues such as a calendar on the refrigerator or appointments with alarm, such as on a cell phone, can also help maximize recall.    To address problems with processing speed, he may wish to consider:   -Scheduling more difficult activities for a time of day when he is usually most alert   -Ensuring that he is alerted when essential material or instructions are being presented   -Adjusting the speed at which new information is presented   -Allowing for more time in comprehending, processing, and responding in conversation  To address problems with fluctuating attention, he may wish to consider:   -Avoiding external distractions when needing to concentrate    -Limiting exposure to fast paced environments with multiple sensory demands   -Writing down complicated information and using checklists   -Attempting and completing one task at a time (i.e., no multi-tasking)   -Verbalizing aloud each step of a task to maintain focus   -Reducing the amount of information considered at one time  Review of Records:   Vincent Walker was most recently seen by [facility] Wells Guiles Tat, D.O.) on 07/28/2020 for follow-up of Parkinson's disease. Effectiveness of previous medication adjustments were discussed, with Vincent Walker noting that he was  doing better taking his medications on time lately. He denied any recent falls, lightheadedness, or near syncope. He also denied any visual hallucinations. He reported sleeping better with the assistance of medication adjustments. Records do suggest a history of REM sleep behaviors. While meeting with Dr. Carles Collet, he expressed some concerns surrounding short-term memory. He also expressed concerns surrounding driving and if driving restrictions were necessary. Per Dr. Doristine Devoid most recent note, they "discussed [a] driving eval[uation] and he declined so it was recommend[ed] that he not drive at all and he agreed." Ultimately, Vincent Walker was referred for a comprehensive neuropsychological evaluation to characterize his cognitive abilities and to assist with diagnostic clarity and treatment planning.   Brain MRI on 11/30/2015 was largely unremarkable. Mild periventricular and subcortical small vessel changes were noted but said to be age appropriate. No more recent neuroimaging was available for review.   Past Medical History:  Diagnosis Date   Adenomatous polyp of ascending colon 10/09/2018   Allergies 07/07/2018   Arthritis    Benign essential hypertension 02/08/2015   Chronic renal insufficiency 02/14/2015   Constipation 12/23/2015   Dyslipidemia 02/08/2015   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 Required nasal packing a couple of days ago.  Had a similar episode of epistaxis a little over a year ago.  At that visit, I could not see the exact bleeding spot. EXAMINATION after packing removal reveals several excoriated areas anteriorly but I was unable to see t   Glaucoma    History of blood transfusion 2010   After Kidney surgery   History of chicken pox    History of renal cell carcinoma 02/08/2015   diagnosed and removed in 2010 Right Monitored by Dr Tresa Endo of urology at [facility] Dr  Richarda Overlie, nephrology at [facility]   Hyperglycemia 12/23/2015  Hyperlipidemia    Increased thyroid stimulating hormone (TSH) level 06/07/2017   Ingrown left big toenail 02/14/2015   Left foot pain 02/14/2015   MVP (mitral valve prolapse) 05/14/2016   Parkinson's disease 12/23/2015   Right hip pain 12/12/2016   Thrombocytopenia 02/08/2015   Tremor of right hand 02/14/2015    Past Surgical History:  Procedure Laterality Date   APPENDECTOMY  1995   CATARACT EXTRACTION, BILATERAL     COLONOSCOPY     COLONOSCOPY WITH PROPOFOL N/A 12/17/2018   Procedure: COLONOSCOPY WITH PROPOFOL;  Surgeon: Irving Copas., MD;  Location: [facility] ENDOSCOPY;  Service: Gastroenterology;  Laterality: N/A;   ENDOSCOPIC MUCOSAL RESECTION N/A 12/17/2018   Procedure: ENDOSCOPIC MUCOSAL RESECTION;  Surgeon: Rush Landmark Telford Nab., MD;  Location: [place];  Service: Gastroenterology;  Laterality: N/A;   HEMOSTASIS CLIP PLACEMENT  12/17/2018   Procedure: HEMOSTASIS CLIP PLACEMENT;  Surgeon: Irving Copas., MD;  Location: [place];  Service: Gastroenterology;;   left knee scope  2003   POLYPECTOMY  12/17/2018   Procedure: POLYPECTOMY;  Surgeon: Irving Copas., MD;  Location: [place];  Service: Gastroenterology;;   right knee scope  1999   SUBMUCOSAL LIFTING INJECTION  12/17/2018   Procedure: SUBMUCOSAL LIFTING INJECTION;  Surgeon: Irving Copas., MD;  Location: [place];  Service: Gastroenterology;;   TOE SURGERY Left    metal 2nd toe- and top of foot- straighten bone   TONSILLECTOMY     TOTAL KNEE ARTHROPLASTY Left 07/06/2014   TOTAL NEPHRECTOMY Right     Current Outpatient Medications:    clonazePAM (KLONOPIN) 0.5 MG tablet, TAKE 1/2 TABLET (0.25 MG TOTAL) BY MOUTH AT BEDTIME., Disp: 45 tablet, Rfl: 0   acetaminophen (TYLENOL) 500 MG tablet, Take 1,000 mg by mouth every 6 (six) hours as needed (pain.). , Disp: , Rfl:    amLODipine (NORVASC) 2.5 MG tablet, Take 1/2 -  1 tablets (1.25-2.5 mg total) by mouth daily., Disp: 30 tablet, Rfl: 3   aspirin EC 81 MG tablet, Take 81 mg by mouth at bedtime., Disp: , Rfl:    atenolol (TENORMIN) 25 MG tablet, TAKE 1 TABLET BY MOUTH EVERY EVENING, Disp: 90 tablet, Rfl: 0   atorvastatin (LIPITOR) 20 MG tablet, Take 1 tablet (20 mg total) by mouth daily., Disp: 90 tablet, Rfl: 0   brimonidine (ALPHAGAN) 0.2 % ophthalmic solution, Place 1 drop into both eyes 2 (two) times daily., Disp: , Rfl:    carbidopa-levodopa (SINEMET CR) 50-200 MG tablet, TAKE 1 TABLET BY MOUTH AT BEDTIME., Disp: 90 tablet, Rfl: 1   Carbidopa-Levodopa ER (SINEMET CR) 25-100 MG tablet controlled release, Take 2 tablets by mouth at 6am, 2 tabets at 10am, 1 tablet at 2pm, and 1 tablet at 6pm daily, Disp: 540 tablet, Rfl: 0   divalproex (DEPAKOTE ER) 250 MG 24 hr tablet, TAKE 1 TABLET (250 MG TOTAL) BY MOUTH DAILY., Disp: 90 tablet, Rfl: 1   fluticasone (CUTIVATE) 0.05 % cream, Apply twice a day to affected areas on face for 1 week on 1 week off as needed for flares, Disp: 30 g, Rfl: 1   mirtazapine (REMERON) 15 MG tablet, TAKE 1 TABLET (15 MG TOTAL) BY MOUTH AT BEDTIME., Disp: 90 tablet, Rfl: 0   Multiple Vitamins-Minerals (PRESERVISION AREDS 2 PO), Take 1 tablet by mouth daily., Disp: , Rfl:    Polyethyl Glycol-Propyl Glycol (LUBRICANT EYE DROPS) 0.4-0.3 % SOLN, Place 1 drop into both eyes 3 (three) times daily as needed (dry/irritated eyes.)., Disp: , Rfl:  sodium chloride (OCEAN) 0.65 % SOLN nasal spray, Place 1 spray into both nostrils as needed for congestion., Disp: 88 mL, Rfl: 2  Clinical Interview:   The following information was obtained during a clinical interview with Vincent Walker and his wife prior to cognitive testing.  Cognitive Symptoms: Decreased short-term memory: Endorsed. He reported trouble recalling the details of conversations, as well as occasional difficulty recalling names. He reported feeling that difficulties had been present for the  past couple of years and largely stable over that time. His wife agreed but also felt that he had exhibited a slight decline. 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  Decreased long-term memory: Denied. Decreased attention/concentration: Endorsed. However, he noted that trouble with focus and sustained attention was largely interest dependant. If he has interest in a topic, he feels he is able to attend well. He denied trouble with increased distractibility.  Reduced processing speed: Denied. Difficulties with executive functions: Denied. Difficulties with emotion regulation: Denied. Difficulties with receptive language: Denied assuming he can hear the source of the sound adequately.  Difficulties with word finding: Endorsed "occasionally."  Decreased visuoperceptual ability: Denied.  Difficulties completing ADLs: Largely denied. Vincent Walker. Austen manages his medications independently. He does have his wife check on him but she denied commonly finding errors. She did state that he may have some trouble recreating his system when they are traveling. His wife manages finances and bill paying which is longstanding in nature. They both expressed some concerns surrounding driving, with his wife highlighting reaction speed specifically. However, Vincent Walker. Lybbert has been driving lately without noted issue. He acknowledged that he drives more slowly than before and is less prone to changing lanes unnecessarily. However, he has been driving around the [facility] and [place] areas for the past few months without noted difficulty.   Additional Medical History: History of traumatic brain injury/concussion: Denied. They did describe a remote gardening accident where a heavy piece of equipment struck his head. While he required stiches, he did not lose consciousness and was not formally diagnosed with a concussion. No other potential head injuries were  reported.  History of stroke: Denied. History of seizure activity: Denied. History of known exposure to toxins: Denied. Symptoms of chronic pain: Endorsed. He reported somewhat diffuse pain, with his knees and hips being highlighted as particularly painful areas. Experience of frequent headaches/migraines: Denied. When rare headache symptoms are present, his wife noted that these are due to acute blood pressure elevations.  Frequent instances of dizziness/vertigo: Denied. He did acknowledge "sometimes" feeling lightheaded, generally in the morning.   Sensory changes: He wears glasses with general effectiveness. He has had cataract surgery in the past and medical records suggest concerns for macular degeneration. He did report some trouble reading small print. He wears hearing aids. Other sensory changes/difficulties (e.g., taste or smell) were denied.  Balance/coordination difficulties: Largely denied. Ms. Escalon described his balance as "pretty good." He noting walking with a cane for assistance in emergency situations where he might fall over. He denied any recent falls. His most recent one was several months ago, due to him tripping over a garden planter which was on the ground. His wife did note that movement has slowed and that he generally performs various actions much more slowly. This was said to have progressively worsened.   Other motor difficulties: He reported a generally mild tremor predominantly in his right hand. Symptoms were said to infrequently  involve his left hand. Symptoms have been present for several years and have seemed stable over time. However, Vincent Walker. Marcin did report that levodopa medications do result in a noticeable improvement in these symptoms.   Sleep History: Estimated hours obtained each night: 8 hours.  Difficulties falling asleep: Denied. Difficulties staying asleep: Endorsed. He reported commonly waking 3-4 times each night to use the restroom.  Feels rested and  refreshed upon awakening: Denied. He reported generally awakening still feeling tired. His wife noted that he also sleeps a good amount during the day.   History of snoring: Denied. History of waking up gasping for air: Denied. Witnessed breath cessation while asleep: Denied.  History of vivid dreaming: Endorsed. Excessive movement while asleep: Denied. Instances of acting out his dreams: Endorsed. As stated above, Vincent Walker has a history of REM sleep behaviors. His wife stated that he will frequently talk or yell out in his sleep. This was said to have become far more common during the past 6-12 months. They described one instance where he fell out of bed due to dreaming that he was chasing someone and had to jump over a barrier. However, acting out dreams was not said to be a regular occurrence.   Psychiatric/Behavioral Health History: Depression: Vincent Walker described his current mood as "it runs up and down." His wife expressed her belief that he has had some mild symptoms of depression lately. He agreed with this, stating that down periods are generally due to him realizing that certain physical limitations prevent him from doing enjoyable activities. Both he and his wife denied to their knowledge any formal mental health diagnoses in the past. Current or remote suicidal ideation, intent, or plan was denied.  Anxiety: Denied. Mania: Denied. Trauma History: Denied. Visual/auditory hallucinations: Denied. Delusional thoughts: Denied.  Tobacco: Denied. Alcohol: He denied current alcohol consumption as well as a history of problematic alcohol abuse or dependence.  Recreational drugs: Denied.  Family History: Problem Relation Age of Onset   Alcohol abuse Father    Hyperlipidemia Father    Hypertension Father    Diabetes Father    Heart disease Father    Cancer Maternal Aunt        lung cancer   Cancer Paternal Uncle        bone cancer   Heart attack Mother    Stroke Mother         swelling in brain stem   Healthy Daughter    Healthy Son    Healthy Daughter    Colon cancer Neg Hx    Esophageal cancer Neg Hx    Stomach cancer Neg Hx    Inflammatory bowel disease Neg Hx    Liver disease Neg Hx    Pancreatic cancer Neg Hx    Rectal cancer Neg Hx    This information was confirmed by Vincent Walker.  Academic/Vocational History: Highest level of educational attainment: 16 years. He completed high school and earned a Dietitian in Forensic scientist from [employer]. He described himself as a below average (C) student in academic settings. No relative weaknesses were identified.  History of developmental delay: Denied. History of grade repetition: Denied. Enrollment in special education courses: Denied. History of LD/ADHD: Denied.  Employment: Retired. 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 He noted that his primary expertise surrounded cigarette filters rather than tobacco itself.   Evaluation Results:   Behavioral Observations: Vincent Walker.  Yannuzzi was accompanied by his wife, arrived to his appointment on time, and was appropriately dressed and groomed. He appeared alert and oriented. He ambulated slowly and carried a cane for additional support. He did not exhibit frank balance instability. A tremor was noted in his right hand, predominantly affecting his right thumb. His affect was relaxed and positive. Spontaneous speech was fluent and word finding difficulties were not observed during the clinical interview. Thought processes were coherent, organized, and normal in content. Insight into his cognitive difficulties appeared adequate. During testing, sustained attention was appropriate. Task engagement was adequate and he persisted when challenged. He had some trouble comprehending the latter stages of the D-KEFS Color Word task. However, this difficulty was not noticed across  other measures. Overall, Vincent Walker was cooperative with the clinical interview and subsequent testing procedures.   Adequacy of Effort: The validity of neuropsychological testing is limited by the extent to which the individual being tested may be assumed to have exerted adequate effort during testing. Vincent Walker expressed his intention to perform to the best of his abilities and exhibited adequate task engagement and persistence. Scores across stand-alone and embedded performance validity measures were within expectation. As such, the results of the current evaluation are believed to be a valid representation of Vincent Walker' current cognitive functioning.  Test Results: Vincent Walker was largely oriented at the time of the current evaluation. He was unable to state the current date. He also was unable to name the current clinic; however, he was able to name the major road which it resides on.   Intellectual abilities based upon educational and vocational attainment were estimated to be in the average range. Premorbid abilities were estimated to be within the average range based upon a single-word reading test.   Processing speed was mildly variable but overall exceptionally low to well below average. Basic attention was average. More complex attention (e.g., working memory) was below average to average. Executive functioning was variable, ranging from the exceptionally low to above average normative ranges. He also performed in the well above average across a task assessing safety and judgment.   Assessed receptive language abilities were above average. Likewise, Vincent Walker did not exhibit any difficulties comprehending task instructions and answered all questions asked of him appropriately. Assessed expressive language (e.g., verbal fluency and confrontation naming) was mildly variable but largely below average to above average.     Assessed visuospatial/visuoconstructional abilities were below average to  average. Points were lost on his drawing of a clock due to generally mild numerical spacing abnormalities, as well as incorrect hand placement.    Learning (i.e., encoding) of novel verbal information was below average to average. Spontaneous delayed recall (i.e., retrieval) of previously learned information was average. Retention rates were 88% across a story learning task, 67% across a list learning task, and 72% across a figure drawing task. Performance across recognition tasks was below average to average, suggesting evidence for information consolidation.   Results of emotional screening instruments suggested that recent symptoms of generalized anxiety were in the moderate range, while symptoms of depression were within the mild range. A screening instrument assessing recent sleep quality suggested the presence of minimal sleep dysfunction.  Tables of Scores:   Note: This summary of test scores accompanies the interpretive report and should not be considered in isolation without reference to the appropriate sections in the text. Descriptors are based on appropriate normative data and may be adjusted based on clinical judgment. Terms such as "Within  Normal Limits" and "Outside Normal Limits" are used when a more specific description of the test score cannot be determined.       Percentile - Normative Descriptor > 98 - Exceptionally High 91-97 - Well Above Average 75-90 - Above Average 25-74 - Average 9-24 - Below Average 2-8 - Well Below Average < 2 - Exceptionally Low       Validity:   DESCRIPTOR       Dot Counting Test: --- --- Within Normal Limits  RBANS Effort Index: --- --- Within Normal Limits  WAIS-IV Reliable Digit Span: --- --- Within Normal Limits       Orientation:      Raw Score Percentile   NAB Orientation, Form 1 26/29 --- ---       Cognitive Screening:      Raw Score Percentile   SLUMS: 22/30 --- ---       RBANS, Form A: Standard Score/ Scaled Score Percentile    Total Score 80 9 Below Average  Immediate Memory 85 16 Below Average    List Learning 6 9 Below Average    Story Memory 9 37 Average  Visuospatial/Constructional 96 39 Average    Figure Copy 10 50 Average    Line Orientation 16/20 26-50 Average  Language 82 12 Below Average    Picture Naming 10/10 51-75 Average    Semantic Fluency 3 1 Exceptionally Low  Attention 79 8 Well Below Average    Digit Span 10 50 Average    Coding 3 1 Exceptionally Low  Delayed Memory 84 14 Below Average    List Recall 4/10 26-50 Average    List Recognition 17/20 10-16 Below Average    Story Recall 8 25 Average    Story Recognition 9/12 16-26 Below Average    Figure Recall 10 50 Average    Figure Recognition 6/8 30-52 Average       Intellectual Functioning:      Standard Score Percentile   Test of Premorbid Functioning: 94 34 Average       Attention/Executive Function:     Oral Trail Making Test (OTMT): Raw Score (Z-Score) Percentile     Part A 7 secs.,  0 errors (0.12) 55 Average    Part B 58 secs.,  2 errors (-1.01) 16 Below Average       Symbol Digit Modalities Test (SDMT): Raw Score (Z-Score) Percentile     Oral 26 (-1.98) 2 Well Below Average        Scaled Score Percentile   WAIS-IV Digit Span: 9 37 Average    Forward 10 50 Average    Backward 6 9 Below Average    Sequencing 10 50 Average        Scaled Score Percentile   WAIS-IV Similarities: 12 75 Above Average       D-KEFS Color-Word Interference Test: Raw Score (Scaled Score) Percentile     Color Naming 45 secs. (5) 5 Well Below Average    Word Reading 36 secs. (5) 5 Well Below Average    Inhibition 180 secs. (1) <1 Exceptionally Low      Total Errors 9 errors (4) 2 Well Below Average    Inhibition/Switching 180 secs. (1) <1 Exceptionally Low      Total Errors 13 errors (1) <1 Exceptionally Low       D-KEFS Verbal Fluency Test: Raw Score (Scaled Score) Percentile     Letter Total Correct 31 (9) 37 Average    Category Total  Correct 27 (  8) 25 Average    Category Switching Total Correct 8 (5) 5 Well Below Average    Category Switching Accuracy 7 (6) 9 Below Average      Total Set Loss Errors 0 (13) 84 Above Average      Total Repetition Errors 1 (12) 75 Above Average       NAB Executive Functions Module, Form 1: T Score Percentile     Judgment 64 92 Well Above Average       Language:     Verbal Fluency Test: Raw Score (T Score) Percentile     Phonemic Fluency (FAS) 31 (42) 21 Below Average    Animal Fluency 15 (44) 27 Average        NAB Language Module, Form 1: T Score Percentile     Auditory Comprehension 57 75 Above Average    Naming 31/31 (59) 82 Above Average       Visuospatial/Visuoconstruction:      Raw Score Percentile   Clock Drawing: 7/10 --- Within Normal Limits        Scaled Score Percentile   WAIS-IV Visual Puzzles: 7 16 Below Average  WAIS-IV Picture Completion: 10 50 Average       Mood and Personality:      Raw Score Percentile   Geriatric Depression Scale: 19 --- Mild  Geriatric Anxiety Scale: 23 --- Moderate    Somatic 6 --- Mild    Cognitive 9 --- Severe    Affective 8 --- Moderate       Additional Questionnaires:      Raw Score Percentile   PROMIS Sleep Disturbance Questionnaire: 22 --- None to Slight   Informed Consent and Coding/Compliance:   The current evaluation represents a clinical evaluation for the purposes previously outlined by the referral source and is in no way reflective of a forensic evaluation.   Vincent Walker. June was provided with a verbal description of the nature and purpose of the present neuropsychological evaluation. Also reviewed were the foreseeable risks and/or discomforts and benefits of the procedure, limits of confidentiality, and mandatory reporting requirements of this provider. The patient was given the opportunity to ask questions and receive answers about the evaluation. Oral consent to participate was provided by the patient.   This evaluation was  conducted by Christia Reading, Ph.D., licensed clinical neuropsychologist. Vincent Walker. Bage completed a clinical interview with Dr. Melvyn Novas, billed as one unit [phone], and 145 minutes of cognitive testing and scoring, billed as one unit [phone] and four additional units [phone]. Psychometrist Milana Kidney, B.S., assisted Dr. Melvyn Novas with test administration and scoring procedures. As a separate and discrete service, Dr. Melvyn Novas spent a total of 120 minutes in interpretation and report writing billed as one unit [phone] and one unit [phone].

## 2020-10-25 ENCOUNTER — Other Ambulatory Visit: Payer: Self-pay | Admitting: Neurology

## 2020-10-25 ENCOUNTER — Other Ambulatory Visit (HOSPITAL_BASED_OUTPATIENT_CLINIC_OR_DEPARTMENT_OTHER): Payer: Self-pay

## 2020-10-25 MED ORDER — MIRTAZAPINE 15 MG PO TABS
ORAL_TABLET | Freq: Every day | ORAL | 0 refills | Status: DC
  Filled 2020-10-25: qty 90, 90d supply, fill #0

## 2020-10-25 MED ORDER — CARBIDOPA-LEVODOPA ER 50-200 MG PO TBCR
1.0000 | EXTENDED_RELEASE_TABLET | Freq: Every day | ORAL | 1 refills | Status: DC
  Filled 2020-10-25: qty 90, 90d supply, fill #0
  Filled 2021-01-23: qty 90, 90d supply, fill #1

## 2020-10-28 ENCOUNTER — Other Ambulatory Visit: Payer: Self-pay

## 2020-10-28 ENCOUNTER — Ambulatory Visit (INDEPENDENT_AMBULATORY_CARE_PROVIDER_SITE_OTHER): Payer: Medicare Other | Admitting: Psychology

## 2020-10-28 DIAGNOSIS — G20 Parkinson's disease: Secondary | ICD-10-CM | POA: Diagnosis not present

## 2020-10-28 DIAGNOSIS — F0670 Mild neurocognitive disorder due to known physiological condition without behavioral disturbance: Secondary | ICD-10-CM

## 2020-10-28 DIAGNOSIS — G3184 Mild cognitive impairment, so stated: Secondary | ICD-10-CM

## 2020-10-28 NOTE — Progress Notes (Signed)
   Neuropsychology Feedback Session Tillie Rung. [place] Department of Neurology  Reason for Referral:   FRANCES BLEE is a 75 y.o. right-handed Caucasian male referred by Alonza Bogus, D.O., to characterize his current cognitive functioning and assist with diagnostic clarity and treatment planning in the context of subjective cognitive decline and history of Parkinson's disease.  Feedback:   Mr. Schoonover completed a comprehensive neuropsychological evaluation on 10/21/2020. Please refer to that encounter for the full report and recommendations. Briefly, results suggested an impairment surrounding processing speed, as well as performance variability across executive functioning. His history of Parkinson's disease represents the most likely culprit for observed areas of difficulty on testing. Deficits in processing speed, executive functioning, and encoding (i.e, learning) and retrieval aspects of memory are commonly experienced in this condition. In addition to this, he reported moderate anxiety and mild depression on related mood questionnaires. It is likely that these mood factors are worsening deficits due to his neurological condition to a mild extent.  Mr. Masterman was accompanied by his wife during the current feedback session. Content of the current session focused on the results of his neuropsychological evaluation. Mr. Krupa and his wife were given the opportunity to ask questions and their questions were answered. They were encouraged to reach out should additional questions arise. A copy of his report was provided at the conclusion of the visit.      25 minutes were spent conducting the current feedback session with Mr. Lotti, billed as one unit [phone].

## 2020-11-14 ENCOUNTER — Other Ambulatory Visit (HOSPITAL_BASED_OUTPATIENT_CLINIC_OR_DEPARTMENT_OTHER): Payer: Self-pay

## 2020-11-23 ENCOUNTER — Other Ambulatory Visit: Payer: Self-pay | Admitting: Family Medicine

## 2020-11-24 ENCOUNTER — Other Ambulatory Visit (HOSPITAL_BASED_OUTPATIENT_CLINIC_OR_DEPARTMENT_OTHER): Payer: Self-pay

## 2020-11-24 MED ORDER — ATENOLOL 25 MG PO TABS
ORAL_TABLET | Freq: Every evening | ORAL | 0 refills | Status: DC
  Filled 2020-11-24: qty 30, 30d supply, fill #0

## 2020-12-01 ENCOUNTER — Encounter: Payer: Self-pay | Admitting: Family Medicine

## 2020-12-02 ENCOUNTER — Other Ambulatory Visit (HOSPITAL_BASED_OUTPATIENT_CLINIC_OR_DEPARTMENT_OTHER): Payer: Self-pay

## 2020-12-02 ENCOUNTER — Other Ambulatory Visit: Payer: Self-pay | Admitting: Family Medicine

## 2020-12-02 MED ORDER — AMOXICILLIN 500 MG PO CAPS
500.0000 mg | ORAL_CAPSULE | Freq: Three times a day (TID) | ORAL | 0 refills | Status: AC
  Filled 2020-12-02: qty 21, 7d supply, fill #0

## 2020-12-02 NOTE — Progress Notes (Unsigned)
amoxicillin 

## 2020-12-05 ENCOUNTER — Other Ambulatory Visit: Payer: Self-pay | Admitting: Neurology

## 2020-12-05 DIAGNOSIS — G20 Parkinson's disease: Secondary | ICD-10-CM

## 2020-12-06 ENCOUNTER — Other Ambulatory Visit: Payer: Self-pay

## 2020-12-06 ENCOUNTER — Other Ambulatory Visit (HOSPITAL_BASED_OUTPATIENT_CLINIC_OR_DEPARTMENT_OTHER): Payer: Self-pay

## 2020-12-06 MED ORDER — CARBIDOPA-LEVODOPA ER 25-100 MG PO TBCR
EXTENDED_RELEASE_TABLET | ORAL | 0 refills | Status: DC
  Filled 2020-12-06: qty 540, 90d supply, fill #0

## 2020-12-07 DIAGNOSIS — H0100A Unspecified blepharitis right eye, upper and lower eyelids: Secondary | ICD-10-CM | POA: Diagnosis not present

## 2020-12-07 DIAGNOSIS — H401132 Primary open-angle glaucoma, bilateral, moderate stage: Secondary | ICD-10-CM | POA: Diagnosis not present

## 2020-12-07 DIAGNOSIS — H04123 Dry eye syndrome of bilateral lacrimal glands: Secondary | ICD-10-CM | POA: Diagnosis not present

## 2020-12-07 DIAGNOSIS — H0100B Unspecified blepharitis left eye, upper and lower eyelids: Secondary | ICD-10-CM | POA: Diagnosis not present

## 2020-12-19 ENCOUNTER — Other Ambulatory Visit (HOSPITAL_BASED_OUTPATIENT_CLINIC_OR_DEPARTMENT_OTHER): Payer: Self-pay

## 2020-12-23 ENCOUNTER — Other Ambulatory Visit (HOSPITAL_BASED_OUTPATIENT_CLINIC_OR_DEPARTMENT_OTHER): Payer: Self-pay

## 2020-12-23 ENCOUNTER — Other Ambulatory Visit: Payer: Self-pay | Admitting: Family Medicine

## 2020-12-27 ENCOUNTER — Encounter: Payer: Self-pay | Admitting: Medical

## 2020-12-27 ENCOUNTER — Ambulatory Visit: Payer: Medicare Other | Attending: Internal Medicine

## 2020-12-27 ENCOUNTER — Other Ambulatory Visit (HOSPITAL_BASED_OUTPATIENT_CLINIC_OR_DEPARTMENT_OTHER): Payer: Self-pay

## 2020-12-27 ENCOUNTER — Other Ambulatory Visit: Payer: Self-pay

## 2020-12-27 ENCOUNTER — Ambulatory Visit (INDEPENDENT_AMBULATORY_CARE_PROVIDER_SITE_OTHER): Payer: Medicare Other | Admitting: Medical

## 2020-12-27 VITALS — BP 120/70 | HR 70 | Temp 97.7°F | Resp 18 | Ht 75.0 in | Wt 231.8 lb

## 2020-12-27 DIAGNOSIS — I10 Essential (primary) hypertension: Secondary | ICD-10-CM | POA: Diagnosis not present

## 2020-12-27 DIAGNOSIS — E785 Hyperlipidemia, unspecified: Secondary | ICD-10-CM

## 2020-12-27 DIAGNOSIS — R739 Hyperglycemia, unspecified: Secondary | ICD-10-CM

## 2020-12-27 DIAGNOSIS — Z23 Encounter for immunization: Secondary | ICD-10-CM

## 2020-12-27 DIAGNOSIS — N189 Chronic kidney disease, unspecified: Secondary | ICD-10-CM | POA: Diagnosis not present

## 2020-12-27 DIAGNOSIS — N289 Disorder of kidney and ureter, unspecified: Secondary | ICD-10-CM

## 2020-12-27 MED ORDER — INFLUENZA VAC A&B SA ADJ QUAD 0.5 ML IM PRSY
PREFILLED_SYRINGE | INTRAMUSCULAR | 0 refills | Status: DC
  Filled 2020-12-27: qty 0.5, 1d supply, fill #0

## 2020-12-27 MED ORDER — ATENOLOL 25 MG PO TABS
ORAL_TABLET | Freq: Every evening | ORAL | 3 refills | Status: DC
  Filled 2020-12-27: qty 90, 90d supply, fill #0
  Filled 2021-03-21: qty 90, 90d supply, fill #1
  Filled 2021-06-19: qty 90, 90d supply, fill #2
  Filled 2021-08-27: qty 90, 90d supply, fill #3

## 2020-12-27 NOTE — Progress Notes (Signed)
   Covid-19 Vaccination Clinic  Name:  Vincent Walker    MRN: [id number] DOB: 04-05-1945  12/27/2020  [name] was observed post Covid-19 immunization for 15 minutes without incident. He was provided with Vaccine Information Sheet and instruction to access the V-Safe system.   [name] was instructed to call 911 with any severe reactions post vaccine: Difficulty breathing  Swelling of face and throat  A fast heartbeat  A bad rash all over body  Dizziness and weakness

## 2020-12-27 NOTE — Patient Instructions (Addendum)
Hx of htn. Some white coat htn history and today at home bp well controlled with manual check. Wife Therapist, sports. Continue current tenormin and amlodipine.  For pvc controlled with tenormin.   For hx of parkinson continue sinemet and follow up with Dr. Carles Collet.  Hx of high cholesterol. Continue atorvastatin 20 mg daily. Will get cmp and lipid panel today.  For insomnia and some anxiety continue remeron and clonazepam.   For elevated sugar in past get a1c.  Follow up date to be determined after lab review.

## 2020-12-27 NOTE — Progress Notes (Signed)
Subjective:    Patient ID: Vincent Walker, male    DOB: 05/16/1945, 75 y.o.   MRN: [id number]  HPI  Pt in for follow up.  Pt has not been seen since onset of pandemic.  Pt will get flu and 4th covid vaccination.  Pt mild high initially but at home 120/70. Wife retired Marine scientist. On tenormin and amlodipine.  Historically in summer his bp drops. If that happens will hold amlodipine.   On b blocker for pvc.   Hx of parkinsons. Pt is sinemet cr and he sees Dr. Carles Collet. On remeron for insomnia. On clonazepam for anxiety.  Hx of high cholesterol. On atorvastatin 20 mg daily.    Review of Systems  Constitutional:  Negative for appetite change, diaphoresis, fatigue and fever.  Respiratory:  Negative for cough, chest tightness, shortness of breath, wheezing and stridor.   Cardiovascular:  Negative for chest pain and palpitations.  Gastrointestinal:  Negative for abdominal pain.  Musculoskeletal:  Negative for back pain, joint swelling, myalgias and neck pain.  Skin:  Negative for rash.  Neurological:  Negative for dizziness, seizures, syncope, weakness, numbness and headaches.       Pt has hx of parkinsons.  Hematological:  Negative for adenopathy. Does not bruise/bleed easily.  Psychiatric/Behavioral:  Negative for behavioral problems and confusion.    Past Medical History:  Diagnosis Date   Adenomatous polyp of ascending colon 10/09/2018   Allergies 07/07/2018   Arthritis    Benign essential hypertension 02/08/2015   Chronic renal insufficiency 02/14/2015   Constipation 12/23/2015   Dyslipidemia 02/08/2015   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 Required nasal packing a couple of days ago.  Had a similar episode of epistaxis a little over a year ago.  At that visit, I could not see the exact bleeding spot. EXAMINATION after packing removal reveals several excoriated  areas anteriorly but I was unable to see t   Glaucoma    History of blood transfusion 2010   After Kidney surgery   History of chicken pox    History of renal cell carcinoma 02/08/2015   diagnosed and removed in 2010 Right Monitored by Dr Tresa Endo of urology at [facility] Dr Richarda Overlie, nephrology at [facility]   Hyperglycemia 12/23/2015   Hyperlipidemia    Increased thyroid stimulating hormone (TSH) level 06/07/2017   Ingrown left big toenail 02/14/2015   Left foot pain 02/14/2015   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   MVP (mitral valve prolapse) 05/14/2016   Parkinson's disease 12/23/2015   Right hip pain 12/12/2016   Thrombocytopenia 02/08/2015   Tremor of right hand 02/14/2015     Social History   Socioeconomic History   Marital status: Married    Spouse name: Not on file   Number of children: 3   Years of education: 16   Highest education level: Bachelor's degree (e.g., BA, AB, BS)  Occupational History   Occupation: retired Freight forwarder in [employer]  Tobacco Use   Smoking status: Former    Types: Pipe    Quit date: 11/18/1995    Years since quitting: 25.1   Smokeless tobacco: Never   Tobacco comments:    stopped 20 years ago.  1996  Vaping Use   Vaping Use: Never used  Substance and Sexual Activity   Alcohol use: Not Currently    Comment: rare/ social   Drug use: No  Sexual activity: Yes    Comment: lives with wife, retired from Naval architect in plant, no major dietary restrictions   Other Topics Concern   Not on file  Social History Narrative   Right Handed   Lives in a one story home   Drinks one cup of coffee a day   Social Determinants of Health   Financial Resource Strain: Not on file  Food Insecurity: Not on file  Transportation Needs: Not on file  Physical Activity: Not on file  Stress: Not on file  Social Connections: Not on file  Intimate Partner Violence: Not on file    Past Surgical History:   Procedure Laterality Date   APPENDECTOMY  1995   CATARACT EXTRACTION, BILATERAL     COLONOSCOPY     COLONOSCOPY WITH PROPOFOL N/A 12/17/2018   Procedure: COLONOSCOPY WITH PROPOFOL;  Surgeon: Irving Copas., MD;  Location: [place];  Service: Gastroenterology;  Laterality: N/A;   ENDOSCOPIC MUCOSAL RESECTION N/A 12/17/2018   Procedure: ENDOSCOPIC MUCOSAL RESECTION;  Surgeon: Rush Landmark Telford Nab., MD;  Location: [place];  Service: Gastroenterology;  Laterality: N/A;   HEMOSTASIS CLIP PLACEMENT  12/17/2018   Procedure: HEMOSTASIS CLIP PLACEMENT;  Surgeon: Irving Copas., MD;  Location: [place];  Service: Gastroenterology;;   left knee scope  2003   POLYPECTOMY  12/17/2018   Procedure: POLYPECTOMY;  Surgeon: Mansouraty, Telford Nab., MD;  Location: [place];  Service: Gastroenterology;;   right knee scope  Zinc INJECTION  12/17/2018   Procedure: SUBMUCOSAL LIFTING INJECTION;  Surgeon: Irving Copas., MD;  Location: [place];  Service: Gastroenterology;;   TOE SURGERY Left    metal 2nd toe- and top of foot- straighten bone   TONSILLECTOMY     TOTAL KNEE ARTHROPLASTY Left 07/06/2014   TOTAL NEPHRECTOMY Right     Family History  Problem Relation Age of Onset   Alcohol abuse Father    Hyperlipidemia Father    Hypertension Father    Diabetes Father    Heart disease Father    Cancer Maternal Aunt        lung cancer   Cancer Paternal Uncle        bone cancer   Heart attack Mother    Stroke Mother        swelling in brain stem   Healthy Daughter    Healthy Son    Healthy Daughter    Colon cancer Neg Hx    Esophageal cancer Neg Hx    Stomach cancer Neg Hx    Inflammatory bowel disease Neg Hx    Liver disease Neg Hx    Pancreatic cancer Neg Hx    Rectal cancer Neg Hx     Allergies  Allergen Reactions   Nsaids    Sulfa Antibiotics Other (See Comments)    Had a reaction as a child   Tolmetin Other (See Comments)     Current Outpatient Medications on File Prior to Visit  Medication Sig Dispense Refill   clonazePAM (KLONOPIN) 0.5 MG tablet TAKE 1/2 TABLET (0.25 MG TOTAL) BY MOUTH AT BEDTIME. 45 tablet 0   acetaminophen (TYLENOL) 500 MG tablet Take 1,000 mg by mouth every 6 (six) hours as needed (pain.).      amLODipine (NORVASC) 2.5 MG tablet Take 1/2 - 1 tablets (1.25-2.5 mg total) by mouth daily. 30 tablet 3   aspirin EC 81 MG tablet Take 81 mg by mouth at bedtime.     atenolol (TENORMIN)  25 MG tablet TAKE 1 TABLET BY MOUTH EVERY EVENING 30 tablet 0   atorvastatin (LIPITOR) 20 MG tablet Take 1 tablet (20 mg total) by mouth daily. 90 tablet 0   brimonidine (ALPHAGAN) 0.2 % ophthalmic solution Place 1 drop into both eyes 2 (two) times daily.     carbidopa-levodopa (SINEMET CR) 50-200 MG tablet TAKE 1 TABLET BY MOUTH AT BEDTIME. 90 tablet 1   Carbidopa-Levodopa ER (SINEMET CR) 25-100 MG tablet controlled release Take 2 tablets by mouth at 6am, 2 tabets at 10am, 1 tablet at 2pm, and 1 tablet at 6pm daily 540 tablet 0   divalproex (DEPAKOTE ER) 250 MG 24 hr tablet TAKE 1 TABLET (250 MG TOTAL) BY MOUTH DAILY. 90 tablet 1   fluticasone (CUTIVATE) 0.05 % cream Apply twice a day to affected areas on face for 1 week on 1 week off as needed for flares 30 g 1   mirtazapine (REMERON) 15 MG tablet TAKE 1 TABLET (15 MG TOTAL) BY MOUTH AT BEDTIME. 90 tablet 0   Multiple Vitamins-Minerals (PRESERVISION AREDS 2 PO) Take 1 tablet by mouth daily.     Polyethyl Glycol-Propyl Glycol (LUBRICANT EYE DROPS) 0.4-0.3 % SOLN Place 1 drop into both eyes 3 (three) times daily as needed (dry/irritated eyes.).     sodium chloride (OCEAN) 0.65 % SOLN nasal spray Place 1 spray into both nostrils as needed for congestion. 88 mL 2   No current facility-administered medications on file prior to visit.    BP 120/70   Pulse 70   Temp 97.7 F (36.5 C)   Resp 18   Ht 6\' 3"  (1.905 m)   Wt 231 lb 12.8 oz (105.1 kg)   SpO2 97%   BMI  28.97 kg/m        Objective:   Physical Exam   General- No acute distress. Pleasant patient. Neck- Full range of motion, no jvd Lungs- Clear, even and unlabored. Heart- regular rate and rhythm. Neurologic- CNII- XII grossly intact. Some upper ext tremors. Rt side appears to shake more.     Assessment & Plan:   Patient Instructions  Hx of htn. Some white coat htn history and today at home bp well controlled with manual check. Wife Therapist, sports. Continue current tenormin and amlodipine.  For pvc controlled with tenormin.   For hx of parkinson continue sinemet and follow up with Dr. Carles Collet.  Hx of high cholesterol. Continue atorvastatin 20 mg daily. Will get cmp and lipid panel today.  For insomnia and some anxiety continue remeron and clonazepam.   For elevated sugar in past get a1c.  Follow up date to be determined after lab review.    Mackie Pai, PA-C

## 2020-12-28 ENCOUNTER — Encounter: Payer: Self-pay | Admitting: Medical

## 2020-12-28 LAB — LIPID PANEL
Cholesterol: 110 mg/dL (ref 0–200)
HDL: 44.3 mg/dL (ref >39.00)
LDL Cholesterol: 28 mg/dL (ref 0–99)
NonHDL: 65.57
Total CHOL/HDL Ratio: 2
Triglycerides: 187 mg/dL — ABNORMAL HIGH (ref 0.0–149.0)
VLDL: 37.4 mg/dL (ref 0.0–40.0)

## 2020-12-28 LAB — COMPREHENSIVE METABOLIC PANEL
ALT: 12 U/L (ref 0–53)
AST: 21 U/L (ref 0–37)
Albumin: 4.5 g/dL (ref 3.5–5.2)
Alkaline Phosphatase: 82 U/L (ref 39–117)
BUN: 17 mg/dL (ref 6–23)
CO2: 27 mEq/L (ref 19–32)
Calcium: 10 mg/dL (ref 8.4–10.5)
Chloride: 104 mEq/L (ref 96–112)
Creatinine, Ser: 1.4 mg/dL (ref 0.40–1.50)
GFR: 48.44 mL/min — ABNORMAL LOW (ref >60.00)
Glucose, Bld: 114 mg/dL — ABNORMAL HIGH (ref 70–99)
Potassium: 4.5 mEq/L (ref 3.5–5.1)
Sodium: 139 mEq/L (ref 135–145)
Total Bilirubin: 0.5 mg/dL (ref 0.2–1.2)
Total Protein: 6.8 g/dL (ref 6.0–8.3)

## 2020-12-28 LAB — HEMOGLOBIN A1C: Hgb A1c MFr Bld: 6 % (ref 4.6–6.5)

## 2020-12-28 MED ORDER — ATORVASTATIN CALCIUM 20 MG PO TABS
20.0000 mg | ORAL_TABLET | Freq: Every day | ORAL | 3 refills | Status: DC
  Filled 2020-12-28: qty 90, 90d supply, fill #0
  Filled 2021-03-23: qty 90, 90d supply, fill #1
  Filled 2021-06-19: qty 90, 90d supply, fill #2
  Filled 2021-09-21: qty 90, 90d supply, fill #3

## 2020-12-28 NOTE — Addendum Note (Signed)
Addended by: Anabel Halon on: 12/28/2020 10:08 PM   Modules accepted: Orders

## 2020-12-29 ENCOUNTER — Other Ambulatory Visit (HOSPITAL_BASED_OUTPATIENT_CLINIC_OR_DEPARTMENT_OTHER): Payer: Self-pay

## 2021-01-06 ENCOUNTER — Other Ambulatory Visit (HOSPITAL_BASED_OUTPATIENT_CLINIC_OR_DEPARTMENT_OTHER): Payer: Self-pay

## 2021-01-06 MED ORDER — COVID-19MRNA BIVAL VACC PFIZER 30 MCG/0.3ML IM SUSP
INTRAMUSCULAR | 0 refills | Status: DC
  Filled 2021-01-06: qty 0.3, 1d supply, fill #0

## 2021-01-11 ENCOUNTER — Other Ambulatory Visit (HOSPITAL_BASED_OUTPATIENT_CLINIC_OR_DEPARTMENT_OTHER): Payer: Self-pay

## 2021-01-11 ENCOUNTER — Other Ambulatory Visit: Payer: Self-pay | Admitting: Neurology

## 2021-01-11 MED ORDER — CLONAZEPAM 0.5 MG PO TABS
ORAL_TABLET | ORAL | 0 refills | Status: DC
  Filled 2021-01-11: qty 45, 90d supply, fill #0

## 2021-01-23 ENCOUNTER — Other Ambulatory Visit (HOSPITAL_BASED_OUTPATIENT_CLINIC_OR_DEPARTMENT_OTHER): Payer: Self-pay

## 2021-01-26 ENCOUNTER — Other Ambulatory Visit (HOSPITAL_BASED_OUTPATIENT_CLINIC_OR_DEPARTMENT_OTHER): Payer: Self-pay

## 2021-01-26 ENCOUNTER — Other Ambulatory Visit: Payer: Self-pay | Admitting: Neurology

## 2021-01-26 DIAGNOSIS — G20 Parkinson's disease: Secondary | ICD-10-CM

## 2021-01-26 MED ORDER — MIRTAZAPINE 15 MG PO TABS
15.0000 mg | ORAL_TABLET | Freq: Every day | ORAL | 0 refills | Status: DC
Start: 2021-01-26 — End: 2021-04-24
  Filled 2021-01-26: qty 90, 90d supply, fill #0

## 2021-01-26 NOTE — Progress Notes (Signed)
Assessment/Plan:   1.  Parkinsons Disease  -I'm not convinced that the carbidopa/levodopa 25/100 CR has helped EDS and I think he clinically has gotten worse (although it may just be time has caused Parkinsons Disease to progress).  I'm going to try and switch his meds back to the carbidopa/levodopa 25/100 IR  -d/c carbidopa/levodopa 25/100 CR  -take  carbidopa/levodopa 25/100 IR, 2 at 8am/2 at 11am/2 at 2pm/1 at 6pm.    -Continue carbidopa/levodopa 50/200 at bedtime  -We discussed that it used to be thought that levodopa would increase risk of melanoma but now it is believed that Parkinsons itself likely increases risk of melanoma. he is to get regular skin checks.  He has a f/u tomorrow as he has a SCC that is being removed tomorrow  -more unsteady and needs to exercise.    -refer for PT 2.  RBD/RLS/insomnia  -increase clonazepam 0.5 mg, full tablet at bedtime.  Tried to stop this medication and things got much worse, especially mood change and daytime hypersomnolence did not get better.  -on mirtazapine, 15 mg q hs.  R/B/SE were discussed.  The opportunity to ask questions was given and they were answered to the best of my ability.  The patient expressed understanding and willingness to follow the outlined treatment protocols.  -some of night issues due to nocturia.  Needs to f/u with PCP/urology 3.  Anxiety/depression  -Being followed by primary care.  On Depakote ER, 250 mg daily.  Did offer referral to psychiatry. 4.  Daytime hypersomnolence  -Nocturnal polysomnogram was negative, but he was awake most of the night so not sure that it was accurate 5.  [facility]  -Neurocognitive testing done in July, 2022 was unremarkable.  -Patient would really like to go back to driving.  Dr. Melvyn Novas felt that neurocognitive test was not a great predictor of an individual's ability to safely operate a motor vehicle.  He did recommend a driving evaluation, which I agree with. 6.  History of hypertension  -On  2 antihypertensives.  We will need to watch this given autonomic instability associated with Parkinson's disease.   Subjective:   Vincent Walker was seen today in follow up for Parkinsons disease.  My previous records were reviewed prior to todays visit as well as outside records available to me. Pt with wife who supplements hx. Slower and more shuffling per wife.  More trouble getting OOC.  More unsteady per wife.   More tremor.   No passing out spells. More yelling out at night.  More rls. Wife reports memory not as good.  He had neurocognitive testing done with Dr. Melvyn Novas in July.  No evidence of dementia.  Had evidence of mild cognitive impairment.  Dr. Melvyn Novas did note that this was not a strong predictor of any individuals ability to safely operate a motor vehicle, and he gave them information to contact the evaluator driving company out [facility].  Patient saw PA on September 27.  They reported that was first-time seen since the pandemic.  Continued the patient on atenolol and amlodipine.  Current prescribed movement disorder medications: carbidopa/levodopa 25/100 CR, 2 at 6am/2 at 10am/1 at 2pm/1 at 6pm (an increase) Clonazepam 0.5 mg, half tablet at bedtime carbidopa/levodopa 50/200 at bedtime Mirtazapine, 15 mg nightly  PREVIOUS MEDICATIONS: Carbidopa/levodopa 25/100 IR  ALLERGIES:   Allergies  Allergen Reactions   Nsaids    Sulfa Antibiotics Other (See Comments)    Had a reaction as a child   Tolmetin Other (  See Comments)    CURRENT MEDICATIONS:  Outpatient Encounter Medications as of 01/27/2021  Medication Sig   acetaminophen (TYLENOL) 500 MG tablet Take 1,000 mg by mouth every 6 (six) hours as needed (pain.).    amLODipine (NORVASC) 2.5 MG tablet Take 1/2 - 1 tablets (1.25-2.5 mg total) by mouth daily.   aspirin EC 81 MG tablet Take 81 mg by mouth at bedtime.   atenolol (TENORMIN) 25 MG tablet TAKE 1 TABLET BY MOUTH EVERY EVENING   atorvastatin (LIPITOR) 20 MG tablet Take 1 tablet  (20 mg total) by mouth daily.   brimonidine (ALPHAGAN) 0.2 % ophthalmic solution Place 1 drop into both eyes 2 (two) times daily.   carbidopa-levodopa (SINEMET CR) 50-200 MG tablet TAKE 1 TABLET BY MOUTH AT BEDTIME.   Carbidopa-Levodopa ER (SINEMET CR) 25-100 MG tablet controlled release Take 2 tablets by mouth at 6am, 2 tabets at 10am, 1 tablet at 2pm, and 1 tablet at 6pm daily   clonazePAM (KLONOPIN) 0.5 MG tablet TAKE 1/2 TABLET (0.25 MG TOTAL) BY MOUTH AT BEDTIME.   divalproex (DEPAKOTE ER) 250 MG 24 hr tablet TAKE 1 TABLET (250 MG TOTAL) BY MOUTH DAILY.   fluticasone (CUTIVATE) 0.05 % cream Apply twice a day to affected areas on face for 1 week on 1 week off as needed for flares   mirtazapine (REMERON) 15 MG tablet Take 1 tablet (15 mg total) by mouth at bedtime.   Multiple Vitamins-Minerals (PRESERVISION AREDS 2 PO) Take 1 tablet by mouth daily.   Polyethyl Glycol-Propyl Glycol (LUBRICANT EYE DROPS) 0.4-0.3 % SOLN Place 1 drop into both eyes 3 (three) times daily as needed (dry/irritated eyes.).   sodium chloride (OCEAN) 0.65 % SOLN nasal spray Place 1 spray into both nostrils as needed for congestion.   COVID-19 mRNA bivalent vaccine, Pfizer, injection Inject into the muscle. (Patient not taking: Reported on 01/27/2021)   influenza vaccine adjuvanted (FLUAD) 0.5 ML injection Inject into the muscle. (Patient not taking: Reported on 01/27/2021)   [DISCONTINUED] mirtazapine (REMERON) 15 MG tablet TAKE 1 TABLET (15 MG TOTAL) BY MOUTH AT BEDTIME.   No facility-administered encounter medications on file as of 01/27/2021.    Objective:   PHYSICAL EXAMINATION:    VITALS:   Vitals:   01/27/21 1040  BP: 140/82  Pulse: 72  Resp: 18  SpO2: 98%  Weight: 230 lb (104.3 kg)  Height: 6\' 3"  (1.905 m)     GEN:  The patient appears stated age and is in NAD. HEENT:  Normocephalic, atraumatic.  The mucous membranes are moist. The superficial temporal arteries are without ropiness or  tenderness. CV:  RRR Lungs:  CTAB Neck/HEME:  There are no carotid bruits bilaterally.  Neurological examination:  Orientation: The patient is alert and oriented x3. Cranial nerves: There is good facial symmetry with facial hypomimia. The speech is fluent and clear. Soft palate rises symmetrically and there is no tongue deviation. Hearing is intact to conversational tone. Sensation: Sensation is intact to light touch throughout Motor: Strength is at least antigravity x4.  Movement examination: Tone: There is mod increase rigidity in the RUE Abnormal movements: there is RUE rest tremor Coordination:  There is mild decremation, with any form of RAMS, including alternating supination and pronation of the forearm, hand opening and closing, finger taps, heel taps and toe taps, R>L Gait and Station: The patient has min difficulty arising out of a deep-seated chair without the use of the hands. The patient's stride length is slightly decreased and he  drags the right leg a bit  I have reviewed and interpreted the following labs independently    Chemistry      Component Value Date/Time   NA 139 12/27/2020 1443   K 4.5 12/27/2020 1443   CL 104 12/27/2020 1443   CO2 27 12/27/2020 1443   BUN 17 12/27/2020 1443   CREATININE 1.40 12/27/2020 1443      Component Value Date/Time   CALCIUM 10.0 12/27/2020 1443   ALKPHOS 82 12/27/2020 1443   AST 21 12/27/2020 1443   ALT 12 12/27/2020 1443   BILITOT 0.5 12/27/2020 1443       Lab Results  Component Value Date   WBC 9.2 08/28/2019   HGB 14.0 08/28/2019   HCT 41.8 08/28/2019   MCV 92.1 08/28/2019   PLT 190.0 08/28/2019    Lab Results  Component Value Date   TSH 2.45 04/06/2019     Total time spent on today's visit was 41 minutes, including both face-to-face time and nonface-to-face time.  Time included that spent on review of records (prior notes available to me/labs/imaging if pertinent), discussing treatment and goals, answering  patient's questions and coordinating care.  Cc:  Mosie Lukes, MD

## 2021-01-27 ENCOUNTER — Encounter: Payer: Self-pay | Admitting: Neurology

## 2021-01-27 ENCOUNTER — Other Ambulatory Visit: Payer: Self-pay

## 2021-01-27 ENCOUNTER — Other Ambulatory Visit (HOSPITAL_BASED_OUTPATIENT_CLINIC_OR_DEPARTMENT_OTHER): Payer: Self-pay

## 2021-01-27 ENCOUNTER — Ambulatory Visit (INDEPENDENT_AMBULATORY_CARE_PROVIDER_SITE_OTHER): Payer: Medicare Other | Admitting: Neurology

## 2021-01-27 VITALS — BP 140/82 | HR 72 | Resp 18 | Ht 75.0 in | Wt 230.0 lb

## 2021-01-27 DIAGNOSIS — G4752 REM sleep behavior disorder: Secondary | ICD-10-CM | POA: Diagnosis not present

## 2021-01-27 DIAGNOSIS — G2581 Restless legs syndrome: Secondary | ICD-10-CM

## 2021-01-27 DIAGNOSIS — G20 Parkinson's disease: Secondary | ICD-10-CM

## 2021-01-27 MED ORDER — CARBIDOPA-LEVODOPA 25-100 MG PO TABS
ORAL_TABLET | ORAL | 1 refills | Status: DC
  Filled 2021-01-27: qty 630, 90d supply, fill #0
  Filled 2021-04-30: qty 630, 90d supply, fill #1

## 2021-01-27 MED ORDER — CLONAZEPAM 0.5 MG PO TABS: 0.5000 mg | ORAL_TABLET | Freq: Every day | ORAL | 0 refills | Status: DC

## 2021-01-27 NOTE — Patient Instructions (Addendum)
Follow up with urology regarding the bladder frequency Increase clonazepam, 0.5 mg at bed (instead of just half tablet) Referral to physical therapy in [place] STOP carbidopa/levodopa 25/100 CR  START carbidopa/levodopa 25/100 IR, 2 at 8am/2 at 11am/2 at 2pm/1 at 6pm.   CONTINUE carbidopa/levodopa 50/200 CR

## 2021-01-30 ENCOUNTER — Other Ambulatory Visit (HOSPITAL_BASED_OUTPATIENT_CLINIC_OR_DEPARTMENT_OTHER): Payer: Self-pay

## 2021-01-30 MED ORDER — CLONAZEPAM 0.5 MG PO TABS
0.5000 mg | ORAL_TABLET | Freq: Every day | ORAL | 1 refills | Status: DC
  Filled 2021-01-30 – 2021-03-02 (×3): qty 90, 90d supply, fill #0
  Filled 2021-05-31: qty 90, 90d supply, fill #1

## 2021-02-15 ENCOUNTER — Other Ambulatory Visit: Payer: Self-pay | Admitting: Neurology

## 2021-02-16 ENCOUNTER — Other Ambulatory Visit (HOSPITAL_BASED_OUTPATIENT_CLINIC_OR_DEPARTMENT_OTHER): Payer: Self-pay

## 2021-02-16 MED ORDER — DIVALPROEX SODIUM ER 250 MG PO TB24
250.0000 mg | ORAL_TABLET | Freq: Every day | ORAL | 1 refills | Status: DC
Start: 2021-02-16 — End: 2021-08-14
  Filled 2021-02-16: qty 90, 90d supply, fill #0
  Filled 2021-05-16: qty 90, 90d supply, fill #1

## 2021-02-21 ENCOUNTER — Ambulatory Visit (INDEPENDENT_AMBULATORY_CARE_PROVIDER_SITE_OTHER): Payer: Medicare Other | Admitting: Family

## 2021-02-21 ENCOUNTER — Telehealth: Payer: Self-pay | Admitting: Family

## 2021-02-21 ENCOUNTER — Other Ambulatory Visit: Payer: Self-pay

## 2021-02-21 VITALS — BP 166/88 | HR 76 | Temp 97.8°F | Resp 16 | Ht 75.0 in | Wt 235.0 lb

## 2021-02-21 DIAGNOSIS — H6982 Other specified disorders of Eustachian tube, left ear: Secondary | ICD-10-CM

## 2021-02-21 DIAGNOSIS — I10 Essential (primary) hypertension: Secondary | ICD-10-CM | POA: Diagnosis not present

## 2021-02-21 DIAGNOSIS — H6992 Unspecified Eustachian tube disorder, left ear: Secondary | ICD-10-CM | POA: Insufficient documentation

## 2021-02-21 HISTORY — DX: Unspecified Eustachian tube disorder, left ear: H69.92

## 2021-02-21 NOTE — Assessment & Plan Note (Signed)
New.  No sign of OM on exam today. Pt is advised as follows:  For ear pain, please begin flonase 2 sprays each nostril once daily and add clartin once daily.  You may also use tylenol as needed. Please call if new/worsening symptoms or if symptoms do not improve over the next 1 week.

## 2021-02-21 NOTE — Patient Instructions (Signed)
For ear pain, please begin flonase 2 sprays each nostril once daily and add clartin once daily.  You may also use tylenol as needed. Please call if new/worsening symptoms or if symptoms do not improve over the next 1 week.

## 2021-02-21 NOTE — Progress Notes (Signed)
Subjective:   By signing my name below, I, [name], attest that this documentation has been prepared under the direction and in the presence of [name], NP, 02/21/2021     Patient ID: Vincent Walker, male    DOB: 11-03-1945, 75 y.o.   MRN: [id number]  Chief Complaint  Patient presents with   Otalgia    Complains of pain on left ear    HPI Patient is in today for an office visit.  Ear pain: He notes that he has never had an ear infection before and this is the first time he is experiencing it. He reports that Sunday was the first day he began to feel the pain after waking up. He notes that the pain worsened throughout the day and he woke up on Monday and there was no more pain. As the day went on again, his pain reoccurred and worsened so he has made this appointment. He denies any fever or drainage but does report that the pain will travel down his neck but is primarily in the canal.    Health Maintenance Due  Topic Date Due   Pneumonia Vaccine 39+ Years old (1 - PCV) Never done   Hepatitis C Screening  Never done   TETANUS/TDAP  Never done   Zoster Vaccines- Shingrix (1 of 2) Never done    Past Medical History:  Diagnosis Date   Adenomatous polyp of ascending colon 10/09/2018   Allergies 07/07/2018   Arthritis    Benign essential hypertension 02/08/2015   Chronic renal insufficiency 02/14/2015   Constipation 12/23/2015   Dyslipidemia 02/08/2015   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 Required nasal packing a couple of days ago.  Had a similar episode of epistaxis a little over a year ago.  At that visit, I could not see the exact bleeding spot. 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   MVP (mitral valve prolapse) 05/14/2016   Parkinson's disease 12/23/2015   Right hip pain 12/12/2016   Thrombocytopenia 02/08/2015   Tremor of right hand 02/14/2015    Past Surgical History:  Procedure Laterality Date   APPENDECTOMY  1995   CATARACT EXTRACTION, BILATERAL     COLONOSCOPY     COLONOSCOPY WITH PROPOFOL N/A 12/17/2018   Procedure: COLONOSCOPY WITH PROPOFOL;  Surgeon: Irving Copas., MD;  Location: [place];  Service: Gastroenterology;  Laterality: N/A;   ENDOSCOPIC MUCOSAL RESECTION N/A 12/17/2018   Procedure: ENDOSCOPIC MUCOSAL RESECTION;  Surgeon: Rush Landmark Telford Nab., MD;  Location: [place];  Service: Gastroenterology;  Laterality: N/A;   HEMOSTASIS CLIP PLACEMENT  12/17/2018   Procedure: HEMOSTASIS CLIP PLACEMENT;  Surgeon: Irving Copas., MD;  Location: [place];  Service: Gastroenterology;;   left knee scope  2003   POLYPECTOMY  12/17/2018   Procedure: POLYPECTOMY;  Surgeon: Rush Landmark Telford Nab., MD;  Location: [place];  Service: Gastroenterology;;   right knee scope  [place] INJECTION  12/17/2018   Procedure: SUBMUCOSAL LIFTING INJECTION;  Surgeon: Irving Copas., MD;  Location: [place];  Service: Gastroenterology;;   TOE SURGERY Left    metal 2nd toe- and top of foot- straighten bone   TONSILLECTOMY     TOTAL KNEE ARTHROPLASTY Left 07/06/2014   TOTAL NEPHRECTOMY Right     Family History  Problem Relation Age of Onset   Alcohol abuse Father    Hyperlipidemia Father     Hypertension Father    Diabetes Father    Heart disease Father    Cancer Maternal Aunt        lung cancer   Cancer Paternal Uncle        bone cancer   Heart attack Mother    Stroke Mother        swelling in brain stem   Healthy Daughter    Healthy Son    Healthy Daughter    Colon cancer Neg Hx    Esophageal cancer Neg Hx    Stomach cancer Neg Hx    Inflammatory bowel disease Neg Hx    Liver disease Neg Hx    Pancreatic cancer Neg Hx    Rectal cancer Neg Hx     Social History   Socioeconomic History   Marital status: Married    Spouse name: Not on file   Number of children: 3   Years of education: 16   Highest education level: Bachelor's degree (e.g., BA, AB, BS)  Occupational History   Occupation: retired Freight forwarder in [employer]  Tobacco Use   Smoking status: Former    Types: Pipe    Quit date: 11/18/1995    Years since quitting: 25.2   Smokeless tobacco: Never   Tobacco comments:    stopped 20 years ago.  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, no major dietary restrictions   Other Topics Concern   Not on file  Social History Narrative   Right Handed   Lives in a one story home   Drinks one cup of coffee a day   Social Determinants of Health   Financial Resource Strain: Not on file  Food Insecurity: Not on file  Transportation Needs: Not on file  Physical Activity: Not on file  Stress: Not on file  Social Connections: Not on file  Intimate Partner Violence: Not on file    Outpatient Medications Prior to Visit  Medication Sig Dispense Refill   acetaminophen (TYLENOL) 500 MG tablet Take 1,000 mg by mouth every 6 (six) hours as needed (pain.).      amLODipine (NORVASC) 2.5 MG tablet Take 1/2 - 1 tablets (1.25-2.5 mg total) by mouth daily. 30  tablet 3   aspirin EC 81 MG tablet Take 81 mg by mouth at bedtime.     atenolol (TENORMIN) 25 MG tablet TAKE 1 TABLET BY MOUTH EVERY EVENING 90 tablet 3   atorvastatin (LIPITOR) 20 MG tablet Take 1 tablet (20 mg total) by mouth daily. 90 tablet 3   brimonidine (ALPHAGAN) 0.2 % ophthalmic solution Place 1 drop into both eyes 2 (two) times daily.  carbidopa-levodopa (SINEMET CR) 50-200 MG tablet TAKE 1 TABLET BY MOUTH AT BEDTIME. 90 tablet 1   carbidopa-levodopa (SINEMET IR) 25-100 MG tablet Take 2 tablets by mouth at 8am/2 at 11am/2 at 2pm/1 at 6pm. 630 tablet 1   clonazePAM (KLONOPIN) 0.5 MG tablet Take 1 tablet (0.5 mg total) by mouth at bedtime. 90 tablet 1   COVID-19 mRNA bivalent vaccine, Pfizer, injection Inject into the muscle. 0.3 mL 0   divalproex (DEPAKOTE ER) 250 MG 24 hr tablet Take 1 tablet (250 mg total) by mouth daily. 90 tablet 1   fluticasone (CUTIVATE) 0.05 % cream Apply twice a day to affected areas on face for 1 week on 1 week off as needed for flares 30 g 1   influenza vaccine adjuvanted (FLUAD) 0.5 ML injection Inject into the muscle. 0.5 mL 0   mirtazapine (REMERON) 15 MG tablet Take 1 tablet (15 mg total) by mouth at bedtime. 90 tablet 0   Multiple Vitamins-Minerals (PRESERVISION AREDS 2 PO) Take 1 tablet by mouth daily.     Polyethyl Glycol-Propyl Glycol (LUBRICANT EYE DROPS) 0.4-0.3 % SOLN Place 1 drop into both eyes 3 (three) times daily as needed (dry/irritated eyes.).     sodium chloride (OCEAN) 0.65 % SOLN nasal spray Place 1 spray into both nostrils as needed for congestion. 88 mL 2   No facility-administered medications prior to visit.    Allergies  Allergen Reactions   Nsaids    Sulfa Antibiotics Other (See Comments)    Had a reaction as a child   Tolmetin Other (See Comments)    Review of Systems  Constitutional:  Negative for fever.  HENT:  Positive for ear pain. Negative for ear discharge.        (-) jaw pain (-) tooth pain      Objective:     Physical Exam Constitutional:      General: He is not in acute distress.    Appearance: Normal appearance. He is not ill-appearing.  HENT:     Head: Normocephalic and atraumatic.     Right Ear: Tympanic membrane, ear canal and external ear normal.     Left Ear: Tympanic membrane, ear canal and external ear normal.     Mouth/Throat:     Mouth: Mucous membranes are moist.     Pharynx: Oropharynx is clear. No oropharyngeal exudate or posterior oropharyngeal erythema.  Eyes:     Extraocular Movements: Extraocular movements intact.     Pupils: Pupils are equal, round, and reactive to light.  Lymphadenopathy:     Cervical: No cervical adenopathy.  Skin:    General: Skin is warm and dry.  Neurological:     Mental Status: He is alert and oriented to person, place, and time.  Psychiatric:        Behavior: Behavior normal.        Judgment: Judgment normal.    BP (!) 166/88 (BP Location: Right Arm, Patient Position: Sitting, Cuff Size: Large)   Pulse 76   Temp 97.8 F (36.6 C) (Oral)   Resp 16   Ht 6\' 3"  (1.905 m)   Wt 235 lb (106.6 kg)   SpO2 99%   BMI 29.37 kg/m  Wt Readings from Last 3 Encounters:  02/21/21 235 lb (106.6 kg)  01/27/21 230 lb (104.3 kg)  12/27/20 231 lb 12.8 oz (105.1 kg)       Assessment & Plan:   Problem List Items Addressed This Visit       Unprioritized  Eustachian tube dysfunction, left - Primary    New.  No sign of OM on exam today. Pt is advised as follows:  For ear pain, please begin flonase 2 sprays each nostril once daily and add clartin once daily.  You may also use tylenol as needed. Please call if new/worsening symptoms or if symptoms do not improve over the next 1 week.       No orders of the defined types were placed in this encounter.     I, [name], NP, personally preformed the services described in this documentation.  All medical record entries made by the scribe were at my direction and in my presence.  I have  reviewed the chart and discharge instructions (if applicable) and agree that the record reflects my personal performance and is accurate and complete. 02/21/2021  I,[name],acting as a Education administrator for Nance Pear, NP.,have documented all relevant documentation on the behalf of Nance Pear, NP,as directed by  Nance Pear, NP while in the presence of Nance Pear, NP.  Nance Pear, NP

## 2021-02-21 NOTE — Assessment & Plan Note (Addendum)
BP is elevated a bit today- ? Due to ear pain.  Last 2 visits have been at goal. See mychart message to pt.   BP Readings from Last 3 Encounters:  02/21/21 (!) 166/88  01/27/21 140/82  12/27/20 120/70

## 2021-02-21 NOTE — Telephone Encounter (Signed)
See Mychart.

## 2021-02-27 ENCOUNTER — Other Ambulatory Visit (HOSPITAL_BASED_OUTPATIENT_CLINIC_OR_DEPARTMENT_OTHER): Payer: Self-pay

## 2021-02-27 ENCOUNTER — Encounter: Payer: Self-pay | Admitting: Physical Therapy

## 2021-02-27 ENCOUNTER — Ambulatory Visit: Payer: Medicare Other | Attending: Neurology | Admitting: Physical Therapy

## 2021-02-27 ENCOUNTER — Other Ambulatory Visit: Payer: Self-pay

## 2021-02-27 DIAGNOSIS — M6281 Muscle weakness (generalized): Secondary | ICD-10-CM | POA: Insufficient documentation

## 2021-02-27 DIAGNOSIS — R2689 Other abnormalities of gait and mobility: Secondary | ICD-10-CM | POA: Insufficient documentation

## 2021-02-27 DIAGNOSIS — G20 Parkinson's disease: Secondary | ICD-10-CM | POA: Insufficient documentation

## 2021-02-27 DIAGNOSIS — R2681 Unsteadiness on feet: Secondary | ICD-10-CM | POA: Insufficient documentation

## 2021-02-27 NOTE — Therapy (Addendum)
[facility] [address]  [place], [place], [address] Phone: [phone]   Fax:  [phone]  Physical Therapy Evaluation  Patient Details  Name: Vincent Walker MRN: [id number] Date of Birth: December 04, 1945 Referring Provider (PT): Alonza Bogus, MD   Encounter Date: 02/27/2021   PT End of Session - 02/27/21 0851     Visit Number 1    Number of Visits 12    Date for PT Re-Evaluation 04/10/21    Authorization Type Medicare & Federal BCBS    PT Start Time [phone]    PT Stop Time 0946    PT Time Calculation (min) 55 min    Activity Tolerance Patient tolerated treatment well    Behavior During Therapy [facility] for tasks assessed/performed             Past Medical History:  Diagnosis Date   Adenomatous polyp of ascending colon 10/09/2018   Allergies 07/07/2018   Arthritis    Benign essential hypertension 02/08/2015   Chronic renal insufficiency 02/14/2015   Constipation 12/23/2015   Dyslipidemia 02/08/2015   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 Required nasal packing a couple of days ago.  Had a similar episode of epistaxis a little over a year ago.  At that visit, I could not see the exact bleeding spot. EXAMINATION after packing removal reveals several excoriated areas anteriorly but I was unable to see t   Glaucoma    History of blood transfusion 2010   After Kidney surgery   History of chicken pox    History of renal cell carcinoma 02/08/2015   diagnosed and removed in 2010 Right Monitored by Dr Tresa Endo of urology at [facility] Dr Richarda Overlie, nephrology at [facility]   Hyperglycemia 12/23/2015   Hyperlipidemia    Increased thyroid stimulating hormone (TSH) level 06/07/2017   Ingrown left big toenail 02/14/2015   Left foot pain 02/14/2015   Mild neurocognitive disorder due to Parkinson's disease  10/21/2020   MVP (mitral valve prolapse) 05/14/2016   Parkinson's disease 12/23/2015   Right hip pain 12/12/2016   Thrombocytopenia 02/08/2015   Tremor of right hand 02/14/2015    Past Surgical History:  Procedure Laterality Date   APPENDECTOMY  1995   CATARACT EXTRACTION, BILATERAL     COLONOSCOPY     COLONOSCOPY WITH PROPOFOL N/A 12/17/2018   Procedure: COLONOSCOPY WITH PROPOFOL;  Surgeon: Irving Copas., MD;  Location: [place];  Service: Gastroenterology;  Laterality: N/A;   ENDOSCOPIC MUCOSAL RESECTION N/A 12/17/2018   Procedure: ENDOSCOPIC MUCOSAL RESECTION;  Surgeon: Rush Landmark Telford Nab., MD;  Location: [place];  Service: Gastroenterology;  Laterality: N/A;   HEMOSTASIS CLIP PLACEMENT  12/17/2018   Procedure: HEMOSTASIS CLIP PLACEMENT;  Surgeon: Irving Copas., MD;  Location: [place];  Service: Gastroenterology;;   left knee scope  2003   POLYPECTOMY  12/17/2018   Procedure: POLYPECTOMY;  Surgeon: Rush Landmark Telford Nab., MD;  Location: [place];  Service: Gastroenterology;;   right knee scope  [place] INJECTION  12/17/2018   Procedure: SUBMUCOSAL LIFTING INJECTION;  Surgeon: Irving Copas., MD;  Location: [place];  Service: Gastroenterology;;   TOE SURGERY Left    metal 2nd toe- and top of foot- straighten bone   TONSILLECTOMY     TOTAL KNEE ARTHROPLASTY Left 07/06/2014   TOTAL NEPHRECTOMY Right     There were no  vitals filed for this visit.    Subjective Assessment - 02/27/21 0854     Subjective Pt reports 2+ years since diagnosis of Parkinson's - 1st time in PT for PD. Feels like it was initially sneaking up on him but now things are going downhill faster - notes less stamina as well as weakness in hips and knees. Has had trouble with his knees for a long time - L TKA & R scope - but no pain currently. Carries a cane around for balance. Medications make him sleepy.    Limitations Walking;House hold  activities    How long can you walk comfortably? wife notes he slows down    Patient Stated Goals "To get some stamina and strength back in hips and knees."    Currently in Pain? No/denies                [facility] PT Assessment - 02/27/21 0851       Assessment   Medical Diagnosis Parkinson's disease    Referring Provider (PT) Alonza Bogus, MD    Onset Date/Surgical Date --   2+ years   Hand Dominance Right    Next MD Visit 08/29/21   Prior Therapy h/o PT s/p TKA and for back      Balance Screen   Has the patient fallen in the past 6 months No    Has the patient had a decrease in activity level because of a fear of falling?  No    Is the patient reluctant to leave their home because of a fear of falling?  No      Home Environment   Living Environment Private residence    Living Arrangements Spouse/significant other    Type of [place] Access Level entry    [place] - single point;Walker - 2 wheels;Grab bars - toilet;Grab bars - tub/shower   recumbent bike     Prior Function   Level of Independence Independent   occasional trouble putting socks on   Vocation Retired    Building control surveyor uses recumbent bike; otherwise most sedentary; used to Agricultural consultant   Overall Cognitive Status Impaired/Different from baseline    Memory Impaired    Memory Impairment Decreased recall of new information;Decreased short term Civil engineer, contracting Motor Movements are Fluid and Coordinated No    Fine Motor Movements are Fluid and Coordinated No   pill rolling tremor   Heel Shin Test unable to line heel up with shin      ROM / Strength   AROM / PROM / Strength Strength      Strength   Overall Strength Comments tested in stiting    Strength Assessment Site Hip;Knee;Ankle    Right/Left Hip Right;Left    Right Hip Flexion 4+/5    Right Hip Extension 4/5    Right Hip External Rotation  4+/5    Right Hip Internal Rotation  4+/5    Right Hip ABduction 4+/5    Right Hip ADduction 4+/5    Left Hip Flexion 4+/5    Left Hip Extension 4/5    Left Hip External Rotation 4+/5    Left Hip Internal Rotation 4+/5    Left Hip ABduction 4+/5    Left Hip ADduction 4+/5    Right/Left Knee Right;Left    Right Knee Flexion 4+/5    Right Knee  Extension 4/5    Left Knee Flexion 4+/5    Left Knee Extension 4/5    Right/Left Ankle Right;Left    Right Ankle Dorsiflexion 4+/5    Right Ankle Plantar Flexion 4-/5   very limited lift with SLS heel raises & fatigues quickly   Left Ankle Dorsiflexion 4+/5    Left Ankle Plantar Flexion 4-/5   very limited lift with SLS heel raises & fatigues quickly     Ambulation/Gait   Ambulation/Gait Yes    Ambulation/Gait Assistance 5: Supervision    Assistive device Straight cane;None   cane more carried than used in proper sequence with gait   Gait Pattern Step-through pattern;Decreased stride length;Decreased hip/knee flexion - right;Decreased hip/knee flexion - left;Poor foot clearance - left;Poor foot clearance - right;Shuffle;Decreased trunk rotation;Decreased arm swing - right;Decreased arm swing - left    Ambulation Surface Level;Indoor    Gait velocity 2.78 ft/sec      Standardized Balance Assessment   Standardized Balance Assessment Five Times Sit to Stand;Timed Up and Go Test;10 meter walk test;Berg Balance Test    Five times sit to stand comments  16.07 sec w/o UE assist   16 sec indicates the thrreshold for increased fall risk   10 Meter Walk 11.19 sec      Berg Balance Test   Sit to Stand Able to stand without using hands and stabilize independently    Standing Unsupported Able to stand safely 2 minutes    Sitting with Back Unsupported but Feet Supported on Floor or Stool Able to sit safely and securely 2 minutes    Stand to Sit Sits safely with minimal use of hands    Transfers Able to transfer safely, minor use of hands    Standing Unsupported with Eyes Closed Able to  stand 10 seconds safely    Standing Unsupported with Feet Together Able to place feet together independently and stand 1 minute safely    From Standing, Reach Forward with Outstretched Arm Can reach confidently >25 cm (10")    From Standing Position, Pick up Object from Floor Able to pick up shoe safely and easily    From Standing Position, Turn to Look Behind Over each Shoulder Looks behind one side only/other side shows less weight shift    Turn 360 Degrees Able to turn 360 degrees safely but slowly    Standing Unsupported, Alternately Place Feet on Step/Stool Able to complete 4 steps without aid or supervision    Standing Unsupported, One Foot in Front Able to take small step independently and hold 30 seconds    Standing on One Leg Able to lift leg independently and hold equal to or more than 3 seconds    Total Score 47    Berg comment: 46-51 moderate fall risk (>50%)      Timed Up and Go Test   Normal TUG (seconds) 11.81    Manual TUG (seconds) 12    Cognitive TUG (seconds) 13      Functional Gait  Assessment   Gait assessed  Yes    Gait Level Surface Walks 20 ft, slow speed, abnormal gait pattern, evidence for imbalance or deviates 10-15 in outside of the 12 in walkway width. Requires more than 7 sec to ambulate 20 ft.    Change in Gait Speed Able to smoothly change walking speed without loss of balance or gait deviation. Deviate no more than 6 in outside of the 12 in walkway width.    Gait with Horizontal Head  Turns Performs head turns smoothly with no change in gait. Deviates no more than 6 in outside 12 in walkway width    Gait with Vertical Head Turns Performs task with slight change in gait velocity (eg, minor disruption to smooth gait path), deviates 6 - 10 in outside 12 in walkway width or uses assistive device    Gait and Pivot Turn Pivot turns safely in greater than 3 sec and stops with no loss of balance, or pivot turns safely within 3 sec and stops with mild imbalance, requires  small steps to catch balance.    Step Over Obstacle Is able to step over one shoe box (4.5 in total height) without changing gait speed. No evidence of imbalance.    Gait with Narrow Base of Support Ambulates less than 4 steps heel to toe or cannot perform without assistance.    Gait with Eyes Closed Walks 20 ft, slow speed, abnormal gait pattern, evidence for imbalance, deviates 10-15 in outside 12 in walkway width. Requires more than 9 sec to ambulate 20 ft.    Ambulating Backwards Walks 20 ft, slow speed, abnormal gait pattern, evidence for imbalance, deviates 10-15 in outside 12 in walkway width.    Steps Two feet to a stair, must use rail.    Total Score 16    FGA comment: < 19 = high risk fall                        Objective measurements completed on examination: See above findings.                PT Education - 02/27/21 [phone]     Education Details PT eval findings, interpretation of standardized testing results and anticipated POC    Person(s) Educated Patient;Spouse    Methods Explanation    Comprehension Verbalized understanding              PT Short Term Goals - 02/27/21 0946       PT SHORT TERM GOAL #1   Title Independent with initial HEP to address LE flexibility and strengthening    Status New    Target Date 03/20/21      PT SHORT TERM GOAL #2   Title Patient will verbalize understanding of techniques to reduce freezing of gait episodes    Status New    Target Date 03/27/21               PT Long Term Goals - 02/27/21 0946       PT LONG TERM GOAL #1   Title Patient will be independent with ongoing/advanced HEP for self-management at home incorporating PWR! Moves as indicated    Status New    Target Date 04/10/21      PT LONG TERM GOAL #2   Title Patient will demonstrate improved B hip, knee and ankle strength to >/= 4+/5 for improved stability and ease of mobility    Status New    Target Date 04/10/21      PT LONG TERM  GOAL #3   Title Patient will demonstrate increased gait speed to >/= 3.5 ft/sec with decreased frequency of freezing of gait episodes to reduce risk for falls with ambulation    Status New    Target Date 04/10/21      PT LONG TERM GOAL #4   Title Patient will improve Berg score to >/= 52/56 to improve safety and stability with ADLs in standing and reduce risk  for falls    Status New    Target Date 04/10/21      PT LONG TERM GOAL #5   Title Patient will improve FGA to >/= 25/30 to improve gait stability and reduce risk for falls    Status New    Target Date 04/10/21                    Plan - 02/27/21 0946     Clinical Impression Statement Vincent Walker is a 75 y/o male referred to OP PT for Parkinson's disease. He was initially diagnosed ~2+ years ago but this is his first PT encounter related to the PD diagnosis. He notes initially the changes were subtle but more recent has noted a decline in his stamina and proximal LE strength at hips and knees. He also reports R UE tremor (pill rolling at rest) which worsens with anxiety as well as worsening STM memory deficit especially with recall of new information. He does note more frequently instances of shuffling gait which can lead to freezing episodes - he carries a SPC around for balance, esp when outdoors, but denies any recent falls. He notes decreased LE flexibility but demonstrates only mild LE weakness with exception of moderate B PF weakness. Gait speed decreased at 2.78 ft/sec. All versions of TUG were below fall risk threshold and within 1.5 sec variation, however Berg score of 47/56 reveals moderate risk for falls and FGA score of 16/30 indicates a high risk for falls. Vincent Walker will benefit from skilled PT for stretching/flexibility along with core and LE stability/strength training to improve posture and balance, as well as balance and dynamic gait training incorporating PWR! Moves to improve safety and decrease risk for falls.    Personal  Factors and Comorbidities Age;Comorbidity 3+;Fitness;Past/Current Experience;Social Background;Time since onset of injury/illness/exacerbation    Comorbidities Parkinson's disease, mild neurocognitive disorder due to PD, HTN, MVP, L TKA, R knee scope, CRI, h/o renal cancer with R nephrectomy, thrombocytopenia, dyslipidemia, B macular degeneration    Examination-Activity Limitations Dressing;Locomotion Level;Transfers    Examination-Participation Restrictions Cleaning;Community Activity;Interpersonal Relationship;Shop;Yard Work    Merchant navy officer Evolving/Moderate complexity    Clinical Decision Making Moderate    Rehab Potential Good    PT Frequency 2x / week    PT Duration 6 weeks    PT Treatment/Interventions ADLs/Self Care Home Management;DME Instruction;Gait training;Stair training;Functional mobility training;Therapeutic activities;Therapeutic exercise;Balance training;Neuromuscular re-education;Patient/family education;Manual techniques;Dry needling;Passive range of motion    PT Next Visit Plan Assess flexibility, create initial HEP    Consulted and Agree with Plan of Care Patient;Family member/caregiver    Family Member Consulted wife - Vincent Walker             Patient will benefit from skilled therapeutic intervention in order to improve the following deficits and impairments:  Abnormal gait, Decreased activity tolerance, Decreased balance, Decreased coordination, Decreased endurance, Decreased knowledge of precautions, Decreased knowledge of use of DME, Decreased mobility, Decreased strength, Difficulty walking, Impaired perceived functional ability, Impaired flexibility, Improper body mechanics, Postural dysfunction  Visit Diagnosis: Other abnormalities of gait and mobility  Unsteadiness on feet  Muscle weakness (generalized)     Problem List Patient Active Problem List   Diagnosis Date Noted   Eustachian tube dysfunction, left 02/21/2021   Mild neurocognitive  disorder due to Parkinson's disease 10/21/2020   Early stage nonexudative age-related macular degeneration of both eyes 03/09/2019   Adenomatous polyp of ascending colon 10/09/2018   Epistaxis 07/07/2018   Allergies 07/07/2018   Obesity  06/15/2018   Increased thyroid stimulating hormone (TSH) level 06/07/2017   Right hip pain 12/12/2016   MVP (mitral valve prolapse) 05/14/2016   Parkinson's disease 12/23/2015   Constipation 12/23/2015   Hyperglycemia 12/23/2015   Tremor of right hand 02/14/2015   Left foot pain 02/14/2015   Chronic renal insufficiency 02/14/2015   Benign essential hypertension 02/08/2015   Dyslipidemia 02/08/2015   History of renal cell carcinoma 02/08/2015   Thrombocytopenia 02/08/2015   History of chicken pox     Percival Spanish, PT 02/27/2021, 12:51 PM  [facility] [address]  [place], [place], [address] Phone: [phone]   Fax:  [phone]  Name: Vincent Walker MRN: [id number] Date of Birth: 02-Feb-1946

## 2021-03-01 ENCOUNTER — Other Ambulatory Visit (HOSPITAL_BASED_OUTPATIENT_CLINIC_OR_DEPARTMENT_OTHER): Payer: Self-pay

## 2021-03-02 ENCOUNTER — Other Ambulatory Visit (HOSPITAL_BASED_OUTPATIENT_CLINIC_OR_DEPARTMENT_OTHER): Payer: Self-pay

## 2021-03-02 MED ORDER — BRIMONIDINE TARTRATE 0.2 % OP SOLN
1.0000 [drp] | Freq: Two times a day (BID) | OPHTHALMIC | 3 refills | Status: DC
  Filled 2021-03-02: qty 15, 90d supply, fill #0
  Filled 2021-07-09: qty 15, 90d supply, fill #1

## 2021-03-07 ENCOUNTER — Ambulatory Visit: Payer: Medicare Other | Attending: Neurology | Admitting: Physical Therapy

## 2021-03-07 ENCOUNTER — Encounter: Payer: Self-pay | Admitting: Physical Therapy

## 2021-03-07 ENCOUNTER — Other Ambulatory Visit: Payer: Self-pay

## 2021-03-07 DIAGNOSIS — R2689 Other abnormalities of gait and mobility: Secondary | ICD-10-CM | POA: Diagnosis not present

## 2021-03-07 DIAGNOSIS — M6281 Muscle weakness (generalized): Secondary | ICD-10-CM | POA: Diagnosis not present

## 2021-03-07 DIAGNOSIS — R2681 Unsteadiness on feet: Secondary | ICD-10-CM | POA: Diagnosis not present

## 2021-03-07 NOTE — Patient Instructions (Addendum)
   Access Code: [email or web address] URL: [email or web address] Date: 03/07/2021 Prepared by: Annie Paras  Exercises Supine Figure 4 Piriformis Stretch - 2-3 x daily - 7 x weekly - 3 reps - 30 sec hold Supine Piriformis Stretch with Foot on Ground - 2-3 x daily - 7 x weekly - 3 reps - 30 sec hold Hooklying Hamstring Stretch with Strap - 2-3 x daily - 7 x weekly - 3 reps - 30 sec hold Supine Quadriceps Stretch with Strap on Table - 2-3 x daily - 7 x weekly - 3 reps - 30 sec hold

## 2021-03-07 NOTE — Therapy (Signed)
[facility] [address]  [place], [place], [address] Phone: [phone]   Fax:  [phone]  Physical Therapy Treatment  Patient Details  Name: Vincent Walker MRN: [id number] Date of Birth: 10-20-1945 Referring Provider (PT): Alonza Bogus, MD   Encounter Date: 03/07/2021   PT End of Session - 03/07/21 1402     Visit Number 2    Number of Visits 12    Date for PT Re-Evaluation 04/10/21    Authorization Type Medicare & Federal BCBS    PT Start Time 4536    PT Stop Time 4680    PT Time Calculation (min) 44 min    Activity Tolerance Patient tolerated treatment well    Behavior During Therapy [facility] for tasks assessed/performed             Past Medical History:  Diagnosis Date   Adenomatous polyp of ascending colon 10/09/2018   Allergies 07/07/2018   Arthritis    Benign essential hypertension 02/08/2015   Chronic renal insufficiency 02/14/2015   Constipation 12/23/2015   Dyslipidemia 02/08/2015   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 Required nasal packing a couple of days ago.  Had a similar episode of epistaxis a little over a year ago.  At that visit, I could not see the exact bleeding spot. 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    MVP (mitral valve prolapse) 05/14/2016   Parkinson's disease 12/23/2015   Right hip pain 12/12/2016   Thrombocytopenia 02/08/2015   Tremor of right hand 02/14/2015    Past Surgical History:  Procedure Laterality Date   APPENDECTOMY  1995   CATARACT EXTRACTION, BILATERAL     COLONOSCOPY     COLONOSCOPY WITH PROPOFOL N/A 12/17/2018   Procedure: COLONOSCOPY WITH PROPOFOL;  Surgeon: Irving Copas., MD;  Location: [place];  Service: Gastroenterology;  Laterality: N/A;   ENDOSCOPIC MUCOSAL RESECTION N/A 12/17/2018   Procedure: ENDOSCOPIC MUCOSAL RESECTION;  Surgeon: Rush Landmark Telford Nab., MD;  Location: [place];  Service: Gastroenterology;  Laterality: N/A;   HEMOSTASIS CLIP PLACEMENT  12/17/2018   Procedure: HEMOSTASIS CLIP PLACEMENT;  Surgeon: Irving Copas., MD;  Location: [place];  Service: Gastroenterology;;   left knee scope  2003   POLYPECTOMY  12/17/2018   Procedure: POLYPECTOMY;  Surgeon: Rush Landmark Telford Nab., MD;  Location: [place];  Service: Gastroenterology;;   right knee scope  [place] INJECTION  12/17/2018   Procedure: SUBMUCOSAL LIFTING INJECTION;  Surgeon: Irving Copas., MD;  Location: [place];  Service: Gastroenterology;;   TOE SURGERY Left    metal 2nd toe- and top of foot- straighten bone   TONSILLECTOMY     TOTAL KNEE ARTHROPLASTY Left 07/06/2014   TOTAL NEPHRECTOMY Right     There were no  vitals filed for this visit.   Subjective Assessment - 03/07/21 1406     Subjective No concerns reported today.    Patient Stated Goals "To get some stamina and strength back in hips and knees."    Currently in Pain? No/denies                [facility] PT Assessment - 03/07/21 1402       Flexibility   Soft Tissue Assessment /Muscle Length yes    Hamstrings mod to severe tight B    Quadriceps mod tight L>R    ITB mild/mod tight B    Piriformis mod tight B    Obturator Internus mild/mod tight L>R                            [facility] Adult PT Treatment/Exercise - 03/07/21 1402       Lumbar Exercises: Stretches   Passive Hamstring Stretch Right;Left;2 reps;30 seconds    Passive Hamstring Stretch Limitations hooklying with strap    Hip Flexor Stretch Right;Left;1 rep;30 seconds    Hip Flexor Stretch Limitations mod thomas with strap    Quad Stretch Right;Left;1 rep;30 seconds    Quad Stretch Limitations prone with strap but pt unlikely to be able to set up on his own at home, therefore deferred with instructions provided for mod thomas quad/hip flexor stretch    Piriformis Stretch Right;Left;2 reps;30 seconds    Piriformis Stretch Limitations hooklying KTOS    Figure 4 Stretch 2 reps;30 seconds;Supine;With overpressure      Knee/Hip Exercises: Aerobic   Nustep L4 x 6 min (UE/LE to promote reciprocal motion)               [facility]) - 03/07/21 1402     PWR! exercises Moves in sitting    PWR! Up x10    PWR! Rock x10    PWR! Twist x10   cues for return to start position with arms wide before turning to opposite side   PWR! Step x10   cues for B LE step to each side                 PT Education - 03/07/21 1445     Education Details Initial HEP - LE stretching - Access Code: [email or web address]; Seated PWR! Moves    Methods Explanation;Demonstration;Verbal cues;Handout    Comprehension Verbalized understanding;Verbal cues required;Returned demonstration;Need further instruction              PT Short Term Goals - 03/07/21 1407       PT SHORT TERM GOAL #1   Title Independent with initial HEP to address LE flexibility and strengthening    Status On-going    Target Date 03/20/21      PT SHORT TERM GOAL #2   Title Patient will verbalize understanding of techniques to reduce freezing of gait episodes    Status On-going    Target Date 03/27/21               PT Long Term Goals - 03/07/21 1407       PT LONG TERM GOAL #1   Title Patient will be  independent with ongoing/advanced HEP for self-management at home incorporating PWR! Moves as indicated    Status On-going    Target Date 04/10/21      PT LONG TERM GOAL #2   Title Patient will demonstrate improved B hip, knee and ankle strength to >/=  4+/5 for improved stability and ease of mobility    Status On-going    Target Date 04/10/21      PT LONG TERM GOAL #3   Title Patient will demonstrate increased gait speed to >/= 3.5 ft/sec with decreased frequency of freezing of gait episodes to reduce risk for falls with ambulation    Status On-going    Target Date 04/10/21      PT LONG TERM GOAL #4   Title Patient will improve Berg score to >/= 52/56 to improve safety and stability with ADLs in standing and reduce risk for falls    Status On-going    Target Date 04/10/21      PT LONG TERM GOAL #5   Title Patient will improve FGA to >/= 25/30 to improve gait stability and reduce risk for falls    Status On-going    Target Date 04/10/21                   Plan - 03/07/21 1407     Clinical Impression Statement Vincent Walker had noted decreased flexibility on initial eval with formal assessment today revealing mod to severe tightness in majority of proximal LE muscles groups. Initial HEP instructions provided to address tightness/limited flexibility with pt able to perform good return demonstration but instructed to let PT if issues arise when he attempts the stretches at home. Introduced [employer]! Moves in sitting with explanation of rationale behind PWR! Up (posture), Rock (weight shift), Twist (trunk rotation) and Step (transition) Moves. Pt able to perform all patterns with only minimal cueing, therefore handout provided for practice at home.    Comorbidities Parkinson's disease, mild neurocognitive disorder due to PD, HTN, MVP, L TKA, R knee scope, CRI, h/o renal cancer with R nephrectomy, thrombocytopenia, dyslipidemia, B macular degeneration    Rehab Potential Good    PT Frequency 2x /  week    PT Duration 6 weeks    PT Treatment/Interventions ADLs/Self Care Home Management;DME Instruction;Gait training;Stair training;Functional mobility training;Therapeutic activities;Therapeutic exercise;Balance training;Neuromuscular re-education;Patient/family education;Manual techniques;Dry needling;Passive range of motion    PT Next Visit Plan Review intial stretching HEP & update/modify as indicated; Review seated PWR! Moves and progress to other positions as tolerated    PT Home Exercise Plan Access Code: [email or web address] (12/6);  PWR! Moves: Seated (12/6)    Consulted and Agree with Plan of Care Patient             Patient will benefit from skilled therapeutic intervention in order to improve the following deficits and impairments:  Abnormal gait, Decreased activity tolerance, Decreased balance, Decreased coordination, Decreased endurance, Decreased knowledge of precautions, Decreased knowledge of use of DME, Decreased mobility, Decreased strength, Difficulty walking, Impaired perceived functional ability, Impaired flexibility, Improper body mechanics, Postural dysfunction  Visit Diagnosis: Other abnormalities of gait and mobility  Unsteadiness on feet  Muscle weakness (generalized)     Problem List Patient Active Problem List   Diagnosis Date Noted   Eustachian tube dysfunction, left 02/21/2021   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   Early stage nonexudative age-related macular degeneration of both eyes 03/09/2019   Adenomatous polyp of ascending colon 10/09/2018   Epistaxis 07/07/2018   Allergies 07/07/2018   Obesity 06/15/2018   Increased thyroid stimulating hormone (TSH) level 06/07/2017   Right hip pain 12/12/2016   MVP (mitral valve prolapse) 05/14/2016   Parkinson's disease 12/23/2015   Constipation 12/23/2015   Hyperglycemia 12/23/2015   Tremor of right hand 02/14/2015  Left foot pain 02/14/2015   Chronic renal insufficiency  02/14/2015   Benign essential hypertension 02/08/2015   Dyslipidemia 02/08/2015   History of renal cell carcinoma 02/08/2015   Thrombocytopenia 02/08/2015   History of chicken pox     Percival Spanish, PT 03/07/2021, 6:37 PM  [facility] [address]  [place], [place], [address] Phone: [phone]   Fax:  [phone]  Name: Vincent Walker MRN: [id number] Date of Birth: [birth year]

## 2021-03-09 ENCOUNTER — Ambulatory Visit: Payer: Medicare Other | Admitting: Physical Therapy

## 2021-03-09 ENCOUNTER — Other Ambulatory Visit: Payer: Self-pay

## 2021-03-09 ENCOUNTER — Encounter: Payer: Self-pay | Admitting: Physical Therapy

## 2021-03-09 DIAGNOSIS — R2689 Other abnormalities of gait and mobility: Secondary | ICD-10-CM | POA: Diagnosis not present

## 2021-03-09 DIAGNOSIS — M6281 Muscle weakness (generalized): Secondary | ICD-10-CM

## 2021-03-09 DIAGNOSIS — R2681 Unsteadiness on feet: Secondary | ICD-10-CM | POA: Diagnosis not present

## 2021-03-09 NOTE — Therapy (Signed)
[facility] [address]  [place], [place], [address] Phone: [phone]   Fax:  [phone]  Physical Therapy Treatment  Patient Details  Name: Vincent Walker MRN: [id number] Date of Birth: [birth year] Referring Provider (PT): Alonza Bogus, MD   Encounter Date: 03/09/2021   PT End of Session - 03/09/21 1404     Visit Number 3    Number of Visits 12    Date for PT Re-Evaluation 04/10/21    Authorization Type Medicare & Federal BCBS    PT Start Time 0973    PT Stop Time 5329    PT Time Calculation (min) 39 min    Activity Tolerance Patient tolerated treatment well    Behavior During Therapy [facility] for tasks assessed/performed             Past Medical History:  Diagnosis Date   Adenomatous polyp of ascending colon 10/09/2018   Allergies 07/07/2018   Arthritis    Benign essential hypertension 02/08/2015   Chronic renal insufficiency 02/14/2015   Constipation 12/23/2015   Dyslipidemia 02/08/2015   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 Required nasal packing a couple of days ago.  Had a similar episode of epistaxis a little over a year ago.  At that visit, I could not see the exact bleeding spot. EXAMINATION after packing removal reveals several excoriated areas anteriorly but I was unable to see t   Glaucoma    History of blood transfusion 2010   After Kidney surgery   History of chicken pox    History of renal cell carcinoma 02/08/2015   diagnosed and removed in 2010 Right Monitored by Dr Tresa Endo of urology at [facility] Dr Richarda Overlie, nephrology at [facility]   Hyperglycemia 12/23/2015   Hyperlipidemia    Increased thyroid stimulating hormone (TSH) level 06/07/2017   Ingrown left big toenail 02/14/2015   Left foot pain 02/14/2015   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    MVP (mitral valve prolapse) 05/14/2016   Parkinson's disease 12/23/2015   Right hip pain 12/12/2016   Thrombocytopenia 02/08/2015   Tremor of right hand 02/14/2015    Past Surgical History:  Procedure Laterality Date   APPENDECTOMY  1995   CATARACT EXTRACTION, BILATERAL     COLONOSCOPY     COLONOSCOPY WITH PROPOFOL N/A 12/17/2018   Procedure: COLONOSCOPY WITH PROPOFOL;  Surgeon: Irving Copas., MD;  Location: [place];  Service: Gastroenterology;  Laterality: N/A;   ENDOSCOPIC MUCOSAL RESECTION N/A 12/17/2018   Procedure: ENDOSCOPIC MUCOSAL RESECTION;  Surgeon: Rush Landmark Telford Nab., MD;  Location: [place];  Service: Gastroenterology;  Laterality: N/A;   HEMOSTASIS CLIP PLACEMENT  12/17/2018   Procedure: HEMOSTASIS CLIP PLACEMENT;  Surgeon: Irving Copas., MD;  Location: [place];  Service: Gastroenterology;;   left knee scope  2003   POLYPECTOMY  12/17/2018   Procedure: POLYPECTOMY;  Surgeon: Rush Landmark Telford Nab., MD;  Location: [place];  Service: Gastroenterology;;   right knee scope  [place] INJECTION  12/17/2018   Procedure: SUBMUCOSAL LIFTING INJECTION;  Surgeon: Irving Copas., MD;  Location: [place];  Service: Gastroenterology;;   TOE SURGERY Left    metal 2nd toe- and top of foot- straighten bone   TONSILLECTOMY     TOTAL KNEE ARTHROPLASTY Left 07/06/2014   TOTAL NEPHRECTOMY Right     There were no  vitals filed for this visit.   Subjective Assessment - 03/09/21 1407     Subjective Pt reports no issues with the HEP stretches issued last visit but did have a little difficulty with the seated PWR! Moves.    Patient Stated Goals "To get some stamina and strength back in hips and knees."    Currently in Pain? No/denies                               [facility] Adult PT Treatment/Exercise - 03/09/21 1404       Lumbar Exercises: Stretches   Lower Trunk Rotation Limitations 10 x 5"       Lumbar Exercises: Supine   Bridge 10 reps;5 seconds      Knee/Hip Exercises: Aerobic   Nustep L4 x 6 min (UE/LE to promote reciprocal motion)               PWR [facility]) - 03/09/21 1404     PWR! exercises Moves in sitting;Moves in standing    PWR! Up 2x10    PWR! Rock 2x10    PWR! Twist 2x10    PWR Step 2x10    PWR! Up x10    PWR! Rock x10   cues for wt shift with opposite UE/LE extension as well as cues for eyes to follow extended hand   PWR! Twist x10   cues for return to start position with arms wide before turning to opposite side   PWR! Step x10   cues for rock and wt shift to increase leg elevation with lateral step                 PT Education - 03/09/21 1440     Education Details Standing PWR! Moves; info on email list for Power over Parkinson's    Person(s) Educated Patient    Methods Explanation;Demonstration;Verbal cues;Handout    Comprehension Verbalized understanding;Verbal cues required;Returned demonstration;Need further instruction              PT Short Term Goals - 03/07/21 1407       PT SHORT TERM GOAL #1   Title Independent with initial HEP to address LE flexibility and strengthening    Status On-going    Target Date 03/20/21      PT SHORT TERM GOAL #2   Title Patient will verbalize understanding of techniques to reduce freezing of gait episodes    Status On-going    Target Date 03/27/21               PT Long Term Goals - 03/07/21 1407       PT LONG TERM GOAL #1   Title Patient will be independent with ongoing/advanced HEP for self-management at home incorporating PWR! Moves as indicated    Status On-going    Target Date 04/10/21      PT LONG TERM GOAL #2   Title Patient will demonstrate improved B hip, knee and ankle strength to >/= 4+/5 for improved stability and ease of mobility    Status On-going    Target Date 04/10/21      PT LONG TERM GOAL #3   Title Patient will demonstrate increased gait speed to >/= 3.5 ft/sec  with decreased frequency of freezing of gait episodes to reduce risk for falls with ambulation    Status On-going    Target Date 04/10/21      PT LONG TERM GOAL #4  Title Patient will improve Berg score to >/= 52/56 to improve safety and stability with ADLs in standing and reduce risk for falls    Status On-going    Target Date 04/10/21      PT LONG TERM GOAL #5   Title Patient will improve FGA to >/= 25/30 to improve gait stability and reduce risk for falls    Status On-going    Target Date 04/10/21                   Plan - 03/09/21 1443     Clinical Impression Statement Burnis reports no issues with initial HEP stretches and denies need for review today, but requests review/clarification of the seated PWR! Moves. Reviewed seated PWR! Moves with no issues noted with PWR! Up but minor clarifications necessary with the remaining moves. Initiated PWR! Moves in standing with PT guiding first set but pt encouraged to set pace and keep count with second set. Remainder of time focused on core flexibility and strength with hooklying LTR and bridges - good tolerance for exercises but cues necessary for pacing. Education provided on email list for [employer] over [employer] and PD related resources with most recent email forwarded to patient's wife at his request and email sent to program coordinator to have patient added to email list.    Comorbidities Parkinson's disease, mild neurocognitive disorder due to PD, HTN, MVP, L TKA, R knee scope, CRI, h/o renal cancer with R nephrectomy, thrombocytopenia, dyslipidemia, B macular degeneration    Rehab Potential Good    PT Frequency 2x / week    PT Duration 6 weeks    PT Treatment/Interventions ADLs/Self Care Home Management;DME Instruction;Gait training;Stair training;Functional mobility training;Therapeutic activities;Therapeutic exercise;Balance training;Neuromuscular re-education;Patient/family education;Manual techniques;Dry  needling;Passive range of motion    PT Next Visit Plan Review seated & standing PWR! Moves and progress to other positions as tolerated; core/lumbopelvic flexibilty and strengthening to promote upright posture; review HEP & update/modify as indicated    PT Home Exercise Plan Access Code: [email or web address] (12/6);  PWR! Moves: Seated (12/6); Standing (12/8)    Consulted and Agree with Plan of Care Patient             Patient will benefit from skilled therapeutic intervention in order to improve the following deficits and impairments:  Abnormal gait, Decreased activity tolerance, Decreased balance, Decreased coordination, Decreased endurance, Decreased knowledge of precautions, Decreased knowledge of use of DME, Decreased mobility, Decreased strength, Difficulty walking, Impaired perceived functional ability, Impaired flexibility, Improper body mechanics, Postural dysfunction  Visit Diagnosis: Other abnormalities of gait and mobility  Unsteadiness on feet  Muscle weakness (generalized)     Problem List Patient Active Problem List   Diagnosis Date Noted   Eustachian tube dysfunction, left 02/21/2021   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   Early stage nonexudative age-related macular degeneration of both eyes 03/09/2019   Adenomatous polyp of ascending colon 10/09/2018   Epistaxis 07/07/2018   Allergies 07/07/2018   Obesity 06/15/2018   Increased thyroid stimulating hormone (TSH) level 06/07/2017   Right hip pain 12/12/2016   MVP (mitral valve prolapse) 05/14/2016   Parkinson's disease 12/23/2015   Constipation 12/23/2015   Hyperglycemia 12/23/2015   Tremor of right hand 02/14/2015   Left foot pain 02/14/2015   Chronic renal insufficiency 02/14/2015   Benign essential hypertension 02/08/2015   Dyslipidemia 02/08/2015   History of renal cell carcinoma 02/08/2015   Thrombocytopenia 02/08/2015   History of chicken pox  Percival Spanish, PT 03/09/2021,  7:14 PM  [facility] [address]  [place], [place], [address] Phone: [phone]   Fax:  [phone]  Name: ADELL PANEK MRN: [id number] Date of Birth: 10-22-1945

## 2021-03-13 ENCOUNTER — Ambulatory Visit: Payer: Medicare Other | Admitting: Physical Therapy

## 2021-03-13 ENCOUNTER — Encounter: Payer: Self-pay | Admitting: Physical Therapy

## 2021-03-13 ENCOUNTER — Other Ambulatory Visit: Payer: Self-pay

## 2021-03-13 DIAGNOSIS — M6281 Muscle weakness (generalized): Secondary | ICD-10-CM

## 2021-03-13 DIAGNOSIS — R2689 Other abnormalities of gait and mobility: Secondary | ICD-10-CM

## 2021-03-13 DIAGNOSIS — R2681 Unsteadiness on feet: Secondary | ICD-10-CM

## 2021-03-13 NOTE — Therapy (Signed)
[facility] [address]  [place], [place], [address] Phone: [phone]   Fax:  [phone]  Physical Therapy Treatment  Patient Details  Name: Vincent Walker MRN: [id number] Date of Birth: 02/23/1946 Referring Provider (PT): Alonza Bogus, MD   Encounter Date: 03/13/2021   PT End of Session - 03/13/21 1402     Visit Number 4    Number of Visits 12    Date for PT Re-Evaluation 04/10/21    Authorization Type Medicare & Federal BCBS    PT Start Time 8938    PT Stop Time 1444    PT Time Calculation (min) 42 min    Activity Tolerance Patient tolerated treatment well    Behavior During Therapy [facility] for tasks assessed/performed             Past Medical History:  Diagnosis Date   Adenomatous polyp of ascending colon 10/09/2018   Allergies 07/07/2018   Arthritis    Benign essential hypertension 02/08/2015   Chronic renal insufficiency 02/14/2015   Constipation 12/23/2015   Dyslipidemia 02/08/2015   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 Required nasal packing a couple of days ago.  Had a similar episode of epistaxis a little over a year ago.  At that visit, I could not see the exact bleeding spot. EXAMINATION after packing removal reveals several excoriated areas anteriorly but I was unable to see t   Glaucoma    History of blood transfusion 2010   After Kidney surgery   History of chicken pox    History of renal cell carcinoma 02/08/2015   diagnosed and removed in 2010 Right Monitored by Dr Tresa Endo of [facility] at [facility] Dr Richarda Overlie, nephrology at [facility]   Hyperglycemia 12/23/2015   Hyperlipidemia    Increased thyroid stimulating hormone (TSH) level 06/07/2017   Ingrown left big toenail 02/14/2015   Left foot pain 02/14/2015   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, MD;  Location: [place];  Service: Gastroenterology;  Laterality: N/A;   HEMOSTASIS CLIP PLACEMENT  12/17/2018   Procedure: HEMOSTASIS CLIP PLACEMENT;  Surgeon: Irving Copas., MD;  Location: [place];  Service: Gastroenterology;;   left knee scope  2003   POLYPECTOMY  12/17/2018   Procedure: POLYPECTOMY;  Surgeon: Rush Landmark Telford Nab., MD;  Location: [place];  Service: Gastroenterology;;   right knee scope  [place] INJECTION  12/17/2018   Procedure: SUBMUCOSAL LIFTING INJECTION;  Surgeon: Irving Copas., MD;  Location: [place];  Service: Gastroenterology;;   TOE SURGERY Left    metal 2nd toe- and top of foot- straighten bone   TONSILLECTOMY     TOTAL KNEE ARTHROPLASTY Left 07/06/2014   TOTAL NEPHRECTOMY Right     There were no  vitals filed for this visit.   Subjective Assessment - 03/13/21 1408     Subjective Pt reports he is still having some difficulty remembering the PWR! Moves.    Patient Stated Goals "To get some stamina and strength back in hips and knees."    Currently in Pain? No/denies                               [facility] Adult PT Treatment/Exercise - 03/13/21 1402       Lumbar Exercises: Standing   Row Both;15 reps;Strengthening;Theraband    Theraband Level (Row) Level 3 (Green)    Row  Limitations cues for abd bracing & scap retraction for good upright posture    Shoulder Extension Both;15 reps;Strengthening;Theraband    Theraband Level (Shoulder Extension) Level 3 (Green)    Shoulder Extension Limitations cues for abd bracing & scap retraction for good upright posture    Other Standing Lumbar Exercises B trunk rotation with green TB 2 x 10      Knee/Hip Exercises: Aerobic   Nustep L5 x 6 min (UE/LE to promote reciprocal motion)               PWR [facility] - 03/13/21 1402     PWR! exercises Moves in supine;Functional moves    PWR! Up x10    PWR! Rock x10    PWR! Twist x10   cues to return to arms to neutral position before moving to opp side   PWR! Step x10   repeated cues for order and sequencing of step pattern   PWR! Sit to Stand x10   cues for fwd wt shift of torso to increase fwd momentum with sit to stand and improve control of descent upon return to sit                   PT Short Term Goals - 03/07/21 1407       PT SHORT TERM GOAL #1   Title Independent with initial HEP to address LE flexibility and strengthening    Status On-going    Target Date 03/20/21      PT SHORT TERM GOAL #2   Title Patient will verbalize understanding of techniques to reduce freezing of gait episodes    Status On-going    Target Date 03/27/21               PT Long Term Goals - 03/07/21 1407       PT LONG TERM GOAL #1   Title Patient will be independent with ongoing/advanced HEP for self-management at home incorporating PWR! Moves as indicated    Status On-going    Target Date 04/10/21      PT LONG TERM GOAL #2   Title Patient will demonstrate improved B hip, knee and ankle strength to >/= 4+/5 for improved stability and ease of mobility    Status On-going    Target Date 04/10/21      PT LONG TERM GOAL #3   Title Patient will demonstrate increased gait speed to >/= 3.5 ft/sec with decreased frequency of freezing of gait episodes to reduce risk for  falls with ambulation    Status On-going    Target Date 04/10/21      PT LONG TERM GOAL #4   Title Patient will improve Berg score to >/= 52/56 to improve safety and stability with ADLs in standing and reduce risk for falls  Status On-going    Target Date 04/10/21      PT LONG TERM GOAL #5   Title Patient will improve FGA to >/= 25/30 to improve gait stability and reduce risk for falls    Status On-going    Target Date 04/10/21                   Plan - 03/13/21 1421     Clinical Impression Statement Vincent Walker reports he is still working on remembering the [employer]! Moves but declined need to review seated or standing moves today, preferring to try new exercises. Worked initially on core and postural strengthening in standing with green TB resistance incorporating some of movement pattern from standing PWR! Moves with good tolerance but cues necessary to better incorporate trunk motion and control especially with rotation. Session concluded with introduction of supine PWR! Moves as well further carryover of seated and standing PWR! Moves into Vincent Walker with PWR! Sit to Stand. Vincent Walker having increased difficulty with coordination of supine PWR! Moves but once he picked up the rhythm, he was able to be more consistent with PWR! Sit to Stand.    Comorbidities Parkinson's disease, mild neurocognitive disorder due to PD, HTN, MVP, L TKA, R knee scope, CRI, h/o renal cancer with R nephrectomy, thrombocytopenia, dyslipidemia, B macular degeneration    Rehab Potential Good    PT Frequency 2x / week    PT Duration 6 weeks    PT Treatment/Interventions ADLs/Self Care Home Management;DME Instruction;Gait training;Stair training;Functional mobility training;Therapeutic activities;Therapeutic exercise;Balance training;Neuromuscular re-education;Patient/family education;Manual techniques;Dry needling;Passive range of motion    PT Next Visit Plan Review supine, seated & standing PWR! Moves and progress  to other positions as tolerated; core/lumbopelvic flexibilty and strengthening to promote upright posture; review HEP & update/modify as indicated    PT Home Exercise Plan Access Code: [email or web address] (12/6);  PWR! Moves: Seated (12/6); Standing (12/8)    Consulted and Agree with Plan of Care Patient             Patient will benefit from skilled therapeutic intervention in order to improve the following deficits and impairments:  Abnormal gait, Decreased activity tolerance, Decreased balance, Decreased coordination, Decreased endurance, Decreased knowledge of precautions, Decreased knowledge of use of DME, Decreased mobility, Decreased strength, Difficulty walking, Impaired perceived functional ability, Impaired flexibility, Improper body mechanics, Postural dysfunction  Visit Diagnosis: Other abnormalities of gait and mobility  Unsteadiness on feet  Muscle weakness (generalized)     Problem List Patient Active Problem List   Diagnosis Date Noted   Eustachian tube dysfunction, left 02/21/2021   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   Early stage nonexudative age-related macular degeneration of both eyes 03/09/2019   Adenomatous polyp of ascending colon 10/09/2018   Epistaxis 07/07/2018   Allergies 07/07/2018   Obesity 06/15/2018   Increased thyroid stimulating hormone (TSH) level 06/07/2017   Right hip pain 12/12/2016   MVP (mitral valve prolapse) 05/14/2016   Parkinson's disease 12/23/2015   Constipation 12/23/2015   Hyperglycemia 12/23/2015   Tremor of right hand 02/14/2015   Left foot pain 02/14/2015   Chronic renal insufficiency 02/14/2015   Benign essential hypertension 02/08/2015   Dyslipidemia 02/08/2015   History of renal cell carcinoma 02/08/2015   Thrombocytopenia 02/08/2015   History of chicken pox     Percival Spanish, PT 03/13/2021, 5:51 PM  [facility] [address]  [place], [place], [address] Phone: [phone]  Fax:  [phone]  Name: Vincent Walker MRN: [id number] Date of Birth: [birth year]

## 2021-03-16 ENCOUNTER — Other Ambulatory Visit: Payer: Self-pay

## 2021-03-16 ENCOUNTER — Ambulatory Visit: Payer: Medicare Other | Admitting: Physical Therapy

## 2021-03-16 ENCOUNTER — Encounter: Payer: Self-pay | Admitting: Physical Therapy

## 2021-03-16 DIAGNOSIS — R2689 Other abnormalities of gait and mobility: Secondary | ICD-10-CM

## 2021-03-16 DIAGNOSIS — R2681 Unsteadiness on feet: Secondary | ICD-10-CM | POA: Diagnosis not present

## 2021-03-16 DIAGNOSIS — M6281 Muscle weakness (generalized): Secondary | ICD-10-CM | POA: Diagnosis not present

## 2021-03-16 NOTE — Therapy (Signed)
[facility] [address]  [place], [place], [address] Phone: [phone]   Fax:  [phone]  Physical Therapy Treatment  Patient Details  Name: Vincent Walker MRN: [id number] Date of Birth: May 27, 1945 Referring Provider (PT): Alonza Bogus, MD   Encounter Date: 03/16/2021   PT End of Session - 03/16/21 1356     Visit Number 5    Number of Visits 12    Date for PT Re-Evaluation 04/10/21    Authorization Type Medicare & Federal BCBS    PT Start Time 6333    PT Stop Time 1443    PT Time Calculation (min) 47 min    Activity Tolerance Patient tolerated treatment well    Behavior During Therapy [facility] for tasks assessed/performed             Past Medical History:  Diagnosis Date   Adenomatous polyp of ascending colon 10/09/2018   Allergies 07/07/2018   Arthritis    Benign essential hypertension 02/08/2015   Chronic renal insufficiency 02/14/2015   Constipation 12/23/2015   Dyslipidemia 02/08/2015   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 Required nasal packing a couple of days ago.  Had a similar episode of epistaxis a little over a year ago.  At that visit, I could not see the exact bleeding spot. EXAMINATION after packing removal reveals several excoriated areas anteriorly Vincent I was unable to see t   Glaucoma    History of blood transfusion 2010   After Kidney surgery   History of chicken pox    History of renal cell carcinoma 02/08/2015   diagnosed and removed in 2010 Right Monitored by Dr Tresa Endo of [facility]rology at [facility] Dr Richarda Overlie, nephrology at [facility]   Hyperglycemia 12/23/2015   Hyperlipidemia    Increased thyroid stimulating hormone (TSH) level 06/07/2017   Ingrown left big toenail 02/14/2015   Left foot pain 02/14/2015   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    MVP (mitral valve prolapse) 05/14/2016   Parkinson's disease 12/23/2015   Right hip pain 12/12/2016   Thrombocytopenia 02/08/2015   Tremor of right hand 02/14/2015    Past Surgical History:  Procedure Laterality Date   APPENDECTOMY  1995   CATARACT EXTRACTION, BILATERAL     COLONOSCOPY     COLONOSCOPY WITH PROPOFOL N/A 12/17/2018   Procedure: COLONOSCOPY WITH PROPOFOL;  Surgeon: Irving Copas., MD;  Location: [place];  Service: Gastroenterology;  Laterality: N/A;   ENDOSCOPIC MUCOSAL RESECTION N/A 12/17/2018   Procedure: ENDOSCOPIC MUCOSAL RESECTION;  Surgeon: Rush Landmark Telford Nab., MD;  Location: [place];  Service: Gastroenterology;  Laterality: N/A;   HEMOSTASIS CLIP PLACEMENT  12/17/2018   Procedure: HEMOSTASIS CLIP PLACEMENT;  Surgeon: Irving Copas., MD;  Location: [place];  Service: Gastroenterology;;   left knee scope  2003   POLYPECTOMY  12/17/2018   Procedure: POLYPECTOMY;  Surgeon: Rush Landmark Telford Nab., MD;  Location: [place];  Service: Gastroenterology;;   right knee scope  [place] INJECTION  12/17/2018   Procedure: SUBMUCOSAL LIFTING INJECTION;  Surgeon: Irving Copas., MD;  Location: [place];  Service: Gastroenterology;;   TOE SURGERY Left    metal 2nd toe- and top of foot- straighten bone   TONSILLECTOMY     TOTAL KNEE ARTHROPLASTY Left 07/06/2014   TOTAL NEPHRECTOMY Right     There were no  vitals filed for this visit.   Subjective Assessment - 03/16/21 1400     Subjective Pt denies any issues today - "if I was any better, I'd have to take medication for it".    Patient Stated Goals "To get some stamina and strength back in hips and knees."                               [facility] Adult PT Treatment/Exercise - 03/16/21 1356       Lumbar Exercises: Stretches   Hip Flexor Stretch Right;Left;2 reps;30 seconds    Hip Flexor Stretch Limitations mod thomas with strap     Piriformis Stretch Right;Left;2 reps;30 seconds    Piriformis Stretch Limitations hooklying KTOS    Figure 4 Stretch 2 reps;30 seconds;Supine;With overpressure      Knee/Hip Exercises: Aerobic   Nustep L5 x 6 min (UE/LE to promote reciprocal motion)               PWR [facility]) - 03/16/21 1356     PWR! exercises Walker in sitting;Walker in standing;Walker in supine    PWR! Up x10    PWR! Rock x10    PWR! Twist x10    PWR! Step x10    PWR! Up x10    PWR! Rock x10    PWR! Twist x10    PWR Step x10    PWR! Up x10    PWR! Rock x10    PWR! Twist x10    PWR! Step x10                  PT Education - 03/16/21 1440     Education Details Supine PWR! Walker    Person(s) Educated Patient    Methods Explanation;Demonstration;Verbal cues;Handout    Comprehension Verbalized understanding;Verbal cues required;Returned demonstration;Need further instruction              PT Short Term Goals - 03/16/21 1402       PT SHORT TERM GOAL #1   Title Independent with initial HEP to address LE flexibility and strengthening    Status Achieved   03/16/21     PT SHORT TERM GOAL #2   Title Patient will verbalize understanding of techniques to reduce freezing of gait episodes    Status On-Walker   03/16/21 - info provied today   Target Date 03/27/21               PT Long Term Goals - 03/07/21 1407       PT LONG TERM GOAL #1   Title Patient will be independent with ongoing/advanced HEP for self-management at home incorporating PWR! Walker as indicated    Status On-Walker    Target Date 04/10/21      PT LONG TERM GOAL #2   Title Patient will demonstrate improved B hip, knee and ankle strength to >/= 4+/5 for improved stability and ease of mobility    Status On-Walker    Target Date 04/10/21      PT LONG TERM GOAL #3   Title Patient will demonstrate increased gait speed to >/= 3.5 ft/sec with decreased frequency of freezing of gait episodes to reduce risk for falls with  ambulation    Status On-Walker    Target Date 04/10/21      PT LONG TERM GOAL #4   Title Patient will improve Berg score to >/= 52/56 to improve safety and stability with ADLs in  standing and reduce risk for falls    Status On-Walker    Target Date 04/10/21      PT LONG TERM GOAL #5   Title Patient will improve FGA to >/= 25/30 to improve gait stability and reduce risk for falls    Status On-Walker    Target Date 04/10/21                   Plan - 03/16/21 1443     Clinical Impression Statement Vincent Walker reports HEP Walker well Vincent did require some minor clarification during review of HEP stretches for positioning - good return demonstration following review - STG #1 met. He notes freezing episodes are usually only an issue for him in the morning upon getting out of bed Vincent dont usually bother him once he is up and moving. Provided education on strategies to reduce freezing episodes. Vincent Walker, Vincent Walker, Vincent seem to click. Reviewed the 3 positions that we have completed thus far, with pt able to provide good return demonstration after initial cueing and provided handout for home for the supine PWR! Walker.    Comorbidities Parkinsons disease, mild neurocognitive disorder due to PD, HTN, MVP, L TKA, R knee scope, CRI, h/o renal cancer with R nephrectomy, thrombocytopenia, dyslipidemia, B macular degeneration    Rehab Potential Good    PT Frequency 2x / week    PT Duration 6 weeks    PT Treatment/Interventions ADLs/Self Care Home Management;DME Instruction;Gait training;Stair training;Functional mobility training;Therapeutic activities;Therapeutic exercise;Balance training;Neuromuscular re-education;Patient/family education;Manual techniques;Dry needling;Passive range of motion    PT Next Visit Plan Review supine, seated & standing PWR! Walker and progress to other positions as tolerated;  core/lumbopelvic flexibilty and strengthening to promote upright posture; review HEP & update/modify as indicated    PT Home Exercise Plan Access Code: [email or web address] (12/6);  PWR! Walker: Seated (12/6); Standing (12/8), Supine (12/15)    Consulted and Agree with Plan of Care Patient             Patient will benefit from skilled therapeutic intervention in order to improve the following deficits and impairments:  Abnormal gait, Decreased activity tolerance, Decreased balance, Decreased coordination, Decreased endurance, Decreased knowledge of precautions, Decreased knowledge of use of DME, Decreased mobility, Decreased strength, Difficulty walking, Impaired perceived functional ability, Impaired flexibility, Improper body mechanics, Postural dysfunction  Visit Diagnosis: Other abnormalities of gait and mobility  Unsteadiness on feet  Muscle weakness (generalized)     Problem List Patient Active Problem List   Diagnosis Date Noted   Eustachian tube dysfunction, left 02/21/2021   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   Early stage nonexudative age-related macular degeneration of both eyes 03/09/2019   Adenomatous polyp of ascending colon 10/09/2018   Epistaxis 07/07/2018   Allergies 07/07/2018   Obesity 06/15/2018   Increased thyroid stimulating hormone (TSH) level 06/07/2017   Right hip pain 12/12/2016   MVP (mitral valve prolapse) 05/14/2016   Parkinson's disease 12/23/2015   Constipation 12/23/2015   Hyperglycemia 12/23/2015   Tremor of right hand 02/14/2015   Left foot pain 02/14/2015   Chronic renal insufficiency 02/14/2015   Benign essential hypertension 02/08/2015   Dyslipidemia 02/08/2015   History of renal cell carcinoma 02/08/2015   Thrombocytopenia 02/08/2015   History of chicken pox     Percival Spanish, PT 03/16/2021, 3:58 PM  [facility] [address]  [place],  [place], [address] Phone: [phone]   Fax:  [phone]  Name: MYLO CHOI MRN: [id number] Date of Birth: 12-Jan-1946

## 2021-03-21 ENCOUNTER — Ambulatory Visit: Payer: Medicare Other | Admitting: Physical Therapy

## 2021-03-21 ENCOUNTER — Other Ambulatory Visit: Payer: Self-pay

## 2021-03-21 ENCOUNTER — Other Ambulatory Visit (HOSPITAL_BASED_OUTPATIENT_CLINIC_OR_DEPARTMENT_OTHER): Payer: Self-pay

## 2021-03-21 ENCOUNTER — Encounter: Payer: Self-pay | Admitting: Physical Therapy

## 2021-03-21 DIAGNOSIS — M6281 Muscle weakness (generalized): Secondary | ICD-10-CM

## 2021-03-21 DIAGNOSIS — R2689 Other abnormalities of gait and mobility: Secondary | ICD-10-CM | POA: Diagnosis not present

## 2021-03-21 DIAGNOSIS — R2681 Unsteadiness on feet: Secondary | ICD-10-CM

## 2021-03-21 NOTE — Therapy (Signed)
[facility] [address]  [place], [place], [address] Phone: [phone]   Fax:  [phone]  Physical Therapy Treatment  Patient Details  Name: Vincent Walker MRN: [id number] Date of Birth: March 13, 1946 Referring Provider (PT): Alonza Bogus, MD   Encounter Date: 03/21/2021   PT End of Session - 03/21/21 1400     Visit Number 6    Number of Visits 12    Date for PT Re-Evaluation 04/10/21    Authorization Type Medicare & Federal BCBS    PT Start Time 1400    PT Stop Time 7673    PT Time Calculation (min) 42 min    Activity Tolerance Patient tolerated treatment well    Behavior During Therapy [facility] for tasks assessed/performed             Past Medical History:  Diagnosis Date   Adenomatous polyp of ascending colon 10/09/2018   Allergies 07/07/2018   Arthritis    Benign essential hypertension 02/08/2015   Chronic renal insufficiency 02/14/2015   Constipation 12/23/2015   Dyslipidemia 02/08/2015   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 Required nasal packing a couple of days ago.  Had a similar episode of epistaxis a little over a year ago.  At that visit, I could not see the exact bleeding spot. EXAMINATION after packing removal reveals several excoriated areas anteriorly but I was unable to see t   Glaucoma    History of blood transfusion 2010   After Kidney surgery   History of chicken pox    History of renal cell carcinoma 02/08/2015   diagnosed and removed in 2010 Right Monitored by Dr Tresa Endo of urology at [facility] Dr Richarda Overlie, nephrology at [facility]   Hyperglycemia 12/23/2015   Hyperlipidemia    Increased thyroid stimulating hormone (TSH) level 06/07/2017   Ingrown left big toenail 02/14/2015   Left foot pain 02/14/2015   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    MVP (mitral valve prolapse) 05/14/2016   Parkinson's disease 12/23/2015   Right hip pain 12/12/2016   Thrombocytopenia 02/08/2015   Tremor of right hand 02/14/2015    Past Surgical History:  Procedure Laterality Date   APPENDECTOMY  1995   CATARACT EXTRACTION, BILATERAL     COLONOSCOPY     COLONOSCOPY WITH PROPOFOL N/A 12/17/2018   Procedure: COLONOSCOPY WITH PROPOFOL;  Surgeon: Irving Copas., MD;  Location: [place];  Service: Gastroenterology;  Laterality: N/A;   ENDOSCOPIC MUCOSAL RESECTION N/A 12/17/2018   Procedure: ENDOSCOPIC MUCOSAL RESECTION;  Surgeon: Rush Landmark Telford Nab., MD;  Location: [place];  Service: Gastroenterology;  Laterality: N/A;   HEMOSTASIS CLIP PLACEMENT  12/17/2018   Procedure: HEMOSTASIS CLIP PLACEMENT;  Surgeon: Irving Copas., MD;  Location: [place];  Service: Gastroenterology;;   left knee scope  2003   POLYPECTOMY  12/17/2018   Procedure: POLYPECTOMY;  Surgeon: Rush Landmark Telford Nab., MD;  Location: [place];  Service: Gastroenterology;;   right knee scope  [place] INJECTION  12/17/2018   Procedure: SUBMUCOSAL LIFTING INJECTION;  Surgeon: Irving Copas., MD;  Location: [place];  Service: Gastroenterology;;   TOE SURGERY Left    metal 2nd toe- and top of foot- straighten bone   TONSILLECTOMY     TOTAL KNEE ARTHROPLASTY Left 07/06/2014   TOTAL NEPHRECTOMY Right     There were no  vitals filed for this visit.   Subjective Assessment - 03/21/21 1405     Subjective Pt reports he irritated his back attempted to pour a 50# bag of dogfood into a plastic storage container when the bag shifted causing him to strain his back while reaching to catch the bag. The exercises seem to help his back but still notes some soreness with certain movements.    Patient Stated Goals "To get some stamina and strength back in hips and knees."    Currently in Pain? No/denies    Pain Score 0-No pain   up 1/75  with certain movements   Pain Location Back    Pain Orientation Right;Lower    Pain Descriptors / Indicators Sharp    Pain Type Acute pain;Chronic pain    Pain Onset In the past 7 days    Pain Frequency Intermittent    Aggravating Factors  twisting motions                               [facility] Adult PT Treatment/Exercise - 03/21/21 1400       Lumbar Exercises: Stretches   Lower Trunk Rotation Limitations 10 x 10"    Other Lumbar Stretch Exercise Seated 3-way prayer stretch/child's pose with green Pbal 3 x 30 sec (2 sets)      Knee/Hip Exercises: Aerobic   Nustep L5 x 6 min (UE/LE to promote reciprocal motion)               PWR ([facility]) - 03/21/21 1400     PWR! exercises Moves in [place]! Up x10    PWR! Rock x10    PWR! Twist x10    PWR! Step deferred at pt request d/t feeling of imbalance    PWR! Up x10    PWR! Rock x10    PWR! Twist x10    PWR Step x10                  PT Education - 03/21/21 1441     Education Details HEP update - lumbar ROM/stretches - Access Code: [email or web address]    Person(s) Educated Patient    Methods Explanation;Demonstration;Verbal cues;Handout    Comprehension Verbalized understanding;Verbal cues required;Returned demonstration;Need further instruction              PT Short Term Goals - 03/16/21 1402       PT SHORT TERM GOAL #1   Title Independent with initial HEP to address LE flexibility and strengthening    Status Achieved   03/16/21     PT SHORT TERM GOAL #2   Title Patient will verbalize understanding of techniques to reduce freezing of gait episodes    Status On-going   03/16/21 - info provied today   Target Date 03/27/21               PT Long Term Goals - 03/07/21 1407       PT LONG TERM GOAL #1   Title Patient will be independent with ongoing/advanced HEP for self-management at home incorporating PWR! Moves as indicated    Status On-going    Target Date 04/10/21      PT LONG  TERM GOAL #2   Title Patient will demonstrate improved B hip, knee and ankle strength to >/= 4+/5 for improved stability and ease of mobility    Status On-going    Target Date 04/10/21  PT LONG TERM GOAL #3   Title Patient will demonstrate increased gait speed to >/= 3.5 ft/sec with decreased frequency of freezing of gait episodes to reduce risk for falls with ambulation    Status On-going    Target Date 04/10/21      PT LONG TERM GOAL #4   Title Patient will improve Berg score to >/= 52/56 to improve safety and stability with ADLs in standing and reduce risk for falls    Status On-going    Target Date 04/10/21      PT LONG TERM GOAL #5   Title Patient will improve FGA to >/= 25/30 to improve gait stability and reduce risk for falls    Status On-going    Target Date 04/10/21                   Plan - 03/21/21 1442     Clinical Impression Statement Vincent Walker reports he strained his back attempting to empty a bag of dog food into a storage container and notes some ongoing intermittent sharp pain in R low back, usually with twisting motions. Initially focused on some gentle lumbar stretching and mobilization to help reduce muscle guarding and spasm with pt noting good tolerance - HEP instructions provided for lumbar ROM/stretching. Vincent Walker was able to attempt PWR! Moves in quadruped w/o exacerbation of his LBP but did request to defer PWR! Step in quadruped due to feeling of imbalance, therefore will hold off on adding quadruped PWR! Moves to HEP.    Comorbidities Parkinsons disease, mild neurocognitive disorder due to PD, HTN, MVP, L TKA, R knee scope, CRI, h/o renal cancer with R nephrectomy, thrombocytopenia, dyslipidemia, B macular degeneration    Rehab Potential Good    PT Frequency 2x / week    PT Duration 6 weeks    PT Treatment/Interventions ADLs/Self Care Home Management;DME Instruction;Gait training;Stair training;Functional mobility training;Therapeutic  activities;Therapeutic exercise;Balance training;Neuromuscular re-education;Patient/family education;Manual techniques;Dry needling;Passive range of motion    PT Next Visit Plan Review supine, seated, standing & quadruped PWR! Moves and progress to prone position as tolerated; core/lumbopelvic flexibilty and strengthening to promote upright posture and manage LBP; review HEP & update/modify as indicated    PT Home Exercise Plan Access Code: [email or web address] (12/6, updated 12/20);  PWR! Moves: Seated (12/6); Standing (12/8), Supine (12/15)    Consulted and Agree with Plan of Care Patient             Patient will benefit from skilled therapeutic intervention in order to improve the following deficits and impairments:  Abnormal gait, Decreased activity tolerance, Decreased balance, Decreased coordination, Decreased endurance, Decreased knowledge of precautions, Decreased knowledge of use of DME, Decreased mobility, Decreased strength, Difficulty walking, Impaired perceived functional ability, Impaired flexibility, Improper body mechanics, Postural dysfunction  Visit Diagnosis: Other abnormalities of gait and mobility  Unsteadiness on feet  Muscle weakness (generalized)     Problem List Patient Active Problem List   Diagnosis Date Noted   Eustachian tube dysfunction, left 02/21/2021   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   Early stage nonexudative age-related macular degeneration of both eyes 03/09/2019   Adenomatous polyp of ascending colon 10/09/2018   Epistaxis 07/07/2018   Allergies 07/07/2018   Obesity 06/15/2018   Increased thyroid stimulating hormone (TSH) level 06/07/2017   Right hip pain 12/12/2016   MVP (mitral valve prolapse) 05/14/2016   Parkinson's disease 12/23/2015   Constipation 12/23/2015   Hyperglycemia 12/23/2015   Tremor of right hand 02/14/2015  Left foot pain 02/14/2015   Chronic renal insufficiency 02/14/2015   Benign essential  hypertension 02/08/2015   Dyslipidemia 02/08/2015   History of renal cell carcinoma 02/08/2015   Thrombocytopenia 02/08/2015   History of chicken pox     Percival Spanish, PT 03/21/2021, 7:37 PM  [facility] [address]  [place], [place], [address] Phone: [phone]   Fax:  [phone]  Name: Vincent Walker MRN: [id number] Date of Birth: 22-Feb-1946

## 2021-03-21 NOTE — Patient Instructions (Signed)
° °  Access Code: [email or web address] URL: [email or web address] Date: 03/21/2021 Prepared by: Annie Paras  Exercises Supine Figure 4 Piriformis Stretch - 2-3 x daily - 7 x weekly - 3 reps - 30 sec hold Supine Piriformis Stretch with Foot on Ground - 2-3 x daily - 7 x weekly - 3 reps - 30 sec hold Hooklying Hamstring Stretch with Strap - 2-3 x daily - 7 x weekly - 3 reps - 30 sec hold Supine Quadriceps Stretch with Strap on Table - 2-3 x daily - 7 x weekly - 3 reps - 30 sec hold Supine Lower Trunk Rotation - 2-3 x daily - 7 x weekly - 10 reps - 10 sec hold Seated 3 Way Exercise Ball Roll Out Stretch - 2-3 x daily - 7 x weekly - 2 sets - 3 reps - 30 sec hold  Patient Education Tips to reduce freezing episodes with standing or walking

## 2021-03-23 ENCOUNTER — Other Ambulatory Visit (HOSPITAL_BASED_OUTPATIENT_CLINIC_OR_DEPARTMENT_OTHER): Payer: Self-pay

## 2021-03-28 ENCOUNTER — Ambulatory Visit: Payer: Medicare Other | Admitting: Physical Therapy

## 2021-03-30 ENCOUNTER — Other Ambulatory Visit: Payer: Self-pay

## 2021-03-30 ENCOUNTER — Ambulatory Visit: Payer: Medicare Other | Admitting: Physical Therapy

## 2021-03-30 ENCOUNTER — Encounter: Payer: Self-pay | Admitting: Physical Therapy

## 2021-03-30 DIAGNOSIS — M6281 Muscle weakness (generalized): Secondary | ICD-10-CM

## 2021-03-30 DIAGNOSIS — R2689 Other abnormalities of gait and mobility: Secondary | ICD-10-CM

## 2021-03-30 DIAGNOSIS — R2681 Unsteadiness on feet: Secondary | ICD-10-CM | POA: Diagnosis not present

## 2021-03-30 NOTE — Therapy (Signed)
[facility] [address]  [place], [place], [address] Phone: [phone]   Fax:  [phone]  Physical Therapy Treatment  Patient Details  Name: Vincent Walker MRN: [id number] Date of Birth: December 11, 1945 Referring Provider (PT): Alonza Bogus, MD   Encounter Date: 03/30/2021   PT End of Session - 03/30/21 1401     Visit Number 7    Number of Visits 12    Date for PT Re-Evaluation 04/10/21    Authorization Type Medicare & Federal BCBS    PT Start Time 1401    PT Stop Time 8416    PT Time Calculation (min) 42 min    Activity Tolerance Patient tolerated treatment well    Behavior During Therapy [facility] for tasks assessed/performed             Past Medical History:  Diagnosis Date   Adenomatous polyp of ascending colon 10/09/2018   Allergies 07/07/2018   Arthritis    Benign essential hypertension 02/08/2015   Chronic renal insufficiency 02/14/2015   Constipation 12/23/2015   Dyslipidemia 02/08/2015   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 Required nasal packing a couple of days ago.  Had a similar episode of epistaxis a little over a year ago.  At that visit, I could not see the exact bleeding spot. EXAMINATION after packing removal reveals several excoriated areas anteriorly but I was unable to see t   Glaucoma    History of blood transfusion 2010   After Kidney surgery   History of chicken pox    History of renal cell carcinoma 02/08/2015   diagnosed and removed in 2010 Right Monitored by Dr Tresa Endo of urology at [facility] Dr Richarda Overlie, nephrology at [facility]   Hyperglycemia 12/23/2015   Hyperlipidemia    Increased thyroid stimulating hormone (TSH) level 06/07/2017   Ingrown left big toenail 02/14/2015   Left foot pain 02/14/2015   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    MVP (mitral valve prolapse) 05/14/2016   Parkinson's disease 12/23/2015   Right hip pain 12/12/2016   Thrombocytopenia 02/08/2015   Tremor of right hand 02/14/2015    Past Surgical History:  Procedure Laterality Date   APPENDECTOMY  1995   CATARACT EXTRACTION, BILATERAL     COLONOSCOPY     COLONOSCOPY WITH PROPOFOL N/A 12/17/2018   Procedure: COLONOSCOPY WITH PROPOFOL;  Surgeon: Irving Copas., MD;  Location: [place];  Service: Gastroenterology;  Laterality: N/A;   ENDOSCOPIC MUCOSAL RESECTION N/A 12/17/2018   Procedure: ENDOSCOPIC MUCOSAL RESECTION;  Surgeon: Rush Landmark Telford Nab., MD;  Location: [place];  Service: Gastroenterology;  Laterality: N/A;   HEMOSTASIS CLIP PLACEMENT  12/17/2018   Procedure: HEMOSTASIS CLIP PLACEMENT;  Surgeon: Irving Copas., MD;  Location: [place];  Service: Gastroenterology;;   left knee scope  2003   POLYPECTOMY  12/17/2018   Procedure: POLYPECTOMY;  Surgeon: Rush Landmark Telford Nab., MD;  Location: [place];  Service: Gastroenterology;;   right knee scope  [place] INJECTION  12/17/2018   Procedure: SUBMUCOSAL LIFTING INJECTION;  Surgeon: Irving Copas., MD;  Location: [place];  Service: Gastroenterology;;   TOE SURGERY Left    metal 2nd toe- and top of foot- straighten bone   TONSILLECTOMY     TOTAL KNEE ARTHROPLASTY Left 07/06/2014   TOTAL NEPHRECTOMY Right     There were no  vitals filed for this visit.   Subjective Assessment - 03/30/21 1405     Subjective Pt reports his wife accidently threw away his most recent HEP handout - he is requesting a reprint. He states his back is back to normal.    Patient Stated Goals "To get some stamina and strength back in hips and knees."    Currently in Pain? No/denies    Pain Onset In the past 7 days                               [facility] Adult PT Treatment/Exercise - 03/30/21 1401       Knee/Hip Exercises: Aerobic    Nustep L5 x 6 min (UE/LE to promote reciprocal motion)               PWR [facility]) - 03/30/21 1401     PWR! exercises Moves in [place];Moves in prone    PWR! Up x10    PWR! Rock x10    PWR! Twist x10    PWR! Step x10   modified with hands resting on chair rather than floor   PWR! Up x10    PWR! Rock x10    PWR! Twist x10    PWR! Step x10                  PT Education - 03/30/21 1441     Education Details Quadruped & prone PWR! moves with modifcations to minimize LBP irritation    Person(s) Educated Patient    Methods Explanation;Demonstration;Handout;Verbal cues    Comprehension Verbalized understanding;Need further instruction;Verbal cues required;Returned demonstration              PT Short Term Goals - 03/16/21 1402       PT SHORT TERM GOAL #1   Title Independent with initial HEP to address LE flexibility and strengthening    Status Achieved   03/16/21     PT SHORT TERM GOAL #2   Title Patient will verbalize understanding of techniques to reduce freezing of gait episodes    Status On-going   03/16/21 - info provied today   Target Date 03/27/21               PT Long Term Goals - 03/07/21 1407       PT LONG TERM GOAL #1   Title Patient will be independent with ongoing/advanced HEP for self-management at home incorporating PWR! Moves as indicated    Status On-going    Target Date 04/10/21      PT LONG TERM GOAL #2   Title Patient will demonstrate improved B hip, knee and ankle strength to >/= 4+/5 for improved stability and ease of mobility    Status On-going    Target Date 04/10/21      PT LONG TERM GOAL #3   Title Patient will demonstrate increased gait speed to >/= 3.5 ft/sec with decreased frequency of freezing of gait episodes to reduce risk for falls with ambulation    Status On-going    Target Date 04/10/21      PT LONG TERM GOAL #4   Title Patient will improve Berg score to >/= 52/56 to improve safety and stability with ADLs in  standing and reduce risk for falls    Status On-going    Target Date 04/10/21      PT LONG TERM GOAL #5   Title Patient will improve FGA to >/=  25/30 to improve gait stability and reduce risk for falls    Status On-going    Target Date 04/10/21                   Plan - 03/30/21 1443     Clinical Impression Statement Vincent Walker reports the low back pain exacerbation he had been experiencing last visit is now back to his baseline. He did note that his wife accidently threw out the HEP handouts from last visit and expressed need for reprints and review of these exercises as well as clarification of the HEP PWR! Moves with standing PWR! Rock - all addressed to day - but otherwise reports no concerns with current HEP. Reviewed quadruped PWR! Moves initiated last visit with modifications made for PWR! Step placing hands on chair in front of pt rather than on the floor to allow for better ability to swing leg around to side with step w/o irritating his back - pt reporting better tolerance. Also introduced final PWR! Moves position in prone with pt noting some mild discomfort in low back associated with fatigue, therefore discussed modifications similar to quadruped position modifications. HEP instructions provided for both positions.    Comorbidities Parkinsons disease, mild neurocognitive disorder due to PD, HTN, MVP, L TKA, R knee scope, CRI, h/o renal cancer with R nephrectomy, thrombocytopenia, dyslipidemia, B macular degeneration    Rehab Potential Good    PT Frequency 2x / week    PT Duration 6 weeks    PT Treatment/Interventions ADLs/Self Care Home Management;DME Instruction;Gait training;Stair training;Functional mobility training;Therapeutic activities;Therapeutic exercise;Balance training;Neuromuscular re-education;Patient/family education;Manual techniques;Dry needling;Passive range of motion    PT Next Visit Plan Review PWR! Moves as neded; quad strengthening for knee stability;  core/lumbopelvic flexibilty and strengthening to promote upright posture and manage LBP; review HEP & update/modify as indicated    PT Home Exercise Plan Access Code: [email or web address] (12/6, updated 12/20);  PWR! Moves: Seated (12/6); Standing (12/8), Supine (12/15), Quadruped & Prone (12/29)    Consulted and Agree with Plan of Care Patient             Patient will benefit from skilled therapeutic intervention in order to improve the following deficits and impairments:  Abnormal gait, Decreased activity tolerance, Decreased balance, Decreased coordination, Decreased endurance, Decreased knowledge of precautions, Decreased knowledge of use of DME, Decreased mobility, Decreased strength, Difficulty walking, Impaired perceived functional ability, Impaired flexibility, Improper body mechanics, Postural dysfunction  Visit Diagnosis: Other abnormalities of gait and mobility  Unsteadiness on feet  Muscle weakness (generalized)     Problem List Patient Active Problem List   Diagnosis Date Noted   Eustachian tube dysfunction, left 02/21/2021   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   Early stage nonexudative age-related macular degeneration of both eyes 03/09/2019   Adenomatous polyp of ascending colon 10/09/2018   Epistaxis 07/07/2018   Allergies 07/07/2018   Obesity 06/15/2018   Increased thyroid stimulating hormone (TSH) level 06/07/2017   Right hip pain 12/12/2016   MVP (mitral valve prolapse) 05/14/2016   Parkinson's disease 12/23/2015   Constipation 12/23/2015   Hyperglycemia 12/23/2015   Tremor of right hand 02/14/2015   Left foot pain 02/14/2015   Chronic renal insufficiency 02/14/2015   Benign essential hypertension 02/08/2015   Dyslipidemia 02/08/2015   History of renal cell carcinoma 02/08/2015   Thrombocytopenia 02/08/2015   History of chicken pox     Percival Spanish, PT 03/30/2021, 6:36 PM  [facility] [address]  [place], [place], [address] Phone: [phone]   Fax:  [phone]  Name: Vincent Walker MRN: [id number] Date of Birth: Aug 30, 1945

## 2021-04-04 ENCOUNTER — Encounter: Payer: Self-pay | Admitting: Physical Therapy

## 2021-04-04 ENCOUNTER — Ambulatory Visit: Payer: Medicare Other | Attending: Neurology | Admitting: Physical Therapy

## 2021-04-04 ENCOUNTER — Other Ambulatory Visit: Payer: Self-pay

## 2021-04-04 DIAGNOSIS — M6281 Muscle weakness (generalized): Secondary | ICD-10-CM | POA: Insufficient documentation

## 2021-04-04 DIAGNOSIS — R2681 Unsteadiness on feet: Secondary | ICD-10-CM | POA: Diagnosis not present

## 2021-04-04 DIAGNOSIS — R2689 Other abnormalities of gait and mobility: Secondary | ICD-10-CM | POA: Insufficient documentation

## 2021-04-04 NOTE — Therapy (Signed)
[facility] [address]  [place], [place], [address] Phone: [phone]   Fax:  [phone]  Physical Therapy Treatment  Patient Details  Name: Vincent Walker MRN: [id number] Date of Birth: 08/22/1945 Referring Provider (PT): Alonza Bogus, MD   Encounter Date: 04/04/2021   PT End of Session - 04/04/21 1402     Visit Number 8    Number of Visits 12    Date for PT Re-Evaluation 04/10/21    Authorization Type Medicare & Federal BCBS    PT Start Time 4132    PT Stop Time 4401    PT Time Calculation (min) 41 min    Activity Tolerance Patient tolerated treatment well    Behavior During Therapy [facility] for tasks assessed/performed             Past Medical History:  Diagnosis Date   Adenomatous polyp of ascending colon 10/09/2018   Allergies 07/07/2018   Arthritis    Benign essential hypertension 02/08/2015   Chronic renal insufficiency 02/14/2015   Constipation 12/23/2015   Dyslipidemia 02/08/2015   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 Required nasal packing a couple of days ago.  Had a similar episode of epistaxis a little over a year ago.  At that visit, I could not see the exact bleeding spot. 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    MVP (mitral valve prolapse) 05/14/2016   Parkinson's disease 12/23/2015   Right hip pain 12/12/2016   Thrombocytopenia 02/08/2015   Tremor of right hand 02/14/2015    Past Surgical History:  Procedure Laterality Date   APPENDECTOMY  1995   CATARACT EXTRACTION, BILATERAL     COLONOSCOPY     COLONOSCOPY WITH PROPOFOL N/A 12/17/2018   Procedure: COLONOSCOPY WITH PROPOFOL;  Surgeon: Irving Copas., MD;  Location: [place];  Service: Gastroenterology;  Laterality: N/A;   ENDOSCOPIC MUCOSAL RESECTION N/A 12/17/2018   Procedure: ENDOSCOPIC MUCOSAL RESECTION;  Surgeon: Rush Landmark Telford Nab., MD;  Location: [place];  Service: Gastroenterology;  Laterality: N/A;   HEMOSTASIS CLIP PLACEMENT  12/17/2018   Procedure: HEMOSTASIS CLIP PLACEMENT;  Surgeon: Irving Copas., MD;  Location: [place];  Service: Gastroenterology;;   left knee scope  2003   POLYPECTOMY  12/17/2018   Procedure: POLYPECTOMY;  Surgeon: Rush Landmark Telford Nab., MD;  Location: [place];  Service: Gastroenterology;;   right knee scope  [place] INJECTION  12/17/2018   Procedure: SUBMUCOSAL LIFTING INJECTION;  Surgeon: Irving Copas., MD;  Location: [place];  Service: Gastroenterology;;   TOE SURGERY Left    metal 2nd toe- and top of foot- straighten bone   TONSILLECTOMY     TOTAL KNEE ARTHROPLASTY Left 07/06/2014   TOTAL NEPHRECTOMY Right     There were no  vitals filed for this visit.   Subjective Assessment - 04/04/21 1406     Subjective Pt feels like he shoulder be ready to try transitioning to the HEP by the end of his current POC.    Patient Stated Goals "To get some stamina and strength back in hips and knees."    Currently in Pain? No/denies    Pain Onset In the past 7 days                               [facility] Adult PT Treatment/Exercise - 04/04/21 1402       Knee/Hip Exercises: Aerobic   Recumbent Bike L4 x 6 min                [facility]) - 04/04/21 1402     PWR! exercises Moves in [place];Moves in prone;Functional moves    PWR! Up x10    PWR! Rock x10    PWR! Twist x10    PWR! Step x10   modified with hands resting on chair rather than floor   Comments PWR! Twist to assist with rolling over to side in prep for transition out of bed    PWR! Up x10    PWR! Rock x10    PWR! Twist x10    PWR! Step x10    Comments discussed use of chair fro modfications as needed but able to complete all exercises in traditional position    Comments PWR! Walk x 90 ft    PWR! Step Through Forward/Back PWR! Step forward & back x 10                  PT Education - 04/04/21 1443     Education Details PWR! Walk, use of supine PWR! moves to assist with transition OOB    Person(s) Educated Patient    Methods Explanation;Demonstration;Verbal cues;Handout    Comprehension Verbalized understanding;Verbal cues required;Returned demonstration;Need further instruction              PT Short Term Goals - 03/16/21 1402       PT SHORT TERM GOAL #1   Title Independent with initial HEP to address LE flexibility and strengthening    Status Achieved   03/16/21     PT SHORT TERM GOAL #2   Title Patient will verbalize understanding of techniques to reduce freezing of gait episodes    Status On-going   03/16/21 - info provied today   Target Date 03/27/21               PT Long Term Goals - 03/07/21 1407       PT LONG TERM GOAL #1   Title Patient will be independent with ongoing/advanced HEP for self-management at home incorporating PWR! Moves as indicated    Status On-going    Target Date 04/10/21      PT LONG TERM GOAL #2   Title Patient will demonstrate improved B hip, knee and ankle strength to >/= 4+/5 for improved stability and ease of mobility    Status On-going    Target Date 04/10/21      PT LONG TERM GOAL #3   Title Patient will demonstrate increased gait speed to >/= 3.5 ft/sec with decreased  frequency of freezing of gait episodes to reduce risk for falls with ambulation    Status On-going    Target Date 04/10/21      PT LONG TERM GOAL #  4   Title Patient will improve Berg score to >/= 52/56 to improve safety and stability with ADLs in standing and reduce risk for falls    Status On-going    Target Date 04/10/21      PT LONG TERM GOAL #5   Title Patient will improve FGA to >/= 25/30 to improve gait stability and reduce risk for falls    Status On-going    Target Date 04/10/21                   Plan - 04/04/21 1443     Clinical Impression Statement Delmas is nearing the end of his current POC and feels that he will be ready to transition to his HEP at that time but request review of the 2 most recent PWR! Moves positions. PWR! Moves reviewed in quadruped and prone including review of positional alternatives for quadruped PWR! Step as well as prone PWR! Up, Rock and Step if uncomfortable in traditional positions. Pt also noting difficulty initiating OOB mobility and initial stepping/gait in am, therefore provided instruction in utilization of supine PWR! Moves to assist with OOB transition as well as introduced PWR! Walk to promote increased step length and foot clearance when initiating walking in the morning. Will plan for goal assessment as well as any final review as needed next visit, with plan for transition to his HEP.    Comorbidities Parkinsons disease, mild neurocognitive disorder due to PD, HTN, MVP, L TKA, R knee scope, CRI, h/o renal cancer with R nephrectomy, thrombocytopenia, dyslipidemia, B macular degeneration    Rehab Potential Good    PT Frequency 2x / week    PT Duration 6 weeks    PT Treatment/Interventions ADLs/Self Care Home Management;DME Instruction;Gait training;Stair training;Functional mobility training;Therapeutic activities;Therapeutic exercise;Balance training;Neuromuscular re-education;Patient/family education;Manual techniques;Dry  needling;Passive range of motion    PT Next Visit Plan Goal assessment;  Review PWR! Moves as needed +/- quad strengthening for knee stability; Anticipate transition to HEP +/- 30-day hold    PT Home Exercise Plan Access Code: [email or web address] (12/6, updated 12/20);  PWR! Moves: Seated (12/6); Standing (12/8), Supine (12/15), Quadruped & Prone (12/29), PWR! Walk (1/3)    Consulted and Agree with Plan of Care Patient             Patient will benefit from skilled therapeutic intervention in order to improve the following deficits and impairments:  Abnormal gait, Decreased activity tolerance, Decreased balance, Decreased coordination, Decreased endurance, Decreased knowledge of precautions, Decreased knowledge of use of DME, Decreased mobility, Decreased strength, Difficulty walking, Impaired perceived functional ability, Impaired flexibility, Improper body mechanics, Postural dysfunction  Visit Diagnosis: Other abnormalities of gait and mobility  Unsteadiness on feet  Muscle weakness (generalized)     Problem List Patient Active Problem List   Diagnosis Date Noted   Eustachian tube dysfunction, left 02/21/2021   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   Early stage nonexudative age-related macular degeneration of both eyes 03/09/2019   Adenomatous polyp of ascending colon 10/09/2018   Epistaxis 07/07/2018   Allergies 07/07/2018   Obesity 06/15/2018   Increased thyroid stimulating hormone (TSH) level 06/07/2017   Right hip pain 12/12/2016   MVP (mitral valve prolapse) 05/14/2016   Parkinson's disease 12/23/2015   Constipation 12/23/2015   Hyperglycemia 12/23/2015   Tremor of right hand 02/14/2015   Left foot pain 02/14/2015   Chronic renal insufficiency 02/14/2015   Benign essential hypertension 02/08/2015   Dyslipidemia 02/08/2015   History of  renal cell carcinoma 02/08/2015   Thrombocytopenia 02/08/2015   History of chicken pox     Percival Spanish,  PT 04/04/2021, 6:56 PM  [facility] [address]  [place], [place], [address] Phone: [phone]   Fax:  [phone]  Name: JADARRIUS MASELLI MRN: [id number] Date of Birth: July 21, 1945

## 2021-04-06 ENCOUNTER — Other Ambulatory Visit: Payer: Self-pay

## 2021-04-06 ENCOUNTER — Encounter: Payer: Self-pay | Admitting: Physical Therapy

## 2021-04-06 ENCOUNTER — Ambulatory Visit: Payer: Medicare Other | Admitting: Physical Therapy

## 2021-04-06 DIAGNOSIS — R2689 Other abnormalities of gait and mobility: Secondary | ICD-10-CM

## 2021-04-06 DIAGNOSIS — R2681 Unsteadiness on feet: Secondary | ICD-10-CM | POA: Diagnosis not present

## 2021-04-06 DIAGNOSIS — M6281 Muscle weakness (generalized): Secondary | ICD-10-CM | POA: Diagnosis not present

## 2021-04-06 NOTE — Therapy (Signed)
[facility] [address]  [place], [place], [address] Phone: [phone]   Fax:  [phone]  Physical Therapy Treatment / Discharge Summary  Patient Details  Name: Vincent Walker MRN: [id number] Date of Birth: 1946/01/17 Referring Provider (PT): Alonza Bogus, MD  Progress Note  Reporting Period 02/27/2021 to 04/06/2021  See note below for Objective Data and Assessment of Progress/Goals.     Encounter Date: 04/06/2021   PT End of Session - 04/06/21 1359     Visit Number 9    Number of Visits 12    Date for PT Re-Evaluation 04/10/21    Authorization Type Medicare & Federal BCBS    PT Start Time 8921    PT Stop Time 1448    PT Time Calculation (min) 49 min    Activity Tolerance Patient tolerated treatment well    Behavior During Therapy [facility] for tasks assessed/performed             Past Medical History:  Diagnosis Date   Adenomatous polyp of ascending colon 10/09/2018   Allergies 07/07/2018   Arthritis    Benign essential hypertension 02/08/2015   Chronic renal insufficiency 02/14/2015   Constipation 12/23/2015   Dyslipidemia 02/08/2015   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 Required nasal packing a couple of days ago.  Had a similar episode of epistaxis a little over a year ago.  At that visit, I could not see the exact bleeding spot. EXAMINATION after packing removal reveals several excoriated areas anteriorly but I was unable to see t   Glaucoma    History of blood transfusion 2010   After Kidney surgery   History of chicken pox    History of renal cell carcinoma 02/08/2015   diagnosed and removed in 2010 Right Monitored by Dr Tresa Endo of urology at [facility] Dr Richarda Overlie, nephrology at [facility]   Hyperglycemia 12/23/2015   Hyperlipidemia    Increased thyroid stimulating hormone (TSH)  level 06/07/2017   Ingrown left big toenail 02/14/2015   Left foot pain 02/14/2015   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   MVP (mitral valve prolapse) 05/14/2016   Parkinson's disease 12/23/2015   Right hip pain 12/12/2016   Thrombocytopenia 02/08/2015   Tremor of right hand 02/14/2015    Past Surgical History:  Procedure Laterality Date   APPENDECTOMY  1995   CATARACT EXTRACTION, BILATERAL     COLONOSCOPY     COLONOSCOPY WITH PROPOFOL N/A 12/17/2018   Procedure: COLONOSCOPY WITH PROPOFOL;  Surgeon: Irving Copas., MD;  Location: [place];  Service: Gastroenterology;  Laterality: N/A;   ENDOSCOPIC MUCOSAL RESECTION N/A 12/17/2018   Procedure: ENDOSCOPIC MUCOSAL RESECTION;  Surgeon: Rush Landmark Telford Nab., MD;  Location: [place];  Service: Gastroenterology;  Laterality: N/A;   HEMOSTASIS CLIP PLACEMENT  12/17/2018   Procedure: HEMOSTASIS CLIP PLACEMENT;  Surgeon: Irving Copas., MD;  Location: [place];  Service: Gastroenterology;;   left knee scope  2003   POLYPECTOMY  12/17/2018   Procedure: POLYPECTOMY;  Surgeon: Rush Landmark Telford Nab., MD;  Location: [place];  Service: Gastroenterology;;   right knee scope  [place] INJECTION  12/17/2018   Procedure: SUBMUCOSAL LIFTING INJECTION;  Surgeon: Irving Copas., MD;  Location: [place];  Service: Gastroenterology;;   TOE SURGERY Left    metal 2nd toe- and top of foot- straighten  bone   TONSILLECTOMY     TOTAL KNEE ARTHROPLASTY Left 07/06/2014   TOTAL NEPHRECTOMY Right     There were no vitals filed for this visit.   Subjective Assessment - 04/06/21 1400     Subjective Pt reports his back was a little tender when he got up this morning but no pain now - thinks his mattress is too soft. Feels better with the prone and quadruped PWR! Moves after the review last visit.    Patient Stated Goals "To get some stamina and strength back in hips and  knees."    Currently in Pain? No/denies    Pain Onset In the past 7 days                [facility] PT Assessment - 04/06/21 1359       Assessment   Medical Diagnosis Parkinson's disease    Referring Provider (PT) Alonza Bogus, MD    Onset Date/Surgical Date --   2+ years   Next MD Visit 08/29/21      Strength   Right Hip Flexion 5/5    Right Hip Extension 5/5    Right Hip External Rotation  5/5    Right Hip Internal Rotation 5/5    Right Hip ABduction 5/5    Right Hip ADduction 5/5    Left Hip Flexion 5/5    Left Hip Extension 5/5    Left Hip External Rotation 5/5    Left Hip Internal Rotation 5/5    Left Hip ABduction 5/5    Left Hip ADduction 5/5    Right Knee Flexion 5/5    Right Knee Extension 5/5    Left Knee Flexion 5/5    Left Knee Extension 5/5    Right Ankle Dorsiflexion 5/5    Right Ankle Plantar Flexion 4/5   very limited lift with SLS heel raises but able to complete 11 reps   Left Ankle Dorsiflexion 5/5    Left Ankle Plantar Flexion 4/5   very limited lift with SLS heel raises but able to complete 12 reps     Ambulation/Gait   Ambulation/Gait Assistance 7: Independent    Assistive device None    Gait Pattern Step-through pattern;Decreased arm swing - right    Gait velocity 4.92 ft/sec      Standardized Balance Assessment   Five times sit to stand comments  12.65 sec w/o UE assist    10 Meter Walk 6.66 sec      Berg Balance Test   Sit to Stand Able to stand without using hands and stabilize independently    Standing Unsupported Able to stand safely 2 minutes    Sitting with Back Unsupported but Feet Supported on Floor or Stool Able to sit safely and securely 2 minutes    Stand to Sit Sits safely with minimal use of hands    Transfers Able to transfer safely, minor use of hands    Standing Unsupported with Eyes Closed Able to stand 10 seconds safely    Standing Unsupported with Feet Together Able to place feet together independently and stand 1 minute  safely    From Standing, Reach Forward with Outstretched Arm Can reach confidently >25 cm (10")    From Standing Position, Pick up Object from Floor Able to pick up shoe safely and easily    From Standing Position, Turn to Look Behind Over each Shoulder Looks behind from both sides and weight shifts well    Turn 360 Degrees Able  to turn 360 degrees safely one side only in 4 seconds or less    Standing Unsupported, Alternately Place Feet on Step/Stool Able to stand independently and safely and complete 8 steps in 20 seconds    Standing Unsupported, One Foot in Front Able to plae foot ahead of the other independently and hold 30 seconds    Standing on One Leg Able to lift leg independently and hold 5-10 seconds    Total Score 53    Berg comment: 52-55 lower risk for falls (> 25%)      Timed Up and Go Test   Normal TUG (seconds) 8.47    Manual TUG (seconds) 10.38    Cognitive TUG (seconds) 10.66      Functional Gait  Assessment   Gait Level Surface Walks 20 ft in less than 5.5 sec, no assistive devices, good speed, no evidence for imbalance, normal gait pattern, deviates no more than 6 in outside of the 12 in walkway width.    Change in Gait Speed Able to smoothly change walking speed without loss of balance or gait deviation. Deviate no more than 6 in outside of the 12 in walkway width.    Gait with Horizontal Head Turns Performs head turns smoothly with no change in gait. Deviates no more than 6 in outside 12 in walkway width    Gait with Vertical Head Turns Performs head turns with no change in gait. Deviates no more than 6 in outside 12 in walkway width.    Gait and Pivot Turn Pivot turns safely within 3 sec and stops quickly with no loss of balance.    Step Over Obstacle Is able to step over 2 stacked shoe boxes taped together (9 in total height) without changing gait speed. No evidence of imbalance.    Gait with Narrow Base of Support Ambulates 7-9 steps.    Gait with Eyes Closed Walks 20  ft, slow speed, abnormal gait pattern, evidence for imbalance, deviates 10-15 in outside 12 in walkway width. Requires more than 9 sec to ambulate 20 ft.    Ambulating Backwards Walks 20 ft, uses assistive device, slower speed, mild gait deviations, deviates 6-10 in outside 12 in walkway width.    Steps Alternating feet, must use rail.    Total Score 25    FGA comment: 25-28 = low risk fall                           [facility] Adult PT Treatment/Exercise - 04/06/21 1359       Knee/Hip Exercises: Aerobic   Nustep L5 x 6 min (UE/LE to promote reciprocal motion)                       PT Short Term Goals - 04/06/21 1404       PT SHORT TERM GOAL #1   Title Independent with initial HEP to address LE flexibility and strengthening    Status Achieved   03/16/21     PT SHORT TERM GOAL #2   Title Patient will verbalize understanding of techniques to reduce freezing of gait episodes    Status Achieved   04/06/21              PT Long Term Goals - 04/06/21 1405       PT LONG TERM GOAL #1   Title Patient will be independent with ongoing/advanced HEP for self-management at home incorporating PWR! Moves as  indicated    Status Achieved   04/06/21     PT LONG TERM GOAL #2   Title Patient will demonstrate improved B hip, knee and ankle strength to >/= 4+/5 for improved stability and ease of mobility    Status Achieved   04/06/21 - met except B ankle PF 4/5     PT LONG TERM GOAL #3   Title Patient will demonstrate increased gait speed to >/= 3.5 ft/sec with decreased frequency of freezing of gait episodes to reduce risk for falls with ambulation    Baseline 2.78 ft/sec    Status Achieved   04/06/21 - gait speed = 4.92 ft/sec     PT LONG TERM GOAL #4   Title Patient will improve Berg score to >/= 52/56 to improve safety and stability with ADLs in standing and reduce risk for falls    Baseline Berg = 47/56    Status Achieved   04/06/21 Vincent Walker = 53/56     PT LONG TERM  GOAL #5   Title Patient will improve FGA to >/= 25/30 to improve gait stability and reduce risk for falls    Baseline FGA = 16/30    Status Achieved   04/06/21 - FGA = 25/30                  Plan - 04/06/21 1406     Clinical Impression Statement Vincent Walker has demonstrated excellent progress with PT with gains noted in flexibility, strength, balance and overall mobility and gait. His overall LE strength is now 5/5 with exception of B ankle PF 4/5. He demonstrates improved posture and gait stability with gait speed increased from 2.78 ft/sec to 4.92 ft/sec with decreased reliance on [facility], although he reports he still takes the cane if going some place unfamiliar just for safety. Improvements noted on all standardized tests with 5xSTS decreased from 16.07 sec to 12.65 sec, all versions of TUG decreased by 2-3 sec, Berg improved from 47/56 to 53/56 and FGA improved from 16/30 to 25/30 - all indicating a reduction in fall risk to a low fall risk. He reports good understanding and comfort with ongoing HEP and is aware of community resources related to Parkinsons (wife added to [employer] group email at his request). Vincent Walker has met all of his goals for the current episode and feels confident with transitioning to his HEP at this time. Will plan for Parkinsons screen in 6 months with pt requesting for clinic to call to set-up appointment closer to the date to allow for him to work around his grandsons baseball schedule as he likes to attend the games, including some travel games.    Comorbidities Parkinsons disease, mild neurocognitive disorder due to PD, HTN, MVP, L TKA, R knee scope, CRI, h/o renal cancer with R nephrectomy, thrombocytopenia, dyslipidemia, B macular degeneration    PT Treatment/Interventions ADLs/Self Care Home Management;DME Instruction;Gait training;Stair training;Functional mobility training;Therapeutic activities;Therapeutic exercise;Balance training;Neuromuscular  re-education;Patient/family education;Manual techniques;Dry needling;Passive range of motion    PT Next Visit Plan Discharge/transition to HEP; will call in ~5 months to schedule 6 month PD screen    PT Home Exercise Plan Access Code: [email or web address] (12/6, updated 12/20);  PWR! Moves: Seated (12/6); Standing (12/8), Supine (12/15), Quadruped & Prone (12/29), PWR! Walk (1/3)    Consulted and Agree with Plan of Care Patient             Patient will benefit from skilled therapeutic intervention in order to improve the following deficits  and impairments:  Abnormal gait, Decreased activity tolerance, Decreased balance, Decreased coordination, Decreased endurance, Decreased knowledge of precautions, Decreased knowledge of use of DME, Decreased mobility, Decreased strength, Difficulty walking, Impaired perceived functional ability, Impaired flexibility, Improper body mechanics, Postural dysfunction  Visit Diagnosis: Other abnormalities of gait and mobility  Unsteadiness on feet  Muscle weakness (generalized)     Problem List Patient Active Problem List   Diagnosis Date Noted   Eustachian tube dysfunction, left 02/21/2021   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   Early stage nonexudative age-related macular degeneration of both eyes 03/09/2019   Adenomatous polyp of ascending colon 10/09/2018   Epistaxis 07/07/2018   Allergies 07/07/2018   Obesity 06/15/2018   Increased thyroid stimulating hormone (TSH) level 06/07/2017   Right hip pain 12/12/2016   MVP (mitral valve prolapse) 05/14/2016   Parkinson's disease 12/23/2015   Constipation 12/23/2015   Hyperglycemia 12/23/2015   Tremor of right hand 02/14/2015   Left foot pain 02/14/2015   Chronic renal insufficiency 02/14/2015   Benign essential hypertension 02/08/2015   Dyslipidemia 02/08/2015   History of renal cell carcinoma 02/08/2015   Thrombocytopenia 02/08/2015   History of chicken pox     PHYSICAL  THERAPY DISCHARGE SUMMARY  Visits from Start of Care: 9  Current functional level related to goals / functional outcomes:   Refer to above goal assessment and clinical impression.   Remaining deficits:   As above.    Education / Equipment:   HEP, PWR! Moves   Patient agrees to discharge. Patient goals were met. Patient is being discharged due to meeting the stated rehab goals.   Percival Spanish, PT 04/06/2021, 7:52 PM  [facility] [address]  [place], [place], [address] Phone: [phone]   Fax:  [phone]  Name: WYNDHAM SANTILLI MRN: [id number] Date of Birth: [birth year]

## 2021-04-16 ENCOUNTER — Other Ambulatory Visit: Payer: Self-pay | Admitting: Family Medicine

## 2021-04-17 ENCOUNTER — Other Ambulatory Visit (HOSPITAL_BASED_OUTPATIENT_CLINIC_OR_DEPARTMENT_OTHER): Payer: Self-pay

## 2021-04-17 DIAGNOSIS — D485 Neoplasm of uncertain behavior of skin: Secondary | ICD-10-CM | POA: Diagnosis not present

## 2021-04-17 DIAGNOSIS — L821 Other seborrheic keratosis: Secondary | ICD-10-CM | POA: Diagnosis not present

## 2021-04-17 DIAGNOSIS — C44622 Squamous cell carcinoma of skin of right upper limb, including shoulder: Secondary | ICD-10-CM | POA: Diagnosis not present

## 2021-04-17 DIAGNOSIS — L570 Actinic keratosis: Secondary | ICD-10-CM | POA: Diagnosis not present

## 2021-04-17 DIAGNOSIS — R208 Other disturbances of skin sensation: Secondary | ICD-10-CM | POA: Diagnosis not present

## 2021-04-17 MED ORDER — AMLODIPINE BESYLATE 2.5 MG PO TABS
1.2500 mg | ORAL_TABLET | Freq: Every day | ORAL | 0 refills | Status: DC
  Filled 2021-04-17: qty 90, 90d supply, fill #0

## 2021-04-17 MED ORDER — FLUOROURACIL 5 % EX CREA
TOPICAL_CREAM | CUTANEOUS | 1 refills | Status: DC
  Filled 2021-04-17: qty 40, 30d supply, fill #0

## 2021-04-21 ENCOUNTER — Other Ambulatory Visit (HOSPITAL_BASED_OUTPATIENT_CLINIC_OR_DEPARTMENT_OTHER): Payer: Self-pay

## 2021-04-21 ENCOUNTER — Other Ambulatory Visit: Payer: Self-pay | Admitting: Neurology

## 2021-04-21 MED ORDER — CARBIDOPA-LEVODOPA ER 50-200 MG PO TBCR
1.0000 | EXTENDED_RELEASE_TABLET | Freq: Every day | ORAL | 1 refills | Status: DC
  Filled 2021-04-21: qty 90, 90d supply, fill #0
  Filled 2021-07-20: qty 90, 90d supply, fill #1

## 2021-04-24 ENCOUNTER — Other Ambulatory Visit: Payer: Self-pay | Admitting: Neurology

## 2021-04-24 DIAGNOSIS — G20 Parkinson's disease: Secondary | ICD-10-CM

## 2021-04-24 DIAGNOSIS — F0670 Mild neurocognitive disorder due to known physiological condition without behavioral disturbance: Secondary | ICD-10-CM

## 2021-04-25 ENCOUNTER — Other Ambulatory Visit (HOSPITAL_BASED_OUTPATIENT_CLINIC_OR_DEPARTMENT_OTHER): Payer: Self-pay

## 2021-04-25 ENCOUNTER — Other Ambulatory Visit: Payer: Self-pay

## 2021-04-25 MED ORDER — MIRTAZAPINE 15 MG PO TABS
15.0000 mg | ORAL_TABLET | Freq: Every day | ORAL | 0 refills | Status: DC
  Filled 2021-04-25 – 2021-04-28 (×3): qty 90, 90d supply, fill #0

## 2021-04-26 ENCOUNTER — Other Ambulatory Visit (HOSPITAL_BASED_OUTPATIENT_CLINIC_OR_DEPARTMENT_OTHER): Payer: Self-pay

## 2021-04-28 ENCOUNTER — Other Ambulatory Visit (HOSPITAL_BASED_OUTPATIENT_CLINIC_OR_DEPARTMENT_OTHER): Payer: Self-pay

## 2021-05-01 ENCOUNTER — Other Ambulatory Visit (HOSPITAL_BASED_OUTPATIENT_CLINIC_OR_DEPARTMENT_OTHER): Payer: Self-pay

## 2021-05-02 ENCOUNTER — Other Ambulatory Visit (HOSPITAL_BASED_OUTPATIENT_CLINIC_OR_DEPARTMENT_OTHER): Payer: Self-pay

## 2021-05-03 ENCOUNTER — Other Ambulatory Visit (HOSPITAL_BASED_OUTPATIENT_CLINIC_OR_DEPARTMENT_OTHER): Payer: Self-pay

## 2021-05-03 DIAGNOSIS — H401112 Primary open-angle glaucoma, right eye, moderate stage: Secondary | ICD-10-CM | POA: Diagnosis not present

## 2021-05-03 DIAGNOSIS — H0100B Unspecified blepharitis left eye, upper and lower eyelids: Secondary | ICD-10-CM | POA: Diagnosis not present

## 2021-05-03 DIAGNOSIS — H04123 Dry eye syndrome of bilateral lacrimal glands: Secondary | ICD-10-CM | POA: Diagnosis not present

## 2021-05-03 DIAGNOSIS — H401123 Primary open-angle glaucoma, left eye, severe stage: Secondary | ICD-10-CM | POA: Diagnosis not present

## 2021-05-03 DIAGNOSIS — H0100A Unspecified blepharitis right eye, upper and lower eyelids: Secondary | ICD-10-CM | POA: Diagnosis not present

## 2021-05-03 MED ORDER — TRAVOPROST (BAK FREE) 0.004 % OP SOLN
OPHTHALMIC | 6 refills | Status: DC
  Filled 2021-05-03: qty 5, 40d supply, fill #0
  Filled 2021-07-09: qty 5, 50d supply, fill #1

## 2021-05-03 MED ORDER — LUMIGAN 0.01 % OP SOLN
1.0000 [drp] | Freq: Every evening | OPHTHALMIC | 3 refills | Status: DC
  Filled 2021-05-03: qty 7.5, 90d supply, fill #0

## 2021-05-04 ENCOUNTER — Other Ambulatory Visit (HOSPITAL_BASED_OUTPATIENT_CLINIC_OR_DEPARTMENT_OTHER): Payer: Self-pay

## 2021-05-05 DIAGNOSIS — H401123 Primary open-angle glaucoma, left eye, severe stage: Secondary | ICD-10-CM

## 2021-05-05 HISTORY — DX: Primary open-angle glaucoma, left eye, severe stage: H40.1123

## 2021-05-16 ENCOUNTER — Other Ambulatory Visit (HOSPITAL_BASED_OUTPATIENT_CLINIC_OR_DEPARTMENT_OTHER): Payer: Self-pay

## 2021-06-01 ENCOUNTER — Other Ambulatory Visit (HOSPITAL_BASED_OUTPATIENT_CLINIC_OR_DEPARTMENT_OTHER): Payer: Self-pay

## 2021-06-20 ENCOUNTER — Other Ambulatory Visit (HOSPITAL_BASED_OUTPATIENT_CLINIC_OR_DEPARTMENT_OTHER): Payer: Self-pay

## 2021-06-23 ENCOUNTER — Telehealth: Payer: Self-pay | Admitting: Family Medicine

## 2021-06-23 NOTE — Telephone Encounter (Signed)
Left message for patient to call back and schedule Medicare Annual Wellness Visit (AWV) in office.  ? ?If not able to come in office, please offer to do virtually or by telephone.  Left office number and my jabber [phone]. ? ?Last AWV:06/11/2019 ? ?Please schedule at anytime with Nurse Health Advisor. ?  ?

## 2021-07-10 ENCOUNTER — Other Ambulatory Visit (HOSPITAL_BASED_OUTPATIENT_CLINIC_OR_DEPARTMENT_OTHER): Payer: Self-pay

## 2021-07-10 DIAGNOSIS — N400 Enlarged prostate without lower urinary tract symptoms: Secondary | ICD-10-CM | POA: Diagnosis not present

## 2021-07-10 DIAGNOSIS — Z85528 Personal history of other malignant neoplasm of kidney: Secondary | ICD-10-CM | POA: Diagnosis not present

## 2021-07-10 DIAGNOSIS — C649 Malignant neoplasm of unspecified kidney, except renal pelvis: Secondary | ICD-10-CM | POA: Diagnosis not present

## 2021-07-10 DIAGNOSIS — C641 Malignant neoplasm of right kidney, except renal pelvis: Secondary | ICD-10-CM | POA: Diagnosis not present

## 2021-07-10 DIAGNOSIS — N1831 Chronic kidney disease, stage 3a: Secondary | ICD-10-CM | POA: Diagnosis not present

## 2021-07-10 DIAGNOSIS — Z905 Acquired absence of kidney: Secondary | ICD-10-CM | POA: Diagnosis not present

## 2021-07-11 ENCOUNTER — Other Ambulatory Visit (HOSPITAL_BASED_OUTPATIENT_CLINIC_OR_DEPARTMENT_OTHER): Payer: Self-pay

## 2021-07-20 ENCOUNTER — Other Ambulatory Visit: Payer: Self-pay | Admitting: Neurology

## 2021-07-20 ENCOUNTER — Other Ambulatory Visit (HOSPITAL_BASED_OUTPATIENT_CLINIC_OR_DEPARTMENT_OTHER): Payer: Self-pay

## 2021-07-20 MED ORDER — CARBIDOPA-LEVODOPA 25-100 MG PO TABS
ORAL_TABLET | ORAL | 0 refills | Status: DC
  Filled 2021-07-20: qty 630, 90d supply, fill #0

## 2021-07-21 ENCOUNTER — Other Ambulatory Visit (HOSPITAL_BASED_OUTPATIENT_CLINIC_OR_DEPARTMENT_OTHER): Payer: Self-pay

## 2021-07-24 ENCOUNTER — Other Ambulatory Visit: Payer: Self-pay | Admitting: Neurology

## 2021-07-24 ENCOUNTER — Other Ambulatory Visit (HOSPITAL_BASED_OUTPATIENT_CLINIC_OR_DEPARTMENT_OTHER): Payer: Self-pay

## 2021-07-24 DIAGNOSIS — F0670 Mild neurocognitive disorder due to known physiological condition without behavioral disturbance: Secondary | ICD-10-CM

## 2021-07-24 DIAGNOSIS — G20 Parkinson's disease: Secondary | ICD-10-CM

## 2021-07-24 MED ORDER — MIRTAZAPINE 15 MG PO TABS
15.0000 mg | ORAL_TABLET | Freq: Every day | ORAL | 0 refills | Status: DC
  Filled 2021-07-24: qty 30, 30d supply, fill #0

## 2021-08-09 ENCOUNTER — Telehealth: Payer: Self-pay | Admitting: Family Medicine

## 2021-08-09 NOTE — Telephone Encounter (Signed)
Left message for patient to call back and schedule Medicare Annual Wellness Visit (AWV).  ? ?Please offer to do virtually or by telephone.  Left office number and my jabber [phone]. ? ?Last AWV:06/11/2019 ? ?Please schedule at anytime with Nurse Health Advisor. ?  ?

## 2021-08-14 ENCOUNTER — Other Ambulatory Visit: Payer: Self-pay | Admitting: Neurology

## 2021-08-14 ENCOUNTER — Other Ambulatory Visit (HOSPITAL_BASED_OUTPATIENT_CLINIC_OR_DEPARTMENT_OTHER): Payer: Self-pay

## 2021-08-14 MED ORDER — DIVALPROEX SODIUM ER 250 MG PO TB24
250.0000 mg | ORAL_TABLET | Freq: Every day | ORAL | 1 refills | Status: DC
  Filled 2021-08-14: qty 90, 90d supply, fill #0
  Filled 2021-11-10: qty 90, 90d supply, fill #1

## 2021-08-15 ENCOUNTER — Other Ambulatory Visit (HOSPITAL_BASED_OUTPATIENT_CLINIC_OR_DEPARTMENT_OTHER): Payer: Self-pay

## 2021-08-15 DIAGNOSIS — L578 Other skin changes due to chronic exposure to nonionizing radiation: Secondary | ICD-10-CM | POA: Diagnosis not present

## 2021-08-15 DIAGNOSIS — L570 Actinic keratosis: Secondary | ICD-10-CM | POA: Diagnosis not present

## 2021-08-15 MED ORDER — FLUOROURACIL 5 % EX CREA
TOPICAL_CREAM | CUTANEOUS | 1 refills | Status: DC
  Filled 2021-08-15: qty 40, 30d supply, fill #0

## 2021-08-17 ENCOUNTER — Other Ambulatory Visit (HOSPITAL_BASED_OUTPATIENT_CLINIC_OR_DEPARTMENT_OTHER): Payer: Self-pay

## 2021-08-17 ENCOUNTER — Encounter: Payer: Self-pay | Admitting: Family Medicine

## 2021-08-17 DIAGNOSIS — H353131 Nonexudative age-related macular degeneration, bilateral, early dry stage: Secondary | ICD-10-CM | POA: Diagnosis not present

## 2021-08-17 DIAGNOSIS — H0100A Unspecified blepharitis right eye, upper and lower eyelids: Secondary | ICD-10-CM | POA: Diagnosis not present

## 2021-08-17 DIAGNOSIS — H04123 Dry eye syndrome of bilateral lacrimal glands: Secondary | ICD-10-CM | POA: Diagnosis not present

## 2021-08-17 DIAGNOSIS — H401123 Primary open-angle glaucoma, left eye, severe stage: Secondary | ICD-10-CM | POA: Diagnosis not present

## 2021-08-17 DIAGNOSIS — H43812 Vitreous degeneration, left eye: Secondary | ICD-10-CM | POA: Diagnosis not present

## 2021-08-17 DIAGNOSIS — Z882 Allergy status to sulfonamides status: Secondary | ICD-10-CM | POA: Diagnosis not present

## 2021-08-17 DIAGNOSIS — Z961 Presence of intraocular lens: Secondary | ICD-10-CM | POA: Diagnosis not present

## 2021-08-17 DIAGNOSIS — H02403 Unspecified ptosis of bilateral eyelids: Secondary | ICD-10-CM | POA: Diagnosis not present

## 2021-08-17 DIAGNOSIS — H0100B Unspecified blepharitis left eye, upper and lower eyelids: Secondary | ICD-10-CM | POA: Diagnosis not present

## 2021-08-17 DIAGNOSIS — H401112 Primary open-angle glaucoma, right eye, moderate stage: Secondary | ICD-10-CM | POA: Diagnosis not present

## 2021-08-17 DIAGNOSIS — Z79899 Other long term (current) drug therapy: Secondary | ICD-10-CM | POA: Diagnosis not present

## 2021-08-17 DIAGNOSIS — I10 Essential (primary) hypertension: Secondary | ICD-10-CM | POA: Diagnosis not present

## 2021-08-17 DIAGNOSIS — Z886 Allergy status to analgesic agent status: Secondary | ICD-10-CM | POA: Diagnosis not present

## 2021-08-17 DIAGNOSIS — Z87891 Personal history of nicotine dependence: Secondary | ICD-10-CM | POA: Diagnosis not present

## 2021-08-17 MED ORDER — SIMBRINZA 1-0.2 % OP SUSP
OPHTHALMIC | 11 refills | Status: DC
  Filled 2021-08-17: qty 16, 54d supply, fill #0
  Filled 2021-10-03: qty 16, 54d supply, fill #1
  Filled 2021-12-04: qty 16, 54d supply, fill #2
  Filled 2022-01-29: qty 16, 54d supply, fill #3

## 2021-08-18 ENCOUNTER — Other Ambulatory Visit (HOSPITAL_BASED_OUTPATIENT_CLINIC_OR_DEPARTMENT_OTHER): Payer: Self-pay

## 2021-08-22 NOTE — Progress Notes (Signed)
Assessment/Plan:   1.  Parkinsons Disease  -take  carbidopa/levodopa 25/100 IR, 2 at 8am/2 at 11am/2 at 2pm/1 at 6pm.  He does take an extra prn RLS  -Continue carbidopa/levodopa 50/200 at bedtime  -refer to PT 2.  RBD/RLS/insomnia  -Continue clonazepam 0.5 mg, full tablet at bedtime.  Tried to stop this medication and things got much worse, especially mood change and daytime hypersomnolence did not get better.  Lower dosages resulted in increasing RBD symptoms  -I discussed with patient and wife that we really cannot increase or add more medications for restless leg.  He is already on clonazepam and long-acting levodopa at bedtime.  I worry about adding further medications that help restless leg, especially opioids, as these can cause more confusion.  As above, he takes an extra levodopa if needed in the middle of the night and that does seem to help.  -on mirtazapine, 15 mg q hs.  Refilled today.  R/B/SE were discussed.  The opportunity to ask questions was given and they were answered to the best of my ability.  The patient expressed understanding and willingness to follow the outlined treatment protocols.  -some of night issues due to nocturia.  Needs to f/u with PCP/urology 3.  Anxiety/depression  -Being followed by primary care.  On Depakote ER, 250 mg daily.  Did offer referral to psychiatry. 4.  Daytime hypersomnolence  -Nocturnal polysomnogram was negative, but he was awake most of the night so not sure that it was accurate 5.  MCI  -Neurocognitive testing done in July, 2022 was unremarkable.  Pt/Patients wife feels that cognitively he is getting slower/worse.  Discussed that we could always repeat this test, but last year he just had MCI.  We will schedule repeat.  -Offered donepezil and discussed that we would need to watch for bradycardia, especially given he is already on a beta-blocker.  Ultimately, patient declined.  -Patient would really like to go back to driving.  Dr. Melvyn Novas  felt that neurocognitive test was not a great predictor of an individual's ability to safely operate a motor vehicle.  He did recommend a driving evaluation, which I agree with. 6.  History of hypertension  -On 2 antihypertensives.  We will need to watch this given autonomic instability associated with Parkinson's disease.   Subjective:   Vincent Walker was seen today in follow up for Parkinsons disease.  My previous records were reviewed prior to todays visit as well as outside records available to me. Pt with wife who supplements hx. last visit, we slightly increased his levodopa, but also switched back from the extended release to the immediate release.  They report that doing okay with that.  We also increased his clonazepam to a full tablet at bedtime for symptoms of RBD.  They report that he has done okay with RBD, but continues to have issues with restless leg.  He does state that he will get up and take an extra IR levodopa and that really works.  Wife thinks that memory has gotten worse. Pt doesn't disagree.  He will have to ask wife if he has already taken medication.   No falls.  He had a spell where he was more off balance and had to use cane even in house for about a week.  Wasn't otherwise sick.  This was a few months ago and he is able to go without cane in house again.  He wasn't dizzy at the time.    Current prescribed  movement disorder medications: carbidopa/levodopa 25/100 , 2 at 6am/2 at 10am/2 at 2pm/1 at 6pm (an increase, but also switched back from the CR) Clonazepam 0.5 mg, 1 tablet at bedtime (an increase) carbidopa/levodopa 50/200 at bedtime Mirtazapine, 15 mg nightly Depakote, 250 mg daily  PREVIOUS MEDICATIONS: Carbidopa/levodopa 25/100 IR; carbidopa/levodopa 25/100 CR (tried to change to see if would help EDS and didn't so changed back)  ALLERGIES:   Allergies  Allergen Reactions   Nsaids    Sulfa Antibiotics Other (See Comments)    Had a reaction as a child    Tolmetin Other (See Comments)    CURRENT MEDICATIONS:  Outpatient Encounter Medications as of 08/29/2021  Medication Sig   acetaminophen (TYLENOL) 500 MG tablet Take 1,000 mg by mouth every 6 (six) hours as needed (pain.).    amLODipine (NORVASC) 2.5 MG tablet Take 1/2 - 1 tablets (1.25-2.5 mg total) by mouth daily.   aspirin EC 81 MG tablet Take 81 mg by mouth at bedtime.   atenolol (TENORMIN) 25 MG tablet TAKE 1 TABLET BY MOUTH EVERY EVENING   atorvastatin (LIPITOR) 20 MG tablet Take 1 tablet (20 mg total) by mouth daily.   bimatoprost (LUMIGAN) 0.01 % SOLN Place 1 drop into both eyes nightly.   brimonidine (ALPHAGAN) 0.2 % ophthalmic solution Place 1 drop into both eyes 2 (two) times daily.   brimonidine (ALPHAGAN) 0.2 % ophthalmic solution Place 1 drop into both eyes 2 (two) times daily.   Brinzolamide-Brimonidine (SIMBRINZA) 1-0.2 % SUSP Place 1 drop into both eyes 3 times daily.   carbidopa-levodopa (SINEMET CR) 50-200 MG tablet TAKE 1 TABLET BY MOUTH AT BEDTIME.   carbidopa-levodopa (SINEMET IR) 25-100 MG tablet Take 2 tablets by mouth at 8am, 2 tabs at 11am, 2 tabs at 2pm, then 1 tab at 6pm as directed   clonazePAM (KLONOPIN) 0.5 MG tablet Take 1 tablet (0.5 mg total) by mouth at bedtime.   COVID-19 mRNA bivalent vaccine, Pfizer, injection Inject into the muscle.   divalproex (DEPAKOTE ER) 250 MG 24 hr tablet Take 1 tablet (250 mg total) by mouth daily.   fluorouracil (EFUDEX) 5 % cream Apply to the nose 2 times daily for 3 weeks as tolerated   fluticasone (CUTIVATE) 0.05 % cream Apply twice a day to affected areas on face for 1 week on 1 week off as needed for flares   influenza vaccine adjuvanted (FLUAD) 0.5 ML injection Inject into the muscle.   mirtazapine (REMERON) 15 MG tablet Take 1 tablet (15 mg total) by mouth at bedtime.   Multiple Vitamins-Minerals (PRESERVISION AREDS 2 PO) Take 1 tablet by mouth daily.   Polyethyl Glycol-Propyl Glycol (LUBRICANT EYE DROPS) 0.4-0.3 % SOLN  Place 1 drop into both eyes 3 (three) times daily as needed (dry/irritated eyes.).   sodium chloride (OCEAN) 0.65 % SOLN nasal spray Place 1 spray into both nostrils as needed for congestion.   Travoprost, BAK Free, (TRAVATAN Z) 0.004 % SOLN ophthalmic solution Instill one drop into both eyes at night   fluorouracil (EFUDEX) 5 % cream Apply twice daily to scalp 2-3weeks to point of irritation   No facility-administered encounter medications on file as of 08/29/2021.    Objective:   PHYSICAL EXAMINATION:    VITALS:   Vitals:   08/29/21 1052  BP: 135/80  Pulse: 64  SpO2: 97%  Weight: 228 lb (103.4 kg)  Height: '6\' 3"'$  (1.905 m)      GEN:  The patient appears stated age and is in NAD. HEENT:  Normocephalic, atraumatic.  The mucous membranes are moist. The superficial temporal arteries are without ropiness or tenderness. CV:  RRR Lungs:  CTAB Neck/HEME:  There are no carotid bruits bilaterally.  Neurological examination:  Orientation: The patient is alert and oriented x3. Cranial nerves: There is good facial symmetry with facial hypomimia. The speech is fluent and clear. Soft palate rises symmetrically and there is no tongue deviation. Hearing is intact to conversational tone. Sensation: Sensation is intact to light touch throughout Motor: Strength is at least antigravity x4.  Movement examination: Tone: There is normal tone today in the upper and lower extremities. Abnormal movements: there is RUE rest tremor, intermittent Coordination:  There is mild decremation, with any form of RAMS, including alternating supination and pronation of the forearm, hand opening and closing, finger taps, heel taps and toe taps, R>L Gait and Station: The patient has min difficulty arising out of a deep-seated chair without the use of the hands. The patient's stride length is slightly decreased and he drags the right leg a bit  I have reviewed and interpreted the following labs independently     Chemistry      Component Value Date/Time   NA 139 12/27/2020 1443   K 4.5 12/27/2020 1443   CL 104 12/27/2020 1443   CO2 27 12/27/2020 1443   BUN 17 12/27/2020 1443   CREATININE 1.40 12/27/2020 1443      Component Value Date/Time   CALCIUM 10.0 12/27/2020 1443   ALKPHOS 82 12/27/2020 1443   AST 21 12/27/2020 1443   ALT 12 12/27/2020 1443   BILITOT 0.5 12/27/2020 1443       Lab Results  Component Value Date   WBC 9.2 08/28/2019   HGB 14.0 08/28/2019   HCT 41.8 08/28/2019   MCV 92.1 08/28/2019   PLT 190.0 08/28/2019    Lab Results  Component Value Date   TSH 2.45 04/06/2019     Total time spent on today's visit was 43 minutes, including both face-to-face time and nonface-to-face time.  Time included that spent on review of records (prior notes available to me/labs/imaging if pertinent), discussing treatment and goals, answering patient's questions and coordinating care.  Cc:  Mosie Lukes, MD

## 2021-08-27 ENCOUNTER — Other Ambulatory Visit: Payer: Self-pay | Admitting: Neurology

## 2021-08-27 DIAGNOSIS — G20 Parkinson's disease: Secondary | ICD-10-CM

## 2021-08-27 DIAGNOSIS — F0670 Mild neurocognitive disorder due to known physiological condition without behavioral disturbance: Secondary | ICD-10-CM

## 2021-08-28 ENCOUNTER — Encounter: Payer: Self-pay | Admitting: Neurology

## 2021-08-29 ENCOUNTER — Other Ambulatory Visit (HOSPITAL_BASED_OUTPATIENT_CLINIC_OR_DEPARTMENT_OTHER): Payer: Self-pay

## 2021-08-29 ENCOUNTER — Encounter: Payer: Self-pay | Admitting: Neurology

## 2021-08-29 ENCOUNTER — Ambulatory Visit (INDEPENDENT_AMBULATORY_CARE_PROVIDER_SITE_OTHER): Payer: Medicare Other | Admitting: Neurology

## 2021-08-29 VITALS — BP 135/80 | HR 64 | Ht 75.0 in | Wt 228.0 lb

## 2021-08-29 DIAGNOSIS — G4752 REM sleep behavior disorder: Secondary | ICD-10-CM | POA: Diagnosis not present

## 2021-08-29 DIAGNOSIS — G2581 Restless legs syndrome: Secondary | ICD-10-CM | POA: Diagnosis not present

## 2021-08-29 DIAGNOSIS — R413 Other amnesia: Secondary | ICD-10-CM | POA: Diagnosis not present

## 2021-08-29 DIAGNOSIS — G20 Parkinson's disease: Secondary | ICD-10-CM

## 2021-08-29 MED ORDER — MIRTAZAPINE 15 MG PO TABS
15.0000 mg | ORAL_TABLET | Freq: Every day | ORAL | 1 refills | Status: DC
  Filled 2021-08-29: qty 90, 90d supply, fill #0
  Filled 2021-11-22: qty 90, 90d supply, fill #1

## 2021-08-29 MED ORDER — CLONAZEPAM 0.5 MG PO TABS
0.5000 mg | ORAL_TABLET | Freq: Every day | ORAL | 1 refills | Status: DC
  Filled 2021-08-29: qty 90, 90d supply, fill #0
  Filled 2021-11-22: qty 90, 90d supply, fill #1

## 2021-08-29 NOTE — Patient Instructions (Signed)
You have been referred for a neurocognitive evaluation (i.e., evaluation of memory and thinking abilities). Please bring someone with you to this appointment if possible, as it is helpful for the neuropsychologist to hear from both you and another adult who knows you well. Please bring eyeglasses and hearing aids if you wear them and take any medications as you normally would.    The evaluation will take approximately 2-3 hours and has two parts:   The first part is a clinical interview with the neuropsychologist, Dr. Merz.  During the interview, the neuropsychologist will speak with you and the individual you brought to the appointment.    The second part of the evaluation is testing with the doctor's technician, aka psychometrician, Dana or Kim. During the testing, the technician will ask you to remember different types of material, solve problems, and answer some questionnaires. Your family member will not be present for this portion of the evaluation.   Please note: We have to reserve several hours of the neuropsychologist's time and the psychometrician's time for your evaluation appointment. As such, there is a No-Show fee of $100. If you are unable to attend any of your appointments, please contact our office as soon as possible to reschedule.  

## 2021-08-30 ENCOUNTER — Other Ambulatory Visit (HOSPITAL_BASED_OUTPATIENT_CLINIC_OR_DEPARTMENT_OTHER): Payer: Self-pay

## 2021-09-06 DIAGNOSIS — H353131 Nonexudative age-related macular degeneration, bilateral, early dry stage: Secondary | ICD-10-CM | POA: Diagnosis not present

## 2021-09-06 DIAGNOSIS — H401123 Primary open-angle glaucoma, left eye, severe stage: Secondary | ICD-10-CM | POA: Diagnosis not present

## 2021-09-06 DIAGNOSIS — H43813 Vitreous degeneration, bilateral: Secondary | ICD-10-CM | POA: Diagnosis not present

## 2021-09-06 DIAGNOSIS — Z961 Presence of intraocular lens: Secondary | ICD-10-CM | POA: Diagnosis not present

## 2021-09-06 DIAGNOSIS — H35363 Drusen (degenerative) of macula, bilateral: Secondary | ICD-10-CM | POA: Diagnosis not present

## 2021-09-06 DIAGNOSIS — H401112 Primary open-angle glaucoma, right eye, moderate stage: Secondary | ICD-10-CM | POA: Diagnosis not present

## 2021-09-06 DIAGNOSIS — H04123 Dry eye syndrome of bilateral lacrimal glands: Secondary | ICD-10-CM | POA: Diagnosis not present

## 2021-09-06 DIAGNOSIS — H0100B Unspecified blepharitis left eye, upper and lower eyelids: Secondary | ICD-10-CM | POA: Diagnosis not present

## 2021-09-06 DIAGNOSIS — Z83511 Family history of glaucoma: Secondary | ICD-10-CM | POA: Diagnosis not present

## 2021-09-06 DIAGNOSIS — D3131 Benign neoplasm of right choroid: Secondary | ICD-10-CM | POA: Diagnosis not present

## 2021-09-06 DIAGNOSIS — H0100A Unspecified blepharitis right eye, upper and lower eyelids: Secondary | ICD-10-CM | POA: Diagnosis not present

## 2021-09-06 DIAGNOSIS — H52203 Unspecified astigmatism, bilateral: Secondary | ICD-10-CM | POA: Diagnosis not present

## 2021-09-19 ENCOUNTER — Ambulatory Visit: Payer: Medicare Other | Admitting: Physical Therapy

## 2021-09-19 NOTE — Therapy (Incomplete)
OUTPATIENT PHYSICAL THERAPY PARKINSON'S EVALUATION   Patient Name: Vincent Walker MRN: [id number] DOB:[birth year], 76 y.o., male Today's Date: 09/19/2021      Past Medical History:  Diagnosis Date   Adenomatous polyp of ascending colon 10/09/2018   Allergies 07/07/2018   Arthritis    Benign essential hypertension 02/08/2015   Chronic renal insufficiency 02/14/2015   Constipation 12/23/2015   Dyslipidemia 02/08/2015   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 Required nasal packing a couple of days ago.  Had a similar episode of epistaxis a little over a year ago.  At that visit, I could not see the exact bleeding spot. EXAMINATION after packing removal reveals several excoriated areas anteriorly but I was unable to see t   Glaucoma    History of blood transfusion 2010   After Kidney surgery   History of chicken pox    History of renal cell carcinoma 02/08/2015   diagnosed and removed in 2010 Right Monitored by Dr Tresa Endo of urology at [facility] Dr Richarda Overlie, nephrology at [facility]   Hyperglycemia 12/23/2015   Hyperlipidemia    Increased thyroid stimulating hormone (TSH) level 06/07/2017   Ingrown left big toenail 02/14/2015   Left foot pain 02/14/2015   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   MVP (mitral valve prolapse) 05/14/2016   Parkinson's disease 12/23/2015   Right hip pain 12/12/2016   Thrombocytopenia 02/08/2015   Tremor of right hand 02/14/2015   Past Surgical History:  Procedure Laterality Date   APPENDECTOMY  1995   CATARACT EXTRACTION, BILATERAL     COLONOSCOPY     COLONOSCOPY WITH PROPOFOL N/A 12/17/2018   Procedure: COLONOSCOPY WITH PROPOFOL;  Surgeon: Irving Copas., MD;  Location: [place];  Service: Gastroenterology;  Laterality: N/A;   ENDOSCOPIC MUCOSAL RESECTION N/A 12/17/2018   Procedure: ENDOSCOPIC  MUCOSAL RESECTION;  Surgeon: Rush Landmark Telford Nab., MD;  Location: [place];  Service: Gastroenterology;  Laterality: N/A;   HEMOSTASIS CLIP PLACEMENT  12/17/2018   Procedure: HEMOSTASIS CLIP PLACEMENT;  Surgeon: Irving Copas., MD;  Location: [facility] ENDOSCOPY;  Service: Gastroenterology;;   left knee scope  2003   POLYPECTOMY  12/17/2018   Procedure: POLYPECTOMY;  Surgeon: Rush Landmark Telford Nab., MD;  Location: [facility] ENDOSCOPY;  Service: Gastroenterology;;   right knee scope  [place] INJECTION  12/17/2018   Procedure: SUBMUCOSAL LIFTING INJECTION;  Surgeon: Irving Copas., MD;  Location: [facility] ENDOSCOPY;  Service: Gastroenterology;;   TOE SURGERY Left    metal 2nd toe- and top of foot- straighten bone   TONSILLECTOMY     TOTAL KNEE ARTHROPLASTY Left 07/06/2014   TOTAL NEPHRECTOMY Right    Patient Active Problem List   Diagnosis Date Noted   Eustachian tube dysfunction, left 02/21/2021   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   Early stage nonexudative age-related macular degeneration of both eyes 03/09/2019   Adenomatous polyp of ascending colon 10/09/2018   Epistaxis 07/07/2018   Allergies 07/07/2018   Obesity 06/15/2018   Increased thyroid stimulating hormone (TSH) level 06/07/2017   Right hip pain 12/12/2016   MVP (mitral valve prolapse) 05/14/2016   Parkinson's disease 12/23/2015   Constipation 12/23/2015   Hyperglycemia 12/23/2015   Tremor of right hand 02/14/2015   Left foot pain 02/14/2015   Chronic renal insufficiency 02/14/2015   Benign essential hypertension 02/08/2015   Dyslipidemia 02/08/2015   History of renal  cell carcinoma 02/08/2015   Thrombocytopenia 02/08/2015   History of chicken pox     PCP: Mosie Lukes, MD  REFERRING PROVIDER: Ludwig Clarks, DO  ONSET DATE: ***  REFERRING DIAG: G20 (ICD-10-CM) - Parkinson's disease ([place])  THERAPY DIAG:  No diagnosis found.  RATIONALE FOR EVALUATION  AND TREATMENT: {HABREHAB:27488}  SUBJECTIVE:      SUBJECTIVE STATEMENT: *** Pt accompanied by: {accompnied:27141}  PAIN:  Are you having pain? {OPRCPAIN:27236}  PERTINENT HISTORY: *** Parkinson's disease, mild neurocognitive disorder due to PD, HTN, MVP, L TKA, R knee scope, CRI, h/o renal cancer with R nephrectomy, thrombocytopenia, dyslipidemia, B macular degeneration    PRECAUTIONS: {Therapy precautions:24002}  WEIGHT BEARING RESTRICTIONS {Yes ***/No:24003}  FALLS: Has patient fallen in last 6 months? {fallsyesno:27318}  LIVING ENVIRONMENT: Lives with: {OPRC lives with:25569::"lives with their family"} Lives in: {Lives in:25570} Stairs: {opstairs:27293} Has following equipment at home: {Assistive devices:23999}  PLOF: {PLOF:24004}  PATIENT GOALS: ***  OBJECTIVE:   LSVT BIG EVALUATION & EXAMINATION   Neurological and Other Medical Information:   What were your initial symptoms of Parkinson's disease?    Do you have tremors?  {Yes/No:304960894}   Do you have any pain? {Yes/No:304960894} Pain rating (on scale of 1-10 with 10 being most severe):     Medical Information:    Medication for Parkinson's disease:    In what ways are your medication(s) for Parkinson's helpful?     Do you experience on/off symptoms? {NLG/XQ:119417408}    Motor Symptoms:    When did you first start to notice changes in your movement you associate with Parkinson's disease?    What are your current symptoms?    What do you do when you want to move the best you possibly can?   Has Parkinson's disease caused you to move less or be less active? {Yes/No:304960894}  Have you noticed if your movement is slower than it used to be? For example, walking, getting dressed, doing household chores, bathing, etc. {Yes/No:304960894}   Have you or others noticed any changes in your posture? {Yes/No:304960894}   How many (if any) falls have you had in the last six months?    What factors  contributed to those falls?    Have you noticed any freezing with your movement? {Yes/No:304960894}   Are there some activities you now need help with because of your Parkinson's disease? For example, getting socks or shoes on, buttoning, getting up from low chairs, walking on uneven ground, etc.    Have you noticed any changes in the functioning of your hands? {Yes/No:304960894}   Has Parkinson's disease caused you to use your more affected hand less? {Yes/No:304960894}  Have you noticed any changes in your ability to: {lsvt difficulty tasks:27354}  Have you noticed if your hands feel any weaker than they used to? {Yes/No:304960894}     Movement Situations:    If you had one situation in which you wanted to move well, what would it be?    DIAGNOSTIC FINDINGS:  ***  COGNITION: Overall cognitive status: {cognition:24006}   SENSATION: {sensation:27233}  COORDINATION: ***  EDEMA:  {edema:24020}  MUSCLE TONE: {LE tone:25568}  MUSCLE LENGTH: Hamstrings: Right *** deg; Left *** deg Thomas test: Right *** deg; Left *** deg Hamstrings: *** ITB: *** Piriformis: *** Hip flexors: *** Quads: *** Heelcord: ***  DTRs: {DTR SITE:24025}  POSTURE:  {posture:25561}  LOWER EXTREMITY ROM:     {AROM/PROM:27142}  Right Eval Left Eval  Hip flexion    Hip extension  Hip abduction    Hip adduction    Hip internal rotation    Hip external rotation    Knee flexion    Knee extension    Ankle dorsiflexion    Ankle plantarflexion    Ankle inversion    Ankle eversion     (Blank rows = not tested)  LOWER EXTREMITY MMT:    MMT Right Eval Left Eval  Hip flexion    Hip extension    Hip abduction    Hip adduction    Hip internal rotation    Hip external rotation    Knee flexion    Knee extension    Ankle dorsiflexion    Ankle plantarflexion    Ankle inversion    Ankle eversion    (Blank rows = not tested)  BED MOBILITY:  {Bed  mobility:24027}  TRANSFERS: Assistive device utilized: {Assistive devices:23999}  Sit to stand: {Levels of assistance:24026} Stand to sit: {Levels of assistance:24026} Chair to chair: {Levels of assistance:24026} Floor: {Levels of assistance:24026}  GAIT: Gait pattern: {gait characteristics:25376} Distance walked: *** Assistive device utilized: {Assistive devices:23999} Level of assistance: {Levels of assistance:24026} Comments: ***  RAMP: Level of Assistance: {Levels of assistance:24026} Assistive device utilized: {Assistive devices:23999} Ramp Comments: ***  CURB:  Level of Assistance: {Levels of assistance:24026} Assistive device utilized: {Assistive devices:23999} Curb Comments: ***  STAIRS:  Level of Assistance: {Levels of assistance:24026}  Stair Negotiation Technique: {Stair Technique:27161} with {Rail Assistance:27162}  Number of Stairs: ***   Height of Stairs: ***  Comments: ***  FUNCTIONAL TESTS:  {Functional tests:24029}  Standardized Balance Assessment  As of 04/06/21 (D/C from PT)    Five times sit to stand comments  12.65 sec w/o UE assist     10 Meter Walk 6.66 sec          Berg Balance Test    Sit to Stand Able to stand without using hands and stabilize independently     Standing Unsupported Able to stand safely 2 minutes     Sitting with Back Unsupported but Feet Supported on Floor or Stool Able to sit safely and securely 2 minutes     Stand to Sit Sits safely with minimal use of hands     Transfers Able to transfer safely, minor use of hands     Standing Unsupported with Eyes Closed Able to stand 10 seconds safely     Standing Unsupported with Feet Together Able to place feet together independently and stand 1 minute safely     From Standing, Reach Forward with Outstretched Arm Can reach confidently >25 cm (10")     From Standing Position, Pick up Object from Floor Able to pick up shoe safely and easily     From Standing Position, Turn to Look Behind  Over each Shoulder Looks behind from both sides and weight shifts well     Turn 360 Degrees Able to turn 360 degrees safely one side only in 4 seconds or less     Standing Unsupported, Alternately Place Feet on Step/Stool Able to stand independently and safely and complete 8 steps in 20 seconds     Standing Unsupported, One Foot in Front Able to plae foot ahead of the other independently and hold 30 seconds     Standing on One Leg Able to lift leg independently and hold 5-10 seconds     Total Score 53     Berg comment: 52-55 lower risk for falls (> 25%)  Timed Up and Go Test    Normal TUG (seconds) 8.47     Manual TUG (seconds) 10.38     Cognitive TUG (seconds) 10.66          Functional Gait  Assessment    Gait Level Surface Walks 20 ft in less than 5.5 sec, no assistive devices, good speed, no evidence for imbalance, normal gait pattern, deviates no more than 6 in outside of the 12 in walkway width.     Change in Gait Speed Able to smoothly change walking speed without loss of balance or gait deviation. Deviate no more than 6 in outside of the 12 in walkway width.     Gait with Horizontal Head Turns Performs head turns smoothly with no change in gait. Deviates no more than 6 in outside 12 in walkway width     Gait with Vertical Head Turns Performs head turns with no change in gait. Deviates no more than 6 in outside 12 in walkway width.     Gait and Pivot Turn Pivot turns safely within 3 sec and stops quickly with no loss of balance.     Step Over Obstacle Is able to step over 2 stacked shoe boxes taped together (9 in total height) without changing gait speed. No evidence of imbalance.     Gait with Narrow Base of Support Ambulates 7-9 steps.     Gait with Eyes Closed Walks 20 ft, slow speed, abnormal gait pattern, evidence for imbalance, deviates 10-15 in outside 12 in walkway width. Requires more than 9 sec to ambulate 20 ft.     Ambulating Backwards Walks 20 ft, uses assistive  device, slower speed, mild gait deviations, deviates 6-10 in outside 12 in walkway width.     Steps Alternating feet, must use rail.     Total Score 25     FGA comment: 25-28 = low risk fall     PATIENT SURVEYS:  {rehab surveys:24030}   TODAY'S TREATMENT: ***   PATIENT EDUCATION: Education details: {Education details:27468} Person educated: {Person educated:25204} Education method: {Education Method:25205} Education comprehension: {Education Comprehension:25206}   HOME EXERCISE PROGRAM: ***    GOALS: Goals reviewed with patient? {yes/no:20286}  SHORT TERM GOALS: Target date: {follow up:25551} (Remove Blue Hyperlink)  Patient will be independent with initial HEP. Baseline: *** Goal status: {GOALSTATUS:25110}  2.  Patient will demonstrate decreased fall risk by scoring < 25 sec on TUG. Baseline: *** Goal status: {GOALSTATUS:25110}  3.  Patient will be educated on strategies to decrease risk of falls.  Baseline: *** Goal status: {GOALSTATUS:25110}  4.  Patient will verbalize tips to reduce freezing/festination with gait and turns. Baseline: *** Goal status: {GOALSTATUS:25110}  LONG TERM GOALS: Target date: {follow up:25551} (Remove Blue Hyperlink)  Patient will be independent with advanced/ongoing HEP to improve outcomes and carryover.  Baseline: *** Goal status: {GOALSTATUS:25110}  2.  Patient will be able to ambulate 600' with LRAD with good safety to access community.  Baseline: *** Goal status: {GOALSTATUS:25110}  3.  Patient will be able to step up/down curb safely with LRAD for safety with community ambulation.  Baseline: *** Goal status: {GOALSTATUS:25110}   4.  Patient will demonstrate gait speed of >/= 1.8 ft/sec (0.55 m/s) to be a safe limited community ambulator with decreased risk for recurrent falls.  Baseline: *** Goal status: {GOALSTATUS:25110}  5.  Patient will improve 5x STS time to </= *** seconds to demonstrate improved functional  strength and transfer efficiency. Baseline: *** Goal status: {GOALSTATUS:25110}  6.  Patient will demonstrate at least 19/24 on DGI to improve gait stability and reduce risk for falls. Baseline: *** Goal status: {GOALSTATUS:25110}  7.  Patient will demonstrate at least 18/30 on FGA to improve gait stability and reduce risk for falls. ([place] = 4 points) Baseline: *** Goal status: {GOALSTATUS:25110}  8.  Patient will improve Berg score to >/= ***/56 to improve safety and stability with ADLs in standing and reduce risk for falls. (MCD = 8 points)   Baseline: *** Goal status: {GOALSTATUS:25110}  9.  8.  Patient will report >/= *** 67% on ABC scale to demonstrate improved balance confidence and decreased risk for falls. Baseline: *** Goal status: {GOALSTATUS:25110}  10. Patient will verbalize understanding of local Parkinson's disease community resources, including community fitness post d/c. Baseline: *** Goal status: {GOALSTATUS:25110}   ASSESSMENT:  CLINICAL IMPRESSION: Vincent Walker is a 76 y.o. male who was seen today for physical therapy evaluation and treatment for ***.   02/27/21 - Eval - Vincent Walker is a 76 y/o male referred to OP PT for Parkinson's disease. He was initially diagnosed ~2+ years ago but this is his first PT encounter related to the PD diagnosis. He notes initially the changes were subtle but more recent has noted a decline in his stamina and proximal LE strength at hips and knees. He also reports R UE tremor (pill rolling at rest) which worsens with anxiety as well as worsening STM memory deficit especially with recall of new information. He does note more frequently instances of shuffling gait which can lead to freezing episodes - he carries a SPC around for balance, esp when outdoors, but denies any recent falls. He notes decreased LE flexibility but demonstrates only mild LE weakness with exception of moderate B PF weakness. Gait speed decreased at 2.78 ft/sec. All  versions of TUG were below fall risk threshold and within 1.5 sec variation, however Berg score of 47/56 reveals moderate risk for falls and FGA score of 16/30 indicates a high risk for falls. Vincent Walker will benefit from skilled PT for stretching/flexibility along with core and LE stability/strength training to improve posture and balance, as well as balance and dynamic gait training incorporating PWR! Moves to improve safety and decrease risk for falls.   04/06/21 - D/C - Vincent Walker has demonstrated excellent progress with PT with gains noted in flexibility, strength, balance and overall mobility and gait. His overall LE strength is now 5/5 with exception of B ankle PF 4/5. He demonstrates improved posture and gait stability with gait speed increased from 2.78 ft/sec to 4.92 ft/sec with decreased reliance on [facility], although he reports he still takes the cane if going some place unfamiliar just for safety. Improvements noted on all standardized tests with 5xSTS decreased from 16.07 sec to 12.65 sec, all versions of TUG decreased by 2-3 sec, Berg improved from 47/56 to 53/56 and FGA improved from 16/30 to 25/30 - all indicating a reduction in fall risk to a low fall risk. He reports good understanding and comfort with ongoing HEP and is aware of community resources related to Parkinson's (wife added to [employer] [facility]'s group email at his request). Vincent Walker has met all of his goals for the current episode and feels confident with transitioning to his HEP at this time. Will plan for Parkinson's screen in 6 months with pt requesting for clinic to call to set-up appointment closer to the date to allow for him to work around his grandson's baseball schedule as he likes to  attend the games, including some travel games.    OBJECTIVE IMPAIRMENTS: {opptimpairments:25111}.   ACTIVITY LIMITATIONS: {activitylimitations:27494}  PARTICIPATION LIMITATIONS: {participationrestrictions:25113}  PERSONAL FACTORS: {Personal  factors:25162} are also affecting patient's functional outcome.   REHAB POTENTIAL: {rehabpotential:25112}  CLINICAL DECISION MAKING: {clinical decision making:25114}  EVALUATION COMPLEXITY: {Evaluation complexity:25115}  PLAN: PT FREQUENCY: {rehab frequency:25116}  PT DURATION: {rehab duration:25117}  PLANNED INTERVENTIONS: {rehab planned interventions:25118::"Therapeutic exercises","Therapeutic activity","Neuromuscular re-education","Balance training","Gait training","Patient/Family education","Joint mobilization"}  PLAN FOR NEXT SESSION: ***   Percival Spanish, PT 09/19/2021, 9:51 AM

## 2021-09-22 ENCOUNTER — Other Ambulatory Visit (HOSPITAL_BASED_OUTPATIENT_CLINIC_OR_DEPARTMENT_OTHER): Payer: Self-pay

## 2021-09-26 ENCOUNTER — Ambulatory Visit: Payer: Medicare Other | Attending: Neurology | Admitting: Physical Therapy

## 2021-09-26 ENCOUNTER — Other Ambulatory Visit: Payer: Self-pay

## 2021-09-26 ENCOUNTER — Encounter: Payer: Self-pay | Admitting: Physical Therapy

## 2021-09-26 DIAGNOSIS — R2689 Other abnormalities of gait and mobility: Secondary | ICD-10-CM | POA: Diagnosis not present

## 2021-09-26 DIAGNOSIS — G20 Parkinson's disease: Secondary | ICD-10-CM | POA: Diagnosis not present

## 2021-10-04 ENCOUNTER — Other Ambulatory Visit (HOSPITAL_BASED_OUTPATIENT_CLINIC_OR_DEPARTMENT_OTHER): Payer: Self-pay

## 2021-10-05 ENCOUNTER — Other Ambulatory Visit (HOSPITAL_BASED_OUTPATIENT_CLINIC_OR_DEPARTMENT_OTHER): Payer: Self-pay

## 2021-10-09 ENCOUNTER — Encounter: Payer: Medicare Other | Admitting: Physical Therapy

## 2021-10-12 ENCOUNTER — Encounter: Payer: Medicare Other | Admitting: Physical Therapy

## 2021-10-16 ENCOUNTER — Other Ambulatory Visit: Payer: Self-pay | Admitting: Neurology

## 2021-10-16 ENCOUNTER — Other Ambulatory Visit (HOSPITAL_BASED_OUTPATIENT_CLINIC_OR_DEPARTMENT_OTHER): Payer: Self-pay

## 2021-10-16 ENCOUNTER — Encounter: Payer: Medicare Other | Admitting: Physical Therapy

## 2021-10-16 MED ORDER — CARBIDOPA-LEVODOPA ER 50-200 MG PO TBCR
1.0000 | EXTENDED_RELEASE_TABLET | Freq: Every day | ORAL | 1 refills | Status: DC
  Filled 2021-10-16: qty 90, 90d supply, fill #0
  Filled 2022-01-15: qty 90, 90d supply, fill #1

## 2021-10-16 MED ORDER — CARBIDOPA-LEVODOPA 25-100 MG PO TABS
ORAL_TABLET | ORAL | 0 refills | Status: DC
  Filled 2021-10-16: qty 630, 90d supply, fill #0

## 2021-10-19 ENCOUNTER — Encounter: Payer: Medicare Other | Admitting: Physical Therapy

## 2021-10-23 ENCOUNTER — Encounter: Payer: Medicare Other | Admitting: Physical Therapy

## 2021-10-26 ENCOUNTER — Encounter: Payer: Medicare Other | Admitting: Physical Therapy

## 2021-10-30 ENCOUNTER — Encounter: Payer: Medicare Other | Admitting: Physical Therapy

## 2021-11-10 ENCOUNTER — Other Ambulatory Visit (HOSPITAL_BASED_OUTPATIENT_CLINIC_OR_DEPARTMENT_OTHER): Payer: Self-pay

## 2021-11-15 DIAGNOSIS — L570 Actinic keratosis: Secondary | ICD-10-CM | POA: Diagnosis not present

## 2021-11-15 DIAGNOSIS — L578 Other skin changes due to chronic exposure to nonionizing radiation: Secondary | ICD-10-CM | POA: Diagnosis not present

## 2021-11-15 DIAGNOSIS — D485 Neoplasm of uncertain behavior of skin: Secondary | ICD-10-CM | POA: Diagnosis not present

## 2021-11-15 DIAGNOSIS — L820 Inflamed seborrheic keratosis: Secondary | ICD-10-CM | POA: Diagnosis not present

## 2021-11-15 DIAGNOSIS — L538 Other specified erythematous conditions: Secondary | ICD-10-CM | POA: Diagnosis not present

## 2021-11-15 DIAGNOSIS — Z09 Encounter for follow-up examination after completed treatment for conditions other than malignant neoplasm: Secondary | ICD-10-CM | POA: Diagnosis not present

## 2021-11-22 ENCOUNTER — Other Ambulatory Visit (HOSPITAL_BASED_OUTPATIENT_CLINIC_OR_DEPARTMENT_OTHER): Payer: Self-pay

## 2021-11-27 ENCOUNTER — Ambulatory Visit (INDEPENDENT_AMBULATORY_CARE_PROVIDER_SITE_OTHER): Payer: Medicare Other

## 2021-11-27 VITALS — Ht 75.0 in | Wt 220.0 lb

## 2021-11-27 DIAGNOSIS — Z0000 Encounter for general adult medical examination without abnormal findings: Secondary | ICD-10-CM

## 2021-11-27 NOTE — Progress Notes (Signed)
Subjective:   Vincent Walker is a 76 y.o. male who presents for Medicare Annual/Subsequent preventive examination.  I connected with L[place] today by telephone and verified that I am speaking with the correct person using two identifiers. Location patient: home Location provider: work Persons participating in the virtual visit: patient, Marine scientist.    I discussed the limitations, risks, security and privacy concerns of performing an evaluation and management service by telephone and the availability of in person appointments. I also discussed with the patient that there may be a patient responsible charge related to this service. The patient expressed understanding and verbally consented to this telephonic visit.    Interactive audio and video telecommunications were attempted between this provider and patient, however failed, due to patient having technical difficulties OR patient did not have access to video capability.  We continued and completed visit with audio only.  Some vital signs may be absent or patient reported.   Time Spent with patient on telephone encounter: 2- minutes   Review of Systems     Cardiac Risk Factors include: advanced age (>30mn, >>50women);male gender;dyslipidemia;hypertension     Objective:    Today's Vitals   11/27/21 1317  Weight: 220 lb (99.8 kg)  Height: '6\' 3"'$  (1.905 m)   Body mass index is 27.5 kg/m.     11/27/2021    1:21 PM 09/26/2021    2:46 PM 08/29/2021   10:59 AM 02/27/2021    8:53 AM 01/27/2021   10:45 AM 07/28/2020   10:48 AM 01/27/2020   11:25 AM  Advanced Directives  Does Patient Have a Medical Advance Directive? Yes Yes Yes Yes Yes Yes Yes  Type of AParamedicof A[place]orsbyLiving will HEsch[place]Living will HTurtle LakeLiving will;Out of facility DNR (pink MOST or yellow form) H[place]Living will  HW[place]Living will;Out of facility DNR  (pink MOST or yellow form) HHendricksOut of facility DNR (pink MOST or yellow form);Living will  Does patient want to make changes to medical advance directive?  No - Patient declined  No - Patient declined     Copy of HE[place] Chart? No - copy requested No - copy requested  No - copy requested       Current Medications (verified) Outpatient Encounter Medications as of 11/27/2021  Medication Sig   acetaminophen (TYLENOL) 500 MG tablet Take 1,000 mg by mouth every 6 (six) hours as needed (pain.).    amLODipine (NORVASC) 2.5 MG tablet Take 1/2 - 1 tablets (1.25-2.5 mg total) by mouth daily. (Patient taking differently: Take 1.25 mg by mouth daily.)   atenolol (TENORMIN) 25 MG tablet TAKE 1 TABLET BY MOUTH EVERY EVENING   atorvastatin (LIPITOR) 20 MG tablet Take 1 tablet (20 mg total) by mouth daily.   brimonidine (ALPHAGAN) 0.2 % ophthalmic solution Place 1 drop into both eyes 2 (two) times daily.   carbidopa-levodopa (SINEMET CR) 50-200 MG tablet TAKE 1 TABLET BY MOUTH AT BEDTIME.   carbidopa-levodopa (SINEMET IR) 25-100 MG tablet Take 2 tablets by mouth at 8am, 2 tabs at 11am, 2 tabs at 2pm, then 1 tab at 6pm as directed   clonazePAM (KLONOPIN) 0.5 MG tablet Take 1 tablet (0.5 mg total) by mouth at bedtime.   divalproex (DEPAKOTE ER) 250 MG 24 hr tablet Take 1 tablet (250 mg total) by mouth daily.   fluorouracil (EFUDEX) 5 % cream Apply to the nose 2 times  daily for 3 weeks as tolerated   fluticasone (CUTIVATE) 0.05 % cream Apply twice a day to affected areas on face for 1 week on 1 week off as needed for flares   mirtazapine (REMERON) 15 MG tablet Take 1 tablet (15 mg total) by mouth at bedtime.   Multiple Vitamins-Minerals (PRESERVISION AREDS 2 PO) Take 1 tablet by mouth daily.   Polyethyl Glycol-Propyl Glycol (LUBRICANT EYE DROPS) 0.4-0.3 % SOLN Place 1 drop into both eyes 3 (three) times daily as needed (dry/irritated eyes.).   Travoprost, BAK Free,  (TRAVATAN Z) 0.004 % SOLN ophthalmic solution Instill one drop into both eyes at night   aspirin EC 81 MG tablet Take 81 mg by mouth at bedtime. (Patient not taking: Reported on 09/26/2021)   bimatoprost (LUMIGAN) 0.01 % SOLN Place 1 drop into both eyes nightly. (Patient not taking: Reported on 09/26/2021)   brimonidine (ALPHAGAN) 0.2 % ophthalmic solution Place 1 drop into both eyes 2 (two) times daily. (Patient not taking: Reported on 09/26/2021)   Brinzolamide-Brimonidine (SIMBRINZA) 1-0.2 % SUSP Place 1 drop into both eyes 3 times daily.   COVID-19 mRNA bivalent vaccine, Pfizer, injection Inject into the muscle. (Patient not taking: Reported on 09/26/2021)   fluorouracil (EFUDEX) 5 % cream Apply twice daily to scalp 2-3weeks to point of irritation   influenza vaccine adjuvanted (FLUAD) 0.5 ML injection Inject into the muscle. (Patient not taking: Reported on 09/26/2021)   sodium chloride (OCEAN) 0.65 % SOLN nasal spray Place 1 spray into both nostrils as needed for congestion. (Patient not taking: Reported on 09/26/2021)   No facility-administered encounter medications on file as of 11/27/2021.    Allergies (verified) Nsaids, Sulfa antibiotics, and Tolmetin   History: Past Medical History:  Diagnosis Date   Adenomatous polyp of ascending colon 10/09/2018   Allergies 07/07/2018   Arthritis    Benign essential hypertension 02/08/2015   Chronic renal insufficiency 02/14/2015   Constipation 12/23/2015   Dyslipidemia 02/08/2015   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 Required nasal packing a couple of days ago.  Had a similar episode of epistaxis a little over a year ago.  At that visit, I could not see the exact bleeding spot. EXAMINATION after packing removal reveals several excoriated areas anteriorly but I was unable to see t   Glaucoma    History of blood transfusion 2010    After Kidney surgery   History of chicken pox    History of renal cell carcinoma 02/08/2015   diagnosed and removed in 2010 Right Monitored by Dr Tresa Endo of urology at [facility] Dr Richarda Overlie, nephrology at [facility]   Hyperglycemia 12/23/2015   Hyperlipidemia    Increased thyroid stimulating hormone (TSH) level 06/07/2017   Ingrown left big toenail 02/14/2015   Left foot pain 02/14/2015   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   MVP (mitral valve prolapse) 05/14/2016   Parkinson's disease 12/23/2015   Right hip pain 12/12/2016   Thrombocytopenia 02/08/2015   Tremor of right hand 02/14/2015   Past Surgical History:  Procedure Laterality Date   APPENDECTOMY  1995   CATARACT EXTRACTION, BILATERAL     COLONOSCOPY     COLONOSCOPY WITH PROPOFOL N/A 12/17/2018   Procedure: COLONOSCOPY WITH PROPOFOL;  Surgeon: Irving Copas., MD;  Location: [place];  Service: Gastroenterology;  Laterality: N/A;   ENDOSCOPIC MUCOSAL RESECTION N/A 12/17/2018   Procedure: ENDOSCOPIC MUCOSAL RESECTION;  Surgeon: Irving Copas., MD;  Location: [place];  Service: Gastroenterology;  Laterality: N/A;   HEMOSTASIS CLIP PLACEMENT  12/17/2018   Procedure: HEMOSTASIS CLIP PLACEMENT;  Surgeon: Irving Copas., MD;  Location: [place];  Service: Gastroenterology;;   left knee scope  2003   POLYPECTOMY  12/17/2018   Procedure: POLYPECTOMY;  Surgeon: Mansouraty, Telford Nab., MD;  Location: [place];  Service: Gastroenterology;;   right knee scope  [place] INJECTION  12/17/2018   Procedure: SUBMUCOSAL LIFTING INJECTION;  Surgeon: Irving Copas., MD;  Location: [place];  Service: Gastroenterology;;   TOE SURGERY Left    metal 2nd toe- and top of foot- straighten bone   TONSILLECTOMY     TOTAL KNEE ARTHROPLASTY Left 07/06/2014   TOTAL NEPHRECTOMY Right    Family History  Problem Relation Age of Onset   Alcohol abuse Father     Hyperlipidemia Father    Hypertension Father    Diabetes Father    Heart disease Father    Cancer Maternal Aunt        lung cancer   Cancer Paternal Uncle        bone cancer   Heart attack Mother    Stroke Mother        swelling in brain stem   Healthy Daughter    Healthy Son    Healthy Daughter    Colon cancer Neg Hx    Esophageal cancer Neg Hx    Stomach cancer Neg Hx    Inflammatory bowel disease Neg Hx    Liver disease Neg Hx    Pancreatic cancer Neg Hx    Rectal cancer Neg Hx    Social History   Socioeconomic History   Marital status: Married    Spouse name: Not on file   Number of children: 3   Years of education: 16   Highest education level: Bachelor's degree (e.g., BA, AB, BS)  Occupational History   Occupation: retired Freight forwarder in [employer]  Tobacco Use   Smoking status: Former    Types: Pipe    Quit date: 11/18/1995    Years since quitting: 26.0   Smokeless tobacco: Never   Tobacco comments:    stopped 20 years ago.  1996  Vaping Use   Vaping Use: Never used  Substance and Sexual Activity   Alcohol use: Not Currently   Drug use: No   Sexual activity: Yes    Comment: lives with wife, retired from Naval architect in plant, no major dietary restrictions   Other Topics Concern   Not on file  Social History Narrative   Right Handed   Lives in a one story home   Drinks one cup of coffee a day   Social Determinants of Health   Financial Resource Strain: Low Risk  (11/27/2021)   Overall Financial Resource Strain (CARDIA)    Difficulty of Paying Living Expenses: Not hard at all  Food Insecurity: No Food Insecurity (11/27/2021)   Hunger Vital Sign    Worried About Running Out of Food in the Last Year: Never true    [place] in the Last Year: Never true  Transportation Needs: No Transportation Needs (06/11/2019)   PRAPARE - Hydrologist (Medical): No    Lack of Transportation (Non-Medical): No   Physical Activity: Insufficiently Active (11/27/2021)   Exercise Vital Sign    Days of Exercise per Week: 3 days    Minutes  of Exercise per Session: 40 min  Stress: No Stress Concern Present (11/27/2021)   [place]    Feeling of Stress : Not at all  Social Connections: Not on file    Tobacco Counseling Counseling given: Not Answered Tobacco comments: stopped 20 years ago.  1996   Clinical Intake:  Pre-visit preparation completed: Yes  Pain : No/denies pain     BMI - recorded: 27.5 Nutritional Status: BMI 25 -29 Overweight Nutritional Risks: None Diabetes: No  How often do you need to have someone help you when you read instructions, pamphlets, or other written materials from your doctor or pharmacy?: 1 - Never  Diabetic?No  Interpreter Needed?: No  Information entered by :: Caroleen Hamman LPN   Activities of Daily Living    11/27/2021    1:25 PM 12/27/2020    1:40 PM  In your present state of health, do you have any difficulty performing the following activities:  Hearing? 1 0  Comment bilateral hearing aids   Vision? 0 0  Difficulty concentrating or making decisions? 1 0  Comment sees neurology   Walking or climbing stairs? 0 0  Dressing or bathing? 0 0  Doing errands, shopping? 1 0  Preparing Food and eating ? N   Using the Toilet? N   In the past six months, have you accidently leaked urine? N   Do you have problems with loss of bowel control? N   Managing your Medications? Y   Managing your Finances? Y   Housekeeping or managing your Housekeeping? N     Patient Care Team: Mosie Lukes, MD as PCP - General (Family Medicine) Tat, Eustace Quail, DO as Consulting Physician (Neurology)  Indicate any recent Medical Services you may have received from other than [facility] providers in the past year (date may be approximate).     Assessment:   This is a routine wellness examination for  [employer].  Hearing/Vision screen Hearing Screening - Comments:: Bilateral hearing aids Vision Screening - Comments:: Last eye exam-3 months ago-Dr. Amalia Hailey  Dietary issues and exercise activities discussed: Current Exercise Habits: Home exercise routine, Type of exercise: strength training/weights;Other - see comments (exercise bike), Time (Minutes): 45, Frequency (Times/Week): 3, Weekly Exercise (Minutes/Week): 135, Intensity: Mild, Exercise limited by: None identified   Goals Addressed             This Visit's Progress    Patient Stated   On track    Remain physically active        Depression Screen    11/27/2021    1:25 PM 12/27/2020    1:40 PM 08/11/2019    3:50 PM 06/11/2019    2:27 PM 06/09/2018    8:19 AM 06/07/2017    8:26 AM 12/23/2015    8:03 AM  PHQ 2/9 Scores  PHQ - 2 Score 1 0 1 1 0 0 0    Fall Risk    11/27/2021    1:23 PM 08/29/2021   10:59 AM 01/27/2021   10:45 AM 07/28/2020   10:46 AM 01/27/2020   11:25 AM  [place] in the past year? 0 1 0 1 0  Number falls in past yr: 0 0 0 0 0  Injury with Fall? 0 0 0 0 0  Follow up Falls prevention discussed        FALL RISK PREVENTION PERTAINING TO THE HOME:  Any stairs in or around the home? No  Home free of loose throw rugs in walkways, pet beds, electrical cords, etc? Yes  Adequate lighting in your home to reduce risk of falls? Yes   ASSISTIVE DEVICES UTILIZED TO PREVENT FALLS:  Life alert? No  Use of a cane, walker or w/c? No  Grab bars in the bathroom? Yes  Shower chair or bench in shower? Yes  Elevated toilet seat or a handicapped toilet? Yes   TIMED UP AND GO:  Was the test performed? No . Phone visit   Cognitive Function: Patient has current diagnosis of cognitive impairment. Patient is followed by neurology for ongoing assessment.       06/09/2018    8:30 AM 06/07/2017    8:46 AM  MMSE - Mini Mental State Exam  Orientation to time 5 5  Orientation to Place 5 5  Registration 3 3   Attention/ Calculation 5 5  Recall 2 2  Language- name 2 objects 2 2  Language- repeat 1 1  Language- follow 3 step command 3 3  Language- read & follow direction 1 1  Write a sentence 1 1  Copy design 1 1  Total score 29 29        Immunizations Immunization History  Administered Date(s) Administered   Fluad Quad(high Dose 65+) 01/28/2020, 12/27/2020   Influenza, High Dose Seasonal PF 12/23/2015, 12/10/2016, 01/23/2018   Influenza-Unspecified 02/01/2015   PFIZER(Purple Top)SARS-COV-2 Vaccination 05/09/2019, 06/03/2019, 01/16/2020   Pfizer Covid-19 Vaccine Bivalent Booster 50yr & up 12/27/2020    TDAP status: Due, Education has been provided regarding the importance of this vaccine. Advised may receive this vaccine at local pharmacy or Health Dept. Aware to provide a copy of the vaccination record if obtained from local pharmacy or Health Dept. Verbalized acceptance and understanding.  Flu Vaccine status: Up to date  Pneumococcal vaccine status: Due, Education has been provided regarding the importance of this vaccine. Advised may receive this vaccine at local pharmacy or Health Dept. Aware to provide a copy of the vaccination record if obtained from local pharmacy or Health Dept. Verbalized acceptance and understanding.  Covid-19 vaccine status: Completed vaccines  Qualifies for Shingles Vaccine? Yes   Zostavax completed No   Shingrix Completed?: No.    Education has been provided regarding the importance of this vaccine. Patient has been advised to call insurance company to determine out of pocket expense if they have not yet received this vaccine. Advised may also receive vaccine at local pharmacy or Health Dept. Verbalized acceptance and understanding.  Screening Tests Health Maintenance  Topic Date Due   Hepatitis C Screening  Never done   TETANUS/TDAP  Never done   Zoster Vaccines- Shingrix (1 of 2) Never done   Pneumonia Vaccine 78 Years old (1 - PCV) Never done    COVID-19 Vaccine (5 - Pfizer risk series) 02/21/2021   INFLUENZA VACCINE  10/31/2021   HPV VACCINES  Aged Out   COLONOSCOPY (Pts 45-43yrInsurance coverage will need to be confirmed)  Discontinued    Health Maintenance  Health Maintenance Due  Topic Date Due   Hepatitis C Screening  Never done   TETANUS/TDAP  Never done   Zoster Vaccines- Shingrix (1 of 2) Never done   Pneumonia Vaccine [age]Years old (1 - PCV) Never done   COVID-19 Vaccine (5 - Pfizer risk series) 02/21/2021   INFLUENZA VACCINE  10/31/2021    Colorectal cancer screening: Due-patient to call GI to discuss  Lung Cancer Screening: (Low Dose CT Chest recommended if Age  55-80 years, 30 pack-year currently smoking OR have quit w/in 15years.) does not qualify.     Additional Screening:  Hepatitis C Screening: does qualify; Patient to discuss with PCP at next visit  Vision Screening: Recommended annual ophthalmology exams for early detection of glaucoma and other disorders of the eye. Is the patient up to date with their annual eye exam?  Yes  Who is the provider or what is the name of the office in which the patient attends annual eye exams? Dr. Amalia Hailey   Dental Screening: Recommended annual dental exams for proper oral hygiene  Community Resource Referral / Chronic Care Management: CRR required this visit?  No   CCM required this visit?  No      Plan:     I have personally reviewed and noted the following in the patient's chart:   Medical and social history Use of alcohol, tobacco or illicit drugs  Current medications and supplements including opioid prescriptions. Patient is not currently taking opioid prescriptions. Functional ability and status Nutritional status Physical activity Advanced directives List of other physicians Hospitalizations, surgeries, and ER visits in previous 12 months Vitals Screenings to include cognitive, depression, and falls Referrals and appointments  In addition, I  have reviewed and discussed with patient certain preventive protocols, quality metrics, and best practice recommendations. A written personalized care plan for preventive services as well as general preventive health recommendations were provided to patient.   Due to this being a telephonic visit, the after visit summary with patients personalized plan was offered to patient via mail or my-chart. Patient would like to access on my-chart.   Marta Antu, LPN   11/29/5619  Nurse health Advisor  Nurse Notes: None

## 2021-11-27 NOTE — Patient Instructions (Signed)
Vincent Walker , Thank you for taking time to complete your Medicare Wellness Visit. I appreciate your ongoing commitment to your health goals. Please review the following plan we discussed and let me know if I can assist you in the future.   Screening recommendations/referrals: Colonoscopy: Completed 12/2018-Per our conversation, you should call GI to discuss if you need to repeat colonoscopy. Recommended yearly ophthalmology/optometry visit for glaucoma screening and checkup Recommended yearly dental visit for hygiene and checkup  Vaccinations: Influenza vaccine: Up to date-Due 12/2021 Pneumococcal vaccine: Due-May obtain vaccine at our office or your local pharmacy. Tdap vaccine: Due-May obtain vaccine at your local pharmacy. Shingles vaccine: Due-May obtain vaccine at your local pharmacy. Covid-19: Up to date  Advanced directives: Please bring a copy of Living Will and/or Healthcare Power of Attorney for your chart.   Conditions/risks identified: See problem list  Next appointment: Follow up in one year for your annual wellness visit.   Preventive Care 35 Years and Older, Male Preventive care refers to lifestyle choices and visits with your health care provider that can promote health and wellness. What does preventive care include? A yearly physical exam. This is also called an annual well check. Dental exams once or twice a year. Routine eye exams. Ask your health care provider how often you should have your eyes checked. Personal lifestyle choices, including: Daily care of your teeth and gums. Regular physical activity. Eating a healthy diet. Avoiding tobacco and drug use. Limiting alcohol use. Practicing safe sex. Taking low doses of aspirin every day. Taking vitamin and mineral supplements as recommended by your health care provider. What happens during an annual well check? The services and screenings done by your health care provider during your annual well check will depend  on your age, overall health, lifestyle risk factors, and family history of disease. Counseling  Your health care provider may ask you questions about your: Alcohol use. Tobacco use. Drug use. Emotional well-being. Home and relationship well-being. Sexual activity. Eating habits. History of falls. Memory and ability to understand (cognition). Work and work Statistician. Screening  You may have the following tests or measurements: Height, weight, and BMI. Blood pressure. Lipid and cholesterol levels. These may be checked every 5 years, or more frequently if you are over 63 years old. Skin check. Lung cancer screening. You may have this screening every year starting at age 62 if you have a 30-pack-year history of smoking and currently smoke or have quit within the past 15 years. Fecal occult blood test (FOBT) of the stool. You may have this test every year starting at age 82. Flexible sigmoidoscopy or colonoscopy. You may have a sigmoidoscopy every 5 years or a colonoscopy every 10 years starting at age 24. Prostate cancer screening. Recommendations will vary depending on your family history and other risks. Hepatitis C blood test. Hepatitis B blood test. Sexually transmitted disease (STD) testing. Diabetes screening. This is done by checking your blood sugar (glucose) after you have not eaten for a while (fasting). You may have this done every 1-3 years. Abdominal aortic aneurysm (AAA) screening. You may need this if you are a current or former smoker. Osteoporosis. You may be screened starting at age 23 if you are at high risk. Talk with your health care provider about your test results, treatment options, and if necessary, the need for more tests. Vaccines  Your health care provider may recommend certain vaccines, such as: Influenza vaccine. This is recommended every year. Tetanus, diphtheria, and acellular pertussis (Tdap,  Td) vaccine. You may need a Td booster every 10  years. Zoster vaccine. You may need this after age 65. Pneumococcal 13-valent conjugate (PCV13) vaccine. One dose is recommended after age 3. Pneumococcal polysaccharide (PPSV23) vaccine. One dose is recommended after age 59. Talk to your health care provider about which screenings and vaccines you need and how often you need them. This information is not intended to replace advice given to you by your health care provider. Make sure you discuss any questions you have with your health care provider. Document Released: 04/15/2015 Document Revised: 12/07/2015 Document Reviewed: 01/18/2015 Elsevier Interactive Patient Education  2017 [place] Prevention in the Home Falls can cause injuries. They can happen to people of all ages. There are many things you can do to make your home safe and to help prevent falls. What can I do on the outside of my home? Regularly fix the edges of walkways and driveways and fix any cracks. Remove anything that might make you trip as you walk through a door, such as a raised step or threshold. Trim any bushes or trees on the path to your home. Use bright outdoor lighting. Clear any walking paths of anything that might make someone trip, such as rocks or tools. Regularly check to see if handrails are loose or broken. Make sure that both sides of any steps have handrails. Any raised decks and porches should have guardrails on the edges. Have any leaves, snow, or ice cleared regularly. Use sand or salt on walking paths during winter. Clean up any spills in your garage right away. This includes oil or grease spills. What can I do in the bathroom? Use night lights. Install grab bars by the toilet and in the tub and shower. Do not use towel bars as grab bars. Use non-skid mats or decals in the tub or shower. If you need to sit down in the shower, use a plastic, non-slip stool. Keep the floor dry. Clean up any water that spills on the floor as soon as it  happens. Remove soap buildup in the tub or shower regularly. Attach bath mats securely with double-sided non-slip rug tape. Do not have throw rugs and other things on the floor that can make you trip. What can I do in the bedroom? Use night lights. Make sure that you have a light by your bed that is easy to reach. Do not use any sheets or blankets that are too big for your bed. They should not hang down onto the floor. Have a firm chair that has side arms. You can use this for support while you get dressed. Do not have throw rugs and other things on the floor that can make you trip. What can I do in the kitchen? Clean up any spills right away. Avoid walking on wet floors. Keep items that you use a lot in easy-to-reach places. If you need to reach something above you, use a strong step stool that has a grab bar. Keep electrical cords out of the way. Do not use floor polish or wax that makes floors slippery. If you must use wax, use non-skid floor wax. Do not have throw rugs and other things on the floor that can make you trip. What can I do with my stairs? Do not leave any items on the stairs. Make sure that there are handrails on both sides of the stairs and use them. Fix handrails that are broken or loose. Make sure that handrails are as  long as the stairways. Check any carpeting to make sure that it is firmly attached to the stairs. Fix any carpet that is loose or worn. Avoid having throw rugs at the top or bottom of the stairs. If you do have throw rugs, attach them to the floor with carpet tape. Make sure that you have a light switch at the top of the stairs and the bottom of the stairs. If you do not have them, ask someone to add them for you. What else can I do to help prevent falls? Wear shoes that: Do not have high heels. Have rubber bottoms. Are comfortable and fit you well. Are closed at the toe. Do not wear sandals. If you use a stepladder: Make sure that it is fully opened.  Do not climb a closed stepladder. Make sure that both sides of the stepladder are locked into place. Ask someone to hold it for you, if possible. Clearly mark and make sure that you can see: Any grab bars or handrails. First and last steps. Where the edge of each step is. Use tools that help you move around (mobility aids) if they are needed. These include: Canes. Walkers. Scooters. Crutches. Turn on the lights when you go into a dark area. Replace any light bulbs as soon as they burn out. Set up your furniture so you have a clear path. Avoid moving your furniture around. If any of your floors are uneven, fix them. If there are any pets around you, be aware of where they are. Review your medicines with your doctor. Some medicines can make you feel dizzy. This can increase your chance of falling. Ask your doctor what other things that you can do to help prevent falls. This information is not intended to replace advice given to you by your health care provider. Make sure you discuss any questions you have with your health care provider. Document Released: 01/13/2009 Document Revised: 08/25/2015 Document Reviewed: 04/23/2014 Elsevier Interactive Patient Education  2017 [employer].

## 2021-12-05 ENCOUNTER — Other Ambulatory Visit (HOSPITAL_BASED_OUTPATIENT_CLINIC_OR_DEPARTMENT_OTHER): Payer: Self-pay

## 2021-12-06 ENCOUNTER — Other Ambulatory Visit (HOSPITAL_BASED_OUTPATIENT_CLINIC_OR_DEPARTMENT_OTHER): Payer: Self-pay

## 2021-12-10 ENCOUNTER — Other Ambulatory Visit: Payer: Self-pay | Admitting: Medical

## 2021-12-11 ENCOUNTER — Other Ambulatory Visit (HOSPITAL_BASED_OUTPATIENT_CLINIC_OR_DEPARTMENT_OTHER): Payer: Self-pay

## 2021-12-11 MED ORDER — ATENOLOL 25 MG PO TABS
25.0000 mg | ORAL_TABLET | Freq: Every evening | ORAL | 3 refills | Status: DC
  Filled 2021-12-11: qty 90, 90d supply, fill #0
  Filled 2022-03-18: qty 90, 90d supply, fill #1
  Filled 2022-05-28: qty 65, 65d supply, fill #2
  Filled 2022-08-15: qty 65, 65d supply, fill #3

## 2021-12-20 ENCOUNTER — Other Ambulatory Visit: Payer: Self-pay | Admitting: Medical

## 2021-12-20 ENCOUNTER — Other Ambulatory Visit (HOSPITAL_BASED_OUTPATIENT_CLINIC_OR_DEPARTMENT_OTHER): Payer: Self-pay

## 2021-12-20 MED ORDER — ATORVASTATIN CALCIUM 20 MG PO TABS
20.0000 mg | ORAL_TABLET | Freq: Every day | ORAL | 1 refills | Status: DC
  Filled 2021-12-20: qty 90, 90d supply, fill #0
  Filled 2022-03-11: qty 90, 90d supply, fill #1

## 2022-01-11 DIAGNOSIS — H04123 Dry eye syndrome of bilateral lacrimal glands: Secondary | ICD-10-CM | POA: Diagnosis not present

## 2022-01-11 DIAGNOSIS — H0100B Unspecified blepharitis left eye, upper and lower eyelids: Secondary | ICD-10-CM | POA: Diagnosis not present

## 2022-01-11 DIAGNOSIS — H401112 Primary open-angle glaucoma, right eye, moderate stage: Secondary | ICD-10-CM | POA: Diagnosis not present

## 2022-01-11 DIAGNOSIS — Z961 Presence of intraocular lens: Secondary | ICD-10-CM | POA: Diagnosis not present

## 2022-01-11 DIAGNOSIS — Z83511 Family history of glaucoma: Secondary | ICD-10-CM | POA: Diagnosis not present

## 2022-01-11 DIAGNOSIS — H401123 Primary open-angle glaucoma, left eye, severe stage: Secondary | ICD-10-CM | POA: Diagnosis not present

## 2022-01-11 DIAGNOSIS — H0100A Unspecified blepharitis right eye, upper and lower eyelids: Secondary | ICD-10-CM | POA: Diagnosis not present

## 2022-01-15 ENCOUNTER — Other Ambulatory Visit: Payer: Self-pay | Admitting: Neurology

## 2022-01-15 ENCOUNTER — Other Ambulatory Visit (HOSPITAL_BASED_OUTPATIENT_CLINIC_OR_DEPARTMENT_OTHER): Payer: Self-pay

## 2022-01-15 DIAGNOSIS — G20B1 Parkinson's disease with dyskinesia, without mention of fluctuations: Secondary | ICD-10-CM

## 2022-01-16 ENCOUNTER — Other Ambulatory Visit (HOSPITAL_BASED_OUTPATIENT_CLINIC_OR_DEPARTMENT_OTHER): Payer: Self-pay

## 2022-01-16 MED ORDER — CARBIDOPA-LEVODOPA 25-100 MG PO TABS
ORAL_TABLET | ORAL | 0 refills | Status: DC
  Filled 2022-01-16: qty 630, 90d supply, fill #0

## 2022-01-17 ENCOUNTER — Other Ambulatory Visit (HOSPITAL_BASED_OUTPATIENT_CLINIC_OR_DEPARTMENT_OTHER): Payer: Self-pay

## 2022-01-17 MED ORDER — FLUAD QUADRIVALENT 0.5 ML IM PRSY
PREFILLED_SYRINGE | INTRAMUSCULAR | 0 refills | Status: DC
  Filled 2022-01-17: qty 0.5, 1d supply, fill #0

## 2022-01-17 MED ORDER — COMIRNATY 30 MCG/0.3ML IM SUSY
PREFILLED_SYRINGE | INTRAMUSCULAR | 0 refills | Status: DC
  Filled 2022-01-17: qty 0.3, 1d supply, fill #0

## 2022-01-29 ENCOUNTER — Other Ambulatory Visit (HOSPITAL_BASED_OUTPATIENT_CLINIC_OR_DEPARTMENT_OTHER): Payer: Self-pay

## 2022-01-30 ENCOUNTER — Other Ambulatory Visit (HOSPITAL_BASED_OUTPATIENT_CLINIC_OR_DEPARTMENT_OTHER): Payer: Self-pay

## 2022-02-05 ENCOUNTER — Other Ambulatory Visit: Payer: Self-pay | Admitting: Neurology

## 2022-02-05 ENCOUNTER — Other Ambulatory Visit (HOSPITAL_BASED_OUTPATIENT_CLINIC_OR_DEPARTMENT_OTHER): Payer: Self-pay

## 2022-02-05 MED ORDER — DIVALPROEX SODIUM ER 250 MG PO TB24
250.0000 mg | ORAL_TABLET | Freq: Every day | ORAL | 0 refills | Status: DC
  Filled 2022-02-05: qty 30, 30d supply, fill #0

## 2022-02-19 ENCOUNTER — Other Ambulatory Visit: Payer: Self-pay | Admitting: Neurology

## 2022-02-19 DIAGNOSIS — G20A1 Parkinson's disease without dyskinesia, without mention of fluctuations: Secondary | ICD-10-CM

## 2022-02-20 ENCOUNTER — Other Ambulatory Visit (HOSPITAL_BASED_OUTPATIENT_CLINIC_OR_DEPARTMENT_OTHER): Payer: Self-pay

## 2022-02-20 MED ORDER — CLONAZEPAM 0.5 MG PO TABS
0.5000 mg | ORAL_TABLET | Freq: Every day | ORAL | 0 refills | Status: DC
  Filled 2022-02-20: qty 30, 30d supply, fill #0

## 2022-02-20 MED ORDER — MIRTAZAPINE 15 MG PO TABS
15.0000 mg | ORAL_TABLET | Freq: Every day | ORAL | 0 refills | Status: DC
  Filled 2022-02-20: qty 90, 90d supply, fill #0

## 2022-03-02 NOTE — Progress Notes (Unsigned)
Assessment/Plan:   1.  Parkinsons Disease  -take  carbidopa/levodopa 25/100 IR, 2 at 8am/2 at 11am/2 at 2pm/1 at 6pm.  He does take an extra prn RLS  -Continue carbidopa/levodopa 50/200 at bedtime 2.  RBD/RLS/insomnia  -Continue clonazepam 0.5 mg, full tablet at bedtime.  Tried to stop this medication and things got much worse, especially mood change and daytime hypersomnolence did not get better.  Lower dosages resulted in increasing RBD symptoms.  PDMP is reviewed.  Last filled November 21.  -Unfortunately, he continues to have restless leg, but medicines really cannot be increased further.  He is already on extended release levodopa and clonazepam at bedtime.  I really do not want to add a opioids.  -on mirtazapine, 15 mg q hs.    -some of night issues due to nocturia.  Needs to f/u with PCP/urology 3.  Anxiety/depression  -Being followed by primary care.  On Depakote ER, 250 mg daily.   4.  Daytime hypersomnolence  -Nocturnal polysomnogram was negative, but he was awake most of the night so not sure that it was accurate 5.  MCI  -Neurocognitive testing done in July, 2022 was unremarkable.  Pt/Patients wife feels that cognitively he is getting slower/worse.  Discussed that we could always repeat this test, but last year he just had MCI.  We will schedule repeat.  -Offered donepezil and discussed that we would need to watch for bradycardia, especially given he is already on a beta-blocker.  Ultimately, patient declined.  -Patient would really like to go back to driving.  Dr. Melvyn Novas felt that neurocognitive test was not a great predictor of an individual's ability to safely operate a motor vehicle.  He did recommend a driving evaluation, which I agree with. 6.  History of hypertension  -On 2 antihypertensives.  We will need to watch this given autonomic instability associated with Parkinson's disease.   Subjective:   Vincent Walker was seen today in follow up for Parkinsons disease.  My  previous records were reviewed prior to todays visit as well as outside records available to me. Pt with wife who supplements hx.  Current prescribed movement disorder medications: carbidopa/levodopa 25/100 , 2 at 6am/2 at 10am/2 at 2pm/1 at 6pm (an increase, but also switched back from the CR) Clonazepam 0.5 mg, 1 tablet at bedtime (an increase) carbidopa/levodopa 50/200 at bedtime Mirtazapine, 15 mg nightly Depakote, 250 mg daily  PREVIOUS MEDICATIONS: Carbidopa/levodopa 25/100 IR; carbidopa/levodopa 25/100 CR (tried to change to see if would help EDS and didn't so changed back)  ALLERGIES:   Allergies  Allergen Reactions   Nsaids    Sulfa Antibiotics Other (See Comments)    Had a reaction as a child   Tolmetin Other (See Comments)    CURRENT MEDICATIONS:  Outpatient Encounter Medications as of 03/05/2022  Medication Sig   acetaminophen (TYLENOL) 500 MG tablet Take 1,000 mg by mouth every 6 (six) hours as needed (pain.).    amLODipine (NORVASC) 2.5 MG tablet Take 1/2 - 1 tablets (1.25-2.5 mg total) by mouth daily. (Patient taking differently: Take 1.25 mg by mouth daily.)   aspirin EC 81 MG tablet Take 81 mg by mouth at bedtime. (Patient not taking: Reported on 09/26/2021)   atenolol (TENORMIN) 25 MG tablet Take 1 tablet (25 mg total) by mouth every evening.   atorvastatin (LIPITOR) 20 MG tablet Take 1 tablet (20 mg total) by mouth daily.   bimatoprost (LUMIGAN) 0.01 % SOLN Place 1 drop into both eyes nightly. (  Patient not taking: Reported on 09/26/2021)   brimonidine (ALPHAGAN) 0.2 % ophthalmic solution Place 1 drop into both eyes 2 (two) times daily. (Patient not taking: Reported on 09/26/2021)   brimonidine (ALPHAGAN) 0.2 % ophthalmic solution Place 1 drop into both eyes 2 (two) times daily.   Brinzolamide-Brimonidine (SIMBRINZA) 1-0.2 % SUSP Place 1 drop into both eyes 3 times daily.   carbidopa-levodopa (SINEMET CR) 50-200 MG tablet TAKE 1 TABLET BY MOUTH AT BEDTIME.    carbidopa-levodopa (SINEMET IR) 25-100 MG tablet Take 2 tablets by mouth at 8am, 2 tabs at 11am, 2 tabs at 2pm, then 1 tab at 6pm as directed   clonazePAM (KLONOPIN) 0.5 MG tablet Take 1 tablet (0.5 mg total) by mouth at bedtime.   COVID-19 mRNA bivalent vaccine, Pfizer, injection Inject into the muscle. (Patient not taking: Reported on 09/26/2021)   COVID-19 mRNA vaccine 2023-2024 (COMIRNATY) syringe Inject into the muscle.   divalproex (DEPAKOTE ER) 250 MG 24 hr tablet Take 1 tablet (250 mg total) by mouth daily.   fluorouracil (EFUDEX) 5 % cream Apply twice daily to scalp 2-3weeks to point of irritation   fluorouracil (EFUDEX) 5 % cream Apply to the nose 2 times daily for 3 weeks as tolerated   fluticasone (CUTIVATE) 0.05 % cream Apply twice a day to affected areas on face for 1 week on 1 week off as needed for flares   influenza vaccine adjuvanted (FLUAD QUADRIVALENT) 0.5 ML injection Inject into the muscle.   influenza vaccine adjuvanted (FLUAD) 0.5 ML injection Inject into the muscle. (Patient not taking: Reported on 09/26/2021)   mirtazapine (REMERON) 15 MG tablet Take 1 tablet (15 mg total) by mouth at bedtime.   Multiple Vitamins-Minerals (PRESERVISION AREDS 2 PO) Take 1 tablet by mouth daily.   Polyethyl Glycol-Propyl Glycol (LUBRICANT EYE DROPS) 0.4-0.3 % SOLN Place 1 drop into both eyes 3 (three) times daily as needed (dry/irritated eyes.).   sodium chloride (OCEAN) 0.65 % SOLN nasal spray Place 1 spray into both nostrils as needed for congestion. (Patient not taking: Reported on 09/26/2021)   Travoprost, BAK Free, (TRAVATAN Z) 0.004 % SOLN ophthalmic solution Instill one drop into both eyes at night   No facility-administered encounter medications on file as of 03/05/2022.    Objective:   PHYSICAL EXAMINATION:    VITALS:   There were no vitals filed for this visit.     GEN:  The patient appears stated age and is in NAD. HEENT:  Normocephalic, atraumatic.  The mucous membranes  are moist. The superficial temporal arteries are without ropiness or tenderness. CV:  RRR Lungs:  CTAB Neck/HEME:  There are no carotid bruits bilaterally.  Neurological examination:  Orientation: The patient is alert and oriented x3. Cranial nerves: There is good facial symmetry with facial hypomimia. The speech is fluent and clear. Soft palate rises symmetrically and there is no tongue deviation. Hearing is intact to conversational tone. Sensation: Sensation is intact to light touch throughout Motor: Strength is at least antigravity x4.  Movement examination: Tone: There is normal tone today in the upper and lower extremities. Abnormal movements: there is RUE rest tremor, intermittent Coordination:  There is mild decremation, with any form of RAMS, including alternating supination and pronation of the forearm, hand opening and closing, finger taps, heel taps and toe taps, R>L Gait and Station: The patient has min difficulty arising out of a deep-seated chair without the use of the hands. The patient's stride length is slightly decreased and he drags the  right leg a bit  I have reviewed and interpreted the following labs independently    Chemistry      Component Value Date/Time   NA 139 12/27/2020 1443   K 4.5 12/27/2020 1443   CL 104 12/27/2020 1443   CO2 27 12/27/2020 1443   BUN 17 12/27/2020 1443   CREATININE 1.40 12/27/2020 1443      Component Value Date/Time   CALCIUM 10.0 12/27/2020 1443   ALKPHOS 82 12/27/2020 1443   AST 21 12/27/2020 1443   ALT 12 12/27/2020 1443   BILITOT 0.5 12/27/2020 1443       Lab Results  Component Value Date   WBC 9.2 08/28/2019   HGB 14.0 08/28/2019   HCT 41.8 08/28/2019   MCV 92.1 08/28/2019   PLT 190.0 08/28/2019    Lab Results  Component Value Date   TSH 2.45 04/06/2019     Total time spent on today's visit was *** minutes, including both face-to-face time and nonface-to-face time.  Time included that spent on review of  records (prior notes available to me/labs/imaging if pertinent), discussing treatment and goals, answering patient's questions and coordinating care.  Cc:  Mosie Lukes, MD

## 2022-03-04 ENCOUNTER — Encounter: Payer: Self-pay | Admitting: Neurology

## 2022-03-05 ENCOUNTER — Other Ambulatory Visit (HOSPITAL_BASED_OUTPATIENT_CLINIC_OR_DEPARTMENT_OTHER): Payer: Self-pay

## 2022-03-05 ENCOUNTER — Ambulatory Visit (INDEPENDENT_AMBULATORY_CARE_PROVIDER_SITE_OTHER): Payer: Medicare Other | Admitting: Neurology

## 2022-03-05 VITALS — BP 124/82 | HR 60 | Ht 75.0 in | Wt 216.6 lb

## 2022-03-05 DIAGNOSIS — G20B1 Parkinson's disease with dyskinesia, without mention of fluctuations: Secondary | ICD-10-CM | POA: Diagnosis not present

## 2022-03-05 DIAGNOSIS — G20A1 Parkinson's disease without dyskinesia, without mention of fluctuations: Secondary | ICD-10-CM | POA: Diagnosis not present

## 2022-03-05 DIAGNOSIS — F02A18 Dementia in other diseases classified elsewhere, mild, with other behavioral disturbance: Secondary | ICD-10-CM

## 2022-03-05 MED ORDER — CLONAZEPAM 0.5 MG PO TABS
0.5000 mg | ORAL_TABLET | Freq: Every day | ORAL | 4 refills | Status: DC
  Filled 2022-03-05 – 2022-03-20 (×3): qty 30, 30d supply, fill #0
  Filled 2022-04-17: qty 30, 30d supply, fill #1
  Filled 2022-05-28: qty 30, 30d supply, fill #2
  Filled 2022-06-25: qty 30, 30d supply, fill #3
  Filled 2022-07-30: qty 30, 30d supply, fill #4

## 2022-03-05 MED ORDER — CARBIDOPA-LEVODOPA 25-100 MG PO TABS
ORAL_TABLET | ORAL | 2 refills | Status: DC
  Filled 2022-03-05: qty 630, fill #0
  Filled 2022-04-10: qty 630, 90d supply, fill #0
  Filled 2022-07-09: qty 630, 90d supply, fill #1
  Filled 2022-10-08: qty 630, 90d supply, fill #2

## 2022-03-05 MED ORDER — DIVALPROEX SODIUM ER 250 MG PO TB24
250.0000 mg | ORAL_TABLET | Freq: Every day | ORAL | 2 refills | Status: DC
  Filled 2022-03-05: qty 90, 90d supply, fill #0
  Filled 2022-05-28: qty 75, 75d supply, fill #1
  Filled 2022-08-13: qty 75, 75d supply, fill #2
  Filled 2022-08-25: qty 75, 75d supply, fill #3

## 2022-03-05 MED ORDER — MIRTAZAPINE 15 MG PO TABS
15.0000 mg | ORAL_TABLET | Freq: Every day | ORAL | 2 refills | Status: DC
  Filled 2022-03-05 – 2022-05-21 (×2): qty 90, 90d supply, fill #0
  Filled 2022-08-13: qty 90, 90d supply, fill #1
  Filled 2022-11-12: qty 90, 90d supply, fill #2

## 2022-03-05 MED ORDER — CARBIDOPA-LEVODOPA ER 50-200 MG PO TBCR
1.0000 | EXTENDED_RELEASE_TABLET | Freq: Every day | ORAL | 2 refills | Status: DC
  Filled 2022-03-05 – 2022-04-17 (×2): qty 90, 90d supply, fill #0
  Filled 2022-07-10: qty 90, 90d supply, fill #1
  Filled 2022-10-08: qty 90, 90d supply, fill #2

## 2022-03-05 NOTE — Patient Instructions (Signed)
Local and Online Resources for Power over Parkinson's Group  November 2023    LOCAL [place] PARKINSON'S GROUPS   Power over Parkinson's Group:    Power Over Parkinson's Patient Education Group will be Wednesday, November 8th-*Hybrid meting*- in person at [facility] location and via [facility], 2:00-3:00 pm.   Starting in November, Power over [employer] and Care Partner Groups will meet together, with plans for separate break out session for caregivers (*this will be evolving over the next few months) Upcoming Power over Parkinson's Meetings/Care Partner Support:  2nd Wednesdays of the month at 2 pm:   November 8th, December 13th  [place] at [email or web address] if interested in participating in this group    [place] and Fall Prevention Workshop.  Thursday, November 9th 1-2pm, [employer], [employer].  Register with Vonna Kotyk at [email or web address] or [phone] New PWR! Moves [employer] Instructor-Led Classes offering at [employer]!  TUESDAYS and Wednesdays 1-2 pm.   Contact Vonna Kotyk at  [email or web address]  or [phone] (Tuesday classes are modified for chair and standing only) Dance for Parkinson 's classes will be on Tuesdays 9:30am-10:30am starting October 3-December 12 with a break the week of November 21st. Located in the [employer] , in the first floor of the [employer] ([place].) To register:  [email or web address] or [phone]  Drumming for Parkinson's will be held on 2nd and 4th Mondays at 11:00 am.   Located at the [place] ([place]  [place] at [email or web address] or [phone]  Through support from the [place] for Parkinson's classes are free for both patients and caregivers.    [employer] Parkinson's Tai Chi  Class, Mondays at 11 am.  Call [phone] for details Parkinson's Holiday Party.  Wednesday, December 6th, 4:00-5:00 pm.  [facility] and Fitness.  RSVP to Garnetta Buddy at [phone] or [email or web address]   [place]:  [email or web address]  PD Health at Home continues:  Mindfulness Mondays, Wellness Wednesdays, Fitness Fridays   Upcoming Education:   Why Should you Participate in Parkinson's Research?  Wednesday, Nov. 29th,  1-2 pm  Expert Briefing:    Hallucinations and Delusions in Parkinson's.  Wednesday, Nov. 8th, 1-2 pm  Register for expert briefings (webinars) at [email or web address]  Please check out their website to sign up for emails and see their full online offerings      [place]:  [email or web address]   Third Thursday Webinars:  On the third Thursday of every month at 12 p.m. ET, join our free live webinars to learn about various aspects of living with Parkinson's disease and our work to speed medical breakthroughs.  Upcoming Webinar:  A Year Like No Other in Parkinson's Research:  2023 in Review.  Thursday, November 16th 12 noon. Check out additional information on their website to see their full online offerings    [facility]:  [email or web address]  Upcoming Webinar:   Stay tuned  Webinar Series:  Living with Parkinson's Meetup.   Third Thursdays each month, 3 pm  Care Partner Monthly Meetup.  With Robin Searing Phinney.  First Tuesday of each month, 2 pm  Check out additional information to Live Well Today on their website    Parkinson and Movement Disorders (PMD) Alliance:  [email or web address]  [employer]:  Online Education Events  Sign up for emails, which are sent weekly to give  you updates on programming and online offerings    Parkinson's Association of the [place]:  [email or web address]   Information on online support groups, education events, and online exercises including Yoga, Parkinson's exercises and more-LOTS of information on links to PD resources and online events  Virtual Support Group through Parkinson's Association of the [place]; next one is scheduled for Wednesday, November 1st  at 2 pm.  (These are typically scheduled for the 1st Wednesday of the month at 2 pm).  Visit website for details.   MOVEMENT AND EXERCISE OPPORTUNITIES  PWR! Moves Classes at [place].  Wednesdays 10 and 11 am.   Contact Amy Marriott, PT [email or web address] if interested.  NEW PWR! Moves Class offerings at [employer].  *TUESDAYS* and Wednesdays 1-2 pm.  Contact Vonna Kotyk at  [email or web address]    Parkinson's Wellness Recovery (PWR! Moves)  [email or web address]  Info on the PWR! Virtual Experience:  You will have access to our expertise?through self-assessment, guided plans that start with the PD-specific fundamentals, educational content, tips, Q&A with an expert, and a growing Art therapist of PD-specific pre-recorded and live exercise classes of varying types and intensity - both physical and cognitive! If that is not enough, we offer 1:1 wellness consultations (in-person or virtual) to personalize your PWR! Research scientist (medical).   [place] Fridays:   As part of the PD Health @ Home program, this free video series focuses each week on one aspect of fitness designed to support people living with Parkinson's.? These weekly videos highlight the [place] fitness guidelines for people with Parkinson's disease.  [email or web address]   Dance for PD website is offering free, live-stream classes throughout the week, as well as links to [employer] of classes:  [email or web address]  Virtual dance and Pilates for Parkinson's classes: Click on the Community Tab> Parkinson's  Movement Initiative Tab.  To register for classes and for more information, visit [email or web address] and click the "community" tab.   [employer] Parkinson's Cycling Classes   [employer]:  Thursdays @ Noon-Live classes at [employer] ([employer] at [email or web address]?or [phone])  [facility]: Virtual Classes Mondays and Thursdays Jeanette Caprice classes Tuesday, Wednesday and Thursday (contact [place] at [email or web address] ?or [phone])  [place]  Varied levels of classes are offered Tuesdays and Thursdays at [employer].   Stretching with Verdis Frederickson weekly class is also offered for people with Parkinson's  To observe a class or for more information, call [phone] or email Hezzie Bump at [email or web address]   ADDITIONAL SUPPORT AND RESOURCES  [employer]:Online Caregiver Education Opportunities:  [email or web address]/[email or web address]/[email or web address]-group.  You may also contact Vickki Muff at [email or web address] or [phone].     Well-Spring Navigator:  Just1Navigator program, a?free service to help individuals and families through the journey of determining care for older adults.  The "Navigator" is a Education officer, museum, Arnell Asal, who will speak with a prospective client and/or loved ones to provide an assessment of the situation and a set of recommendations for a personalized care plan -- all free of charge, and whether?[employer] offers the needed service or not. If the need is not a service we provide, we are well-connected with reputable programs in town that we can refer you to.  www.well-[email or web address] or to speak with the Navigator, call [phone].

## 2022-03-06 ENCOUNTER — Ambulatory Visit: Payer: Medicare Other | Admitting: Neurology

## 2022-03-14 DIAGNOSIS — Z83511 Family history of glaucoma: Secondary | ICD-10-CM | POA: Diagnosis not present

## 2022-03-14 DIAGNOSIS — H04123 Dry eye syndrome of bilateral lacrimal glands: Secondary | ICD-10-CM | POA: Diagnosis not present

## 2022-03-14 DIAGNOSIS — H401123 Primary open-angle glaucoma, left eye, severe stage: Secondary | ICD-10-CM | POA: Diagnosis not present

## 2022-03-14 DIAGNOSIS — H0100B Unspecified blepharitis left eye, upper and lower eyelids: Secondary | ICD-10-CM | POA: Diagnosis not present

## 2022-03-14 DIAGNOSIS — H401112 Primary open-angle glaucoma, right eye, moderate stage: Secondary | ICD-10-CM | POA: Diagnosis not present

## 2022-03-14 DIAGNOSIS — H0100A Unspecified blepharitis right eye, upper and lower eyelids: Secondary | ICD-10-CM | POA: Diagnosis not present

## 2022-03-19 ENCOUNTER — Other Ambulatory Visit (HOSPITAL_BASED_OUTPATIENT_CLINIC_OR_DEPARTMENT_OTHER): Payer: Self-pay

## 2022-03-19 ENCOUNTER — Other Ambulatory Visit: Payer: Self-pay

## 2022-03-20 ENCOUNTER — Other Ambulatory Visit (HOSPITAL_BASED_OUTPATIENT_CLINIC_OR_DEPARTMENT_OTHER): Payer: Self-pay

## 2022-03-20 MED ORDER — SIMBRINZA 1-0.2 % OP SUSP
1.0000 [drp] | Freq: Three times a day (TID) | OPHTHALMIC | 11 refills | Status: DC
  Filled 2022-03-20: qty 8, 54d supply, fill #0
  Filled 2022-05-01: qty 8, 30d supply, fill #1
  Filled 2022-05-28: qty 8, 30d supply, fill #2
  Filled 2022-06-25: qty 8, 30d supply, fill #3
  Filled 2022-07-24: qty 8, 30d supply, fill #4
  Filled 2022-08-23: qty 8, 30d supply, fill #5
  Filled 2022-09-19: qty 8, 30d supply, fill #6
  Filled 2022-10-18: qty 8, 30d supply, fill #7
  Filled 2022-11-14: qty 8, 30d supply, fill #8
  Filled 2022-12-11: qty 8, 30d supply, fill #9
  Filled 2023-01-09: qty 8, 30d supply, fill #10
  Filled 2023-02-06: qty 8, 30d supply, fill #11

## 2022-03-21 ENCOUNTER — Other Ambulatory Visit (HOSPITAL_BASED_OUTPATIENT_CLINIC_OR_DEPARTMENT_OTHER): Payer: Self-pay

## 2022-04-04 ENCOUNTER — Other Ambulatory Visit: Payer: Self-pay | Admitting: Family Medicine

## 2022-04-04 ENCOUNTER — Other Ambulatory Visit (HOSPITAL_BASED_OUTPATIENT_CLINIC_OR_DEPARTMENT_OTHER): Payer: Self-pay

## 2022-04-09 NOTE — Therapy (Signed)
OUTPATIENT PHYSICAL THERAPY PARKINSON'S DISEASE SCREEN   Patient Name: Vincent Walker MRN: [id number] DOB:04/08/1945, 77 y.o., male Today's Date: 04/10/2022   PT End of Session - 04/10/22 1309     Visit Number 0    PT Start Time 9628    PT Stop Time 3662    PT Time Calculation (min) 40 min    Activity Tolerance Patient tolerated treatment well    Behavior During Therapy [facility] for tasks assessed/performed             Past Medical History:  Diagnosis Date   Adenomatous polyp of ascending colon 10/09/2018   Allergies 07/07/2018   Arthritis    Benign essential hypertension 02/08/2015   Chronic renal insufficiency 02/14/2015   Constipation 12/23/2015   Dyslipidemia 02/08/2015   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 Required nasal packing a couple of days ago.  Had a similar episode of epistaxis a little over a year ago.  At that visit, I could not see the exact bleeding spot. EXAMINATION after packing removal reveals several excoriated areas anteriorly but I was unable to see t   Glaucoma    History of blood transfusion 2010   After Kidney surgery   History of chicken pox    History of renal cell carcinoma 02/08/2015   diagnosed and removed in 2010 Right Monitored by Dr Tresa Endo of urology at [facility] Dr Richarda Overlie, nephrology at [facility]   Hyperglycemia 12/23/2015   Hyperlipidemia    Increased thyroid stimulating hormone (TSH) level 06/07/2017   Ingrown left big toenail 02/14/2015   Left foot pain 02/14/2015   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   MVP (mitral valve prolapse) 05/14/2016   Parkinson's disease 12/23/2015   Right hip pain 12/12/2016   Thrombocytopenia 02/08/2015   Tremor of right hand 02/14/2015   Past Surgical History:  Procedure Laterality Date   APPENDECTOMY  1995   CATARACT EXTRACTION, BILATERAL      COLONOSCOPY     COLONOSCOPY WITH PROPOFOL N/A 12/17/2018   Procedure: COLONOSCOPY WITH PROPOFOL;  Surgeon: Irving Copas., MD;  Location: [place];  Service: Gastroenterology;  Laterality: N/A;   ENDOSCOPIC MUCOSAL RESECTION N/A 12/17/2018   Procedure: ENDOSCOPIC MUCOSAL RESECTION;  Surgeon: Rush Landmark Telford Nab., MD;  Location: [place];  Service: Gastroenterology;  Laterality: N/A;   HEMOSTASIS CLIP PLACEMENT  12/17/2018   Procedure: HEMOSTASIS CLIP PLACEMENT;  Surgeon: Irving Copas., MD;  Location: [place];  Service: Gastroenterology;;   left knee scope  2003   POLYPECTOMY  12/17/2018   Procedure: POLYPECTOMY;  Surgeon: Rush Landmark Telford Nab., MD;  Location: [place];  Service: Gastroenterology;;   right knee scope  [place] INJECTION  12/17/2018   Procedure: SUBMUCOSAL LIFTING INJECTION;  Surgeon: Irving Copas., MD;  Location: [place];  Service: Gastroenterology;;   TOE SURGERY Left    metal 2nd toe- and top of foot- straighten bone   TONSILLECTOMY     TOTAL KNEE ARTHROPLASTY Left 07/06/2014   TOTAL NEPHRECTOMY Right    Patient Active Problem List   Diagnosis Date Noted   Eustachian tube dysfunction, left 02/21/2021   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   Early stage nonexudative age-related macular degeneration of both eyes 03/09/2019   Adenomatous polyp of ascending colon 10/09/2018   Epistaxis 07/07/2018   Allergies 07/07/2018   Obesity 06/15/2018   Increased  thyroid stimulating hormone (TSH) level 06/07/2017   Right hip pain 12/12/2016   MVP (mitral valve prolapse) 05/14/2016   Parkinson's disease 12/23/2015   Constipation 12/23/2015   Hyperglycemia 12/23/2015   Tremor of right hand 02/14/2015   Left foot pain 02/14/2015   Chronic renal insufficiency 02/14/2015   Benign essential hypertension 02/08/2015   Dyslipidemia 02/08/2015   History of renal cell carcinoma 02/08/2015    Thrombocytopenia 02/08/2015   History of chicken pox     PCP: Mosie Lukes, MD  REFERRING PROVIDER: Ludwig Clarks, DO   REFERRING DIAG: G20 (ICD-10-CM) - Parkinson's disease ([place])   RATIONALE FOR EVALUATION AND TREATMENT: Rehabilitation  THERAPY DIAG:  Other abnormalities of gait and mobility  ONSET DATE: PD diagnosis 3+ years ago   MOST RECENT PT EPISODE FOR PD: 02/27/21 - 04/06/21; PD screen - 09/26/21  NEXT MD VISIT:  08/16/22   SUBJECTIVE:                                                                                                                                                                                           SUBJECTIVE STATEMENT: Pt reports worsening of his word finding of late. Also notes worsening of the stiffness in his knees - not sure if due to PD or "just old age". He states he has been keeping up with his HEP from his last PT episode most days.  Pt accompanied by: significant other - in waiting room  PAIN:  Are you having pain? No  PERTINENT HISTORY:  Parkinson's disease, mild neurocognitive disorder due to PD, HTN, MVP, L TKA, R knee scope, CRI, h/o renal cancer with R nephrectomy, thrombocytopenia, dyslipidemia, B macular degeneration    PRECAUTIONS: None  WEIGHT BEARING RESTRICTIONS: No  FALLS:  Has patient fallen in last 6 months? No  LIVING ENVIRONMENT: Lives with: lives with their spouse Lives in: House/apartment Stairs: No Has following equipment at home: Single point cane, Environmental consultant - 2 wheeled, Grab bars, and Recumbent Bike  OCCUPATION:  Retired  PLOF: Independent and Leisure: HEP exercises from prior PT episode + some other exercises, bike for 20-30 min/day, work with [employer]  PATIENT GOALS: "To slow my decline."   OBJECTIVE:   PHYSICAL THERAPY PARKINSON'S DISEASE SCREEN  TUG - Timed Up and Go test: 10.37 sec (Normal), 12.16 sec (Manual), 12.93 sec (Cognitive) Baseline as of D/C from last PT episode (04/06/21): 8.47 sec  (Normal), 10.36 sec (Manual), 10.66 (Cognitive) Baseline as of last PT PD Screen (09/26/21):  7.91 sec (Normal), 8.87 sec (Manual), 10.81 sec (Cognitive)  10 meter walk test: 9.75 sec Baseline as of D/C from last  PT episode (04/06/21):  6.66 sec Baseline as of last PT PD Screen (09/26/21):  7.69 sec   5 time sit to stand test: 15.90 sec - pt notes limited by knees Baseline as of D/C from last PT episode (04/06/21): 12.65 sec Baseline as of last PT PD Screen (09/26/21): 12.47 sec  FGA = 20/30; 19-24 = medium risk fall  Baseline as of D/C from last PT episode (04/06/21): 25/30  LE strength grossly 4+ to 5/5   PATIENT EDUCATION:  Education details: PT eval findings and recommendation to request new PT orders Person educated: Patient Education method: Explanation Education comprehension: verbalized understanding   ASSESSMENT / PLAN:  CLINICAL IMPRESSION: Vincent Walker is a 77 y.o. male who was seen today for a 6 month physical therapy screen for Parkinson's disease. He has been regularly keeping up with his HEP from his prior PT episode but is starting to demonstrate a decline across all standardized testing. He also notes more stiffness is his knees with mobility with increased difficulty noted during sit to stand on 5xSTS. He notes close calls but denies any recent falls.  PD Screen Recommendations: Patient would benefit from Physical Therapy evaluation due to declining balance and functional mobility with increasing risk for falls.   Percival Spanish, PT 04/10/2022, 4:37 PM

## 2022-04-10 ENCOUNTER — Ambulatory Visit: Payer: Medicare Other | Attending: Family Medicine | Admitting: Physical Therapy

## 2022-04-10 DIAGNOSIS — R2689 Other abnormalities of gait and mobility: Secondary | ICD-10-CM

## 2022-04-11 ENCOUNTER — Other Ambulatory Visit (HOSPITAL_BASED_OUTPATIENT_CLINIC_OR_DEPARTMENT_OTHER): Payer: Self-pay

## 2022-04-17 ENCOUNTER — Other Ambulatory Visit: Payer: Self-pay

## 2022-04-17 ENCOUNTER — Other Ambulatory Visit (HOSPITAL_BASED_OUTPATIENT_CLINIC_OR_DEPARTMENT_OTHER): Payer: Self-pay

## 2022-05-01 ENCOUNTER — Other Ambulatory Visit (HOSPITAL_BASED_OUTPATIENT_CLINIC_OR_DEPARTMENT_OTHER): Payer: Self-pay

## 2022-05-02 ENCOUNTER — Other Ambulatory Visit (HOSPITAL_BASED_OUTPATIENT_CLINIC_OR_DEPARTMENT_OTHER): Payer: Self-pay

## 2022-05-22 ENCOUNTER — Other Ambulatory Visit: Payer: Self-pay | Admitting: Family Medicine

## 2022-05-22 ENCOUNTER — Other Ambulatory Visit (HOSPITAL_BASED_OUTPATIENT_CLINIC_OR_DEPARTMENT_OTHER): Payer: Self-pay

## 2022-05-23 ENCOUNTER — Encounter (HOSPITAL_BASED_OUTPATIENT_CLINIC_OR_DEPARTMENT_OTHER): Payer: Self-pay

## 2022-05-23 ENCOUNTER — Other Ambulatory Visit (HOSPITAL_BASED_OUTPATIENT_CLINIC_OR_DEPARTMENT_OTHER): Payer: Self-pay

## 2022-05-23 DIAGNOSIS — L570 Actinic keratosis: Secondary | ICD-10-CM | POA: Diagnosis not present

## 2022-05-23 DIAGNOSIS — L814 Other melanin hyperpigmentation: Secondary | ICD-10-CM | POA: Diagnosis not present

## 2022-05-23 DIAGNOSIS — L821 Other seborrheic keratosis: Secondary | ICD-10-CM | POA: Diagnosis not present

## 2022-05-23 DIAGNOSIS — Z85828 Personal history of other malignant neoplasm of skin: Secondary | ICD-10-CM | POA: Diagnosis not present

## 2022-05-23 DIAGNOSIS — D225 Melanocytic nevi of trunk: Secondary | ICD-10-CM | POA: Diagnosis not present

## 2022-05-23 DIAGNOSIS — Z08 Encounter for follow-up examination after completed treatment for malignant neoplasm: Secondary | ICD-10-CM | POA: Diagnosis not present

## 2022-05-28 ENCOUNTER — Other Ambulatory Visit: Payer: Self-pay | Admitting: Family Medicine

## 2022-05-28 ENCOUNTER — Other Ambulatory Visit (HOSPITAL_BASED_OUTPATIENT_CLINIC_OR_DEPARTMENT_OTHER): Payer: Self-pay

## 2022-05-28 MED ORDER — ATORVASTATIN CALCIUM 20 MG PO TABS
20.0000 mg | ORAL_TABLET | Freq: Every day | ORAL | 0 refills | Status: DC
  Filled 2022-05-28: qty 65, 65d supply, fill #0

## 2022-05-29 ENCOUNTER — Encounter: Payer: Medicare Other | Admitting: Psychology

## 2022-05-29 ENCOUNTER — Other Ambulatory Visit (HOSPITAL_BASED_OUTPATIENT_CLINIC_OR_DEPARTMENT_OTHER): Payer: Self-pay

## 2022-05-30 ENCOUNTER — Other Ambulatory Visit (HOSPITAL_BASED_OUTPATIENT_CLINIC_OR_DEPARTMENT_OTHER): Payer: Self-pay

## 2022-05-31 ENCOUNTER — Other Ambulatory Visit (HOSPITAL_BASED_OUTPATIENT_CLINIC_OR_DEPARTMENT_OTHER): Payer: Self-pay

## 2022-06-01 ENCOUNTER — Other Ambulatory Visit (HOSPITAL_BASED_OUTPATIENT_CLINIC_OR_DEPARTMENT_OTHER): Payer: Self-pay

## 2022-06-05 ENCOUNTER — Encounter: Payer: Medicare Other | Admitting: Psychology

## 2022-06-25 ENCOUNTER — Other Ambulatory Visit: Payer: Self-pay

## 2022-06-25 ENCOUNTER — Other Ambulatory Visit (HOSPITAL_BASED_OUTPATIENT_CLINIC_OR_DEPARTMENT_OTHER): Payer: Self-pay

## 2022-07-11 DIAGNOSIS — Z85528 Personal history of other malignant neoplasm of kidney: Secondary | ICD-10-CM | POA: Diagnosis not present

## 2022-07-11 DIAGNOSIS — R351 Nocturia: Secondary | ICD-10-CM | POA: Diagnosis not present

## 2022-07-11 DIAGNOSIS — Z905 Acquired absence of kidney: Secondary | ICD-10-CM | POA: Diagnosis not present

## 2022-07-11 DIAGNOSIS — N200 Calculus of kidney: Secondary | ICD-10-CM | POA: Diagnosis not present

## 2022-07-11 DIAGNOSIS — C641 Malignant neoplasm of right kidney, except renal pelvis: Secondary | ICD-10-CM | POA: Diagnosis not present

## 2022-07-11 DIAGNOSIS — C649 Malignant neoplasm of unspecified kidney, except renal pelvis: Secondary | ICD-10-CM | POA: Diagnosis not present

## 2022-07-13 ENCOUNTER — Other Ambulatory Visit (HOSPITAL_BASED_OUTPATIENT_CLINIC_OR_DEPARTMENT_OTHER): Payer: Self-pay

## 2022-07-19 ENCOUNTER — Ambulatory Visit (HOSPITAL_BASED_OUTPATIENT_CLINIC_OR_DEPARTMENT_OTHER)
Admission: RE | Admit: 2022-07-19 | Discharge: 2022-07-19 | Disposition: A | Discharge status: Home / Self Care | Payer: Medicare Other | Source: Ambulatory Visit | Attending: Family Medicine | Admitting: Family Medicine

## 2022-07-19 ENCOUNTER — Ambulatory Visit (INDEPENDENT_AMBULATORY_CARE_PROVIDER_SITE_OTHER): Payer: Medicare Other | Admitting: Family Medicine

## 2022-07-19 ENCOUNTER — Encounter: Payer: Self-pay | Admitting: Family Medicine

## 2022-07-19 ENCOUNTER — Other Ambulatory Visit (HOSPITAL_BASED_OUTPATIENT_CLINIC_OR_DEPARTMENT_OTHER): Payer: Self-pay

## 2022-07-19 ENCOUNTER — Other Ambulatory Visit: Payer: Self-pay | Admitting: Family Medicine

## 2022-07-19 VITALS — BP 132/76 | HR 70 | Ht 75.0 in | Wt 207.2 lb

## 2022-07-19 DIAGNOSIS — M25551 Pain in right hip: Secondary | ICD-10-CM

## 2022-07-19 DIAGNOSIS — R109 Unspecified abdominal pain: Secondary | ICD-10-CM | POA: Insufficient documentation

## 2022-07-19 DIAGNOSIS — M79651 Pain in right thigh: Secondary | ICD-10-CM | POA: Diagnosis not present

## 2022-07-19 HISTORY — DX: Unspecified abdominal pain: R10.9

## 2022-07-19 MED ORDER — TRAMADOL HCL 50 MG PO TABS
25.0000 mg | ORAL_TABLET | Freq: Three times a day (TID) | ORAL | 0 refills | Status: DC | PRN
  Filled 2022-07-19: qty 15, 5d supply, fill #0

## 2022-07-19 NOTE — Patient Instructions (Signed)
Hip Pain The hip is the joint between the upper legs and the lower pelvis. The bones, cartilage, tendons, and muscles of your hip joint support your body and allow you to move around. Hip pain can range from a minor ache to severe pain in one or both of your hips. The pain may be felt on the inside of the hip joint near the groin, or on the outside near the buttocks and upper thigh. You may also have swelling or stiffness in your hip area. Follow these instructions at home: Managing pain, stiffness, and swelling     If told, put ice on the painful area. Put ice in a plastic bag. Place a towel between your skin and the bag. Leave the ice on for 20 minutes, 2-3 times a day. If told, apply heat to the affected area as often as told by your health care provider. Use the heat source that your provider recommends, such as a moist heat pack or a heating pad. Place a towel between your skin and the heat source. Leave the heat on for 20-30 minutes. If your skin turns bright red, remove the ice or heat right away to prevent skin damage. The risk of damage is higher if you cannot feel pain, heat, or cold. Activity Do exercises as told by your provider. Avoid activities that cause pain. General instructions  Take over-the-counter and prescription medicines only as told by your provider. Keep a journal of your symptoms. Write down: How often you have hip pain. The location of your pain. What the pain feels like. What makes the pain worse. Sleep with a pillow between your legs on your most comfortable side. Keep all follow-up visits. Your provider will monitor your pain and activity. Contact a health care provider if: You cannot put weight on your leg. Your pain or swelling gets worse after a week. It gets harder to walk. You have a fever. Get help right away if: You fall. You have a sudden increase in pain and swelling in your hip. Your hip is red or swollen or very tender to touch. This  information is not intended to replace advice given to you by your health care provider. Make sure you discuss any questions you have with your health care provider. Document Revised: 11/21/2021 Document Reviewed: 11/21/2021 Elsevier Patient Education  2023 Elsevier Inc.  

## 2022-07-19 NOTE — Progress Notes (Addendum)
Subjective:   By signing my name below, I, Shehryar Baig, attest that this documentation has been prepared under the direction and in the presence of Donato Schultz, DO. 07/19/2022   Patient ID: Vincent Walker, male    DOB: [birth year], 77 y.o.   MRN: [id number]  Chief Complaint  Patient presents with   Flank Pain    Hip pain Onset 07/14/2022 after a fall on right side     Flank Pain Associated symptoms include abdominal pain (left lower abdomen).   Patient is in today for a office visit.   He complains of on his lower left abdomen. He fell on 07/14/2022 and since then his abdomen pain worsened and he started having left hip pain. He notes having no pain following the fall but found it developed gradually as the day progressed. He has on and off left lower abdomen and left hip pain while walking. His pain worsens while bending down while standing but has less pain bending down while seated. He denies having blood in urine or muscle spasms. 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 He has taken 2 tylenols after the fall and 2 tylenols yesterday but finds no change in his symptoms, otherwise he has not taken other medication to manage his pain.    Past Medical History:  Diagnosis Date   Adenomatous polyp of ascending colon 10/09/2018   Allergies 07/07/2018   Arthritis    Benign essential hypertension 02/08/2015   Chronic renal insufficiency 02/14/2015   Constipation 12/23/2015   Dyslipidemia 02/08/2015   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 Required nasal packing a couple of days ago.  Had a similar episode of epistaxis a little over a year ago.  At that visit, I could not see the exact bleeding spot. EXAMINATION after packing removal reveals several excoriated areas anteriorly but I was unable to see t   Glaucoma     History of blood transfusion 2010   After Kidney surgery   History of chicken pox    History of renal cell carcinoma 02/08/2015   diagnosed and removed in 2010 Right Monitored by Dr Gaynelle Arabian of urology at [facility] Dr Karin Lieu, nephrology at [facility]   Hyperglycemia 12/23/2015   Hyperlipidemia    Increased thyroid stimulating hormone (TSH) level 06/07/2017   Ingrown left big toenail 02/14/2015   Left foot pain 02/14/2015   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   MVP (mitral valve prolapse) 05/14/2016   Parkinson's disease 12/23/2015   Right hip pain 12/12/2016   Thrombocytopenia 02/08/2015   Tremor of right hand 02/14/2015    Past Surgical History:  Procedure Laterality Date   APPENDECTOMY  1995   CATARACT EXTRACTION, BILATERAL     COLONOSCOPY     COLONOSCOPY WITH PROPOFOL N/A 12/17/2018   Procedure: COLONOSCOPY WITH PROPOFOL;  Surgeon: Lemar Lofty., MD;  Location[facility] ENDOSCOPY;  Service: Gastroenterology;  Laterality: N/A;   ENDOSCOPIC MUCOSAL RESECTION N/A 12/17/2018   Procedure: ENDOSCOPIC MUCOSAL RESECTION;  Surgeon: Meridee Score Netty Starring., MD;  Location: [facility] ENDOSCOPY;  Service: Gastroenterology;  Laterality: N/A;   HEMOSTASIS CLIP PLACEMENT  12/17/2018   Procedure: HEMOSTASIS CLIP PLACEMENT;  Surgeon: Lemar Lofty., MD;  Location: [facility] ENDOSCOPY;  Service: Gastroenterology;;   left knee scope  2003   POLYPECTOMY  12/17/2018   Procedure: POLYPECTOMY;  Surgeon: Meridee Score Netty Starring., MD;  Location: [facility] ENDOSCOPY;  Service: Gastroenterology;;   right knee scope  1999   SUBMUCOSAL LIFTING INJECTION  12/17/2018   Procedure: SUBMUCOSAL LIFTING INJECTION;  Surgeon: Lemar Lofty., MD;  Location: [facility] ENDOSCOPY;  Service: Gastroenterology;;   TOE SURGERY Left    metal 2nd toe- and top of foot- straighten bone   TONSILLECTOMY     TOTAL KNEE ARTHROPLASTY Left 07/06/2014   TOTAL NEPHRECTOMY Right     Family History  Problem  Relation Age of Onset   Alcohol abuse Father    Hyperlipidemia Father    Hypertension Father    Diabetes Father    Heart disease Father    Cancer Maternal Aunt        lung cancer   Cancer Paternal Uncle        bone cancer   Heart attack Mother    Stroke Mother        swelling in brain stem   Healthy Daughter    Healthy Son    Healthy Daughter    Colon cancer Neg Hx    Esophageal cancer Neg Hx    Stomach cancer Neg Hx    Inflammatory bowel disease Neg Hx    Liver disease Neg Hx    Pancreatic cancer Neg Hx    Rectal cancer Neg Hx     Social History   Socioeconomic History   Marital status: Married    Spouse name: Not on file   Number of children: 3   Years of education: 16   Highest education level: Bachelor's degree (e.g., BA, AB, BS)  Occupational History   Occupation: retired Production designer, theatre/television/film in [employer]  Tobacco Use   Smoking status: Former    Types: Pipe    Quit date: 11/18/1995    Years since quitting: 26.6   Smokeless tobacco: Never   Tobacco comments:    stopped 20 years ago.  1996  Vaping Use   Vaping Use: Never used  Substance and Sexual Activity   Alcohol use: Not Currently   Drug use: No   Sexual activity: Yes    Comment: lives with wife, retired from Chiropodist in plant, no major dietary restrictions   Other Topics Concern   Not on file  Social History Narrative   Right Handed   Lives in a one story home   Drinks one cup of coffee a day   Social Determinants of Health   Financial Resource Strain: Low Risk  (11/27/2021)   Overall Financial Resource Strain (CARDIA)    Difficulty of Paying Living Expenses: Not hard at all  Food Insecurity: No Food Insecurity (11/27/2021)   Hunger Vital Sign    Worried About Running Out of Food in the Last Year: Never true    Ran Out of Food in the Last Year: Never true  Transportation Needs: No Transportation Needs (06/11/2019)   PRAPARE - Administrator, Civil Service (Medical): No     Lack of Transportation (Non-Medical): No  Physical Activity: Insufficiently Active (11/27/2021)   Exercise Vital Sign    Days of Exercise per Week: 3 days    Minutes of Exercise per Session: 40 min  Stress: No Stress Concern Present (11/27/2021)   [employer] of Occupational Health - Occupational Stress Questionnaire    Feeling of Stress : Not at all  Social Connections: Not on file  Intimate Partner Violence: Not At Risk (11/27/2021)   Humiliation, Afraid, Rape, and Kick questionnaire  Fear of Current or Ex-Partner: No    Emotionally Abused: No    Physically Abused: No    Sexually Abused: No    Outpatient Medications Prior to Visit  Medication Sig Dispense Refill   acetaminophen (TYLENOL) 500 MG tablet Take 1,000 mg by mouth every 6 (six) hours as needed (pain.).      atenolol (TENORMIN) 25 MG tablet Take 1 tablet (25 mg total) by mouth every evening. 90 tablet 3   atorvastatin (LIPITOR) 20 MG tablet Take 1 tablet (20 mg total) by mouth daily. 90 tablet 0   Brinzolamide-Brimonidine (SIMBRINZA) 1-0.2 % SUSP Take 1 drop in both eyes 3 (three) times daily. 8 mL 11   carbidopa-levodopa (SINEMET CR) 50-200 MG tablet Take 1 tablet by mouth at bedtime. 90 tablet 2   carbidopa-levodopa (SINEMET IR) 25-100 MG tablet Take 2 tablets by mouth at 8am, 2 tabs at 11am, 2 tabs at 2pm, then 1 tab at 6pm as directed 630 tablet 2   clonazePAM (KLONOPIN) 0.5 MG tablet Take 1 tablet (0.5 mg total) by mouth at bedtime. 30 tablet 4   COVID-19 mRNA vaccine 2023-2024 (COMIRNATY) syringe Inject into the muscle. 0.3 mL 0   divalproex (DEPAKOTE ER) 250 MG 24 hr tablet Take 1 tablet (250 mg total) by mouth daily. 90 tablet 2   influenza vaccine adjuvanted (FLUAD QUADRIVALENT) 0.5 ML injection Inject into the muscle. 0.5 mL 0   mirtazapine (REMERON) 15 MG tablet Take 1 tablet (15 mg total) by mouth at bedtime. 90 tablet 2   Multiple Vitamins-Minerals (PRESERVISION AREDS 2 PO) Take 1 tablet by mouth daily.      Polyethyl Glycol-Propyl Glycol (LUBRICANT EYE DROPS) 0.4-0.3 % SOLN Place 1 drop into both eyes 3 (three) times daily as needed (dry/irritated eyes.).     amLODipine (NORVASC) 2.5 MG tablet Take 1/2 - 1 tablets (1.25-2.5 mg total) by mouth daily. (Patient not taking: Reported on 03/05/2022) 90 tablet 0   brimonidine (ALPHAGAN) 0.2 % ophthalmic solution Place 1 drop into both eyes 2 (two) times daily. (Patient not taking: Reported on 07/19/2022) 15 mL 3   fluorouracil (EFUDEX) 5 % cream Apply to the nose 2 times daily for 3 weeks as tolerated (Patient not taking: Reported on 07/19/2022) 40 g 1   fluticasone (CUTIVATE) 0.05 % cream Apply twice a day to affected areas on face for 1 week on 1 week off as needed for flares (Patient not taking: Reported on 07/19/2022) 30 g 1   Travoprost, BAK Free, (TRAVATAN Z) 0.004 % SOLN ophthalmic solution Instill one drop into both eyes at night (Patient not taking: Reported on 07/19/2022) 5 mL 6   No facility-administered medications prior to visit.    Allergies  Allergen Reactions   Nsaids    Sulfa Antibiotics Other (See Comments)    Had a reaction as a child   Tolmetin Other (See Comments)    Review of Systems  Gastrointestinal:  Positive for abdominal pain (left lower abdomen).  Genitourinary:  Negative for hematuria.  Musculoskeletal:        (+)left hip pain (-)muscle spasms       Objective:    Physical Exam Constitutional:      General: He is not in acute distress.    Appearance: Normal appearance. He is not ill-appearing.  HENT:     Head: Normocephalic and atraumatic.     Right Ear: External ear normal.     Left Ear: External ear normal.  Eyes:  Extraocular Movements: Extraocular movements intact.     Pupils: Pupils are equal, round, and reactive to light.  Cardiovascular:     Rate and Rhythm: Normal rate and regular rhythm.     Heart sounds: Normal heart sounds. No murmur heard.    No gallop.  Pulmonary:     Effort: Pulmonary  effort is normal. No respiratory distress.     Breath sounds: Normal breath sounds. No wheezing or rales.  Musculoskeletal:     Left hip: Tenderness present.     Comments: No pain during left hip abduction Left hip pain pain while getting up from a seated position  Skin:    General: Skin is warm and dry.  Neurological:     Mental Status: He is alert and oriented to person, place, and time.  Psychiatric:        Judgment: Judgment normal.     BP 132/76 (BP Location: Left Arm, Patient Position: Sitting, Cuff Size: Large)   Pulse 70   Ht  (1.905 m)   Wt 207 lb 3.2 oz (94 kg)   SpO2 97%   BMI 25.90 kg/m  Wt Readings from Last 3 Encounters:  07/19/22 207 lb 3.2 oz (94 kg)  03/05/22 216 lb 9.6 oz (98.2 kg)  11/27/21 220 lb (99.8 kg)       Assessment & Plan:  Right hip pain Assessment & Plan: S/p fall  Check xray today No relief with tylenol Tramadol sent in  Refer to ortho  Orders: -     DG HIP UNILAT W OR W/O PELVIS 2-3 VIEWS RIGHT; Future -     traMADol HCl; Take 0.5-1 tablets (25-50 mg total) by mouth 3 (three) times daily as needed.  Dispense: 15 tablet; Refill: 0  Flank pain    I, Donato Schultz, DO, personally preformed the services described in this documentation.  All medical record entries made by the scribe were at my direction and in my presence.  I have reviewed the chart and discharge instructions (if applicable) and agree that the record reflects my personal performance and is accurate and complete. 07/19/2022   I,Shehryar Baig,acting as a scribe for Donato Schultz, DO.,have documented all relevant documentation on the behalf of Donato Schultz, DO,as directed by  Donato Schultz, DO while in the presence of Donato Schultz, DO.   Donato Schultz, DO

## 2022-07-19 NOTE — Assessment & Plan Note (Signed)
S/p fall  Check xray today No relief with tylenol Tramadol sent in  Refer to ortho

## 2022-07-26 DIAGNOSIS — M5416 Radiculopathy, lumbar region: Secondary | ICD-10-CM | POA: Diagnosis not present

## 2022-07-26 DIAGNOSIS — M25551 Pain in right hip: Secondary | ICD-10-CM | POA: Diagnosis not present

## 2022-07-31 ENCOUNTER — Other Ambulatory Visit: Payer: Self-pay

## 2022-08-14 NOTE — Progress Notes (Unsigned)
Assessment/Plan:   1.  Parkinsons Disease  -Continue carbidopa/levodopa 25/100 IR, 2 at 8am/2 at 11am/2 at 2pm/1 at 6pm.  -start inbrija at night and then early AM and prn up to 5 times per day.  Shown how to use it.  -Continue carbidopa/levodopa 50/200 at bedtime  -discussed walker at all times  -RX PT 2.  RBD/RLS/insomnia  -Continue clonazepam 0.5 mg, full tablet at bedtime.  Tried to stop this medication and things got much worse, especially mood change and daytime hypersomnolence did not get better.  Lower dosages resulted in increasing RBD symptoms.  PDMP is reviewed.  Last filled November 21.  Refilled today but can't refill until end December.    -Unfortunately, he continues to have restless leg, but medicines really cannot be increased further.  He is already on extended release levodopa and clonazepam at bedtime.  I really do not want to add a opioids.  We could consider gabapentin, if needed but he is already on so many medicines that I would like to avoid further pharmacologic therapy for this.  I did tell him, as above that we could add qhs inbrija to see if it helps  -on mirtazapine, 15 mg q hs.    -some of night issues due to nocturia.  Needs to f/u with PCP/urology 3.  Anxiety/depression  -Being followed by primary care.  On Depakote ER, 250 mg daily.   4.  Daytime hypersomnolence  -Nocturnal polysomnogram was negative, but he was awake most of the night so not sure that it was accurate 5.  [facility], possibly transition now to PDD  -Neurocognitive testing done in July, 2022 was unremarkable.  Pt/Patients wife feels that cognitively he is getting slower/worse.  He had repeat testing scheduled for February, 2024 but ended up canceling that.  They report that they are going to r/s  -Has declined Nuplazid thus far  -Patient would really like to go back to driving.  Dr. Milbert Coulter felt that neurocognitive test was not a great predictor of an individual's ability to safely operate a motor  vehicle.  He did recommend a driving evaluation, which I agree with. 6.  AM dizziness  -don't think related to Parkinsons Disease meds as it starts in the AM prior to taking meds and doesn't persist in the day  -he has d/c the amlodipine  -He is on low-dose atenolol for PVCs.  They have decreased the dose of time.  They don't think that they can decrease further d/t pvc's.  BP is not too low.     Subjective:   Vincent Walker was seen today in follow up for Parkinsons disease.  My previous records were reviewed prior to todays visit as well as outside records available to me. Pt with wife who supplements hx. patient has been seeing sports medicine regarding hip pain and notes are reviewed regarding that.  He had a fall about a month ago.  He reached for the baby gate for the animals and he got tangled in the gate and ended up "sitting on the ground."  He crawled to the bed and pulled himself up and got himself up off the floor.  That was the only fall in the last year.  [facility] and they thought that [facility] was supposed to call them for more therapy but didn't.   Patient had repeat neurocognitive testing scheduled for February, but ended up canceling that.  He states memory is getting worse.  Last visit, he was complaining about morning dizziness.  I did not think that was related to Parkinsons disease medications, as it started prior to taking his medicines in the morning.  I did talk to his primary care last visit and asked her if we could cut his atenolol dose in half and she agreed and patient reports today that he is still dizzy but if doesn't take the atenolol he will start "throwing PVC's."  They don't bother him per pt but wife states that they used to.  BP in the AM running in the 120's but runs higher later in the day - 140's.  Current prescribed movement disorder medications: carbidopa/levodopa 25/100 , 2 at 8am/2 at 11am/2 at 2pm/1 at 6pm Clonazepam 0.5 mg, 1 tablet at  bedtime (an increase) carbidopa/levodopa 50/200 at bedtime Mirtazapine, 15 mg nightly Depakote, 250 mg daily  PREVIOUS MEDICATIONS: Carbidopa/levodopa 25/100 IR; carbidopa/levodopa 25/100 CR (tried to change to see if would help EDS and didn't so changed back)  ALLERGIES:   Allergies  Allergen Reactions   Nsaids    Sulfa Antibiotics Other (See Comments)    Had a reaction as a child   Tolmetin Other (See Comments)    CURRENT MEDICATIONS:  Outpatient Encounter Medications as of 08/16/2022  Medication Sig   acetaminophen (TYLENOL) 500 MG tablet Take 1,000 mg by mouth every 6 (six) hours as needed (pain.).    atenolol (TENORMIN) 25 MG tablet Take 1 tablet (25 mg total) by mouth every evening. (Patient taking differently: Take 12.5 mg by mouth in the morning.)   atorvastatin (LIPITOR) 20 MG tablet Take 1 tablet (20 mg total) by mouth daily.   Brinzolamide-Brimonidine (SIMBRINZA) 1-0.2 % SUSP Take 1 drop in both eyes 3 (three) times daily.   carbidopa-levodopa (SINEMET CR) 50-200 MG tablet Take 1 tablet by mouth at bedtime.   carbidopa-levodopa (SINEMET IR) 25-100 MG tablet Take 2 tablets by mouth at 8am, 2 tabs at 11am, 2 tabs at 2pm, then 1 tab at 6pm as directed   clonazePAM (KLONOPIN) 0.5 MG tablet Take 1 tablet (0.5 mg total) by mouth at bedtime.   COVID-19 mRNA vaccine 2023-2024 (COMIRNATY) syringe Inject into the muscle.   divalproex (DEPAKOTE ER) 250 MG 24 hr tablet Take 1 tablet (250 mg total) by mouth daily.   influenza vaccine adjuvanted (FLUAD QUADRIVALENT) 0.5 ML injection Inject into the muscle.   mirtazapine (REMERON) 15 MG tablet Take 1 tablet (15 mg total) by mouth at bedtime.   Multiple Vitamins-Minerals (PRESERVISION AREDS 2 PO) Take 1 tablet by mouth daily.   Polyethyl Glycol-Propyl Glycol (LUBRICANT EYE DROPS) 0.4-0.3 % SOLN Place 1 drop into both eyes 3 (three) times daily as needed (dry/irritated eyes.).   traMADol (ULTRAM) 50 MG tablet Take 0.5-1 tablets (25-50 mg  total) by mouth 3 (three) times daily as needed. (Patient taking differently: Take 25-50 mg by mouth as needed.)   [DISCONTINUED] amLODipine (NORVASC) 2.5 MG tablet Take 1/2 - 1 tablets (1.25-2.5 mg total) by mouth daily. (Patient not taking: Reported on 03/05/2022)   [DISCONTINUED] brimonidine (ALPHAGAN) 0.2 % ophthalmic solution Place 1 drop into both eyes 2 (two) times daily. (Patient not taking: Reported on 07/19/2022)   [DISCONTINUED] fluorouracil (EFUDEX) 5 % cream Apply to the nose 2 times daily for 3 weeks as tolerated (Patient not taking: Reported on 07/19/2022)   [DISCONTINUED] fluticasone (CUTIVATE) 0.05 % cream Apply twice a day to affected areas on face for 1 week on 1 week off as needed for flares (  Patient not taking: Reported on 07/19/2022)   [DISCONTINUED] Travoprost, BAK Free, (TRAVATAN Z) 0.004 % SOLN ophthalmic solution Instill one drop into both eyes at night (Patient not taking: Reported on 07/19/2022)   No facility-administered encounter medications on file as of 08/16/2022.    Objective:   PHYSICAL EXAMINATION:    VITALS:   Vitals:   08/16/22 1104  BP: 118/70  Pulse: 71  SpO2: 97%  Weight: 207 lb 9.6 oz (94.2 kg)  Height: 6\' 3"  (1.905 m)   Wt Readings from Last 3 Encounters:  08/16/22 207 lb 9.6 oz (94.2 kg)  07/19/22 207 lb 3.2 oz (94 kg)  03/05/22 216 lb 9.6 oz (98.2 kg)   GEN:  The patient appears stated age and is in NAD. HEENT:  Normocephalic, atraumatic.  The mucous membranes are moist. The superficial temporal arteries are without ropiness or tenderness. CV:  RRR Lungs:  CTAB Neck/HEME:  There are no carotid bruits bilaterally.  Neurological examination:  Orientation: The patient is alert and oriented x3 but looks to wife for finer aspects Cranial nerves: There is good facial symmetry with facial hypomimia. The speech is fluent and clear. Soft palate rises symmetrically and there is no tongue deviation. Hearing is intact to conversational tone. Sensation:  Sensation is intact to light touch throughout Motor: Strength is at least antigravity x4.  Movement examination: Tone: There is normal tone today in the upper and lower extremities. Abnormal movements: there is no tremor today Coordination:  There is mild decremation, with any form of RAMS, including alternating supination and pronation of the forearm, hand opening and closing, finger taps, heel taps and toe taps, R>L Gait and Station: The patient arises OOC without hands.  He is slow and careful with the cane.  He is not shuffling today  I have reviewed and interpreted the following labs independently    Chemistry      Component Value Date/Time   NA 139 12/27/2020 1443   K 4.5 12/27/2020 1443   CL 104 12/27/2020 1443   CO2 27 12/27/2020 1443   BUN 17 12/27/2020 1443   CREATININE 1.40 12/27/2020 1443      Component Value Date/Time   CALCIUM 10.0 12/27/2020 1443   ALKPHOS 82 12/27/2020 1443   AST 21 12/27/2020 1443   ALT 12 12/27/2020 1443   BILITOT 0.5 12/27/2020 1443       Lab Results  Component Value Date   WBC 9.2 08/28/2019   HGB 14.0 08/28/2019   HCT 41.8 08/28/2019   MCV 92.1 08/28/2019   PLT 190.0 08/28/2019    Lab Results  Component Value Date   TSH 2.45 04/06/2019     Total time spent on today's visit was 41 minutes, including both face-to-face time and nonface-to-face time.  Time included that spent on review of records (prior notes available to me/labs/imaging if pertinent), discussing treatment and goals, answering patient's questions and coordinating care.  Cc:  Bradd Canary, MD

## 2022-08-16 ENCOUNTER — Other Ambulatory Visit (HOSPITAL_BASED_OUTPATIENT_CLINIC_OR_DEPARTMENT_OTHER): Payer: Self-pay

## 2022-08-16 ENCOUNTER — Ambulatory Visit (INDEPENDENT_AMBULATORY_CARE_PROVIDER_SITE_OTHER): Payer: Medicare Other | Admitting: Neurology

## 2022-08-16 ENCOUNTER — Other Ambulatory Visit: Payer: Self-pay | Admitting: Family Medicine

## 2022-08-16 ENCOUNTER — Encounter: Payer: Self-pay | Admitting: Neurology

## 2022-08-16 VITALS — BP 118/70 | HR 71 | Ht 75.0 in | Wt 207.6 lb

## 2022-08-16 DIAGNOSIS — G20A1 Parkinson's disease without dyskinesia, without mention of fluctuations: Secondary | ICD-10-CM

## 2022-08-16 DIAGNOSIS — G2581 Restless legs syndrome: Secondary | ICD-10-CM

## 2022-08-16 MED ORDER — ATORVASTATIN CALCIUM 20 MG PO TABS
20.0000 mg | ORAL_TABLET | Freq: Every day | ORAL | 0 refills | Status: DC
  Filled 2022-08-16: qty 90, 90d supply, fill #0

## 2022-08-16 NOTE — Patient Instructions (Signed)
We are going to start inbrija.  Remember that TWO capsules is ONE dosage (never inhale just one capsule).  You can inhale the capsules as needed up to 5 times per day, separated by 2 hour intervals.  Many patients use this right when the wake up to help with first morning "on" and then as needed during the day.  You should take a sip of water prior to using the inhaler to avoid side effects.  It may generate some cough right when you use it and that is normal.  There are nurse educators available to help you with this device.  You can call [phone] and they will set you up with a nurse educator to assist you for free of charge.  They are available 8am-8pm Monday-Friday.  

## 2022-08-21 DIAGNOSIS — Z961 Presence of intraocular lens: Secondary | ICD-10-CM

## 2022-08-21 DIAGNOSIS — D3131 Benign neoplasm of right choroid: Secondary | ICD-10-CM

## 2022-08-21 DIAGNOSIS — H43813 Vitreous degeneration, bilateral: Secondary | ICD-10-CM

## 2022-08-21 DIAGNOSIS — H524 Presbyopia: Secondary | ICD-10-CM

## 2022-08-21 DIAGNOSIS — H52203 Unspecified astigmatism, bilateral: Secondary | ICD-10-CM

## 2022-08-21 HISTORY — DX: Benign neoplasm of right choroid: D31.31

## 2022-08-21 HISTORY — DX: Presence of intraocular lens: Z96.1

## 2022-08-21 HISTORY — DX: Vitreous degeneration, bilateral: H43.813

## 2022-08-21 HISTORY — DX: Unspecified astigmatism, bilateral: H52.4

## 2022-08-23 ENCOUNTER — Other Ambulatory Visit (HOSPITAL_BASED_OUTPATIENT_CLINIC_OR_DEPARTMENT_OTHER): Payer: Self-pay

## 2022-08-25 ENCOUNTER — Other Ambulatory Visit: Payer: Self-pay | Admitting: Neurology

## 2022-08-28 ENCOUNTER — Other Ambulatory Visit (HOSPITAL_BASED_OUTPATIENT_CLINIC_OR_DEPARTMENT_OTHER): Payer: Self-pay

## 2022-08-28 ENCOUNTER — Other Ambulatory Visit: Payer: Self-pay

## 2022-08-28 DIAGNOSIS — H401112 Primary open-angle glaucoma, right eye, moderate stage: Secondary | ICD-10-CM | POA: Diagnosis not present

## 2022-08-28 DIAGNOSIS — H04123 Dry eye syndrome of bilateral lacrimal glands: Secondary | ICD-10-CM | POA: Diagnosis not present

## 2022-08-28 DIAGNOSIS — Z83511 Family history of glaucoma: Secondary | ICD-10-CM | POA: Diagnosis not present

## 2022-08-28 DIAGNOSIS — H0100A Unspecified blepharitis right eye, upper and lower eyelids: Secondary | ICD-10-CM | POA: Diagnosis not present

## 2022-08-28 DIAGNOSIS — H0100B Unspecified blepharitis left eye, upper and lower eyelids: Secondary | ICD-10-CM | POA: Diagnosis not present

## 2022-08-28 DIAGNOSIS — H401123 Primary open-angle glaucoma, left eye, severe stage: Secondary | ICD-10-CM | POA: Diagnosis not present

## 2022-08-28 HISTORY — DX: Dry eye syndrome of bilateral lacrimal glands: H04.123

## 2022-08-28 MED ORDER — CLONAZEPAM 0.5 MG PO TABS
0.5000 mg | ORAL_TABLET | Freq: Every day | ORAL | 5 refills | Status: DC
  Filled 2022-08-28: qty 30, 30d supply, fill #0
  Filled 2022-09-23: qty 30, 30d supply, fill #1
  Filled 2022-10-25: qty 30, 30d supply, fill #2
  Filled 2022-11-23: qty 30, 30d supply, fill #3
  Filled 2022-12-24: qty 30, 30d supply, fill #4
  Filled 2023-01-23: qty 30, 30d supply, fill #5

## 2022-08-29 ENCOUNTER — Encounter: Payer: Self-pay | Admitting: Neurology

## 2022-08-30 ENCOUNTER — Other Ambulatory Visit: Payer: Self-pay

## 2022-08-30 MED ORDER — INBRIJA 42 MG IN CAPS: ORAL_CAPSULE | RESPIRATORY_TRACT | 5 refills | Status: DC

## 2022-09-03 ENCOUNTER — Encounter: Payer: Self-pay | Admitting: Neurology

## 2022-09-03 ENCOUNTER — Other Ambulatory Visit: Payer: Self-pay

## 2022-09-03 DIAGNOSIS — G20A1 Parkinson's disease without dyskinesia, without mention of fluctuations: Secondary | ICD-10-CM

## 2022-09-03 DIAGNOSIS — G2581 Restless legs syndrome: Secondary | ICD-10-CM

## 2022-09-03 MED ORDER — INBRIJA 42 MG IN CAPS
ORAL_CAPSULE | RESPIRATORY_TRACT | 5 refills | Status: DC
Start: 2022-09-03 — End: 2023-10-07

## 2022-09-24 ENCOUNTER — Other Ambulatory Visit: Payer: Self-pay

## 2022-09-24 NOTE — Therapy (Signed)
OUTPATIENT PHYSICAL THERAPY PARKINSON'S EVALUATION   Patient Name: Vincent Walker MRN: [id number] DOB:[birth year], 77 y.o., male Today's Date: 09/25/2022   END OF SESSION:  PT End of Session - 09/25/22 1534     Visit Number 1    Date for PT Re-Evaluation 11/20/22    Authorization Type Medicare & Federal BCBS    PT Start Time 1534    PT Stop Time 1625    PT Time Calculation (min) 51 min    Activity Tolerance Patient tolerated treatment well    Behavior During Therapy [facility] for tasks assessed/performed             Past Medical History:  Diagnosis Date   Adenomatous polyp of ascending colon 10/09/2018   Allergies 07/07/2018   Arthritis    Benign essential hypertension 02/08/2015   Chronic renal insufficiency 02/14/2015   Constipation 12/23/2015   Dyslipidemia 02/08/2015   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 Required nasal packing a couple of days ago.  Had a similar episode of epistaxis a little over a year ago.  At that visit, I could not see the exact bleeding spot. EXAMINATION after packing removal reveals several excoriated areas anteriorly but I was unable to see t   Glaucoma    History of blood transfusion 2010   After Kidney surgery   History of chicken pox    History of renal cell carcinoma 02/08/2015   diagnosed and removed in 2010 Right Monitored by Dr Gaynelle Arabian of urology at [facility] Dr Karin Lieu, nephrology at [facility]   Hyperglycemia 12/23/2015   Hyperlipidemia    Increased thyroid stimulating hormone (TSH) level 06/07/2017   Ingrown left big toenail 02/14/2015   Left foot pain 02/14/2015   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   MVP (mitral valve prolapse) 05/14/2016   Parkinson's disease 12/23/2015   Right hip pain 12/12/2016   Thrombocytopenia 02/08/2015   Tremor of right hand 02/14/2015   Past Surgical  History:  Procedure Laterality Date   APPENDECTOMY  1995   CATARACT EXTRACTION, BILATERAL     COLONOSCOPY     COLONOSCOPY WITH PROPOFOL N/A 12/17/2018   Procedure: COLONOSCOPY WITH PROPOFOL;  Surgeon: Lemar Lofty., MD;  Location: [facility] ENDOSCOPY;  Service: Gastroenterology;  Laterality: N/A;   ENDOSCOPIC MUCOSAL RESECTION N/A 12/17/2018   Procedure: ENDOSCOPIC MUCOSAL RESECTION;  Surgeon: Meridee Score Netty Starring., MD;  Location: [facility] ENDOSCOPY;  Service: Gastroenterology;  Laterality: N/A;   HEMOSTASIS CLIP PLACEMENT  12/17/2018   Procedure: HEMOSTASIS CLIP PLACEMENT;  Surgeon: Lemar Lofty., MD;  Location: [facility] ENDOSCOPY;  Service: Gastroenterology;;   left knee scope  2003   POLYPECTOMY  12/17/2018   Procedure: POLYPECTOMY;  Surgeon: Meridee Score Netty Starring., MD;  Location: [facility] ENDOSCOPY;  Service: Gastroenterology;;   right knee scope  1999   SUBMUCOSAL LIFTING INJECTION  12/17/2018   Procedure: SUBMUCOSAL LIFTING INJECTION;  Surgeon: Lemar Lofty., MD;  Location: [facility] ENDOSCOPY;  Service: Gastroenterology;;   TOE SURGERY Left    metal 2nd toe- and top of foot- straighten bone   TONSILLECTOMY     TOTAL KNEE ARTHROPLASTY Left 07/06/2014   TOTAL NEPHRECTOMY Right    Patient Active Problem List   Diagnosis Date Noted   Flank pain 07/19/2022   Eustachian tube dysfunction, left 02/21/2021   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   Early stage nonexudative age-related macular degeneration of both  eyes 03/09/2019   Adenomatous polyp of ascending colon 10/09/2018   Epistaxis 07/07/2018   Allergies 07/07/2018   Obesity 06/15/2018   Increased thyroid stimulating hormone (TSH) level 06/07/2017   Right hip pain 12/12/2016   MVP (mitral valve prolapse) 05/14/2016   Parkinson's disease 12/23/2015   Constipation 12/23/2015   Hyperglycemia 12/23/2015   Tremor of right hand 02/14/2015   Left foot pain 02/14/2015   Chronic renal insufficiency  02/14/2015   Benign essential hypertension 02/08/2015   Dyslipidemia 02/08/2015   History of renal cell carcinoma 02/08/2015   Thrombocytopenia 02/08/2015   History of chicken pox     PCP: Bradd Canary, MD   REFERRING PROVIDER: Vladimir Faster, DO   REFERRING DIAG: G20.A1 (ICD-10-CM) - Parkinson's disease without dyskinesia or fluctuating manifestations   THERAPY DIAG:  Other abnormalities of gait and mobility  Unsteadiness on feet  Muscle weakness (generalized)  History of falling  RATIONALE FOR EVALUATION AND TREATMENT: Rehabilitation  ONSET DATE: PD diagnosis 4+ years ago   NEXT MD VISIT: 02/25/23   SUBJECTIVE:                                                                                                                                                                                                         SUBJECTIVE STATEMENT: Pt reports he feels like he is ready to give up/gave up for a while - 2 close friends have passed recently which has upset him. He notes he has to be more careful with mobility in general - walking, bending over to pick things up, etc. - feels like he has to keep a closer eye on everything he does.  He has been using a SPC for safety.   Pt accompanied by: significant other - wife [facility]) in waiting room  PAIN: Are you having pain? No  PERTINENT HISTORY:  Parkinson's disease, mild neurocognitive disorder due to PD, HTN, MVP, L TKA, R knee scope, CRI, h/o renal cancer with R nephrectomy, thrombocytopenia, dyslipidemia, B macular degeneration    PRECAUTIONS: Fall  WEIGHT BEARING RESTRICTIONS: No  FALLS:  Has patient fallen in last 6 months? Yes. Number of falls 1  LIVING ENVIRONMENT: Lives with: lives with their spouse Lives in: House/apartment Stairs: No Has following equipment at home: Single point cane, Environmental consultant - 2 wheeled, Grab bars, and Recumbent Bike  OCCUPATION: Retired  PLOF: Independent and Leisure: mostly sedentary recently  (previously he had been completing HEP exercises from prior PT episode + some other exercises, bike for 20-30 min/day, work with [employer])  PATIENT GOALS: "To get going again."   OBJECTIVE: (objective measures completed at initial evaluation unless otherwise dated)  DIAGNOSTIC FINDINGS:  07/19/22 - R hip and pelvis x-ray: FINDINGS: Mildly decreased bone mineralization. Mild bilateral sacroiliac subchondral sclerosis. The pubic symphysis joint space is maintained.   Mild bilateral superior femoroacetabular joint space narrowing. Mild-to-moderate right and mild left superolateral acetabular degenerative osteophytes. Normal morphology of the right femoral head-neck junction without CAM-type bump deformity. No acute fracture or dislocation.   Mild atherosclerotic calcifications. Multiple vascular phleboliths again overlie the pelvis.   IMPRESSION: 1. No acute fracture. 2. 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  COGNITION: Overall cognitive status: Impaired - decreased short-term memory and recall of new information   SENSATION: WFL  COORDINATION: B heel-toe mildly diminished. Impaired heel to shin bilaterally.  EDEMA:  N/A  MUSCLE TONE: Increased LE muscle tone noted during attempts at PROM/flexibility assessment.  MUSCLE LENGTH: Hamstrings: mod tight B ITB: mild tight B Piriformis: mod tight B Hip flexors: mod tight B Quads: mild tight B Heelcord: mod tight R, mild tight L  POSTURE:  rounded shoulders, forward head, decreased lumbar lordosis, increased thoracic kyphosis, and flexed trunk   LOWER EXTREMITY ROM:    Mildly limited B hip and ankle ROM (R>L), mostly due to limited muscle flexibility as above.  LOWER EXTREMITY MMT:    MMT Right Eval Left Eval  Hip flexion 4+ 4+  Hip extension 4 4+  Hip abduction 4 4+  Hip adduction 4+ 4+  Hip internal rotation 5 5  Hip external rotation 4+ 4+  Knee flexion 4+ 4+  Knee extension 4+  4+  Ankle dorsiflexion 4+ 4+  Ankle plantarflexion 4 4  Ankle inversion    Ankle eversion    (Blank rows = not tested)  BED MOBILITY:  Supine to/from sit and rolling with SBA/CGA as well as min assist to lift leg onto treatment table  TRANSFERS: Assistive device utilized: Single point cane and None  Sit to stand: Modified independence Stand to sit: Modified independence Chair to chair: Modified independence Floor:  NT  GAIT: Distance walked: 120 ft Assistive device utilized: Single point cane and None Level of assistance: SBA Gait pattern: decreased arm swing- Right, decreased arm swing- Left, decreased stride length, decreased hip/knee flexion- Right, decreased hip/knee flexion- Left, decreased trunk rotation, trunk flexed, poor foot clearance- Right, and poor foot clearance- Left Comments: Stooped posture with decreased reciprocal movement patterns and decreased stride length along with poor sequencing of cane  RAMP: Level of Assistance:  NT Assistive device utilized:  NT Ramp Comments:   CURB:  Level of Assistance:  NT Assistive device utilized:  NT Curb Comments:   STAIRS:  Level of Assistance: SBA  Stair Negotiation Technique: Alternating Pattern  with Single Rail on Right  Number of Stairs: 14   Height of Stairs: 7"  Comments: Patient is very hesitant on stairs with 2 hands on rail for most of ascent and descent  FUNCTIONAL TESTS:  5 times sit to stand: 13.03 sec with B UE assist, unable to achieve full stand w/o UE assist Timed up and go (TUG): Normal = 11.06 sec, Manual = 12.09 sec, Cognitive = 14.56 sec 10 meter walk test: 11.07 sec w/o AD, 10.88 sec with SPC; Gait speed = 2.96 ft/sec w/o AD, 3.01 ft/sec with SPC Berg Balance Scale: TBA Functional gait assessment: 15/30; < 19 = high risk fall   Baseline as of 04/10/22 PT PD Screen: 5xSTS: 15.90 sec - pt  notes limited by knees TUG: 10.37 sec (Normal), 12.16 sec (Manual), 12.93 sec (Cognitive) : 9.75  sec Gait speed = 3.36 ft/sec FGA = 20/30; 19-24 = medium risk fall   Baseline as of 09/26/21 PT PD Screen: 5xSTS: 12.47 sec TUG: 7.91 sec (Normal), 8.87 sec (Manual), 10.81 sec (Cognitive) : 7.69 sec  Gait speed = 4.27 ft/sec  Baseline as of D/C from last PT episode (04/06/21): 5xSTS: 12.65 sec TUG: 8.47 sec (Normal), 10.38 sec (Manual), 10.66 (Cognitive) : 6.66 sec Gait speed = 4.92 ft/sec  Berg: 53/56; 52-55 = lower risk for falls (> 25%)  FGA = 25/30; 25-28 = low risk fall   PATIENT SURVEYS:  ABC scale 1060 / 1600 = 66.3 %   TODAY'S TREATMENT:   09/25/22 Initial eval only   PATIENT EDUCATION:  Education details: PT eval findings, anticipated POC, and need for further assessment of Berg   Person educated: Patient and Spouse Education method: Explanation Education comprehension: verbalized understanding  HOME EXERCISE PROGRAM: TBD   ASSESSMENT:  CLINICAL IMPRESSION: KAL CHAIT is a 77 y.o. male who was seen today for physical therapy evaluation and treatment for Parkinson's disease.  He has previously worked with this physical therapist for an episode related to Parkinson's disease in 2022 with good gains noted and patient initially very compliant with follow-up with HEP and PWR! Moves.  Initial 34-month PD screening in June 2023 revealed no significant change or decline, and patient remained compliant with HEP.  Second 79-month PD screen in January of this year revealed beginnings of an overall decline per standardized testing with PT order to evaluate and treat requested from MD, however patient not returning until current orders received.  Since the latest screening, he notes continued decline and has gotten away from performing the HEP and PWR! Moves as well as other activities such as riding a bike which he had been previously doing.  He reports 1 new fall in the past 6 months.  He continues to use a [facility] for balance although gait pattern with cane needs further  training.  Current findings include mild overall LE weakness with moderate B PF weakness, decreased LE flexibility, decreased functional mobility with inability to complete sit to stand w/o UE assist, decreased gait speed to 2.96 ft/sec w/o AD and 3.01 ft/sec with SPC as compared to 4.92 ft/sec w/o AD as of prior discharge from PT, and decline in balance with increased fall risk per standardized testing (FGA score of 15/30 indicating high risk for falls).  Zidane will benefit from skilled PT to address above deficits to improve mobility and activity tolerance for improved safety and decreased risk for falls.   OBJECTIVE IMPAIRMENTS: Abnormal gait, decreased activity tolerance, decreased balance, decreased knowledge of condition, decreased knowledge of use of DME, decreased mobility, difficulty walking, decreased ROM, decreased strength, decreased safety awareness, increased fascial restrictions, impaired perceived functional ability, impaired flexibility, improper body mechanics, and postural dysfunction.   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   REHAB POTENTIAL: Good  CLINICAL DECISION MAKING: Evolving/moderate complexity  EVALUATION COMPLEXITY: Moderate   GOALS: Goals  reviewed with patient? Yes  SHORT TERM GOALS: Target date: 10/23/2022  Patient will be independent with initial HEP. Baseline: TBD Goal status: INITIAL  2.  Patient will demonstrate  appropriate sequencing of SPC or LRAD with gait for improved balance and decrease fall risk. Baseline:  Goal status: INITIAL  3.  Patient will verbalize tips to reduce freezing/festination with gait and turns. Baseline:  Goal status: INITIAL  LONG TERM GOALS: Target date: 11/20/2022  Patient will be independent with ongoing/advanced HEP for self-management at home incorporating PWR! Moves as indicated .  Baseline:  Goal status: INITIAL  2.  Patient will be able to ambulate 600' with or w/o LRAD with good safety to access community.  Baseline:  Goal status: INITIAL  3.  Patient will be able to step up/down curb safely with or w/o LRAD for safety with community ambulation.  Baseline:  Goal status: INITIAL   4.  Patient will demonstrate gait speed of >/= 3.9 ft/sec (1.2 m/s) to be a safe community ambulator including ability to safely cross the street.  Baseline: 2.96 ft/sec w/o AD, 3.01 ft/sec with SPC Goal status: INITIAL  5.  Patient will improve 5x STS time to </= 16 seconds w/o need for UE assist to demonstrate improved functional strength and transfer efficiency. Baseline: 13.03 sec with B UE assist, unable to achieve full stand w/o UE assist Goal status: INITIAL  6.  Patient will demonstrate at least 19/30 on FGA to improve gait stability and reduce risk for falls. (MCID = 4 points) Baseline: 15/30 Goal status: INITIAL  7.  Patient will improve Berg score by at least 8 points or to >/= 52/56 to improve safety and stability with ADLs in standing and reduce risk for falls. (MCID = 8 points)   Baseline: TBD Goal status: INITIAL  8.  Patient will report >/= 75% on ABC scale to demonstrate improved balance confidence and decreased risk for falls. Baseline: 1060 / 1600 = 66.3 % Goal status: INITIAL  9. Patient will verbalize understanding of local Parkinson's disease community resources, including community fitness post d/c. Baseline:  Goal status: INITIAL   PLAN:  PT  FREQUENCY: 2x/week  PT DURATION: 6-8 weeks (POC for 8 weeks due to patient on vacation x 2 weeks)  PLANNED INTERVENTIONS: Therapeutic exercises, Therapeutic activity, Neuromuscular re-education, Balance training, Gait training, Patient/Family education, Self Care, Joint mobilization, Stair training, DME instructions, Aquatic Therapy, Electrical stimulation, Cryotherapy, Moist heat, Taping, Ultrasound, Manual therapy, and Re-evaluation  PLAN FOR NEXT SESSION: Complete balance assessment - Berg; create initial HEP for lumbopelvic flexibility and strengthening; initiate PWR! Moves as appropriate   Marry Guan, PT 09/25/2022, 6:31 PM

## 2022-09-25 ENCOUNTER — Ambulatory Visit: Payer: Medicare Other | Attending: Neurology | Admitting: Physical Therapy

## 2022-09-25 ENCOUNTER — Encounter: Payer: Self-pay | Admitting: Physical Therapy

## 2022-09-25 ENCOUNTER — Other Ambulatory Visit: Payer: Self-pay

## 2022-09-25 DIAGNOSIS — R2681 Unsteadiness on feet: Secondary | ICD-10-CM | POA: Insufficient documentation

## 2022-09-25 DIAGNOSIS — R2689 Other abnormalities of gait and mobility: Secondary | ICD-10-CM | POA: Diagnosis not present

## 2022-09-25 DIAGNOSIS — G20A1 Parkinson's disease without dyskinesia, without mention of fluctuations: Secondary | ICD-10-CM | POA: Insufficient documentation

## 2022-09-25 DIAGNOSIS — M6281 Muscle weakness (generalized): Secondary | ICD-10-CM | POA: Insufficient documentation

## 2022-09-25 DIAGNOSIS — Z9181 History of falling: Secondary | ICD-10-CM | POA: Diagnosis not present

## 2022-09-27 ENCOUNTER — Encounter: Payer: Self-pay | Admitting: Physical Therapy

## 2022-09-27 ENCOUNTER — Ambulatory Visit: Payer: Medicare Other | Admitting: Physical Therapy

## 2022-09-27 DIAGNOSIS — R2689 Other abnormalities of gait and mobility: Secondary | ICD-10-CM | POA: Diagnosis not present

## 2022-09-27 DIAGNOSIS — R2681 Unsteadiness on feet: Secondary | ICD-10-CM

## 2022-09-27 DIAGNOSIS — M6281 Muscle weakness (generalized): Secondary | ICD-10-CM | POA: Diagnosis not present

## 2022-09-27 DIAGNOSIS — G20A1 Parkinson's disease without dyskinesia, without mention of fluctuations: Secondary | ICD-10-CM | POA: Diagnosis not present

## 2022-09-27 DIAGNOSIS — Z9181 History of falling: Secondary | ICD-10-CM | POA: Diagnosis not present

## 2022-09-27 NOTE — Therapy (Signed)
OUTPATIENT PHYSICAL THERAPY TREATMENT   Patient Name: Vincent Walker MRN: [id number] DOB:1946/03/14, 77 y.o., male Today's Date: 09/27/2022   END OF SESSION:  PT End of Session - 09/27/22 1533     Visit Number 2    Date for PT Re-Evaluation 11/20/22    Authorization Type Medicare & Federal BCBS    PT Start Time 1533    PT Stop Time 1617    PT Time Calculation (min) 44 min    Activity Tolerance Patient tolerated treatment well    Behavior During Therapy [facility] for tasks assessed/performed;Flat affect             Past Medical History:  Diagnosis Date   Adenomatous polyp of ascending colon 10/09/2018   Allergies 07/07/2018   Arthritis    Benign essential hypertension 02/08/2015   Chronic renal insufficiency 02/14/2015   Constipation 12/23/2015   Dyslipidemia 02/08/2015   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 Required nasal packing a couple of days ago.  Had a similar episode of epistaxis a little over a year ago.  At that visit, I could not see the exact bleeding spot. EXAMINATION after packing removal reveals several excoriated areas anteriorly but I was unable to see t   Glaucoma    History of blood transfusion 2010   After Kidney surgery   History of chicken pox    History of renal cell carcinoma 02/08/2015   diagnosed and removed in 2010 Right Monitored by Dr Gaynelle Arabian of urology at [facility] Dr Karin Lieu, nephrology at [facility]   Hyperglycemia 12/23/2015   Hyperlipidemia    Increased thyroid stimulating hormone (TSH) level 06/07/2017   Ingrown left big toenail 02/14/2015   Left foot pain 02/14/2015   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   MVP (mitral valve prolapse) 05/14/2016   Parkinson's disease 12/23/2015   Right hip pain 12/12/2016   Thrombocytopenia 02/08/2015   Tremor of right hand 02/14/2015   Past Surgical  History:  Procedure Laterality Date   APPENDECTOMY  1995   CATARACT EXTRACTION, BILATERAL     COLONOSCOPY     COLONOSCOPY WITH PROPOFOL N/A 12/17/2018   Procedure: COLONOSCOPY WITH PROPOFOL;  Surgeon: Lemar Lofty., MD;  Location: [facility] ENDOSCOPY;  Service: Gastroenterology;  Laterality: N/A;   ENDOSCOPIC MUCOSAL RESECTION N/A 12/17/2018   Procedure: ENDOSCOPIC MUCOSAL RESECTION;  Surgeon: Meridee Score Netty Starring., MD;  Location: [facility] ENDOSCOPY;  Service: Gastroenterology;  Laterality: N/A;   HEMOSTASIS CLIP PLACEMENT  12/17/2018   Procedure: HEMOSTASIS CLIP PLACEMENT;  Surgeon: Lemar Lofty., MD;  Location: [facility] ENDOSCOPY;  Service: Gastroenterology;;   left knee scope  2003   POLYPECTOMY  12/17/2018   Procedure: POLYPECTOMY;  Surgeon: Meridee Score Netty Starring., MD;  Location: [facility] ENDOSCOPY;  Service: Gastroenterology;;   right knee scope  1999   SUBMUCOSAL LIFTING INJECTION  12/17/2018   Procedure: SUBMUCOSAL LIFTING INJECTION;  Surgeon: Lemar Lofty., MD;  Location: [facility] ENDOSCOPY;  Service: Gastroenterology;;   TOE SURGERY Left    metal 2nd toe- and top of foot- straighten bone   TONSILLECTOMY     TOTAL KNEE ARTHROPLASTY Left 07/06/2014   TOTAL NEPHRECTOMY Right    Patient Active Problem List   Diagnosis Date Noted   Flank pain 07/19/2022   Eustachian tube dysfunction, left 02/21/2021   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   Early stage nonexudative age-related macular degeneration of both  eyes 03/09/2019   Adenomatous polyp of ascending colon 10/09/2018   Epistaxis 07/07/2018   Allergies 07/07/2018   Obesity 06/15/2018   Increased thyroid stimulating hormone (TSH) level 06/07/2017   Right hip pain 12/12/2016   MVP (mitral valve prolapse) 05/14/2016   Parkinson's disease 12/23/2015   Constipation 12/23/2015   Hyperglycemia 12/23/2015   Tremor of right hand 02/14/2015   Left foot pain 02/14/2015   Chronic renal insufficiency  02/14/2015   Benign essential hypertension 02/08/2015   Dyslipidemia 02/08/2015   History of renal cell carcinoma 02/08/2015   Thrombocytopenia 02/08/2015   History of chicken pox     PCP: Bradd Canary, MD   REFERRING PROVIDER: Vladimir Faster, DO   REFERRING DIAG: G20.A1 (ICD-10-CM) - Parkinson's disease without dyskinesia or fluctuating manifestations   THERAPY DIAG:  Other abnormalities of gait and mobility  Unsteadiness on feet  Muscle weakness (generalized)  History of falling  RATIONALE FOR EVALUATION AND TREATMENT: Rehabilitation  ONSET DATE: PD diagnosis 4+ years ago   NEXT MD VISIT: 02/25/23   SUBJECTIVE:                                                                                                                                                                                                         SUBJECTIVE STATEMENT: Pt reports his knee are still but they don't hurt.   Pt accompanied by: significant other - wife [facility]) in waiting room  PAIN: Are you having pain? No  PERTINENT HISTORY:  Parkinson's disease, mild neurocognitive disorder due to PD, HTN, MVP, L TKA, R knee scope, CRI, h/o renal cancer with R nephrectomy, thrombocytopenia, dyslipidemia, B macular degeneration    PRECAUTIONS: Fall  WEIGHT BEARING RESTRICTIONS: No  FALLS:  Has patient fallen in last 6 months? Yes. Number of falls 1  LIVING ENVIRONMENT: Lives with: lives with their spouse Lives in: House/apartment Stairs: No Has following equipment at home: Single point cane, Environmental consultant - 2 wheeled, Grab bars, and Recumbent Bike  OCCUPATION: Retired  PLOF: Independent and Leisure: mostly sedentary recently (previously he had been completing HEP exercises from prior PT episode + some other exercises, bike for 20-30 min/day, work with [employer])     PATIENT GOALS: "To get going again."   OBJECTIVE: (objective measures completed at initial evaluation unless otherwise dated)  DIAGNOSTIC  FINDINGS:  07/19/22 - R hip and pelvis x-ray: FINDINGS: Mildly decreased bone mineralization. Mild bilateral sacroiliac subchondral sclerosis. The pubic symphysis joint space is maintained.   Mild bilateral superior femoroacetabular joint space narrowing. Mild-to-moderate right and mild left  superolateral acetabular degenerative osteophytes. Normal morphology of the right femoral head-neck junction without CAM-type bump deformity. No acute fracture or dislocation.   Mild atherosclerotic calcifications. Multiple vascular phleboliths again overlie the pelvis.   IMPRESSION: 1. No acute fracture. 2. 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  COGNITION: Overall cognitive status: Impaired - decreased short-term memory and recall of new information   SENSATION: WFL  COORDINATION: B heel-toe mildly diminished. Impaired heel to shin bilaterally.  EDEMA:  N/A  MUSCLE TONE: Increased LE muscle tone noted during attempts at PROM/flexibility assessment.  MUSCLE LENGTH: Hamstrings: mod tight B ITB: mild tight B Piriformis: mod tight B Hip flexors: mod tight B Quads: mild tight B Heelcord: mod tight R, mild tight L  POSTURE:  rounded shoulders, forward head, decreased lumbar lordosis, increased thoracic kyphosis, flexed trunk , and weight shift right  LOWER EXTREMITY ROM:    Mildly limited B hip and ankle ROM (R>L), mostly due to limited muscle flexibility as above.  LOWER EXTREMITY MMT:    MMT Right Eval Left Eval  Hip flexion 4+ 4+  Hip extension 4 4+  Hip abduction 4 4+  Hip adduction 4+ 4+  Hip internal rotation 5 5  Hip external rotation 4+ 4+  Knee flexion 4+ 4+  Knee extension 4+ 4+  Ankle dorsiflexion 4+ 4+  Ankle plantarflexion 4 4  Ankle inversion    Ankle eversion    (Blank rows = not tested)  BED MOBILITY:  Supine to/from sit and rolling with SBA/CGA as well as min assist to lift leg onto treatment table  TRANSFERS: Assistive  device utilized: Single point cane and None  Sit to stand: Modified independence Stand to sit: Modified independence Chair to chair: Modified independence Floor:  NT  GAIT: Distance walked: 120 ft Assistive device utilized: Single point cane and None Level of assistance: SBA Gait pattern: decreased arm swing- Right, decreased arm swing- Left, decreased stride length, decreased hip/knee flexion- Right, decreased hip/knee flexion- Left, decreased trunk rotation, trunk flexed, poor foot clearance- Right, and poor foot clearance- Left Comments: Stooped posture with decreased reciprocal movement patterns and decreased stride length along with poor sequencing of cane  RAMP: Level of Assistance:  NT Assistive device utilized:  NT Ramp Comments:   CURB:  Level of Assistance:  NT Assistive device utilized:  NT Curb Comments:   STAIRS:  Level of Assistance: SBA  Stair Negotiation Technique: Alternating Pattern  with Single Rail on Right  Number of Stairs: 14   Height of Stairs: 7"  Comments: Patient is very hesitant on stairs with 2 hands on rail for most of ascent and descent  FUNCTIONAL TESTS:  5 times sit to stand: 13.03 sec with B UE assist, unable to achieve full stand w/o UE assist Timed up and go (TUG): Normal = 11.06 sec, Manual = 12.09 sec, Cognitive = 14.56 sec 10 meter walk test: 11.07 sec w/o AD, 10.88 sec with SPC; Gait speed = 2.96 ft/sec w/o AD, 3.01 ft/sec with SPC Berg Balance Scale: 43/56, 37-45 significant risk for falls (>80%) Functional gait assessment: 15/30; < 19 = high risk fall   Baseline as of 04/10/22 PT PD Screen: 5xSTS: 15.90 sec - pt notes limited by knees TUG: 10.37 sec (Normal), 12.16 sec (Manual), 12.93 sec (Cognitive) : 9.75 sec Gait speed = 3.36 ft/sec FGA = 20/30; 19-24 = medium risk fall   Baseline as of 09/26/21 PT PD Screen: 5xSTS: 12.47 sec TUG: 7.91 sec (Normal), 8.87 sec (Manual), 10.81 sec (  Cognitive) : 7.69 sec  Gait speed =  4.27 ft/sec  Baseline as of D/C from last PT episode (04/06/21): 5xSTS: 12.65 sec TUG: 8.47 sec (Normal), 10.38 sec (Manual), 10.66 (Cognitive) : 6.66 sec Gait speed = 4.92 ft/sec  Berg: 53/56; 52-55 = lower risk for falls (> 25%)  FGA = 25/30; 25-28 = low risk fall   PATIENT SURVEYS:  ABC scale 1060 / 1600 = 66.3 %   TODAY'S TREATMENT:   09/27/22 THERAPEUTIC ACTIVITIES: Sharlene Motts: 43/56, 37-45 significant risk for falls (>80%)   THERAPEUTIC EXERCISE: to improve flexibility, strength and mobility.  Demonstration, verbal and tactile cues throughout for technique. NuStep - L3 x 6 min (UE/LE to promote improved reciprocal movement patterns) Hooklying LTR -reviewed and demonstrated from HEP from prior PT episode but not attempted today Seated thoracolumbar extension with pectoralis stretch over back of chair 5 x 5" Seated hip hinge HS stretch with foot on 9 inch stool x 30" bil Seated figure-4 piriformis stretch x 30" bil Seated KTOS piriformis stretch x 30" bil Seated lunge position hip flexor stretch over side of the chair x 30" bil Standing lunge position hip flexor and gastroc stretch with UE support on wall x 30" bil - cues to keep upright torso to facilitate hip flexor stretch   09/25/22 Initial eval only   PATIENT EDUCATION:  Education details: PT eval findings and initial HEP  Person educated: Patient Education method: Explanation, Demonstration, Verbal cues, and Handouts Education comprehension: verbalized understanding, returned demonstration, verbal cues required, and needs further education  HOME EXERCISE PROGRAM: Access Code: [email or web address] URL: [email or web address] Date: 09/27/2022 Prepared by: Glenetta Hew  Exercises - Supine Lower Trunk Rotation  - 2 x daily - 7 x weekly - 2 sets - 10 reps - 10 sec hold - Seated Thoracic Lumbar Extension with Pectoralis Stretch  - 2 x daily - 7 x weekly - 2 sets - 10 reps - 5 sec hold - Seated Hamstring Stretch  - 2  x daily - 7 x weekly - 3 reps - 30 sec hold - Seated Figure 4 Piriformis Stretch  - 2 x daily - 7 x weekly - 3 reps - 30 sec hold - Seated Piriformis Stretch  - 2 x daily - 7 x weekly - 3 reps - 30 sec hold - Seated Hip Flexor Stretch  - 2 x daily - 7 x weekly - 3 reps - 30 sec hold - Standing Gastroc Stretch at Counter  - 2 x daily - 7 x weekly - 3 reps - 30 sec hold   ASSESSMENT:  CLINICAL IMPRESSION: Completed Berg balance test with score of 43/56 revealing 10 point decline from score at discharge from last PT episode and indicating a significant risk for falls (>80%).  Initial HEP created focusing on postural and lumbopelvic stretching to improve flexibility for increased ease of mobility.  Stretches performed in sitting today for increased ease and convenience of performance at home, however patient educated that supine alternatives are available similar to what was performed as part of HEP in last therapy session if the seated options do not work.  Jairo was able to perform good return demonstration of all new stretches today.  OBJECTIVE IMPAIRMENTS: Abnormal gait, decreased activity tolerance, decreased balance, decreased knowledge of condition, decreased knowledge of use of DME, decreased mobility, difficulty walking, decreased ROM, decreased strength, decreased safety awareness, increased fascial restrictions, impaired perceived functional ability, impaired flexibility, improper body mechanics, and postural dysfunction.   ACTIVITY LIMITATIONS: bending, standing, squatting,  stairs, transfers, bed mobility, locomotion level, and caring for others  PARTICIPATION LIMITATIONS: driving, shopping, community activity, and yard work  PERSONAL FACTORS: Age, Behavior pattern, Fitness, Past/current experiences, Time since onset of injury/illness/exacerbation, and 3+ comorbidities: Parkinson's disease, mild neurocognitive disorder due to PD, HTN, MVP, L TKA, R knee scope, CRI, h/o renal cancer with R  nephrectomy, thrombocytopenia, dyslipidemia, B macular degeneration  are also affecting patient's functional outcome.   REHAB POTENTIAL: Good  CLINICAL DECISION MAKING: Evolving/moderate complexity  EVALUATION COMPLEXITY: Moderate   GOALS: Goals reviewed with patient? Yes  SHORT TERM GOALS: Target date: 10/23/2022  Patient will be independent with initial HEP. Baseline: TBD Goal status: IN PROGRESS  2.  Patient will demonstrate appropriate sequencing of SPC or LRAD with gait for improved balance and decrease fall risk. Baseline:  Goal status: IN PROGRESS  3.  Patient will verbalize tips to reduce freezing/festination with gait and turns. Baseline:  Goal status: IN PROGRESS  LONG TERM GOALS: Target date: 11/20/2022  Patient will be independent with ongoing/advanced HEP for self-management at home incorporating PWR! Moves as indicated .  Baseline:  Goal status: IN PROGRESS  2.  Patient will be able to ambulate 600' with or w/o LRAD with good safety to access community.  Baseline:  Goal status: IN PROGRESS  3.  Patient will be able to step up/down curb safely with or w/o LRAD for safety with community ambulation.  Baseline:  Goal status: IN PROGRESS   4.  Patient will demonstrate gait speed of >/= 3.9 ft/sec (1.2 m/s) to be a safe community ambulator including ability to safely cross the street.  Baseline: 2.96 ft/sec w/o AD, 3.01 ft/sec with SPC Goal status: IN PROGRESS  5.  Patient will improve 5x STS time to </= 16 seconds w/o need for UE assist to demonstrate improved functional strength and transfer efficiency. Baseline: 13.03 sec with B UE assist, unable to achieve full stand w/o UE assist Goal status: IN PROGRESS  6.  Patient will demonstrate at least 19/30 on FGA to improve gait stability and reduce risk for falls. (MCID = 4 points) Baseline: 15/30 Goal status: IN PROGRESS  7.  Patient will improve Berg score by at least 8 points or to >/= 52/56 to improve  safety and stability with ADLs in standing and reduce risk for falls. (MCID = 8 points)   Baseline: TBD Goal status: IN PROGRESS  8.  Patient will report >/= 75% on ABC scale to demonstrate improved balance confidence and decreased risk for falls. Baseline: 1060 / 1600 = 66.3 % Goal status: IN PROGRESS  9. Patient will verbalize understanding of local Parkinson's disease community resources, including community fitness post d/c. Baseline:  Goal status: IN PROGRESS   PLAN:  PT FREQUENCY: 2x/week  PT DURATION: 6-8 weeks (POC for 8 weeks due to patient on vacation x 2 weeks)  PLANNED INTERVENTIONS: Therapeutic exercises, Therapeutic activity, Neuromuscular re-education, Balance training, Gait training, Patient/Family education, Self Care, Joint mobilization, Stair training, DME instructions, Aquatic Therapy, Electrical stimulation, Cryotherapy, Moist heat, Taping, Ultrasound, Manual therapy, and Re-evaluation  PLAN FOR NEXT SESSION: Review initial HEP and modify if needed; progress lumbopelvic flexibility and strengthening; initiate PWR! Moves as appropriate   Marry Guan, PT 09/27/2022, 4:28 PM

## 2022-10-02 ENCOUNTER — Encounter: Payer: Medicare Other | Admitting: Physical Therapy

## 2022-10-05 ENCOUNTER — Other Ambulatory Visit (HOSPITAL_BASED_OUTPATIENT_CLINIC_OR_DEPARTMENT_OTHER): Payer: Self-pay

## 2022-10-08 ENCOUNTER — Other Ambulatory Visit (HOSPITAL_BASED_OUTPATIENT_CLINIC_OR_DEPARTMENT_OTHER): Payer: Self-pay

## 2022-10-09 ENCOUNTER — Encounter: Payer: Self-pay | Admitting: Physical Therapy

## 2022-10-09 ENCOUNTER — Ambulatory Visit: Payer: Medicare Other | Attending: Neurology | Admitting: Physical Therapy

## 2022-10-09 DIAGNOSIS — M6281 Muscle weakness (generalized): Secondary | ICD-10-CM | POA: Diagnosis not present

## 2022-10-09 DIAGNOSIS — R2681 Unsteadiness on feet: Secondary | ICD-10-CM | POA: Insufficient documentation

## 2022-10-09 DIAGNOSIS — R2689 Other abnormalities of gait and mobility: Secondary | ICD-10-CM | POA: Insufficient documentation

## 2022-10-09 DIAGNOSIS — Z9181 History of falling: Secondary | ICD-10-CM | POA: Diagnosis not present

## 2022-10-09 NOTE — Therapy (Signed)
OUTPATIENT PHYSICAL THERAPY TREATMENT   Patient Name: Vincent Walker MRN: [id number] DOB:[birth year], 77 y.o., male Today's Date: 10/09/2022   END OF SESSION:  PT End of Session - 10/09/22 1315     Visit Number 3    Date for PT Re-Evaluation 11/20/22    Authorization Type Medicare & Federal BCBS    PT Start Time 1315    PT Stop Time 1402    PT Time Calculation (min) 47 min    Activity Tolerance Patient tolerated treatment well    Behavior During Therapy [facility] for tasks assessed/performed;Flat affect              Past Medical History:  Diagnosis Date   Adenomatous polyp of ascending colon 10/09/2018   Allergies 07/07/2018   Arthritis    Benign essential hypertension 02/08/2015   Chronic renal insufficiency 02/14/2015   Constipation 12/23/2015   Dyslipidemia 02/08/2015   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 Required nasal packing a couple of days ago.  Had a similar episode of epistaxis a little over a year ago.  At that visit, I could not see the exact bleeding spot. EXAMINATION after packing removal reveals several excoriated areas anteriorly but I was unable to see t   Glaucoma    History of blood transfusion 2010   After Kidney surgery   History of chicken pox    History of renal cell carcinoma 02/08/2015   diagnosed and removed in 2010 Right Monitored by Dr Gaynelle Arabian of urology at [facility] Dr Karin Lieu, nephrology at [facility]   Hyperglycemia 12/23/2015   Hyperlipidemia    Increased thyroid stimulating hormone (TSH) level 06/07/2017   Ingrown left big toenail 02/14/2015   Left foot pain 02/14/2015   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   MVP (mitral valve prolapse) 05/14/2016   Parkinson's disease 12/23/2015   Right hip pain 12/12/2016   Thrombocytopenia 02/08/2015   Tremor of right hand 02/14/2015   Past Surgical  History:  Procedure Laterality Date   APPENDECTOMY  1995   CATARACT EXTRACTION, BILATERAL     COLONOSCOPY     COLONOSCOPY WITH PROPOFOL N/A 12/17/2018   Procedure: COLONOSCOPY WITH PROPOFOL;  Surgeon: Lemar Lofty., MD;  Location: [facility] ENDOSCOPY;  Service: Gastroenterology;  Laterality: N/A;   ENDOSCOPIC MUCOSAL RESECTION N/A 12/17/2018   Procedure: ENDOSCOPIC MUCOSAL RESECTION;  Surgeon: Meridee Score Netty Starring., MD;  Location: [facility] ENDOSCOPY;  Service: Gastroenterology;  Laterality: N/A;   HEMOSTASIS CLIP PLACEMENT  12/17/2018   Procedure: HEMOSTASIS CLIP PLACEMENT;  Surgeon: Lemar Lofty., MD;  Location: [facility] ENDOSCOPY;  Service: Gastroenterology;;   left knee scope  2003   POLYPECTOMY  12/17/2018   Procedure: POLYPECTOMY;  Surgeon: Meridee Score Netty Starring., MD;  Location: [facility] ENDOSCOPY;  Service: Gastroenterology;;   right knee scope  1999   SUBMUCOSAL LIFTING INJECTION  12/17/2018   Procedure: SUBMUCOSAL LIFTING INJECTION;  Surgeon: Lemar Lofty., MD;  Location: [facility] ENDOSCOPY;  Service: Gastroenterology;;   TOE SURGERY Left    metal 2nd toe- and top of foot- straighten bone   TONSILLECTOMY     TOTAL KNEE ARTHROPLASTY Left 07/06/2014   TOTAL NEPHRECTOMY Right    Patient Active Problem List   Diagnosis Date Noted   Flank pain 07/19/2022   Eustachian tube dysfunction, left 02/21/2021   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   Early stage nonexudative age-related macular degeneration of  both eyes 03/09/2019   Adenomatous polyp of ascending colon 10/09/2018   Epistaxis 07/07/2018   Allergies 07/07/2018   Obesity 06/15/2018   Increased thyroid stimulating hormone (TSH) level 06/07/2017   Right hip pain 12/12/2016   MVP (mitral valve prolapse) 05/14/2016   Parkinson's disease 12/23/2015   Constipation 12/23/2015   Hyperglycemia 12/23/2015   Tremor of right hand 02/14/2015   Left foot pain 02/14/2015   Chronic renal insufficiency  02/14/2015   Benign essential hypertension 02/08/2015   Dyslipidemia 02/08/2015   History of renal cell carcinoma 02/08/2015   Thrombocytopenia 02/08/2015   History of chicken pox     PCP: Bradd Canary, MD   REFERRING PROVIDER: Vladimir Faster, DO   REFERRING DIAG: G20.A1 (ICD-10-CM) - Parkinson's disease without dyskinesia or fluctuating manifestations   THERAPY DIAG:  Other abnormalities of gait and mobility  Unsteadiness on feet  Muscle weakness (generalized)  History of falling  RATIONALE FOR EVALUATION AND TREATMENT: Rehabilitation  ONSET DATE: PD diagnosis 4+ years ago   NEXT MD VISIT: 02/25/23   SUBJECTIVE:                                                                                                                                                                                                         SUBJECTIVE STATEMENT: Pt reports his back is sore today - triggered by longer trips in the car which he did multiple times last week as he had to come back early from the beach when his daughter was admitted to the hospital.   Pt accompanied by: significant other - wife Geraldine Contras) in waiting room  PAIN: Are you having pain? Yes: NPRS scale: 7/10 Pain location: B low back Pain description: sore Aggravating factors: riding in a car for any length of time Relieving factors: lying flat on the floor  PERTINENT HISTORY:  Parkinson's disease, mild neurocognitive disorder due to PD, HTN, MVP, L TKA, R knee scope, CRI, h/o renal cancer with R nephrectomy, thrombocytopenia, dyslipidemia, B macular degeneration    PRECAUTIONS: Fall  WEIGHT BEARING RESTRICTIONS: No  FALLS:  Has patient fallen in last 6 months? Yes. Number of falls 1  LIVING ENVIRONMENT: Lives with: lives with their spouse Lives in: House/apartment Stairs: No Has following equipment at home: Single point cane, Environmental consultant - 2 wheeled, Grab bars, and Recumbent Bike  OCCUPATION: Retired  PLOF: Independent  and Leisure: mostly sedentary recently (previously he had been completing HEP exercises from prior PT episode + some other exercises, bike for 20-30 min/day, work with [employer])  PATIENT GOALS: "To get going again."   OBJECTIVE: (objective measures completed at initial evaluation unless otherwise dated)  DIAGNOSTIC FINDINGS:  07/19/22 - R hip and pelvis x-ray: FINDINGS: Mildly decreased bone mineralization. Mild bilateral sacroiliac subchondral sclerosis. The pubic symphysis joint space is maintained.   Mild bilateral superior femoroacetabular joint space narrowing. Mild-to-moderate right and mild left superolateral acetabular degenerative osteophytes. Normal morphology of the right femoral head-neck junction without CAM-type bump deformity. No acute fracture or dislocation.   Mild atherosclerotic calcifications. Multiple vascular phleboliths again overlie the pelvis.   IMPRESSION: 1. No acute fracture. 2. 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  COGNITION: Overall cognitive status: Impaired - decreased short-term memory and recall of new information   SENSATION: WFL  COORDINATION: B heel-toe mildly diminished. Impaired heel to shin bilaterally.  EDEMA:  N/A  MUSCLE TONE: Increased LE muscle tone noted during attempts at PROM/flexibility assessment.  MUSCLE LENGTH: Hamstrings: mod tight B ITB: mild tight B Piriformis: mod tight B Hip flexors: mod tight B Quads: mild tight B Heelcord: mod tight R, mild tight L  POSTURE:  rounded shoulders, forward head, decreased lumbar lordosis, increased thoracic kyphosis, flexed trunk , and weight shift right  LOWER EXTREMITY ROM:    Mildly limited B hip and ankle ROM (R>L), mostly due to limited muscle flexibility as above.  LOWER EXTREMITY MMT:    MMT Right Eval Left Eval  Hip flexion 4+ 4+  Hip extension 4 4+  Hip abduction 4 4+  Hip adduction 4+ 4+  Hip internal rotation 5 5  Hip  external rotation 4+ 4+  Knee flexion 4+ 4+  Knee extension 4+ 4+  Ankle dorsiflexion 4+ 4+  Ankle plantarflexion 4 4  Ankle inversion    Ankle eversion    (Blank rows = not tested)  BED MOBILITY:  Supine to/from sit and rolling with SBA/CGA as well as min assist to lift leg onto treatment table  TRANSFERS: Assistive device utilized: Single point cane and None  Sit to stand: Modified independence Stand to sit: Modified independence Chair to chair: Modified independence Floor:  NT  GAIT: Distance walked: 120 ft Assistive device utilized: Single point cane and None Level of assistance: SBA Gait pattern: decreased arm swing- Right, decreased arm swing- Left, decreased stride length, decreased hip/knee flexion- Right, decreased hip/knee flexion- Left, decreased trunk rotation, trunk flexed, poor foot clearance- Right, and poor foot clearance- Left Comments: Stooped posture with decreased reciprocal movement patterns and decreased stride length along with poor sequencing of cane  RAMP: Level of Assistance:  NT Assistive device utilized:  NT Ramp Comments:   CURB:  Level of Assistance:  NT Assistive device utilized:  NT Curb Comments:   STAIRS:  Level of Assistance: SBA  Stair Negotiation Technique: Alternating Pattern  with Single Rail on Right  Number of Stairs: 14   Height of Stairs: 7"  Comments: Patient is very hesitant on stairs with 2 hands on rail for most of ascent and descent  FUNCTIONAL TESTS:  5 times sit to stand: 13.03 sec with B UE assist, unable to achieve full stand w/o UE assist Timed up and go (TUG): Normal = 11.06 sec, Manual = 12.09 sec, Cognitive = 14.56 sec 10 meter walk test: 11.07 sec w/o AD, 10.88 sec with SPC; Gait speed = 2.96 ft/sec w/o AD, 3.01 ft/sec with SPC Berg Balance Scale: 43/56, 37-45 significant risk for falls (>80%) Functional gait assessment: 15/30; < 19 = high risk fall   Baseline as of  04/10/22 PT PD Screen: 5xSTS: 15.90 sec - pt  notes limited by knees TUG: 10.37 sec (Normal), 12.16 sec (Manual), 12.93 sec (Cognitive) : 9.75 sec Gait speed = 3.36 ft/sec FGA = 20/30; 19-24 = medium risk fall   Baseline as of 09/26/21 PT PD Screen: 5xSTS: 12.47 sec TUG: 7.91 sec (Normal), 8.87 sec (Manual), 10.81 sec (Cognitive) : 7.69 sec  Gait speed = 4.27 ft/sec  Baseline as of D/C from last PT episode (04/06/21): 5xSTS: 12.65 sec TUG: 8.47 sec (Normal), 10.38 sec (Manual), 10.66 (Cognitive) : 6.66 sec Gait speed = 4.92 ft/sec  Berg: 53/56; 52-55 = lower risk for falls (> 25%)  FGA = 25/30; 25-28 = low risk fall   PATIENT SURVEYS:  ABC scale 1060 / 1600 = 66.3 %   TODAY'S TREATMENT:   10/09/22 THERAPEUTIC EXERCISE: to improve flexibility, strength and mobility.  Demonstration, verbal and tactile cues throughout for technique.  NuStep - L4 x 6 min (UE/LE to promote improved reciprocal movement patterns) Seated hip hinge HS stretch with foot on 9 inch stool x 30" bil Seated figure-4 piriformis stretch x 30" bil Seated KTOS piriformis stretch x 30" bil Seated lunge position hip flexor stretch over side of the chair x 30" bil, seat height elevated with Airex pad Standing lunge position hip flexor and gastroc stretch with UE support on counter x 30" bil - cues to keep upright torso to facilitate hip flexor stretch Seated thoracolumbar extension with pectoralis stretch over back of chair 5 x 10" Hooklying LTR 10 x 5"  NEUROMUSCULAR RE-EDUCATION: To improve posture, coordination, amplitude of movement, speed of movement to reduce bradykinesia, and reduce rigidity.  Supine PWR! Up, [place], [place] & Step x 10    09/27/22 THERAPEUTIC ACTIVITIES: Sharlene Motts: 43/56, 37-45 significant risk for falls (>80%)   THERAPEUTIC EXERCISE: to improve flexibility, strength and mobility.  Demonstration, verbal and tactile cues throughout for technique. NuStep - L3 x 6 min (UE/LE to promote improved reciprocal movement  patterns) Hooklying LTR - reviewed and demonstrated from HEP from prior PT episode but not attempted today Seated thoracolumbar extension with pectoralis stretch over back of chair 5 x 5" Seated hip hinge HS stretch with foot on 9 inch stool x 30" bil Seated figure-4 piriformis stretch x 30" bil Seated KTOS piriformis stretch x 30" bil Seated lunge position hip flexor stretch over side of the chair x 30" bil Standing lunge position hip flexor and gastroc stretch with UE support on wall x 30" bil - cues to keep upright torso to facilitate hip flexor stretch   09/25/22 Initial eval only   PATIENT EDUCATION:  Education details: HEP review and PWR! Moves - Supine   Person educated: Patient Education method: Explanation, Demonstration, Verbal cues, and Handouts Education comprehension: verbalized understanding, returned demonstration, verbal cues required, and needs further education  HOME EXERCISE PROGRAM: Access Code: [email or web address] URL: [email or web address] Date: 10/09/2022 Prepared by: Glenetta Hew  Exercises - Supine Lower Trunk Rotation  - 2 x daily - 7 x weekly - 2 sets - 10 reps - 10 sec hold - Seated Thoracic Lumbar Extension with Pectoralis Stretch  - 2 x daily - 7 x weekly - 2 sets - 10 reps - 5 sec hold - Seated Hamstring Stretch  - 2 x daily - 7 x weekly - 3 reps - 30 sec hold - Seated Figure 4 Piriformis Stretch  - 2 x daily - 7 x weekly - 3 reps - 30 sec hold - Seated Piriformis Stretch  -  2 x daily - 7 x weekly - 3 reps - 30 sec hold - Seated Hip Flexor Stretch  - 2 x daily - 7 x weekly - 3 reps - 30 sec hold - Standing Gastroc Stretch at Counter  - 2 x daily - 7 x weekly - 3 reps - 30 sec hold  PWR! Moves: - Supine  ASSESSMENT:  CLINICAL IMPRESSION: Matyas reports increased LBP today, most likely aggravated by long car trips last week. Initial HEP reviewed with minor clarifications provided but otherwise good return demonstration and good tolerance with no  increased pain.  Given c/o back pain reported alleviating factor of lying flat, initiated PWR! Moves in supine today. VC's and repeat demonstration required for desired movement patterns during PWR! Moves and further review/training indicated, however pt requesting handout to allow him to practice at home.   OBJECTIVE IMPAIRMENTS: Abnormal gait, decreased activity tolerance, decreased balance, decreased knowledge of condition, decreased knowledge of use of DME, decreased mobility, difficulty walking, decreased ROM, decreased strength, decreased safety awareness, increased fascial restrictions, impaired perceived functional ability, impaired flexibility, improper body mechanics, and postural dysfunction.   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   REHAB POTENTIAL: Good  CLINICAL DECISION MAKING: Evolving/moderate complexity  EVALUATION COMPLEXITY: Moderate   GOALS: Goals reviewed with patient? Yes  SHORT TERM GOALS: Target date: 10/23/2022  Patient will be independent with initial HEP. Baseline: TBD Goal status: IN PROGRESS  2.  Patient will demonstrate appropriate sequencing of SPC or LRAD with gait for improved balance and decrease fall risk. Baseline:  Goal status: IN PROGRESS  3.  Patient will verbalize tips to reduce freezing/festination with gait and turns. Baseline:  Goal status: IN PROGRESS  LONG TERM GOALS: Target date:  11/20/2022  Patient will be independent with ongoing/advanced HEP for self-management at home incorporating PWR! Moves as indicated .  Baseline:  Goal status: IN PROGRESS  2.  Patient will be able to ambulate 600' with or w/o LRAD with good safety to access community.  Baseline:  Goal status: IN PROGRESS  3.  Patient will be able to step up/down curb safely with or w/o LRAD for safety with community ambulation.  Baseline:  Goal status: IN PROGRESS   4.  Patient will demonstrate gait speed of >/= 3.9 ft/sec (1.2 m/s) to be a safe community ambulator including ability to safely cross the street.  Baseline: 2.96 ft/sec w/o AD, 3.01 ft/sec with SPC Goal status: IN PROGRESS  5.  Patient will improve 5x STS time to </= 16 seconds w/o need for UE assist to demonstrate improved functional strength and transfer efficiency. Baseline: 13.03 sec with B UE assist, unable to achieve full stand w/o UE assist Goal status: IN PROGRESS  6.  Patient will demonstrate at least 19/30 on FGA to improve gait stability and reduce risk for falls. (MCID = 4 points) Baseline: 15/30 Goal status: IN PROGRESS  7.  Patient will improve Berg score by at least 8 points or to >/= 52/56 to improve safety and stability with ADLs in standing and reduce risk for falls. (MCID = 8  points)   Baseline: TBD Goal status: IN PROGRESS  8.  Patient will report >/= 75% on ABC scale to demonstrate improved balance confidence and decreased risk for falls. Baseline: 1060 / 1600 = 66.3 % Goal status: IN PROGRESS  9. Patient will verbalize understanding of local Parkinson's disease community resources, including community fitness post d/c. Baseline:  Goal status: IN PROGRESS   PLAN:  PT FREQUENCY: 2x/week  PT DURATION: 6-8 weeks (POC for 8 weeks due to patient on vacation x 2 weeks)  PLANNED INTERVENTIONS: Therapeutic exercises, Therapeutic activity, Neuromuscular re-education, Balance training, Gait training, Patient/Family  education, Self Care, Joint mobilization, Stair training, DME instructions, Aquatic Therapy, Electrical stimulation, Cryotherapy, Moist heat, Taping, Ultrasound, Manual therapy, and Re-evaluation  PLAN FOR NEXT SESSION: Progress lumbopelvic flexibility and strengthening - review and update HEP PRN; progress PWR! Moves as appropriate   Marry Guan, PT 10/09/2022, 2:03 PM

## 2022-10-11 ENCOUNTER — Ambulatory Visit: Payer: Medicare Other | Admitting: Physical Therapy

## 2022-10-16 ENCOUNTER — Ambulatory Visit: Payer: Medicare Other | Admitting: Physical Therapy

## 2022-10-16 ENCOUNTER — Encounter: Payer: Self-pay | Admitting: Physical Therapy

## 2022-10-16 DIAGNOSIS — R2689 Other abnormalities of gait and mobility: Secondary | ICD-10-CM

## 2022-10-16 DIAGNOSIS — Z9181 History of falling: Secondary | ICD-10-CM | POA: Diagnosis not present

## 2022-10-16 DIAGNOSIS — M6281 Muscle weakness (generalized): Secondary | ICD-10-CM | POA: Diagnosis not present

## 2022-10-16 DIAGNOSIS — R2681 Unsteadiness on feet: Secondary | ICD-10-CM

## 2022-10-16 NOTE — Therapy (Signed)
OUTPATIENT PHYSICAL THERAPY TREATMENT   Patient Name: Vincent Walker MRN: [id number] DOB:29-Oct-1945, 77 y.o., male Today's Date: 10/16/2022   END OF SESSION:  PT End of Session - 10/16/22 1317     Visit Number 4    Date for PT Re-Evaluation 11/20/22    Authorization Type Medicare & Federal BCBS    PT Start Time 1317    PT Stop Time 1400    PT Time Calculation (min) 43 min    Activity Tolerance Patient tolerated treatment well    Behavior During Therapy [facility] for tasks assessed/performed;Flat affect               Past Medical History:  Diagnosis Date   Adenomatous polyp of ascending colon 10/09/2018   Allergies 07/07/2018   Arthritis    Benign essential hypertension 02/08/2015   Chronic renal insufficiency 02/14/2015   Constipation 12/23/2015   Dyslipidemia 02/08/2015   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 Required nasal packing a couple of days ago.  Had a similar episode of epistaxis a little over a year ago.  At that visit, I could not see the exact bleeding spot. EXAMINATION after packing removal reveals several excoriated areas anteriorly but I was unable to see t   Glaucoma    History of blood transfusion 2010   After Kidney surgery   History of chicken pox    History of renal cell carcinoma 02/08/2015   diagnosed and removed in 2010 Right Monitored by Dr Gaynelle Arabian of urology at [facility] Dr Karin Lieu, nephrology at [facility]   Hyperglycemia 12/23/2015   Hyperlipidemia    Increased thyroid stimulating hormone (TSH) level 06/07/2017   Ingrown left big toenail 02/14/2015   Left foot pain 02/14/2015   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   MVP (mitral valve prolapse) 05/14/2016   Parkinson's disease 12/23/2015   Right hip pain 12/12/2016   Thrombocytopenia 02/08/2015   Tremor of right hand 02/14/2015   Past Surgical  History:  Procedure Laterality Date   APPENDECTOMY  1995   CATARACT EXTRACTION, BILATERAL     COLONOSCOPY     COLONOSCOPY WITH PROPOFOL N/A 12/17/2018   Procedure: COLONOSCOPY WITH PROPOFOL;  Surgeon: Lemar Lofty., MD;  Location: [facility] ENDOSCOPY;  Service: Gastroenterology;  Laterality: N/A;   ENDOSCOPIC MUCOSAL RESECTION N/A 12/17/2018   Procedure: ENDOSCOPIC MUCOSAL RESECTION;  Surgeon: Meridee Score Netty Starring., MD;  Location: [facility] ENDOSCOPY;  Service: Gastroenterology;  Laterality: N/A;   HEMOSTASIS CLIP PLACEMENT  12/17/2018   Procedure: HEMOSTASIS CLIP PLACEMENT;  Surgeon: Lemar Lofty., MD;  Location: [facility] ENDOSCOPY;  Service: Gastroenterology;;   left knee scope  2003   POLYPECTOMY  12/17/2018   Procedure: POLYPECTOMY;  Surgeon: Meridee Score Netty Starring., MD;  Location: [facility] ENDOSCOPY;  Service: Gastroenterology;;   right knee scope  1999   SUBMUCOSAL LIFTING INJECTION  12/17/2018   Procedure: SUBMUCOSAL LIFTING INJECTION;  Surgeon: Lemar Lofty., MD;  Location: [facility] ENDOSCOPY;  Service: Gastroenterology;;   TOE SURGERY Left    metal 2nd toe- and top of foot- straighten bone   TONSILLECTOMY     TOTAL KNEE ARTHROPLASTY Left 07/06/2014   TOTAL NEPHRECTOMY Right    Patient Active Problem List   Diagnosis Date Noted   Flank pain 07/19/2022   Eustachian tube dysfunction, left 02/21/2021   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   Early stage nonexudative age-related macular degeneration  of both eyes 03/09/2019   Adenomatous polyp of ascending colon 10/09/2018   Epistaxis 07/07/2018   Allergies 07/07/2018   Obesity 06/15/2018   Increased thyroid stimulating hormone (TSH) level 06/07/2017   Right hip pain 12/12/2016   MVP (mitral valve prolapse) 05/14/2016   Parkinson's disease 12/23/2015   Constipation 12/23/2015   Hyperglycemia 12/23/2015   Tremor of right hand 02/14/2015   Left foot pain 02/14/2015   Chronic renal insufficiency  02/14/2015   Benign essential hypertension 02/08/2015   Dyslipidemia 02/08/2015   History of renal cell carcinoma 02/08/2015   Thrombocytopenia 02/08/2015   History of chicken pox     PCP: Bradd Canary, MD   REFERRING PROVIDER: Vladimir Faster, DO   REFERRING DIAG: G20.A1 (ICD-10-CM) - Parkinson's disease without dyskinesia or fluctuating manifestations   THERAPY DIAG:  Other abnormalities of gait and mobility  Unsteadiness on feet  Muscle weakness (generalized)  History of falling  RATIONALE FOR EVALUATION AND TREATMENT: Rehabilitation  ONSET DATE: PD diagnosis 4+ years ago   NEXT MD VISIT: 02/25/23   SUBJECTIVE:                                                                                                                                                                                                         SUBJECTIVE STATEMENT: Pt reports no new concerns today.   Pt accompanied by: significant other - wife [facility]) in waiting room  PAIN: Are you having pain? No  PERTINENT HISTORY:  Parkinson's disease, mild neurocognitive disorder due to PD, HTN, MVP, L TKA, R knee scope, CRI, h/o renal cancer with R nephrectomy, thrombocytopenia, dyslipidemia, B macular degeneration    PRECAUTIONS: Fall  WEIGHT BEARING RESTRICTIONS: No  FALLS:  Has patient fallen in last 6 months? Yes. Number of falls 1  LIVING ENVIRONMENT: Lives with: lives with their spouse Lives in: House/apartment Stairs: No Has following equipment at home: Single point cane, Environmental consultant - 2 wheeled, Grab bars, and Recumbent Bike  OCCUPATION: Retired  PLOF: Independent and Leisure: mostly sedentary recently (previously he had been completing HEP exercises from prior PT episode + some other exercises, bike for 20-30 min/day, work with [employer])     PATIENT GOALS: "To get going again."   OBJECTIVE: (objective measures completed at initial evaluation unless otherwise dated)  DIAGNOSTIC FINDINGS:   07/19/22 - R hip and pelvis x-ray: FINDINGS: Mildly decreased bone mineralization. Mild bilateral sacroiliac subchondral sclerosis. The pubic symphysis joint space is maintained.   Mild bilateral superior femoroacetabular joint space narrowing. Mild-to-moderate right and mild left superolateral acetabular  degenerative osteophytes. Normal morphology of the right femoral head-neck junction without CAM-type bump deformity. No acute fracture or dislocation.   Mild atherosclerotic calcifications. Multiple vascular phleboliths again overlie the pelvis.   IMPRESSION: 1. No acute fracture. 2. 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  COGNITION: Overall cognitive status: Impaired - decreased short-term memory and recall of new information   SENSATION: WFL  COORDINATION: B heel-toe mildly diminished. Impaired heel to shin bilaterally.  EDEMA:  N/A  MUSCLE TONE: Increased LE muscle tone noted during attempts at PROM/flexibility assessment.  MUSCLE LENGTH: Hamstrings: mod tight B ITB: mild tight B Piriformis: mod tight B Hip flexors: mod tight B Quads: mild tight B Heelcord: mod tight R, mild tight L  POSTURE:  rounded shoulders, forward head, decreased lumbar lordosis, increased thoracic kyphosis, flexed trunk , and weight shift right  LOWER EXTREMITY ROM:    Mildly limited B hip and ankle ROM (R>L), mostly due to limited muscle flexibility as above.  LOWER EXTREMITY MMT:    MMT Right Eval Left Eval  Hip flexion 4+ 4+  Hip extension 4 4+  Hip abduction 4 4+  Hip adduction 4+ 4+  Hip internal rotation 5 5  Hip external rotation 4+ 4+  Knee flexion 4+ 4+  Knee extension 4+ 4+  Ankle dorsiflexion 4+ 4+  Ankle plantarflexion 4 4  Ankle inversion    Ankle eversion    (Blank rows = not tested)  BED MOBILITY:  Supine to/from sit and rolling with SBA/CGA as well as min assist to lift leg onto treatment table  TRANSFERS: Assistive device  utilized: Single point cane and None  Sit to stand: Modified independence Stand to sit: Modified independence Chair to chair: Modified independence Floor:  NT  GAIT: Distance walked: 120 ft Assistive device utilized: Single point cane and None Level of assistance: SBA Gait pattern: decreased arm swing- Right, decreased arm swing- Left, decreased stride length, decreased hip/knee flexion- Right, decreased hip/knee flexion- Left, decreased trunk rotation, trunk flexed, poor foot clearance- Right, and poor foot clearance- Left Comments: Stooped posture with decreased reciprocal movement patterns and decreased stride length along with poor sequencing of cane  RAMP: Level of Assistance:  NT Assistive device utilized:  NT Ramp Comments:   CURB:  Level of Assistance:  NT Assistive device utilized:  NT Curb Comments:   STAIRS:  Level of Assistance: SBA  Stair Negotiation Technique: Alternating Pattern  with Single Rail on Right  Number of Stairs: 14   Height of Stairs: 7"  Comments: Patient is very hesitant on stairs with 2 hands on rail for most of ascent and descent  FUNCTIONAL TESTS:  5 times sit to stand: 13.03 sec with B UE assist, unable to achieve full stand w/o UE assist Timed up and go (TUG): Normal = 11.06 sec, Manual = 12.09 sec, Cognitive = 14.56 sec 10 meter walk test: 11.07 sec w/o AD, 10.88 sec with SPC; Gait speed = 2.96 ft/sec w/o AD, 3.01 ft/sec with SPC Berg Balance Scale: 43/56, 37-45 significant risk for falls (>80%) Functional gait assessment: 15/30; < 19 = high risk fall   Baseline as of 04/10/22 PT PD Screen: 5xSTS: 15.90 sec - pt notes limited by knees TUG: 10.37 sec (Normal), 12.16 sec (Manual), 12.93 sec (Cognitive) : 9.75 sec Gait speed = 3.36 ft/sec FGA = 20/30; 19-24 = medium risk fall   Baseline as of 09/26/21 PT PD Screen: 5xSTS: 12.47 sec TUG: 7.91 sec (Normal), 8.87 sec (Manual), 10.81 sec (Cognitive) :  7.69 sec  Gait speed = 4.27  ft/sec  Baseline as of D/C from last PT episode (04/06/21): 5xSTS: 12.65 sec TUG: 8.47 sec (Normal), 10.38 sec (Manual), 10.66 (Cognitive) : 6.66 sec Gait speed = 4.92 ft/sec  Berg: 53/56; 52-55 = lower risk for falls (> 25%)  FGA = 25/30; 25-28 = low risk fall   PATIENT SURVEYS:  ABC scale 1060 / 1600 = 66.3 %   TODAY'S TREATMENT:   10/16/22 THERAPEUTIC EXERCISE: to improve flexibility, strength and mobility.  Demonstration, verbal and tactile cues throughout for technique.  Rec Bike - L2 x min Seated 3-way lumbar stretch with hands on green Pball 2 x 30" each position   NEUROMUSCULAR RE-EDUCATION: To improve posture, coordination, amplitude of movement, speed of movement to reduce bradykinesia, and reduce rigidity.  Supine PWR! Up, Rock, Twist & Step x 10 Seated PWR! Up, Rock, Twist & Step x 10   10/09/22 THERAPEUTIC EXERCISE: to improve flexibility, strength and mobility.  Demonstration, verbal and tactile cues throughout for technique.  NuStep - L4 x 6 min (UE/LE to promote improved reciprocal movement patterns) Seated hip hinge HS stretch with foot on 9 inch stool x 30" bil Seated figure-4 piriformis stretch x 30" bil Seated KTOS piriformis stretch x 30" bil Seated lunge position hip flexor stretch over side of the chair x 30" bil, seat height elevated with Airex pad Standing lunge position hip flexor and gastroc stretch with UE support on counter x 30" bil - cues to keep upright torso to facilitate hip flexor stretch Seated thoracolumbar extension with pectoralis stretch over back of chair 5 x 10" Hooklying LTR 10 x 5"  NEUROMUSCULAR RE-EDUCATION: To improve posture, coordination, amplitude of movement, speed of movement to reduce bradykinesia, and reduce rigidity.  Supine PWR! Up, Rock, [place] & Step x 10   09/27/22 THERAPEUTIC ACTIVITIES: Sharlene Motts: 43/56, 37-45 significant risk for falls (>80%)   THERAPEUTIC EXERCISE: to improve flexibility, strength and mobility.   Demonstration, verbal and tactile cues throughout for technique. NuStep - L3 x 6 min (UE/LE to promote improved reciprocal movement patterns) Hooklying LTR - reviewed and demonstrated from HEP from prior PT episode but not attempted today Seated thoracolumbar extension with pectoralis stretch over back of chair 5 x 5" Seated hip hinge HS stretch with foot on 9 inch stool x 30" bil Seated figure-4 piriformis stretch x 30" bil Seated KTOS piriformis stretch x 30" bil Seated lunge position hip flexor stretch over side of the chair x 30" bil Standing lunge position hip flexor and gastroc stretch with UE support on wall x 30" bil - cues to keep upright torso to facilitate hip flexor stretch   09/25/22 Initial eval only   PATIENT EDUCATION:  Education details: PWR! Moves - Seated   Person educated: Patient Education method: Explanation, Demonstration, Verbal cues, and Handouts Education comprehension: verbalized understanding, returned demonstration, verbal cues required, and needs further education  HOME EXERCISE PROGRAM: Access Code: [email or web address] URL: [email or web address] Date: 10/09/2022 Prepared by: Glenetta Hew  Exercises - Supine Lower Trunk Rotation  - 2 x daily - 7 x weekly - 2 sets - 10 reps - 10 sec hold - Seated Thoracic Lumbar Extension with Pectoralis Stretch  - 2 x daily - 7 x weekly - 2 sets - 10 reps - 5 sec hold - Seated Hamstring Stretch  - 2 x daily - 7 x weekly - 3 reps - 30 sec hold - Seated Figure 4 Piriformis Stretch  - 2 x daily - 7  x weekly - 3 reps - 30 sec hold - Seated Piriformis Stretch  - 2 x daily - 7 x weekly - 3 reps - 30 sec hold - Seated Hip Flexor Stretch  - 2 x daily - 7 x weekly - 3 reps - 30 sec hold - Standing Gastroc Stretch at Counter  - 2 x daily - 7 x weekly - 3 reps - 30 sec hold  PWR! Moves: - Supine - Seated   ASSESSMENT:  CLINICAL IMPRESSION: Raygen reports no issues with the low back/LE stretches are going well but he has  not had the opportunity to try the supine PWR! Moves at home. Session initiated with review of PWR! Moves in supine, providing cues/clarification as needed. Progressed PWR! Moves to include instruction in seated PWR! Moves - slow pace during movements but able to complete all patterns. Some low back stiffness/discomfort reported following seated PWR! Moves, but able to resolve this with seated 3-way lumbar flexion stretch. Chas reports he continues to experience some freezing with gait and mobility, therefore will plan to review the strategies to address this next visit.  OBJECTIVE IMPAIRMENTS: Abnormal gait, decreased activity tolerance, decreased balance, decreased knowledge of condition, decreased knowledge of use of DME, decreased mobility, difficulty walking, decreased ROM, decreased strength, decreased safety awareness, increased fascial restrictions, impaired perceived functional ability, impaired flexibility, improper body mechanics, and postural dysfunction.   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   REHAB POTENTIAL: Good  CLINICAL DECISION MAKING: Evolving/moderate complexity  EVALUATION COMPLEXITY: Moderate   GOALS: Goals reviewed with patient? Yes  SHORT TERM GOALS: Target date: 10/23/2022  Patient will be independent with initial HEP. Baseline: TBD Goal status: IN PROGRESS  10/16/22 - Met for lumbopelvic stretches  2.  Patient will  demonstrate appropriate sequencing of SPC or LRAD with gait for improved balance and decrease fall risk. Baseline:  Goal status: IN PROGRESS  3.  Patient will verbalize tips to reduce freezing/festination with gait and turns. Baseline:  Goal status: IN PROGRESS  LONG TERM GOALS: Target date: 11/20/2022  Patient will be independent with ongoing/advanced HEP for self-management at home incorporating PWR! Moves as indicated .  Baseline:  Goal status: IN PROGRESS  2.  Patient will be able to ambulate 600' with or w/o LRAD with good safety to access community.  Baseline:  Goal status: IN PROGRESS  3.  Patient will be able to step up/down curb safely with or w/o LRAD for safety with community ambulation.  Baseline:  Goal status: IN PROGRESS   4.  Patient will demonstrate gait speed of >/= 3.9 ft/sec (1.2 m/s) to be a safe community ambulator including ability to safely cross the street.  Baseline: 2.96 ft/sec w/o AD, 3.01 ft/sec with SPC Goal status: IN PROGRESS  5.  Patient will improve 5x STS time to </= 16 seconds w/o need for UE assist to demonstrate improved functional strength and transfer efficiency. Baseline: 13.03 sec with B UE assist, unable to achieve full stand w/o UE assist Goal status: IN PROGRESS  6.  Patient will demonstrate at least 19/30 on FGA to  improve gait stability and reduce risk for falls. (MCID = 4 points) Baseline: 15/30 Goal status: IN PROGRESS  7.  Patient will improve Berg score by at least 8 points or to >/= 52/56 to improve safety and stability with ADLs in standing and reduce risk for falls. (MCID = 8 points)   Baseline: TBD Goal status: IN PROGRESS  8.  Patient will report >/= 75% on ABC scale to demonstrate improved balance confidence and decreased risk for falls. Baseline: 1060 / 1600 = 66.3 % Goal status: IN PROGRESS  9. Patient will verbalize understanding of local Parkinson's disease community resources, including community fitness post  d/c. Baseline:  Goal status: IN PROGRESS   PLAN:  PT FREQUENCY: 2x/week  PT DURATION: 6-8 weeks (POC for 8 weeks due to patient on vacation x 2 weeks)  PLANNED INTERVENTIONS: Therapeutic exercises, Therapeutic activity, Neuromuscular re-education, Balance training, Gait training, Patient/Family education, Self Care, Joint mobilization, Stair training, DME instructions, Aquatic Therapy, Electrical stimulation, Cryotherapy, Moist heat, Taping, Ultrasound, Manual therapy, and Re-evaluation  PLAN FOR NEXT SESSION: review tips to address freezing with gait; progress lumbopelvic flexibility and strengthening - review and update HEP PRN; progress PWR! Moves as appropriate   Marry Guan, PT 10/16/2022, 2:10 PM

## 2022-10-18 ENCOUNTER — Ambulatory Visit: Payer: Medicare Other | Admitting: Physical Therapy

## 2022-10-18 ENCOUNTER — Encounter: Payer: Self-pay | Admitting: Physical Therapy

## 2022-10-18 DIAGNOSIS — M6281 Muscle weakness (generalized): Secondary | ICD-10-CM

## 2022-10-18 DIAGNOSIS — R2689 Other abnormalities of gait and mobility: Secondary | ICD-10-CM | POA: Diagnosis not present

## 2022-10-18 DIAGNOSIS — R2681 Unsteadiness on feet: Secondary | ICD-10-CM | POA: Diagnosis not present

## 2022-10-18 DIAGNOSIS — Z9181 History of falling: Secondary | ICD-10-CM

## 2022-10-18 NOTE — Therapy (Signed)
OUTPATIENT PHYSICAL THERAPY TREATMENT   Patient Name: Vincent Walker MRN: [id number] DOB:1945/08/08, 77 y.o., male Today's Date: 10/18/2022   END OF SESSION:  PT End of Session - 10/18/22 1359     Visit Number 5    Date for PT Re-Evaluation 11/20/22    Authorization Type Medicare & Federal BCBS    PT Start Time 1359    PT Stop Time 1442    PT Time Calculation (min) 43 min    Activity Tolerance Patient tolerated treatment well    Behavior During Therapy [facility] for tasks assessed/performed;Flat affect               Past Medical History:  Diagnosis Date   Adenomatous polyp of ascending colon 10/09/2018   Allergies 07/07/2018   Arthritis    Benign essential hypertension 02/08/2015   Chronic renal insufficiency 02/14/2015   Constipation 12/23/2015   Dyslipidemia 02/08/2015   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 Required nasal packing a couple of days ago.  Had a similar episode of epistaxis a little over a year ago.  At that visit, I could not see the exact bleeding spot. EXAMINATION after packing removal reveals several excoriated areas anteriorly but I was unable to see t   Glaucoma    History of blood transfusion 2010   After Kidney surgery   History of chicken pox    History of renal cell carcinoma 02/08/2015   diagnosed and removed in 2010 Right Monitored by Dr Gaynelle Arabian of urology at [facility] Dr Karin Lieu, nephrology at [facility]   Hyperglycemia 12/23/2015   Hyperlipidemia    Increased thyroid stimulating hormone (TSH) level 06/07/2017   Ingrown left big toenail 02/14/2015   Left foot pain 02/14/2015   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   MVP (mitral valve prolapse) 05/14/2016   Parkinson's disease 12/23/2015   Right hip pain 12/12/2016   Thrombocytopenia 02/08/2015   Tremor of right hand 02/14/2015   Past Surgical  History:  Procedure Laterality Date   APPENDECTOMY  1995   CATARACT EXTRACTION, BILATERAL     COLONOSCOPY     COLONOSCOPY WITH PROPOFOL N/A 12/17/2018   Procedure: COLONOSCOPY WITH PROPOFOL;  Surgeon: Lemar Lofty., MD;  Location: [facility] ENDOSCOPY;  Service: Gastroenterology;  Laterality: N/A;   ENDOSCOPIC MUCOSAL RESECTION N/A 12/17/2018   Procedure: ENDOSCOPIC MUCOSAL RESECTION;  Surgeon: Meridee Score Netty Starring., MD;  Location: [facility] ENDOSCOPY;  Service: Gastroenterology;  Laterality: N/A;   HEMOSTASIS CLIP PLACEMENT  12/17/2018   Procedure: HEMOSTASIS CLIP PLACEMENT;  Surgeon: Lemar Lofty., MD;  Location: [facility] ENDOSCOPY;  Service: Gastroenterology;;   left knee scope  2003   POLYPECTOMY  12/17/2018   Procedure: POLYPECTOMY;  Surgeon: Meridee Score Netty Starring., MD;  Location: [facility] ENDOSCOPY;  Service: Gastroenterology;;   right knee scope  1999   SUBMUCOSAL LIFTING INJECTION  12/17/2018   Procedure: SUBMUCOSAL LIFTING INJECTION;  Surgeon: Lemar Lofty., MD;  Location: [facility] ENDOSCOPY;  Service: Gastroenterology;;   TOE SURGERY Left    metal 2nd toe- and top of foot- straighten bone   TONSILLECTOMY     TOTAL KNEE ARTHROPLASTY Left 07/06/2014   TOTAL NEPHRECTOMY Right    Patient Active Problem List   Diagnosis Date Noted   Flank pain 07/19/2022   Eustachian tube dysfunction, left 02/21/2021   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   Early stage nonexudative age-related macular degeneration  of both eyes 03/09/2019   Adenomatous polyp of ascending colon 10/09/2018   Epistaxis 07/07/2018   Allergies 07/07/2018   Obesity 06/15/2018   Increased thyroid stimulating hormone (TSH) level 06/07/2017   Right hip pain 12/12/2016   MVP (mitral valve prolapse) 05/14/2016   Parkinson's disease 12/23/2015   Constipation 12/23/2015   Hyperglycemia 12/23/2015   Tremor of right hand 02/14/2015   Left foot pain 02/14/2015   Chronic renal insufficiency  02/14/2015   Benign essential hypertension 02/08/2015   Dyslipidemia 02/08/2015   History of renal cell carcinoma 02/08/2015   Thrombocytopenia 02/08/2015   History of chicken pox     PCP: Bradd Canary, MD   REFERRING PROVIDER: Vladimir Faster, DO   REFERRING DIAG: G20.A1 (ICD-10-CM) - Parkinson's disease without dyskinesia or fluctuating manifestations   THERAPY DIAG:  Other abnormalities of gait and mobility  Unsteadiness on feet  Muscle weakness (generalized)  History of falling  RATIONALE FOR EVALUATION AND TREATMENT: Rehabilitation  ONSET DATE: PD diagnosis 4+ years ago   NEXT MD VISIT: 02/25/23   SUBJECTIVE:                                                                                                                                                                                                         SUBJECTIVE STATEMENT: Pt reports his knees feel tight and swollen today but do not appear swollen. His R hip has been bothering him more today, causing him to have to rely more on his RW.  Pt accompanied by: significant other - wife [facility]) in waiting room  PAIN: Are you having pain? Yes: NPRS scale: 2-3/10 currently, earlier today up to 8-9/10 Pain location: R hip Pain description: "crippling" Aggravating factors: possibly sleeping on R side Relieving factors: limiting walking  PERTINENT HISTORY:  Parkinson's disease, mild neurocognitive disorder due to PD, HTN, MVP, L TKA, R knee scope, CRI, h/o renal cancer with R nephrectomy, thrombocytopenia, dyslipidemia, B macular degeneration    PRECAUTIONS: Fall  WEIGHT BEARING RESTRICTIONS: No  FALLS:  Has patient fallen in last 6 months? Yes. Number of falls 1  LIVING ENVIRONMENT: Lives with: lives with their spouse Lives in: House/apartment Stairs: No Has following equipment at home: Single point cane, Environmental consultant - 2 wheeled, Grab bars, and Recumbent Bike  OCCUPATION: Retired  PLOF: Independent and Leisure:  mostly sedentary recently (previously he had been completing HEP exercises from prior PT episode + some other exercises, bike for 20-30 min/day, work with [employer])     PATIENT GOALS: "To get going again."   OBJECTIVE: (  objective measures completed at initial evaluation unless otherwise dated)  DIAGNOSTIC FINDINGS:  07/19/22 - R hip and pelvis x-ray: FINDINGS: Mildly decreased bone mineralization. Mild bilateral sacroiliac subchondral sclerosis. The pubic symphysis joint space is maintained.   Mild bilateral superior femoroacetabular joint space narrowing. Mild-to-moderate right and mild left superolateral acetabular degenerative osteophytes. Normal morphology of the right femoral head-neck junction without CAM-type bump deformity. No acute fracture or dislocation.   Mild atherosclerotic calcifications. Multiple vascular phleboliths again overlie the pelvis.   IMPRESSION: 1. No acute fracture. 2. 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  COGNITION: Overall cognitive status: Impaired - decreased short-term memory and recall of new information   SENSATION: WFL  COORDINATION: B heel-toe mildly diminished. Impaired heel to shin bilaterally.  EDEMA:  N/A  MUSCLE TONE: Increased LE muscle tone noted during attempts at PROM/flexibility assessment.  MUSCLE LENGTH: Hamstrings: mod tight B ITB: mild tight B Piriformis: mod tight B Hip flexors: mod tight B Quads: mild tight B Heelcord: mod tight R, mild tight L  POSTURE:  rounded shoulders, forward head, decreased lumbar lordosis, increased thoracic kyphosis, flexed trunk , and weight shift right  LOWER EXTREMITY ROM:    Mildly limited B hip and ankle ROM (R>L), mostly due to limited muscle flexibility as above.  LOWER EXTREMITY MMT:    MMT Right Eval Left Eval  Hip flexion 4+ 4+  Hip extension 4 4+  Hip abduction 4 4+  Hip adduction 4+ 4+  Hip internal rotation 5 5  Hip external rotation 4+  4+  Knee flexion 4+ 4+  Knee extension 4+ 4+  Ankle dorsiflexion 4+ 4+  Ankle plantarflexion 4 4  Ankle inversion    Ankle eversion    (Blank rows = not tested)  BED MOBILITY:  Supine to/from sit and rolling with SBA/CGA as well as min assist to lift leg onto treatment table  TRANSFERS: Assistive device utilized: Single point cane and None  Sit to stand: Modified independence Stand to sit: Modified independence Chair to chair: Modified independence Floor:  NT  GAIT: Distance walked: 120 ft Assistive device utilized: Single point cane and None Level of assistance: SBA Gait pattern: decreased arm swing- Right, decreased arm swing- Left, decreased stride length, decreased hip/knee flexion- Right, decreased hip/knee flexion- Left, decreased trunk rotation, trunk flexed, poor foot clearance- Right, and poor foot clearance- Left Comments: Stooped posture with decreased reciprocal movement patterns and decreased stride length along with poor sequencing of cane  RAMP: Level of Assistance:  NT Assistive device utilized:  NT Ramp Comments:   CURB:  Level of Assistance:  NT Assistive device utilized:  NT Curb Comments:   STAIRS:  Level of Assistance: SBA  Stair Negotiation Technique: Alternating Pattern  with Single Rail on Right  Number of Stairs: 14   Height of Stairs: 7"  Comments: Patient is very hesitant on stairs with 2 hands on rail for most of ascent and descent  FUNCTIONAL TESTS:  5 times sit to stand: 13.03 sec with B UE assist, unable to achieve full stand w/o UE assist Timed up and go (TUG): Normal = 11.06 sec, Manual = 12.09 sec, Cognitive = 14.56 sec 10 meter walk test: 11.07 sec w/o AD, 10.88 sec with SPC; Gait speed = 2.96 ft/sec w/o AD, 3.01 ft/sec with SPC Berg Balance Scale: 43/56, 37-45 significant risk for falls (>80%) Functional gait assessment: 15/30; < 19 = high risk fall   Baseline as of 04/10/22 PT PD Screen: 5xSTS: 15.90 sec - pt  notes limited by  knees TUG: 10.37 sec (Normal), 12.16 sec (Manual), 12.93 sec (Cognitive) : 9.75 sec Gait speed = 3.36 ft/sec FGA = 20/30; 19-24 = medium risk fall   Baseline as of 09/26/21 PT PD Screen: 5xSTS: 12.47 sec TUG: 7.91 sec (Normal), 8.87 sec (Manual), 10.81 sec (Cognitive) : 7.69 sec  Gait speed = 4.27 ft/sec  Baseline as of D/C from last PT episode (04/06/21): 5xSTS: 12.65 sec TUG: 8.47 sec (Normal), 10.38 sec (Manual), 10.66 (Cognitive) : 6.66 sec Gait speed = 4.92 ft/sec  Berg: 53/56; 52-55 = lower risk for falls (> 25%)  FGA = 25/30; 25-28 = low risk fall   PATIENT SURVEYS:  ABC scale 1060 / 1600 = 66.3 %   TODAY'S TREATMENT:   10/18/22 THERAPEUTIC EXERCISE: to improve flexibility, strength and mobility.  Demonstration, verbal and tactile cues throughout for technique.  NuStep - L4 x 6 min (UE/LE to promote improved reciprocal movement patterns)  SELF CARE/GAIT:  Provided education on tips to prevent freezing with transfers and gait - practiced these while walking through clinic and approaching a chair   NEUROMUSCULAR RE-EDUCATION: To improve posture, balance, proprioception, coordination, reduce fall risk, amplitude of movement, speed of movement to reduce bradykinesia, and reduce rigidity. Seated PWR! Up, Rock, Twist & Step x 10 Standing PWR! Up, Rock, Twist & Step x 10   10/16/22 THERAPEUTIC EXERCISE: to improve flexibility, strength and mobility.  Demonstration, verbal and tactile cues throughout for technique.  Rec Bike - L2 x 6 min Seated 3-way lumbar stretch with hands on green Pball 2 x 30" each position   NEUROMUSCULAR RE-EDUCATION: To improve posture, coordination, amplitude of movement, speed of movement to reduce bradykinesia, and reduce rigidity.  Supine PWR! Up, Rock, Twist & Step x 10 Seated PWR! Up, Rock, Twist & Step x 10   10/09/22 THERAPEUTIC EXERCISE: to improve flexibility, strength and mobility.  Demonstration, verbal and tactile cues  throughout for technique.  NuStep - L4 x 6 min (UE/LE to promote improved reciprocal movement patterns) Seated hip hinge HS stretch with foot on 9 inch stool x 30" bil Seated figure-4 piriformis stretch x 30" bil Seated KTOS piriformis stretch x 30" bil Seated lunge position hip flexor stretch over side of the chair x 30" bil, seat height elevated with Airex pad Standing lunge position hip flexor and gastroc stretch with UE support on counter x 30" bil - cues to keep upright torso to facilitate hip flexor stretch Seated thoracolumbar extension with pectoralis stretch over back of chair 5 x 10" Hooklying LTR 10 x 5"  NEUROMUSCULAR RE-EDUCATION: To improve posture, coordination, amplitude of movement, speed of movement to reduce bradykinesia, and reduce rigidity.  Supine PWR! Up, Rock, Twist & Step x 10   PATIENT EDUCATION:  Education details: PWR! Moves - Standing, tips to reduce freezing with transfers and gait, and frequency of performance of PWR! Moves alternating different patterns on different days   Person educated: Patient Education method: Explanation, Demonstration, Verbal cues, and Handouts Education comprehension: verbalized understanding, returned demonstration, verbal cues required, and needs further education  HOME EXERCISE PROGRAM: Access Code: [email or web address] URL: [email or web address] Date: 10/18/2022 Prepared by: Glenetta Hew  Exercises - Supine Lower Trunk Rotation  - 2 x daily - 7 x weekly - 2 sets - 10 reps - 10 sec hold - Seated Thoracic Lumbar Extension with Pectoralis Stretch  - 2 x daily - 7 x weekly - 2 sets - 10 reps - 5 sec hold - Seated Hamstring Stretch  -  2 x daily - 7 x weekly - 3 reps - 30 sec hold - Seated Figure 4 Piriformis Stretch  - 2 x daily - 7 x weekly - 3 reps - 30 sec hold - Seated Piriformis Stretch  - 2 x daily - 7 x weekly - 3 reps - 30 sec hold - Seated Hip Flexor Stretch  - 2 x daily - 7 x weekly - 3 reps - 30 sec hold - Standing  Gastroc Stretch at Counter  - 2 x daily - 7 x weekly - 3 reps - 30 sec hold  Patient Education - Tips to reduce freezing episodes with standing or walking  PWR! Moves: - Supine - Seated   ASSESSMENT:  CLINICAL IMPRESSION: Shadd reports he continues to experience some freezing with gait and mobility which he finds very embarrassing, therefore provided education and review of strategies to minimize/reduce freezing episodes with standing and walking.  Incorporated the strategies into walking around the clinic with cues necessary to remove to maintain upright gaze as well as utilize increased hip and knee flexion/high step to improve foot clearance.  Reviewed seated PWR! Moves, clarifying which version of PWR! Rock he should be trying at home and reinforcing need for bilateral LE motion during PWR! Step.  Proceeded with progression of the PWR! Moves to standing, with frequent cues necessary to bring/keep head upright during all movement patterns and have gaze follow hand movements during PWR! Rock, Twist and Step.  Patient will have 2-week gap in therapy due to traveling on vacation as well as therapist taking time off, but will for any concerns experienced during time off and continue to progress therapeutic activities and PWR! Moves upon his return.  OBJECTIVE IMPAIRMENTS: Abnormal gait, decreased activity tolerance, decreased balance, decreased knowledge of condition, decreased knowledge of use of DME, decreased mobility, difficulty walking, decreased ROM, decreased strength, decreased safety awareness, increased fascial restrictions, impaired perceived functional ability, impaired flexibility, improper body mechanics, and postural dysfunction.   ACTIVITY LIMITATIONS: bending, standing, squatting, stairs, transfers, bed mobility, locomotion level, and caring for others  PARTICIPATION LIMITATIONS: driving, shopping, community activity, and yard work  PERSONAL FACTORS: Age, Behavior pattern,  Fitness, Past/current experiences, Time since onset of injury/illness/exacerbation, and 3+ comorbidities: Parkinson's disease, mild neurocognitive disorder due to PD, HTN, MVP, L TKA, R knee scope, CRI, h/o renal cancer with R nephrectomy, thrombocytopenia, dyslipidemia, B macular degeneration  are also affecting patient's functional outcome.   REHAB POTENTIAL: Good  CLINICAL DECISION MAKING: Evolving/moderate complexity  EVALUATION COMPLEXITY: Moderate   GOALS: Goals reviewed with patient? Yes  SHORT TERM GOALS: Target date: 10/23/2022  Patient will be independent with initial HEP. Baseline: TBD Goal status: MET  10/18/22   2.  Patient will demonstrate appropriate sequencing of SPC or LRAD with gait for improved balance and decrease fall risk. Baseline:  Goal status: IN PROGRESS  10/18/22 - not yet attempted as majority of gait during therapy sessions have been completed without AD  3.  Patient will verbalize tips to reduce freezing/festination with gait and turns. Baseline:  Goal status: MET  10/18/22  LONG TERM GOALS: Target date: 11/20/2022  Patient will be independent with ongoing/advanced HEP for self-management at home incorporating PWR! Moves as indicated .  Baseline:  Goal status: IN PROGRESS  2.  Patient will be able to ambulate 600' with or w/o LRAD with good safety to access community.  Baseline:  Goal status: IN PROGRESS  3.  Patient will be able to step up/down curb safely  with or w/o LRAD for safety with community ambulation.  Baseline:  Goal status: IN PROGRESS   4.  Patient will demonstrate gait speed of >/= 3.9 ft/sec (1.2 m/s) to be a safe community ambulator including ability to safely cross the street.  Baseline: 2.96 ft/sec w/o AD, 3.01 ft/sec with SPC Goal status: IN PROGRESS  5.  Patient will improve 5x STS time to </= 16 seconds w/o need for UE assist to demonstrate improved functional strength and transfer efficiency. Baseline: 13.03 sec with B UE  assist, unable to achieve full stand w/o UE assist Goal status: IN PROGRESS  6.  Patient will demonstrate at least 19/30 on FGA to improve gait stability and reduce risk for falls. (MCID = 4 points) Baseline: 15/30 Goal status: IN PROGRESS  7.  Patient will improve Berg score by at least 8 points or to >/= 52/56 to improve safety and stability with ADLs in standing and reduce risk for falls. (MCID = 8 points)   Baseline: TBD Goal status: IN PROGRESS  8.  Patient will report >/= 75% on ABC scale to demonstrate improved balance confidence and decreased risk for falls. Baseline: 1060 / 1600 = 66.3 % Goal status: IN PROGRESS  9. Patient will verbalize understanding of local Parkinson's disease community resources, including community fitness post d/c. Baseline:  Goal status: IN PROGRESS   PLAN:  PT FREQUENCY: 2x/week  PT DURATION: 6-8 weeks (POC for 8 weeks due to patient on vacation x 2 weeks)  PLANNED INTERVENTIONS: Therapeutic exercises, Therapeutic activity, Neuromuscular re-education, Balance training, Gait training, Patient/Family education, Self Care, Joint mobilization, Stair training, DME instructions, Aquatic Therapy, Electrical stimulation, Cryotherapy, Moist heat, Taping, Ultrasound, Manual therapy, and Re-evaluation  PLAN FOR NEXT SESSION:  progress lumbopelvic flexibility and strengthening - review and update HEP PRN; progress PWR! Moves as appropriate; review tips to address freezing with gait PRN   Marry Guan, PT 10/18/2022, 5:53 PM

## 2022-10-19 ENCOUNTER — Other Ambulatory Visit (HOSPITAL_BASED_OUTPATIENT_CLINIC_OR_DEPARTMENT_OTHER): Payer: Self-pay

## 2022-10-19 ENCOUNTER — Other Ambulatory Visit: Payer: Self-pay | Admitting: Medical

## 2022-10-19 MED ORDER — ATENOLOL 25 MG PO TABS
25.0000 mg | ORAL_TABLET | Freq: Every evening | ORAL | 3 refills | Status: DC
  Filled 2022-10-19: qty 90, 90d supply, fill #0
  Filled 2023-01-14: qty 90, 90d supply, fill #1
  Filled 2023-04-15: qty 90, 90d supply, fill #2
  Filled 2023-07-12: qty 90, 90d supply, fill #3

## 2022-10-23 ENCOUNTER — Encounter: Payer: Medicare Other | Admitting: Physical Therapy

## 2022-10-25 ENCOUNTER — Other Ambulatory Visit (HOSPITAL_BASED_OUTPATIENT_CLINIC_OR_DEPARTMENT_OTHER): Payer: Self-pay

## 2022-10-25 ENCOUNTER — Other Ambulatory Visit: Payer: Self-pay | Admitting: Neurology

## 2022-10-25 ENCOUNTER — Encounter: Payer: Medicare Other | Admitting: Physical Therapy

## 2022-10-25 MED ORDER — DIVALPROEX SODIUM ER 250 MG PO TB24
250.0000 mg | ORAL_TABLET | Freq: Every day | ORAL | 1 refills | Status: DC
  Filled 2022-10-25: qty 90, 90d supply, fill #0
  Filled 2023-01-21: qty 90, 90d supply, fill #1

## 2022-10-26 ENCOUNTER — Other Ambulatory Visit: Payer: Self-pay

## 2022-11-06 ENCOUNTER — Encounter: Payer: Self-pay | Admitting: Physical Therapy

## 2022-11-06 ENCOUNTER — Ambulatory Visit: Payer: Medicare Other | Attending: Neurology | Admitting: Physical Therapy

## 2022-11-06 DIAGNOSIS — R2681 Unsteadiness on feet: Secondary | ICD-10-CM | POA: Insufficient documentation

## 2022-11-06 DIAGNOSIS — M6281 Muscle weakness (generalized): Secondary | ICD-10-CM | POA: Diagnosis not present

## 2022-11-06 DIAGNOSIS — Z9181 History of falling: Secondary | ICD-10-CM | POA: Insufficient documentation

## 2022-11-06 DIAGNOSIS — R2689 Other abnormalities of gait and mobility: Secondary | ICD-10-CM | POA: Diagnosis not present

## 2022-11-06 NOTE — Therapy (Signed)
OUTPATIENT PHYSICAL THERAPY TREATMENT   Patient Name: Vincent Walker MRN: [id number] DOB:1945/05/14, 77 y.o., male Today's Date: 11/06/2022   END OF SESSION:  PT End of Session - 11/06/22 1317     Visit Number 6    Date for PT Re-Evaluation 11/20/22    Authorization Type Medicare & Federal BCBS    PT Start Time 1317    PT Stop Time 1401    PT Time Calculation (min) 44 min    Activity Tolerance Patient tolerated treatment well    Behavior During Therapy [facility] for tasks assessed/performed;Flat affect               Past Medical History:  Diagnosis Date   Adenomatous polyp of ascending colon 10/09/2018   Allergies 07/07/2018   Arthritis    Benign essential hypertension 02/08/2015   Chronic renal insufficiency 02/14/2015   Constipation 12/23/2015   Dyslipidemia 02/08/2015   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 Required nasal packing a couple of days ago.  Had a similar episode of epistaxis a little over a year ago.  At that visit, I could not see the exact bleeding spot. EXAMINATION after packing removal reveals several excoriated areas anteriorly but I was unable to see t   Glaucoma    History of blood transfusion 2010   After Kidney surgery   History of chicken pox    History of renal cell carcinoma 02/08/2015   diagnosed and removed in 2010 Right Monitored by Dr Gaynelle Arabian of urology at [facility] Karin Lieu, nephrology at [facility]   Hyperglycemia 12/23/2015   Hyperlipidemia    Increased thyroid stimulating hormone (TSH) level 06/07/2017   Ingrown left big toenail 02/14/2015   Left foot pain 02/14/2015   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   MVP (mitral valve prolapse) 05/14/2016   Parkinson's disease 12/23/2015   Right hip pain 12/12/2016   Thrombocytopenia 02/08/2015   Tremor of right hand 02/14/2015   Past Surgical  History:  Procedure Laterality Date   APPENDECTOMY  1995   CATARACT EXTRACTION, BILATERAL     COLONOSCOPY     COLONOSCOPY WITH PROPOFOL N/A 12/17/2018   Procedure: COLONOSCOPY WITH PROPOFOL;  Surgeon: Lemar Lofty., MD;  Location: [facility] ENDOSCOPY;  Service: Gastroenterology;  Laterality: N/A;   ENDOSCOPIC MUCOSAL RESECTION N/A 12/17/2018   Procedure: ENDOSCOPIC MUCOSAL RESECTION;  Surgeon: Meridee Score Netty Starring., MD;  Location: [facility] ENDOSCOPY;  Service: Gastroenterology;  Laterality: N/A;   HEMOSTASIS CLIP PLACEMENT  12/17/2018   Procedure: HEMOSTASIS CLIP PLACEMENT;  Surgeon: Lemar Lofty., MD;  Location: [facility] ENDOSCOPY;  Service: Gastroenterology;;   left knee scope  2003   POLYPECTOMY  12/17/2018   Procedure: POLYPECTOMY;  Surgeon: Meridee Score Netty Starring., MD;  Location: [facility] ENDOSCOPY;  Service: Gastroenterology;;   right knee scope  1999   SUBMUCOSAL LIFTING INJECTION  12/17/2018   Procedure: SUBMUCOSAL LIFTING INJECTION;  Surgeon: Lemar Lofty., MD;  Location: [facility] ENDOSCOPY;  Service: Gastroenterology;;   TOE SURGERY Left    metal 2nd toe- and top of foot- straighten bone   TONSILLECTOMY     TOTAL KNEE ARTHROPLASTY Left 07/06/2014   TOTAL NEPHRECTOMY Right    Patient Active Problem List   Diagnosis Date Noted   Flank pain 07/19/2022   Eustachian tube dysfunction, left 02/21/2021   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   Early stage nonexudative age-related macular degeneration  of both eyes 03/09/2019   Adenomatous polyp of ascending colon 10/09/2018   Epistaxis 07/07/2018   Allergies 07/07/2018   Obesity 06/15/2018   Increased thyroid stimulating hormone (TSH) level 06/07/2017   Right hip pain 12/12/2016   MVP (mitral valve prolapse) 05/14/2016   Parkinson's disease 12/23/2015   Constipation 12/23/2015   Hyperglycemia 12/23/2015   Tremor of right hand 02/14/2015   Left foot pain 02/14/2015   Chronic renal insufficiency  02/14/2015   Benign essential hypertension 02/08/2015   Dyslipidemia 02/08/2015   History of renal cell carcinoma 02/08/2015   Thrombocytopenia 02/08/2015   History of chicken pox     PCP: Bradd Canary, MD   REFERRING PROVIDER: Vladimir Faster, DO   REFERRING DIAG: G20.A1 (ICD-10-CM) - Parkinson's disease without dyskinesia or fluctuating manifestations   THERAPY DIAG:  Other abnormalities of gait and mobility  Unsteadiness on feet  Muscle weakness (generalized)  History of falling  RATIONALE FOR EVALUATION AND TREATMENT: Rehabilitation  ONSET DATE: PD diagnosis 4+ years ago   NEXT MD VISIT: 02/25/23   SUBJECTIVE:                                                                                                                                                                                                         SUBJECTIVE STATEMENT: Pt continues to report his knees feel tight and swollen, but do not appear swollen.  He also notes mild LBP  PAIN: Are you having pain? Yes: NPRS scale: 4/10 Pain location: back Pain description: dull ache Aggravating factors: bending over for long periods Relieving factors: heat  PAIN:  Are you having pain? Yes: NPRS scale: 7-8/10 Pain location: B knees Pain description: tight/swollen Aggravating factors: unpredictable Relieving factors: "bear through it'   PERTINENT HISTORY:  Parkinson's disease, mild neurocognitive disorder due to PD, HTN, MVP, L TKA, R knee scope, CRI, h/o renal cancer with R nephrectomy, thrombocytopenia, dyslipidemia, B macular degeneration    PRECAUTIONS: Fall  WEIGHT BEARING RESTRICTIONS: No  FALLS:  Has patient fallen in last 6 months? Yes. Number of falls 1  LIVING ENVIRONMENT: Lives with: lives with their spouse Lives in: House/apartment Stairs: No Has following equipment at home: Single point cane, Environmental consultant - 2 wheeled, Grab bars, and Recumbent Bike  OCCUPATION: Retired  PLOF: Independent and  Leisure: mostly sedentary recently (previously he had been completing HEP exercises from prior PT episode + some other exercises, bike for 20-30 min/day, work with [employer])     PATIENT GOALS: "To get going again."   OBJECTIVE: (objective measures completed at  initial evaluation unless otherwise dated)  DIAGNOSTIC FINDINGS:  07/19/22 - R hip and pelvis x-ray: FINDINGS: Mildly decreased bone mineralization. Mild bilateral sacroiliac subchondral sclerosis. The pubic symphysis joint space is maintained.   Mild bilateral superior femoroacetabular joint space narrowing. Mild-to-moderate right and mild left superolateral acetabular degenerative osteophytes. Normal morphology of the right femoral head-neck junction without CAM-type bump deformity. No acute fracture or dislocation.   Mild atherosclerotic calcifications. Multiple vascular phleboliths again overlie the pelvis.   IMPRESSION: 1. No acute fracture. 2. 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  COGNITION: Overall cognitive status: Impaired - decreased short-term memory and recall of new information   SENSATION: WFL  COORDINATION: B heel-toe mildly diminished. Impaired heel to shin bilaterally.  EDEMA:  N/A  MUSCLE TONE: Increased LE muscle tone noted during attempts at PROM/flexibility assessment.  MUSCLE LENGTH: Hamstrings: mod tight B ITB: mild tight B Piriformis: mod tight B Hip flexors: mod tight B Quads: mild tight B Heelcord: mod tight R, mild tight L  POSTURE:  rounded shoulders, forward head, decreased lumbar lordosis, increased thoracic kyphosis, flexed trunk , and weight shift right  LOWER EXTREMITY ROM:    Mildly limited B hip and ankle ROM (R>L), mostly due to limited muscle flexibility as above.  LOWER EXTREMITY MMT:    MMT Right Eval Left Eval  Hip flexion 4+ 4+  Hip extension 4 4+  Hip abduction 4 4+  Hip adduction 4+ 4+  Hip internal rotation 5 5  Hip external  rotation 4+ 4+  Knee flexion 4+ 4+  Knee extension 4+ 4+  Ankle dorsiflexion 4+ 4+  Ankle plantarflexion 4 4  Ankle inversion    Ankle eversion    (Blank rows = not tested)  BED MOBILITY:  Supine to/from sit and rolling with SBA/CGA as well as min assist to lift leg onto treatment table  TRANSFERS: Assistive device utilized: Single point cane and None  Sit to stand: Modified independence Stand to sit: Modified independence Chair to chair: Modified independence Floor:  NT  GAIT: Distance walked: 120 ft Assistive device utilized: Single point cane and None Level of assistance: SBA Gait pattern: decreased arm swing- Right, decreased arm swing- Left, decreased stride length, decreased hip/knee flexion- Right, decreased hip/knee flexion- Left, decreased trunk rotation, trunk flexed, poor foot clearance- Right, and poor foot clearance- Left Comments: Stooped posture with decreased reciprocal movement patterns and decreased stride length along with poor sequencing of cane  RAMP: Level of Assistance:  NT Assistive device utilized:  NT Ramp Comments:   CURB:  Level of Assistance:  NT Assistive device utilized:  NT Curb Comments:   STAIRS:  Level of Assistance: SBA  Stair Negotiation Technique: Alternating Pattern  with Single Rail on Right  Number of Stairs: 14   Height of Stairs: 7"  Comments: Patient is very hesitant on stairs with 2 hands on rail for most of ascent and descent  FUNCTIONAL TESTS:  5 times sit to stand: 13.03 sec with B UE assist, unable to achieve full stand w/o UE assist Timed up and go (TUG): Normal = 11.06 sec, Manual = 12.09 sec, Cognitive = 14.56 sec 10 meter walk test: 11.07 sec w/o AD, 10.88 sec with SPC; Gait speed = 2.96 ft/sec w/o AD, 3.01 ft/sec with SPC Berg Balance Scale: 43/56, 37-45 significant risk for falls (>80%) Functional gait assessment: 15/30; < 19 = high risk fall   Baseline as of 04/10/22 PT PD Screen: 5xSTS: 15.90 sec - pt notes  limited by  knees TUG: 10.37 sec (Normal), 12.16 sec (Manual), 12.93 sec (Cognitive) : 9.75 sec Gait speed = 3.36 ft/sec FGA = 20/30; 19-24 = medium risk fall   Baseline as of 09/26/21 PT PD Screen: 5xSTS: 12.47 sec TUG: 7.91 sec (Normal), 8.87 sec (Manual), 10.81 sec (Cognitive) : 7.69 sec  Gait speed = 4.27 ft/sec  Baseline as of D/C from last PT episode (04/06/21): 5xSTS: 12.65 sec TUG: 8.47 sec (Normal), 10.38 sec (Manual), 10.66 (Cognitive) : 6.66 sec Gait speed = 4.92 ft/sec  Berg: 53/56; 52-55 = lower risk for falls (> 25%)  FGA = 25/30; 25-28 = low risk fall   PATIENT SURVEYS:  ABC scale 1060 / 1600 = 66.3 %   TODAY'S TREATMENT:   11/06/22 THERAPEUTIC EXERCISE: to improve flexibility, strength and mobility.  Demonstration, verbal and tactile cues throughout for technique.  NuStep - L5 x 6 min (UE/LE to promote improved reciprocal movement patterns) Seated R/L QL stretches x 30" each Seated 3-way lumbar flexion stretch with UE support on SPC x 30" each SLS + RTB 4-way SLR x 10 each, UE support on back of chair  NEUROMUSCULAR RE-EDUCATION: To improve posture, balance, proprioception, coordination, reduce fall risk, amplitude of movement, speed of movement to reduce bradykinesia, and reduce rigidity. Seated PWR! Up, Rock, Twist & Step x 10 Standing PWR! Up, Rock, Twist & Step 2 x 10    10/18/22 THERAPEUTIC EXERCISE: to improve flexibility, strength and mobility.  Demonstration, verbal and tactile cues throughout for technique.  NuStep - L4 x 6 min (UE/LE to promote improved reciprocal movement patterns)  SELF CARE/GAIT:  Provided education on tips to prevent freezing with transfers and gait - practiced these while walking through clinic and approaching a chair   NEUROMUSCULAR RE-EDUCATION: To improve posture, balance, proprioception, coordination, reduce fall risk, amplitude of movement, speed of movement to reduce bradykinesia, and reduce rigidity. Seated  PWR! Up, Rock, Twist & Step x 10 Standing PWR! Up, Rock, Twist & Step x 10   10/16/22 THERAPEUTIC EXERCISE: to improve flexibility, strength and mobility.  Demonstration, verbal and tactile cues throughout for technique.  Rec Bike - L2 x 6 min Seated 3-way lumbar stretch with hands on green Pball 2 x 30" each position   NEUROMUSCULAR RE-EDUCATION: To improve posture, coordination, amplitude of movement, speed of movement to reduce bradykinesia, and reduce rigidity.  Supine PWR! Up, Rock, Twist & Step x 10 Seated PWR! Up, Rock, Twist & Step x 10   PATIENT EDUCATION:  Education details: PWR! Moves - Standing, tips to reduce freezing with transfers and gait, and frequency of performance of PWR! Moves alternating different patterns on different days   Person educated: Patient Education method: Explanation, Demonstration, Verbal cues, and Handouts Education comprehension: verbalized understanding, returned demonstration, verbal cues required, and needs further education  HOME EXERCISE PROGRAM: Access Code: [email or web address] URL: [email or web address] Date: 11/06/2022 Prepared by: Glenetta Hew  Exercises - Supine Lower Trunk Rotation  - 2 x daily - 7 x weekly - 2 sets - 10 reps - 10 sec hold - Seated Thoracic Lumbar Extension with Pectoralis Stretch  - 2 x daily - 7 x weekly - 2 sets - 10 reps - 5 sec hold - Seated Hamstring Stretch  - 2 x daily - 7 x weekly - 3 reps - 30 sec hold - Seated Figure 4 Piriformis Stretch  - 2 x daily - 7 x weekly - 3 reps - 30 sec hold - Seated Piriformis Stretch  - 2 x daily -  7 x weekly - 3 reps - 30 sec hold - Seated Hip Flexor Stretch  - 2 x daily - 7 x weekly - 3 reps - 30 sec hold - Standing Gastroc Stretch at Counter  - 2 x daily - 7 x weekly - 3 reps - 30 sec hold - Seated Quadratus Lumborum Stretch in Chair  - 2 x daily - 7 x weekly - 3 reps - 30 sec hold - Seated Flexion Stretch with Swiss Ball  - 2 x daily - 7 x weekly - 3 reps - 30 sec  hold - Seated Thoracic Flexion and Rotation with Swiss Ball  - 2 x daily - 7 x weekly - 3 reps - 30 sec hold  Patient Education - Tips to reduce freezing episodes with standing or walking  PWR! Moves: - Supine - Seated - Standing   ASSESSMENT:  CLINICAL IMPRESSION: Nikalas reports some difficulty with seated PWR! Moves, in particular PWR! Rock, due to lateral low back/QL pain.  Provided instructions in seated QL and lumbar flexion/lateral flexion stretches with improved tolerance noted for PWR! Moves following these.  Reviewed seated and standing PWR! Moves with patient feeling more confident with standing moves today.  Session concluded with hip strengthening to promote improved knee stability for decreased pain, but patient requiring close guarding and frequent cues for proper performance therefore further follow-up indicated.  Pratyush will benefit from continued skilled PT to address ongoing pain, flexibility, range of motion, strength and balance deficits to improve mobility and activity tolerance with decreased pain interference and decrease risk for falls.   OBJECTIVE IMPAIRMENTS: Abnormal gait, decreased activity tolerance, decreased balance, decreased knowledge of condition, decreased knowledge of use of DME, decreased mobility, difficulty walking, decreased ROM, decreased strength, decreased safety awareness, increased fascial restrictions, impaired perceived functional ability, impaired flexibility, improper body mechanics, and postural dysfunction.   ACTIVITY LIMITATIONS: bending, standing, squatting, stairs, transfers, bed mobility, locomotion level, and caring for others  PARTICIPATION LIMITATIONS: driving, shopping, community activity, and yard work  PERSONAL FACTORS: Age, Behavior pattern, Fitness, Past/current experiences, Time since onset of injury/illness/exacerbation, and 3+ comorbidities: Parkinson's disease, mild neurocognitive disorder due to PD, HTN, MVP, L TKA, R knee  scope, CRI, h/o renal cancer with R nephrectomy, thrombocytopenia, dyslipidemia, B macular degeneration  are also affecting patient's functional outcome.   REHAB POTENTIAL: Good  CLINICAL DECISION MAKING: Evolving/moderate complexity  EVALUATION COMPLEXITY: Moderate   GOALS: Goals reviewed with patient? Yes  SHORT TERM GOALS: Target date: 10/23/2022  Patient will be independent with initial HEP. Baseline: TBD Goal status: MET  10/18/22   2.  Patient will demonstrate appropriate sequencing of SPC or LRAD with gait for improved balance and decrease fall risk. Baseline:  Goal status: IN PROGRESS  10/18/22 - not yet attempted as majority of gait during therapy sessions have been completed without AD, 11/06/22 - patient tending to leave cane in waiting room during therapy sessions, therefore not yet assessed  3.  Patient will verbalize tips to reduce freezing/festination with gait and turns. Baseline:  Goal status: MET  10/18/22  LONG TERM GOALS: Target date: 11/20/2022  Patient will be independent with ongoing/advanced HEP for self-management at home incorporating PWR! Moves as indicated .  Baseline:  Goal status: IN PROGRESS  2.  Patient will be able to ambulate 600' with or w/o LRAD with good safety to access community.  Baseline:  Goal status: IN PROGRESS  3.  Patient will be able to step up/down curb safely with or w/o  LRAD for safety with community ambulation.  Baseline:  Goal status: IN PROGRESS   4.  Patient will demonstrate gait speed of >/= 3.9 ft/sec (1.2 m/s) to be a safe community ambulator including ability to safely cross the street.  Baseline: 2.96 ft/sec w/o AD, 3.01 ft/sec with SPC Goal status: IN PROGRESS  5.  Patient will improve 5x STS time to </= 16 seconds w/o need for UE assist to demonstrate improved functional strength and transfer efficiency. Baseline: 13.03 sec with B UE assist, unable to achieve full stand w/o UE assist Goal status: IN PROGRESS  6.   Patient will demonstrate at least 19/30 on FGA to improve gait stability and reduce risk for falls. (MCID = 4 points) Baseline: 15/30 Goal status: IN PROGRESS  7.  Patient will improve Berg score by at least 8 points or to >/= 52/56 to improve safety and stability with ADLs in standing and reduce risk for falls. (MCID = 8 points)   Baseline: TBD Goal status: IN PROGRESS  8.  Patient will report >/= 75% on ABC scale to demonstrate improved balance confidence and decreased risk for falls. Baseline: 1060 / 1600 = 66.3 % Goal status: IN PROGRESS  9. Patient will verbalize understanding of local Parkinson's disease community resources, including community fitness post d/c. Baseline:  Goal status: IN PROGRESS   PLAN:  PT FREQUENCY: 2x/week  PT DURATION: 6-8 weeks (POC for 8 weeks due to patient on vacation x 2 weeks)  PLANNED INTERVENTIONS: Therapeutic exercises, Therapeutic activity, Neuromuscular re-education, Balance training, Gait training, Patient/Family education, Self Care, Joint mobilization, Stair training, DME instructions, Aquatic Therapy, Electrical stimulation, Cryotherapy, Moist heat, Taping, Ultrasound, Manual therapy, and Re-evaluation  PLAN FOR NEXT SESSION: Assess gait pattern with cane; progress lumbopelvic flexibility and strengthening - review and update HEP PRN; progress PWR! Moves as appropriate; review tips to address freezing with gait PRN   Marry Guan, PT 11/06/2022, 3:23 PM

## 2022-11-08 ENCOUNTER — Other Ambulatory Visit: Payer: Self-pay | Admitting: Family Medicine

## 2022-11-08 ENCOUNTER — Ambulatory Visit: Payer: Medicare Other | Admitting: Physical Therapy

## 2022-11-08 ENCOUNTER — Other Ambulatory Visit (HOSPITAL_BASED_OUTPATIENT_CLINIC_OR_DEPARTMENT_OTHER): Payer: Self-pay

## 2022-11-08 MED ORDER — ATORVASTATIN CALCIUM 20 MG PO TABS
20.0000 mg | ORAL_TABLET | Freq: Every day | ORAL | 0 refills | Status: DC
  Filled 2022-11-08 – 2022-11-12 (×3): qty 90, 90d supply, fill #0

## 2022-11-09 ENCOUNTER — Other Ambulatory Visit (HOSPITAL_BASED_OUTPATIENT_CLINIC_OR_DEPARTMENT_OTHER): Payer: Self-pay

## 2022-11-12 ENCOUNTER — Other Ambulatory Visit (HOSPITAL_BASED_OUTPATIENT_CLINIC_OR_DEPARTMENT_OTHER): Payer: Self-pay

## 2022-11-12 ENCOUNTER — Encounter: Payer: Self-pay | Admitting: Neurology

## 2022-11-12 ENCOUNTER — Other Ambulatory Visit: Payer: Self-pay

## 2022-11-13 ENCOUNTER — Ambulatory Visit: Payer: Medicare Other | Admitting: Physical Therapy

## 2022-11-13 ENCOUNTER — Encounter: Payer: Self-pay | Admitting: Physical Therapy

## 2022-11-13 DIAGNOSIS — M6281 Muscle weakness (generalized): Secondary | ICD-10-CM | POA: Diagnosis not present

## 2022-11-13 DIAGNOSIS — R2681 Unsteadiness on feet: Secondary | ICD-10-CM | POA: Diagnosis not present

## 2022-11-13 DIAGNOSIS — Z9181 History of falling: Secondary | ICD-10-CM

## 2022-11-13 DIAGNOSIS — R2689 Other abnormalities of gait and mobility: Secondary | ICD-10-CM | POA: Diagnosis not present

## 2022-11-13 NOTE — Therapy (Signed)
OUTPATIENT PHYSICAL THERAPY TREATMENT   Patient Name: Vincent Walker MRN: [id number] DOB:01-23-1946, 77 y.o., male Today's Date: 11/13/2022   END OF SESSION:  PT End of Session - 11/13/22 1319     Visit Number 7    Date for PT Re-Evaluation 11/20/22    Authorization Type Medicare & Federal BCBS    PT Start Time 1317    PT Stop Time 1400    PT Time Calculation (min) 43 min    Activity Tolerance Patient tolerated treatment well    Behavior During Therapy [facility] for tasks assessed/performed;Flat affect               Past Medical History:  Diagnosis Date   Adenomatous polyp of ascending colon 10/09/2018   Allergies 07/07/2018   Arthritis    Benign essential hypertension 02/08/2015   Chronic renal insufficiency 02/14/2015   Constipation 12/23/2015   Dyslipidemia 02/08/2015   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 Required nasal packing a couple of days ago.  Had a similar episode of epistaxis a little over a year ago.  At that visit, I could not see the exact bleeding spot. EXAMINATION after packing removal reveals several excoriated areas anteriorly but I was unable to see t   Glaucoma    History of blood transfusion 2010   After Kidney surgery   History of chicken pox    History of renal cell carcinoma 02/08/2015   diagnosed and removed in 2010 Right Monitored by Dr Gaynelle Arabian of urology at [facility] Dr Karin Lieu, nephrology at [facility]   Hyperglycemia 12/23/2015   Hyperlipidemia    Increased thyroid stimulating hormone (TSH) level 06/07/2017   Ingrown left big toenail 02/14/2015   Left foot pain 02/14/2015   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   MVP (mitral valve prolapse) 05/14/2016   Parkinson's disease 12/23/2015   Right hip pain 12/12/2016   Thrombocytopenia 02/08/2015   Tremor of right hand 02/14/2015   Past Surgical  History:  Procedure Laterality Date   APPENDECTOMY  1995   CATARACT EXTRACTION, BILATERAL     COLONOSCOPY     COLONOSCOPY WITH PROPOFOL N/A 12/17/2018   Procedure: COLONOSCOPY WITH PROPOFOL;  Surgeon: Lemar Lofty., MD;  Location: [facility] ENDOSCOPY;  Service: Gastroenterology;  Laterality: N/A;   ENDOSCOPIC MUCOSAL RESECTION N/A 12/17/2018   Procedure: ENDOSCOPIC MUCOSAL RESECTION;  Surgeon: Meridee Score Netty Starring., MD;  Location: [facility] ENDOSCOPY;  Service: Gastroenterology;  Laterality: N/A;   HEMOSTASIS CLIP PLACEMENT  12/17/2018   Procedure: HEMOSTASIS CLIP PLACEMENT;  Surgeon: Lemar Lofty., MD;  Location: [facility] ENDOSCOPY;  Service: Gastroenterology;;   left knee scope  2003   POLYPECTOMY  12/17/2018   Procedure: POLYPECTOMY;  Surgeon: Meridee Score Netty Starring., MD;  Location: [facility] ENDOSCOPY;  Service: Gastroenterology;;   right knee scope  1999   SUBMUCOSAL LIFTING INJECTION  12/17/2018   Procedure: SUBMUCOSAL LIFTING INJECTION;  Surgeon: Lemar Lofty., MD;  Location: [facility] ENDOSCOPY;  Service: Gastroenterology;;   TOE SURGERY Left    metal 2nd toe- and top of foot- straighten bone   TONSILLECTOMY     TOTAL KNEE ARTHROPLASTY Left 07/06/2014   TOTAL NEPHRECTOMY Right    Patient Active Problem List   Diagnosis Date Noted   Flank pain 07/19/2022   Eustachian tube dysfunction, left 02/21/2021   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   Early stage nonexudative age-related macular degeneration  of both eyes 03/09/2019   Adenomatous polyp of ascending colon 10/09/2018   Epistaxis 07/07/2018   Allergies 07/07/2018   Obesity 06/15/2018   Increased thyroid stimulating hormone (TSH) level 06/07/2017   Right hip pain 12/12/2016   MVP (mitral valve prolapse) 05/14/2016   Parkinson's disease 12/23/2015   Constipation 12/23/2015   Hyperglycemia 12/23/2015   Tremor of right hand 02/14/2015   Left foot pain 02/14/2015   Chronic renal insufficiency  02/14/2015   Benign essential hypertension 02/08/2015   Dyslipidemia 02/08/2015   History of renal cell carcinoma 02/08/2015   Thrombocytopenia 02/08/2015   History of chicken pox     PCP: Bradd Canary, MD   REFERRING PROVIDER: Vladimir Faster, DO   REFERRING DIAG: G20.A1 (ICD-10-CM) - Parkinson's disease without dyskinesia or fluctuating manifestations   THERAPY DIAG:  Other abnormalities of gait and mobility  Unsteadiness on feet  Muscle weakness (generalized)  History of falling  RATIONALE FOR EVALUATION AND TREATMENT: Rehabilitation  ONSET DATE: PD diagnosis 4+ years ago   NEXT MD VISIT: 02/25/23   SUBJECTIVE:                                                                                                                                                                                                         SUBJECTIVE STATEMENT: Pt reports knee swelling/tightness remains a fairly constant "annoyance" more so than pain. He has tried compression stockings but they do not seem to help. He states he does not feel limited by his knees. No back pain.  PAIN: Are you having pain? Yes: NPRS scale:  0/10 Pain location: back Pain description: dull ache Aggravating factors: bending over for long periods Relieving factors: heat  PAIN:  Are you having pain? Yes: NPRS scale: 7/10 Pain location: B knees Pain description: tight/swollen, "annoyance" Aggravating factors: unpredictable Relieving factors: "bear through it'   PERTINENT HISTORY:  Parkinson's disease, mild neurocognitive disorder due to PD, HTN, MVP, L TKA, R knee scope, CRI, h/o renal cancer with R nephrectomy, thrombocytopenia, dyslipidemia, B macular degeneration    PRECAUTIONS: Fall  WEIGHT BEARING RESTRICTIONS: No  FALLS:  Has patient fallen in last 6 months? Yes. Number of falls 1  LIVING ENVIRONMENT: Lives with: lives with their spouse Lives in: House/apartment Stairs: No Has following equipment at  home: Single point cane, Environmental consultant - 2 wheeled, Grab bars, and Recumbent Bike  OCCUPATION: Retired  PLOF: Independent and Leisure: mostly sedentary recently (previously he had been completing HEP exercises from prior PT episode + some other exercises, bike for 20-30 min/day, work  with weights)     PATIENT GOALS: "To get going again."   OBJECTIVE: (objective measures completed at initial evaluation unless otherwise dated)  DIAGNOSTIC FINDINGS:  07/19/22 - R hip and pelvis x-ray: FINDINGS: Mildly decreased bone mineralization. Mild bilateral sacroiliac subchondral sclerosis. The pubic symphysis joint space is maintained.   Mild bilateral superior femoroacetabular joint space narrowing. Mild-to-moderate right and mild left superolateral acetabular degenerative osteophytes. Normal morphology of the right femoral head-neck junction without CAM-type bump deformity. No acute fracture or dislocation.   Mild atherosclerotic calcifications. Multiple vascular phleboliths again overlie the pelvis.   IMPRESSION: 1. No acute fracture. 2. 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  COGNITION: Overall cognitive status: Impaired - decreased short-term memory and recall of new information   SENSATION: WFL  COORDINATION: B heel-toe mildly diminished. Impaired heel to shin bilaterally.  EDEMA:  N/A  MUSCLE TONE: Increased LE muscle tone noted during attempts at PROM/flexibility assessment.  MUSCLE LENGTH: Hamstrings: mod tight B ITB: mild tight B Piriformis: mod tight B Hip flexors: mod tight B Quads: mild tight B Heelcord: mod tight R, mild tight L  POSTURE:  rounded shoulders, forward head, decreased lumbar lordosis, increased thoracic kyphosis, flexed trunk , and weight shift right  LOWER EXTREMITY ROM:    Mildly limited B hip and ankle ROM (R>L), mostly due to limited muscle flexibility as above.  LOWER EXTREMITY MMT:    MMT Right Eval Left Eval  Hip  flexion 4+ 4+  Hip extension 4 4+  Hip abduction 4 4+  Hip adduction 4+ 4+  Hip internal rotation 5 5  Hip external rotation 4+ 4+  Knee flexion 4+ 4+  Knee extension 4+ 4+  Ankle dorsiflexion 4+ 4+  Ankle plantarflexion 4 4  Ankle inversion    Ankle eversion    (Blank rows = not tested)  BED MOBILITY:  Supine to/from sit and rolling with SBA/CGA as well as min assist to lift leg onto treatment table  TRANSFERS: Assistive device utilized: Single point cane and None  Sit to stand: Modified independence Stand to sit: Modified independence Chair to chair: Modified independence Floor:  NT  GAIT: Distance walked: 120 ft Assistive device utilized: Single point cane and None Level of assistance: SBA Gait pattern: decreased arm swing- Right, decreased arm swing- Left, decreased stride length, decreased hip/knee flexion- Right, decreased hip/knee flexion- Left, decreased trunk rotation, trunk flexed, poor foot clearance- Right, and poor foot clearance- Left Comments: Stooped posture with decreased reciprocal movement patterns and decreased stride length along with poor sequencing of cane  RAMP: Level of Assistance:  NT Assistive device utilized:  NT Ramp Comments:   CURB:  Level of Assistance:  NT Assistive device utilized:  NT Curb Comments:   STAIRS:  Level of Assistance: SBA  Stair Negotiation Technique: Alternating Pattern  with Single Rail on Right  Number of Stairs: 14   Height of Stairs: 7"  Comments: Patient is very hesitant on stairs with 2 hands on rail for most of ascent and descent  FUNCTIONAL TESTS:  5 times sit to stand: 13.03 sec with B UE assist, unable to achieve full stand w/o UE assist Timed up and go (TUG): Normal = 11.06 sec, Manual = 12.09 sec, Cognitive = 14.56 sec 10 meter walk test: 11.07 sec w/o AD, 10.88 sec with SPC; Gait speed = 2.96 ft/sec w/o AD, 3.01 ft/sec with SPC Berg Balance Scale: 43/56, 37-45 significant risk for falls  (>80%) Functional gait assessment: 15/30; < 19 = high  risk fall   Baseline as of 04/10/22 PT PD Screen: 5xSTS: 15.90 sec - pt notes limited by knees TUG: 10.37 sec (Normal), 12.16 sec (Manual), 12.93 sec (Cognitive) : 9.75 sec Gait speed = 3.36 ft/sec FGA = 20/30; 19-24 = medium risk fall   Baseline as of 09/26/21 PT PD Screen: 5xSTS: 12.47 sec TUG: 7.91 sec (Normal), 8.87 sec (Manual), 10.81 sec (Cognitive) : 7.69 sec  Gait speed = 4.27 ft/sec  Baseline as of D/C from last PT episode (04/06/21): 5xSTS: 12.65 sec TUG: 8.47 sec (Normal), 10.38 sec (Manual), 10.66 (Cognitive) : 6.66 sec Gait speed = 4.92 ft/sec  Berg: 53/56; 52-55 = lower risk for falls (> 25%)  FGA = 25/30; 25-28 = low risk fall   PATIENT SURVEYS:  ABC scale 1060 / 1600 = 66.3 %   TODAY'S TREATMENT:   11/13/22 THERAPEUTIC EXERCISE: to improve flexibility, strength and mobility.  Demonstration, verbal and tactile cues throughout for technique.  NuStep - L6 x 6 min (UE/LE to promote improved reciprocal movement patterns)  MANUAL THERAPY: To promote normalized muscle tension, improved flexibility, improved joint mobility, reduced edema, and reduced sensation of tightness .  Performed edema management techniques to L LE in attempt to reduce feeling of swelling/tightness in his L knee - reduction in pitting edema noted but patient still feeling tight in his knee STM/DTM and manual TPR to left quads Manual stretching to L quads and prone with pillow under abdomen/hips  NEUROMUSCULAR RE-EDUCATION: To improve posture, balance, coordination, reduce fall risk, amplitude of movement, speed of movement to reduce bradykinesia, and reduce rigidity. Prone PWR! Up, Rock, Twist & Step x 10 - pillows under torso and abdomen/hips for support and offloading of low back, however still with limited tolerance  GAIT TRAINING: To normalize gait pattern and improve safety with SPC versus hiking poles . 270 feet with SPC in R  hand - hand initially adjacent to R hip for all steps, therefore provided cues for reciprocal step pattern coordinating cane with movement of L foot as we use for increased hip and knee flexion with heel strike on weight acceptance to reduce shuffling gait 270 feet with single hiking pole in R hand - continued cues for reciprocal step pattern increased reciprocal arm swing - slightly better pattern then with [facility] but continued practice indicated   11/06/22 THERAPEUTIC EXERCISE: to improve flexibility, strength and mobility.  Demonstration, verbal and tactile cues throughout for technique.  NuStep - L5 x 6 min (UE/LE to promote improved reciprocal movement patterns) Seated R/L QL stretches x 30" each Seated 3-way lumbar flexion stretch with UE support on SPC x 30" each SLS + RTB 4-way SLR x 10 each, UE support on back of chair  NEUROMUSCULAR RE-EDUCATION: To improve posture, balance, proprioception, coordination, reduce fall risk, amplitude of movement, speed of movement to reduce bradykinesia, and reduce rigidity. Seated PWR! Up, Rock, Twist & Step x 10 Standing PWR! Up, Rock, Twist & Step 2 x 10    10/18/22 THERAPEUTIC EXERCISE: to improve flexibility, strength and mobility.  Demonstration, verbal and tactile cues throughout for technique.  NuStep - L4 x 6 min (UE/LE to promote improved reciprocal movement patterns)  SELF CARE/GAIT:  Provided education on tips to prevent freezing with transfers and gait - practiced these while walking through clinic and approaching a chair   NEUROMUSCULAR RE-EDUCATION: To improve posture, balance, proprioception, coordination, reduce fall risk, amplitude of movement, speed of movement to reduce bradykinesia, and reduce rigidity. Seated PWR! Up, Rock, Twist &  Step x 10 Standing PWR! Up, Rock, Twist & Step x 10   PATIENT EDUCATION:  Education details: gait safety with SPC vs hiking pole(s)   Person educated: Patient Education method: Explanation,  Demonstration, and Verbal cues Education comprehension: verbalized understanding, returned demonstration, verbal cues required, and needs further education  HOME EXERCISE PROGRAM: Access Code: [email or web address] URL: [email or web address] Date: 11/06/2022 Prepared by: Glenetta Hew  Exercises - Supine Lower Trunk Rotation  - 2 x daily - 7 x weekly - 2 sets - 10 reps - 10 sec hold - Seated Thoracic Lumbar Extension with Pectoralis Stretch  - 2 x daily - 7 x weekly - 2 sets - 10 reps - 5 sec hold - Seated Hamstring Stretch  - 2 x daily - 7 x weekly - 3 reps - 30 sec hold - Seated Figure 4 Piriformis Stretch  - 2 x daily - 7 x weekly - 3 reps - 30 sec hold - Seated Piriformis Stretch  - 2 x daily - 7 x weekly - 3 reps - 30 sec hold - Seated Hip Flexor Stretch  - 2 x daily - 7 x weekly - 3 reps - 30 sec hold - Standing Gastroc Stretch at Counter  - 2 x daily - 7 x weekly - 3 reps - 30 sec hold - Seated Quadratus Lumborum Stretch in Chair  - 2 x daily - 7 x weekly - 3 reps - 30 sec hold - Seated Flexion Stretch with Swiss Ball  - 2 x daily - 7 x weekly - 3 reps - 30 sec hold - Seated Thoracic Flexion and Rotation with Swiss Ball  - 2 x daily - 7 x weekly - 3 reps - 30 sec hold  Patient Education - Tips to reduce freezing episodes with standing or walking  PWR! Moves: - Supine - Seated - Standing   ASSESSMENT:  CLINICAL IMPRESSION: Jaggar continues to report discomfort due to perceived swelling/tightness in his knees, L>R.  Some pitting edema noted in distal L LE, however minimal edema present at knee or and proximal LE but increased quad tension noted.  Edema management techniques utilized to help promote reduction in edema with return of excess fluid into the central circulation, followed by manual STM and stretching to reduce quad tension.  Patient noting continued feeling of tightness/swelling in L knee even after manual therapy.  While in prone for quad stretching, introduced prone  PWR! Moves, however limited tolerance due to strain on back when in POE even with pillows under torso and abdomen/hips as well as limited tolerance for upper body weightbearing in POE.  Deferred addition of prone PWR! Moves for HEP.  Remainder of session focusing on gait sequencing with SPC as patient noted to keep SPC in line with right leg rather than using reciprocal pattern while ambulating.  Initiated trial of single hiking/walking pole as alternative to [facility] to promote better upright posture and increased reach for reciprocal arm swing but continued practice necessary.  Kamarrion will benefit from continued skilled PT to address ongoing pain, flexibility, range of motion, strength and balance deficits to improve mobility and activity tolerance with decreased pain interference and decrease risk for falls.   OBJECTIVE IMPAIRMENTS: Abnormal gait, decreased activity tolerance, decreased balance, decreased knowledge of condition, decreased knowledge of use of DME, decreased mobility, difficulty walking, decreased ROM, decreased strength, decreased safety awareness, increased fascial restrictions, impaired perceived functional ability, impaired flexibility, improper body mechanics, and postural dysfunction.   ACTIVITY LIMITATIONS: bending, standing, squatting, stairs,  transfers, bed mobility, locomotion level, and caring for others  PARTICIPATION LIMITATIONS: driving, shopping, community activity, and yard work  PERSONAL FACTORS: Age, Behavior pattern, Fitness, Past/current experiences, Time since onset of injury/illness/exacerbation, and 3+ comorbidities: Parkinson's disease, mild neurocognitive disorder due to PD, HTN, MVP, L TKA, R knee scope, CRI, h/o renal cancer with R nephrectomy, thrombocytopenia, dyslipidemia, B macular degeneration  are also affecting patient's functional outcome.   REHAB POTENTIAL: Good  CLINICAL DECISION MAKING: Evolving/moderate complexity  EVALUATION COMPLEXITY:  Moderate   GOALS: Goals reviewed with patient? Yes  SHORT TERM GOALS: Target date: 10/23/2022  Patient will be independent with initial HEP. Baseline: TBD Goal status: MET  10/18/22   2.  Patient will demonstrate appropriate sequencing of SPC or LRAD with gait for improved balance and decrease fall risk. Baseline:  Goal status: IN PROGRESS  10/18/22 - not yet attempted as majority of gait during therapy sessions have been completed without AD; 11/06/22 - patient tending to leave cane in waiting room during therapy sessions, therefore not yet assessed; 11/13/22 - decreased reciprocal movement pattern with SPC and/or single hiking/walking pole  3.  Patient will verbalize tips to reduce freezing/festination with gait and turns. Baseline:  Goal status: MET  10/18/22  LONG TERM GOALS: Target date: 11/20/2022  Patient will be independent with ongoing/advanced HEP for self-management at home incorporating PWR! Moves as indicated .  Baseline:  Goal status: IN PROGRESS  2.  Patient will be able to ambulate 600' with or w/o LRAD with good safety to access community.  Baseline:  Goal status: IN PROGRESS  3.  Patient will be able to step up/down curb safely with or w/o LRAD for safety with community ambulation.  Baseline:  Goal status: IN PROGRESS   4.  Patient will demonstrate gait speed of >/= 3.9 ft/sec (1.2 m/s) to be a safe community ambulator including ability to safely cross the street.  Baseline: 2.96 ft/sec w/o AD, 3.01 ft/sec with SPC Goal status: IN PROGRESS  5.  Patient will improve 5x STS time to </= 16 seconds w/o need for UE assist to demonstrate improved functional strength and transfer efficiency. Baseline: 13.03 sec with B UE assist, unable to achieve full stand w/o UE assist Goal status: IN PROGRESS  6.  Patient will demonstrate at least 19/30 on FGA to improve gait stability and reduce risk for falls. (MCID = 4 points) Baseline: 15/30 Goal status: IN PROGRESS  7.   Patient will improve Berg score by at least 8 points or to >/= 52/56 to improve safety and stability with ADLs in standing and reduce risk for falls. (MCID = 8 points)   Baseline: TBD Goal status: IN PROGRESS  8.  Patient will report >/= 75% on ABC scale to demonstrate improved balance confidence and decreased risk for falls. Baseline: 1060 / 1600 = 66.3 % Goal status: IN PROGRESS  9. Patient will verbalize understanding of local Parkinson's disease community resources, including community fitness post d/c. Baseline:  Goal status: IN PROGRESS   PLAN:  PT FREQUENCY: 2x/week  PT DURATION: 6-8 weeks (POC for 8 weeks due to patient on vacation x 2 weeks)  PLANNED INTERVENTIONS: Therapeutic exercises, Therapeutic activity, Neuromuscular re-education, Balance training, Gait training, Patient/Family education, Self Care, Joint mobilization, Stair training, DME instructions, Aquatic Therapy, Electrical stimulation, Cryotherapy, Moist heat, Taping, Ultrasound, Manual therapy, and Re-evaluation  PLAN FOR NEXT SESSION: Gait training with SPC and/or single or bilateral hiking/walking poles focusing on upright posture, reciprocal gait pattern and good foot  clearance; initiate goal assessment to determine potential need for recert vs readiness for transition to HEP; progress lumbopelvic flexibility and strengthening - review and update HEP PRN; progress PWR! Moves as appropriate; review tips to address freezing with gait PRN   Marry Guan, PT 11/13/2022, 3:52 PM

## 2022-11-15 ENCOUNTER — Encounter: Payer: Self-pay | Admitting: Physical Therapy

## 2022-11-15 ENCOUNTER — Ambulatory Visit: Payer: Medicare Other | Admitting: Physical Therapy

## 2022-11-15 DIAGNOSIS — Z9181 History of falling: Secondary | ICD-10-CM

## 2022-11-15 DIAGNOSIS — M6281 Muscle weakness (generalized): Secondary | ICD-10-CM | POA: Diagnosis not present

## 2022-11-15 DIAGNOSIS — R2681 Unsteadiness on feet: Secondary | ICD-10-CM | POA: Diagnosis not present

## 2022-11-15 DIAGNOSIS — R2689 Other abnormalities of gait and mobility: Secondary | ICD-10-CM | POA: Diagnosis not present

## 2022-11-15 NOTE — Therapy (Signed)
OUTPATIENT PHYSICAL THERAPY TREATMENT   Patient Name: Vincent Walker MRN: [id number] DOB:[birth year], 77 y.o., male Today's Date: 11/15/2022   END OF SESSION:  PT End of Session - 11/15/22 1310     Visit Number 8    Date for PT Re-Evaluation 11/20/22    Authorization Type Medicare & Federal BCBS    PT Start Time 1310    PT Stop Time 1412    PT Time Calculation (min) 62 min    Activity Tolerance Patient tolerated treatment well    Behavior During Therapy [facility] for tasks assessed/performed;Flat affect                Past Medical History:  Diagnosis Date   Adenomatous polyp of ascending colon 10/09/2018   Allergies 07/07/2018   Arthritis    Benign essential hypertension 02/08/2015   Chronic renal insufficiency 02/14/2015   Constipation 12/23/2015   Dyslipidemia 02/08/2015   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 Required nasal packing a couple of days ago.  Had a similar episode of epistaxis a little over a year ago.  At that visit, I could not see the exact bleeding spot. EXAMINATION after packing removal reveals several excoriated areas anteriorly but I was unable to see t   Glaucoma    History of blood transfusion 2010   After Kidney surgery   History of chicken pox    History of renal cell carcinoma 02/08/2015   diagnosed and removed in 2010 Right Monitored by Dr Gaynelle Arabian of urology at [facility] Dr Karin Lieu, nephrology at [facility]   Hyperglycemia 12/23/2015   Hyperlipidemia    Increased thyroid stimulating hormone (TSH) level 06/07/2017   Ingrown left big toenail 02/14/2015   Left foot pain 02/14/2015   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   MVP (mitral valve prolapse) 05/14/2016   Parkinson's disease 12/23/2015   Right hip pain 12/12/2016   Thrombocytopenia 02/08/2015   Tremor of right hand 02/14/2015   Past Surgical  History:  Procedure Laterality Date   APPENDECTOMY  1995   CATARACT EXTRACTION, BILATERAL     COLONOSCOPY     COLONOSCOPY WITH PROPOFOL N/A 12/17/2018   Procedure: COLONOSCOPY WITH PROPOFOL;  Surgeon: Lemar Lofty., MD;  Location: [facility] ENDOSCOPY;  Service: Gastroenterology;  Laterality: N/A;   ENDOSCOPIC MUCOSAL RESECTION N/A 12/17/2018   Procedure: ENDOSCOPIC MUCOSAL RESECTION;  Surgeon: Meridee Score Netty Starring., MD;  Location: [facility] ENDOSCOPY;  Service: Gastroenterology;  Laterality: N/A;   HEMOSTASIS CLIP PLACEMENT  12/17/2018   Procedure: HEMOSTASIS CLIP PLACEMENT;  Surgeon: Lemar Lofty., MD;  Location: [facility] ENDOSCOPY;  Service: Gastroenterology;;   left knee scope  2003   POLYPECTOMY  12/17/2018   Procedure: POLYPECTOMY;  Surgeon: Meridee Score Netty Starring., MD;  Location: [facility] ENDOSCOPY;  Service: Gastroenterology;;   right knee scope  1999   SUBMUCOSAL LIFTING INJECTION  12/17/2018   Procedure: SUBMUCOSAL LIFTING INJECTION;  Surgeon: Lemar Lofty., MD;  Location: [facility] ENDOSCOPY;  Service: Gastroenterology;;   TOE SURGERY Left    metal 2nd toe- and top of foot- straighten bone   TONSILLECTOMY     TOTAL KNEE ARTHROPLASTY Left 07/06/2014   TOTAL NEPHRECTOMY Right    Patient Active Problem List   Diagnosis Date Noted   Flank pain 07/19/2022   Eustachian tube dysfunction, left 02/21/2021   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   Early stage nonexudative age-related macular  degeneration of both eyes 03/09/2019   Adenomatous polyp of ascending colon 10/09/2018   Epistaxis 07/07/2018   Allergies 07/07/2018   Obesity 06/15/2018   Increased thyroid stimulating hormone (TSH) level 06/07/2017   Right hip pain 12/12/2016   MVP (mitral valve prolapse) 05/14/2016   Parkinson's disease 12/23/2015   Constipation 12/23/2015   Hyperglycemia 12/23/2015   Tremor of right hand 02/14/2015   Left foot pain 02/14/2015   Chronic renal insufficiency  02/14/2015   Benign essential hypertension 02/08/2015   Dyslipidemia 02/08/2015   History of renal cell carcinoma 02/08/2015   Thrombocytopenia 02/08/2015   History of chicken pox     PCP: Bradd Canary, MD   REFERRING PROVIDER: Vladimir Faster, DO   REFERRING DIAG: G20.A1 (ICD-10-CM) - Parkinson's disease without dyskinesia or fluctuating manifestations   THERAPY DIAG:  Other abnormalities of gait and mobility  Unsteadiness on feet  Muscle weakness (generalized)  History of falling  RATIONALE FOR EVALUATION AND TREATMENT: Rehabilitation  ONSET DATE: PD diagnosis 4+ years ago   NEXT MD VISIT: 02/25/23   SUBJECTIVE:                                                                                                                                                                                                         SUBJECTIVE STATEMENT: Pt reports he has had a rough week as he and his wife have been trying to adjust their schedule including sleep and med schedules and it has not been going well. L knee still feels really tight.  PAIN: Are you having pain? Yes: NPRS scale: at times up to 8/10 Pain location: back Pain description: dull ache Aggravating factors: bending over or standing for long periods Relieving factors: heat  Are you having pain? Yes: NPRS scale: 7/10 Pain location: L knee Pain description: tight/swollen, "annoyance" Aggravating factors: unpredictable Relieving factors: "bear through it'   PERTINENT HISTORY:  Parkinson's disease, mild neurocognitive disorder due to PD, HTN, MVP, L TKA, R knee scope, CRI, h/o renal cancer with R nephrectomy, thrombocytopenia, dyslipidemia, B macular degeneration    PRECAUTIONS: Fall  WEIGHT BEARING RESTRICTIONS: No  FALLS:  Has patient fallen in last 6 months? Yes. Number of falls 1  LIVING ENVIRONMENT: Lives with: lives with their spouse Lives in: House/apartment Stairs: No Has following equipment at home:  Single point cane, Environmental consultant - 2 wheeled, Grab bars, and Recumbent Bike  OCCUPATION: Retired  PLOF: Independent and Leisure: mostly sedentary recently (previously he had been completing HEP exercises from prior PT episode + some other exercises, bike  for 20-30 min/Walker, work with weights)     PATIENT GOALS: "To get going again."   OBJECTIVE: (objective measures completed at initial evaluation unless otherwise dated)  DIAGNOSTIC FINDINGS:  07/19/22 - R hip and pelvis x-ray: FINDINGS: Mildly decreased bone mineralization. Mild bilateral sacroiliac subchondral sclerosis. The pubic symphysis joint space is maintained.   Mild bilateral superior femoroacetabular joint space narrowing. Mild-to-moderate right and mild left superolateral acetabular degenerative osteophytes. Normal morphology of the right femoral head-neck junction without CAM-type bump deformity. No acute fracture or dislocation.   Mild atherosclerotic calcifications. Multiple vascular phleboliths again overlie the pelvis.   IMPRESSION: 1. No acute fracture. 2. 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  COGNITION: Overall cognitive status: Impaired - decreased short-term memory and recall of new information   SENSATION: WFL  COORDINATION: B heel-toe mildly diminished. Impaired heel to shin bilaterally.  EDEMA:  N/A  MUSCLE TONE: Increased LE muscle tone noted during attempts at PROM/flexibility assessment.  MUSCLE LENGTH: Hamstrings: mod tight B ITB: mild tight B Piriformis: mod tight B Hip flexors: mod tight B Quads: mild tight B Heelcord: mod tight R, mild tight L  POSTURE:  rounded shoulders, forward head, decreased lumbar lordosis, increased thoracic kyphosis, flexed trunk , and weight shift right  LOWER EXTREMITY ROM:    Mildly limited B hip and ankle ROM (R>L), mostly due to limited muscle flexibility as above.  LOWER EXTREMITY MMT:    MMT Right Eval Left Eval  Hip  flexion 4+ 4+  Hip extension 4 4+  Hip abduction 4 4+  Hip adduction 4+ 4+  Hip internal rotation 5 5  Hip external rotation 4+ 4+  Knee flexion 4+ 4+  Knee extension 4+ 4+  Ankle dorsiflexion 4+ 4+  Ankle plantarflexion 4 4  Ankle inversion    Ankle eversion    (Blank rows = not tested)  BED MOBILITY:  Supine to/from sit and rolling with SBA/CGA as well as min assist to lift leg onto treatment table  TRANSFERS: Assistive device utilized: Single point cane and None  Sit to stand: Modified independence Stand to sit: Modified independence Chair to chair: Modified independence Floor:  NT  GAIT: Distance walked: 120 ft Assistive device utilized: Single point cane and None Level of assistance: SBA Gait pattern: decreased arm swing- Right, decreased arm swing- Left, decreased stride length, decreased hip/knee flexion- Right, decreased hip/knee flexion- Left, decreased trunk rotation, trunk flexed, poor foot clearance- Right, and poor foot clearance- Left Comments: Stooped posture with decreased reciprocal movement patterns and decreased stride length along with poor sequencing of cane  RAMP: Level of Assistance:  NT Assistive device utilized:  NT Ramp Comments:   CURB:  Level of Assistance:  NT Assistive device utilized:  NT Curb Comments:   STAIRS:  Level of Assistance: SBA  Stair Negotiation Technique: Alternating Pattern  with Single Rail on Right  Number of Stairs: 14   Height of Stairs: 7"  Comments: Patient is very hesitant on stairs with 2 hands on rail for most of ascent and descent  FUNCTIONAL TESTS:  5 times sit to stand: 13.03 sec with B UE assist, unable to achieve full stand w/o UE assist Timed up and go (TUG): Normal = 11.06 sec, Manual = 12.09 sec, Cognitive = 14.56 sec 10 meter walk test: 11.07 sec w/o AD, 10.88 sec with SPC; Gait speed = 2.96 ft/sec w/o AD, 3.01 ft/sec with SPC Berg Balance Scale: 43/56, 37-45 significant risk for falls  (>80%) Functional gait assessment: 15/30; <  19 = high risk fall   11/15/22: 5xSTS = 13.88 sec w/o UE assist (occasional hands on knees) TUG: Normal = 9.93 sec, Manual = 10.19 sec, Cognitive = 13.16 sec (unable to complete cognitive task) w/o AD = 10.62 sec, 9.32 sec (fastest comfortable speed w/o AD) with SPC = 11.59 sec with B hiking poles = 11.62 sec Gait speed = 3.09 ft/sec w/o AD, 2.83 ft/sec with SPC, 2.82 ft/sec with B hiking poles; 3.52 ft/sec (fastest comfortable speed w/o AD)  Baseline as of 04/10/22 PT PD Screen: 5xSTS: 15.90 sec - pt notes limited by knees TUG: 10.37 sec (Normal), 12.16 sec (Manual), 12.93 sec (Cognitive) : 9.75 sec Gait speed = 3.36 ft/sec FGA = 20/30; 19-24 = medium risk fall   Baseline as of 09/26/21 PT PD Screen: 5xSTS: 12.47 sec TUG: 7.91 sec (Normal), 8.87 sec (Manual), 10.81 sec (Cognitive) : 7.69 sec  Gait speed = 4.27 ft/sec  Baseline as of D/C from last PT episode (04/06/21): 5xSTS: 12.65 sec TUG: 8.47 sec (Normal), 10.38 sec (Manual), 10.66 (Cognitive) : 6.66 sec Gait speed = 4.92 ft/sec  Berg: 53/56; 52-55 = lower risk for falls (> 25%)  FGA = 25/30; 25-28 = low risk fall   PATIENT SURVEYS:  ABC scale 1060 / 1600 = 66.3 %   TODAY'S TREATMENT:   11/15/22 THERAPEUTIC EXERCISE: to improve flexibility, strength and mobility.  Demonstration, verbal and tactile cues throughout for technique.  NuStep - L6 x 6 min (UE/LE to promote improved reciprocal movement patterns)  GAIT TRAINING: To normalize gait pattern and improve safety with SPC vs hiking poles .  450 feet with B hiking poles in clinic - verbal and tactile cues for reciprocal step pattern increased reciprocal arm swing, as well as VC for increased hip and knee flexion targeting heel strike on weight acceptance for increased foot clearance 250 feet each with SPC and B hiking poles on outdoor surfaces including sidewalk, pavement, grass, and inclines -  intermittent verbal cues necessary for reciprocal pattern (esp with hiking poles) as well as heel strike to increase stride length and foot clearance  Stairs: Level of Assistance: Modified independence and SBA Stair Negotiation Technique: Alternating Pattern  Forwards With use of AD: SPC  with Single Rail on Left Number of Stairs: 2 x 14 (descent only)  Height of Stairs: 7"  Comments: pt with tendency to lean back while descending stairs or curb  THERAPEUTIC ACTIVITIES: Sit to stand utilizing PWR! Up for facilitation of fwd weight shift x 10 5xSTS = 13.88 sec w/o UE assist (occasional hands on knees) TUG: Normal = 9.93 sec, Manual = 10.19 sec, Cognitive = 13.16 sec (unable to complete cognitive task) w/o AD = 10.62 sec, 9.32 sec (fastest comfortable speed w/o AD) with SPC = 11.59 sec with B hiking poles = 11.62 sec Gait speed = 3.09 ft/sec w/o AD, 2.83 ft/sec with SPC, 2.82 ft/sec with B hiking poles; 3.52 ft/sec (fastest comfortable speed w/o AD)   11/13/22 THERAPEUTIC EXERCISE: to improve flexibility, strength and mobility.  Demonstration, verbal and tactile cues throughout for technique.  NuStep - L6 x 6 min (UE/LE to promote improved reciprocal movement patterns)  MANUAL THERAPY: To promote normalized muscle tension, improved flexibility, improved joint mobility, reduced edema, and reduced sensation of tightness .  Performed edema management techniques to L LE in attempt to reduce feeling of swelling/tightness in his L knee - reduction in pitting edema noted but patient still feeling tight in  his knee STM/DTM and manual TPR to left quads Manual stretching to L quads and prone with pillow under abdomen/hips  NEUROMUSCULAR RE-EDUCATION: To improve posture, balance, coordination, reduce fall risk, amplitude of movement, speed of movement to reduce bradykinesia, and reduce rigidity. Prone PWR! Up, Rock, Twist & Step x 10 - pillows under torso and abdomen/hips for  support and offloading of low back, however still with limited tolerance  GAIT TRAINING: To normalize gait pattern and improve safety with SPC versus hiking poles . 270 feet with SPC in R hand - hand initially adjacent to R hip for all steps, therefore provided cues for reciprocal step pattern coordinating cane with movement of L foot as we use for increased hip and knee flexion with heel strike on weight acceptance to reduce shuffling gait 270 feet with single hiking pole in R hand - continued cues for reciprocal step pattern increased reciprocal arm swing - slightly better pattern than with SPC but continued practice indicated   11/06/22 THERAPEUTIC EXERCISE: to improve flexibility, strength and mobility.  Demonstration, verbal and tactile cues throughout for technique.  NuStep - L5 x 6 min (UE/LE to promote improved reciprocal movement patterns) Seated R/L QL stretches x 30" each Seated 3-way lumbar flexion stretch with UE support on SPC x 30" each SLS + RTB 4-way SLR x 10 each, UE support on back of chair  NEUROMUSCULAR RE-EDUCATION: To improve posture, balance, proprioception, coordination, reduce fall risk, amplitude of movement, speed of movement to reduce bradykinesia, and reduce rigidity. Seated PWR! Up, Rock, Twist & Step x 10 Standing PWR! Up, Rock, Twist & Step 2 x 10   PATIENT EDUCATION:  Education details: progress with PT, ongoing PT POC, and gait safety with SPC vs hiking pole(s)   Person educated: Patient Education method: Explanation, Demonstration, and Verbal cues Education comprehension: verbalized understanding, returned demonstration, verbal cues required, and needs further education  HOME EXERCISE PROGRAM: Access Code: [email or web address] URL: [email or web address] Date: 11/06/2022 Prepared by: Glenetta Hew  Exercises - Supine Lower Trunk Rotation  - 2 x daily - 7 x weekly - 2 sets - 10 reps - 10 sec hold - Seated Thoracic Lumbar Extension with Pectoralis  Stretch  - 2 x daily - 7 x weekly - 2 sets - 10 reps - 5 sec hold - Seated Hamstring Stretch  - 2 x daily - 7 x weekly - 3 reps - 30 sec hold - Seated Figure 4 Piriformis Stretch  - 2 x daily - 7 x weekly - 3 reps - 30 sec hold - Seated Piriformis Stretch  - 2 x daily - 7 x weekly - 3 reps - 30 sec hold - Seated Hip Flexor Stretch  - 2 x daily - 7 x weekly - 3 reps - 30 sec hold - Standing Gastroc Stretch at Counter  - 2 x daily - 7 x weekly - 3 reps - 30 sec hold - Seated Quadratus Lumborum Stretch in Chair  - 2 x daily - 7 x weekly - 3 reps - 30 sec hold - Seated Flexion Stretch with Swiss Ball  - 2 x daily - 7 x weekly - 3 reps - 30 sec hold - Seated Thoracic Flexion and Rotation with Swiss Ball  - 2 x daily - 7 x weekly - 3 reps - 30 sec hold  Patient Education - Tips to reduce freezing episodes with standing or walking  PWR! Moves: - Supine - Seated - Standing   ASSESSMENT:  CLINICAL IMPRESSION: Continued gait  training with hiking poles, introducing B poles to further promote reciprocal arm motions. Pt initially requiring facilitation with PT to coordinate initiation of reciprocal pattern but able to continue pattern and demonstrate increased step length as PT stepped to the side. Still more difficulty noted when attempting to independently initiate pattern with B hiking poles, however better reciprocal sequencing of SPC with gait observed today.  Assessed gait on outdoor surfaces including grass, sidewalks and pavement on level surfaces and inclines as well as up/down curb - no LOB however patient visibly more cautious on uneven surfaces and intermittent cues necessary for improved foot clearance and heel strike.  Session concluded with discussion with Antwoin and his wife regarding concerns related to mobility at home or out in community.  His wife notes he seems to have more difficulty with getting in and out of bed, especially with sit to stand from bed, as well as navigating in tighter  spaces such as walking between his recliner and the ottoman.  Encouraged both of them to try to note any additional areas of concern related to mobility over the weekend to help identify need for potential recert versus readiness to transition to HEP next visit in addition to considering results of remaining standardized balance/goal testing.    OBJECTIVE IMPAIRMENTS: Abnormal gait, decreased activity tolerance, decreased balance, decreased knowledge of condition, decreased knowledge of use of DME, decreased mobility, difficulty walking, decreased ROM, decreased strength, decreased safety awareness, increased fascial restrictions, impaired perceived functional ability, impaired flexibility, improper body mechanics, and postural dysfunction.   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   REHAB POTENTIAL: Good  CLINICAL DECISION MAKING: Evolving/moderate complexity  EVALUATION COMPLEXITY: Moderate   GOALS: Goals reviewed with patient? Yes  SHORT TERM GOALS: Target date: 10/23/2022  Patient will be independent with initial HEP. Baseline: TBD Goal status: MET  10/18/22   2.  Patient will demonstrate appropriate sequencing of SPC or LRAD with gait for improved balance and decrease fall risk. Baseline:  Goal status: PARTIALLY MET  10/18/22 - not yet attempted as majority of gait during therapy sessions have been completed without AD; 11/06/22 - patient tending  to leave cane in waiting room during therapy sessions, therefore not yet assessed; 11/13/22 - decreased reciprocal movement pattern with [facility] and/or single hiking/walking pole; 11/15/22 - better reciprocal sequencing with with SPC, however still limited with B's hiking poles  3.  Patient will verbalize tips to reduce freezing/festination with gait and turns. Baseline:  Goal status: MET  10/18/22  LONG TERM GOALS: Target date: 11/20/2022  Patient will be independent with ongoing/advanced HEP for self-management at home incorporating PWR! Moves as indicated .  Baseline:  Goal status: IN PROGRESS  2.  Patient will be able to ambulate 600' with or w/o LRAD with good safety to access community.  Baseline:  Goal status: PARTIALLY MET  11/15/22  3.  Patient will be able to step up/down curb safely with or w/o LRAD for safety with community ambulation.  Baseline:  Goal status: PARTIALLY MET  11/15/22 - curb ascent/descent with SPC with modified independence - pt demonstrating  slight posterior lean but no LOB observed   4.  Patient will demonstrate gait speed of >/= 3.9 ft/sec (1.2 m/s) to be a safe community ambulator including ability to safely cross the street.  Baseline: 2.96 ft/sec w/o AD, 3.01 ft/sec with SPC Goal status: IN PROGRESS  11/15/22 - gait speed essentially unchanged - 3.09 ft/sec w/o AD, 2.83 ft/sec with SPC, 2.82 ft/sec with B hiking poles; 3.52 ft/sec (fastest comfortable speed w/o AD)  5.  Patient will improve 5x STS time to </= 16 seconds w/o need for UE assist to demonstrate improved functional strength and transfer efficiency. Baseline: 13.03 sec with B UE assist, unable to achieve full stand w/o UE assist Goal status: MET  11/15/22 - 13.88 sec w/o UE assist (occasional hands on knees)  6.  Patient will demonstrate at least 19/30 on FGA to improve gait stability and reduce risk for falls. (MCID = 4 points) Baseline: 15/30 Goal status: IN PROGRESS  7.  Patient will improve Berg  score by at least 8 points or to >/= 52/56 to improve safety and stability with ADLs in standing and reduce risk for falls. (MCID = 8 points)   Baseline: TBD Goal status: IN PROGRESS  8.  Patient will report >/= 75% on ABC scale to demonstrate improved balance confidence and decreased risk for falls. Baseline: 1060 / 1600 = 66.3 % Goal status: IN PROGRESS  9. Patient will verbalize understanding of local Parkinson's disease community resources, including community fitness post d/c. Baseline:  Goal status: IN PROGRESS   PLAN:  PT FREQUENCY: 2x/week  PT DURATION: 6-8 weeks (POC for 8 weeks due to patient on vacation x 2 weeks)  PLANNED INTERVENTIONS: Therapeutic exercises, Therapeutic activity, Neuromuscular re-education, Balance training, Gait training, Patient/Family education, Self Care, Joint mobilization, Stair training, DME instructions, Aquatic Therapy, Electrical stimulation, Cryotherapy, Moist heat, Taping, Ultrasound, Manual therapy, and Re-evaluation  PLAN FOR NEXT SESSION: Recert vs transition to HEP +/- 30-Walker hold; gait training with SPC and/or single or bilateral hiking/walking poles focusing on upright posture, reciprocal gait pattern and good foot clearance; progress lumbopelvic flexibility and strengthening - review and update HEP PRN; progress PWR! Moves as appropriate; review tips to address freezing with gait PRN   Marry Guan, PT 11/15/2022, 2:24 PM

## 2022-11-20 ENCOUNTER — Encounter: Payer: Self-pay | Admitting: Psychology

## 2022-11-20 ENCOUNTER — Ambulatory Visit: Payer: Medicare Other | Admitting: Physical Therapy

## 2022-11-20 ENCOUNTER — Ambulatory Visit (INDEPENDENT_AMBULATORY_CARE_PROVIDER_SITE_OTHER): Payer: Medicare Other | Admitting: Psychology

## 2022-11-20 ENCOUNTER — Ambulatory Visit: Payer: Medicare Other | Admitting: Psychology

## 2022-11-20 ENCOUNTER — Encounter: Payer: Self-pay | Admitting: Physical Therapy

## 2022-11-20 DIAGNOSIS — F0280 Dementia in other diseases classified elsewhere without behavioral disturbance: Secondary | ICD-10-CM

## 2022-11-20 DIAGNOSIS — M6281 Muscle weakness (generalized): Secondary | ICD-10-CM

## 2022-11-20 DIAGNOSIS — R2689 Other abnormalities of gait and mobility: Secondary | ICD-10-CM | POA: Diagnosis not present

## 2022-11-20 DIAGNOSIS — R4189 Other symptoms and signs involving cognitive functions and awareness: Secondary | ICD-10-CM

## 2022-11-20 DIAGNOSIS — R2681 Unsteadiness on feet: Secondary | ICD-10-CM

## 2022-11-20 DIAGNOSIS — F02A3 Dementia in other diseases classified elsewhere, mild, with mood disturbance: Secondary | ICD-10-CM | POA: Diagnosis not present

## 2022-11-20 DIAGNOSIS — G20A1 Parkinson's disease without dyskinesia, without mention of fluctuations: Secondary | ICD-10-CM | POA: Diagnosis not present

## 2022-11-20 DIAGNOSIS — Z9181 History of falling: Secondary | ICD-10-CM

## 2022-11-20 HISTORY — DX: Dementia in other diseases classified elsewhere, unspecified severity, without behavioral disturbance, psychotic disturbance, mood disturbance, and anxiety: F02.80

## 2022-11-20 NOTE — Progress Notes (Signed)
   Psychometrician Note   Cognitive testing was administered to Vincent Walker by Wallace Keller, B.S. (psychometrist) under the supervision of Dr. Newman Nickels, Ph.D., licensed psychologist on 11/20/2022. Vincent Walker did not appear overtly distressed by the testing session per behavioral observation or responses across self-report questionnaires. Rest breaks were offered.    The battery of tests administered was selected by Dr. Newman Nickels, Ph.D. with consideration to Vincent Walker current level of functioning, the nature of his symptoms, emotional and behavioral responses during interview, level of literacy, observed level of motivation/effort, and the nature of the referral question. This battery was communicated to the psychometrist. Communication between Dr. Newman Nickels, Ph.D. and the psychometrist was ongoing throughout the evaluation and Dr. Newman Nickels, Ph.D. was immediately accessible at all times. Dr. Newman Nickels, Ph.D. provided supervision to the psychometrist on the date of this service to the extent necessary to assure the quality of all services provided.    Vincent Walker will return within approximately 1-2 weeks for an interactive feedback session with Dr. Milbert Coulter at which time his test performances, clinical impressions, and treatment recommendations will be reviewed in detail. Vincent Walker understands he can contact our office should he require our assistance before this time.  A total of 120 minutes of billable time were spent face-to-face with Vincent Walker by the psychometrist. This includes both test administration and scoring time. Billing for these services is reflected in the clinical report generated by Dr. Newman Nickels, Ph.D.  This note reflects time spent with the psychometrician and does not include test scores or any clinical interpretations made by Dr. Milbert Coulter. The full report will follow in a separate note.

## 2022-11-20 NOTE — Therapy (Signed)
OUTPATIENT PHYSICAL THERAPY TREATMENT / DISCHARGE SUMMARY   Patient Name: Vincent Walker MRN: [id number] DOB:11/21/1945, 77 y.o., male Today's Date: 11/20/2022   END OF SESSION:  PT End of Session - 11/20/22 1317     Visit Number 9    Date for PT Re-Evaluation 11/20/22    Authorization Type Medicare & Federal BCBS    PT Start Time 1317    PT Stop Time 1401    PT Time Calculation (min) 44 min    Activity Tolerance Patient tolerated treatment well    Behavior During Therapy [facility] for tasks assessed/performed;Flat affect                Past Medical History:  Diagnosis Date   Adenomatous polyp of ascending colon 10/09/2018   Allergies 07/07/2018   Arthritis    Astigmatism of both eyes with presbyopia 08/21/2022   Blepharitis of upper and lower eyelids of both eyes 01/10/2016   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 Required nasal packing a couple of days ago.  Had a similar episode of epistaxis a little over a year ago.  At that visit, I could not see the exact bleeding spot. EXAMINATION after packing removal reveals several excoriated areas anteriorly but I was unable to see t   Essential hypertension 12/25/2011   Eustachian tube dysfunction, left 02/21/2021   Flank pain 07/19/2022   History of blood transfusion 2010   After Kidney surgery   History of chicken pox    History of renal cell carcinoma 02/08/2015   diagnosed and removed in 2010 Right Monitored by Dr Gaynelle Arabian of urology at [facility] Dr Karin Lieu, nephrology at [facility]   Hyperglycemia 12/23/2015   Increased thyroid stimulating hormone (TSH) level 06/07/2017   Ingrown left big toenail 02/14/2015   Left foot  pain 02/14/2015   MVP (mitral valve prolapse) 05/14/2016   Parkinson's disease 12/23/2015   Posterior vitreous detachment of both eyes 08/21/2022   Primary open-angle glaucoma, left eye, severe stage 05/05/2021   Primary open-angle glaucoma, right eye, moderate stage 01/10/2016   Pseudophakia, both eyes 08/21/2022   Right hip pain 12/12/2016   Stage 3 chronic kidney disease 12/25/2011   Thrombocytopenia 02/08/2015   Tremor 02/14/2015   Past Surgical History:  Procedure Laterality Date   APPENDECTOMY  1995   CATARACT EXTRACTION, BILATERAL     COLONOSCOPY     COLONOSCOPY WITH PROPOFOL N/A 12/17/2018   Procedure: COLONOSCOPY WITH PROPOFOL;  Surgeon: Lemar Lofty., MD;  Location: [facility] ENDOSCOPY;  Service: Gastroenterology;  Laterality: N/A;   ENDOSCOPIC MUCOSAL RESECTION N/A 12/17/2018   Procedure: ENDOSCOPIC MUCOSAL RESECTION;  Surgeon: Meridee Score Netty Starring., MD;  Location: [facility] ENDOSCOPY;  Service: Gastroenterology;  Laterality: N/A;   HEMOSTASIS CLIP PLACEMENT  12/17/2018   Procedure: HEMOSTASIS CLIP PLACEMENT;  Surgeon: Lemar Lofty., MD;  Location: [facility] ENDOSCOPY;  Service: Gastroenterology;;   left knee scope  2003   POLYPECTOMY  12/17/2018   Procedure: POLYPECTOMY;  Surgeon: Meridee Score Netty Starring., MD;  Location: [facility] ENDOSCOPY;  Service: Gastroenterology;;   right knee scope  1999   SUBMUCOSAL LIFTING INJECTION  12/17/2018   Procedure: SUBMUCOSAL LIFTING INJECTION;  Surgeon: Lemar Lofty., MD;  Location: [facility] ENDOSCOPY;  Service: Gastroenterology;;   TOE SURGERY Left    metal 2nd toe- and top of foot- straighten bone   TONSILLECTOMY     TOTAL KNEE ARTHROPLASTY Left 07/06/2014   TOTAL NEPHRECTOMY Right    Patient Active Problem List   Diagnosis Date Noted   Dementia due to Parkinson's disease 11/20/2022   Dry eye syndrome of both eyes 08/28/2022   Astigmatism of both eyes with presbyopia 08/21/2022   Choroidal nevus of right eye 08/21/2022   Posterior  vitreous detachment of both eyes 08/21/2022   Pseudophakia, both eyes 08/21/2022   Primary open-angle glaucoma, left eye, severe stage 05/05/2021   Eustachian tube dysfunction, left 02/21/2021   Early dry stage nonexudative age-related macular degeneration of both eyes 03/09/2019   Adenomatous polyp of ascending colon 10/09/2018   Epistaxis 07/07/2018   Allergies 07/07/2018   Obesity 06/15/2018   Increased thyroid stimulating hormone (TSH) level 06/07/2017   MVP (mitral valve prolapse) 05/14/2016   Blepharitis of upper and lower eyelids of both eyes 01/10/2016   Primary open-angle glaucoma, right eye, moderate stage 01/10/2016   Parkinson's disease 12/23/2015   Hyperglycemia 12/23/2015   Tremor 02/14/2015   Dyslipidemia 02/08/2015   History of renal cell carcinoma 02/08/2015   Thrombocytopenia 02/08/2015   History of chicken pox    Essential hypertension 12/25/2011   Stage 3 chronic kidney disease 12/25/2011    PCP: Bradd Canary, MD   REFERRING PROVIDER: Vladimir Faster, DO   REFERRING DIAG: G20.A1 (ICD-10-CM) - Parkinson's disease without dyskinesia or fluctuating manifestations   THERAPY DIAG:  Other abnormalities of gait and mobility  Unsteadiness on feet  Muscle weakness (generalized)  History of falling  RATIONALE FOR EVALUATION AND TREATMENT: Rehabilitation  ONSET DATE: PD diagnosis 4+ years ago   NEXT MD VISIT: 02/25/23   SUBJECTIVE:                                                                                                                                                                                                         SUBJECTIVE STATEMENT: Pt reports he is tired after spending the morning completing cognitive testing.  PAIN: Are you having pain? 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 Yes. Number of falls 1  LIVING ENVIRONMENT: Lives with: lives with their spouse Lives  in: House/apartment Stairs: No Has following equipment at home: Single point cane, Walker - 2 wheeled, Grab bars, and Recumbent Bike  OCCUPATION: Retired  PLOF: Independent and Leisure: mostly sedentary recently (previously he had been completing HEP exercises from prior PT episode + some other exercises, bike for 20-30 min/day, work with [employer])     PATIENT GOALS: "To get going again."   OBJECTIVE: (objective measures completed at initial evaluation unless otherwise dated)  DIAGNOSTIC FINDINGS:  07/19/22 - R hip and pelvis x-ray: FINDINGS: Mildly decreased bone mineralization. Mild bilateral sacroiliac subchondral sclerosis. The pubic symphysis joint space is maintained.   Mild bilateral superior femoroacetabular joint space narrowing. Mild-to-moderate right and mild left superolateral acetabular degenerative osteophytes. Normal morphology of the right femoral head-neck junction without CAM-type bump deformity. No acute fracture or dislocation.   Mild atherosclerotic calcifications. Multiple vascular phleboliths again overlie the pelvis.   IMPRESSION: 1. No acute fracture. 2. 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  COGNITION: Overall cognitive status: Impaired - decreased short-term memory and recall of new information   SENSATION: WFL  COORDINATION: B heel-toe mildly diminished. Impaired heel to shin bilaterally.  EDEMA:  N/A  MUSCLE TONE: Increased LE muscle tone noted during attempts at PROM/flexibility assessment.  MUSCLE LENGTH: Hamstrings: mod tight B ITB: mild tight B Piriformis: mod tight B Hip flexors: mod tight B Quads: mild tight B Heelcord: mod tight R, mild tight L  POSTURE:  rounded shoulders, forward head,  decreased lumbar lordosis, increased thoracic kyphosis, flexed trunk , and weight shift right  LOWER EXTREMITY ROM:    Mildly limited B hip and ankle ROM (R>L), mostly due to limited muscle flexibility as above.  LOWER EXTREMITY MMT:    MMT Right Eval Left Eval  Hip flexion 4+ 4+  Hip extension 4 4+  Hip abduction 4 4+  Hip adduction 4+ 4+  Hip internal rotation 5 5  Hip external rotation 4+ 4+  Knee flexion 4+ 4+  Knee extension 4+ 4+  Ankle dorsiflexion 4+ 4+  Ankle plantarflexion 4 4  Ankle inversion    Ankle eversion    (Blank rows = not tested)  BED MOBILITY:  Supine to/from sit and rolling with SBA/CGA as well as min assist to lift leg onto treatment table  TRANSFERS: Assistive device utilized: Single point cane and None  Sit to stand: Modified independence Stand to sit: Modified independence Chair to chair: Modified independence Floor:  NT  GAIT: Distance walked: 120 ft Assistive device utilized: Single point cane and None Level of assistance: SBA Gait pattern: decreased arm swing- Right, decreased arm swing- Left, decreased stride length, decreased hip/knee flexion- Right, decreased hip/knee flexion- Left, decreased trunk rotation, trunk flexed, poor foot clearance- Right, and poor foot clearance- Left Comments: Stooped posture with decreased reciprocal movement patterns and decreased stride length along with poor sequencing of cane  RAMP: Level of Assistance:  NT Assistive device utilized:  NT Ramp Comments:   CURB:  Level of Assistance:  NT Assistive device utilized:  NT Curb Comments:   STAIRS:  Level of Assistance: SBA  Stair Negotiation Technique: Alternating Pattern  with Single Rail on Right  Number of Stairs: 14   Height of Stairs: 7"  Comments: Patient is very hesitant on stairs with 2 hands on rail for most of ascent and descent  FUNCTIONAL TESTS:  5 times sit to stand: 13.03 sec with B UE assist, unable to achieve full stand w/o UE  assist Timed up and go (TUG): Normal =  11.06 sec, Manual = 12.09 sec, Cognitive = 14.56 sec 10 meter walk test: 11.07 sec w/o AD, 10.88 sec with SPC; Gait speed = 2.96 ft/sec w/o AD, 3.01 ft/sec with SPC Berg Balance Scale: 43/56, 37-45 significant risk for falls (>80%) Functional gait assessment: 15/30; < 19 = high risk fall   11/15/22: 5xSTS = 13.88 sec w/o UE assist (occasional hands on knees) TUG: Normal = 9.93 sec, Manual = 10.19 sec, Cognitive = 13.16 sec (unable to complete cognitive task) w/o AD = 10.62 sec, 9.32 sec (fastest comfortable speed w/o AD) with SPC = 11.59 sec with B hiking poles = 11.62 sec Gait speed = 3.09 ft/sec w/o AD, 2.83 ft/sec with SPC, 2.82 ft/sec with B hiking poles; 3.52 ft/sec (fastest comfortable speed w/o AD)  11/20/22: Berg = 50/56, 46-51 moderate risk for falls (>50%)  FGA = 21/30, 19-24 = medium risk for fall   Baseline as of 04/10/22 PT PD Screen: 5xSTS: 15.90 sec - pt notes limited by knees TUG: 10.37 sec (Normal), 12.16 sec (Manual), 12.93 sec (Cognitive) : 9.75 sec Gait speed = 3.36 ft/sec FGA = 20/30; 19-24 = medium risk fall   Baseline as of 09/26/21 PT PD Screen: 5xSTS: 12.47 sec TUG: 7.91 sec (Normal), 8.87 sec (Manual), 10.81 sec (Cognitive) : 7.69 sec  Gait speed = 4.27 ft/sec  Baseline as of D/C from last PT episode (04/06/21): 5xSTS: 12.65 sec TUG: 8.47 sec (Normal), 10.38 sec (Manual), 10.66 (Cognitive) : 6.66 sec Gait speed = 4.92 ft/sec  Berg: 53/56; 52-55 = lower risk for falls (> 25%)  FGA = 25/30; 25-28 = low risk fall   PATIENT SURVEYS:  ABC scale 1060 / 1600 = 66.3 %   TODAY'S TREATMENT:   11/20/22 THERAPEUTIC EXERCISE: to improve flexibility, strength and mobility.  Demonstration, verbal and tactile cues throughout for technique.  Rec Bike - L3 x 6 min  THERAPEUTIC ACTIVITIES: Berg = 50/56 FGA = 21/30 Goal assessment   11/15/22 THERAPEUTIC EXERCISE: to improve flexibility,  strength and mobility.  Demonstration, verbal and tactile cues throughout for technique.  NuStep - L6 x 6 min (UE/LE to promote improved reciprocal movement patterns)  GAIT TRAINING: To normalize gait pattern and improve safety with SPC vs hiking poles .  450 feet with B hiking poles in clinic - verbal and tactile cues for reciprocal step pattern increased reciprocal arm swing, as well as VC for increased hip and knee flexion targeting heel strike on weight acceptance for increased foot clearance 250 feet each with SPC and B hiking poles on outdoor surfaces including sidewalk, pavement, grass, and inclines - intermittent verbal cues necessary for reciprocal pattern (esp with hiking poles) as well as heel strike to increase stride length and foot clearance  Stairs: Level of Assistance: Modified independence and SBA Stair Negotiation Technique: Alternating Pattern  Forwards With use of AD: SPC  with Single Rail on Left Number of Stairs: 2 x 14 (descent only)  Height of Stairs: 7"  Comments: pt with tendency to lean back while descending stairs or curb  THERAPEUTIC ACTIVITIES: Sit to stand utilizing PWR! Up for facilitation of fwd weight shift x 10 5xSTS = 13.88 sec w/o UE assist (occasional hands on knees) TUG: Normal = 9.93 sec, Manual = 10.19 sec, Cognitive = 13.16 sec (unable to complete cognitive task) w/o AD = 10.62 sec, 9.32 sec (fastest comfortable speed w/o AD) with SPC = 11.59 sec with B hiking poles = 11.62 sec Gait  speed = 3.09 ft/sec w/o AD, 2.83 ft/sec with SPC, 2.82 ft/sec with B hiking poles; 3.52 ft/sec (fastest comfortable speed w/o AD)   11/13/22 THERAPEUTIC EXERCISE: to improve flexibility, strength and mobility.  Demonstration, verbal and tactile cues throughout for technique.  NuStep - L6 x 6 min (UE/LE to promote improved reciprocal movement patterns)  MANUAL THERAPY: To promote normalized muscle tension, improved flexibility, improved joint mobility,  reduced edema, and reduced sensation of tightness .  Performed edema management techniques to L LE in attempt to reduce feeling of swelling/tightness in his L knee - reduction in pitting edema noted but patient still feeling tight in his knee STM/DTM and manual TPR to left quads Manual stretching to L quads and prone with pillow under abdomen/hips  NEUROMUSCULAR RE-EDUCATION: To improve posture, balance, coordination, reduce fall risk, amplitude of movement, speed of movement to reduce bradykinesia, and reduce rigidity. Prone PWR! Up, Rock, Twist & Step x 10 - pillows under torso and abdomen/hips for support and offloading of low back, however still with limited tolerance  GAIT TRAINING: To normalize gait pattern and improve safety with SPC versus hiking poles . 270 feet with SPC in R hand - hand initially adjacent to R hip for all steps, therefore provided cues for reciprocal step pattern coordinating cane with movement of L foot as we use for increased hip and knee flexion with heel strike on weight acceptance to reduce shuffling gait 270 feet with single hiking pole in R hand - continued cues for reciprocal step pattern increased reciprocal arm swing - slightly better pattern than with SPC but continued practice indicated   PATIENT EDUCATION:  Education details: recommended frequency for ongoing HEP at discharge to prevent loss of gains achieved with PT  Person educated: Patient Education method: Explanation Education comprehension: verbalized understanding  HOME EXERCISE PROGRAM: Access Code: [email or web address] URL: [email or web address] Date: 11/06/2022 Prepared by: Glenetta Hew  Exercises - Supine Lower Trunk Rotation  - 2 x daily - 7 x weekly - 2 sets - 10 reps - 10 sec hold - Seated Thoracic Lumbar Extension with Pectoralis Stretch  - 2 x daily - 7 x weekly - 2 sets - 10 reps - 5 sec hold - Seated Hamstring Stretch  - 2 x daily - 7 x weekly - 3 reps - 30 sec hold - Seated  Figure 4 Piriformis Stretch  - 2 x daily - 7 x weekly - 3 reps - 30 sec hold - Seated Piriformis Stretch  - 2 x daily - 7 x weekly - 3 reps - 30 sec hold - Seated Hip Flexor Stretch  - 2 x daily - 7 x weekly - 3 reps - 30 sec hold - Standing Gastroc Stretch at Counter  - 2 x daily - 7 x weekly - 3 reps - 30 sec hold - Seated Quadratus Lumborum Stretch in Chair  - 2 x daily - 7 x weekly - 3 reps - 30 sec hold - Seated Flexion Stretch with Swiss Ball  - 2 x daily - 7 x weekly - 3 reps - 30 sec hold - Seated Thoracic Flexion and Rotation with Swiss Ball  - 2 x daily - 7 x weekly - 3 reps - 30 sec hold  Patient Education - Tips to reduce freezing episodes with standing or walking  PWR! Moves: - Supine - Seated - Standing   ASSESSMENT:  CLINICAL IMPRESSION: Eldin has demonstrated good progress with PT with gains noted across the majority of his standardized balance  tests. Berg score of 50/56 and FGA score of 21/30 now indicating a reduction in fall risk to medium/moderate risk for falls while all versions of TUG below the fall risk threshold. He reports good comfort and understanding of HEP and PWR! Moves in supine, sitting, and standing (prone and quadruped deferred due to positional discomfort) and denies need for further review.  He reports his wife has ordered hiking/walking poles for him to carryover with facilitation of upright posture during gait. Majority of goals now met and Saddam feels ready to transition to his HEP feeling that he does better setting his own schedule for exercise vs having to conform to a class or appointment schedule, therefore will proceed with discharge from physical therapy for this episode.  Will plan for f/u with 5-month Parkinson's screen.  OBJECTIVE IMPAIRMENTS: Abnormal gait, decreased activity tolerance, decreased balance, decreased knowledge of condition, decreased knowledge of use of DME, decreased mobility, difficulty walking, decreased ROM, decreased  strength, decreased safety awareness, increased fascial restrictions, impaired perceived functional ability, impaired flexibility, improper body mechanics, and postural dysfunction.   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   REHAB POTENTIAL: Good  CLINICAL DECISION MAKING: Evolving/moderate complexity  EVALUATION COMPLEXITY: Moderate   GOALS: Goals reviewed with patient? Yes  SHORT TERM GOALS: Target date: 10/23/2022  Patient will be independent with initial HEP. Baseline: TBD Goal status: MET  10/18/22   2.  Patient will demonstrate appropriate sequencing of SPC or LRAD with gait for improved balance and decrease fall risk. Baseline:  Goal status:  MET  10/18/22 - not yet attempted as majority of gait during therapy sessions have been completed without AD; 11/06/22 - patient tending to leave cane in waiting room during therapy sessions, therefore not yet assessed; 11/13/22 - decreased reciprocal movement pattern with [facility] and/or single hiking/walking pole; 11/15/22 - better reciprocal sequencing with with SPC, however still limited with B's hiking poles  3.  Patient will verbalize tips to reduce freezing/festination with gait and turns. Baseline:  Goal status: MET  10/18/22  LONG TERM GOALS: Target date: 11/20/2022  Patient will be independent with ongoing/advanced HEP for self-management at home incorporating PWR! Moves as indicated .  Baseline:   Goal status: MET  2.  Patient will be able to ambulate 600' with or w/o LRAD with good safety to access community.  Baseline:  Goal status: MET  11/15/22  3.  Patient will be able to step up/down curb safely with or w/o LRAD for safety with community ambulation.  Baseline:  Goal status: PARTIALLY MET  11/15/22 - curb ascent/descent with SPC with modified independence - pt demonstrating slight posterior lean but no LOB observed   4.  Patient will demonstrate gait speed of >/= 3.9 ft/sec (1.2 m/s) to be a safe community ambulator including ability to safely cross the street.  Baseline: 2.96 ft/sec w/o AD, 3.01 ft/sec with SPC Goal status: NOT MET  11/15/22 - gait speed essentially unchanged - 3.09 ft/sec w/o AD, 2.83 ft/sec with SPC, 2.82 ft/sec with B hiking poles; 3.52 ft/sec (fastest comfortable speed w/o AD)  5.  Patient will improve 5x STS time to </= 16 seconds w/o need for UE assist to demonstrate improved functional strength and transfer efficiency. Baseline: 13.03 sec with B UE assist, unable to achieve full stand w/o UE assist Goal status: MET  11/15/22 - 13.88 sec w/o UE assist (occasional hands on knees)  6.  Patient will demonstrate at least 19/30 on FGA to improve gait stability and reduce risk for falls. (MCID = 4 points) Baseline: 15/30 Goal status: MET  11/20/22 - 21/30  7.  Patient will improve Berg score by at least 8 points or to >/= 52/56 to improve safety and stability with ADLs in standing and reduce risk for falls. (MCID = 8 points)   Baseline: TBD Goal status: PARTIALLY MET  11/20/22 - 50/56 (7 point improvement - pt happy with this)  8.  Patient will report >/= 75% on ABC scale to demonstrate improved balance confidence and decreased risk for falls. Baseline: 1060 / 1600 = 66.3 % Goal status: MET  11/20/22 - 1430 / 1600 = 89.4 %  9. Patient will verbalize understanding of local Parkinson's disease community resources, including community fitness post d/c. Baseline:   Goal status: MET  11/20/22   PLAN:  PT FREQUENCY: 2x/week  PT DURATION: 6-8 weeks (POC for 8 weeks due to patient on vacation x 2 weeks)  PLANNED INTERVENTIONS: Therapeutic exercises, Therapeutic activity, Neuromuscular re-education, Balance training, Gait training, Patient/Family education, Self Care, Joint mobilization, Stair training, DME instructions, Aquatic Therapy, Electrical stimulation, Cryotherapy, Moist heat, Taping, Ultrasound, Manual therapy, and Re-evaluation  PLAN FOR NEXT SESSION: transition to HEP + D/C from PT; 41-month PD screen   PHYSICAL THERAPY DISCHARGE SUMMARY  Visits from Start of Care: 9  Current functional level related to goals / functional outcomes: Refer to above clinical impression and goal assessment.   Remaining deficits: As above. Walking speed not significantly increased but remains at a community ambulator level.   Education / Equipment: HEP, PWR! Moves, fall prevention education   Patient agrees to discharge. Patient goals were partially met. Patient is being discharged due to being pleased with the current functional level.   Marry Guan, PT 11/20/2022, 5:03 PM

## 2022-11-20 NOTE — Progress Notes (Signed)
NEUROPSYCHOLOGICAL EVALUATION [place] [place] Department of Neurology  Date of Evaluation: November 20, 2022  Reason for Referral:   HAROL MASONER is a 77 y.o. right-handed Caucasian male referred by Kerin Salen, D.O., to characterize his current cognitive functioning and assist with diagnostic clarity and treatment planning in the context of Parkinson's disease and concern for progressive cognitive decline.   Assessment and Plan:   Clinical Impression(s): Mr. Slover' pattern of performance is suggestive of primary deficits surrounding processing speed, executive functioning, verbal fluency, visuospatial abilities, and essentially all aspects of learning and memory. Performances were appropriate relative to age-matched peers across basic attention and confrontation naming. Functionally, Mr. Bonno' wife has fully taken over medication management and he no longer drives due to a combination of cognitive decline and visual acuity concerns. She also manages finances and bill paying. While this is longstanding in nature, she further commented her perception that he would be unable to perform these actions currently. Given the combination of cognitive and functional impairment, I believe that Mr. 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  Given increased fatigue which necessitated the abbreviation of the current evaluation, not all previously completed tasks were able to be replicated. With this caveat, relative to his previous evaluation in July 2022, objective decline was exhibited across all assessed domains with the exception of confrontation naming (which exhibited stability). Decline was mild in nearly all cases. Visuospatial abilities likely saw the greatest negative change across cognitive domains. No domains saw improvement.   Regarding etiology, it was previously theorized that his  history of Parkinson's disease represented the most likely culprit for cognitive impairment. I continue to feel that Parkinson's disease best represents the cause for his current dementia presentation. His pattern of deficits continue to align well with expected patterns commonly seen across this illness. Despite the report of some REM sleep behaviors, I still do not see compelling evidence to suggest a Lewy body dementia presentation being more likely. While day-to-day dysfunction may be exacerbated by mild levels of psychiatric distress, this unfortunately cannot account for current testing performances, nor progressive decline over time.   Recommendations: A combination of medication and psychotherapy has been shown to be most effective at treating symptoms of anxiety and depression. As such, Mr. Faye is encouraged to speak with his prescribing physician regarding medication adjustments to optimally manage these symptoms. Likewise, Mr. Kimmey could consider engaging in short-term psychotherapy to address symptoms of psychiatric distress. He would benefit from an active and collaborative therapeutic environment, rather than one purely supportive in nature. Recommended treatment modalities include Cognitive Behavioral Therapy (CBT) or Acceptance and Commitment Therapy (ACT).  I agree with he and his wife's decision to have him fully abstain from all driving pursuits based upon current testing, as well as reported visual acuity concerns. Should his family wish to pursue a formalized driving evaluation, they could reach out to the following agencies: [employer] in [place]: [phone] [facility]: [phone] [facility]: [phone] [facility]: [phone] or [phone]  It will be important for Mr. Kurman to have another person with him when in situations where he may need to process information, weigh the pros and cons of different options, and  make decisions, in order to ensure that he fully understands and recalls all information to be considered.  If not already done, Mr. Ledgerwood and his family may want to discuss his wishes  regarding durable power of attorney and medical decision making, so that he can have input into these choices. If they require legal assistance with this, long-term care resource access, or other aspects of estate planning, they could reach out to [employer] at [phone] for a free consultation. Additionally, they may wish to discuss future plans for caretaking and seek out community options for in home/residential care should they become necessary.  Mr. Milner is encouraged to attend to lifestyle factors for brain health (e.g., regular physical exercise, good nutrition habits and consideration of the MIND-DASH diet, regular participation in cognitively-stimulating activities, and general stress management techniques), which are likely to have benefits for both emotional adjustment and cognition. In fact, in addition to promoting good general health, regular exercise incorporating aerobic activities (e.g., brisk walking, jogging, cycling, etc.) has been demonstrated to be a very effective treatment for depression and stress, with similar efficacy rates to both antidepressant medication and psychotherapy. Optimal control of vascular risk factors (including safe cardiovascular exercise and adherence to dietary recommendations) is encouraged. Continued participation in activities which provide mental stimulation and social interaction is also recommended.   Memory can be improved using internal strategies such as rehearsal, repetition, chunking, mnemonics, association, and imagery. External strategies such as written notes in a consistently used memory journal, visual and nonverbal auditory cues such as a calendar on the refrigerator or appointments with alarm, such as on a cell phone, can also help maximize recall.     When learning new information, he would benefit from information being broken up into small, manageable pieces. he may also find it helpful to articulate the material in his own words and in a context to promote encoding at the onset of a new task. This material may need to be repeated multiple times to promote encoding.  Because he shows better recall for structured information, he will likely understand and retain new information better if it is presented to him in a meaningful or well-organized manner at the outset, such as grouping items into meaningful categories or presenting information in an outlined, bulleted, or story format.  To address problems with processing speed, he may wish to consider:   -Ensuring that he is alerted when essential material or instructions are being presented   -Adjusting the speed at which new information is presented   -Allowing for more time in comprehending, processing, and responding in conversation   -Repeating and paraphrasing instructions or conversations aloud  To address problems with fluctuating attention and/or executive dysfunction, he may wish to consider:   -Avoiding external distractions when needing to concentrate   -Limiting exposure to fast paced environments with multiple sensory demands   -Writing down complicated information and using checklists   -Attempting and completing one task at a time (i.e., no multi-tasking)   -Verbalizing aloud each step of a task to maintain focus   -Taking frequent breaks during the completion of steps/tasks to avoid fatigue   -Reducing the amount of information considered at one time   -Scheduling more difficult activities for a time of day where he is usually most alert  Review of Records:   Mr. Kleffner was seen by [facility] Lurena Joiner Tat, D.O.) on 07/28/2020 for follow-up of Parkinson's disease. Effectiveness of previous medication adjustments were discussed, with Mr. Cordy noting that he was doing  better taking his medications on time. He denied any recent falls, lightheadedness, or near syncope. He also denied any visual hallucinations. He reported sleeping better with the assistance of  medication adjustments. Records do suggest a history of REM sleep behaviors. While meeting with Dr. Arbutus Leas, he expressed some concerns surrounding short-term memory. He also expressed concerns surrounding driving and if driving restrictions were necessary. Per Dr. Don Perking most recent note, they "discussed [a] driving eval[uation] and he declined so it was recommend[ed] that he not drive at all and he agreed." Ultimately, Mr. Pettersen was referred for a comprehensive neuropsychological evaluation to characterize his cognitive abilities and to assist with diagnostic clarity and treatment planning.   He completed a comprehensive neuropsychological evaluation with myself on 10/21/2020. Results suggested an impairment surrounding processing speed, as well as performance variability across executive functioning. Performance was largely appropriate across domains of attention/concentration, receptive and expressive language, visuospatial abilities, and learning and memory. 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 Underlying Parkinson's disease was believed to be the most likely etiology for ongoing dysfunction. Repeat testing in 18-24 months was recommended.   He most recently met with Dr. Arbutus Leas on 08/16/2022 for follow-up. At that time, both Mr. Delozier and his wife reported their perception of progressive cognitive decline, especially surrounding processing speed and short-term memory. Ultimately, Mr. Shelburn was referred for a comprehensive neuropsychological evaluation to characterize his cognitive abilities and to assist with diagnostic clarity and treatment planning.  Neuroimaging:  Brain MRI on 11/30/2015 was largely unremarkable. Mild periventricular and subcortical small vessel  changes were noted but said to be age appropriate. No more recent neuroimaging was available for review.   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 Required nasal packing a couple of days ago.  Had a similar episode of epistaxis a little over a year ago.  At that visit, I could not see the exact bleeding spot. EXAMINATION after packing removal reveals several excoriated areas anteriorly but I was unable to see t   Essential hypertension 12/25/2011   Eustachian tube dysfunction, left 02/21/2021   Flank pain 07/19/2022   History of blood transfusion 2010   After Kidney surgery   History of chicken pox    History of renal cell carcinoma 02/08/2015   diagnosed and removed in 2010 Right Monitored by Dr Gaynelle Arabian of urology at [facility] Dr Karin Lieu, nephrology at [facility]   Hyperglycemia 12/23/2015   Increased thyroid stimulating hormone (TSH) level 06/07/2017   Ingrown left big toenail 02/14/2015   Left foot pain 02/14/2015   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   MVP (mitral valve prolapse) 05/14/2016   Parkinson's disease 12/23/2015   Posterior vitreous detachment of both eyes 08/21/2022   Primary open-angle glaucoma, left eye, severe stage 05/05/2021   Primary open-angle glaucoma, right eye, moderate stage 01/10/2016   Pseudophakia, both eyes 08/21/2022   Right hip pain 12/12/2016   Stage 3 chronic kidney disease 12/25/2011   Thrombocytopenia 02/08/2015   Tremor  02/14/2015    Past Surgical History:  Procedure Laterality Date   APPENDECTOMY  1995   CATARACT EXTRACTION, BILATERAL  COLONOSCOPY     COLONOSCOPY WITH PROPOFOL N/A 12/17/2018   Procedure: COLONOSCOPY WITH PROPOFOL;  Surgeon: Meridee Score Netty Starring., MD;  Location: [facility] ENDOSCOPY;  Service: Gastroenterology;  Laterality: N/A;   ENDOSCOPIC MUCOSAL RESECTION N/A 12/17/2018   Procedure: ENDOSCOPIC MUCOSAL RESECTION;  Surgeon: Meridee Score Netty Starring., MD;  Location: [facility] ENDOSCOPY;  Service: Gastroenterology;  Laterality: N/A;   HEMOSTASIS CLIP PLACEMENT  12/17/2018   Procedure: HEMOSTASIS CLIP PLACEMENT;  Surgeon: Lemar Lofty., MD;  Location: [facility] ENDOSCOPY;  Service: Gastroenterology;;   left knee scope  2003   POLYPECTOMY  12/17/2018   Procedure: POLYPECTOMY;  Surgeon: Lemar Lofty., MD;  Location: [facility] ENDOSCOPY;  Service: Gastroenterology;;   right knee scope  1999   SUBMUCOSAL LIFTING INJECTION  12/17/2018   Procedure: SUBMUCOSAL LIFTING INJECTION;  Surgeon: Lemar Lofty., MD;  Location: [facility] ENDOSCOPY;  Service: Gastroenterology;;   TOE SURGERY Left    metal 2nd toe- and top of foot- straighten bone   TONSILLECTOMY     TOTAL KNEE ARTHROPLASTY Left 07/06/2014   TOTAL NEPHRECTOMY Right     Current Outpatient Medications:    acetaminophen (TYLENOL) 500 MG tablet, Take 1,000 mg by mouth every 6 (six) hours as needed (pain.). , Disp: , Rfl:    atenolol (TENORMIN) 25 MG tablet, Take 1 tablet (25 mg total) by mouth every evening., Disp: 90 tablet, Rfl: 3   atorvastatin (LIPITOR) 20 MG tablet, Take 1 tablet (20 mg total) by mouth daily., Disp: 90 tablet, Rfl: 0   Brinzolamide-Brimonidine (SIMBRINZA) 1-0.2 % SUSP, Take 1 drop in both eyes 3 (three) times daily., Disp: 8 mL, Rfl: 11   carbidopa-levodopa (SINEMET CR) 50-200 MG tablet, Take 1 tablet by mouth at bedtime., Disp: 90 tablet, Rfl: 2   carbidopa-levodopa (SINEMET IR) 25-100 MG tablet, Take 2 tablets by mouth at 8am,  2 tabs at 11am, 2 tabs at 2pm, then 1 tab at 6pm as directed, Disp: 630 tablet, Rfl: 2   clonazePAM (KLONOPIN) 0.5 MG tablet, Take 1 tablet (0.5 mg total) by mouth at bedtime., Disp: 30 tablet, Rfl: 5   COVID-19 mRNA vaccine 2023-2024 (COMIRNATY) syringe, Inject into the muscle., Disp: 0.3 mL, Rfl: 0   divalproex (DEPAKOTE ER) 250 MG 24 hr tablet, Take 1 tablet (250 mg total) by mouth daily., Disp: 90 tablet, Rfl: 1   influenza vaccine adjuvanted (FLUAD QUADRIVALENT) 0.5 ML injection, Inject into the muscle., Disp: 0.5 mL, Rfl: 0   Levodopa (INBRIJA) 42 MG CAPS, TWO capsules is ONE dosage (never inhale just one capsule). You can inhale the capsules as needed up to 5 times per day, separated by 2 hour intervals., Disp: 300 capsule, Rfl: 5   mirtazapine (REMERON) 15 MG tablet, Take 1 tablet (15 mg total) by mouth at bedtime., Disp: 90 tablet, Rfl: 2   Multiple Vitamins-Minerals (PRESERVISION AREDS 2 PO), Take 1 tablet by mouth daily., Disp: , Rfl:    Polyethyl Glycol-Propyl Glycol (LUBRICANT EYE DROPS) 0.4-0.3 % SOLN, Place 1 drop into both eyes 3 (three) times daily as needed (dry/irritated eyes.)., Disp: , Rfl:    traMADol (ULTRAM) 50 MG tablet, Take 0.5-1 tablets (25-50 mg total) by mouth 3 (three) times daily as needed. (Patient taking differently: Take 25-50 mg by mouth as needed.), Disp: 15 tablet, Rfl: 0  Clinical Interview:   The following information was obtained during a clinical interview with Mr. Brisbane and his wife prior to cognitive testing.  Cognitive Symptoms: Decreased short-term memory: Endorsed. He previously reported trouble recalling the  details of conversations, as well as occasional difficulty recalling names. His wife was in agreement. She also added an example of him having trouble learning to work a new remote control. At the time of his previous July 2022 evaluation, he reported ongoing difficulties for the past several years. Both he and his wife noted that since this  evaluation, they have observed progressive memory decline.  Decreased long-term memory: Denied. Decreased attention/concentration: Endorsed. He previously noted that trouble with focus and sustained attention was largely interest dependant. If he has interest in a topic, he felt he was able to attend well. However, currently, he and his wife acknowledged more widespread trouble with sustained attention and notably increased distractibility.  Reduced processing speed: Endorsed. This represents a new endorsement relative to his previous evaluation.  Difficulties with executive functions: Denied. However, his wife reported prominent difficulties with multi-tasking which was new relative to his previous evaluation. They denied trouble with impulsivity or any significant personality changes.  Difficulties with emotion regulation: Denied. Difficulties with receptive language: Denied assuming he can hear the source of the sound adequately.  Difficulties with word finding: Endorsed "occasionally."  Decreased visuoperceptual ability: Denied.   Difficulties completing ADLs: Endorsed. Mr. Colglazier' wife has fully taken over all medication management responsibilities and also must provide regular reminders for him to take said medications. This was new relative to his previous evaluation where they reported medication independence. His wife manages finances and bill paying which is longstanding in nature. He has not driven for the past year or so. This was said to be due to a combination of poor processing speed/reaction time and diminished visual acuity. This also represents a change relative to his previous evaluation.   Additional Medical History: History of traumatic brain injury/concussion: Denied. They did previously describe a remote gardening accident where a heavy piece of equipment struck his head. While he required stiches, he did not lose consciousness and was not formally diagnosed with a concussion. No  other potential head injuries were reported.  History of stroke: Denied. History of seizure activity: Denied. History of known exposure to toxins: Denied. Symptoms of chronic pain: Endorsed. He previously reported somewhat diffuse pain, with his knees and hips being highlighted as particularly painful areas. This was stable.  Experience of frequent headaches/migraines: Denied. When rare headache symptoms are present, his wife previously noted that these are due to acute blood pressure elevations.  Frequent instances of dizziness/vertigo: Denied. He did acknowledge "sometimes" feeling lightheaded, generally in the morning.    Sensory changes: He wears glasses with general effectiveness. However, he does have numerous eye-related conditions which could reasonably compromise his vision (see above). He has hearing aids but, per his wife, often does not utilize them. Other sensory changes/difficulties (e.g., taste or smell) were denied.  Balance/coordination difficulties: Mr. Tuomi previously described his balance as "pretty good." He noted walking with a cane for assistance in emergency situations where he might fall over. He denied any recent falls. His most recent one was several months ago, due to him tripping over something on the ground. His wife did previously note that movement has slowed and that Mr. Aloisi generally performs various actions much more slowly. This was said to have progressively worsened.   Other motor difficulties: He reported a generally mild tremor predominantly in his right hand. Symptoms were said to less frequently involve his left hand. Symptoms have been present for several years and have seemed stable over time. However, he did describe current experiences where tremors  can also occur in his legs bilaterally. Mr. Briner did report that levodopa medications do result in an improvement in these symptoms.   Sleep History: Estimated hours obtained each night: 8 hours.   Difficulties falling asleep: Denied. Difficulties staying asleep: Endorsed. He reported commonly waking 3-4 times each night to use the restroom.  Feels rested and refreshed upon awakening: Denied. He reported generally awakening still "not completed" rested. His wife noted that he also sleeps a good amount during the day.    History of snoring: Denied. History of waking up gasping for air: Denied. Witnessed breath cessation while asleep: Denied.   History of vivid dreaming: Endorsed. Excessive movement while asleep: Denied. Instances of acting out his dreams: Endorsed. His wife previously reported that Mr. Boat will frequently talk or yell out in his sleep. This was said to have become far more common during the past several years. They described one instance where he fell out of bed due to dreaming that he was chasing someone and had to jump over a barrier. However, acting out dreams was not said to be a regular occurrence.   Psychiatric/Behavioral Health History: Depression: Mr. Nuzzi described his current mood as "poor." He noted that down periods are generally due to him realizing that certain physical limitations prevent him from doing enjoyable activities. He also acknowledged more frequently dwelling on "things that used to be" lately. Both he and his wife denied to their knowledge any formal mental health diagnoses in the past. Current or remote suicidal ideation, intent, or plan was denied.  Anxiety: Denied. Mania: Denied. Trauma History: Denied. Visual/auditory hallucinations: Denied. Delusional thoughts: Denied.   Tobacco: Denied. Alcohol: He denied current alcohol consumption as well as a history of problematic alcohol abuse or dependence.  Recreational drugs: Denied.  Family History: Problem Relation Age of Onset   Heart attack Mother    Stroke Mother        swelling in brain stem   Glaucoma Mother    Alcohol abuse Father    Hyperlipidemia Father    Hypertension  Father    Diabetes Father    Heart disease Father    Healthy Daughter    Healthy Daughter    Healthy Son    Cancer Maternal Aunt        lung cancer   Cancer Paternal Uncle        bone cancer   Colon cancer Neg Hx    Esophageal cancer Neg Hx    Stomach cancer Neg Hx    Inflammatory bowel disease Neg Hx    Liver disease Neg Hx    Pancreatic cancer Neg Hx    Rectal cancer Neg Hx    This information was confirmed by Mr. Veloz.  Academic/Vocational History: Highest level of educational attainment: 16 years. He completed high school and earned a Oncologist in Academic librarian from [employer]. He described himself as a below average (C) student in academic settings. No relative weaknesses were identified.  History of developmental delay: Denied. History of grade repetition: Denied. Enrollment in special education courses: Denied. History of LD/ADHD: Denied.   Employment: Retired. 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 He noted that his primary expertise surrounded cigarette filters rather than tobacco itself.   Evaluation Results:   Behavioral Observations: Mr. Savoca was accompanied by his wife, arrived to his appointment on time, and was appropriately dressed and groomed. He appeared alert.  He ambulated slowly and carried a cane for additional support. He did not exhibit frank balance instability. A slight tremor was noted in his right hand. His affect was relaxed and positive. Spontaneous speech was fluent. He did exhibit some mild hypophonia. Word finding difficulties were not observed during the clinical interview. Thought processes were coherent, organized, and normal in content. Insight into his cognitive difficulties appeared adequate.   During testing, sustained attention was appropriate. Task engagement was adequate and he persisted when challenged. He fatigued as the  evaluation progressed. It ultimately was abbreviated in response. Overall, Mr. Huesca was cooperative with the clinical interview and subsequent testing procedures.   Adequacy of Effort: The validity of neuropsychological testing is limited by the extent to which the individual being tested may be assumed to have exerted adequate effort during testing. Mr. Minnick expressed his intention to perform to the best of his abilities and exhibited adequate task engagement and persistence. Scores across stand-alone and embedded performance validity measures were within expectation. As such, the results of the current evaluation are believed to be a valid representation of Mr. Spedding' current cognitive functioning.  Test Results: Mr. Schoof was mildly disoriented at the time of the current evaluation. He was unable to provide his phone number. He was also unable to state the current date or day of the week.   Intellectual abilities based upon educational and vocational attainment were estimated to be in the average range. Premorbid abilities were estimated to be within the average range based upon a single-word reading test.   Processing speed was variable, ranging from the exceptionally low to average normative ranges. Basic attention was average. More complex attention (e.g., working memory) was unable to be assessed due to increased fatigue. Cognitive flexibility was exceptionally low to well below average. Other aspects of executive functioning were unable to be assessed.  Assessed receptive language abilities were well below average. However, he only missed a total of 4 points across this task, suggesting that this reflects a normative weakness more than a clinical weakness. Mr. Robies did not exhibit prominent difficulties comprehending task instructions and answered all questions asked of him appropriately. Assessed expressive language was somewhat variable. Verbal fluency was exceptionally low to well below  average while confrontation naming was average to above average.      Assessed visuospatial/visuoconstructional abilities were exceptionally low to well below average. Across his drawing of a clock, he included the number 10 twice. He also exhibited confusion and was unable to place the clock hands. Points were lost across his copy of a complex figure primarily due to him omitting several aspects entirely.    Learning (i.e., encoding) of novel verbal information was well below average to below average. Spontaneous delayed recall (i.e., retrieval) of previously learned information was exceptionally low to below average. Retention rates were 67% (raw score of 4) across a story learning task, 0% across a list learning task, and 54% across a figure drawing task. Performance across recognition tasks was exceptionally low to below average, suggesting limited evidence for information consolidation.   Results of emotional screening instruments suggested that recent symptoms of generalized anxiety were in the mild range, while symptoms of depression were also within the mild range. A screening instrument assessing recent sleep quality suggested the presence of minimal sleep dysfunction.  Tables of Scores:   Note: This summary of test scores accompanies the interpretive report and should not be considered in isolation without reference to the appropriate sections in the text. Descriptors  are based on appropriate normative data and may be adjusted based on clinical judgment. Terms such as "Within Normal Limits" and "Outside Normal Limits" are used when a more specific description of the test score cannot be determined. Descriptors refer to the current evaluation only.         Percentile - Normative Descriptor > 98 - Exceptionally High 91-97 - Well Above Average 75-90 - Above Average 25-74 - Average 9-24 - Below Average 2-8 - Well Below Average < 2 - Exceptionally Low        Validity: July 2022 Current   DESCRIPTOR        DCT: --- --- --- Within Normal Limits  RBANS EI: --- --- --- Within Normal Limits        Orientation:       Raw Score Raw Score Percentile   NAB Orientation, Form 1 26/29 23/29 --- ---        Cognitive Screening:       Raw Score Raw Score Percentile   SLUMS: 22/30 20/30 --- ---        RBANS, Form A: Standard Score/ Scaled Score Standard Score/ Scaled Score Percentile   Total Score 80 61 <1 Exceptionally Low  Immediate Memory 85 76 5 Well Below Average    List Learning 6 4 2  Well Below Average    Story Memory 9 7 16  Below Average  Visuospatial/Constructional 96 64 1 Exceptionally Low    Figure Copy 10 4 2  Well Below Average    Line Orientation 16/20 8/20 <2 Exceptionally Low  Language 82 74 4 Well Below Average    Picture Naming 10/10 10/10 51-75 Average    Semantic Fluency 3 1 <1 Exceptionally Low  Attention 79 72 3 Well Below Average    Digit Span 10 10 50 Average    Coding 3 1 <1 Exceptionally Low  Delayed Memory 84 60 <1 Exceptionally Low    List Recall 4/10 0/10 <2 Exceptionally Low    List Recognition 17/20 16/20 <2 Exceptionally Low    Story Recall 8 5 5  Well Below Average    Story Recognition 9/12 7/12 5-7 Well Below Average    Figure Recall 10 6 9  Below Average    Figure Recognition 6/8 4/8 9-20 Below Average         Intellectual Functioning:       Standard Score Standard Score Percentile   Test of Premorbid Functioning: 94 101 53 Average        Attention/Executive Function:      Oral Trail Making Test (OTMT): Raw Score (Z-Score) Raw Score (Z-Score) Percentile     Part A 7 secs.,  0 errors (0.12) 8 secs.,  0 errors (-0.33) 37 Average    Part B 58 secs.,  2 errors (-1.01) 68 secs.,  2 errors (-1.58) 6 Well Below Average        Symbol Digit Modalities Test (SDMT): Raw Score (Z-Score) Raw Score (Z-Score) Percentile     Oral 26 (-1.98) 10 (-3.16) <1 Exceptionally Low         Scaled Score Scaled Score Percentile   WAIS-IV Digit Span: 9  --- --- ---    Forward 10 --- --- ---    Backward 6 --- --- ---    Sequencing 10 --- --- ---         Scaled Score Scaled Score Percentile   WAIS-IV Similarities: 12 --- --- ---        D-KEFS Color-Word Interference Test: Raw Score (  Scaled Score) Raw Score (Scaled Score) Percentile     Color Naming 45 secs. (5) --- --- ---    Word Reading 36 secs. (5) --- --- ---    Inhibition 180 secs. (1) --- --- ---      Total Errors 9 errors (4) --- --- ---    Inhibition/Switching 180 secs. (1) --- --- ---      Total Errors 13 errors (1) --- --- ---        D-KEFS Verbal Fluency Test: Raw Score (Scaled Score) Raw Score (Scaled Score) Percentile     Letter Total Correct 31 (9) 21 (6) 9 Below Average    Category Total Correct 27 (8) 24 (6) 9 Below Average    Category Switching Total Correct 8 (5) 6 (3) 1 Exceptionally Low    Category Switching Accuracy 7 (6) 5 (4) 2 Well Below Average      Total Set Loss Errors 0 (13) 0 (13) 84 Above Average      Total Repetition Errors 1 (12) 4 (9) 37 Average        NAB Executive Functions Module, Form 1: T Score T Score Percentile     Judgment 64 --- --- ---        Language:      Verbal Fluency Test: Raw Score (T Score) Raw Score (T Score) Percentile     Phonemic Fluency (FAS) 31 (42) 21 (31) 3 Well Below Average    Animal Fluency 15 (44) 13 (36) 8 Well Below Average         NAB Language Module, Form 1: T Score T Score Percentile     Auditory Comprehension 57 36 8 Well Below Average    Naming 31/31 (59) 30/31 (58) 79 Above Average        Visuospatial/Visuoconstruction:       Raw Score Raw Score Percentile   Clock Drawing: 7/10 4/10 --- Impaired         Scaled Score Scaled Score Percentile   WAIS-IV Picture Completion: 10 --- --- ---  WAIS-IV Visual Puzzles: 7 --- --- ---        Mood and Personality:       Raw Score Raw Score Percentile   Geriatric Depression Scale: 19 15 --- Mild  Geriatric Anxiety Scale: 23 17 --- Mild    Somatic 6 4 ---  Minimal    Cognitive 9 5 --- Mild    Affective 8 8 --- Moderate        Additional Questionnaires:       Raw Score Raw Score Percentile   PROMIS Sleep Disturbance Questionnaire: 22 18 --- None to Slight   Informed Consent and Coding/Compliance:   The current evaluation represents a clinical evaluation for the purposes previously outlined by the referral source and is in no way reflective of a forensic evaluation.   Mr. Fluegel was provided with a verbal description of the nature and purpose of the present neuropsychological evaluation. Also reviewed were the foreseeable risks and/or discomforts and benefits of the procedure, limits of confidentiality, and mandatory reporting requirements of this provider. The patient was given the opportunity to ask questions and receive answers about the evaluation. Oral consent to participate was provided by the patient.   This evaluation was conducted by Newman Nickels, Ph.D., ABPP-CN, board certified clinical neuropsychologist. Mr. Kise completed a clinical interview with Dr. Milbert Coulter, billed as one unit [phone], and 120 minutes of cognitive testing and scoring, billed as one unit [phone] and three  additional units [id number]. Psychometrist Wallace Keller, B.S. assisted Dr. Milbert Coulter with test administration and scoring procedures. As a separate and discrete service, one unit [id number] and two units [phone] were billed for Dr. Tammy Sours time spent in interpretation and report writing.

## 2022-11-25 ENCOUNTER — Other Ambulatory Visit: Payer: Self-pay

## 2022-11-27 ENCOUNTER — Ambulatory Visit (INDEPENDENT_AMBULATORY_CARE_PROVIDER_SITE_OTHER): Payer: Medicare Other | Admitting: Psychology

## 2022-11-27 DIAGNOSIS — G20A1 Parkinson's disease without dyskinesia, without mention of fluctuations: Secondary | ICD-10-CM | POA: Diagnosis not present

## 2022-11-27 DIAGNOSIS — F02A3 Dementia in other diseases classified elsewhere, mild, with mood disturbance: Secondary | ICD-10-CM | POA: Diagnosis not present

## 2022-11-27 NOTE — Progress Notes (Signed)
   Neuropsychology Feedback Session [name]. [facility] [place] Department of Neurology  Reason for Referral:   Vincent Walker is a 77 y.o. right-handed Caucasian male referred by Kerin Salen, D.O., to characterize his current cognitive functioning and assist with diagnostic clarity and treatment planning in the context of Parkinson's disease and concern for progressive cognitive decline.   Feedback:   Vincent Walker completed a comprehensive neuropsychological evaluation on 11/20/2022. Please refer to that encounter for the full report and recommendations. Briefly, results suggested primary deficits surrounding processing speed, executive functioning, verbal fluency, visuospatial abilities, and essentially all aspects of learning and memory. Relative to his previous evaluation in July 2022, objective decline was exhibited across all assessed domains with the exception of confrontation naming (which exhibited stability). Decline was mild in nearly all cases. Visuospatial abilities likely saw the greatest negative change across cognitive domains. No domains saw improvement. Regarding etiology, it was previously theorized that his history of Parkinson's disease represented the most likely culprit for cognitive impairment. I continue to feel that Parkinson's disease best represents the cause for his current dementia presentation. His pattern of deficits continue to align well with expected patterns commonly seen across this illness. Despite the report of some REM sleep behaviors, I still do not see compelling evidence to suggest a Lewy body dementia presentation being more likely. While day-to-day dysfunction may be exacerbated by mild levels of psychiatric distress, this unfortunately cannot account for current testing performances, nor progressive decline over time.   Vincent Walker was accompanied by his wife during the current feedback session. Content of the current session focused on the results of  his neuropsychological evaluation. Vincent Walker was given the opportunity to ask questions and his questions were answered. He was encouraged to reach out should additional questions arise. A copy of his report was provided at the conclusion of the visit.      One unit [phone] was billed for Dr. Tammy Sours time spent preparing for, conducting, and documenting the current feedback session with Vincent Walker.

## 2022-11-28 ENCOUNTER — Encounter: Payer: Self-pay | Admitting: Neurology

## 2022-11-30 ENCOUNTER — Other Ambulatory Visit (HOSPITAL_BASED_OUTPATIENT_CLINIC_OR_DEPARTMENT_OTHER): Payer: Self-pay

## 2022-11-30 DIAGNOSIS — L218 Other seborrheic dermatitis: Secondary | ICD-10-CM | POA: Diagnosis not present

## 2022-11-30 DIAGNOSIS — L814 Other melanin hyperpigmentation: Secondary | ICD-10-CM | POA: Diagnosis not present

## 2022-11-30 DIAGNOSIS — L538 Other specified erythematous conditions: Secondary | ICD-10-CM | POA: Diagnosis not present

## 2022-11-30 DIAGNOSIS — C44722 Squamous cell carcinoma of skin of right lower limb, including hip: Secondary | ICD-10-CM | POA: Diagnosis not present

## 2022-11-30 DIAGNOSIS — D485 Neoplasm of uncertain behavior of skin: Secondary | ICD-10-CM | POA: Diagnosis not present

## 2022-11-30 DIAGNOSIS — L821 Other seborrheic keratosis: Secondary | ICD-10-CM | POA: Diagnosis not present

## 2022-11-30 DIAGNOSIS — Z85828 Personal history of other malignant neoplasm of skin: Secondary | ICD-10-CM | POA: Diagnosis not present

## 2022-11-30 DIAGNOSIS — Z08 Encounter for follow-up examination after completed treatment for malignant neoplasm: Secondary | ICD-10-CM | POA: Diagnosis not present

## 2022-11-30 MED ORDER — KETOCONAZOLE 2 % EX SHAM
MEDICATED_SHAMPOO | CUTANEOUS | 3 refills | Status: DC
  Filled 2022-11-30: qty 120, 30d supply, fill #0
  Filled 2022-12-24: qty 120, 30d supply, fill #1
  Filled 2023-01-22: qty 120, 30d supply, fill #2
  Filled 2023-02-21: qty 120, 30d supply, fill #3

## 2022-11-30 MED ORDER — FLUTICASONE PROPIONATE 0.05 % EX CREA
TOPICAL_CREAM | CUTANEOUS | 2 refills | Status: DC
  Filled 2022-11-30: qty 60, 30d supply, fill #0
  Filled 2022-12-24: qty 60, 30d supply, fill #1
  Filled 2023-01-22: qty 60, 30d supply, fill #2

## 2022-12-13 ENCOUNTER — Other Ambulatory Visit (HOSPITAL_BASED_OUTPATIENT_CLINIC_OR_DEPARTMENT_OTHER): Payer: Self-pay

## 2022-12-13 DIAGNOSIS — D0471 Carcinoma in situ of skin of right lower limb, including hip: Secondary | ICD-10-CM | POA: Diagnosis not present

## 2022-12-13 DIAGNOSIS — L080 Pyoderma: Secondary | ICD-10-CM | POA: Diagnosis not present

## 2022-12-13 MED ORDER — CEPHALEXIN 500 MG PO CAPS
500.0000 mg | ORAL_CAPSULE | Freq: Two times a day (BID) | ORAL | 0 refills | Status: DC
  Filled 2022-12-13: qty 20, 10d supply, fill #0

## 2022-12-24 ENCOUNTER — Other Ambulatory Visit (HOSPITAL_BASED_OUTPATIENT_CLINIC_OR_DEPARTMENT_OTHER): Payer: Self-pay

## 2022-12-24 ENCOUNTER — Other Ambulatory Visit: Payer: Self-pay

## 2022-12-25 ENCOUNTER — Other Ambulatory Visit (HOSPITAL_BASED_OUTPATIENT_CLINIC_OR_DEPARTMENT_OTHER): Payer: Self-pay

## 2022-12-25 ENCOUNTER — Other Ambulatory Visit: Payer: Self-pay

## 2022-12-28 DIAGNOSIS — H401112 Primary open-angle glaucoma, right eye, moderate stage: Secondary | ICD-10-CM | POA: Diagnosis not present

## 2022-12-28 DIAGNOSIS — H26491 Other secondary cataract, right eye: Secondary | ICD-10-CM | POA: Diagnosis not present

## 2022-12-28 DIAGNOSIS — D3131 Benign neoplasm of right choroid: Secondary | ICD-10-CM | POA: Diagnosis not present

## 2022-12-28 DIAGNOSIS — H401123 Primary open-angle glaucoma, left eye, severe stage: Secondary | ICD-10-CM | POA: Diagnosis not present

## 2022-12-28 DIAGNOSIS — Z961 Presence of intraocular lens: Secondary | ICD-10-CM | POA: Diagnosis not present

## 2022-12-28 DIAGNOSIS — H0100B Unspecified blepharitis left eye, upper and lower eyelids: Secondary | ICD-10-CM | POA: Diagnosis not present

## 2022-12-28 DIAGNOSIS — Z83511 Family history of glaucoma: Secondary | ICD-10-CM | POA: Diagnosis not present

## 2022-12-28 DIAGNOSIS — H524 Presbyopia: Secondary | ICD-10-CM | POA: Diagnosis not present

## 2022-12-28 DIAGNOSIS — H43813 Vitreous degeneration, bilateral: Secondary | ICD-10-CM | POA: Diagnosis not present

## 2022-12-28 DIAGNOSIS — H353131 Nonexudative age-related macular degeneration, bilateral, early dry stage: Secondary | ICD-10-CM | POA: Diagnosis not present

## 2022-12-28 DIAGNOSIS — H0100A Unspecified blepharitis right eye, upper and lower eyelids: Secondary | ICD-10-CM | POA: Diagnosis not present

## 2022-12-28 DIAGNOSIS — H04123 Dry eye syndrome of bilateral lacrimal glands: Secondary | ICD-10-CM | POA: Diagnosis not present

## 2022-12-31 ENCOUNTER — Other Ambulatory Visit (HOSPITAL_BASED_OUTPATIENT_CLINIC_OR_DEPARTMENT_OTHER): Payer: Self-pay

## 2022-12-31 ENCOUNTER — Other Ambulatory Visit: Payer: Self-pay | Admitting: Neurology

## 2022-12-31 DIAGNOSIS — G20B1 Parkinson's disease with dyskinesia, without mention of fluctuations: Secondary | ICD-10-CM

## 2022-12-31 MED ORDER — CARBIDOPA-LEVODOPA 25-100 MG PO TABS
ORAL_TABLET | ORAL | 0 refills | Status: DC
  Filled 2022-12-31: qty 630, 90d supply, fill #0

## 2023-01-07 ENCOUNTER — Encounter: Payer: Self-pay | Admitting: Neurology

## 2023-01-07 ENCOUNTER — Other Ambulatory Visit: Payer: Self-pay | Admitting: Neurology

## 2023-01-07 ENCOUNTER — Other Ambulatory Visit (HOSPITAL_BASED_OUTPATIENT_CLINIC_OR_DEPARTMENT_OTHER): Payer: Self-pay

## 2023-01-09 ENCOUNTER — Other Ambulatory Visit: Payer: Self-pay

## 2023-01-09 DIAGNOSIS — G20A1 Parkinson's disease without dyskinesia, without mention of fluctuations: Secondary | ICD-10-CM

## 2023-01-09 NOTE — Telephone Encounter (Signed)
Printed and in your box to sign with DX Code circled

## 2023-01-10 ENCOUNTER — Other Ambulatory Visit: Payer: Self-pay | Admitting: Neurology

## 2023-01-11 ENCOUNTER — Other Ambulatory Visit (HOSPITAL_BASED_OUTPATIENT_CLINIC_OR_DEPARTMENT_OTHER): Payer: Self-pay

## 2023-01-11 MED ORDER — CARBIDOPA-LEVODOPA ER 50-200 MG PO TBCR
1.0000 | EXTENDED_RELEASE_TABLET | Freq: Every day | ORAL | 0 refills | Status: DC
  Filled 2023-01-11: qty 90, 90d supply, fill #0

## 2023-01-16 ENCOUNTER — Other Ambulatory Visit (HOSPITAL_COMMUNITY): Payer: Self-pay

## 2023-01-16 ENCOUNTER — Telehealth: Payer: Self-pay | Admitting: Pharmacy Technician

## 2023-01-16 NOTE — Telephone Encounter (Signed)
Pharmacy Patient Advocate Encounter  Received notification from [facility] that Prior Authorization for inbrija 42mg  has been APPROVED from 10.16.24 to 10.16.25   PA #/Case ID/Reference #: [id number]

## 2023-01-16 NOTE — Telephone Encounter (Signed)
Pharmacy Patient Advocate Encounter   Received notification from Physician's Office that prior authorization for INBRIJA 42MG  is required/requested.   Insurance verification completed.   The patient is insured through [facility] .   Per test claim: PA required; PA submitted to [facility] via CoverMyMeds Key/confirmation #/EOC [id number] Status is pending

## 2023-01-22 ENCOUNTER — Other Ambulatory Visit: Payer: Self-pay

## 2023-01-23 ENCOUNTER — Other Ambulatory Visit (HOSPITAL_BASED_OUTPATIENT_CLINIC_OR_DEPARTMENT_OTHER): Payer: Self-pay

## 2023-01-24 ENCOUNTER — Other Ambulatory Visit: Payer: Self-pay

## 2023-01-31 DIAGNOSIS — D0471 Carcinoma in situ of skin of right lower limb, including hip: Secondary | ICD-10-CM | POA: Diagnosis not present

## 2023-02-06 ENCOUNTER — Other Ambulatory Visit (HOSPITAL_BASED_OUTPATIENT_CLINIC_OR_DEPARTMENT_OTHER): Payer: Self-pay

## 2023-02-06 ENCOUNTER — Other Ambulatory Visit: Payer: Self-pay | Admitting: Family Medicine

## 2023-02-06 ENCOUNTER — Other Ambulatory Visit: Payer: Self-pay

## 2023-02-06 MED ORDER — ATORVASTATIN CALCIUM 20 MG PO TABS
20.0000 mg | ORAL_TABLET | Freq: Every day | ORAL | 0 refills | Status: DC
  Filled 2023-02-06: qty 30, 30d supply, fill #0

## 2023-02-11 ENCOUNTER — Other Ambulatory Visit: Payer: Self-pay | Admitting: Neurology

## 2023-02-11 DIAGNOSIS — G20A1 Parkinson's disease without dyskinesia, without mention of fluctuations: Secondary | ICD-10-CM

## 2023-02-12 ENCOUNTER — Other Ambulatory Visit (HOSPITAL_BASED_OUTPATIENT_CLINIC_OR_DEPARTMENT_OTHER): Payer: Self-pay

## 2023-02-12 MED ORDER — MIRTAZAPINE 15 MG PO TABS
15.0000 mg | ORAL_TABLET | Freq: Every day | ORAL | 0 refills | Status: DC
  Filled 2023-02-12: qty 30, 30d supply, fill #0

## 2023-02-19 ENCOUNTER — Other Ambulatory Visit: Payer: Self-pay | Admitting: Neurology

## 2023-02-20 NOTE — Progress Notes (Signed)
Assessment/Plan:   1.  Parkinsons Disease  -Continue carbidopa/levodopa 25/100 IR, 2 at 8am/2 at 11am/2 at 2pm/1 at 6pm.  -Continue carbidopa/levodopa 50/200 at bedtime  -discussed walker at all times  -RX PT 2.  RBD/RLS/insomnia  -Continue clonazepam 0.5 mg, full tablet at bedtime.  Tried to stop this medication and things got much worse, especially mood change and daytime hypersomnolence did not get better.  Lower dosages resulted in increasing RBD symptoms.  PDMP is reviewed.  Refilled today.  -He is using Inbrija for this as well.  -on mirtazapine, 15 mg q hs.    -some of night issues due to nocturia.  Needs to f/u with PCP/urology 3.  Anxiety/depression  -Being followed by primary care.  On Depakote ER, 250 mg daily.  May need to increase this in the future.  Nighttime agitation is becoming more problematic 4.  Daytime hypersomnolence  -Nocturnal polysomnogram was negative, but he was awake most of the night so not sure that it was accurate 5.  Mild PDD  -Neurocognitive testing done in August, 2024 with evidence of mild PDD, mostly because of functional impairment.  -Has declined Nuplazid thus far  -encouraged counseling for wife as caregiving burden increases.  Much of the discussion centered around respite care for the wife as well as bringing in care for the patient. 6.  AM dizziness  -don't think related to Parkinsons Disease meds as it starts in the AM prior to taking meds and doesn't persist in the day  -he has d/c the amlodipine  -He is on low-dose atenolol for PVCs.  They have decreased the dose of time.  They don't think that they can decrease further d/t pvc's.  BP is not too low.     Subjective:   Vincent Walker was seen today in follow up for Parkinsons disease.  My previous records were reviewed prior to todays visit as well as outside records available to me. Pt with wife who supplements hx. patient did complete neurocognitive testing done since last visit and it  was felt that patient did not meet criteria for a mild dementia.  He has had no falls since last visit.  No syncopal episodes.  They have been working on getting him a scooter and they state that they are waiting for [facility] approval.  He has seen ophthalmology fairly recently.  Notes are reviewed.  We did trial him on Inbrija since our last visit and they report that it has helped "a lot".  He uses it primarily for RLS at night.    He is freezing and dragging the R leg - worse in the AM and better as he takes his medications most days, but not all days.  Memory loss is getting worse - he double took medication one day.  He is having more trouble with his medication management.  He now has a medication system that is working better for him.  His vision is partially problematic with this when the laid out the medication for him but the new system is working better.  No falls.  He denies hallucinations but wife states that he has had one when looking out the window but he thinks it was him seeing a shadow from the trees.  She states that everyone once in a while he will mention seeing things that aren't there but if she tells him its not there, he will get agitated.  Current prescribed movement disorder medications: carbidopa/levodopa 25/100 , 2 at 8am/2 at  11am/2 at 2pm/1 at 6pm Clonazepam 0.5 mg, 1 tablet at bedtime  carbidopa/levodopa 50/200 at bedtime Mirtazapine, 15 mg nightly Depakote, 250 mg daily Inbrija (started last visit)  PREVIOUS MEDICATIONS: Carbidopa/levodopa 25/100 IR; carbidopa/levodopa 25/100 CR (tried to change to see if would help EDS and didn't so changed back)  ALLERGIES:   Allergies  Allergen Reactions   Nsaids    Sulfa Antibiotics Other (See Comments)    Had a reaction as a child   Tolmetin Other (See Comments)    CURRENT MEDICATIONS:  Outpatient Encounter Medications as of 02/25/2023  Medication Sig   acetaminophen (TYLENOL) 500 MG tablet Take 1,000 mg by mouth every 6  (six) hours as needed (pain.).    atenolol (TENORMIN) 25 MG tablet Take 1 tablet (25 mg total) by mouth every evening.   atorvastatin (LIPITOR) 20 MG tablet Take 1 tablet (20 mg total) by mouth daily. Need appointment and labs for future refills.   Brinzolamide-Brimonidine (SIMBRINZA) 1-0.2 % SUSP Take 1 drop in both eyes 3 (three) times daily.   carbidopa-levodopa (SINEMET CR) 50-200 MG tablet Take 1 tablet by mouth at bedtime.   carbidopa-levodopa (SINEMET IR) 25-100 MG tablet Take 2 tablets by mouth every morning at 8 AM AND 2 tablets at 11 AM AND 2 tablets at 2 PM AND 1 tablet at 6 PM.   cephALEXin (KEFLEX) 500 MG capsule Take 1 capsule (500 mg total) by mouth 2 (two) times daily for 10 days.   COVID-19 mRNA vaccine 2023-2024 (COMIRNATY) syringe Inject into the muscle.   divalproex (DEPAKOTE ER) 250 MG 24 hr tablet Take 1 tablet (250 mg total) by mouth daily.   fluticasone (CUTIVATE) 0.05 % cream Apply to affected areas twice a day 1-2 weeks on/off repeat as needed   influenza vaccine adjuvanted (FLUAD QUADRIVALENT) 0.5 ML injection Inject into the muscle.   ketoconazole (NIZORAL) 2 % shampoo Apply directly to damp scalp, lather, let sit 5-10 minutes and rinse. Use every other day   Levodopa (INBRIJA) 42 MG CAPS TWO capsules is ONE dosage (never inhale just one capsule). You can inhale the capsules as needed up to 5 times per day, separated by 2 hour intervals.   mirtazapine (REMERON) 15 MG tablet Take 1 tablet (15 mg total) by mouth at bedtime.   Multiple Vitamins-Minerals (PRESERVISION AREDS 2 PO) Take 1 tablet by mouth daily.   Polyethyl Glycol-Propyl Glycol (LUBRICANT EYE DROPS) 0.4-0.3 % SOLN Place 1 drop into both eyes 3 (three) times daily as needed (dry/irritated eyes.).   traMADol (ULTRAM) 50 MG tablet Take 0.5-1 tablets (25-50 mg total) by mouth 3 (three) times daily as needed. (Patient taking differently: Take 25-50 mg by mouth as needed.)   [DISCONTINUED] clonazePAM (KLONOPIN) 0.5 MG  tablet Take 1 tablet (0.5 mg total) by mouth at bedtime.   clonazePAM (KLONOPIN) 0.5 MG tablet Take 1 tablet (0.5 mg total) by mouth at bedtime.   No facility-administered encounter medications on file as of 02/25/2023.    Objective:   PHYSICAL EXAMINATION:    VITALS:   Vitals:   02/25/23 1109  BP: (!) 140/88  Pulse: 67  SpO2: 98%  Weight: 207 lb 9.6 oz (94.2 kg)  Height: 6\' 3"  (1.905 m)    Wt Readings from Last 3 Encounters:  02/25/23 207 lb 9.6 oz (94.2 kg)  08/16/22 207 lb 9.6 oz (94.2 kg)  07/19/22 207 lb 3.2 oz (94 kg)   GEN:  The patient appears stated age and is in NAD. HEENT:  Normocephalic, atraumatic.  The mucous membranes are moist. The superficial temporal arteries are without ropiness or tenderness. CV:  RRR Lungs:  CTAB Neck/HEME:  There are no carotid bruits bilaterally.  Neurological examination:  Orientation: The patient is alert and oriented x3 but looks to wife for finer aspects Cranial nerves: There is good facial symmetry with facial hypomimia. The speech is fluent and clear. Soft palate rises symmetrically and there is no tongue deviation. Hearing is intact to conversational tone. Sensation: Sensation is intact to light touch throughout Motor: Strength is at least antigravity x4.  Movement examination: Tone: There is normal tone today in the upper and lower extremities. Abnormal movements: there is no tremor today Coordination:  There is no significant decremation with the exception of toe taps on the L Gait and Station: The patient pushes off to arise.  He doesn't shuffle today and actually turns well today.  No freezing today.    I have reviewed and interpreted the following labs independently    Chemistry      Component Value Date/Time   NA 139 12/27/2020 1443   K 4.5 12/27/2020 1443   CL 104 12/27/2020 1443   CO2 27 12/27/2020 1443   BUN 17 12/27/2020 1443   CREATININE 1.40 12/27/2020 1443      Component Value Date/Time   CALCIUM 10.0  12/27/2020 1443   ALKPHOS 82 12/27/2020 1443   AST 21 12/27/2020 1443   ALT 12 12/27/2020 1443   BILITOT 0.5 12/27/2020 1443       Lab Results  Component Value Date   WBC 9.2 08/28/2019   HGB 14.0 08/28/2019   HCT 41.8 08/28/2019   MCV 92.1 08/28/2019   PLT 190.0 08/28/2019    Lab Results  Component Value Date   TSH 2.45 04/06/2019     Total time spent on today's visit was 42 minutes, including both face-to-face time and nonface-to-face time.  Time included that spent on review of records (prior notes available to me/labs/imaging if pertinent), discussing treatment and goals, answering patient's questions and coordinating care.  Cc:  Bradd Canary, MD

## 2023-02-21 ENCOUNTER — Other Ambulatory Visit: Payer: Self-pay

## 2023-02-22 ENCOUNTER — Encounter (HOSPITAL_BASED_OUTPATIENT_CLINIC_OR_DEPARTMENT_OTHER): Payer: Self-pay

## 2023-02-22 ENCOUNTER — Other Ambulatory Visit (HOSPITAL_BASED_OUTPATIENT_CLINIC_OR_DEPARTMENT_OTHER): Payer: Self-pay

## 2023-02-25 ENCOUNTER — Other Ambulatory Visit (HOSPITAL_BASED_OUTPATIENT_CLINIC_OR_DEPARTMENT_OTHER): Payer: Self-pay

## 2023-02-25 ENCOUNTER — Encounter: Payer: Self-pay | Admitting: Neurology

## 2023-02-25 ENCOUNTER — Ambulatory Visit (INDEPENDENT_AMBULATORY_CARE_PROVIDER_SITE_OTHER): Payer: Medicare Other | Admitting: Neurology

## 2023-02-25 VITALS — BP 140/88 | HR 67 | Ht 75.0 in | Wt 207.6 lb

## 2023-02-25 DIAGNOSIS — F02A11 Dementia in other diseases classified elsewhere, mild, with agitation: Secondary | ICD-10-CM

## 2023-02-25 DIAGNOSIS — F411 Generalized anxiety disorder: Secondary | ICD-10-CM | POA: Diagnosis not present

## 2023-02-25 DIAGNOSIS — G20A1 Parkinson's disease without dyskinesia, without mention of fluctuations: Secondary | ICD-10-CM | POA: Diagnosis not present

## 2023-02-25 MED ORDER — CLONAZEPAM 0.5 MG PO TABS
0.5000 mg | ORAL_TABLET | Freq: Every day | ORAL | 5 refills | Status: DC
  Filled 2023-02-25: qty 30, 30d supply, fill #0
  Filled 2023-03-25: qty 30, 30d supply, fill #1
  Filled 2023-04-18 – 2023-04-22 (×2): qty 30, 30d supply, fill #2
  Filled 2023-05-19: qty 30, 30d supply, fill #3
  Filled 2023-06-19: qty 30, 30d supply, fill #4
  Filled 2023-07-17: qty 30, 30d supply, fill #5

## 2023-03-07 ENCOUNTER — Other Ambulatory Visit: Payer: Self-pay

## 2023-03-07 ENCOUNTER — Other Ambulatory Visit (HOSPITAL_BASED_OUTPATIENT_CLINIC_OR_DEPARTMENT_OTHER): Payer: Self-pay

## 2023-03-07 ENCOUNTER — Other Ambulatory Visit: Payer: Self-pay | Admitting: Neurology

## 2023-03-07 DIAGNOSIS — G20A1 Parkinson's disease without dyskinesia, without mention of fluctuations: Secondary | ICD-10-CM

## 2023-03-07 MED ORDER — MIRTAZAPINE 15 MG PO TABS
15.0000 mg | ORAL_TABLET | Freq: Every day | ORAL | 0 refills | Status: DC
  Filled 2023-03-07: qty 90, 90d supply, fill #0

## 2023-03-08 ENCOUNTER — Other Ambulatory Visit: Payer: Self-pay

## 2023-03-08 ENCOUNTER — Other Ambulatory Visit (HOSPITAL_BASED_OUTPATIENT_CLINIC_OR_DEPARTMENT_OTHER): Payer: Self-pay

## 2023-03-08 ENCOUNTER — Other Ambulatory Visit: Payer: Self-pay | Admitting: Family Medicine

## 2023-03-08 MED ORDER — ATORVASTATIN CALCIUM 20 MG PO TABS
20.0000 mg | ORAL_TABLET | Freq: Every day | ORAL | 0 refills | Status: DC
  Filled 2023-03-08: qty 30, 30d supply, fill #0

## 2023-03-08 MED ORDER — SIMBRINZA 1-0.2 % OP SUSP
1.0000 [drp] | Freq: Three times a day (TID) | OPHTHALMIC | 11 refills | Status: DC
  Filled 2023-03-08: qty 8, 30d supply, fill #0
  Filled 2023-04-03: qty 8, 30d supply, fill #1
  Filled 2023-05-03: qty 8, 30d supply, fill #2
  Filled 2023-06-03: qty 8, 30d supply, fill #3
  Filled 2023-06-28: qty 8, 30d supply, fill #4
  Filled 2023-07-29: qty 8, 30d supply, fill #5
  Filled 2023-08-27: qty 8, 30d supply, fill #6
  Filled 2023-09-23: qty 8, 30d supply, fill #7
  Filled 2023-10-23: qty 8, 30d supply, fill #8
  Filled 2023-11-20: qty 8, 30d supply, fill #9
  Filled 2023-12-20: qty 8, 30d supply, fill #10
  Filled 2024-01-20: qty 8, 30d supply, fill #11

## 2023-03-25 ENCOUNTER — Other Ambulatory Visit: Payer: Self-pay

## 2023-03-26 ENCOUNTER — Other Ambulatory Visit (HOSPITAL_BASED_OUTPATIENT_CLINIC_OR_DEPARTMENT_OTHER): Payer: Self-pay

## 2023-03-26 ENCOUNTER — Other Ambulatory Visit: Payer: Self-pay | Admitting: Neurology

## 2023-03-26 DIAGNOSIS — G20B1 Parkinson's disease with dyskinesia, without mention of fluctuations: Secondary | ICD-10-CM

## 2023-03-26 MED ORDER — CARBIDOPA-LEVODOPA 25-100 MG PO TABS
ORAL_TABLET | ORAL | 0 refills | Status: DC
  Filled 2023-03-28: qty 630, 90d supply, fill #0

## 2023-03-28 ENCOUNTER — Other Ambulatory Visit (HOSPITAL_BASED_OUTPATIENT_CLINIC_OR_DEPARTMENT_OTHER): Payer: Self-pay

## 2023-04-04 ENCOUNTER — Other Ambulatory Visit: Payer: Self-pay

## 2023-04-05 ENCOUNTER — Other Ambulatory Visit (HOSPITAL_BASED_OUTPATIENT_CLINIC_OR_DEPARTMENT_OTHER): Payer: Self-pay

## 2023-04-05 ENCOUNTER — Other Ambulatory Visit: Payer: Self-pay | Admitting: Family Medicine

## 2023-04-05 MED ORDER — ATORVASTATIN CALCIUM 20 MG PO TABS
20.0000 mg | ORAL_TABLET | Freq: Every day | ORAL | 0 refills | Status: DC
  Filled 2023-04-05: qty 30, 30d supply, fill #0

## 2023-04-05 NOTE — Telephone Encounter (Signed)
 Copied from CRM [id number]. Topic: Appointments - Scheduling Inquiry for Clinic >> Apr 05, 2023 11:19 AM Graeme ORN wrote: Reason for CRM: Patient wife called received a letter from provider. Per letter schedule with the next few weeks. Per schedule next appointment not until May 5th. Checking if provider needs to see patient prior to first available appt. Thank You.

## 2023-04-08 ENCOUNTER — Other Ambulatory Visit: Payer: Self-pay | Admitting: Neurology

## 2023-04-08 ENCOUNTER — Other Ambulatory Visit (HOSPITAL_BASED_OUTPATIENT_CLINIC_OR_DEPARTMENT_OTHER): Payer: Self-pay

## 2023-04-08 MED ORDER — CARBIDOPA-LEVODOPA ER 50-200 MG PO TBCR
1.0000 | EXTENDED_RELEASE_TABLET | Freq: Every day | ORAL | 0 refills | Status: DC
  Filled 2023-04-08: qty 90, 90d supply, fill #0

## 2023-04-10 DIAGNOSIS — L570 Actinic keratosis: Secondary | ICD-10-CM | POA: Diagnosis not present

## 2023-04-10 DIAGNOSIS — Z08 Encounter for follow-up examination after completed treatment for malignant neoplasm: Secondary | ICD-10-CM | POA: Diagnosis not present

## 2023-04-10 DIAGNOSIS — L821 Other seborrheic keratosis: Secondary | ICD-10-CM | POA: Diagnosis not present

## 2023-04-10 DIAGNOSIS — L218 Other seborrheic dermatitis: Secondary | ICD-10-CM | POA: Diagnosis not present

## 2023-04-10 DIAGNOSIS — Z85828 Personal history of other malignant neoplasm of skin: Secondary | ICD-10-CM | POA: Diagnosis not present

## 2023-04-19 ENCOUNTER — Other Ambulatory Visit (HOSPITAL_BASED_OUTPATIENT_CLINIC_OR_DEPARTMENT_OTHER): Payer: Self-pay

## 2023-04-19 ENCOUNTER — Other Ambulatory Visit: Payer: Self-pay

## 2023-04-19 ENCOUNTER — Other Ambulatory Visit: Payer: Self-pay | Admitting: Neurology

## 2023-04-19 DIAGNOSIS — G20A1 Parkinson's disease without dyskinesia, without mention of fluctuations: Secondary | ICD-10-CM

## 2023-04-19 MED ORDER — DIVALPROEX SODIUM ER 250 MG PO TB24
250.0000 mg | ORAL_TABLET | Freq: Every day | ORAL | 0 refills | Status: DC
  Filled 2023-04-19: qty 90, 90d supply, fill #0

## 2023-04-28 ENCOUNTER — Other Ambulatory Visit (HOSPITAL_BASED_OUTPATIENT_CLINIC_OR_DEPARTMENT_OTHER): Payer: Self-pay

## 2023-04-30 ENCOUNTER — Other Ambulatory Visit (HOSPITAL_BASED_OUTPATIENT_CLINIC_OR_DEPARTMENT_OTHER): Payer: Self-pay

## 2023-05-01 ENCOUNTER — Other Ambulatory Visit (HOSPITAL_BASED_OUTPATIENT_CLINIC_OR_DEPARTMENT_OTHER): Payer: Self-pay

## 2023-05-02 ENCOUNTER — Other Ambulatory Visit (HOSPITAL_BASED_OUTPATIENT_CLINIC_OR_DEPARTMENT_OTHER): Payer: Self-pay

## 2023-05-02 ENCOUNTER — Other Ambulatory Visit: Payer: Self-pay | Admitting: Family Medicine

## 2023-05-02 ENCOUNTER — Other Ambulatory Visit: Payer: Self-pay

## 2023-05-02 MED ORDER — KETOCONAZOLE 2 % EX SHAM
1.0000 | MEDICATED_SHAMPOO | CUTANEOUS | 3 refills | Status: AC
  Filled 2023-05-02: qty 120, 30d supply, fill #0
  Filled 2023-09-08: qty 120, 30d supply, fill #1

## 2023-05-03 ENCOUNTER — Other Ambulatory Visit (HOSPITAL_BASED_OUTPATIENT_CLINIC_OR_DEPARTMENT_OTHER): Payer: Self-pay

## 2023-05-03 ENCOUNTER — Other Ambulatory Visit: Payer: Self-pay

## 2023-05-03 ENCOUNTER — Other Ambulatory Visit: Payer: Self-pay | Admitting: Family Medicine

## 2023-05-03 DIAGNOSIS — H0100A Unspecified blepharitis right eye, upper and lower eyelids: Secondary | ICD-10-CM | POA: Diagnosis not present

## 2023-05-03 DIAGNOSIS — H401123 Primary open-angle glaucoma, left eye, severe stage: Secondary | ICD-10-CM | POA: Diagnosis not present

## 2023-05-03 DIAGNOSIS — Z961 Presence of intraocular lens: Secondary | ICD-10-CM | POA: Diagnosis not present

## 2023-05-03 DIAGNOSIS — H04123 Dry eye syndrome of bilateral lacrimal glands: Secondary | ICD-10-CM | POA: Diagnosis not present

## 2023-05-03 DIAGNOSIS — H0100B Unspecified blepharitis left eye, upper and lower eyelids: Secondary | ICD-10-CM | POA: Diagnosis not present

## 2023-05-03 DIAGNOSIS — Z83511 Family history of glaucoma: Secondary | ICD-10-CM | POA: Diagnosis not present

## 2023-05-03 DIAGNOSIS — H401112 Primary open-angle glaucoma, right eye, moderate stage: Secondary | ICD-10-CM | POA: Diagnosis not present

## 2023-05-03 DIAGNOSIS — H26491 Other secondary cataract, right eye: Secondary | ICD-10-CM | POA: Diagnosis not present

## 2023-05-03 MED ORDER — ATORVASTATIN CALCIUM 20 MG PO TABS
20.0000 mg | ORAL_TABLET | Freq: Every day | ORAL | 0 refills | Status: DC
  Filled 2023-05-03: qty 90, 90d supply, fill #0

## 2023-05-06 ENCOUNTER — Other Ambulatory Visit (HOSPITAL_BASED_OUTPATIENT_CLINIC_OR_DEPARTMENT_OTHER): Payer: Self-pay

## 2023-05-06 DIAGNOSIS — H26491 Other secondary cataract, right eye: Secondary | ICD-10-CM | POA: Diagnosis not present

## 2023-05-06 MED ORDER — PREDNISOLONE ACETATE 1 % OP SUSP
1.0000 [drp] | Freq: Four times a day (QID) | OPHTHALMIC | 0 refills | Status: DC
  Filled 2023-05-06: qty 5, 25d supply, fill #0

## 2023-05-14 DIAGNOSIS — H353132 Nonexudative age-related macular degeneration, bilateral, intermediate dry stage: Secondary | ICD-10-CM | POA: Diagnosis not present

## 2023-05-14 DIAGNOSIS — H35361 Drusen (degenerative) of macula, right eye: Secondary | ICD-10-CM | POA: Diagnosis not present

## 2023-05-14 DIAGNOSIS — H524 Presbyopia: Secondary | ICD-10-CM | POA: Diagnosis not present

## 2023-05-14 DIAGNOSIS — H401112 Primary open-angle glaucoma, right eye, moderate stage: Secondary | ICD-10-CM | POA: Diagnosis not present

## 2023-05-14 DIAGNOSIS — H401123 Primary open-angle glaucoma, left eye, severe stage: Secondary | ICD-10-CM | POA: Diagnosis not present

## 2023-05-20 ENCOUNTER — Other Ambulatory Visit: Payer: Self-pay

## 2023-05-21 ENCOUNTER — Ambulatory Visit: Payer: Self-pay

## 2023-05-21 ENCOUNTER — Institutional Professional Consult (permissible substitution): Payer: Medicare Other | Admitting: Psychology

## 2023-05-28 ENCOUNTER — Encounter: Payer: Medicare Other | Admitting: Psychology

## 2023-06-05 ENCOUNTER — Other Ambulatory Visit (HOSPITAL_BASED_OUTPATIENT_CLINIC_OR_DEPARTMENT_OTHER): Payer: Self-pay

## 2023-06-05 ENCOUNTER — Other Ambulatory Visit: Payer: Self-pay | Admitting: Neurology

## 2023-06-05 DIAGNOSIS — G20A1 Parkinson's disease without dyskinesia, without mention of fluctuations: Secondary | ICD-10-CM

## 2023-06-05 MED ORDER — MIRTAZAPINE 15 MG PO TABS
15.0000 mg | ORAL_TABLET | Freq: Every day | ORAL | 0 refills | Status: DC
  Filled 2023-06-05: qty 90, 90d supply, fill #0

## 2023-06-20 ENCOUNTER — Other Ambulatory Visit: Payer: Self-pay

## 2023-06-24 ENCOUNTER — Other Ambulatory Visit (HOSPITAL_BASED_OUTPATIENT_CLINIC_OR_DEPARTMENT_OTHER): Payer: Self-pay

## 2023-06-24 ENCOUNTER — Other Ambulatory Visit: Payer: Self-pay | Admitting: Neurology

## 2023-06-24 DIAGNOSIS — G20B1 Parkinson's disease with dyskinesia, without mention of fluctuations: Secondary | ICD-10-CM

## 2023-06-24 MED ORDER — CARBIDOPA-LEVODOPA 25-100 MG PO TABS
ORAL_TABLET | ORAL | 0 refills | Status: DC
  Filled 2023-06-25: qty 630, 90d supply, fill #0

## 2023-06-25 ENCOUNTER — Other Ambulatory Visit: Payer: Self-pay

## 2023-06-25 ENCOUNTER — Other Ambulatory Visit (HOSPITAL_BASED_OUTPATIENT_CLINIC_OR_DEPARTMENT_OTHER): Payer: Self-pay

## 2023-06-30 ENCOUNTER — Encounter: Payer: Self-pay | Admitting: Neurology

## 2023-07-07 NOTE — Assessment & Plan Note (Signed)
 Encouraged heart healthy diet, increase exercise, avoid trans fats, consider a krill oil cap daily

## 2023-07-07 NOTE — Assessment & Plan Note (Signed)
 hgba1c acceptable, minimize simple carbs. Increase exercise as tolerated.

## 2023-07-07 NOTE — Assessment & Plan Note (Signed)
 Hydrate and monitor

## 2023-07-07 NOTE — Assessment & Plan Note (Signed)
 Well controlled, no changes to meds. Encouraged heart healthy diet such as the DASH diet and exercise as tolerated.

## 2023-07-07 NOTE — Assessment & Plan Note (Signed)
 Encouraged DASH or MIND diet, decrease po intake and increase exercise as tolerated. Needs 7-8 hours of sleep nightly. Avoid trans fats, eat small, frequent meals every 4-5 hours with lean proteins, complex carbs and healthy fats. Minimize simple carbs, high fat foods and processed foods

## 2023-07-07 NOTE — Assessment & Plan Note (Signed)
Check a cbc regularly

## 2023-07-08 ENCOUNTER — Other Ambulatory Visit: Payer: Self-pay | Admitting: Neurology

## 2023-07-08 ENCOUNTER — Encounter: Payer: Self-pay | Admitting: Family Medicine

## 2023-07-08 ENCOUNTER — Other Ambulatory Visit: Payer: Self-pay

## 2023-07-08 ENCOUNTER — Ambulatory Visit (INDEPENDENT_AMBULATORY_CARE_PROVIDER_SITE_OTHER): Payer: Medicare Other | Admitting: Family Medicine

## 2023-07-08 ENCOUNTER — Other Ambulatory Visit (HOSPITAL_BASED_OUTPATIENT_CLINIC_OR_DEPARTMENT_OTHER): Payer: Self-pay

## 2023-07-08 VITALS — BP 136/80 | HR 64 | Temp 98.2°F | Resp 16 | Ht 75.0 in | Wt 207.8 lb

## 2023-07-08 DIAGNOSIS — Z6830 Body mass index (BMI) 30.0-30.9, adult: Secondary | ICD-10-CM | POA: Diagnosis not present

## 2023-07-08 DIAGNOSIS — I10 Essential (primary) hypertension: Secondary | ICD-10-CM

## 2023-07-08 DIAGNOSIS — E785 Hyperlipidemia, unspecified: Secondary | ICD-10-CM

## 2023-07-08 DIAGNOSIS — D696 Thrombocytopenia, unspecified: Secondary | ICD-10-CM | POA: Diagnosis not present

## 2023-07-08 DIAGNOSIS — R351 Nocturia: Secondary | ICD-10-CM | POA: Diagnosis not present

## 2023-07-08 DIAGNOSIS — R739 Hyperglycemia, unspecified: Secondary | ICD-10-CM | POA: Diagnosis not present

## 2023-07-08 DIAGNOSIS — N183 Chronic kidney disease, stage 3 unspecified: Secondary | ICD-10-CM | POA: Diagnosis not present

## 2023-07-08 DIAGNOSIS — E66811 Obesity, class 1: Secondary | ICD-10-CM | POA: Diagnosis not present

## 2023-07-08 MED ORDER — CARBIDOPA-LEVODOPA ER 50-200 MG PO TBCR
1.0000 | EXTENDED_RELEASE_TABLET | Freq: Every day | ORAL | 0 refills | Status: DC
  Filled 2023-07-08: qty 90, 90d supply, fill #0

## 2023-07-08 NOTE — Progress Notes (Signed)
 Subjective:    Patient ID: Vincent Walker, male    DOB: Apr 07, 1945, 78 y.o.   MRN: [id number]  Chief Complaint  Patient presents with   Follow-up    HPI Discussed the use of AI scribe software for clinical note transcription with the patient, who gave verbal consent to proceed.  History of Present Illness [name] is an 78 year old male with Parkinson's disease who presents with worsening symptoms. He is accompanied by his caregiver.  Over the past month, he has experienced a significant increase in Parkinson's symptoms, including increased difficulty walking due to his right leg freezing up, even with the use of a walker. Despite adherence to his medication regimen, including carbidopa-levodopa and an extended-release form taken at night, his symptoms have worsened.  He experiences delusions and hallucinations, which are more pronounced at night, contributing to his overall distress and complicating his management.  His sleep is disrupted, primarily due to difficulty staying asleep. He often wakes up around 4 or 5 AM and is unable to return to sleep. Frequent nighttime urination may be related to his Parkinson's disease.  He has difficulty performing daily tasks such as making coffee or using the microwave, leading to frustration and fatigue. He often requires assistance in the morning to ensure he takes his medications and eats breakfast. He takes mirtazapine and clonazepam at night, along with his Parkinson's medications.  He struggles with walking on uneven ground, which exacerbates his symptoms. His caregiver has been exploring various disability aids to assist him, but some have been confusing or unhelpful.  There is a concern for a possible urinary tract infection due to increased confusion and weakness, although he does not exhibit typical symptoms.    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 Required nasal packing a couple of days ago.  Had a similar episode of epistaxis a little over a year ago.  At that visit, I could not see the exact bleeding spot. EXAMINATION after packing removal reveals several excoriated areas anteriorly but I was unable to see t   Essential hypertension 12/25/2011   Eustachian tube dysfunction, left 02/21/2021   Flank pain 07/19/2022   History of blood transfusion 2010   After Kidney surgery   History of chicken pox    History of renal cell carcinoma 02/08/2015   diagnosed and removed in 2010 Right Monitored by Dr Gaynelle Arabian of urology at [facility] Dr Karin Lieu, nephrology at [facility]   Hyperglycemia 12/23/2015   Increased thyroid stimulating hormone (TSH) level 06/07/2017   Ingrown left big toenail 02/14/2015   Left foot pain 02/14/2015   MVP (mitral valve prolapse) 05/14/2016   Parkinson's disease (HCC) 12/23/2015   Posterior vitreous detachment of both eyes 08/21/2022   Primary open-angle glaucoma, left eye, severe stage 05/05/2021   Primary open-angle glaucoma, right eye, moderate stage 01/10/2016   Pseudophakia, both eyes 08/21/2022   Right hip pain 12/12/2016   Stage 3 chronic  kidney disease 12/25/2011   Thrombocytopenia 02/08/2015   Tremor 02/14/2015    Past Surgical History:  Procedure Laterality Date   APPENDECTOMY  1995   CATARACT EXTRACTION, BILATERAL     COLONOSCOPY     COLONOSCOPY WITH PROPOFOL N/A 12/17/2018   Procedure: COLONOSCOPY WITH PROPOFOL;  Surgeon:  Meridee Score Netty Starring., MD;  Location: [facility] ENDOSCOPY;  Service: Gastroenterology;  Laterality: N/A;   ENDOSCOPIC MUCOSAL RESECTION N/A 12/17/2018   Procedure: ENDOSCOPIC MUCOSAL RESECTION;  Surgeon: Meridee Score Netty Starring., MD;  Location: [facility] ENDOSCOPY;  Service: Gastroenterology;  Laterality: N/A;   HEMOSTASIS CLIP PLACEMENT  12/17/2018   Procedure: HEMOSTASIS CLIP PLACEMENT;  Surgeon: Lemar Lofty., MD;  Location: [facility] ENDOSCOPY;  Service: Gastroenterology;;   left knee scope  2003   POLYPECTOMY  12/17/2018   Procedure: POLYPECTOMY;  Surgeon: Mansouraty, Netty Starring., MD;  Location: [facility] ENDOSCOPY;  Service: Gastroenterology;;   right knee scope  1999   SUBMUCOSAL LIFTING INJECTION  12/17/2018   Procedure: SUBMUCOSAL LIFTING INJECTION;  Surgeon: Lemar Lofty., MD;  Location: [facility] ENDOSCOPY;  Service: Gastroenterology;;   TOE SURGERY Left    metal 2nd toe- and top of foot- straighten bone   TONSILLECTOMY     TOTAL KNEE ARTHROPLASTY Left 07/06/2014   TOTAL NEPHRECTOMY Right     Family History  Problem Relation Age of Onset   Heart attack Mother    Stroke Mother        swelling in brain stem   Glaucoma Mother    Alcohol abuse Father    Hyperlipidemia Father    Hypertension Father    Diabetes Father    Heart disease Father    Healthy Daughter    Healthy Daughter    Healthy Son    Cancer Maternal Aunt        lung cancer   Cancer Paternal Uncle        bone cancer   Colon cancer Neg Hx    Esophageal cancer Neg Hx    Stomach cancer Neg Hx    Inflammatory bowel disease Neg Hx    Liver disease Neg Hx    Pancreatic cancer Neg Hx    Rectal cancer Neg Hx     Social History   Socioeconomic History   Marital status: Married    Spouse name: Not on file   Number of children: 3   Years of education: 16   Highest education level: Bachelor's degree (e.g., BA, AB, BS)  Occupational History   Occupation: Retired    CommentHydrologist in [employer]  Tobacco Use    Smoking status: Former    Types: Pipe    Quit date: 11/18/1995    Years since quitting: 27.6   Smokeless tobacco: Never   Tobacco comments:    stopped 20 years ago.  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, no major dietary restrictions   Other Topics Concern   Not on file  Social History Narrative   Right Handed   Lives in a one story home   Drinks one cup of coffee a day   Social Drivers of Health   Financial Resource Strain: Low Risk  (07/06/2023)   Overall Financial Resource Strain (CARDIA)    Difficulty of Paying Living Expenses: Not hard at all  Food Insecurity: No Food Insecurity (07/06/2023)   Hunger Vital Sign    Worried About Running Out of Food in the Last Year: Never true    Ran Out of Food in the Last Year: Never true  Transportation Needs: No Transportation Needs (07/06/2023)   PRAPARE - Administrator, Civil Service (Medical): No    Lack of Transportation (Non-Medical): No  Physical Activity: Unknown (07/06/2023)   Exercise Vital Sign    Days of Exercise per Week: 0 days    Minutes of Exercise per Session: Not on file  Stress: No Stress Concern Present (07/06/2023)   [employer] of Occupational Health - Occupational Stress Questionnaire    Feeling of Stress : Only a little  Social Connections: Unknown (07/06/2023)   Social Connection and Isolation Panel [NHANES]    Frequency of Communication with Friends and Family: Once a week    Frequency of Social Gatherings with Friends and Family: Patient declined    Attends Religious Services: Never    Database administrator or Organizations: No    Attends Engineer, structural: Not on file    Marital Status: Married  Catering manager Violence: Not At Risk (11/27/2021)   Humiliation, Afraid, Rape, and Kick questionnaire     Fear of Current or Ex-Partner: No    Emotionally Abused: No    Physically Abused: No    Sexually Abused: No    Outpatient Medications Prior to Visit  Medication Sig Dispense Refill   acetaminophen (TYLENOL) 500 MG tablet Take 1,000 mg by mouth every 6 (six) hours as needed (pain.).      atenolol (TENORMIN) 25 MG tablet Take 1 tablet (25 mg total) by mouth every evening. (Patient taking differently: Take 12.5 mg by mouth daily.) 90 tablet 3   atorvastatin (LIPITOR) 20 MG tablet Take 1 tablet (20 mg total) by mouth daily. 90 tablet 0   Brinzolamide-Brimonidine (SIMBRINZA) 1-0.2 % SUSP Place 1 drop into both eyes 3 (three) times daily. 8 mL 11   carbidopa-levodopa (SINEMET CR) 50-200 MG tablet Take 1 tablet by mouth at bedtime. 90 tablet 0   carbidopa-levodopa (SINEMET IR) 25-100 MG tablet Take 2 tablets by mouth at 8am, 2 tablets at 11am, 2 tablets at 2pm, and 1 tablet at 6pm. 630 tablet 0   clonazePAM (KLONOPIN) 0.5 MG tablet Take 1 tablet (0.5 mg total) by mouth at bedtime. 30 tablet 5   divalproex (DEPAKOTE ER) 250 MG 24 hr tablet Take 1 tablet (250 mg total) by mouth daily. 90 tablet 0   ketoconazole (NIZORAL) 2 % shampoo Use every other day, applying directly to damp scalp, lather, let sit 5-10 minutes and rinse. 120 mL 3   Levodopa (INBRIJA) 42 MG CAPS TWO capsules is ONE dosage (never inhale just one capsule). You can inhale the capsules as needed up to 5 times per day, separated by 2 hour intervals. 300 capsule 5   mirtazapine (REMERON) 15 MG tablet Take 1 tablet (15 mg total) by mouth at bedtime. 90 tablet 0   Multiple Vitamins-Minerals (PRESERVISION AREDS 2 PO) Take 1 tablet by mouth daily.     Polyethyl Glycol-Propyl Glycol (LUBRICANT EYE DROPS) 0.4-0.3 % SOLN Place 1 drop into both eyes 3 (three) times daily as needed (dry/irritated eyes.).     influenza vaccine adjuvanted (FLUAD QUADRIVALENT) 0.5 ML injection Inject into the muscle. 0.5 mL 0   cephALEXin (KEFLEX) 500 MG capsule Take  1 capsule (500 mg total) by mouth 2 (two) times  daily for 10 days. (Patient not taking: Reported on 07/08/2023) 20 capsule 0   COVID-19 mRNA vaccine 2023-2024 (COMIRNATY) syringe Inject into the muscle. 0.3 mL 0   fluticasone (CUTIVATE) 0.05 % cream Apply to affected areas twice a day 1-2 weeks on/off repeat as needed (Patient not taking: Reported on 07/08/2023) 60 g 2   prednisoLONE acetate (PRED FORTE) 1 % ophthalmic suspension Place 1 drop into the right eye 4 (four) times daily for 4 days (Patient not taking: Reported on 07/08/2023) 5 mL 0   traMADol (ULTRAM) 50 MG tablet Take 0.5-1 tablets (25-50 mg total) by mouth 3 (three) times daily as needed. (Patient not taking: Reported on 07/08/2023) 15 tablet 0   No facility-administered medications prior to visit.    Allergies  Allergen Reactions   Nsaids    Sulfa Antibiotics Other (See Comments)    Had a reaction as a child   Tolmetin Other (See Comments)    Review of Systems  Constitutional:  Positive for malaise/fatigue. Negative for fever.  HENT:  Negative for congestion.   Eyes:  Negative for blurred vision.  Respiratory:  Negative for shortness of breath.   Cardiovascular:  Negative for chest pain, palpitations and leg swelling.  Gastrointestinal:  Negative for abdominal pain, blood in stool and nausea.  Genitourinary:  Negative for dysuria and frequency.  Musculoskeletal:  Negative for falls.  Skin:  Negative for rash.  Neurological:  Negative for dizziness, loss of consciousness and headaches.  Endo/Heme/Allergies:  Negative for environmental allergies.  Psychiatric/Behavioral:  Positive for memory loss. Negative for depression. The patient is nervous/anxious.        Objective:    Physical Exam Vitals reviewed.  Constitutional:      Appearance: Normal appearance. He is not ill-appearing.  HENT:     Head: Normocephalic and atraumatic.     Nose: Nose normal.  Eyes:     Conjunctiva/sclera: Conjunctivae normal.  Cardiovascular:      Rate and Rhythm: Normal rate.     Pulses: Normal pulses.     Heart sounds: Normal heart sounds. No murmur heard. Pulmonary:     Effort: Pulmonary effort is normal.     Breath sounds: Normal breath sounds. No wheezing.  Abdominal:     Palpations: Abdomen is soft. There is no mass.     Tenderness: There is no abdominal tenderness.  Musculoskeletal:     Cervical back: Normal range of motion.     Right lower leg: No edema.     Left lower leg: No edema.  Skin:    General: Skin is warm and dry.  Neurological:     General: No focal deficit present.     Mental Status: He is alert and oriented to person, place, and time.  Psychiatric:        Mood and Affect: Mood normal.     BP 136/80 (BP Location: Left Arm, Patient Position: Sitting, Cuff Size: Normal)   Pulse 64   Temp 98.2 F (36.8 C) (Oral)   Resp 16   Ht 6\' 3"  (1.905 m)   Wt 207 lb 12.8 oz (94.3 kg)   SpO2 100%   BMI 25.97 kg/m  Wt Readings from Last 3 Encounters:  07/08/23 207 lb 12.8 oz (94.3 kg)  02/25/23 207 lb 9.6 oz (94.2 kg)  08/16/22 207 lb 9.6 oz (94.2 kg)    Diabetic Foot Exam - Simple   No data filed    Lab Results  Component Value Date   WBC  9.2 08/28/2019   HGB 14.0 08/28/2019   HCT 41.8 08/28/2019   PLT 190.0 08/28/2019   GLUCOSE 114 (H) 12/27/2020   CHOL 110 12/27/2020   TRIG 187.0 (H) 12/27/2020   HDL 44.30 12/27/2020   LDLCALC 28 12/27/2020   ALT 12 12/27/2020   AST 21 12/27/2020   NA 139 12/27/2020   K 4.5 12/27/2020   CL 104 12/27/2020   CREATININE 1.40 12/27/2020   BUN 17 12/27/2020   CO2 27 12/27/2020   TSH 2.45 04/06/2019   INR 1.1 (H) 12/12/2018   HGBA1C 6.0 12/27/2020    Lab Results  Component Value Date   TSH 2.45 04/06/2019   Lab Results  Component Value Date   WBC 9.2 08/28/2019   HGB 14.0 08/28/2019   HCT 41.8 08/28/2019   MCV 92.1 08/28/2019   PLT 190.0 08/28/2019   Lab Results  Component Value Date   NA 139 12/27/2020   K 4.5 12/27/2020   CO2 27  12/27/2020   GLUCOSE 114 (H) 12/27/2020   BUN 17 12/27/2020   CREATININE 1.40 12/27/2020   BILITOT 0.5 12/27/2020   ALKPHOS 82 12/27/2020   AST 21 12/27/2020   ALT 12 12/27/2020   PROT 6.8 12/27/2020   ALBUMIN 4.5 12/27/2020   CALCIUM 10.0 12/27/2020   GFR 48.44 (L) 12/27/2020   Lab Results  Component Value Date   CHOL 110 12/27/2020   Lab Results  Component Value Date   HDL 44.30 12/27/2020   Lab Results  Component Value Date   LDLCALC 28 12/27/2020   Lab Results  Component Value Date   TRIG 187.0 (H) 12/27/2020   Lab Results  Component Value Date   CHOLHDL 2 12/27/2020   Lab Results  Component Value Date   HGBA1C 6.0 12/27/2020       Assessment & Plan:  Dyslipidemia Assessment & Plan: Encouraged heart healthy diet, increase exercise, avoid trans fats, consider a krill oil cap daily  Orders: -     Lipid panel; Future  Essential hypertension Assessment & Plan: Well controlled, no changes to meds. Encouraged heart healthy diet such as the DASH diet and exercise as tolerated.    Orders: -     CBC with Differential/Platelet; Future -     Comprehensive metabolic panel with GFR; Future -     TSH; Future  Hyperglycemia Assessment & Plan: hgba1c acceptable, minimize simple carbs. Increase exercise as tolerated.   Orders: -     Hemoglobin A1c; Future  Class 1 obesity with body mass index (BMI) of 30.0 to 30.9 in adult, unspecified obesity type, unspecified whether serious comorbidity present Assessment & Plan: Encouraged DASH or MIND diet, decrease po intake and increase exercise as tolerated. Needs 7-8 hours of sleep nightly. Avoid trans fats, eat small, frequent meals every 4-5 hours with lean proteins, complex carbs and healthy fats. Minimize simple carbs, high fat foods and processed foods    Stage 3 chronic kidney disease, unspecified whether stage 3a or 3b CKD (HCC) Assessment & Plan: Hydrate and monitor   Thrombocytopenia Assessment &  Plan: Check a cbc regularly   Nocturia -     Urinalysis, Routine w reflex microscopic; Future -     Urine Culture; Future    Assessment and Plan Assessment & Plan Parkinson's Disease Increased symptoms including gait difficulties, delusions, hallucinations, and impaired daily activities. Sleep disturbances and nocturia noted. Current medication includes carbidopa-levodopa, mirtazapine, and clonazepam. Environmental modifications made for safety. - Perform urinalysis to rule out UTI. -  Schedule virtual follow-up in 8-12 weeks for lab review and management discussion. - Evaluate and manage sleep disturbances and nocturia if persistent. - Coordinate with neurologist Dr. Arbutus Leas for ongoing management.  Sleep Disturbance Difficulty staying asleep, waking early, compounded by nocturia. Current medications may contribute to issues. - Evaluate sleep patterns and adjust nighttime medication regimen if necessary.  Mobility and Safety Concerns Difficulty with mobility and resistance to walker use. Environmental modifications made. Potential need for hospital bed noted. - Consider further environmental modifications or assistive devices. - Discuss potential need for hospital bed if mobility issues persist.     Danise Edge, MD

## 2023-07-09 ENCOUNTER — Other Ambulatory Visit (INDEPENDENT_AMBULATORY_CARE_PROVIDER_SITE_OTHER)

## 2023-07-09 ENCOUNTER — Encounter: Payer: Self-pay | Admitting: Family Medicine

## 2023-07-09 DIAGNOSIS — R351 Nocturia: Secondary | ICD-10-CM | POA: Diagnosis not present

## 2023-07-09 DIAGNOSIS — R739 Hyperglycemia, unspecified: Secondary | ICD-10-CM

## 2023-07-09 DIAGNOSIS — I10 Essential (primary) hypertension: Secondary | ICD-10-CM | POA: Diagnosis not present

## 2023-07-09 DIAGNOSIS — E785 Hyperlipidemia, unspecified: Secondary | ICD-10-CM | POA: Diagnosis not present

## 2023-07-09 LAB — LIPID PANEL
Cholesterol: 94 mg/dL (ref 0–200)
HDL: 48.4 mg/dL (ref >39.00)
LDL Cholesterol: 25 mg/dL (ref 0–99)
NonHDL: 45.45
Total CHOL/HDL Ratio: 2
Triglycerides: 100 mg/dL (ref 0.0–149.0)
VLDL: 20 mg/dL (ref 0.0–40.0)

## 2023-07-09 LAB — URINALYSIS, ROUTINE W REFLEX MICROSCOPIC
Bilirubin Urine: NEGATIVE
Hgb urine dipstick: NEGATIVE
Leukocytes,Ua: NEGATIVE
Nitrite: NEGATIVE
RBC / HPF: NONE SEEN (ref 0–?)
Specific Gravity, Urine: 1.015 (ref 1.000–1.030)
Urine Glucose: NEGATIVE
Urobilinogen, UA: 0.2 (ref 0.0–1.0)
pH: 6 (ref 5.0–8.0)

## 2023-07-09 LAB — CBC WITH DIFFERENTIAL/PLATELET
Basophils Absolute: 0 10*3/uL (ref 0.0–0.1)
Basophils Relative: 0.7 % (ref 0.0–3.0)
Eosinophils Absolute: 0.1 10*3/uL (ref 0.0–0.7)
Eosinophils Relative: 0.9 % (ref 0.0–5.0)
HCT: 43.8 % (ref 39.0–52.0)
Hemoglobin: 14.7 g/dL (ref 13.0–17.0)
Lymphocytes Relative: 33.2 % (ref 12.0–46.0)
Lymphs Abs: 2.3 10*3/uL (ref 0.7–4.0)
MCHC: 33.6 g/dL (ref 30.0–36.0)
MCV: 94.2 fl (ref 78.0–100.0)
Monocytes Absolute: 0.5 10*3/uL (ref 0.1–1.0)
Monocytes Relative: 7.8 % (ref 3.0–12.0)
Neutro Abs: 4 10*3/uL (ref 1.4–7.7)
Neutrophils Relative %: 57.4 % (ref 43.0–77.0)
Platelets: 145 10*3/uL — ABNORMAL LOW (ref 150.0–400.0)
RBC: 4.65 Mil/uL (ref 4.22–5.81)
RDW: 13.7 % (ref 11.5–15.5)
WBC: 6.9 10*3/uL (ref 4.0–10.5)

## 2023-07-09 LAB — HEMOGLOBIN A1C: Hgb A1c MFr Bld: 6 % (ref 4.6–6.5)

## 2023-07-09 LAB — COMPREHENSIVE METABOLIC PANEL WITH GFR
ALT: 8 U/L (ref 0–53)
AST: 18 U/L (ref 0–37)
Albumin: 4.4 g/dL (ref 3.5–5.2)
Alkaline Phosphatase: 74 U/L (ref 39–117)
BUN: 23 mg/dL (ref 6–23)
CO2: 26 meq/L (ref 19–32)
Calcium: 9.6 mg/dL (ref 8.4–10.5)
Chloride: 106 meq/L (ref 96–112)
Creatinine, Ser: 1.78 mg/dL — ABNORMAL HIGH (ref 0.40–1.50)
GFR: 35.67 mL/min — ABNORMAL LOW (ref >60.00)
Glucose, Bld: 114 mg/dL — ABNORMAL HIGH (ref 70–99)
Potassium: 4.3 meq/L (ref 3.5–5.1)
Sodium: 141 meq/L (ref 135–145)
Total Bilirubin: 0.8 mg/dL (ref 0.2–1.2)
Total Protein: 6.2 g/dL (ref 6.0–8.3)

## 2023-07-09 LAB — TSH: TSH: 2.45 u[IU]/mL (ref 0.35–5.50)

## 2023-07-10 LAB — URINE CULTURE
MICRO NUMBER:: 16302693
Result:: NO GROWTH
SPECIMEN QUALITY:: ADEQUATE

## 2023-07-11 DIAGNOSIS — C641 Malignant neoplasm of right kidney, except renal pelvis: Secondary | ICD-10-CM | POA: Diagnosis not present

## 2023-07-11 DIAGNOSIS — R7989 Other specified abnormal findings of blood chemistry: Secondary | ICD-10-CM | POA: Diagnosis not present

## 2023-07-11 DIAGNOSIS — R351 Nocturia: Secondary | ICD-10-CM | POA: Diagnosis not present

## 2023-07-14 ENCOUNTER — Encounter: Payer: Self-pay | Admitting: Neurology

## 2023-07-16 ENCOUNTER — Other Ambulatory Visit: Payer: Self-pay | Admitting: Neurology

## 2023-07-16 ENCOUNTER — Other Ambulatory Visit (HOSPITAL_BASED_OUTPATIENT_CLINIC_OR_DEPARTMENT_OTHER): Payer: Self-pay

## 2023-07-16 DIAGNOSIS — G20A1 Parkinson's disease without dyskinesia, without mention of fluctuations: Secondary | ICD-10-CM

## 2023-07-16 MED ORDER — DIVALPROEX SODIUM ER 250 MG PO TB24
250.0000 mg | ORAL_TABLET | Freq: Every day | ORAL | 0 refills | Status: DC
  Filled 2023-07-16: qty 90, 90d supply, fill #0

## 2023-07-17 ENCOUNTER — Other Ambulatory Visit: Payer: Self-pay

## 2023-07-23 NOTE — Progress Notes (Unsigned)
 Virtual Visit Via Video       Consent was obtained for video visit:  {yes no:314532} Answered questions that patient had about telehealth interaction:  {yes no:314532} I discussed the limitations, risks, security and privacy concerns of performing an evaluation and management service by telemedicine. I also discussed with the patient that there may be a patient responsible charge related to this service. The patient expressed understanding and agreed to proceed.  Pt location: Home in [place]  Physician Location: office Name of referring provider:  Neda Balk, MD I connected with Vincent Walker at patients initiation/request on 07/25/2023 at  9:15 AM EDT by video enabled telemedicine application and verified that I am speaking with the correct person using two identifiers. Pt MRN:  [id number] Pt DOB:  [birth year] Video Participants:  Vincent Walker;  ***  Assessment/Plan:   1.  Parkinsons Disease  -Continue carbidopa /levodopa  25/100 IR, 2 at 8am/2 at 11am/2 at 2pm/1 at 6pm.  -Continue carbidopa /levodopa  50/200 at bedtime  -discussed walker at all times  -RX PT 2.  RBD/RLS/insomnia  -Continue clonazepam  0.5 mg, full tablet at bedtime.  Tried to stop this medication and things got much worse, especially mood change and daytime hypersomnolence did not get better.  Lower dosages resulted in increasing RBD symptoms.  PDMP is reviewed.  Refilled today.  -He is using Inbrija  for this as well.  -on mirtazapine , 15 mg q hs.    -some of night issues due to nocturia.  Needs to f/u with PCP/urology 3.  Anxiety/depression  -Being followed by primary care.  On Depakote  ER, 250 mg daily.  May need to increase this in the future.  Nighttime agitation is becoming more problematic 4.  Daytime hypersomnolence  -Nocturnal polysomnogram was negative, but he was awake most of the night so not sure that it was accurate 5.  Mild PDD  -Neurocognitive testing done in August, 2024 with evidence of mild  PDD, mostly because of functional impairment.  -Has declined Nuplazid thus far  - We can start donepezil  which sometimes can help early delusions and hallucinations.  If that does not help, we can have another discussion about Nuplazid.  -encouraged counseling for wife as caregiving burden increases.  Much of the discussion centered around respite care for the wife as well as bringing in care for the patient. 6.  AM dizziness  -don't think related to Parkinsons Disease meds as it starts in the AM prior to taking meds and doesn't persist in the day  -he has d/c the amlodipine   -He is on low-dose atenolol  for PVCs.  They have decreased the dose of time.  They don't think that they can decrease further d/t pvc's.  BP is not too low.     Subjective:   Vincent Walker was seen today in follow up for Parkinsons disease.  My previous records were reviewed prior to todays visit as well as outside records available to me. Pt with wife who supplements hx. he was worked in today because of confusion and delusions.  Patient saw primary care the week before that and had lab work.  Urine culture was negative.  His serum creatinine was elevated from his baseline at 1.78 (baseline approximately 1.4).  Current prescribed movement disorder medications: carbidopa /levodopa  25/100 , 2 at 8am/2 at 11am/2 at 2pm/1 at 6pm Clonazepam  0.5 mg, 1 tablet at bedtime  carbidopa /levodopa  50/200 at bedtime Mirtazapine , 15 mg nightly Depakote , 250 mg daily Inbrija  (started last visit)  PREVIOUS MEDICATIONS: Carbidopa /levodopa   25/100 IR; carbidopa /levodopa  25/100 CR (tried to change to see if would help EDS and didn't so changed back)  ALLERGIES:   Allergies  Allergen Reactions   Nsaids    Sulfa Antibiotics Other (See Comments)    Had a reaction as a child   Tolmetin Other (See Comments)    CURRENT MEDICATIONS:  Outpatient Encounter Medications as of 07/25/2023  Medication Sig   acetaminophen  (TYLENOL ) 500 MG tablet  Take 1,000 mg by mouth every 6 (six) hours as needed (pain.).    atenolol  (TENORMIN ) 25 MG tablet Take 1 tablet (25 mg total) by mouth every evening. (Patient taking differently: Take 12.5 mg by mouth daily.)   atorvastatin  (LIPITOR) 20 MG tablet Take 1 tablet (20 mg total) by mouth daily.   Brinzolamide -Brimonidine  (SIMBRINZA ) 1-0.2 % SUSP Place 1 drop into both eyes 3 (three) times daily.   carbidopa -levodopa  (SINEMET  CR) 50-200 MG tablet Take 1 tablet by mouth at bedtime.   carbidopa -levodopa  (SINEMET  IR) 25-100 MG tablet Take 2 tablets by mouth at 8am, 2 tablets at 11am, 2 tablets at 2pm, and 1 tablet at 6pm.   clonazePAM  (KLONOPIN ) 0.5 MG tablet Take 1 tablet (0.5 mg total) by mouth at bedtime.   divalproex  (DEPAKOTE  ER) 250 MG 24 hr tablet Take 1 tablet (250 mg total) by mouth daily.   ketoconazole  (NIZORAL ) 2 % shampoo Use every other day, applying directly to damp scalp, lather, let sit 5-10 minutes and rinse.   Levodopa  (INBRIJA ) 42 MG CAPS TWO capsules is ONE dosage (never inhale just one capsule). You can inhale the capsules as needed up to 5 times per day, separated by 2 hour intervals.   mirtazapine  (REMERON ) 15 MG tablet Take 1 tablet (15 mg total) by mouth at bedtime.   Multiple Vitamins-Minerals (PRESERVISION AREDS 2 PO) Take 1 tablet by mouth daily.   Polyethyl Glycol-Propyl Glycol (LUBRICANT EYE DROPS) 0.4-0.3 % SOLN Place 1 drop into both eyes 3 (three) times daily as needed (dry/irritated eyes.).   No facility-administered encounter medications on file as of 07/25/2023.    Objective:   PHYSICAL EXAMINATION:    VITALS:   There were no vitals filed for this visit.   Wt Readings from Last 3 Encounters:  07/08/23 207 lb 12.8 oz (94.3 kg)  02/25/23 207 lb 9.6 oz (94.2 kg)  08/16/22 207 lb 9.6 oz (94.2 kg)   GEN:  The patient appears stated age and is in NAD. HEENT:  Normocephalic, atraumatic.  The mucous membranes are moist. The superficial temporal arteries are without  ropiness or tenderness. CV:  RRR Lungs:  CTAB Neck/HEME:  There are no carotid bruits bilaterally.  Neurological examination:  Orientation: The patient is alert and oriented x3 but looks to wife for finer aspects Cranial nerves: There is good facial symmetry with facial hypomimia. The speech is fluent and clear. Soft palate rises symmetrically and there is no tongue deviation. Hearing is intact to conversational tone. Sensation: Sensation is intact to light touch throughout Motor: Strength is at least antigravity x4.  Movement examination: Tone: There is normal tone today in the upper and lower extremities. Abnormal movements: there is no tremor today Coordination:  There is no significant decremation with the exception of toe taps on the L Gait and Station: The patient pushes off to arise.  He doesn't shuffle today and actually turns well today.  No freezing today.    I have reviewed and interpreted the following labs independently    Chemistry  Component Value Date/Time   NA 141 07/09/2023 1018   K 4.3 07/09/2023 1018   CL 106 07/09/2023 1018   CO2 26 07/09/2023 1018   BUN 23 07/09/2023 1018   CREATININE 1.78 (H) 07/09/2023 1018      Component Value Date/Time   CALCIUM  9.6 07/09/2023 1018   ALKPHOS 74 07/09/2023 1018   AST 18 07/09/2023 1018   ALT 8 07/09/2023 1018   BILITOT 0.8 07/09/2023 1018       Lab Results  Component Value Date   WBC 6.9 07/09/2023   HGB 14.7 07/09/2023   HCT 43.8 07/09/2023   MCV 94.2 07/09/2023   PLT 145.0 (L) 07/09/2023    Lab Results  Component Value Date   TSH 2.45 07/09/2023     Total time spent on today's visit was *** minutes, including both face-to-face time and nonface-to-face time.  Time included that spent on review of records (prior notes available to me/labs/imaging if pertinent), discussing treatment and goals, answering patient's questions and coordinating care.  Cc:  Vincent Balk, MD

## 2023-07-25 ENCOUNTER — Telehealth (INDEPENDENT_AMBULATORY_CARE_PROVIDER_SITE_OTHER): Admitting: Neurology

## 2023-07-25 ENCOUNTER — Other Ambulatory Visit (HOSPITAL_BASED_OUTPATIENT_CLINIC_OR_DEPARTMENT_OTHER): Payer: Self-pay

## 2023-07-25 DIAGNOSIS — F02A11 Dementia in other diseases classified elsewhere, mild, with agitation: Secondary | ICD-10-CM | POA: Diagnosis not present

## 2023-07-25 DIAGNOSIS — G20A1 Parkinson's disease without dyskinesia, without mention of fluctuations: Secondary | ICD-10-CM

## 2023-07-25 MED ORDER — DONEPEZIL HCL 5 MG PO TABS
5.0000 mg | ORAL_TABLET | Freq: Every day | ORAL | 0 refills | Status: DC
  Filled 2023-07-25: qty 90, 90d supply, fill #0

## 2023-07-29 ENCOUNTER — Other Ambulatory Visit (HOSPITAL_BASED_OUTPATIENT_CLINIC_OR_DEPARTMENT_OTHER): Payer: Self-pay

## 2023-08-01 ENCOUNTER — Other Ambulatory Visit (HOSPITAL_BASED_OUTPATIENT_CLINIC_OR_DEPARTMENT_OTHER): Payer: Self-pay

## 2023-08-01 ENCOUNTER — Other Ambulatory Visit: Payer: Self-pay | Admitting: Family Medicine

## 2023-08-01 MED ORDER — ATORVASTATIN CALCIUM 20 MG PO TABS
20.0000 mg | ORAL_TABLET | Freq: Every day | ORAL | 3 refills | Status: AC
  Filled 2023-08-01: qty 90, 90d supply, fill #0
  Filled 2023-10-28: qty 90, 90d supply, fill #1
  Filled 2024-01-24: qty 90, 90d supply, fill #2
  Filled 2024-04-20: qty 90, 90d supply, fill #3

## 2023-08-07 ENCOUNTER — Encounter: Payer: Self-pay | Admitting: Family Medicine

## 2023-08-08 ENCOUNTER — Other Ambulatory Visit: Payer: Self-pay

## 2023-08-08 ENCOUNTER — Other Ambulatory Visit (INDEPENDENT_AMBULATORY_CARE_PROVIDER_SITE_OTHER)

## 2023-08-08 DIAGNOSIS — R350 Frequency of micturition: Secondary | ICD-10-CM

## 2023-08-08 NOTE — Telephone Encounter (Signed)
 Patient was schedule for lab appointment today, 08/08/23

## 2023-08-09 LAB — URINALYSIS
Bilirubin Urine: NEGATIVE
Hgb urine dipstick: NEGATIVE
Leukocytes,Ua: NEGATIVE
Nitrite: NEGATIVE
Specific Gravity, Urine: 1.02 (ref 1.000–1.030)
Total Protein, Urine: NEGATIVE
Urine Glucose: NEGATIVE
Urobilinogen, UA: 1 (ref 0.0–1.0)
pH: 6 (ref 5.0–8.0)

## 2023-08-09 LAB — URINE CULTURE
MICRO NUMBER:: 16430820
Result:: NO GROWTH
SPECIMEN QUALITY:: ADEQUATE

## 2023-08-11 ENCOUNTER — Encounter: Payer: Self-pay | Admitting: Family

## 2023-08-20 ENCOUNTER — Other Ambulatory Visit (HOSPITAL_BASED_OUTPATIENT_CLINIC_OR_DEPARTMENT_OTHER): Payer: Self-pay

## 2023-08-20 ENCOUNTER — Other Ambulatory Visit: Payer: Self-pay | Admitting: Neurology

## 2023-08-20 MED ORDER — CLONAZEPAM 0.5 MG PO TABS
0.5000 mg | ORAL_TABLET | Freq: Every day | ORAL | 5 refills | Status: DC
  Filled 2023-08-20: qty 30, 30d supply, fill #0
  Filled 2023-09-18: qty 30, 30d supply, fill #1
  Filled 2023-10-16: qty 30, 30d supply, fill #2
  Filled 2023-11-18: qty 30, 30d supply, fill #3
  Filled 2023-12-16: qty 30, 30d supply, fill #4
  Filled 2024-01-12: qty 30, 30d supply, fill #5

## 2023-08-21 ENCOUNTER — Other Ambulatory Visit: Payer: Self-pay

## 2023-08-29 ENCOUNTER — Ambulatory Visit: Payer: Medicare Other | Admitting: Neurology

## 2023-09-03 ENCOUNTER — Other Ambulatory Visit (HOSPITAL_BASED_OUTPATIENT_CLINIC_OR_DEPARTMENT_OTHER): Payer: Self-pay

## 2023-09-03 ENCOUNTER — Other Ambulatory Visit: Payer: Self-pay | Admitting: Neurology

## 2023-09-03 DIAGNOSIS — G2581 Restless legs syndrome: Secondary | ICD-10-CM

## 2023-09-03 DIAGNOSIS — G20A1 Parkinson's disease without dyskinesia, without mention of fluctuations: Secondary | ICD-10-CM

## 2023-09-03 MED ORDER — MIRTAZAPINE 15 MG PO TABS
15.0000 mg | ORAL_TABLET | Freq: Every day | ORAL | 0 refills | Status: DC
  Filled 2023-09-03: qty 90, 90d supply, fill #0

## 2023-09-07 NOTE — Assessment & Plan Note (Signed)
 Hydrate and monitor

## 2023-09-07 NOTE — Assessment & Plan Note (Signed)
 Seeing [facility] in August

## 2023-09-07 NOTE — Assessment & Plan Note (Signed)
 Mild, asymptomatic.

## 2023-09-07 NOTE — Assessment & Plan Note (Signed)
 Well controlled, no changes to meds. Encouraged heart healthy diet such as the DASH diet and exercise as tolerated.

## 2023-09-10 ENCOUNTER — Encounter: Payer: Self-pay | Admitting: Family Medicine

## 2023-09-10 ENCOUNTER — Other Ambulatory Visit (HOSPITAL_BASED_OUTPATIENT_CLINIC_OR_DEPARTMENT_OTHER): Payer: Self-pay

## 2023-09-10 ENCOUNTER — Telehealth (INDEPENDENT_AMBULATORY_CARE_PROVIDER_SITE_OTHER): Admitting: Family Medicine

## 2023-09-10 VITALS — BP 130/82 | HR 70 | Ht 75.0 in

## 2023-09-10 DIAGNOSIS — N183 Chronic kidney disease, stage 3 unspecified: Secondary | ICD-10-CM | POA: Diagnosis not present

## 2023-09-10 DIAGNOSIS — M79672 Pain in left foot: Secondary | ICD-10-CM | POA: Diagnosis not present

## 2023-09-10 DIAGNOSIS — I10 Essential (primary) hypertension: Secondary | ICD-10-CM | POA: Diagnosis not present

## 2023-09-10 DIAGNOSIS — D696 Thrombocytopenia, unspecified: Secondary | ICD-10-CM | POA: Diagnosis not present

## 2023-09-10 DIAGNOSIS — G20A1 Parkinson's disease without dyskinesia, without mention of fluctuations: Secondary | ICD-10-CM

## 2023-09-10 MED ORDER — DONEPEZIL HCL 10 MG PO TABS
10.0000 mg | ORAL_TABLET | Freq: Every day | ORAL | 2 refills | Status: DC
  Filled 2023-09-10: qty 30, 30d supply, fill #0
  Filled 2023-10-03: qty 30, 30d supply, fill #1
  Filled 2023-11-04: qty 30, 30d supply, fill #2

## 2023-09-10 NOTE — Progress Notes (Signed)
 MyChart Video Visit    Virtual Visit via Video Note   This patient is at least at moderate risk for complications without adequate follow up. This format is felt to be most appropriate for this patient at this time. Physical exam was limited by quality of the video and audio technology used for the visit. Porsha, CMA was able to get the patient set up on a video visit.  Patient location: home Patient and provider in visit Provider location: Office  I discussed the limitations of evaluation and management by telemedicine and the availability of in person appointments. The patient expressed understanding and agreed to proceed.  Visit Date: 09/10/2023  Today's healthcare provider: Randie Bustle, MD     Subjective:    Patient ID: Vincent Walker, male    DOB: January 06, 1946, 78 y.o.   MRN: [id number]  Chief Complaint  Patient presents with   Medical Management of Chronic Issues    Patient presents today for a 2 month follow-up.   Quality Metric Gaps    AWV    HPI Discussed the use of AI scribe software for clinical note transcription with the patient, who gave verbal consent to proceed.  History of Present Illness Vincent Walker is an 78 year old male with Parkinson's disease who presents with worsening hallucinations and mobility issues.  Over the past six months, he has experienced a significant decline in walking ability, cognitive function, and energy levels. Severe hallucinations and delusions began approximately two months ago. He is currently on Inbrija  and carbidopa -levodopa  for Parkinson's disease. His caregiver reports that Inbrija  can only be used once every two hours, and he is cautious not to administer doses too closely together. Occasionally, he requires an additional dose of carbidopa -levodopa  from a previous prescription to manage symptoms. His nighttime medication regimen has been adjusted to facilitate getting him to bed, as he sometimes struggles to walk at  night.  His extended-release carbidopa -levodopa  is not lasting through the night, causing him to wake up early, sometimes as early as 5 AM. His caregiver notes that he wakes up even earlier but tries to stay in bed to avoid waking her. This disruption in sleep is affecting both of their routines.  Regarding hydration and nutrition, he has been drinking more fluids, including Gatorade and water, since his last visit. He also consumes coffee and decaffeinated Coke. His caregiver has been encouraging him to drink more due to his single kidney, and he has been following a regimen of drinking small amounts of fluids regularly throughout the day to maintain hydration and potentially improve symptoms.  He has a history of left foot surgery performed six or seven years ago, where a screw was placed to address issues with his toes. He is now experiencing pain and difficulty walking on hard surfaces, possibly due to the migration of the screw. He has developed a callus on the bottom of his foot, which exacerbates the pain. The original orthopedic surgeon is no longer available, and he is seeking a new orthopedic consultation.  He experiences chronic constipation related to Parkinson's disease, which is managed with prune juice and milk of magnesia. His caregiver is adjusting the doses to find a balance that prevents diarrhea while effectively managing constipation.  No recent fevers, burning during urination, or emergency room visits.    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 Required nasal packing a couple of days ago.  Had a similar episode of epistaxis a little over a year ago.  At that visit, I could not see the exact bleeding spot. EXAMINATION after packing removal reveals several excoriated areas anteriorly but I was unable to see t   Essential hypertension 12/25/2011   Eustachian tube dysfunction, left 02/21/2021   Flank pain 07/19/2022   History of blood transfusion 2010   After Kidney surgery   History of chicken pox    History of renal cell carcinoma 02/08/2015   diagnosed and removed in 2010 Right Monitored by Dr Kristeen Peto of urology at [facility] Dr Glenn Lange, nephrology at [facility]   Hyperglycemia 12/23/2015   Increased thyroid  stimulating hormone (TSH) level 06/07/2017   Ingrown left big toenail 02/14/2015   Left foot pain 02/14/2015   MVP (mitral valve prolapse) 05/14/2016   Parkinson's disease (HCC) 12/23/2015   Posterior vitreous detachment of both eyes 08/21/2022   Primary open-angle glaucoma, left eye, severe stage 05/05/2021   Primary open-angle glaucoma, right eye, moderate stage 01/10/2016   Pseudophakia, both eyes 08/21/2022   Right hip pain 12/12/2016   Stage 3 chronic kidney disease 12/25/2011   Thrombocytopenia 02/08/2015   Tremor 02/14/2015    Past Surgical History:  Procedure Laterality Date   APPENDECTOMY  1995   CATARACT EXTRACTION, BILATERAL     COLONOSCOPY     COLONOSCOPY WITH PROPOFOL  N/A 12/17/2018   Procedure: COLONOSCOPY WITH PROPOFOL ;  Surgeon: Normie Becton., MD;  Location: [facility] ENDOSCOPY;  Service: Gastroenterology;  Laterality: N/A;   ENDOSCOPIC MUCOSAL RESECTION N/A 12/17/2018   Procedure: ENDOSCOPIC MUCOSAL RESECTION;  Surgeon: Brice Campi Albino Alu., MD;  Location: [facility] ENDOSCOPY;  Service: Gastroenterology;  Laterality: N/A;   HEMOSTASIS CLIP PLACEMENT  12/17/2018   Procedure: HEMOSTASIS CLIP  PLACEMENT;  Surgeon: Normie Becton., MD;  Location: [facility] ENDOSCOPY;  Service: Gastroenterology;;   left knee scope  2003   POLYPECTOMY  12/17/2018   Procedure: POLYPECTOMY;  Surgeon: Mansouraty, Albino Alu., MD;  Location: [facility] ENDOSCOPY;  Service: Gastroenterology;;   right knee scope  1999   SUBMUCOSAL LIFTING INJECTION  12/17/2018   Procedure: SUBMUCOSAL LIFTING INJECTION;  Surgeon: Normie Becton., MD;  Location: [facility] ENDOSCOPY;  Service: Gastroenterology;;   TOE SURGERY Left    metal 2nd toe- and top of foot- straighten bone   TONSILLECTOMY     TOTAL KNEE ARTHROPLASTY Left 07/06/2014   TOTAL NEPHRECTOMY Right     Family History  Problem Relation Age of Onset   Heart attack Mother    Stroke Mother        swelling in brain stem   Glaucoma Mother    Alcohol abuse Father    Hyperlipidemia Father    Hypertension Father    Diabetes Father    Heart disease Father    Healthy Daughter    Healthy Daughter    Healthy Son    Cancer Maternal Aunt        lung cancer   Cancer Paternal Uncle        bone cancer  Colon cancer Neg Hx    Esophageal cancer Neg Hx    Stomach cancer Neg Hx    Inflammatory bowel disease Neg Hx    Liver disease Neg Hx    Pancreatic cancer Neg Hx    Rectal cancer Neg Hx     Social History   Socioeconomic History   Marital status: Married    Spouse name: Not on file   Number of children: 3   Years of education: 16   Highest education level: Bachelor's degree (e.g., BA, AB, BS)  Occupational History   Occupation: Retired    CommentHydrologist in [employer]  Tobacco Use   Smoking status: Former    Types: Pipe    Quit date: 11/18/1995    Years since quitting: 27.8   Smokeless tobacco: Never   Tobacco comments:    stopped 20 years ago.  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, no major dietary restrictions   Other Topics Concern   Not on file  Social History Narrative   Right Handed   Lives in a one story home   Drinks one cup of coffee a day   Social Drivers of Health   Financial Resource Strain: Low Risk  (07/06/2023)   Overall Financial Resource Strain (CARDIA)    Difficulty of Paying Living Expenses: Not hard at all  Food Insecurity: No Food Insecurity (07/06/2023)   Hunger Vital Sign    Worried About Running Out of Food in the Last Year: Never true    Ran Out of Food in the Last Year: Never true  Transportation Needs: No Transportation Needs (07/06/2023)   PRAPARE - Administrator, Civil Service (Medical): No    Lack of Transportation (Non-Medical): No  Physical Activity: Unknown (07/06/2023)   Exercise Vital Sign    Days of Exercise per Week: 0 days    Minutes of Exercise per Session: Not on file  Stress: No Stress Concern Present (07/06/2023)   [employer] of Occupational Health - Occupational Stress Questionnaire    Feeling of Stress : Only a little  Social Connections: Unknown (07/06/2023)   Social Connection and Isolation Panel [NHANES]    Frequency of Communication with Friends and Family: Once a week    Frequency of Social Gatherings with Friends and Family: Patient declined    Attends Religious Services: Never    Database administrator or Organizations: No    Attends Engineer, structural: Not on file    Marital Status: Married  Catering manager Violence: Not At Risk (11/27/2021)   Humiliation, Afraid, Rape, and Kick questionnaire    Fear of Current or Ex-Partner: No    Emotionally Abused: No    Physically Abused: No    Sexually Abused: No    Outpatient Medications Prior to Visit  Medication Sig Dispense Refill   acetaminophen  (TYLENOL ) 500 MG tablet Take 1,000 mg by mouth every 6 (six) hours as needed (pain.).      atenolol  (TENORMIN ) 25 MG tablet Take 1 tablet (25 mg total) by mouth every  evening. (Patient taking differently: Take 12.5 mg by mouth daily.) 90 tablet 3   atorvastatin  (LIPITOR) 20 MG tablet Take 1 tablet (20 mg total) by mouth daily. 90 tablet 3   Brinzolamide -Brimonidine  (  SIMBRINZA ) 1-0.2 % SUSP Place 1 drop into both eyes 3 (three) times daily. 8 mL 11   carbidopa -levodopa  (SINEMET  CR) 50-200 MG tablet Take 1 tablet by mouth at bedtime. 90 tablet 0   carbidopa -levodopa  (SINEMET  IR) 25-100 MG tablet Take 2 tablets by mouth at 8am, 2 tablets at 11am, 2 tablets at 2pm, and 1 tablet at 6pm. 630 tablet 0   clonazePAM  (KLONOPIN ) 0.5 MG tablet Take 1 tablet (0.5 mg total) by mouth at bedtime. 30 tablet 5   divalproex  (DEPAKOTE  ER) 250 MG 24 hr tablet Take 1 tablet (250 mg total) by mouth daily. 90 tablet 0   ketoconazole  (NIZORAL ) 2 % shampoo Use every other day, applying directly to damp scalp, lather, let sit 5-10 minutes and rinse. 120 mL 3   Levodopa  (INBRIJA ) 42 MG CAPS TWO capsules is ONE dosage (never inhale just one capsule). You can inhale the capsules as needed up to 5 times per day, separated by 2 hour intervals. 300 capsule 5   mirtazapine  (REMERON ) 15 MG tablet Take 1 tablet (15 mg total) by mouth at bedtime. 90 tablet 0   Multiple Vitamins-Minerals (PRESERVISION AREDS 2 PO) Take 1 tablet by mouth daily.     Polyethyl Glycol-Propyl Glycol (LUBRICANT EYE DROPS) 0.4-0.3 % SOLN Place 1 drop into both eyes 3 (three) times daily as needed (dry/irritated eyes.).     donepezil  (ARICEPT ) 5 MG tablet Take 1 tablet (5 mg total) by mouth at bedtime. 90 tablet 0   No facility-administered medications prior to visit.    Allergies  Allergen Reactions   Nsaids    Sulfa Antibiotics Other (See Comments)    Had a reaction as a child   Tolmetin Other (See Comments)    Review of Systems  Constitutional:  Positive for malaise/fatigue. Negative for fever.  HENT:  Negative for congestion.   Eyes:  Negative for blurred vision.  Respiratory:  Negative for shortness of  breath.   Cardiovascular:  Negative for chest pain, palpitations and leg swelling.  Gastrointestinal:  Positive for constipation. Negative for abdominal pain, blood in stool and nausea.  Genitourinary:  Negative for dysuria and frequency.  Musculoskeletal:  Negative for falls.  Skin:  Negative for rash.  Neurological:  Positive for tremors. Negative for dizziness, loss of consciousness and headaches.  Endo/Heme/Allergies:  Negative for environmental allergies.  Psychiatric/Behavioral:  Positive for hallucinations. Negative for depression. The patient is nervous/anxious and has insomnia.        Objective:     Physical Exam Constitutional:      General: He is not in acute distress.    Appearance: Normal appearance. He is not ill-appearing or toxic-appearing.  HENT:     Head: Normocephalic and atraumatic.     Right Ear: External ear normal.     Left Ear: External ear normal.     Nose: Nose normal.  Eyes:     General:        Right eye: No discharge.        Left eye: No discharge.  Pulmonary:     Effort: Pulmonary effort is normal.  Skin:    Findings: No rash.  Neurological:     Mental Status: He is alert and oriented to person, place, and time.  Psychiatric:        Behavior: Behavior normal.     BP 130/82   Pulse 70   Ht 6\' 3"  (1.905 m)   SpO2 98%   BMI 25.97 kg/m  Wt Readings from  Last 3 Encounters:  07/08/23 207 lb 12.8 oz (94.3 kg)  02/25/23 207 lb 9.6 oz (94.2 kg)  08/16/22 207 lb 9.6 oz (94.2 kg)       Assessment & Plan:  Essential hypertension Assessment & Plan: Well controlled, no changes to meds. Encouraged heart healthy diet such as the DASH diet and exercise as tolerated.     Parkinson's disease, unspecified whether dyskinesia present, unspecified whether manifestations fluctuate [facility]) Assessment & Plan: Seeing [facility] in August   Stage 3 chronic kidney disease, unspecified whether stage 3a or 3b CKD (HCC) Assessment & Plan: Hydrate and  monitor  Orders: -     Comprehensive metabolic panel with GFR; Future  Thrombocytopenia Assessment & Plan: Mild, asymptomatic   Left foot pain -     DG Foot Complete Left; Future -     Ambulatory referral to Orthopedic Surgery  Other orders -     Donepezil  HCl; Take 1 tablet (10 mg total) by mouth at bedtime.  Dispense: 30 tablet; Refill: 2     Assessment and Plan Assessment & Plan Hallucinations and Delusions Severe hallucinations and delusions likely related to Parkinson's disease and dementia. - Increase donepezil  to 10 mg to improve cognitive symptoms. - Consider magnesium glycinate 200-400 mg at bedtime for sleep and muscle relaxation.  Parkinson's Disease Worsening symptoms affecting mobility and sleep. Extended-release carbidopa -levodopa  not lasting through the night. - Administer carbidopa -levodopa  extended-release earlier in the evening. - Ensure carbidopa -levodopa  immediate-release is available as needed. - Monitor Inbrija  inhalation timing and frequency. - Encourage fluid intake of 5-10 ounces every 1-2 hours, reduce in the evening.  Foot Pain due to Possible Screw Migration Severe left foot pain possibly due to screw migration from previous surgery. - Order x-ray of the left foot to assess screw migration. - Refer to orthopedic specialist for evaluation and potential revision.  Nocturia Frequent nocturia likely related to single kidney function and hydration status. - Encourage fluid intake of 5-10 ounces every 1-2 hours, reduce in the evening. - Balance fluid intake with two-thirds water and one-third Gatorade. - Monitor nocturia frequency and adjust fluid intake as needed.  Constipation Constipation related to Parkinson's disease. Previous treatments caused diarrhea, indicating need for dosage adjustment. - Adjust milk of magnesia and prune juice mixture to prevent constipation without causing diarrhea. - Consider using half doses or adjusting  quantities.  Follow-up Plans discussed to monitor health aspects and treatment efficacy. - Arrange follow-up in 8-12 weeks. - Schedule lab work to recheck creatinine levels. - Coordinate x-ray and lab work on the same day. - Ensure referral to orthopedics is completed.     I discussed the assessment and treatment plan with the patient. The patient was provided an opportunity to ask questions and all were answered. The patient agreed with the plan and demonstrated an understanding of the instructions.   The patient was advised to call back or seek an in-person evaluation if the symptoms worsen or if the condition fails to improve as anticipated.  Randie Bustle, MD [facility] [phone] (phone) [phone] (fax)  [facility]

## 2023-09-11 ENCOUNTER — Other Ambulatory Visit (INDEPENDENT_AMBULATORY_CARE_PROVIDER_SITE_OTHER)

## 2023-09-11 ENCOUNTER — Ambulatory Visit (HOSPITAL_BASED_OUTPATIENT_CLINIC_OR_DEPARTMENT_OTHER)
Admission: RE | Admit: 2023-09-11 | Discharge: 2023-09-11 | Disposition: A | Discharge status: Home / Self Care | Source: Ambulatory Visit | Attending: Family Medicine | Admitting: Family Medicine

## 2023-09-11 DIAGNOSIS — M79672 Pain in left foot: Secondary | ICD-10-CM | POA: Diagnosis not present

## 2023-09-11 DIAGNOSIS — M19072 Primary osteoarthritis, left ankle and foot: Secondary | ICD-10-CM | POA: Diagnosis not present

## 2023-09-11 DIAGNOSIS — N183 Chronic kidney disease, stage 3 unspecified: Secondary | ICD-10-CM

## 2023-09-11 DIAGNOSIS — M2012 Hallux valgus (acquired), left foot: Secondary | ICD-10-CM | POA: Diagnosis not present

## 2023-09-11 NOTE — Addendum Note (Signed)
 Addended by: Marigene Shoulder on: 09/11/2023 07:33 AM   Modules accepted: Orders

## 2023-09-12 ENCOUNTER — Ambulatory Visit: Payer: Self-pay | Admitting: Family Medicine

## 2023-09-12 LAB — COMPREHENSIVE METABOLIC PANEL WITH GFR
AG Ratio: 2.2 (calc) (ref 1.0–2.5)
ALT: 7 U/L — ABNORMAL LOW (ref 9–46)
AST: 19 U/L (ref 10–35)
Albumin: 4.3 g/dL (ref 3.6–5.1)
Alkaline phosphatase (APISO): 79 U/L (ref 35–144)
BUN/Creatinine Ratio: 13 (calc) (ref 6–22)
BUN: 19 mg/dL (ref 7–25)
CO2: 25 mmol/L (ref 20–32)
Calcium: 9.9 mg/dL (ref 8.6–10.3)
Chloride: 104 mmol/L (ref 98–110)
Creat: 1.49 mg/dL — ABNORMAL HIGH (ref 0.70–1.22)
Globulin: 2 g/dL (ref 1.9–3.7)
Glucose, Bld: 123 mg/dL — ABNORMAL HIGH (ref 65–99)
Potassium: 4.3 mmol/L (ref 3.5–5.3)
Sodium: 141 mmol/L (ref 135–146)
Total Bilirubin: 0.8 mg/dL (ref 0.2–1.2)
Total Protein: 6.3 g/dL (ref 6.1–8.1)
eGFR: 47 mL/min/{1.73_m2} — ABNORMAL LOW (ref >=60)

## 2023-09-15 ENCOUNTER — Other Ambulatory Visit: Payer: Self-pay

## 2023-09-15 ENCOUNTER — Emergency Department (HOSPITAL_BASED_OUTPATIENT_CLINIC_OR_DEPARTMENT_OTHER)

## 2023-09-15 ENCOUNTER — Emergency Department (HOSPITAL_BASED_OUTPATIENT_CLINIC_OR_DEPARTMENT_OTHER)
Admission: EM | Admit: 2023-09-15 | Discharge: 2023-09-15 | Disposition: A | Discharge status: Home / Self Care | Attending: Emergency Medicine | Admitting: Emergency Medicine

## 2023-09-15 ENCOUNTER — Encounter (HOSPITAL_BASED_OUTPATIENT_CLINIC_OR_DEPARTMENT_OTHER): Payer: Self-pay | Admitting: *Deleted

## 2023-09-15 DIAGNOSIS — Y92019 Unspecified place in single-family (private) house as the place of occurrence of the external cause: Secondary | ICD-10-CM | POA: Diagnosis not present

## 2023-09-15 DIAGNOSIS — N183 Chronic kidney disease, stage 3 unspecified: Secondary | ICD-10-CM | POA: Diagnosis not present

## 2023-09-15 DIAGNOSIS — M25511 Pain in right shoulder: Secondary | ICD-10-CM | POA: Insufficient documentation

## 2023-09-15 DIAGNOSIS — S99922A Unspecified injury of left foot, initial encounter: Secondary | ICD-10-CM | POA: Diagnosis present

## 2023-09-15 DIAGNOSIS — G20C Parkinsonism, unspecified: Secondary | ICD-10-CM | POA: Diagnosis not present

## 2023-09-15 DIAGNOSIS — M2012 Hallux valgus (acquired), left foot: Secondary | ICD-10-CM | POA: Diagnosis not present

## 2023-09-15 DIAGNOSIS — L03116 Cellulitis of left lower limb: Secondary | ICD-10-CM | POA: Diagnosis not present

## 2023-09-15 DIAGNOSIS — I129 Hypertensive chronic kidney disease with stage 1 through stage 4 chronic kidney disease, or unspecified chronic kidney disease: Secondary | ICD-10-CM | POA: Diagnosis not present

## 2023-09-15 DIAGNOSIS — S90822A Blister (nonthermal), left foot, initial encounter: Secondary | ICD-10-CM | POA: Diagnosis not present

## 2023-09-15 DIAGNOSIS — D72829 Elevated white blood cell count, unspecified: Secondary | ICD-10-CM | POA: Insufficient documentation

## 2023-09-15 DIAGNOSIS — Z79899 Other long term (current) drug therapy: Secondary | ICD-10-CM | POA: Diagnosis not present

## 2023-09-15 DIAGNOSIS — S4991XA Unspecified injury of right shoulder and upper arm, initial encounter: Secondary | ICD-10-CM | POA: Diagnosis not present

## 2023-09-15 DIAGNOSIS — M47812 Spondylosis without myelopathy or radiculopathy, cervical region: Secondary | ICD-10-CM | POA: Diagnosis not present

## 2023-09-15 DIAGNOSIS — M19011 Primary osteoarthritis, right shoulder: Secondary | ICD-10-CM | POA: Diagnosis not present

## 2023-09-15 DIAGNOSIS — W19XXXA Unspecified fall, initial encounter: Secondary | ICD-10-CM | POA: Insufficient documentation

## 2023-09-15 DIAGNOSIS — Z85528 Personal history of other malignant neoplasm of kidney: Secondary | ICD-10-CM | POA: Insufficient documentation

## 2023-09-15 DIAGNOSIS — Z043 Encounter for examination and observation following other accident: Secondary | ICD-10-CM | POA: Diagnosis not present

## 2023-09-15 DIAGNOSIS — M79672 Pain in left foot: Secondary | ICD-10-CM | POA: Diagnosis not present

## 2023-09-15 LAB — CBC WITH DIFFERENTIAL/PLATELET
Abs Immature Granulocytes: 0.04 10*3/uL (ref 0.00–0.07)
Basophils Absolute: 0 10*3/uL (ref 0.0–0.1)
Basophils Relative: 0 %
Eosinophils Absolute: 0.1 10*3/uL (ref 0.0–0.5)
Eosinophils Relative: 1 %
HCT: 42.3 % (ref 39.0–52.0)
Hemoglobin: 14.2 g/dL (ref 13.0–17.0)
Immature Granulocytes: 0 %
Lymphocytes Relative: 25 %
Lymphs Abs: 2.7 10*3/uL (ref 0.7–4.0)
MCH: 31.3 pg (ref 26.0–34.0)
MCHC: 33.6 g/dL (ref 30.0–36.0)
MCV: 93.4 fL (ref 80.0–100.0)
Monocytes Absolute: 1.2 10*3/uL — ABNORMAL HIGH (ref 0.1–1.0)
Monocytes Relative: 11 %
Neutro Abs: 6.7 10*3/uL (ref 1.7–7.7)
Neutrophils Relative %: 63 %
Platelets: 115 10*3/uL — ABNORMAL LOW (ref 150–400)
RBC: 4.53 MIL/uL (ref 4.22–5.81)
RDW: 13 % (ref 11.5–15.5)
WBC: 10.7 10*3/uL — ABNORMAL HIGH (ref 4.0–10.5)
nRBC: 0 % (ref 0.0–0.2)

## 2023-09-15 LAB — COMPREHENSIVE METABOLIC PANEL WITH GFR
ALT: 6 U/L (ref 0–44)
AST: 23 U/L (ref 15–41)
Albumin: 4.3 g/dL (ref 3.5–5.0)
Alkaline Phosphatase: 103 U/L (ref 38–126)
Anion gap: 12 (ref 5–15)
BUN: 21 mg/dL (ref 8–23)
CO2: 23 mmol/L (ref 22–32)
Calcium: 9.3 mg/dL (ref 8.9–10.3)
Chloride: 105 mmol/L (ref 98–111)
Creatinine, Ser: 1.51 mg/dL — ABNORMAL HIGH (ref 0.61–1.24)
GFR, Estimated: 46 mL/min — ABNORMAL LOW (ref >60)
Glucose, Bld: 123 mg/dL — ABNORMAL HIGH (ref 70–99)
Potassium: 4.2 mmol/L (ref 3.5–5.1)
Sodium: 139 mmol/L (ref 135–145)
Total Bilirubin: 0.4 mg/dL (ref 0.0–1.2)
Total Protein: 6.4 g/dL — ABNORMAL LOW (ref 6.5–8.1)

## 2023-09-15 MED ORDER — CEPHALEXIN 500 MG PO CAPS: 500.0000 mg | ORAL_CAPSULE | Freq: Four times a day (QID) | ORAL | 0 refills | Status: DC

## 2023-09-15 NOTE — ED Provider Notes (Signed)
 [facility] [place] [facility] AT [facility] [place] Provider Note   CSN: [id number] Arrival date & time: 09/15/23  1306     Patient presents with: Foot Pain   Vincent Walker is a 78 y.o. male.  Patient with past history significant for Parkinson disease, CKD stage III, history of renal cell carcinoma, hypertension, dyslipidemia, mitral valve prolapse presents the emergency department with concerns of foot pain.  On his way here, he sustained a mechanical fall due to some instability when he fell over his walker.  There are some pain to the right shoulder.  Does report possible head impact denies loss of consciousness.  No reported scalp laceration, headache, nausea, or vomiting since the fall.  Regarding patient's main initial concern regarding foot, he notes that over the last 2 days, has had worsening foot pain of the left foot which has previously had surgical interventions with fixation with screws.  He reports that he is also noticing increased redness and a callus to the bottom of the left foot.  Denies any fever, chills or bodyaches.   Foot Pain       Prior to Admission medications   Medication Sig Start Date End Date Taking? Authorizing Provider  cephALEXin  (KEFLEX ) 500 MG capsule Take 1 capsule (500 mg total) by mouth 4 (four) times daily for 5 days. 09/15/23 09/20/23 Yes Berlie Hatchel A, PA-C  acetaminophen  (TYLENOL ) 500 MG tablet Take 1,000 mg by mouth every 6 (six) hours as needed (pain.).     [provider]  atenolol  (TENORMIN ) 25 MG tablet Take 1 tablet (25 mg total) by mouth every evening. Patient taking differently: Take 12.5 mg by mouth daily. 10/19/22 10/19/23  Saguier, Gaylin Ke, PA-C  atorvastatin  (LIPITOR) 20 MG tablet Take 1 tablet (20 mg total) by mouth daily. 08/01/23   Neda Balk, MD  Brinzolamide -Brimonidine  (SIMBRINZA ) 1-0.2 % SUSP Place 1 drop into both eyes 3 (three) times daily. 03/08/23     carbidopa -levodopa  (SINEMET  CR) 50-200 MG tablet Take 1  tablet by mouth at bedtime. 07/08/23 07/07/24  Tat, Von Grumbling, DO  carbidopa -levodopa  (SINEMET  IR) 25-100 MG tablet Take 2 tablets by mouth at 8am, 2 tablets at 11am, 2 tablets at 2pm, and 1 tablet at 6pm. 06/24/23   Tat, Von Grumbling, DO  clonazePAM  (KLONOPIN ) 0.5 MG tablet Take 1 tablet (0.5 mg total) by mouth at bedtime. 08/20/23   Tat, Von Grumbling, DO  divalproex  (DEPAKOTE  ER) 250 MG 24 hr tablet Take 1 tablet (250 mg total) by mouth daily. 07/16/23 07/15/24  Tat, Von Grumbling, DO  donepezil  (ARICEPT ) 10 MG tablet Take 1 tablet (10 mg total) by mouth at bedtime. 09/10/23   Neda Balk, MD  ketoconazole  (NIZORAL ) 2 % shampoo Use every other day, applying directly to damp scalp, lather, let sit 5-10 minutes and rinse. 05/02/23   Neda Balk, MD  Levodopa  (INBRIJA ) 42 MG CAPS TWO capsules is ONE dosage (never inhale just one capsule). You can inhale the capsules as needed up to 5 times per day, separated by 2 hour intervals. 09/03/22   Tat, Von Grumbling, DO  mirtazapine  (REMERON ) 15 MG tablet Take 1 tablet (15 mg total) by mouth at bedtime. 09/03/23   Tat, Von Grumbling, DO  Multiple Vitamins-Minerals (PRESERVISION AREDS 2 PO) Take 1 tablet by mouth daily.    [provider]  Polyethyl Glycol-Propyl Glycol (LUBRICANT EYE DROPS) 0.4-0.3 % SOLN Place 1 drop into both eyes 3 (three) times daily as needed (dry/irritated eyes.).  [provider]    Allergies: Nsaids, Sulfa antibiotics, and Tolmetin    Review of Systems  Musculoskeletal:        Shoulder pain  Skin:  Positive for color change.  All other systems reviewed and are negative.   Updated Vital Signs BP 117/67 (BP Location: Right Arm)   Pulse 70   Temp 97.8 F (36.6 C) (Oral)   Resp 16   SpO2 98%   Physical Exam Vitals and nursing note reviewed.  Constitutional:      General: He is not in acute distress.    Appearance: He is well-developed.  HENT:     Head: Normocephalic and atraumatic.   Eyes:     Conjunctiva/sclera:  Conjunctivae normal.    Cardiovascular:     Rate and Rhythm: Normal rate and regular rhythm.     Heart sounds: No murmur heard. Pulmonary:     Effort: Pulmonary effort is normal. No respiratory distress.     Breath sounds: Normal breath sounds.  Abdominal:     Palpations: Abdomen is soft.     Tenderness: There is no abdominal tenderness.   Musculoskeletal:        General: Tenderness present. No swelling, deformity or signs of injury.     Cervical back: Neck supple.     Comments: Right shoulder slight tenderness to palpation along the anterior aspect of the lateral right shoulder with no obvious clavicle tenderness.  Left foot with tenderness primarily to the plantar surface.  Range of motion of the ankle is unremarkable.   Skin:    General: Skin is warm and dry.     Capillary Refill: Capillary refill takes less than 2 seconds.     Findings: Erythema and lesion present.     Comments: Mild erythema with slight warmth to the distal portion of the left foot on the dorsal and plantar surfaces.  A small blister appears to have developed with slight fluctuance and what appears to be blood underneath the skin.   Neurological:     Mental Status: He is alert.   Psychiatric:        Mood and Affect: Mood normal.        (all labs ordered are listed, but only abnormal results are displayed) Labs Reviewed  CBC WITH DIFFERENTIAL/PLATELET - Abnormal; Notable for the following components:      Result Value   WBC 10.7 (*)    Platelets 115 (*)    Monocytes Absolute 1.2 (*)    All other components within normal limits  COMPREHENSIVE METABOLIC PANEL WITH GFR - Abnormal; Notable for the following components:   Glucose, Bld 123 (*)    Creatinine, Ser 1.51 (*)    Total Protein 6.4 (*)    GFR, Estimated 46 (*)    All other components within normal limits    EKG: None  Radiology: DG Foot Complete Left Result Date: 09/15/2023 CLINICAL DATA:  Left foot pain for 2 days.  Fall today. EXAM:  LEFT FOOT - COMPLETE 3+ VIEW COMPARISON:  Left foot radiographs 09/11/2023 FINDINGS: No significant change from prior. There is mild hallux valgus. Postsurgical changes of second and third PIP arthrodesis with headless screws. Redemonstration of dorsal approach screws within the third and fourth metatarsal heads. Moderate plantar calcaneal heel spur. No acute fracture or dislocation. IMPRESSION: 1. No significant change from prior.  No acute fracture. 2. Mild hallux valgus. 3. Postsurgical changes of second and third PIP arthrodesis. Electronically Signed   By: Bertina Broccoli  M.D.   On: 09/15/2023 14:48   DG Shoulder Right Result Date: 09/15/2023 CLINICAL DATA:  Fall in kitchen striking right side. EXAM: RIGHT SHOULDER - 2+ VIEW COMPARISON:  None Available. FINDINGS: Mild high-grade clavicular head peripheral callus formation and bone hypertrophy. Likely associated ossification along the coracoclavicular ligament, likely the sequela of remote trauma. The humeral head appears to be mildly high-riding which may be secondary to a superior rotator cuff tear. Mild anterior and posterior glenoid degenerative spurring. No acute fracture is seen. No dislocation. The visualized portion of the right lung is unremarkable. IMPRESSION: 1. No acute fracture. 2. Bone hypertrophy likely from remote trauma to the distal clavicle. Heterotopic bone formation likely from remote trauma to the coracoclavicular ligament. 3. Mild high-riding humeral head which may be secondary to a superior rotator cuff tear. 4. Mild glenohumeral osteoarthritis. Electronically Signed   By: Bertina Broccoli M.D.   On: 09/15/2023 14:46   CT Cervical Spine Wo Contrast Result Date: 09/15/2023 CLINICAL DATA:  Marvell Slider, hit right-side EXAM: CT CERVICAL SPINE WITHOUT CONTRAST TECHNIQUE: Multidetector CT imaging of the cervical spine was performed without intravenous contrast. Multiplanar CT image reconstructions were also generated. RADIATION DOSE REDUCTION: This  exam was performed according to the departmental dose-optimization program which includes automated exposure control, adjustment of the mA and/or kV according to patient size and/or use of iterative reconstruction technique. COMPARISON:  None Available. FINDINGS: Alignment: Slight reversal of cervical lordosis due to multilevel degenerative changes. Otherwise alignment is anatomic. Skull base and vertebrae: No acute fracture. No primary bone lesion or focal pathologic process. Soft tissues and spinal canal: No prevertebral fluid or swelling. No visible canal hematoma. Disc levels: Severe spondylosis at C4-5, C5-6, and C6-7. Multilevel facet hypertrophy greatest at C3-4. Upper chest: Airway is patent. Visualized portions of the lung apices are clear. Other: None. IMPRESSION: 1. No acute cervical spine fracture. 2. Extensive multilevel cervical degenerative changes. Electronically Signed   By: Bobbye Burrow M.D.   On: 09/15/2023 13:59   CT Head Wo Contrast Result Date: 09/15/2023 CLINICAL DATA:  Marvell Slider, hit right side EXAM: CT HEAD WITHOUT CONTRAST TECHNIQUE: Contiguous axial images were obtained from the base of the skull through the vertex without intravenous contrast. RADIATION DOSE REDUCTION: This exam was performed according to the departmental dose-optimization program which includes automated exposure control, adjustment of the mA and/or kV according to patient size and/or use of iterative reconstruction technique. COMPARISON:  None Available. FINDINGS: Brain: No acute infarct or hemorrhage. Lateral ventricles and midline structures are unremarkable. No acute extra-axial fluid collections. No mass effect. Vascular: No hyperdense vessel or unexpected calcification. Skull: Normal. Negative for fracture or focal lesion. Sinuses/Orbits: No acute finding. Other: None. IMPRESSION: 1. No acute intracranial process. Electronically Signed   By: Bobbye Burrow M.D.   On: 09/15/2023 13:57    Procedures    Medications Ordered in the ED - No data to display                                Medical Decision Making Amount and/or Complexity of Data Reviewed Labs: ordered. Radiology: ordered.  Risk Prescription drug management.   This patient presents to the ED for concern of fall, foot pain.  Differential diagnosis includes concussion, head injury, cellulitis, osteomyelitis, abscess   Lab Tests:  I Ordered, and personally interpreted labs.  The pertinent results include: CBC with mild leukocytosis at 10.7, CMP shows renal dysfunction consistent with baseline  Imaging Studies ordered:  I ordered imaging studies including CT head, CT cervical spine, x-ray of the right shoulder, x-ray of the left foot I independently visualized and interpreted imaging which showed CT imaging unremarkable, x-ray of the right shoulder unremarkable with any acute findings, x-ray of the left foot shows surgical screws still in fixed position with no other acute finding seen. I agree with the radiologist interpretation   Problem List / ED Course:  Patient with past history significant for Parkinson disease, CKD stage III, history of renal cell carcinoma, hypertension, dyslipidemia, mitral valve prolapse presents ED with concerns of foot pain.  Reportedly had a mechanical fall at home prior to arriving in which he struck his right shoulder and possible head strike but not on blood thinners.  This is outside of the concern regarding his foot.  Imaging ordered from triage regarding these concerns.  Specifically with concerns to the foot, has noted some increasing pain to the bottom of the left foot with what appears to be a callus present.  Also worsening some balance and swelling to the foot but denies any fever, chills or bodyaches. On exam, head and neck are unremarkable.  Right shoulder unremarkable with some mild point tenderness towards the distal clavicle but no obvious deformity seen.  The left foot has some  notable erythema and mild swelling seen but no drainage present.  Refer to images above.  Will order add-on labs and x-ray imaging of the shoulder and the foot for further assessment possible osteomyelitis versus cellulitis versus other. Workup is reassuring.  No significant changes seen on x-ray imaging of the foot with concerns of osteomyelitis.  White count is slightly elevated at 10.7 which could be secondary to cellulitis.  With the erythema and warmth present, start patient on a course of antibiotic therapy.  Keflex  initiated given lack of any drainage present.  Advise close follow-up with orthopedics as currently planned for this coming week.  Will hold off on deroofing the blister on the bottom of the foot given close proximity of infection of the skin on the dorsum of the foot.  Advised use of postoperative boot for additional foot support.  Encouraged rest and weightbearing as tolerated.  Otherwise stable at this time for outpatient follow-up and discharged home.  Final diagnoses:  Cellulitis of left foot  Blister of left foot, initial encounter    ED Discharge Orders          Ordered    cephALEXin  (KEFLEX ) 500 MG capsule  4 times daily        09/15/23 1604               Taleah Bellantoni A, PA-C 09/15/23 2103    Mozell Arias, MD 09/16/23 [phone]

## 2023-09-15 NOTE — Discharge Instructions (Addendum)
 You were seen in the ER today for concerns of foot pain and a fall. Your CT imaging of your head and neck were negative for any signs of injury. Your foot xray shows that the screws from your prior surgery are all in place. I suspect the callus you have developed is due to changes in your walking due to pain for other reasons. Your right shoulder xray was normal from a new injury standpoint. Your labs showed a slight elevation in your white blood count which could be due to infection in the skin of the left foot called cellulitis. I am starting an antibiotic called Keflex  which you will be taking for the next 5 days. Follow up with orthopedics for further evaluation of your foot this coming week.

## 2023-09-15 NOTE — ED Triage Notes (Addendum)
 left foot pain for two days. Pt has a callus on the bottom of his  left foot which he has had for several months which his wife thinks is related to a screw from surgery. Pt was going to [facility] to have this pain on his left foot pain evaluated when he fell in the kitchen and struck his right side.  No LOC.  Not on any blood thinners.  Pt is alert and oriented.

## 2023-09-15 NOTE — ED Notes (Signed)
 Post op shoe applied. Reviewed discharge instructions, follow up and medications sent to pharmacy. Pt and wife states understanding. Pt assisted via transport chair to vehicle

## 2023-09-16 ENCOUNTER — Other Ambulatory Visit: Payer: Self-pay

## 2023-09-16 ENCOUNTER — Encounter: Payer: Self-pay | Admitting: Neurology

## 2023-09-16 ENCOUNTER — Encounter: Payer: Self-pay | Admitting: Family Medicine

## 2023-09-16 DIAGNOSIS — F02A11 Dementia in other diseases classified elsewhere, mild, with agitation: Secondary | ICD-10-CM

## 2023-09-16 DIAGNOSIS — R4189 Other symptoms and signs involving cognitive functions and awareness: Secondary | ICD-10-CM

## 2023-09-16 DIAGNOSIS — G20A1 Parkinson's disease without dyskinesia, without mention of fluctuations: Secondary | ICD-10-CM

## 2023-09-16 NOTE — Telephone Encounter (Signed)
 Pt's wife called in stating she does want a referral to be sent to [facility]

## 2023-09-16 NOTE — Progress Notes (Signed)
 Subjective:     Patient ID: Vincent Walker, male    DOB: May 01, 1945, 78 y.o.   MRN: [id number]     HPI    Chief Complaint  Patient presents with   Acute Visit    Patient presents today for left foot pain/ swelling. He was seen in [facility] on 09/15/23 and wife believes it has worsen since that visit.   Vincent Walker is an 78 yo male patient presents for ED follow-up following left foot cellulitis.  He was seen on 09/15/2023 and was prescribed Keflex  500 mg 4 times a day.   Vincent Walker has a Psx left foot surgery approximately 7 years ago- Sx site 2nd toe - and top of foot- straighten bone. He is scheduled to follow up with Orthopedics on Friday. His wife notes he has had a callus has been present on the plantar aspect of the left foot since his foot surgery; however,  blister began forming this past Sunday and has progressively increased in size. She reports worsening and spreading redness and swelling since their recent ED visit. Patient states that it is painful to walk on the foot.  He is currently in wheelchair, wife reports occasional use of walker in the home, but with current infection it has been immensely difficult.   Vincent Walker was recently referred by Neurology to [facility].  Patient and wife report ongoing mobility concerns. She report  recent falls and progressive difficulty with transfers and ambulation. He now requires assistance from 1-2 people for transfers to appointments. His son has been helping, though support is limited due to his full-time work schedule.  Patient denies fever, chills, SOB, CP, palpitations, dyspnea, edema, HA, vision changes, N/V/D, abdominal pain, urinary symptoms, rash, weight changes, wound drainage.                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 Required nasal packing a couple of days ago.  Had a similar episode of epistaxis a little over a year ago.  At that visit, I could not see the exact bleeding spot. EXAMINATION after packing removal reveals several excoriated areas anteriorly but I was unable to see t   Essential hypertension 12/25/2011   Eustachian tube dysfunction, left 02/21/2021   Flank pain 07/19/2022   History of blood transfusion 2010   After Kidney surgery   History of chicken pox    History of renal cell carcinoma 02/08/2015   diagnosed and removed in 2010 Right Monitored by Dr Tanda Moats of urology at [facility] Dr Claudine Alley, nephrology at [facility]   Hyperglycemia 12/23/2015   Increased thyroid  stimulating hormone (TSH) level 06/07/2017   Ingrown left big toenail 02/14/2015   Left foot pain 02/14/2015   MVP (mitral valve prolapse) 05/14/2016   Parkinson's disease (HCC) 12/23/2015   Posterior vitreous  detachment of both eyes 08/21/2022   Primary open-angle glaucoma, left eye, severe stage 05/05/2021   Primary open-angle glaucoma, right eye, moderate stage 01/10/2016   Pseudophakia, both eyes 08/21/2022   Right hip pain 12/12/2016   Stage 3 chronic kidney disease 12/25/2011   Thrombocytopenia 02/08/2015   Tremor 02/14/2015    Past Surgical History:  Procedure Laterality Date   APPENDECTOMY  1995   CATARACT EXTRACTION, BILATERAL     COLONOSCOPY     COLONOSCOPY WITH PROPOFOL  N/A 12/17/2018   Procedure: COLONOSCOPY WITH PROPOFOL ;  Surgeon:  Wilhelmenia Aloha Raddle., MD;  Location: [facility] ENDOSCOPY;  Service: Gastroenterology;  Laterality: N/A;   ENDOSCOPIC MUCOSAL RESECTION N/A 12/17/2018   Procedure: ENDOSCOPIC MUCOSAL RESECTION;  Surgeon: Wilhelmenia Aloha Raddle., MD;  Location: [facility] ENDOSCOPY;  Service: Gastroenterology;  Laterality: N/A;   HEMOSTASIS CLIP PLACEMENT  12/17/2018   Procedure: HEMOSTASIS CLIP PLACEMENT;  Surgeon: Wilhelmenia Aloha Raddle., MD;  Location: [facility] ENDOSCOPY;  Service: Gastroenterology;;   left knee scope  2003   POLYPECTOMY  12/17/2018   Procedure: POLYPECTOMY;  Surgeon: Wilhelmenia Aloha Raddle., MD;  Location: [facility] ENDOSCOPY;  Service: Gastroenterology;;   right knee scope  1999   SUBMUCOSAL LIFTING INJECTION  12/17/2018   Procedure: SUBMUCOSAL LIFTING INJECTION;  Surgeon: Wilhelmenia Aloha Raddle., MD;  Location: [facility] ENDOSCOPY;  Service: Gastroenterology;;   TOE SURGERY Left    metal 2nd toe- and top of foot- straighten bone   TONSILLECTOMY     TOTAL KNEE ARTHROPLASTY Left 07/06/2014   TOTAL NEPHRECTOMY Right     Family History  Problem Relation Age of Onset   Heart attack Mother    Stroke Mother        swelling in brain stem   Glaucoma Mother    Alcohol abuse Father    Hyperlipidemia Father    Hypertension Father    Diabetes Father    Heart disease Father    Healthy Daughter    Healthy Daughter    Healthy Son    Cancer Maternal Aunt        lung cancer   Cancer Paternal Uncle        bone cancer   Colon cancer Neg Hx    Esophageal cancer Neg Hx    Stomach cancer Neg Hx    Inflammatory bowel disease Neg Hx    Liver disease Neg Hx    Pancreatic cancer Neg Hx    Rectal cancer Neg Hx     Social History   Socioeconomic History   Marital status: Married    Spouse name: Not on file   Number of children: 3   Years of education: 16   Highest education level: Bachelor's degree (e.g., BA, AB, BS)  Occupational History   Occupation: Retired    CommentHydrologist in [employer]  Tobacco Use    Smoking status: Former    Types: Pipe    Quit date: 11/18/1995    Years since quitting: 27.8   Smokeless tobacco: Never   Tobacco comments:    stopped 20 years ago.  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, no major dietary restrictions   Other Topics Concern   Not on file  Social History Narrative   Right Handed   Lives in a one story home  Drinks one cup of coffee a day   Social Drivers of Corporate investment banker Strain: Low Risk  (07/06/2023)   Overall Financial Resource Strain (CARDIA)    Difficulty of Paying Living Expenses: Not hard at all  Food Insecurity: No Food Insecurity (07/06/2023)   Hunger Vital Sign    Worried About Running Out of Food in the Last Year: Never true    Ran Out of Food in the Last Year: Never true  Transportation Needs: No Transportation Needs (07/06/2023)   PRAPARE - Administrator, Civil Service (Medical): No    Lack of Transportation (Non-Medical): No  Physical Activity: Unknown (07/06/2023)   Exercise Vital Sign    Days of Exercise per Week: 0 days    Minutes of Exercise per Session: Not on file  Stress: No Stress Concern Present (07/06/2023)   [employer] of Occupational Health - Occupational Stress Questionnaire    Feeling of Stress : Only a little  Social Connections: Unknown (07/06/2023)   Social Connection and Isolation Panel    Frequency of Communication with Friends and Family: Once a week    Frequency of Social Gatherings with Friends and Family: Patient declined    Attends Religious Services: Never    Database administrator or Organizations: No    Attends Engineer, structural: Not on file    Marital Status: Married  Catering manager Violence: Not At Risk (11/27/2021)   Humiliation, Afraid, Rape, and Kick questionnaire    Fear of  Current or Ex-Partner: No    Emotionally Abused: No    Physically Abused: No    Sexually Abused: No    Outpatient Medications Prior to Visit  Medication Sig Dispense Refill   acetaminophen  (TYLENOL ) 500 MG tablet Take 1,000 mg by mouth every 6 (six) hours as needed (pain.).      atenolol  (TENORMIN ) 25 MG tablet Take 1 tablet (25 mg total) by mouth every evening. (Patient taking differently: Take 12.5 mg by mouth daily.) 90 tablet 3   atorvastatin  (LIPITOR) 20 MG tablet Take 1 tablet (20 mg total) by mouth daily. 90 tablet 3   Brinzolamide -Brimonidine  (SIMBRINZA ) 1-0.2 % SUSP Place 1 drop into both eyes 3 (three) times daily. 8 mL 11   carbidopa -levodopa  (SINEMET  CR) 50-200 MG tablet Take 1 tablet by mouth at bedtime. 90 tablet 0   carbidopa -levodopa  (SINEMET  IR) 25-100 MG tablet Take 2 tablets by mouth at 8am, 2 tablets at 11am, 2 tablets at 2pm, and 1 tablet at 6pm. 630 tablet 0   clonazePAM  (KLONOPIN ) 0.5 MG tablet Take 1 tablet (0.5 mg total) by mouth at bedtime. 30 tablet 5   divalproex  (DEPAKOTE  ER) 250 MG 24 hr tablet Take 1 tablet (250 mg total) by mouth daily. 90 tablet 0   donepezil  (ARICEPT ) 10 MG tablet Take 1 tablet (10 mg total) by mouth at bedtime. 30 tablet 2   ketoconazole  (NIZORAL ) 2 % shampoo Use every other day, applying directly to damp scalp, lather, let sit 5-10 minutes and rinse. 120 mL 3   Levodopa  (INBRIJA ) 42 MG CAPS TWO capsules is ONE dosage (never inhale just one capsule). You can inhale the capsules as needed up to 5 times per day, separated by 2 hour intervals. 300 capsule 5   mirtazapine  (REMERON ) 15 MG tablet Take 1 tablet (15 mg total) by mouth at bedtime. 90 tablet 0   Multiple Vitamins-Minerals (PRESERVISION AREDS 2 PO) Take 1 tablet by mouth daily.  Polyethyl Glycol-Propyl Glycol (LUBRICANT EYE DROPS) 0.4-0.3 % SOLN Place 1 drop into both eyes 3 (three) times daily as needed (dry/irritated eyes.).     cephALEXin  (KEFLEX ) 500 MG capsule Take 1 capsule (500  mg total) by mouth 4 (four) times daily for 5 days. 20 capsule 0   No facility-administered medications prior to visit.    Allergies  Allergen Reactions   Nsaids    Sulfa Antibiotics Other (See Comments)    Had a reaction as a child   Tolmetin Other (See Comments)    ROS    See HPI Objective:    Physical Exam  Physical Exam Vitals and nursing note reviewed.  Constitutional:      General: He is not in acute distress.    Appearance: He is well-developed.  HENT:     Head: Normocephalic and atraumatic.  Eyes:     Conjunctiva/sclera: Conjunctivae normal.  Cardiovascular:     Rate and Rhythm: Normal rate and regular rhythm.   LLE edema- +3, RLE- slightly edema Pulmonary:     Effort: Pulmonary effort is normal. No respiratory distress.     Breath sounds: Normal breath sounds.  Abdominal:     Palpations: Abdomen is soft.     Tenderness: There is no abdominal tenderness.  Musculoskeletal:        General: Tenderness present. No deformity.    Cervical back: Neck supple.  Left foot with tenderness-plantar surface.  ROM- WNL   Skin:    General: Skin is warm and dry.     Capillary Refill: Capillary refill takes less than 2 seconds.   Comments: Left foot: erythema w/ warmth of  dorsal and plantar surfaces.  Erythema and lesion present.  + large blister appears to be blood underneath the skin.  L & R--DP pulses +1 Neurological:     Mental Status: He is alert.  Psychiatric:        Mood and Affect: Mood normal.   BP 132/78   Pulse 60   Temp 97.7 F (36.5 C)   Ht 6' 3 (1.905 m)   SpO2 98%   BMI 25.97 kg/m  Wt Readings from Last 3 Encounters:  07/08/23 207 lb 12.8 oz (94.3 kg)  02/25/23 207 lb 9.6 oz (94.2 kg)  08/16/22 207 lb 9.6 oz (94.2 kg)       Assessment & Plan:   Problem List Items Addressed This Visit     Cellulitis of left foot - Primary   Discontinued Keflex  and initiated doxycycline  100 mg twice daily for 10 days for MRSA coverage. Repeat CBC and CMP  were ordered to monitor for systemic infection. The patient is scheduled to follow up with Orthopedics on Friday. Advised follow-up in 2-3 days if symptoms worsen or fail to improve. If WBC continues to rise or symptoms escalate,may require IV antibiotics-asked with patient and wife and they understand if this is the case he should be taken to the emergency department.  Results pending.  Advised  elevating leg on pillows, rest, and weightbearing as tolerated. Education provided  on signs of worsening infection, including increased redness, swelling, drainage, fever, or systemic symptoms, seek emergency care if these occur.      Relevant Medications   doxycycline  (VIBRAMYCIN ) 100 MG capsule   Other Relevant Orders   CBC with Differential/Platelet   Comprehensive metabolic panel with GFR   Pt and wife voiced understanding and agreement to the plan.  I have discontinued Vincent RAMAN. Rode's cephALEXin . I am also having him  start on doxycycline . Additionally, I am having him maintain his acetaminophen , Lubricant Eye Drops, Multiple Vitamins-Minerals (PRESERVISION AREDS 2 PO), Inbrija , atenolol , Simbrinza , ketoconazole , carbidopa -levodopa , carbidopa -levodopa , divalproex , atorvastatin , clonazePAM , mirtazapine , and donepezil .  Meds ordered this encounter  Medications   doxycycline  (VIBRAMYCIN ) 100 MG capsule    Sig: Take 1 capsule (100 mg total) by mouth 2 (two) times daily for 10 days.    Dispense:  20 capsule    Refill:  0    Supervising Provider:   DOMENICA BLACKBIRD A [[id number]]

## 2023-09-17 ENCOUNTER — Telehealth: Payer: Self-pay

## 2023-09-17 ENCOUNTER — Other Ambulatory Visit (HOSPITAL_BASED_OUTPATIENT_CLINIC_OR_DEPARTMENT_OTHER): Payer: Self-pay

## 2023-09-17 ENCOUNTER — Ambulatory Visit (INDEPENDENT_AMBULATORY_CARE_PROVIDER_SITE_OTHER): Admitting: Student

## 2023-09-17 ENCOUNTER — Encounter: Payer: Self-pay | Admitting: Student

## 2023-09-17 VITALS — BP 132/78 | HR 60 | Temp 97.7°F | Ht 75.0 in

## 2023-09-17 DIAGNOSIS — L03116 Cellulitis of left lower limb: Secondary | ICD-10-CM | POA: Insufficient documentation

## 2023-09-17 MED ORDER — DOXYCYCLINE HYCLATE 100 MG PO CAPS
100.0000 mg | ORAL_CAPSULE | Freq: Two times a day (BID) | ORAL | 0 refills | Status: AC
  Filled 2023-09-17: qty 20, 10d supply, fill #0

## 2023-09-17 NOTE — Progress Notes (Signed)
 Complex Care Management Note  Care Guide Note 09/17/2023 Name: Vincent Walker MRN: [id number] DOB: 10-Oct-1945  [name] is a 78 y.o. year old male who sees Neda Balk, MD for primary care. I reached out to [name] by phone today to offer complex care management services.  Mr. Wuertz was given information about Complex Care Management services today including:   The Complex Care Management services include support from the care team which includes your Nurse Care Manager, Clinical Social Worker, or Pharmacist.  The Complex Care Management team is here to help remove barriers to the health concerns and goals most important to you. Complex Care Management services are voluntary, and the patient may decline or stop services at any time by request to their care team member.   Complex Care Management Consent Status: Patient agreed to services and verbal consent obtained.   Follow up plan:  Telephone appointment with complex care management team member scheduled for:  09-25-23  Encounter Outcome:  Patient Scheduled   Creola Doheny [facility], [facility] Guide  Direct Dial: [phone]  Fax [phone]

## 2023-09-17 NOTE — Progress Notes (Signed)
 Complex Care Management Note  Care Guide Note 09/17/2023 Name: Vincent Walker MRN: [id number] DOB: [birth year]  Vincent Walker is a 78 y.o. year old male who sees Neda Balk, MD for primary care. I reached out to Vincent Walker by phone today to offer complex care management services.  Vincent Walker was given information about Complex Care Management services today including:   The Complex Care Management services include support from the care team which includes your Nurse Care Manager, Clinical Social Worker, or Pharmacist.  The Complex Care Management team is here to help remove barriers to the health concerns and goals most important to you. Complex Care Management services are voluntary, and the patient may decline or stop services at any time by request to their care team member.   Complex Care Management Consent Status: Patient agreed to services and verbal consent obtained.   Follow up plan:  Telephone appointment with complex care management team member scheduled for:  09-25-23  Encounter Outcome:  Patient Scheduled  Creola Doheny [facility], [facility] Guide  Direct Dial: [phone]  Fax [phone]

## 2023-09-17 NOTE — Assessment & Plan Note (Addendum)
 Discontinued Keflex  and initiated doxycycline 100 mg twice daily for 10 days for MRSA coverage. Repeat CBC and CMP were ordered to monitor for systemic infection. The patient is scheduled to follow up with Orthopedics on Friday. Advised follow-up in 2-3 days if symptoms worsen or fail to improve. If WBC continues to rise or symptoms escalate,may require IV antibiotics-asked with patient and wife and they understand if this is the case he should be taken to the emergency department.  Results pending.  Advised  elevating leg on pillows, rest, and weightbearing as tolerated. Education provided  on signs of worsening infection, including increased redness, swelling, drainage, fever, or systemic symptoms, seek emergency care if these occur.

## 2023-09-18 ENCOUNTER — Other Ambulatory Visit: Payer: Self-pay

## 2023-09-18 ENCOUNTER — Ambulatory Visit: Payer: Self-pay | Admitting: Student

## 2023-09-18 LAB — CBC WITH DIFFERENTIAL/PLATELET
Basophils Absolute: 0.2 10*3/uL — ABNORMAL HIGH (ref 0.0–0.1)
Basophils Relative: 1.9 % (ref 0.0–3.0)
Eosinophils Absolute: 0.1 10*3/uL (ref 0.0–0.7)
Eosinophils Relative: 1 % (ref 0.0–5.0)
HCT: 44.5 % (ref 39.0–52.0)
Hemoglobin: 14.6 g/dL (ref 13.0–17.0)
Lymphocytes Relative: 29.6 % (ref 12.0–46.0)
Lymphs Abs: 2.9 10*3/uL (ref 0.7–4.0)
MCHC: 32.8 g/dL (ref 30.0–36.0)
MCV: 94.2 fl (ref 78.0–100.0)
Monocytes Absolute: 0.9 10*3/uL (ref 0.1–1.0)
Monocytes Relative: 9.3 % (ref 3.0–12.0)
Neutro Abs: 5.8 10*3/uL (ref 1.4–7.7)
Neutrophils Relative %: 58.2 % (ref 43.0–77.0)
Platelets: 119 10*3/uL — ABNORMAL LOW (ref 150.0–400.0)
RBC: 4.72 Mil/uL (ref 4.22–5.81)
RDW: 14.1 % (ref 11.5–15.5)
WBC: 10 10*3/uL (ref 4.0–10.5)

## 2023-09-18 LAB — COMPREHENSIVE METABOLIC PANEL WITH GFR
ALT: 12 U/L (ref 0–53)
AST: 25 U/L (ref 0–37)
Albumin: 4.3 g/dL (ref 3.5–5.2)
Alkaline Phosphatase: 85 U/L (ref 39–117)
BUN: 20 mg/dL (ref 6–23)
CO2: 27 meq/L (ref 19–32)
Calcium: 9.6 mg/dL (ref 8.4–10.5)
Chloride: 105 meq/L (ref 96–112)
Creatinine, Ser: 1.39 mg/dL (ref 0.40–1.50)
GFR: 47.93 mL/min — ABNORMAL LOW (ref >60.00)
Glucose, Bld: 104 mg/dL — ABNORMAL HIGH (ref 70–99)
Potassium: 4.5 meq/L (ref 3.5–5.1)
Sodium: 138 meq/L (ref 135–145)
Total Bilirubin: 0.7 mg/dL (ref 0.2–1.2)
Total Protein: 6.5 g/dL (ref 6.0–8.3)

## 2023-09-19 ENCOUNTER — Other Ambulatory Visit (HOSPITAL_BASED_OUTPATIENT_CLINIC_OR_DEPARTMENT_OTHER): Payer: Self-pay

## 2023-09-19 ENCOUNTER — Other Ambulatory Visit: Payer: Self-pay | Admitting: Neurology

## 2023-09-19 DIAGNOSIS — G20B1 Parkinson's disease with dyskinesia, without mention of fluctuations: Secondary | ICD-10-CM

## 2023-09-19 MED ORDER — CARBIDOPA-LEVODOPA 25-100 MG PO TABS
ORAL_TABLET | ORAL | 0 refills | Status: DC
  Filled 2023-09-26: qty 630, 90d supply, fill #0

## 2023-09-20 ENCOUNTER — Encounter: Payer: Self-pay | Admitting: Family

## 2023-09-20 ENCOUNTER — Ambulatory Visit: Admitting: Family

## 2023-09-20 DIAGNOSIS — L03116 Cellulitis of left lower limb: Secondary | ICD-10-CM | POA: Diagnosis not present

## 2023-09-20 DIAGNOSIS — L97521 Non-pressure chronic ulcer of other part of left foot limited to breakdown of skin: Secondary | ICD-10-CM | POA: Diagnosis not present

## 2023-09-20 NOTE — Progress Notes (Signed)
 Office Visit Note   Patient: Vincent Walker           Date of Birth: 11-03-1945           MRN: [id number] Visit Date: 09/20/2023              Requested by: Neda Balk, MD [address],  [place] [address] PCP: Neda Balk, MD  Chief Complaint  Patient presents with   Left Foot - Pain      HPI: The patient is an 78 year old gentleman who is seen today for evaluation of cellulitis with blister to the left lower extremity  This began on Sunday earlier this week did present to the emergency department that day was started on Keflex  for cellulitis there was no ulcer or blister at that time he went on to then develop blister adjacent to a callus to the plantar aspect of the left foot was seen by primary care on Tuesday of this week they changed him from Keflex  to doxycycline  The blister burst this morning he has had clear bloody drainage from the blister the plantar aspect left foot they have noticed some decrease in his pain and redness to the lower extremity in the last 24 hours  Assessment & Plan: Visit Diagnoses: No diagnosis found.  Plan: He will continue on the doxycycline begin antibacterial ointment or Aquaphor dressing changes to the foot offload the forefoot Ace for compression from the base of the toes to the knee  Follow-Up Instructions: No follow-ups on file.   Ortho Exam  Patient is alert, oriented, no adenopathy, well-dressed, normal affect, normal respiratory effort. On examination left lower extremity there is pitting edema present with mild erythema there is hemosiderin staining changes consistent with venous insufficiency.  Beneath the left foot there is callused ulceration beneath the fourth metatarsal head with surrounding macerated tissue which has unroofed this is debrided with a 10 blade knife back to viable tissue this does not probe to bone there is no exposed bone or tendon ulcer measuring 7 cm in diameter    Imaging: No  results found. No images are attached to the encounter.  Labs: Lab Results  Component Value Date   HGBA1C 6.0 07/09/2023   HGBA1C 6.0 12/27/2020   HGBA1C 6.0 04/07/2019     Lab Results  Component Value Date   ALBUMIN 4.3 09/17/2023   ALBUMIN 4.3 09/15/2023   ALBUMIN 4.4 07/09/2023    No results found for: MG No results found for: VD25OH  No results found for: PREALBUMIN    Latest Ref Rng & Units 09/17/2023    2:17 PM 09/15/2023    3:04 PM 07/09/2023   10:18 AM  CBC EXTENDED  WBC 4.0 - 10.5 K/uL 10.0  10.7  6.9   RBC 4.22 - 5.81 Mil/uL 4.72  4.53  4.65   Hemoglobin 13.0 - 17.0 g/dL 32.9  51.8  84.1   HCT 39.0 - 52.0 % 44.5  42.3  43.8   Platelets 150.0 - 400.0 K/uL 119.0  115  145.0   NEUT# 1.4 - 7.7 K/uL 5.8  6.7  4.0   Lymph# 0.7 - 4.0 K/uL 2.9  2.7  2.3      There is no height or weight on file to calculate BMI.  Orders:  No orders of the defined types were placed in this encounter.  No orders of the defined types were placed in this encounter.    Procedures: No  procedures performed  Clinical Data: No additional findings.  ROS:  All other systems negative, except as noted in the HPI. Review of Systems  Objective: Vital Signs: There were no vitals taken for this visit.  Specialty Comments:  No specialty comments available.  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   Posterior vitreous detachment of both eyes 08/21/2022   Pseudophakia, both eyes 08/21/2022   Primary open-angle glaucoma, left eye, severe stage 05/05/2021   Eustachian tube dysfunction, left 02/21/2021   Early dry stage nonexudative age-related macular degeneration of both eyes 03/09/2019   Adenomatous polyp of ascending colon  10/09/2018   Epistaxis 07/07/2018   Allergies 07/07/2018   Obesity 06/15/2018   Increased thyroid  stimulating hormone (TSH) level 06/07/2017   MVP (mitral valve prolapse) 05/14/2016   Blepharitis of upper and lower eyelids of both eyes 01/10/2016   Primary open-angle glaucoma, right eye, moderate stage 01/10/2016   Parkinson's disease (HCC) 12/23/2015   Hyperglycemia 12/23/2015   Tremor 02/14/2015   Dyslipidemia 02/08/2015   History of renal cell carcinoma 02/08/2015   Thrombocytopenia 02/08/2015   History of chicken pox    Essential hypertension 12/25/2011   Stage 3 chronic kidney disease 12/25/2011   Past Medical History:  Diagnosis Date   Adenomatous polyp of ascending colon 10/09/2018   Allergies 07/07/2018   Arthritis    Astigmatism of both eyes with presbyopia 08/21/2022   Blepharitis of upper and lower eyelids of both eyes 01/10/2016   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 Required nasal packing a couple of days ago.  Had a similar episode of epistaxis a little over a year ago.  At that visit, I could not see the exact bleeding spot. EXAMINATION after packing removal reveals several excoriated areas anteriorly but I was unable to see t   Essential hypertension 12/25/2011   Eustachian tube dysfunction, left 02/21/2021   Flank pain 07/19/2022   History of blood transfusion 2010   After Kidney surgery   History of chicken pox    History of renal cell carcinoma 02/08/2015   diagnosed and removed in 2010 Right Monitored by Dr Kristeen Peto of urology at [facility] Dr Glenn Lange, nephrology at [facility]   Hyperglycemia 12/23/2015   Increased thyroid  stimulating hormone (TSH) level  06/07/2017   Ingrown left big toenail 02/14/2015   Left foot pain 02/14/2015   MVP (mitral valve prolapse) 05/14/2016   Parkinson's disease (HCC) 12/23/2015   Posterior vitreous detachment of both eyes 08/21/2022   Primary open-angle glaucoma, left eye, severe stage 05/05/2021   Primary open-angle glaucoma, right eye, moderate stage 01/10/2016   Pseudophakia, both eyes 08/21/2022   Right hip pain 12/12/2016   Stage 3 chronic kidney disease 12/25/2011   Thrombocytopenia 02/08/2015   Tremor 02/14/2015    Family History  Problem Relation Age of Onset   Heart attack Mother    Stroke Mother        swelling in brain stem  Glaucoma Mother    Alcohol abuse Father    Hyperlipidemia Father    Hypertension Father    Diabetes Father    Heart disease Father    Healthy Daughter    Healthy Daughter    Healthy Son    Cancer Maternal Aunt        lung cancer   Cancer Paternal Uncle        bone cancer   Colon cancer Neg Hx    Esophageal cancer Neg Hx    Stomach cancer Neg Hx    Inflammatory bowel disease Neg Hx    Liver disease Neg Hx    Pancreatic cancer Neg Hx    Rectal cancer Neg Hx     Past Surgical History:  Procedure Laterality Date   APPENDECTOMY  1995   CATARACT EXTRACTION, BILATERAL     COLONOSCOPY     COLONOSCOPY WITH PROPOFOL  N/A 12/17/2018   Procedure: COLONOSCOPY WITH PROPOFOL ;  Surgeon: Brice Campi Albino Alu., MD;  Location: [facility] ENDOSCOPY;  Service: Gastroenterology;  Laterality: N/A;   ENDOSCOPIC MUCOSAL RESECTION N/A 12/17/2018   Procedure: ENDOSCOPIC MUCOSAL RESECTION;  Surgeon: Brice Campi Albino Alu., MD;  Location: [facility] ENDOSCOPY;  Service: Gastroenterology;  Laterality: N/A;   HEMOSTASIS CLIP PLACEMENT  12/17/2018   Procedure: HEMOSTASIS CLIP PLACEMENT;  Surgeon: Normie Becton., MD;  Location: [facility] ENDOSCOPY;  Service: Gastroenterology;;   left knee scope  2003   POLYPECTOMY  12/17/2018   Procedure: POLYPECTOMY;  Surgeon: Mansouraty, Albino Alu., MD;   Location: [facility] ENDOSCOPY;  Service: Gastroenterology;;   right knee scope  1999   SUBMUCOSAL LIFTING INJECTION  12/17/2018   Procedure: SUBMUCOSAL LIFTING INJECTION;  Surgeon: Normie Becton., MD;  Location: [facility] END[facility]COPY;  Service: Gastroenterology;;   TOE SURGERY Left    metal 2nd toe- and top of foot- straighten bone   TONSILLECTOMY     TOTAL KNEE ARTHROPLASTY Left 07/06/2014   TOTAL NEPHRECTOMY Right    Social History   Occupational History   Occupation: Retired    Comment: Production designer, theatre/television/film in [employer]  Tobacco Use   Smoking status: Former    Types: Pipe    Quit date: 11/18/1995    Years since quitting: 27.8   Smokeless tobacco: Never   Tobacco comments:    stopped 20 years ago.  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

## 2023-09-23 ENCOUNTER — Other Ambulatory Visit: Payer: Self-pay | Admitting: Family Medicine

## 2023-09-23 ENCOUNTER — Other Ambulatory Visit: Payer: Self-pay | Admitting: Licensed Clinical Social Worker

## 2023-09-23 ENCOUNTER — Other Ambulatory Visit (HOSPITAL_BASED_OUTPATIENT_CLINIC_OR_DEPARTMENT_OTHER): Payer: Self-pay

## 2023-09-23 DIAGNOSIS — R2681 Unsteadiness on feet: Secondary | ICD-10-CM

## 2023-09-23 DIAGNOSIS — L03116 Cellulitis of left lower limb: Secondary | ICD-10-CM

## 2023-09-23 DIAGNOSIS — R531 Weakness: Secondary | ICD-10-CM

## 2023-09-24 ENCOUNTER — Encounter: Payer: Self-pay | Admitting: Family Medicine

## 2023-09-24 ENCOUNTER — Ambulatory Visit (INDEPENDENT_AMBULATORY_CARE_PROVIDER_SITE_OTHER): Admitting: Orthopedic Surgery

## 2023-09-24 ENCOUNTER — Telehealth (INDEPENDENT_AMBULATORY_CARE_PROVIDER_SITE_OTHER): Admitting: Family Medicine

## 2023-09-24 ENCOUNTER — Other Ambulatory Visit

## 2023-09-24 DIAGNOSIS — L03116 Cellulitis of left lower limb: Secondary | ICD-10-CM | POA: Diagnosis not present

## 2023-09-24 DIAGNOSIS — L97521 Non-pressure chronic ulcer of other part of left foot limited to breakdown of skin: Secondary | ICD-10-CM | POA: Diagnosis not present

## 2023-09-24 DIAGNOSIS — R350 Frequency of micturition: Secondary | ICD-10-CM

## 2023-09-24 NOTE — Progress Notes (Signed)
 MyChart Video Visit    Virtual Visit via Video Note   This patient is at least at moderate risk for complications without adequate follow up. This format is felt to be most appropriate for this patient at this time. Physical exam was limited by quality of the video and audio technology used for the visit. Porsha, CMA was able to get the patient set up on a video visit.  Patient location: home Patient and provider in visit Provider location: Office  I discussed the limitations of evaluation and management by telemedicine and the availability of in person appointments. The patient expressed understanding and agreed to proceed.  Visit Date: 09/24/2023  Today's healthcare provider: Harlene Horton, MD     Subjective:    Patient ID: Vincent Walker, male    DOB: 01-19-1946, 78 y.o.   MRN: [id number]  Chief Complaint  Patient presents with   Acute Visit    Patient presents today for referral for home health.    HPI Discussed the use of AI scribe software for clinical note transcription with the patient, who gave verbal consent to proceed.  History of Present Illness Vincent Walker is an 78 year old male with Parkinson's disease who presents with a worsening foot wound and swelling. He is accompanied by his caregiver.  The foot wound began as a blister around a callus, which rapidly expanded to the size of an egg within two days. Initial treatment with Keflex  was ineffective, leading to a switch to doxycycline , which provided some pain relief but did not reduce swelling. The blister ruptured during a shower, releasing blood and exudate. Despite ten days of doxycycline , taken twice daily, the foot remains swollen and red, with a new small blood blister forming. He has two days of medication remaining. He uses Aquaphor and clean bandages daily. No pain is reported, but the persistent swelling and redness are concerning.  He experiences increased Parkinson's symptoms, including tingling and a  'needles' sensation in his legs and feet, which are relieved by more frequent use of Inbrija .  He reports increased urinary urgency, waking every 30 to 45 minutes at night to urinate. He has a history of an enlarged prostate and kidney cancer in 2010, for which he sees a urologist annually. No signs of a urinary tract infection, such as discoloration or pain during urination, are present.  He has been using a wheelchair due to the inability to walk on the affected foot, which was previously very painful. He is unable to use a walker due to the pain and swelling in the foot.    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 Required nasal packing a couple of days ago.  Had a similar episode of epistaxis a little over a year ago.  At that visit, I could not see the exact bleeding spot. EXAMINATION after packing removal reveals several excoriated areas anteriorly but I was unable to see t   Essential hypertension  12/25/2011   Eustachian tube dysfunction, left 02/21/2021   Flank pain 07/19/2022   History of blood transfusion 2010   After Kidney surgery   History of chicken pox    History of renal cell carcinoma 02/08/2015   diagnosed and removed in 2010 Right Monitored by Dr Tanda Moats of urology at [facility] Dr Claudine Alley, nephrology at [facility]   Hyperglycemia 12/23/2015   Increased thyroid  stimulating hormone (TSH) level 06/07/2017   Ingrown left big  toenail 02/14/2015   Left foot pain 02/14/2015   MVP (mitral valve prolapse) 05/14/2016   Parkinson's disease (HCC) 12/23/2015   Posterior vitreous detachment of both eyes 08/21/2022   Primary open-angle glaucoma, left eye, severe stage 05/05/2021   Primary open-angle glaucoma, right eye, moderate stage 01/10/2016   Pseudophakia, both eyes 08/21/2022   Right hip pain 12/12/2016   Stage 3 chronic kidney disease 12/25/2011   Thrombocytopenia 02/08/2015   Tremor 02/14/2015    Past Surgical History:  Procedure Laterality Date   APPENDECTOMY  1995   CATARACT EXTRACTION, BILATERAL     COLONOSCOPY     COLONOSCOPY WITH PROPOFOL  N/A 12/17/2018   Procedure: COLONOSCOPY WITH PROPOFOL ;  Surgeon: Wilhelmenia Aloha Raddle., MD;  Location: [facility] ENDOSCOPY;  Service: Gastroenterology;  Laterality: N/A;   ENDOSCOPIC MUCOSAL RESECTION N/A 12/17/2018   Procedure: ENDOSCOPIC MUCOSAL RESECTION;  Surgeon: Wilhelmenia Aloha Raddle., MD;  Location: [facility] ENDOSCOPY;  Service: Gastroenterology;  Laterality: N/A;   HEMOSTASIS CLIP PLACEMENT  12/17/2018   Procedure: HEMOSTASIS CLIP PLACEMENT;  Surgeon: Wilhelmenia Aloha Raddle., MD;  Location: [facility] ENDOSCOPY;  Service: Gastroenterology;;   left knee scope  2003   POLYPECTOMY  12/17/2018   Procedure: POLYPECTOMY;  Surgeon: Wilhelmenia Aloha Raddle., MD;  Location: [facility] ENDOSCOPY;  Service: Gastroenterology;;   right knee scope  1999   SUBMUCOSAL LIFTING INJECTION  12/17/2018   Procedure: SUBMUCOSAL LIFTING INJECTION;  Surgeon: Wilhelmenia Aloha Raddle., MD;  Location: [facility] ENDOSCOPY;  Service: Gastroenterology;;   TOE SURGERY Left    metal 2nd toe- and top of foot- straighten bone   TONSILLECTOMY     TOTAL KNEE ARTHROPLASTY Left 07/06/2014   TOTAL NEPHRECTOMY Right     Family History  Problem Relation Age of Onset   Heart attack Mother    Stroke Mother        swelling in brain stem   Glaucoma Mother    Alcohol abuse Father    Hyperlipidemia Father    Hypertension Father     Diabetes Father    Heart disease Father    Healthy Daughter    Healthy Daughter    Healthy Son    Cancer Maternal Aunt        lung cancer   Cancer Paternal Uncle        bone cancer   Colon cancer Neg Hx    Esophageal cancer Neg Hx    Stomach cancer Neg Hx    Inflammatory bowel disease Neg Hx    Liver disease Neg Hx    Pancreatic cancer Neg Hx    Rectal cancer Neg Hx     Social History   Socioeconomic History   Marital status: Married    Spouse name: Not on file   Number of children: 3   Years of education: 16   Highest education level: Bachelor's degree (e.g., BA, AB, BS)  Occupational History   Occupation: Retired    CommentHydrologist in [employer]  Tobacco Use   Smoking status: Former    Types: Pipe  Quit date: 11/18/1995    Years since quitting: 27.8   Smokeless tobacco: Never   Tobacco comments:    stopped 20 years ago.  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   Other Topics Concern   Not on file  Social History Narrative   Right Handed   Lives in a one story home   Drinks one cup of coffee a day   Social Drivers of Health   Financial Resource Strain: Low Risk  (07/06/2023)   Overall Financial Resource Strain (CARDIA)    Difficulty of Paying Living Expenses: Not hard at all  Food Insecurity: No Food Insecurity (07/06/2023)   Hunger Vital Sign    Worried About Running Out of Food in the Last Year: Never true    Ran Out of Food in the Last Year: Never true  Transportation Needs: No Transportation Needs (07/06/2023)   PRAPARE - Administrator, Civil Service (Medical): No    Lack of Transportation (Non-Medical): No  Physical Activity: Unknown (07/06/2023)   Exercise Vital Sign    Days of Exercise per Week: 0 days    Minutes of  Exercise per Session: Not on file  Stress: No Stress Concern Present (07/06/2023)   [employer] of Occupational Health - Occupational Stress Questionnaire    Feeling of Stress : Only a little  Social Connections: Unknown (07/06/2023)   Social Connection and Isolation Panel    Frequency of Communication with Friends and Family: Once a week    Frequency of Social Gatherings with Friends and Family: Patient declined    Attends Religious Services: Never    Database administrator or Organizations: No    Attends Engineer, structural: Not on file    Marital Status: Married  Catering manager Violence: Not At Risk (11/27/2021)   Humiliation, Afraid, Rape, and Kick questionnaire    Fear of Current or Ex-Partner: No    Emotionally Abused: No    Physically Abused: No    Sexually Abused: No    Outpatient Medications Prior to Visit  Medication Sig Dispense Refill   acetaminophen  (TYLENOL ) 500 MG tablet Take 1,000 mg by mouth every 6 (six) hours as needed (pain.).      atenolol  (TENORMIN ) 25 MG tablet Take 1 tablet (25 mg total) by mouth every evening. (Patient taking differently: Take 12.5 mg by mouth daily.) 90 tablet 3   atorvastatin  (LIPITOR) 20 MG tablet Take 1 tablet (20 mg total) by mouth daily. 90 tablet 3   Brinzolamide -Brimonidine  (SIMBRINZA ) 1-0.2 % SUSP Place 1 drop into both eyes 3 (three) times daily. 8 mL 11   carbidopa -levodopa  (SINEMET  CR) 50-200 MG tablet Take 1 tablet by mouth at bedtime. 90 tablet 0   carbidopa -levodopa  (SINEMET  IR) 25-100 MG tablet Take 2 tablets by mouth at 8am, 2 tablets at 11am, 2 tablets at 2pm, and 1 tablet at 6pm. 630 tablet 0   clonazePAM  (KLONOPIN ) 0.5 MG tablet Take 1 tablet (0.5 mg total) by mouth at bedtime. 30 tablet 5   divalproex  (DEPAKOTE  ER) 250 MG 24 hr tablet Take 1 tablet (250 mg total) by mouth daily. 90 tablet 0   donepezil  (ARICEPT ) 10 MG tablet Take 1 tablet (10 mg total) by mouth at bedtime. 30  tablet 2   doxycycline  (VIBRAMYCIN )  100 MG capsule Take 1 capsule (100 mg total) by mouth 2 (two) times daily for 10 days. 20 capsule 0   ketoconazole  (NIZORAL ) 2 % shampoo Use every other day, applying directly to damp scalp, lather, let sit 5-10 minutes and rinse. 120 mL 3   Levodopa  (INBRIJA ) 42 MG CAPS TWO capsules is ONE dosage (never inhale just one capsule). You can inhale the capsules as needed up to 5 times per day, separated by 2 hour intervals. 300 capsule 5   mirtazapine  (REMERON ) 15 MG tablet Take 1 tablet (15 mg total) by mouth at bedtime. 90 tablet 0   Multiple Vitamins-Minerals (PRESERVISION AREDS 2 PO) Take 1 tablet by mouth daily.     Polyethyl Glycol-Propyl Glycol (LUBRICANT EYE DROPS) 0.4-0.3 % SOLN Place 1 drop into both eyes 3 (three) times daily as needed (dry/irritated eyes.).     No facility-administered medications prior to visit.    Allergies  Allergen Reactions   Nsaids    Sulfa Antibiotics Other (See Comments)    Had a reaction as a child   Tolmetin Other (See Comments)    Review of Systems  Constitutional:  Negative for fever and malaise/fatigue.  HENT:  Negative for congestion.   Eyes:  Negative for blurred vision.  Respiratory:  Negative for shortness of breath.   Cardiovascular:  Positive for leg swelling. Negative for chest pain and palpitations.  Gastrointestinal:  Negative for abdominal pain, blood in stool and nausea.  Genitourinary:  Negative for dysuria and frequency.  Musculoskeletal:  Positive for joint pain. Negative for falls.  Skin:  Negative for rash.  Neurological:  Positive for tingling. Negative for dizziness, loss of consciousness and headaches.  Endo/Heme/Allergies:  Negative for environmental allergies.  Psychiatric/Behavioral:  Negative for depression. The patient is not nervous/anxious.       Objective:    Physical Exam Constitutional:      General: He is not in acute distress.    Appearance: Normal appearance. He is not ill-appearing or toxic-appearing.  HENT:      Head: Normocephalic and atraumatic.     Right Ear: External ear normal.     Left Ear: External ear normal.     Nose: Nose normal.   Eyes:     General:        Right eye: No discharge.        Left eye: No discharge.   Pulmonary:     Effort: Pulmonary effort is normal.   Skin:    Findings: No rash.   Neurological:     Mental Status: He is alert and oriented to person, place, and time.   Psychiatric:        Behavior: Behavior normal.   There were no vitals taken for this visit. Wt Readings from Last 3 Encounters:  07/08/23 207 lb 12.8 oz (94.3 kg)  02/25/23 207 lb 9.6 oz (94.2 kg)  08/16/22 207 lb 9.6 oz (94.2 kg)       Assessment & Plan:  Cellulitis of left foot  Increased urinary frequency -     Urinalysis, Routine w reflex microscopic; Future -     Urine Culture; Future     Assessment and Plan Assessment & Plan Infected foot blister Blister on left foot callus infected, treated with doxycycline . Swelling and redness persist, new blood blister formed. No systemic symptoms, urgent ortho follow-up needed. - Continue doxycycline  for 10 days. - Attend ortho care appointment. - Consider imaging or MRI  if ortho care unsatisfactory. - Consider wound clinic referral if necessary. - Daily rewrap foot with clean bandages.  Mobility issues due to foot pain Mobility compromised by foot pain and swelling, requiring wheelchair use. Physical therapy needed to maintain strength and mobility. - Refer to physical therapy for in-home sessions. - Arrange nurse for wound monitoring and home health care.  Parkinson's disease exacerbation Increased Parkinson's symptoms likely due to stress and infection. Symptoms improved with Inbrija , indicating relation to Parkinson's. - Continue Parkinson's medications, including Inbrija  as needed. - Monitor for further symptom exacerbation.  Nocturia Increased nocturia without pain or discoloration, likely due to enlarged prostate.  Urinalysis needed to rule out infection. - Collect urine sample for urinalysis and culture. - Consider urology referral if urinalysis negative. - Discuss treatment options with urology if necessary.     I discussed the assessment and treatment plan with the patient. The patient was provided an opportunity to ask questions and all were answered. The patient agreed with the plan and demonstrated an understanding of the instructions.   The patient was advised to call back or seek an in-person evaluation if the symptoms worsen or if the condition fails to improve as anticipated.  Harlene Horton, MD [facility] at [facility] [phone] (phone) [phone] (fax)  [facility]

## 2023-09-24 NOTE — Patient Outreach (Signed)
 Complex Care Management   Visit Note  09/24/2023  Name:  Vincent Walker MRN: [id number] DOB: [birth year]  Situation: Referral received for Complex Care Management related to parkinson's I obtained verbal consent from Caregiver.  Visit completed with spouse  on the phone. 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 Required nasal packing a couple of days ago.  Had a similar episode of epistaxis a little over a year ago.  At that visit, I could not see the exact bleeding spot. EXAMINATION after packing removal reveals several excoriated areas anteriorly but I was unable to see t   Essential hypertension 12/25/2011   Eustachian tube dysfunction, left 02/21/2021   Flank pain 07/19/2022   History of blood transfusion 2010   After Kidney surgery   History of chicken pox    History of renal cell carcinoma 02/08/2015   diagnosed and removed in 2010 Right Monitored by Dr Tanda Moats of urology at [facility] Dr Claudine Alley, nephrology at [facility]   Hyperglycemia 12/23/2015   Increased thyroid  stimulating hormone (TSH) level 06/07/2017   Ingrown left big toenail 02/14/2015   Left foot pain 02/14/2015   MVP (mitral valve prolapse) 05/14/2016   Parkinson's disease (HCC) 12/23/2015   Posterior vitreous detachment of both eyes 08/21/2022    Primary open-angle glaucoma, left eye, severe stage 05/05/2021   Primary open-angle glaucoma, right eye, moderate stage 01/10/2016   Pseudophakia, both eyes 08/21/2022   Right hip pain 12/12/2016   Stage 3 chronic kidney disease 12/25/2011   Thrombocytopenia 02/08/2015   Tremor 02/14/2015    Assessment: Patient Reported Symptoms:  Cognitive Cognitive Status: Able to follow simple commands, Alert and oriented to person, place, and time Cognitive/Intellectual Conditions Management [RPT]: None reported or documented in medical history or problem list   Healing Pattern: Average  Neurological Neurological Review of Symptoms: Weakness Neurological Conditions: Parkinson's disease Neurological Management Strategies: Adequate rest, Medication therapy  HEENT HEENT Symptoms Reported: No symptoms reported      Cardiovascular Cardiovascular Symptoms Reported: No symptoms reported Does patient have uncontrolled Hypertension?: No    Respiratory Respiratory Symptoms Reported: No symptoms reported    Endocrine Patient reports the following symptoms related to hypoglycemia or hyperglycemia : No symptoms reported    Gastrointestinal Gastrointestinal Symptoms Reported: No symptoms reported      Genitourinary Genitourinary Symptoms Reported: No symptoms reported    Integumentary Integumentary Symptoms Reported: No symptoms reported    Musculoskeletal Musculoskelatal Symptoms Reviewed: Difficulty walking, Weakness        Psychosocial       Quality of Family Relationships: supportive, involved, helpful Do you feel physically threatened by others?: No      07/19/2022    9:06 AM  Depression screen PHQ 2/9  Decreased Interest 0  Down, Depressed, Hopeless 0  PHQ - 2 Score 0  There were no vitals filed for this visit.  Medications Reviewed Today   Medications were not reviewed in this encounter     Recommendation:   Home Health requests: Physical therapy Wound care Continue  Current Plan of Care  Follow Up Plan:   Telephone follow up appointment date/time:  09/30/2023  Alfonso Rummer MSW, LCSW Licensed Clinical Social Worker  [facility], Population Health Direct Dial: [phone]  Fax: [phone]

## 2023-09-24 NOTE — Patient Instructions (Signed)
 Visit Information  Thank you for taking time to visit with me today. Please don't hesitate to contact me if I can be of assistance to you before our next scheduled appointment.  Your next care management appointment is by telephone on 09/30/2023 at 1pm  Referral to home health.  Please call the care guide team at [phone] if you need to cancel, schedule, or reschedule an appointment.   Please call 911 if you are experiencing a Mental Health or Behavioral Health Crisis or need someone to talk to.  Alfonso Rummer MSW, LCSW Licensed Clinical Social Worker  [facility], Population Health Direct Dial: [phone]  Fax: [phone]

## 2023-09-25 ENCOUNTER — Other Ambulatory Visit (HOSPITAL_BASED_OUTPATIENT_CLINIC_OR_DEPARTMENT_OTHER): Payer: Self-pay

## 2023-09-25 ENCOUNTER — Other Ambulatory Visit: Payer: Self-pay | Admitting: Family Medicine

## 2023-09-25 ENCOUNTER — Ambulatory Visit: Payer: Self-pay | Admitting: Family Medicine

## 2023-09-25 ENCOUNTER — Encounter: Payer: Self-pay | Admitting: Family Medicine

## 2023-09-25 ENCOUNTER — Encounter: Payer: Self-pay | Admitting: Orthopedic Surgery

## 2023-09-25 ENCOUNTER — Other Ambulatory Visit (INDEPENDENT_AMBULATORY_CARE_PROVIDER_SITE_OTHER): Admitting: Pharmacist

## 2023-09-25 ENCOUNTER — Encounter: Payer: Self-pay | Admitting: Pharmacist

## 2023-09-25 DIAGNOSIS — Z85528 Personal history of other malignant neoplasm of kidney: Secondary | ICD-10-CM | POA: Diagnosis not present

## 2023-09-25 DIAGNOSIS — L03116 Cellulitis of left lower limb: Secondary | ICD-10-CM | POA: Diagnosis not present

## 2023-09-25 DIAGNOSIS — I129 Hypertensive chronic kidney disease with stage 1 through stage 4 chronic kidney disease, or unspecified chronic kidney disease: Secondary | ICD-10-CM | POA: Diagnosis not present

## 2023-09-25 DIAGNOSIS — H9193 Unspecified hearing loss, bilateral: Secondary | ICD-10-CM | POA: Diagnosis not present

## 2023-09-25 DIAGNOSIS — G20A1 Parkinson's disease without dyskinesia, without mention of fluctuations: Secondary | ICD-10-CM | POA: Diagnosis not present

## 2023-09-25 DIAGNOSIS — H353131 Nonexudative age-related macular degeneration, bilateral, early dry stage: Secondary | ICD-10-CM | POA: Diagnosis not present

## 2023-09-25 DIAGNOSIS — N401 Enlarged prostate with lower urinary tract symptoms: Secondary | ICD-10-CM | POA: Diagnosis not present

## 2023-09-25 DIAGNOSIS — F0280 Dementia in other diseases classified elsewhere without behavioral disturbance: Secondary | ICD-10-CM | POA: Diagnosis not present

## 2023-09-25 DIAGNOSIS — L84 Corns and callosities: Secondary | ICD-10-CM | POA: Diagnosis not present

## 2023-09-25 DIAGNOSIS — I058 Other rheumatic mitral valve diseases: Secondary | ICD-10-CM | POA: Diagnosis not present

## 2023-09-25 DIAGNOSIS — R351 Nocturia: Secondary | ICD-10-CM | POA: Diagnosis not present

## 2023-09-25 DIAGNOSIS — R350 Frequency of micturition: Secondary | ICD-10-CM | POA: Diagnosis not present

## 2023-09-25 DIAGNOSIS — G2581 Restless legs syndrome: Secondary | ICD-10-CM

## 2023-09-25 DIAGNOSIS — Z556 Problems related to health literacy: Secondary | ICD-10-CM | POA: Diagnosis not present

## 2023-09-25 DIAGNOSIS — H52203 Unspecified astigmatism, bilateral: Secondary | ICD-10-CM | POA: Diagnosis not present

## 2023-09-25 DIAGNOSIS — Z87891 Personal history of nicotine dependence: Secondary | ICD-10-CM | POA: Diagnosis not present

## 2023-09-25 DIAGNOSIS — D696 Thrombocytopenia, unspecified: Secondary | ICD-10-CM | POA: Diagnosis not present

## 2023-09-25 DIAGNOSIS — N183 Chronic kidney disease, stage 3 unspecified: Secondary | ICD-10-CM | POA: Diagnosis not present

## 2023-09-25 DIAGNOSIS — S90822D Blister (nonthermal), left foot, subsequent encounter: Secondary | ICD-10-CM | POA: Diagnosis not present

## 2023-09-25 LAB — URINALYSIS, ROUTINE W REFLEX MICROSCOPIC
Bilirubin Urine: NEGATIVE
Hgb urine dipstick: NEGATIVE
Leukocytes,Ua: NEGATIVE
Nitrite: NEGATIVE
RBC / HPF: NONE SEEN (ref 0–?)
Specific Gravity, Urine: 1.025 (ref 1.000–1.030)
Total Protein, Urine: NEGATIVE
Urine Glucose: NEGATIVE
Urobilinogen, UA: 1 (ref 0.0–1.0)
pH: 6 (ref 5.0–8.0)

## 2023-09-25 LAB — URINE CULTURE
MICRO NUMBER:: 16618290
Result:: NO GROWTH
SPECIMEN QUALITY:: ADEQUATE

## 2023-09-25 MED ORDER — ATENOLOL 25 MG PO TABS
12.5000 mg | ORAL_TABLET | Freq: Every day | ORAL | 1 refills | Status: DC
  Filled 2023-09-25 – 2023-09-27 (×3): qty 45, 90d supply, fill #0
  Filled 2023-12-19: qty 45, 90d supply, fill #1

## 2023-09-25 NOTE — Progress Notes (Signed)
 09/25/2023 Name: Vincent Walker MRN: [id number] DOB: October 04, 1945  Chief Complaint  Patient presents with   Medication Management    Vincent Walker is a 78 y.o. year old male who presented for a telephone visit.   They were referred to the pharmacist by neurology office, Dr Tat for assistance in managing complex medication management.    Subjective:  Care Team: Primary Care Provider: Domenica Harlene LABOR, MD ; Next Scheduled Visit: 11/25/2023 Neurologist: Dr Evonnie; Next Scheduled Visit: 11/13/2023 Orthopedist: Dr Harden; Next Scheduled Visit: 10/08/2023  Medication Access/Adherence  Current Pharmacy:  [facility] [place] - [facility] [address] [place] [address] Phone: [phone] Fax: [phone]  [facility] - [place], [place] - [phone] [place] [address] Phone: [phone] Fax: [phone]  [id number] - [place], [place] - [address] [place] [address] Phone: [phone] Fax: [phone]   Patient reports affordability concerns with their medications: No  -  Patient reports access/transportation concerns to their pharmacy: No  Patient reports adherence concerns with their medications:  No      Medication Management:  Current adherence strategy: Wife assists with medication administration. She is using weekly pill container with 4 times slots. (She wishes there were 5 slots due to carbidope/Levodopa  IR dosing.   Patient reports Good adherence to medications  Patient reports the following barriers to adherence: complicated dosing regimen  Patient's wife mentinos that he is not sleeping well. He is taking mirtazapine  15mg  at bedtime and also clonazepam  0.5mg  at bedtime. Per his wife, he is at the max recommended dose of clonazepam  per Dr Tat.   Current Rx for atenolol  has 25mg  daily for directions but patient is only  taking 0.5 tablet due to past low blood pressure readings.    Objective:  Lab Results  Component Value Date   HGBA1C 6.0 07/09/2023    Lab Results  Component Value Date   CREATININE 1.39 09/17/2023   BUN 20 09/17/2023   NA 138 09/17/2023   K 4.5 09/17/2023   CL 105 09/17/2023   CO2 27 09/17/2023    Lab Results  Component Value Date   CHOL 94 07/09/2023   HDL 48.40 07/09/2023   LDLCALC 25 07/09/2023   TRIG 100.0 07/09/2023   CHOLHDL 2 07/09/2023    Medications Reviewed Today     Reviewed by Carla Milling, RPH-CPP (Pharmacist) on 09/25/23 at 1339  Med List Status: <None>   Medication Order Taking? Sig Documenting Provider Last Dose Status Informant  acetaminophen  (TYLENOL ) 500 MG tablet [id number] Yes Take 1,000 mg by mouth every 6 (six) hours as needed (pain.).  [provider]  Active Spouse/Significant Other  atenolol  (TENORMIN ) 25 MG tablet [id number] Yes Take 1 tablet (25 mg total) by mouth every evening.  Patient taking differently: Take 12.5 mg by mouth daily.   Saguier, [place], PA-C  Active   atorvastatin  (LIPITOR) 20 MG tablet [id number] Yes Take 1 tablet (20 mg total) by mouth daily. Domenica Harlene LABOR, MD  Active   Brinzolamide -Brimonidine  (SIMBRINZA ) 1-0.2 % SUSP [id number] Yes Place 1 drop into both eyes 3 (three) times daily.   Active   carbidopa -levodopa  (SINEMET  CR) 50-200 MG tablet [id number] Yes Take 1 tablet by mouth at bedtime. Evonnie Asberry GORMAN, DO  Active   carbidopa -levodopa  (SINEMET  IR) 25-100 MG tablet [id number] Yes Take 2 tablets by  mouth at 8am, 2 tablets at 11am, 2 tablets at 2pm, and 1 tablet at 6pm. Tat, Asberry RAMAN, DO  Active   clonazePAM  (KLONOPIN ) 0.5 MG tablet [id number] Yes Take 1 tablet (0.5 mg total) by mouth at bedtime. Evonnie Asberry RAMAN, DO  Active   divalproex  (DEPAKOTE  ER) 250 MG 24 hr tablet [id number] Yes Take 1 tablet (250 mg total) by mouth daily. Evonnie Asberry RAMAN, DO  Active   donepezil  (ARICEPT ) 10 MG tablet [id number] Yes Take 1 tablet  (10 mg total) by mouth at bedtime. Domenica Harlene LABOR, MD  Active   doxycycline  (VIBRAMYCIN ) 100 MG capsule [id number] Yes Take 1 capsule (100 mg total) by mouth 2 (two) times daily for 10 days. Yacopino, Jessica L, NP  Active   ketoconazole  (NIZORAL ) 2 % shampoo [id number] Yes Use every other day, applying directly to damp scalp, lather, let sit 5-10 minutes and rinse. Domenica Harlene LABOR, MD  Active   Levodopa  (INBRIJA ) 42 MG CAPS [id number] Yes TWO capsules is ONE dosage (never inhale just one capsule). You can inhale the capsules as needed up to 5 times per day, separated by 2 hour intervals.  Patient taking differently: Inhale 2 capsules as needed up to 5 times per day, separated by 2 hour intervals. TWO capsules is ONE dosage (never inhale just one capsule). You can   Tat, Asberry RAMAN, DO  Active   MAGNESIUM GLYCINATE PO [id number] Yes Take 200 mg by mouth at bedtime. [provider]  Active   mirtazapine  (REMERON ) 15 MG tablet [id number] Yes Take 1 tablet (15 mg total) by mouth at bedtime. Evonnie Asberry RAMAN, DO  Active   Multiple Vitamins-Minerals (PRESERVISION AREDS 2 PO) [id number] Yes Take 1 tablet by mouth daily. [provider]  Active   Polyethyl Glycol-Propyl Glycol (LUBRICANT EYE DROPS) 0.4-0.3 % SOLN [id number] Yes Place 1 drop into both eyes 3 (three) times daily as needed (dry/irritated eyes.). [provider]  Active Spouse/Significant Other              Assessment/Plan:   Medication Management: - Currently strategy sufficient to maintain appropriate adherence to prescribed medication regimen - Suggested use of weekly pill box to organize medications. Sent information on pill reminder systems with 5 or more slots for frequent dosing.  - updated medication list and updated Rx for atenolol  with most up to date diretions.  - Discussed collaboration with local pharmacies for adherence packaging. Reviewed local pharmacies with adherence packaging options. Patient's wife  elects to continue to use weekly pill containers.   Difficulty Sleeping:  - Recommended patient discuss with Dr Tat. May need adjustment in Sinemet  ER dose or  other possible treatments for sleep disturbances in Parkinson's patients - low dose doxepin (6mg  or less) or trazodone (would need to monitor for serotonin syndrome if mirtazapine  is continued)   Madelin Ray, PharmD Clinical Pharmacist [facility]

## 2023-09-26 ENCOUNTER — Other Ambulatory Visit: Payer: Self-pay

## 2023-09-26 ENCOUNTER — Other Ambulatory Visit (HOSPITAL_BASED_OUTPATIENT_CLINIC_OR_DEPARTMENT_OTHER): Payer: Self-pay

## 2023-09-27 ENCOUNTER — Ambulatory Visit: Admitting: Family

## 2023-09-27 ENCOUNTER — Other Ambulatory Visit (HOSPITAL_BASED_OUTPATIENT_CLINIC_OR_DEPARTMENT_OTHER): Payer: Self-pay

## 2023-09-29 ENCOUNTER — Ambulatory Visit: Payer: Self-pay | Admitting: Family Medicine

## 2023-09-29 ENCOUNTER — Ambulatory Visit (HOSPITAL_BASED_OUTPATIENT_CLINIC_OR_DEPARTMENT_OTHER)
Admission: RE | Admit: 2023-09-29 | Discharge: 2023-09-29 | Disposition: A | Discharge status: Home / Self Care | Source: Ambulatory Visit | Attending: Family Medicine | Admitting: Family Medicine

## 2023-09-29 DIAGNOSIS — L03116 Cellulitis of left lower limb: Secondary | ICD-10-CM | POA: Diagnosis not present

## 2023-09-29 MED ORDER — GADOBUTROL 1 MMOL/ML IV SOLN
10.0000 mL | Freq: Once | INTRAVENOUS | Status: AC | PRN
  Administered 2023-09-29: 10 mL via INTRAVENOUS

## 2023-09-30 ENCOUNTER — Other Ambulatory Visit: Payer: Self-pay | Admitting: Licensed Clinical Social Worker

## 2023-09-30 ENCOUNTER — Encounter: Payer: Self-pay | Admitting: Orthopedic Surgery

## 2023-09-30 NOTE — Patient Outreach (Signed)
 Complex Care Management   Visit Note  09/30/2023  Name:  Vincent Walker MRN: [id number] DOB: 23-Feb-1946  Situation: Referral received for Complex Care Management related to caregiver support I obtained verbal consent from Caregiver.  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 Required nasal packing a couple of days ago.  Had a similar episode of epistaxis a little over a year ago.  At that visit, I could not see the exact bleeding spot. EXAMINATION after packing removal reveals several excoriated areas anteriorly but I was unable to see t   Essential hypertension 12/25/2011   Eustachian tube dysfunction, left 02/21/2021   Flank pain 07/19/2022   History of blood transfusion 2010   After Kidney surgery   History of chicken pox    History of renal cell carcinoma 02/08/2015   diagnosed and removed in 2010 Right Monitored by Dr Tanda Moats of urology at [facility] Dr Claudine Alley, nephrology at [facility]   Hyperglycemia 12/23/2015   Increased thyroid  stimulating hormone (TSH) level 06/07/2017   Ingrown left big toenail 02/14/2015   Left foot pain 02/14/2015   MVP (mitral valve prolapse) 05/14/2016   Parkinson's disease (HCC) 12/23/2015   Posterior vitreous detachment of both eyes 08/21/2022   Primary open-angle  glaucoma, left eye, severe stage 05/05/2021   Primary open-angle glaucoma, right eye, moderate stage 01/10/2016   Pseudophakia, both eyes 08/21/2022   Right hip pain 12/12/2016   Stage 3 chronic kidney disease 12/25/2011   Thrombocytopenia 02/08/2015   Tremor 02/14/2015    Assessment: Patient Reported Symptoms:  Cognitive Cognitive Status: Able to follow simple commands Cognitive/Intellectual Conditions Management [RPT]: None reported or documented in medical history or problem list      Neurological Neurological Review of Symptoms: Weakness Neurological Management Strategies: Adequate rest, Medication therapy  HEENT HEENT Symptoms Reported: No symptoms reported      Cardiovascular Cardiovascular Symptoms Reported: No symptoms reported Does patient have uncontrolled Hypertension?: No    Respiratory Respiratory Symptoms Reported: No symptoms reported    Endocrine Endocrine Symptoms Reported: No symptoms reported    Gastrointestinal Gastrointestinal Symptoms Reported: No symptoms reported      Genitourinary Genitourinary Symptoms Reported: No symptoms reported    Integumentary Integumentary Symptoms Reported: No symptoms reported    Musculoskeletal Musculoskelatal Symptoms Reviewed: Difficulty walking, Weakness        Psychosocial       Quality of Family Relationships: helpful, involved, supportive Do you feel physically threatened by others?: No      07/19/2022    9:06 AM  Depression screen PHQ 2/9  Decreased Interest 0  Down, Depressed, Hopeless 0  PHQ - 2 Score 0    There were no vitals filed for this visit.  Medications Reviewed Today   Medications were not reviewed in this  encounter     Recommendation:   Continue to collaborate with home health agency regarding visits.   Follow Up Plan:   Patient care needs met. Referral closed. Pt will contact should additional needs arise.   Vincent Walker MSW, LCSW Licensed Clinical Social Worker  [facility], Population Health Direct Dial: [phone]  Fax: [phone]

## 2023-09-30 NOTE — Patient Instructions (Signed)

## 2023-09-30 NOTE — Progress Notes (Signed)
Patient reviewed via MyChart.

## 2023-09-30 NOTE — Progress Notes (Signed)
 Office Visit Note   Patient: Vincent Walker           Date of Birth: [birth year]           MRN: [id number] Visit Date: 09/24/2023              Requested by: Domenica Harlene LABOR, MD [address] [place],  [place] [address] PCP: Domenica Harlene LABOR, MD  Chief Complaint  Patient presents with   Right Foot - Follow-up      HPI: Patient is an 78 year old gentleman who presents in follow-up for ulceration left foot.  Patient states he has developed a new blister on the top of his foot.  Denies any pain states he has increased swelling with minimal drainage.  Assessment & Plan: Visit Diagnoses:  1. Cellulitis of left foot   2. Ulcer of left foot, limited to breakdown of skin (HCC)     Plan: Recommended continue with elevation and compression and exercise.  Follow-Up Instructions: Return in about 2 weeks (around 10/08/2023).   Ortho Exam  Patient is alert, oriented, no adenopathy, well-dressed, normal affect, normal respiratory effort. Examination there is no cellulitis no signs of infection.  The calf edema is resolving there are no calf ulcers.  The foot does have increased swelling with superficial ulceration of the dorsum of the foot.    Imaging: No results found. No images are attached to the encounter.  Labs: Lab Results  Component Value Date   HGBA1C 6.0 07/09/2023   HGBA1C 6.0 12/27/2020   HGBA1C 6.0 04/07/2019     Lab Results  Component Value Date   ALBUMIN 4.3 09/17/2023   ALBUMIN 4.3 09/15/2023   ALBUMIN 4.4 07/09/2023    No results found for: MG No results found for: VD25OH  No results found for: PREALBUMIN    Latest Ref Rng & Units 09/17/2023    2:17 PM 09/15/2023    3:04 PM 07/09/2023   10:18 AM  CBC EXTENDED  WBC 4.0 - 10.5 K/uL 10.0  10.7  6.9   RBC 4.22 - 5.81 Mil/uL 4.72  4.53  4.65   Hemoglobin 13.0 - 17.0 g/dL 85.3  85.7  85.2   HCT 39.0 - 52.0 % 44.5  42.3  43.8   Platelets 150.0 - 400.0 K/uL 119.0  115  145.0   NEUT# 1.4 - 7.7  K/uL 5.8  6.7  4.0   Lymph# 0.7 - 4.0 K/uL 2.9  2.7  2.3      There is no height or weight on file to calculate BMI.  Orders:  No orders of the defined types were placed in this encounter.  No orders of the defined types were placed in this encounter.    Procedures: No procedures performed  Clinical Data: No additional findings.  ROS:  All other systems negative, except as noted in the HPI. Review of Systems  Objective: Vital Signs: There were no vitals taken for this visit.  Specialty Comments:  No specialty comments available.  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   Posterior vitreous detachment of both eyes 08/21/2022   Pseudophakia, both eyes 08/21/2022   Primary open-angle glaucoma, left eye, severe stage 05/05/2021   Eustachian tube  dysfunction, left 02/21/2021   Early dry stage nonexudative age-related macular degeneration of both eyes 03/09/2019   Adenomatous polyp of ascending colon 10/09/2018   Epistaxis 07/07/2018   Allergies 07/07/2018   Obesity 06/15/2018   Increased thyroid  stimulating hormone (TSH) level 06/07/2017   MVP (mitral valve prolapse) 05/14/2016   Blepharitis of upper and lower eyelids of both eyes 01/10/2016   Primary open-angle glaucoma, right eye, moderate stage 01/10/2016   Parkinson's disease (HCC) 12/23/2015   Hyperglycemia 12/23/2015   Tremor 02/14/2015   Dyslipidemia 02/08/2015   History of renal cell carcinoma 02/08/2015   Thrombocytopenia 02/08/2015   History of chicken pox    Essential hypertension 12/25/2011   Stage 3 chronic kidney disease 12/25/2011   Past Medical History:  Diagnosis Date   Adenomatous polyp of ascending colon 10/09/2018   Allergies 07/07/2018    Arthritis    Astigmatism of both eyes with presbyopia 08/21/2022   Blepharitis of upper and lower eyelids of both eyes 01/10/2016   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 Required nasal packing a couple of days ago.  Had a similar episode of epistaxis a little over a year ago.  At that visit, I could not see the exact bleeding spot. EXAMINATION after packing removal reveals several excoriated areas anteriorly but I was unable to see t   Essential hypertension 12/25/2011   Eustachian tube dysfunction, left 02/21/2021   Flank pain 07/19/2022   History of blood transfusion 2010   After Kidney surgery   History of chicken pox    History of renal cell carcinoma 02/08/2015   diagnosed and removed in 2010 Right Monitored by Dr Tanda Moats of urology at [facility] Dr Claudine Alley, nephrology at [facility]   Hyperglycemia 12/23/2015   Increased thyroid  stimulating hormone (TSH) level 06/07/2017   Ingrown left big toenail 02/14/2015   Left foot pain 02/14/2015   MVP (mitral valve prolapse) 05/14/2016   Parkinson's disease (HCC) 12/23/2015   Posterior vitreous detachment of both eyes 08/21/2022   Primary open-angle glaucoma, left eye, severe stage 05/05/2021   Primary open-angle glaucoma, right eye, moderate stage 01/10/2016   Pseudophakia, both eyes 08/21/2022   Right hip pain 12/12/2016   Stage 3 chronic kidney disease 12/25/2011   Thrombocytopenia 02/08/2015   Tremor 02/14/2015    Family History  Problem Relation Age of Onset   Heart attack Mother    Stroke Mother        swelling in brain stem   Glaucoma Mother    Alcohol abuse Father    Hyperlipidemia Father    Hypertension Father     Diabetes Father    Heart disease Father    Healthy Daughter    Healthy Daughter    Healthy Son    Cancer Maternal Aunt        lung cancer   Cancer Paternal Uncle        bone cancer   Colon cancer Neg Hx    Esophageal cancer Neg Hx    Stomach cancer Neg Hx    Inflammatory bowel disease Neg Hx    Liver disease Neg Hx    Pancreatic cancer Neg Hx    Rectal cancer Neg Hx  Past Surgical History:  Procedure Laterality Date   APPENDECTOMY  1995   CATARACT EXTRACTION, BILATERAL     COLONOSCOPY     COLONOSCOPY WITH PROPOFOL  N/A 12/17/2018   Procedure: COLONOSCOPY WITH PROPOFOL ;  Surgeon: Mansouraty, Aloha Raddle., MD;  Location[facility] ENDOSCOPY;  Service: Gastroenterology;  Laterality: N/A;   ENDOSCOPIC MUCOSAL RESECTION N/A 12/17/2018   Procedure: ENDOSCOPIC MUCOSAL RESECTION;  Surgeon: Wilhelmenia Aloha Raddle., MD;  Location: [facility] ENDOSCOPY;  Service: Gastroenterology;  Laterality: N/A;   HEMOSTASIS CLIP PLACEMENT  12/17/2018   Procedure: HEMOSTASIS CLIP PLACEMENT;  Surgeon: Wilhelmenia Aloha Raddle., MD;  Location: [facility] ENDOSCOPY;  Service: Gastroenterology;;   left knee scope  2003   POLYPECTOMY  12/17/2018   Procedure: POLYPECTOMY;  Surgeon: Mansouraty, Aloha Raddle., MD;  Location: [facility] ENDOSCOPY;  Service: Gastroenterology;;   right knee scope  1999   SUBMUCOSAL LIFTING INJECTION  12/17/2018   Procedure: SUBMUCOSAL LIFTING INJECTION;  Surgeon: Wilhelmenia Aloha Raddle., MD;  Location: [facility] ENDOSCOPY;  Service: Gastroenterology;;   TOE SURGERY Left    metal 2nd toe- and top of foot- straighten bone   TONSILLECTOMY     TOTAL KNEE ARTHROPLASTY Left 07/06/2014   TOTAL NEPHRECTOMY Right    Social History   Occupational History   Occupation: Retired    Comment: Production designer, theatre/television/film in [employer]  Tobacco Use   Smoking status: Former    Types: Pipe    Quit date: 11/18/1995    Years since quitting: 27.8   Smokeless tobacco: Never   Tobacco comments:    stopped 20 years ago.  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

## 2023-10-02 DIAGNOSIS — L84 Corns and callosities: Secondary | ICD-10-CM | POA: Diagnosis not present

## 2023-10-02 DIAGNOSIS — F0280 Dementia in other diseases classified elsewhere without behavioral disturbance: Secondary | ICD-10-CM | POA: Diagnosis not present

## 2023-10-02 DIAGNOSIS — G20A1 Parkinson's disease without dyskinesia, without mention of fluctuations: Secondary | ICD-10-CM | POA: Diagnosis not present

## 2023-10-02 DIAGNOSIS — I129 Hypertensive chronic kidney disease with stage 1 through stage 4 chronic kidney disease, or unspecified chronic kidney disease: Secondary | ICD-10-CM | POA: Diagnosis not present

## 2023-10-02 DIAGNOSIS — L03116 Cellulitis of left lower limb: Secondary | ICD-10-CM | POA: Diagnosis not present

## 2023-10-02 DIAGNOSIS — S90822D Blister (nonthermal), left foot, subsequent encounter: Secondary | ICD-10-CM | POA: Diagnosis not present

## 2023-10-04 ENCOUNTER — Other Ambulatory Visit: Payer: Self-pay | Admitting: Neurology

## 2023-10-04 DIAGNOSIS — G20A1 Parkinson's disease without dyskinesia, without mention of fluctuations: Secondary | ICD-10-CM

## 2023-10-07 ENCOUNTER — Other Ambulatory Visit: Payer: Self-pay

## 2023-10-07 ENCOUNTER — Encounter: Payer: Self-pay | Admitting: Neurology

## 2023-10-07 ENCOUNTER — Other Ambulatory Visit (HOSPITAL_BASED_OUTPATIENT_CLINIC_OR_DEPARTMENT_OTHER): Payer: Self-pay

## 2023-10-07 DIAGNOSIS — G2581 Restless legs syndrome: Secondary | ICD-10-CM

## 2023-10-07 DIAGNOSIS — R82998 Other abnormal findings in urine: Secondary | ICD-10-CM | POA: Diagnosis not present

## 2023-10-07 DIAGNOSIS — N200 Calculus of kidney: Secondary | ICD-10-CM | POA: Diagnosis not present

## 2023-10-07 DIAGNOSIS — G20A1 Parkinson's disease without dyskinesia, without mention of fluctuations: Secondary | ICD-10-CM

## 2023-10-07 DIAGNOSIS — R351 Nocturia: Secondary | ICD-10-CM | POA: Diagnosis not present

## 2023-10-07 DIAGNOSIS — C641 Malignant neoplasm of right kidney, except renal pelvis: Secondary | ICD-10-CM | POA: Diagnosis not present

## 2023-10-07 MED ORDER — INBRIJA 42 MG IN CAPS: ORAL_CAPSULE | RESPIRATORY_TRACT | 2 refills | Status: DC

## 2023-10-07 MED ORDER — MIRABEGRON ER 25 MG PO TB24
25.0000 mg | ORAL_TABLET | Freq: Every day | ORAL | 5 refills | Status: DC
  Filled 2023-10-07: qty 30, 30d supply, fill #0

## 2023-10-07 MED ORDER — TAMSULOSIN HCL 0.4 MG PO CAPS
0.4000 mg | ORAL_CAPSULE | Freq: Every day | ORAL | 0 refills | Status: DC
  Filled 2023-10-07: qty 30, 30d supply, fill #0

## 2023-10-07 MED ORDER — CARBIDOPA-LEVODOPA ER 50-200 MG PO TBCR
1.0000 | EXTENDED_RELEASE_TABLET | Freq: Every day | ORAL | 0 refills | Status: DC
  Filled 2023-10-07: qty 90, 90d supply, fill #0

## 2023-10-08 ENCOUNTER — Ambulatory Visit: Admitting: Orthopedic Surgery

## 2023-10-10 DIAGNOSIS — S90822D Blister (nonthermal), left foot, subsequent encounter: Secondary | ICD-10-CM | POA: Diagnosis not present

## 2023-10-10 DIAGNOSIS — F0280 Dementia in other diseases classified elsewhere without behavioral disturbance: Secondary | ICD-10-CM | POA: Diagnosis not present

## 2023-10-10 DIAGNOSIS — I129 Hypertensive chronic kidney disease with stage 1 through stage 4 chronic kidney disease, or unspecified chronic kidney disease: Secondary | ICD-10-CM | POA: Diagnosis not present

## 2023-10-10 DIAGNOSIS — G20A1 Parkinson's disease without dyskinesia, without mention of fluctuations: Secondary | ICD-10-CM | POA: Diagnosis not present

## 2023-10-10 DIAGNOSIS — L84 Corns and callosities: Secondary | ICD-10-CM | POA: Diagnosis not present

## 2023-10-10 DIAGNOSIS — L03116 Cellulitis of left lower limb: Secondary | ICD-10-CM | POA: Diagnosis not present

## 2023-10-14 ENCOUNTER — Other Ambulatory Visit: Payer: Self-pay | Admitting: Neurology

## 2023-10-14 DIAGNOSIS — G20A1 Parkinson's disease without dyskinesia, without mention of fluctuations: Secondary | ICD-10-CM

## 2023-10-14 DIAGNOSIS — F02A11 Dementia in other diseases classified elsewhere, mild, with agitation: Secondary | ICD-10-CM

## 2023-10-15 ENCOUNTER — Other Ambulatory Visit: Payer: Self-pay

## 2023-10-15 ENCOUNTER — Other Ambulatory Visit (HOSPITAL_BASED_OUTPATIENT_CLINIC_OR_DEPARTMENT_OTHER): Payer: Self-pay

## 2023-10-15 MED ORDER — DIVALPROEX SODIUM ER 250 MG PO TB24
250.0000 mg | ORAL_TABLET | Freq: Every day | ORAL | 0 refills | Status: DC
  Filled 2023-10-15: qty 90, 90d supply, fill #0

## 2023-10-16 DIAGNOSIS — G20A1 Parkinson's disease without dyskinesia, without mention of fluctuations: Secondary | ICD-10-CM | POA: Diagnosis not present

## 2023-10-16 DIAGNOSIS — L03116 Cellulitis of left lower limb: Secondary | ICD-10-CM | POA: Diagnosis not present

## 2023-10-16 DIAGNOSIS — I129 Hypertensive chronic kidney disease with stage 1 through stage 4 chronic kidney disease, or unspecified chronic kidney disease: Secondary | ICD-10-CM | POA: Diagnosis not present

## 2023-10-16 DIAGNOSIS — L84 Corns and callosities: Secondary | ICD-10-CM | POA: Diagnosis not present

## 2023-10-16 DIAGNOSIS — S90822D Blister (nonthermal), left foot, subsequent encounter: Secondary | ICD-10-CM | POA: Diagnosis not present

## 2023-10-16 DIAGNOSIS — F0280 Dementia in other diseases classified elsewhere without behavioral disturbance: Secondary | ICD-10-CM | POA: Diagnosis not present

## 2023-10-17 ENCOUNTER — Other Ambulatory Visit: Payer: Self-pay

## 2023-10-17 DIAGNOSIS — H401123 Primary open-angle glaucoma, left eye, severe stage: Secondary | ICD-10-CM | POA: Diagnosis not present

## 2023-10-17 DIAGNOSIS — H35363 Drusen (degenerative) of macula, bilateral: Secondary | ICD-10-CM | POA: Diagnosis not present

## 2023-10-17 DIAGNOSIS — H04123 Dry eye syndrome of bilateral lacrimal glands: Secondary | ICD-10-CM | POA: Diagnosis not present

## 2023-10-17 DIAGNOSIS — H353132 Nonexudative age-related macular degeneration, bilateral, intermediate dry stage: Secondary | ICD-10-CM | POA: Diagnosis not present

## 2023-10-17 DIAGNOSIS — H0100A Unspecified blepharitis right eye, upper and lower eyelids: Secondary | ICD-10-CM | POA: Diagnosis not present

## 2023-10-17 DIAGNOSIS — H401112 Primary open-angle glaucoma, right eye, moderate stage: Secondary | ICD-10-CM | POA: Diagnosis not present

## 2023-10-17 DIAGNOSIS — H0100B Unspecified blepharitis left eye, upper and lower eyelids: Secondary | ICD-10-CM | POA: Diagnosis not present

## 2023-10-17 DIAGNOSIS — Z83511 Family history of glaucoma: Secondary | ICD-10-CM | POA: Diagnosis not present

## 2023-10-21 DIAGNOSIS — R82998 Other abnormal findings in urine: Secondary | ICD-10-CM | POA: Diagnosis not present

## 2023-10-21 DIAGNOSIS — N200 Calculus of kidney: Secondary | ICD-10-CM | POA: Diagnosis not present

## 2023-10-22 DIAGNOSIS — N200 Calculus of kidney: Secondary | ICD-10-CM | POA: Diagnosis not present

## 2023-10-23 ENCOUNTER — Other Ambulatory Visit (HOSPITAL_BASED_OUTPATIENT_CLINIC_OR_DEPARTMENT_OTHER): Payer: Self-pay

## 2023-10-24 ENCOUNTER — Other Ambulatory Visit (HOSPITAL_BASED_OUTPATIENT_CLINIC_OR_DEPARTMENT_OTHER): Payer: Self-pay

## 2023-10-24 DIAGNOSIS — R351 Nocturia: Secondary | ICD-10-CM | POA: Diagnosis not present

## 2023-10-24 DIAGNOSIS — N200 Calculus of kidney: Secondary | ICD-10-CM | POA: Diagnosis not present

## 2023-10-24 DIAGNOSIS — R937 Abnormal findings on diagnostic imaging of other parts of musculoskeletal system: Secondary | ICD-10-CM | POA: Diagnosis not present

## 2023-10-24 DIAGNOSIS — C641 Malignant neoplasm of right kidney, except renal pelvis: Secondary | ICD-10-CM | POA: Diagnosis not present

## 2023-10-24 MED ORDER — MIRABEGRON ER 50 MG PO TB24
50.0000 mg | ORAL_TABLET | Freq: Every day | ORAL | 5 refills | Status: DC
  Filled 2023-10-24: qty 30, 30d supply, fill #0
  Filled 2023-11-20: qty 30, 30d supply, fill #1
  Filled 2023-12-20: qty 30, 30d supply, fill #2
  Filled 2024-01-20: qty 30, 30d supply, fill #3
  Filled 2024-02-14: qty 30, 30d supply, fill #4
  Filled 2024-03-16: qty 30, 30d supply, fill #5

## 2023-10-30 DIAGNOSIS — M47816 Spondylosis without myelopathy or radiculopathy, lumbar region: Secondary | ICD-10-CM | POA: Diagnosis not present

## 2023-10-30 DIAGNOSIS — M4807 Spinal stenosis, lumbosacral region: Secondary | ICD-10-CM | POA: Diagnosis not present

## 2023-10-30 DIAGNOSIS — M48061 Spinal stenosis, lumbar region without neurogenic claudication: Secondary | ICD-10-CM | POA: Diagnosis not present

## 2023-10-30 DIAGNOSIS — R937 Abnormal findings on diagnostic imaging of other parts of musculoskeletal system: Secondary | ICD-10-CM | POA: Diagnosis not present

## 2023-11-11 NOTE — Progress Notes (Signed)
 Assessment/Plan:   1.  Parkinsons Disease             -Continue carbidopa /levodopa  25/100 IR, 2 at 8am/2 at 11am/2 at 2pm/1 at 6pm.             -Continue carbidopa /levodopa  50/200 at bedtime             -discussed walker at all times             -refer to PT  -Pt and I discussed Vyalev.  Discussed r/b/se.  Patient has motor fluctuations, including dyskinesia and bradykinesia and has at least 2.5 hours of off time per day.  Patient could certainly benefit from 24 hour/day infusion therapy.  He and his wife are going to talk it over and call me back if they are interested. 2.  RBD/RLS/insomnia             -Continue clonazepam  0.5 mg, full tablet at bedtime.  Tried to stop this medication and things got much worse, especially mood change and daytime hypersomnolence did not get better.  Lower dosages resulted in increasing RBD symptoms.  PDMP is reviewed.  Refilled today.             -He is using Inbrija  for this as well.  Takes it right before bedtime and finds it useful.             -on mirtazapine , 15 mg q hs.               -some of night issues due to nocturia.  He is on several medications for nocturia 3.  Anxiety/depression             -Being followed by primary care.  On Depakote  ER, 250 mg daily.  May need to increase this in the future.  Nighttime agitation is becoming more problematic 4.  Daytime hypersomnolence             -Nocturnal polysomnogram was negative, but he was awake most of the night so not sure that it was accurate 5.  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             -Has declined Nuplazid thus far             - continue Donepezil , 10 mg daily.  This has helped hallucinations             -encouraged counseling for wife as caregiving burden increases.  Much of the discussion centered around respite care for the wife as well as bringing in care for the patient. 6.  AM dizziness              -don't think related to Parkinsons Disease meds as it starts in the AM prior to taking meds and doesn't persist in the day             -he has d/c the amlodipine              -He is on low-dose atenolol  for PVCs.  They have decreased the dose of time.  They don't think that they can decrease further d/t pvc's.  7.  Renal insuff             -likely due to dehydration             -may contribute to #6 8.  nocturia  - Now following with urology and on Myrbetriq  and flomax .  This is helping although not resolved.  Subjective:   Vincent Walker was seen today in follow up for Parkinsons disease.  My previous records were reviewed prior to todays visit as well as outside records available to me. Pt with wife who supplements hx. donepezil  was started last visit for memory and some mild hallucinations and he reports that this is better with the donepezil  (it was increased to 10 mg by pcp).  They did email me mid June to state that the patient was falling.  He was also having some acute bladder incontinence.  It turns out that the patient had cellulitis of his left foot and an ulcer on the bottom of the foot, which made walking difficult.  He was treated by both primary care and orthopedics.  It has now resolved completely.   He saw urology nurse practitioner regarding nocturia and they trialed him on Myrbetriq  and he reports today that this is helping with the flomax .  He is not exercising.  His only falls were the 2 falls associated with his foot ulcer - wife notes that he was favoring the other foot and fell to the right.  He has been somewhat more off balance since.  Wife notes at least 3 hours of off time per day, esp in the late afternoon.  He also has trouble in the early morning (4-7am).      Current prescribed movement disorder medications: carbidopa /levodopa  25/100 , 2 at 8am/2 at 11am/2 at 2pm/1 at 6pm Clonazepam  0.5 mg, 1 tablet at bedtime  carbidopa /levodopa  50/200 at bedtime Donepezil   (started last visit) Mirtazapine , 15 mg nightly Depakote , 250 mg daily Inbrija    PREVIOUS MEDICATIONS: Carbidopa /levodopa  25/100 IR; carbidopa /levodopa  25/100 CR (tried to change to see if would help EDS and didn't so changed back)  ALLERGIES:   Allergies  Allergen Reactions   Nsaids    Sulfa Antibiotics Other (See Comments)    Had a reaction as a child   Tolmetin Other (See Comments)    CURRENT MEDICATIONS:  Outpatient Encounter Medications as of 11/13/2023  Medication Sig   acetaminophen  (TYLENOL ) 500 MG tablet Take 1,000 mg by mouth every 6 (six) hours as needed (pain.).    atenolol  (TENORMIN ) 25 MG tablet Take 0.5 tablets (12.5 mg total) by mouth daily.   atorvastatin  (LIPITOR) 20 MG tablet Take 1 tablet (20 mg total) by mouth daily.   Brinzolamide -Brimonidine  (SIMBRINZA ) 1-0.2 % SUSP Place 1 drop into both eyes 3 (three) times daily.   carbidopa -levodopa  (SINEMET  CR) 50-200 MG tablet Take 1 tablet by mouth at bedtime.   carbidopa -levodopa  (SINEMET  IR) 25-100 MG tablet Take 2 tablets by mouth at 8am, 2 tablets at 11am, 2 tablets at 2pm, and 1 tablet at 6pm.   clonazePAM  (KLONOPIN ) 0.5 MG tablet Take 1 tablet (0.5 mg total) by mouth at bedtime.   divalproex  (DEPAKOTE  ER) 250 MG 24 hr tablet Take 1 tablet (250 mg total) by mouth daily.   donepezil  (ARICEPT ) 10 MG tablet Take 1 tablet (10 mg total) by mouth at bedtime.   ketoconazole  (NIZORAL ) 2 % shampoo Use every other day, applying directly to damp scalp, lather, let sit 5-10 minutes and rinse.   Levodopa  (INBRIJA ) 42 MG CAPS TWO capsules is ONE dosage (never inhale just one capsule). You can inhale the capsules as needed up to 5 times per day, separated by 2 hour intervals.   MAGNESIUM GLYCINATE PO Take 200 mg  by mouth at bedtime.   mirabegron  ER (MYRBETRIQ ) 50 MG TB24 tablet Take 1 tablet (50 mg total) by mouth daily.   mirtazapine  (REMERON ) 15 MG tablet Take 1 tablet (15 mg total) by mouth at bedtime.   Multiple  Vitamins-Minerals (PRESERVISION AREDS 2 PO) Take 1 tablet by mouth daily.   Polyethyl Glycol-Propyl Glycol (LUBRICANT EYE DROPS) 0.4-0.3 % SOLN Place 1 drop into both eyes 3 (three) times daily as needed (dry/irritated eyes.).   tamsulosin  (FLOMAX ) 0.4 MG CAPS capsule Take 1 capsule (0.4 mg total) by mouth daily.   No facility-administered encounter medications on file as of 11/13/2023.    Objective:   PHYSICAL EXAMINATION:    VITALS:   Vitals:   11/13/23 0948  BP: 106/68  Pulse: 68  SpO2: 98%  Weight: 206 lb (93.4 kg)  Height: 6' 3 (1.905 m)    Wt Readings from Last 3 Encounters:  11/13/23 206 lb (93.4 kg)  07/08/23 207 lb 12.8 oz (94.3 kg)  02/25/23 207 lb 9.6 oz (94.2 kg)   GEN:  The patient appears stated age and is in NAD. HEENT:  Normocephalic, atraumatic.  The mucous membranes are moist. The superficial temporal arteries are without ropiness or tenderness. CV:  RRR Lungs:  CTAB Neck/HEME:  There are no carotid bruits bilaterally.  Neurological examination:  Orientation: The patient is alert and oriented x3 but looks to wife for finer aspects Cranial nerves: There is good facial symmetry with facial hypomimia. The speech is fluent and clear. Soft palate rises symmetrically and there is no tongue deviation. Hearing is intact to conversational tone. Sensation: Sensation is intact to light touch throughout Motor: Strength is at least antigravity x4.  Movement examination: Tone: There is normal tone today in the upper and lower extremities. Abnormal movements: there is mild RUE rest tremor.   Coordination:  There is apraxia with questions of RAMs but he does have decremation bilaterally Gait and Station: The patient pushes off to arise.  He uses his walker.  He is standing fairly well with the walker.  Turns en bloc.  I have reviewed and interpreted the following labs independently    Chemistry      Component Value Date/Time   NA 138 09/17/2023 1417   K 4.5  09/17/2023 1417   CL 105 09/17/2023 1417   CO2 27 09/17/2023 1417   BUN 20 09/17/2023 1417   CREATININE 1.39 09/17/2023 1417   CREATININE 1.49 (H) 09/11/2023 1312      Component Value Date/Time   CALCIUM  9.6 09/17/2023 1417   ALKPHOS 85 09/17/2023 1417   AST 25 09/17/2023 1417   ALT 12 09/17/2023 1417   BILITOT 0.7 09/17/2023 1417       Lab Results  Component Value Date   WBC 10.0 09/17/2023   HGB 14.6 09/17/2023   HCT 44.5 09/17/2023   MCV 94.2 09/17/2023   PLT 119.0 (L) 09/17/2023    Lab Results  Component Value Date   TSH 2.45 07/09/2023     Total time spent on today's visit was 47 minutes, including both face-to-face time and nonface-to-face time.  Time included that spent on review of records (prior notes available to me/labs/imaging if pertinent), discussing treatment and goals, answering patient's questions and coordinating care.  Cc:  Domenica Harlene LABOR, MD

## 2023-11-13 ENCOUNTER — Encounter: Payer: Self-pay | Admitting: Neurology

## 2023-11-13 ENCOUNTER — Ambulatory Visit (INDEPENDENT_AMBULATORY_CARE_PROVIDER_SITE_OTHER): Admitting: Neurology

## 2023-11-13 VITALS — BP 106/68 | HR 68 | Ht 75.0 in | Wt 206.0 lb

## 2023-11-13 DIAGNOSIS — F02A3 Dementia in other diseases classified elsewhere, mild, with mood disturbance: Secondary | ICD-10-CM

## 2023-11-13 DIAGNOSIS — G20A1 Parkinson's disease without dyskinesia, without mention of fluctuations: Secondary | ICD-10-CM

## 2023-11-13 DIAGNOSIS — G20A2 Parkinson's disease without dyskinesia, with fluctuations: Secondary | ICD-10-CM

## 2023-11-13 DIAGNOSIS — R351 Nocturia: Secondary | ICD-10-CM

## 2023-11-13 NOTE — Patient Instructions (Signed)
 We discussed vyalev.    Register NOW!  We are planning a Parkinsons Disease educational symposium at [facility], [address] [place], [place], [place] [address] on September 19.  We will have a movement disorder physician expert from [employer] coming to speak and a caregiver speaker.  We will have a panel of experts that will show you who you may need on your team of people on your journey with Parkinsons.  I hope to see you there!  Use this QR code to register by scanning it with the camera app on your phone:      Need more help with registration?  Contact [email or web address]

## 2023-11-14 ENCOUNTER — Encounter: Payer: Self-pay | Admitting: Neurology

## 2023-11-14 ENCOUNTER — Other Ambulatory Visit (HOSPITAL_BASED_OUTPATIENT_CLINIC_OR_DEPARTMENT_OTHER): Payer: Self-pay

## 2023-11-14 MED ORDER — TAMSULOSIN HCL 0.4 MG PO CAPS
0.4000 mg | ORAL_CAPSULE | Freq: Every day | ORAL | 0 refills | Status: DC
  Filled 2023-11-14: qty 30, 30d supply, fill #0

## 2023-11-15 NOTE — Progress Notes (Unsigned)
 Virtual Visit Via Video       Consent was obtained for video visit:  {yes no:[id number]} Answered questions that patient had about telehealth interaction:  {yes no:[id number]} I discussed the limitations, risks, security and privacy concerns of performing an evaluation and management service by telemedicine. I also discussed with the patient that there may be a patient responsible charge related to this service. The patient expressed understanding and agreed to proceed.  Pt location: Home Physician Location: office Name of referring provider:  Domenica Harlene LABOR, MD I connected with Lorrene GORMAN Molt at patients initiation/request on 11/18/2023 at  8:45 AM EDT by video enabled telemedicine application and verified that I am speaking with the correct person using two identifiers. Pt MRN:  [id number] Pt DOB:  22-Jul-1945 Video Participants:  Lorrene GORMAN Molt;  ***  Assessment/Plan:   1.  Parkinsons Disease             -Continue carbidopa /levodopa  25/100 IR, 2 at 8am/2 at 11am/2 at 2pm/1 at 6pm.             -Continue carbidopa /levodopa  50/200 at bedtime             -discussed walker at all times             -refer to PT  -Pt and I discussed Vyalev.  Discussed r/b/se.  Patient has motor fluctuations, including dyskinesia and bradykinesia and has at least 2.5 hours of off time per day.  Patient could certainly benefit from 24 hour/day infusion therapy.  That several questions about the infusion today.  We discussed that this infusion is just an infusion of levodopa .  The risks it carries are really the same as the risks of levodopa , with the exception of risks of skin infection and skin nodules.  We discussed that the delivery system is really what is different.  I asked if they could go back to oral pills if they did not find this to be valuable or if they had issues, and I told them that they could, but I would certainly give this a good trial period, as it may take a little bit of time to get the right infusion  rate. 2.  RBD/RLS/insomnia             -Continue clonazepam  0.5 mg, full tablet at bedtime.  Tried to stop this medication and things got much worse, especially mood change and daytime hypersomnolence did not get better.  Lower dosages resulted in increasing RBD symptoms.  PDMP is reviewed.  Refilled today.             -He is using Inbrija  for this as well.  Takes it right before bedtime and finds it useful.             -on mirtazapine , 15 mg q hs.               -some of night issues due to nocturia.  He is on several medications for nocturia 3.  Anxiety/depression             -Being followed by primary care.  On Depakote  ER, 250 mg daily.  May need to increase this in the future.  Nighttime agitation is becoming more problematic 4.  Daytime hypersomnolence             -Nocturnal polysomnogram was negative, but he was awake most of the night so not sure that it was accurate 5.  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             -Has declined Nuplazid thus far             - continue Donepezil , 10 mg daily.  This has helped hallucinations             -encouraged counseling for wife as caregiving burden increases.  Much of the discussion centered around respite care for the wife as well as bringing in care for the patient. 6.  AM dizziness             -don't think related to Parkinsons Disease meds as it starts in the AM prior to taking meds and doesn't persist in the day             -he has d/c the amlodipine              -He is on low-dose atenolol  for PVCs.  They have decreased the dose of time.  They don't think that they can decrease further d/t pvc's.  7.  Renal insuff             -likely due to dehydration             -may contribute to #6 8.  nocturia  - Now following with urology and on Myrbetriq  and flomax .  This is helping although not resolved.  Subjective:   Vincent Walker was seen today in  follow up for Parkinsons disease.  My previous records were reviewed prior to todays visit as well as outside records available to me. Pt with wife who supplements hx. I just saw the patient at the end of last week.  We talked extensively about Vyalev.  They emailed me and wanted to discuss a few other further things.  They were worried about glaucoma risks.  They were worried about whether or not they could go back to oral medication if the infusion did not seem to be helpful.  Current prescribed movement disorder medications: carbidopa /levodopa  25/100 , 2 at 8am/2 at 11am/2 at 2pm/1 at 6pm Clonazepam  0.5 mg, 1 tablet at bedtime  carbidopa /levodopa  50/200 at bedtime Donepezil  (started last visit) Mirtazapine , 15 mg nightly Depakote , 250 mg daily Inbrija    PREVIOUS MEDICATIONS: Carbidopa /levodopa  25/100 IR; carbidopa /levodopa  25/100 CR (tried to change to see if would help EDS and didn't so changed back)  ALLERGIES:   Allergies  Allergen Reactions   Nsaids    Sulfa Antibiotics Other (See Comments)    Had a reaction as a child   Tolmetin Other (See Comments)    CURRENT MEDICATIONS:  Outpatient Encounter Medications as of 11/18/2023  Medication Sig   acetaminophen  (TYLENOL ) 500 MG tablet Take 1,000 mg by mouth every 6 (six) hours as needed (pain.).    atenolol  (TENORMIN ) 25 MG tablet Take 0.5 tablets (12.5 mg total) by mouth daily.   atorvastatin  (LIPITOR) 20 MG tablet Take 1 tablet (20 mg total) by mouth daily.   Brinzolamide -Brimonidine  (SIMBRINZA ) 1-0.2 % SUSP Place 1 drop into both eyes 3 (three) times daily.   carbidopa -levodopa  (SINEMET  CR) 50-200 MG tablet Take 1 tablet by mouth at bedtime.   carbidopa -levodopa  (SINEMET  IR) 25-100 MG tablet Take 2 tablets by mouth at 8am, 2 tablets at 11am, 2 tablets at 2pm, and 1 tablet at 6pm.   clonazePAM  (KLONOPIN ) 0.5 MG tablet Take 1 tablet (0.5 mg total) by mouth at bedtime.   divalproex  (DEPAKOTE   ER) 250 MG 24 hr tablet Take 1 tablet (250  mg total) by mouth daily.   donepezil  (ARICEPT ) 10 MG tablet Take 1 tablet (10 mg total) by mouth at bedtime.   ketoconazole  (NIZORAL ) 2 % shampoo Use every other day, applying directly to damp scalp, lather, let sit 5-10 minutes and rinse.   Levodopa  (INBRIJA ) 42 MG CAPS TWO capsules is ONE dosage (never inhale just one capsule). You can inhale the capsules as needed up to 5 times per day, separated by 2 hour intervals.   MAGNESIUM GLYCINATE PO Take 200 mg by mouth at bedtime.   mirabegron  ER (MYRBETRIQ ) 50 MG TB24 tablet Take 1 tablet (50 mg total) by mouth daily.   mirtazapine  (REMERON ) 15 MG tablet Take 1 tablet (15 mg total) by mouth at bedtime.   Multiple Vitamins-Minerals (PRESERVISION AREDS 2 PO) Take 1 tablet by mouth daily.   Polyethyl Glycol-Propyl Glycol (LUBRICANT EYE DROPS) 0.4-0.3 % SOLN Place 1 drop into both eyes 3 (three) times daily as needed (dry/irritated eyes.).   tamsulosin  (FLOMAX ) 0.4 MG CAPS capsule Take 1 capsule (0.4 mg total) by mouth daily.   No facility-administered encounter medications on file as of 11/18/2023.    Objective:   PHYSICAL EXAMINATION:    VITALS:   There were no vitals filed for this visit.   Wt Readings from Last 3 Encounters:  11/13/23 206 lb (93.4 kg)  07/08/23 207 lb 12.8 oz (94.3 kg)  02/25/23 207 lb 9.6 oz (94.2 kg)   GEN:  The patient appears stated age and is in NAD. HEENT:  Normocephalic, atraumatic.  The mucous membranes are moist. The superficial temporal arteries are without ropiness or tenderness. CV:  RRR Lungs:  CTAB Neck/HEME:  There are no carotid bruits bilaterally.  Neurological examination:  Orientation: The patient is alert and oriented x3 but looks to wife for finer aspects Cranial nerves: There is good facial symmetry with facial hypomimia. The speech is fluent and clear. Soft palate rises symmetrically and there is no tongue deviation. Hearing is intact to conversational tone. Sensation: Sensation is intact to  light touch throughout Motor: Strength is at least antigravity x4.  Movement examination: Tone: There is normal tone today in the upper and lower extremities. Abnormal movements: there is mild RUE rest tremor.   Coordination:  There is apraxia with questions of RAMs but he does have decremation bilaterally Gait and Station: The patient pushes off to arise.  He uses his walker.  He is standing fairly well with the walker.  Turns en bloc.  I have reviewed and interpreted the following labs independently    Chemistry      Component Value Date/Time   NA 138 09/17/2023 1417   K 4.5 09/17/2023 1417   CL 105 09/17/2023 1417   CO2 27 09/17/2023 1417   BUN 20 09/17/2023 1417   CREATININE 1.39 09/17/2023 1417   CREATININE 1.49 (H) 09/11/2023 1312      Component Value Date/Time   CALCIUM  9.6 09/17/2023 1417   ALKPHOS 85 09/17/2023 1417   AST 25 09/17/2023 1417   ALT 12 09/17/2023 1417   BILITOT 0.7 09/17/2023 1417       Lab Results  Component Value Date   WBC 10.0 09/17/2023   HGB 14.6 09/17/2023   HCT 44.5 09/17/2023   MCV 94.2 09/17/2023   PLT 119.0 (L) 09/17/2023    Lab Results  Component Value Date   TSH 2.45 07/09/2023   Follow up Instructions      -  I discussed the assessment and treatment plan with the patient. The patient was provided an opportunity to ask questions and all were answered. The patient agreed with the plan and demonstrated an understanding of the instructions.   The patient was advised to call back or seek an in-person evaluation if the symptoms worsen or if the condition fails to improve as anticipated.    Total time spent on today's visit was ***minutes, including both face-to-face time and nonface-to-face time.  Time included that spent on review of records (prior notes available to me/labs/imaging if pertinent), discussing treatment and goals, answering patient's questions and coordinating care.   Asberry Schneider, DO   Cc:  Domenica Harlene LABOR, MD

## 2023-11-18 ENCOUNTER — Encounter: Payer: Self-pay | Admitting: Neurology

## 2023-11-18 ENCOUNTER — Telehealth: Admitting: Neurology

## 2023-11-18 ENCOUNTER — Other Ambulatory Visit: Payer: Self-pay

## 2023-11-18 DIAGNOSIS — G20A1 Parkinson's disease without dyskinesia, without mention of fluctuations: Secondary | ICD-10-CM

## 2023-11-18 DIAGNOSIS — G20B2 Parkinson's disease with dyskinesia, with fluctuations: Secondary | ICD-10-CM

## 2023-11-19 NOTE — Therapy (Signed)
 OUTPATIENT PHYSICAL THERAPY PARKINSON'S EVALUATION   Patient Name: Vincent Walker MRN: [id number] DOB:[birth year], 78 y.o., male Today's Date: 11/28/2023   END OF SESSION:  PT End of Session - 11/28/23 1448     Visit Number 1    Date for PT Re-Evaluation 02/20/24    Authorization Type Medicare & Federal BCBS    PT Start Time 1448    PT Stop Time 1539    PT Time Calculation (min) 51 min    Activity Tolerance Patient tolerated treatment well    Behavior During Therapy [facility] for tasks assessed/performed;Flat affect          Past Medical History:  Diagnosis Date   Adenomatous polyp of ascending colon 10/09/2018   Allergies 07/07/2018   Arthritis    Astigmatism of both eyes with presbyopia 08/21/2022   Blepharitis of upper and lower eyelids of both eyes 01/10/2016   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 Required nasal packing a couple of days ago.  Had a similar episode of epistaxis a little over a year ago.  At that visit, I could not see the exact bleeding spot. EXAMINATION after packing removal reveals several excoriated areas anteriorly but I was unable to see t   Essential hypertension 12/25/2011   Eustachian tube dysfunction, left 02/21/2021   Flank pain 07/19/2022   History of blood transfusion 2010   After Kidney surgery   History of chicken pox    History of renal cell carcinoma 02/08/2015   diagnosed and removed in 2010 Right Monitored by Dr Tanda Moats of urology at [facility] Dr Claudine Alley, nephrology at [facility]   Hyperglycemia 12/23/2015   Increased thyroid  stimulating hormone (TSH) level 06/07/2017   Ingrown left big toenail 02/14/2015   Left foot pain 02/14/2015    MVP (mitral valve prolapse) 05/14/2016   Parkinson's disease (HCC) 12/23/2015   Posterior vitreous detachment of both eyes 08/21/2022   Primary open-angle glaucoma, left eye, severe stage 05/05/2021   Primary open-angle glaucoma, right eye, moderate stage 01/10/2016   Pseudophakia, both eyes 08/21/2022   Right hip pain 12/12/2016   Stage 3 chronic kidney disease 12/25/2011   Thrombocytopenia 02/08/2015   Tremor 02/14/2015   Past Surgical History:  Procedure Laterality Date   APPENDECTOMY  1995   CATARACT EXTRACTION, BILATERAL     COLONOSCOPY     COLONOSCOPY WITH PROPOFOL  N/A 12/17/2018   Procedure: COLONOSCOPY WITH PROPOFOL ;  Surgeon: Wilhelmenia Aloha Raddle., MD;  Location[facility] ENDOSCOPY;  Service: Gastroenterology;  Laterality: N/A;   ENDOSCOPIC MUCOSAL RESECTION N/A 12/17/2018   Procedure: ENDOSCOPIC MUCOSAL RESECTION;  Surgeon: Wilhelmenia Aloha Raddle., MD;  Location: [facility] ENDOSCOPY;  Service: Gastroenterology;  Laterality: N/A;   HEMOSTASIS CLIP PLACEMENT  12/17/2018   Procedure: HEMOSTASIS CLIP PLACEMENT;  Surgeon: Wilhelmenia Aloha Raddle., MD;  Location: [facility] ENDOSCOPY;  Service: Gastroenterology;;   left knee scope  2003   POLYPECTOMY  12/17/2018   Procedure: POLYPECTOMY;  Surgeon: Wilhelmenia Aloha Raddle., MD;  Location: [facility] ENDOSCOPY;  Service: Gastroenterology;;   right knee scope  1999   SUBMUCOSAL LIFTING INJECTION  12/17/2018   Procedure: SUBMUCOSAL LIFTING INJECTION;  Surgeon: Wilhelmenia Aloha Raddle., MD;  Location: [facility] ENDOSCOPY;  Service: Gastroenterology;;   TOE SURGERY Left  metal 2nd toe- and top of foot- straighten bone   TONSILLECTOMY     TOTAL KNEE ARTHROPLASTY Left 07/06/2014   TOTAL NEPHRECTOMY Right    Patient Active Problem List   Diagnosis Date Noted   Cellulitis of left foot 09/17/2023   Dementia due to Parkinson's disease 11/20/2022   Dry eye syndrome of both eyes 08/28/2022   Astigmatism of both eyes with presbyopia 08/21/2022   Choroidal nevus of right  eye 08/21/2022   Posterior vitreous detachment of both eyes 08/21/2022   Pseudophakia, both eyes 08/21/2022   Primary open-angle glaucoma, left eye, severe stage 05/05/2021   Eustachian tube dysfunction, left 02/21/2021   Early dry stage nonexudative age-related macular degeneration of both eyes 03/09/2019   Adenomatous polyp of ascending colon 10/09/2018   Epistaxis 07/07/2018   Allergies 07/07/2018   Obesity 06/15/2018   Increased thyroid  stimulating hormone (TSH) level 06/07/2017   MVP (mitral valve prolapse) 05/14/2016   Blepharitis of upper and lower eyelids of both eyes 01/10/2016   Primary open-angle glaucoma, right eye, moderate stage 01/10/2016   Parkinson's disease (HCC) 12/23/2015   Hyperglycemia 12/23/2015   Tremor 02/14/2015   Dyslipidemia 02/08/2015   History of renal cell carcinoma 02/08/2015   Thrombocytopenia 02/08/2015   History of chicken pox    Essential hypertension 12/25/2011   Stage 3 chronic kidney disease 12/25/2011    PCP: Domenica Harlene LABOR, MD   REFERRING PROVIDER: Evonnie Asberry RAMAN, DO   REFERRING DIAG: G20.A2 (ICD-10-CM) - Parkinson's disease without dyskinesia, with fluctuating manifestations (HCC)   THERAPY DIAG:  Parkinson's disease without dyskinesia, with fluctuations (HCC)  Other abnormalities of gait and mobility  Unsteadiness on feet  Muscle weakness (generalized)  Repeated falls  RATIONALE FOR EVALUATION AND TREATMENT: Rehabilitation  ONSET DATE: PD diagnosis 5+ years ago   NEXT MD VISIT: 04/14/24   SUBJECTIVE:                                                                                                                                                                                                         SUBJECTIVE STATEMENT: Pt reports worsening of his Parkinson's disease. His biggest concern is that he is having a harder time walking and and to switch to a 2WW/RW from his cane.  He states his wife also notes more  forgetfulness/memory issues. He recognizes increased freezing of gait, typcially worse in the mornings and lessening as the day progresses.  He is worried about falling and injuring himself to the point where he will need someone to take care of him.  Pt accompanied by: significant other - wife [facility]) in waiting room   PAIN: Are you having pain? No and Yes: NPRS scale: 0/10 currently, up to 7-8/10  Pain location: low back  Pain description: burning, locked up Aggravating factors: leaning more on RW, prolonged or repetitive bending forward  Relieving factors: sitting upright, lying on the floor or a firm mattress   Are you having pain? 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 Yes. Number of falls 2 (both while using RW to go around a corner)  LIVING ENVIRONMENT: Lives with: lives with their spouse Lives in: House/apartment Stairs: No Has following equipment at home: Single point cane, Environmental consultant - 2 wheeled, Grab bars, and Recumbent Bike  OCCUPATION: Retired  PLOF: Independent with household mobility with device, Independent with community mobility with device, Needs assistance with homemaking, and Leisure: mostly sedentary recently     PATIENT GOALS: To get myself going to help bring back some of what I've lost.   OBJECTIVE: (objective measures completed at initial evaluation unless otherwise dated)  DIAGNOSTIC  FINDINGS:  09/15/23 - CT CERVICAL SPINE WITHOUT CONTRAST (s/p fall, hit right-side) IMPRESSION: 1. No acute cervical spine fracture. 2. Extensive multilevel cervical degenerative changes.  09/15/23 - CT HEAD WITHOUT CONTRAST (s/p fall, hit right-side) IMPRESSION: 1. No acute intracranial process.  09/15/23 - RIGHT SHOULDER - 2+ VIEW (s/p fall, hit right-side) IMPRESSION: 1. No acute fracture. 2. Bone hypertrophy likely from remote trauma to the distal clavicle. Heterotopic bone formation likely from remote trauma to the coracoclavicular ligament. 3. Mild high-riding humeral head which may be secondary to a superior rotator cuff tear. 4. Mild glenohumeral osteoarthritis.  09/15/23 - LEFT FOOT - COMPLETE 3+ VIEW (L foot pain) IMPRESSION: 1. No significant change from prior.  No acute fracture. 2. Mild hallux valgus. 3. Postsurgical changes of second and third PIP arthrodesis.  09/29/23 - MR FOOT LEFT W WO CONTRAST FINDINGS: Postoperative change with metal artifact to the distal second, third, and fourth digits. No fracture or dislocation. No erosions. Mild second tarsometatarsal joint osteoarthritis with joint space narrowing and mild degenerative edema. The visualized marrow signal is otherwise unremarkable.   No obvious soft tissue wound/ulceration. This may be obscured by metal artifact. Moderate diffuse muscle atrophy. Mild likely reactive myositis. The tendons are unremarkable. Moderate to severe dorsal subcutaneous edema. No organized fluid collection.   IMPRESSION: The exam is slightly limited by metal artifact.   Moderate to severe dorsal subcutaneous edema. Correlate for lymphedema versus cellulitis.   No obvious soft tissue wound/ulceration or evidence of osteomyelitis. If continued clinical concern, CT scan and/or bone scan could be performed to further characterize. This would limit the metal artifact.   Diffuse muscle atrophy.   Mild degenerative  change.  COGNITION: Overall cognitive status: Impaired - decreased short-term memory and recall of new information    SENSATION: WFL  COORDINATION: B heel-toe mildly diminished. Impaired heel to shin bilaterally.  MUSCLE TONE: Increased LE muscle  tone noted during attempts at PROM/flexibility assessment.   POSTURE:  rounded shoulders, forward head, increased thoracic kyphosis, and flexed trunk   MUSCLE LENGTH: Hamstrings: mod tight B ITB: mild tight B Piriformis: mod/severe tight B Hip flexors: mod/severe tight B Quads: mild/mod tight B Heelcord: mild tight B  LOWER EXTREMITY ROM:    Mildly limited B hip and ankle ROM (R>L), mostly due to limited muscle flexibility as above.   LOWER EXTREMITY MMT:    MMT Right eval Left eval  Hip flexion 4+ 4+  Hip extension 4- 4  Hip abduction 4 4  Hip adduction 4+ 4+  Hip internal rotation 5 5  Hip external rotation 4+ 4+  Knee flexion 4+ 4+  Knee extension 4+ 4+  Ankle dorsiflexion 4+ 4+  Ankle plantarflexion    Ankle inversion    Ankle eversion    (Blank rows = not tested)  BED MOBILITY:  Minimal assist overall  TRANSFERS: Assistive device utilized: Environmental consultant - 2 wheeled  Sit to stand: Modified independence and SBA - cues to push up from chair rather than pull up on walker Stand to sit: Modified independence Chair to chair: NT Floor: NT  GAIT: Distance walked: Clinic distances Assistive device utilized: Environmental consultant - 2 wheeled Level of assistance: Modified independence Gait pattern: step through pattern, decreased stride length, shuffling, festinating, and trunk flexed Comments: Patient reports intermittent freezing episodes, most common in the morning and subsiding as the day progresses  FUNCTIONAL TESTS:  5 times sit to stand: TBA Timed up and go (TUG): TBA Normal =  Manual =  Cognitive =  10 meter walk test: 23.25 sec with 2WW Gait Speed: 1.41 ft/sec w/o AD, limited community ambulator; gait speed of <1.8 ft/sec  indicates the risk for recurrent falls  Berg Balance Scale: TBA Functional gait assessment: TBA Interpretation of FGA scores: Non-Specific Older Adults Cutoff Score: <=22/30 = risk of falls Parkinson's Disease Cutoff score <15/30= fall risk (Hoehn & Yahr 1-4)  Minimally Clinically Important Difference (MCID)  Stroke (acute, subacute, and chronic) = MDC: 4.2 points Vestibular (acute) = MDC: 6 points Community Dwelling Older Adults =  MCID: 4 points Parkinson's Disease  =  MDC: 4.3 points  (Academy of Neurologic Physical Therapy (nd). Functional Gait Assessment. Retrieved from https://www.neuropt.org/docs/default-source/cpgs/core-outcome-measures/function-gait-assessment-pocket-guide-proof9-(2).pdf?sfvrsn=b43f35043_0.)  Baseline as of D/C from last PT episode (11/20/22): 11/15/22: 5xSTS = 13.88 sec w/o UE assist (occasional hands on knees) TUG:  Normal = 9.93 sec Manual = 10.19 sec Cognitive = 13.16 sec (unable to complete cognitive task) : w/o AD = 10.62 sec, 9.32 sec (fastest comfortable speed w/o AD) with SPC = 11.59 sec with B hiking poles = 11.62 sec Gait speed: 3.09 ft/sec w/o AD  3.52 ft/sec (fastest comfortable speed w/o AD) 2.83 ft/sec with SPC  2.82 ft/sec with B hiking poles    11/20/22: Berg = 50/56, 46-51 moderate risk for falls (>50%)  FGA = 21/30, 19-24 = medium risk for fall   PATIENT SURVEYS:  ABC scale: The Activities-Specific Balance Confidence (ABC) Scale 0% 10 20 30  40 50 60 70 80 90 100% No confidence<->completely confident  "How confident are you that you will not lose your balance or become unsteady when you . . .   Date tested 11/28/23 *confidence with RW  Walk around the house 80%  2. Walk up or down stairs 50%  3. Bend over and pick up a slipper from in front of a closet floor 70%  4. Reach for a small can off a shelf  at eye level 100%  5. Stand on tip toes and reach for something above your head 60%  6. Stand on a chair and reach for  something 0%  7. Sweep the floor 0%  8. Walk outside the house to a car parked in the driveway 80%  9. Get into or out of a car 90%  10. Walk across a parking lot to the mall 70%  11. Walk up or down a ramp 70%  12. Walk in a crowded mall where people rapidly walk past you 60%  13. Are bumped into by people as you walk through the mall 70%  14. Step onto or off of an escalator while you are holding onto the railing 80%  15. Step onto or off an escalator while holding onto parcels such that you cannot hold onto the railing 0%  16. Walk outside on icy sidewalks 0%  Total: #/16 880 / 1600 = 55.0 %  50-80% = moderate level of physical functioning <69% indicates risk for recurrent falls in PD   TODAY'S TREATMENT:   11/19/2023  SELF CARE:  Reviewed eval findings and role of PT in addressing identified deficits as well as need for further assessment of balance.  Provided education on safe hand placement with RW during transfers to reduce fall risk.   PATIENT EDUCATION:  Education details: PT eval findings, anticipated POC, and need for further assessment of standardized balance tests  Person educated: Patient and Spouse Education method: Explanation Education comprehension: verbalized understanding  HOME EXERCISE PROGRAM: TBD  HEP from last PT episode:  Access Code: [email or web address] URL: [email or web address] Date: 11/06/2022 Prepared by: Elijah Hidden   Exercises - Supine Lower Trunk Rotation  - 2 x daily - 7 x weekly - 2 sets - 10 reps - 10 sec hold - Seated Thoracic Lumbar Extension with Pectoralis Stretch  - 2 x daily - 7 x weekly - 2 sets - 10 reps - 5 sec hold - Seated Hamstring Stretch  - 2 x daily - 7 x weekly - 3 reps - 30 sec hold - Seated Figure 4 Piriformis Stretch  - 2 x daily - 7 x weekly - 3 reps - 30 sec hold - Seated Piriformis Stretch  - 2 x daily - 7 x weekly - 3 reps - 30 sec hold - Seated Hip Flexor Stretch  - 2 x daily - 7 x weekly - 3 reps - 30 sec  hold - Standing Gastroc Stretch at Counter  - 2 x daily - 7 x weekly - 3 reps - 30 sec hold - Seated Quadratus Lumborum Stretch in Chair  - 2 x daily - 7 x weekly - 3 reps - 30 sec hold - Seated Flexion Stretch with Swiss Ball  - 2 x daily - 7 x weekly - 3 reps - 30 sec hold - Seated Thoracic Flexion and Rotation with Swiss Ball  - 2 x daily - 7 x weekly - 3 reps - 30 sec hold   Patient Education - Tips to reduce freezing episodes with standing or walking   PWR! Moves: - Supine - Seated - Standing   ASSESSMENT:  CLINICAL IMPRESSION: Vincent Walker is a 78 y.o. male who was referred to physical therapy for evaluation and treatment for Parkinson's disease.  Patient first diagnosed with Parkinson's 5+ years ago.  He has completed 2 prior PT episodes in 2022/2023 and 2024 within the [facility] system.  Most recent PT episode for Parkinson's disease was  09/25/2022 - 11/20/2022.  Since his last PT episode, he reports worsening of his PD leading to increased gait impairments and need to switch from single-point cane to RW/2WW.  Patient presents with physical impairments of decreased timing and coordination of gait, impaired ambulation, impaired standing balance, abnormal posture, bradykinesia with transfers, impaired activity tolerance, LE weakness, postural instability and decreased safety awareness impacting safe and independent functional mobility.  Examination revealed patient is at risk for falls and functional decline as evidenced by the following objective test measures: Gait speed 1.41 ft/sec, (2.62 ft/sec is needed for community access and <1.8 ft/sec is indicative of risk for recurrent falls), which demonstrates a significant decline from gait speed of.  He reports increased incidence of freezing of gait, most common in the morning and subsiding as the day progresses.  He has had 2 falls in the past 6 months, both of which occurred while turning with the RW.  Further balance testing with  5xSTS, TUG, Berg and/or DGI/FGA indicated however patient needing to use the bathroom preventing testing today.  ABC scale score of 55% indicates a moderate level of physical functioning, and score of <69% indicates risk for recurrent falls in PD.  Tyronne will benefit from skilled PT to address above deficits to improve mobility and activity tolerance to help reach the maximal level of functional independence with mobility and gait with reduced risk for falls.  Patient and his wife demonstrates understanding of this POC and are in agreement with this plan.   OBJECTIVE IMPAIRMENTS: Abnormal gait, decreased activity tolerance, decreased balance, decreased coordination, decreased knowledge of condition, decreased knowledge of use of DME, decreased mobility, difficulty walking, decreased ROM, decreased strength, decreased safety awareness, increased fascial restrictions, impaired perceived functional ability, impaired flexibility, improper body mechanics, postural dysfunction, and pain.   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   REHAB POTENTIAL: Good  CLINICAL DECISION MAKING: Unstable/unpredictable  EVALUATION COMPLEXITY: High   GOALS: Goals reviewed with patient? Yes  SHORT TERM GOALS: Target date: 01/09/2024  Patient will be independent with initial HEP. Baseline: TBD Goal status: INITIAL  2.  Patient will  demonstrate decreased fall risk by scoring < 25 sec on TUG. Baseline: TBA Goal status: INITIAL  3.  Patient will be educated on strategies to decrease risk of falls.  Baseline:  Goal status: INITIAL  4.  Patient will verbalize tips to reduce freezing/festination with gait and turns. Baseline: Patient reports freezing episodes most common in the morning, subsiding as the day progresses. Goal status: INITIAL  LONG TERM GOALS: Target date: 02/20/2024  Patient will be independent with ongoing/advanced HEP for self-management at home incorporating PWR! Moves as indicated .  Baseline:  Goal status: INITIAL  2.  Patient will be able to ambulate 600' with LRAD with good safety to access community.  Baseline:  Goal status: INITIAL  3.  Patient will be able to step up/down curb safely with LRAD for safety with community ambulation.  Baseline:  Goal status: INITIAL   4.  Patient will demonstrate gait speed of >/= 1.8  ft/sec (0.55 m/s) to be a safe limited community ambulator with decreased risk for recurrent falls.  Baseline: 1.41 ft/sec with RW Goal status: INITIAL  5.  Patient will improve 5x STS time to </= 16 seconds to demonstrate improved functional strength and transfer efficiency. Baseline: TBA Goal status: INITIAL  6.  Patient will demonstrate at least 19/24 on DGI or 19/30 on FGA to improve gait stability and reduce risk for falls. Baseline: TBA Goal status: INITIAL  7.  Patient will improve Berg score by at least 8 points to improve safety and stability with ADLs in standing and reduce risk for falls.   Baseline: TBA Goal status: INITIAL  8.  Patient will report >/= 69% on ABC scale to demonstrate improved balance confidence and decreased risk for falls. Baseline: 880 / 1600 = 55.0 % Goal status: INITIAL  9. Patient will verbalize understanding of local Parkinson's disease community resources, including community fitness post d/c. Baseline:  Goal status:  INITIAL   PLAN:  PT FREQUENCY: 2x/week  PT DURATION: 12 weeks  PLANNED INTERVENTIONS: [id number]- PT Re-evaluation, 97750- Physical Performance Testing, 97110-Therapeutic exercises, 97530- Therapeutic activity, 97112- Neuromuscular re-education, 97535- Self Care, [id number]- Manual therapy, [phone]- Gait training, [phone]- Electrical stimulation (unattended), 97035- Ultrasound, 02966- Ionotophoresis 4mg /ml Dexamethasone , 79439 (1-2 muscles), 20561 (3+ muscles)- Dry Needling, Patient/Family education, Balance training, Stair training, Taping, Joint mobilization, Spinal mobilization, Cryotherapy, and Moist heat  PLAN FOR NEXT SESSION: Complete standardized balance testing - 5xSTS, TUG, Berg, DGI vs FGA; review stretches from HEP from prior PD PT episode and update/modify as indicated; LE flexibility; core/postural strengthening   Elijah CHRISTELLA Hidden, PT 11/28/2023, 6:01 PM

## 2023-11-24 NOTE — Assessment & Plan Note (Signed)
 Well controlled, no changes to meds. Encouraged heart healthy diet such as the DASH diet and exercise as tolerated.

## 2023-11-24 NOTE — Assessment & Plan Note (Signed)
 Mild, asymptomatic.

## 2023-11-24 NOTE — Assessment & Plan Note (Signed)
 hgba1c acceptable, minimize simple carbs. Increase exercise as tolerated.

## 2023-11-24 NOTE — Assessment & Plan Note (Signed)
 Encouraged heart healthy diet, increase exercise, avoid trans fats, consider a krill oil cap daily

## 2023-11-24 NOTE — Assessment & Plan Note (Signed)
 Hydrate and monitor

## 2023-11-25 ENCOUNTER — Ambulatory Visit (INDEPENDENT_AMBULATORY_CARE_PROVIDER_SITE_OTHER): Admitting: Family Medicine

## 2023-11-25 ENCOUNTER — Encounter: Payer: Self-pay | Admitting: Family Medicine

## 2023-11-25 VITALS — BP 126/78 | HR 60 | Resp 16 | Ht 75.0 in | Wt 206.6 lb

## 2023-11-25 DIAGNOSIS — E785 Hyperlipidemia, unspecified: Secondary | ICD-10-CM

## 2023-11-25 DIAGNOSIS — N183 Chronic kidney disease, stage 3 unspecified: Secondary | ICD-10-CM | POA: Diagnosis not present

## 2023-11-25 DIAGNOSIS — I10 Essential (primary) hypertension: Secondary | ICD-10-CM | POA: Diagnosis not present

## 2023-11-25 DIAGNOSIS — D696 Thrombocytopenia, unspecified: Secondary | ICD-10-CM | POA: Diagnosis not present

## 2023-11-25 DIAGNOSIS — R739 Hyperglycemia, unspecified: Secondary | ICD-10-CM

## 2023-11-25 NOTE — Patient Instructions (Signed)
 Hypertension, Adult High blood pressure (hypertension) is when the force of blood pumping through the arteries is too strong. The arteries are the blood vessels that carry blood from the heart throughout the body. Hypertension forces the heart to work harder to pump blood and may cause arteries to become narrow or stiff. Untreated or uncontrolled hypertension can lead to a heart attack, heart failure, a stroke, kidney disease, and other problems. A blood pressure reading consists of a higher number over a lower number. Ideally, your blood pressure should be below 120/80. The first ("top") number is called the systolic pressure. It is a measure of the pressure in your arteries as your heart beats. The second ("bottom") number is called the diastolic pressure. It is a measure of the pressure in your arteries as the heart relaxes. What are the causes? The exact cause of this condition is not known. There are some conditions that result in high blood pressure. What increases the risk? Certain factors may make you more likely to develop high blood pressure. Some of these risk factors are under your control, including: Smoking. Not getting enough exercise or physical activity. Being overweight. Having too much fat, sugar, calories, or salt (sodium) in your diet. Drinking too much alcohol. Other risk factors include: Having a personal history of heart disease, diabetes, high cholesterol, or kidney disease. Stress. Having a family history of high blood pressure and high cholesterol. Having obstructive sleep apnea. Age. The risk increases with age. What are the signs or symptoms? High blood pressure may not cause symptoms. Very high blood pressure (hypertensive crisis) may cause: Headache. Fast or irregular heartbeats (palpitations). Shortness of breath. Nosebleed. Nausea and vomiting. Vision changes. Severe chest pain, dizziness, and seizures. How is this diagnosed? This condition is diagnosed by  measuring your blood pressure while you are seated, with your arm resting on a flat surface, your legs uncrossed, and your feet flat on the floor. The cuff of the blood pressure monitor will be placed directly against the skin of your upper arm at the level of your heart. Blood pressure should be measured at least twice using the same arm. Certain conditions can cause a difference in blood pressure between your right and left arms. If you have a high blood pressure reading during one visit or you have normal blood pressure with other risk factors, you may be asked to: Return on a different day to have your blood pressure checked again. Monitor your blood pressure at home for 1 week or longer. If you are diagnosed with hypertension, you may have other blood or imaging tests to help your health care provider understand your overall risk for other conditions. How is this treated? This condition is treated by making healthy lifestyle changes, such as eating healthy foods, exercising more, and reducing your alcohol intake. You may be referred for counseling on a healthy diet and physical activity. Your health care provider may prescribe medicine if lifestyle changes are not enough to get your blood pressure under control and if: Your systolic blood pressure is above 130. Your diastolic blood pressure is above 80. Your personal target blood pressure may vary depending on your medical conditions, your age, and other factors. Follow these instructions at home: Eating and drinking  Eat a diet that is high in fiber and potassium, and low in sodium, added sugar, and fat. An example of this eating plan is called the DASH diet. DASH stands for Dietary Approaches to Stop Hypertension. To eat this way: Eat  plenty of fresh fruits and vegetables. Try to fill one half of your plate at each meal with fruits and vegetables. Eat whole grains, such as whole-wheat pasta, brown rice, or whole-grain bread. Fill about one  fourth of your plate with whole grains. Eat or drink low-fat dairy products, such as skim milk or low-fat yogurt. Avoid fatty cuts of meat, processed or cured meats, and poultry with skin. Fill about one fourth of your plate with lean proteins, such as fish, chicken without skin, beans, eggs, or tofu. Avoid pre-made and processed foods. These tend to be higher in sodium, added sugar, and fat. Reduce your daily sodium intake. Many people with hypertension should eat less than 1,500 mg of sodium a day. Do not drink alcohol if: Your health care provider tells you not to drink. You are pregnant, may be pregnant, or are planning to become pregnant. If you drink alcohol: Limit how much you have to: 0-1 drink a day for women. 0-2 drinks a day for men. Know how much alcohol is in your drink. In the [place] one drink equals one 12 oz bottle of beer (355 mL), one 5 oz glass of wine (148 mL), or one 1 oz glass of hard liquor (44 mL). Lifestyle  Work with your health care provider to maintain a healthy body weight or to lose weight. Ask what an ideal weight is for you. Get at least 30 minutes of exercise that causes your heart to beat faster (aerobic exercise) most days of the week. Activities may include walking, swimming, or biking. Include exercise to strengthen your muscles (resistance exercise), such as Pilates or lifting weights, as part of your weekly exercise routine. Try to do these types of exercises for 30 minutes at least 3 days a week. Do not use any products that contain nicotine or tobacco. These products include cigarettes, chewing tobacco, and vaping devices, such as e-cigarettes. If you need help quitting, ask your health care provider. Monitor your blood pressure at home as told by your health care provider. Keep all follow-up visits. This is important. Medicines Take over-the-counter and prescription medicines only as told by your health care provider. Follow directions carefully. Blood  pressure medicines must be taken as prescribed. Do not skip doses of blood pressure medicine. Doing this puts you at risk for problems and can make the medicine less effective. Ask your health care provider about side effects or reactions to medicines that you should watch for. Contact a health care provider if you: Think you are having a reaction to a medicine you are taking. Have headaches that keep coming back (recurring). Feel dizzy. Have swelling in your ankles. Have trouble with your vision. Get help right away if you: Develop a severe headache or confusion. Have unusual weakness or numbness. Feel faint. Have severe pain in your chest or abdomen. Vomit repeatedly. Have trouble breathing. These symptoms may be an emergency. Get help right away. Call 911. Do not wait to see if the symptoms will go away. Do not drive yourself to the hospital. Summary Hypertension is when the force of blood pumping through your arteries is too strong. If this condition is not controlled, it may put you at risk for serious complications. Your personal target blood pressure may vary depending on your medical conditions, your age, and other factors. For most people, a normal blood pressure is less than 120/80. Hypertension is treated with lifestyle changes, medicines, or a combination of both. Lifestyle changes include losing weight, eating a healthy,  low-sodium diet, exercising more, and limiting alcohol. This information is not intended to replace advice given to you by your health care provider. Make sure you discuss any questions you have with your health care provider. Document Revised: 01/24/2021 Document Reviewed: 01/24/2021 [employer] Patient Education  2024 [employer].

## 2023-11-25 NOTE — Progress Notes (Signed)
 Subjective:    Patient ID: Lorrene GORMAN Molt, male    DOB: 04-Nov-1945, 78 y.o.   MRN: [id number]  Chief Complaint  Patient presents with   Medical Management of Chronic Issues    Patient presents today for a 2 month follow-up.   Quality Metric Gaps    AWV    HPI Discussed the use of AI scribe software for clinical note transcription with the patient, who gave verbal consent to proceed.  History of Present Illness TARQUIN WELCHER is an 78 year old male with Parkinson's disease who presents for follow-up regarding foot callus management and medication review.  He experiences a sensation in his foot described as 'like somebody in there trying to get out,' especially when walking barefoot. He has a history of a screw in his foot and callus formation, which he believes contributes to the sensation. The callus was previously shaved after it ruptured, and he has been monitoring it closely since then.  He has Parkinson's disease and is currently on carbidopa -levodopa , which he takes every three hours. Attempts to extend the interval to three and a half hours result in significant difficulty walking and increased tremors. He also uses Inbrija  twice daily, which initially was effective once daily. Increased symptoms occur in the afternoon and evening, necessitating a second dose of Inbrija  and another at bedtime.  He experiences constipation, which he manages with a diet including apples and prune juice. Bowel movements range from two to five times a week. He uses prune juice mixed with milk of magnesia when necessary.  He has a history of falls, which were a concern around the time of his foot issue, but he has not had any recent falls. He is starting physical therapy again to help maintain mobility. Denies CP/palp/SOB/HA/congestion/fevers/GI or GU c/o. 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 Required nasal packing a couple of days ago.  Had a similar episode of epistaxis a little over a year ago.  At that visit, I could not see the exact bleeding spot. EXAMINATION after packing removal reveals several excoriated areas anteriorly but I was unable to see t   Essential hypertension 12/25/2011   Eustachian tube dysfunction, left 02/21/2021   Flank pain 07/19/2022   History of blood transfusion 2010   After Kidney surgery   History of chicken pox    History of renal cell carcinoma 02/08/2015   diagnosed and removed in 2010 Right Monitored by Dr Tanda Moats of urology at [facility] Dr Claudine Alley, nephrology at [facility]   Hyperglycemia 12/23/2015   Increased thyroid  stimulating hormone (TSH) level 06/07/2017   Ingrown left big toenail 02/14/2015   Left foot pain 02/14/2015   MVP (mitral valve prolapse) 05/14/2016   Parkinson's disease (HCC) 12/23/2015   Posterior vitreous detachment of both eyes 08/21/2022   Primary open-angle glaucoma, left eye, severe stage 05/05/2021   Primary open-angle glaucoma, right eye, moderate  stage 01/10/2016   Pseudophakia, both eyes 08/21/2022   Right hip pain 12/12/2016   Stage 3 chronic kidney disease 12/25/2011   Thrombocytopenia 02/08/2015   Tremor 02/14/2015    Past Surgical History:  Procedure Laterality Date   APPENDECTOMY  1995   CATARACT EXTRACTION, BILATERAL     COLONOSCOPY     COLONOSCOPY WITH PROPOFOL  N/A 12/17/2018   Procedure: COLONOSCOPY WITH PROPOFOL ;  Surgeon:  Wilhelmenia Aloha Raddle., MD;  Location: [facility] ENDOSCOPY;  Service: Gastroenterology;  Laterality: N/A;   ENDOSCOPIC MUCOSAL RESECTION N/A 12/17/2018   Procedure: ENDOSCOPIC MUCOSAL RESECTION;  Surgeon: Wilhelmenia Aloha Raddle., MD;  Location: [facility] ENDOSCOPY;  Service: Gastroenterology;  Laterality: N/A;   HEMOSTASIS CLIP PLACEMENT  12/17/2018   Procedure: HEMOSTASIS CLIP PLACEMENT;  Surgeon: Wilhelmenia Aloha Raddle., MD;  Location: [facility] ENDOSCOPY;  Service: Gastroenterology;;   left knee scope  2003   POLYPECTOMY  12/17/2018   Procedure: POLYPECTOMY;  Surgeon: Wilhelmenia Aloha Raddle., MD;  Location: [facility] ENDOSCOPY;  Service: Gastroenterology;;   right knee scope  1999   SUBMUCOSAL LIFTING INJECTION  12/17/2018   Procedure: SUBMUCOSAL LIFTING INJECTION;  Surgeon: Wilhelmenia Aloha Raddle., MD;  Location: [facility] ENDOSCOPY;  Service: Gastroenterology;;   TOE SURGERY Left    metal 2nd toe- and top of foot- straighten bone   TONSILLECTOMY     TOTAL KNEE ARTHROPLASTY Left 07/06/2014   TOTAL NEPHRECTOMY Right     Family History  Problem Relation Age of Onset   Heart attack Mother    Stroke Mother        swelling in brain stem   Glaucoma Mother    Alcohol abuse Father    Hyperlipidemia Father    Hypertension Father    Diabetes Father    Heart disease Father    Healthy Daughter    Healthy Daughter    Healthy Son    Cancer Maternal Aunt        lung cancer   Cancer Paternal Uncle        bone cancer   Colon cancer Neg Hx    Esophageal cancer Neg Hx    Stomach cancer Neg Hx    Inflammatory bowel disease Neg Hx    Liver disease Neg Hx    Pancreatic cancer Neg Hx    Rectal cancer Neg Hx     Social History   Socioeconomic History   Marital status: Married    Spouse name: Not on file   Number of children: 3   Years of education: 16   Highest education level: Bachelor's degree (e.g., BA, AB, BS)  Occupational History   Occupation: Retired    CommentHydrologist in [employer]  Tobacco Use    Smoking status: Former    Types: Pipe    Quit date: 11/18/1995    Years since quitting: 28.0   Smokeless tobacco: Never   Tobacco comments:    stopped 20 years ago.  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, no major dietary restrictions   Other Topics Concern   Not on file  Social History Narrative   Right Handed   Lives in a one story home   Drinks one cup of coffee a day   Social Drivers of Health   Financial Resource Strain: Low Risk  (11/24/2023)  Overall Financial Resource Strain (CARDIA)    Difficulty of Paying Living Expenses: Not hard at all  Food Insecurity: No Food Insecurity (11/24/2023)   Hunger Vital Sign    Worried About Running Out of Food in the Last Year: Never true    Ran Out of Food in the Last Year: Never true  Transportation Needs: No Transportation Needs (11/24/2023)   PRAPARE - Administrator, Civil Service (Medical): No    Lack of Transportation (Non-Medical): No  Physical Activity: Inactive (11/24/2023)   Exercise Vital Sign    Days of Exercise per Week: 0 days    Minutes of Exercise per Session: Not on file  Stress: No Stress Concern Present (11/24/2023)   [employer] of Occupational Health - Occupational Stress Questionnaire    Feeling of Stress: Only a little  Social Connections: Moderately Isolated (11/24/2023)   Social Connection and Isolation Panel    Frequency of Communication with Friends and Family: Twice a week    Frequency of Social Gatherings with Friends and Family: Once a week    Attends Religious Services: Never    Database administrator or Organizations: No    Attends Engineer, structural: Not on file    Marital Status: Married  Catering manager Violence: Not At Risk (09/30/2023)   Humiliation, Afraid, Rape, and Kick questionnaire     Fear of Current or Ex-Partner: No    Emotionally Abused: No    Physically Abused: No    Sexually Abused: No    Outpatient Medications Prior to Visit  Medication Sig Dispense Refill   acetaminophen  (TYLENOL ) 500 MG tablet Take 1,000 mg by mouth every 6 (six) hours as needed (pain.).      atenolol  (TENORMIN ) 25 MG tablet Take 0.5 tablets (12.5 mg total) by mouth daily. 45 tablet 1   atorvastatin  (LIPITOR) 20 MG tablet Take 1 tablet (20 mg total) by mouth daily. 90 tablet 3   Brinzolamide -Brimonidine  (SIMBRINZA ) 1-0.2 % SUSP Place 1 drop into both eyes 3 (three) times daily. 8 mL 11   carbidopa -levodopa  (SINEMET  CR) 50-200 MG tablet Take 1 tablet by mouth at bedtime. 90 tablet 0   carbidopa -levodopa  (SINEMET  IR) 25-100 MG tablet Take 2 tablets by mouth at 8am, 2 tablets at 11am, 2 tablets at 2pm, and 1 tablet at 6pm. 630 tablet 0   clonazePAM  (KLONOPIN ) 0.5 MG tablet Take 1 tablet (0.5 mg total) by mouth at bedtime. 30 tablet 5   divalproex  (DEPAKOTE  ER) 250 MG 24 hr tablet Take 1 tablet (250 mg total) by mouth daily. 90 tablet 0   donepezil  (ARICEPT ) 10 MG tablet Take 1 tablet (10 mg total) by mouth at bedtime. 30 tablet 2   ketoconazole  (NIZORAL ) 2 % shampoo Use every other day, applying directly to damp scalp, lather, let sit 5-10 minutes and rinse. 120 mL 3   Levodopa  (INBRIJA ) 42 MG CAPS TWO capsules is ONE dosage (never inhale just one capsule). You can inhale the capsules as needed up to 5 times per day, separated by 2 hour intervals. 300 capsule 2   MAGNESIUM GLYCINATE PO Take 200 mg by mouth at bedtime.     mirabegron  ER (MYRBETRIQ ) 50 MG TB24 tablet Take 1 tablet (50 mg total) by mouth daily. 30 tablet 5   mirtazapine  (REMERON ) 15 MG tablet Take 1 tablet (15 mg total) by mouth at bedtime. 90 tablet 0   Multiple Vitamins-Minerals (PRESERVISION AREDS 2 PO) Take 1 tablet by mouth daily.  Polyethyl Glycol-Propyl Glycol (LUBRICANT EYE DROPS) 0.4-0.3 % SOLN Place 1 drop into both eyes  3 (three) times daily as needed (dry/irritated eyes.).     tamsulosin  (FLOMAX ) 0.4 MG CAPS capsule Take 1 capsule (0.4 mg total) by mouth daily. 30 capsule 0   No facility-administered medications prior to visit.    Allergies  Allergen Reactions   Nsaids    Sulfa Antibiotics Other (See Comments)    Had a reaction as a child   Tolmetin Other (See Comments)    Review of Systems  Constitutional:  Positive for malaise/fatigue. Negative for fever.  HENT:  Negative for congestion.   Eyes:  Negative for blurred vision.  Respiratory:  Negative for shortness of breath.   Cardiovascular:  Negative for chest pain, palpitations and leg swelling.  Gastrointestinal:  Negative for abdominal pain, blood in stool and nausea.  Genitourinary:  Negative for dysuria and frequency.  Musculoskeletal:  Positive for joint pain. Negative for falls.  Skin:  Negative for rash.  Neurological:  Positive for tremors and weakness. Negative for dizziness, loss of consciousness and headaches.  Endo/Heme/Allergies:  Negative for environmental allergies.  Psychiatric/Behavioral:  Negative for depression. The patient is not nervous/anxious.        Objective:    Physical Exam Vitals reviewed.  Constitutional:      Appearance: Normal appearance. He is not ill-appearing.  HENT:     Head: Normocephalic and atraumatic.     Nose: Nose normal.  Eyes:     Conjunctiva/sclera: Conjunctivae normal.  Cardiovascular:     Rate and Rhythm: Normal rate.     Pulses: Normal pulses.     Heart sounds: Normal heart sounds. No murmur heard. Pulmonary:     Effort: Pulmonary effort is normal.     Breath sounds: Normal breath sounds. No wheezing.  Abdominal:     Palpations: Abdomen is soft. There is no mass.     Tenderness: There is no abdominal tenderness.  Musculoskeletal:     Cervical back: Normal range of motion.     Right lower leg: No edema.     Left lower leg: No edema.  Skin:    General: Skin is warm and dry.      Comments: Palmar surface left foot. 1 cm diameter callous.  Base of fourth toe  Neurological:     General: No focal deficit present.     Mental Status: He is alert and oriented to person, place, and time.  Psychiatric:        Mood and Affect: Mood normal.     BP 126/78   Pulse 60   Resp 16   Ht 6' 3 (1.905 m)   Wt 206 lb 9.6 oz (93.7 kg)   SpO2 97%   BMI 25.82 kg/m  Wt Readings from Last 3 Encounters:  11/25/23 206 lb 9.6 oz (93.7 kg)  11/13/23 206 lb (93.4 kg)  07/08/23 207 lb 12.8 oz (94.3 kg)    Diabetic Foot Exam - Simple   No data filed    Lab Results  Component Value Date   WBC 10.0 09/17/2023   HGB 14.6 09/17/2023   HCT 44.5 09/17/2023   PLT 119.0 (L) 09/17/2023   GLUCOSE 104 (H) 09/17/2023   CHOL 94 07/09/2023   TRIG 100.0 07/09/2023   HDL 48.40 07/09/2023   LDLCALC 25 07/09/2023   ALT 12 09/17/2023   AST 25 09/17/2023   NA 138 09/17/2023   K 4.5 09/17/2023   CL 105 09/17/2023  CREATININE 1.39 09/17/2023   BUN 20 09/17/2023   CO2 27 09/17/2023   TSH 2.45 07/09/2023   INR 1.1 (H) 12/12/2018   HGBA1C 6.0 07/09/2023    Lab Results  Component Value Date   TSH 2.45 07/09/2023   Lab Results  Component Value Date   WBC 10.0 09/17/2023   HGB 14.6 09/17/2023   HCT 44.5 09/17/2023   MCV 94.2 09/17/2023   PLT 119.0 (L) 09/17/2023   Lab Results  Component Value Date   NA 138 09/17/2023   K 4.5 09/17/2023   CO2 27 09/17/2023   GLUCOSE 104 (H) 09/17/2023   BUN 20 09/17/2023   CREATININE 1.39 09/17/2023   BILITOT 0.7 09/17/2023   ALKPHOS 85 09/17/2023   AST 25 09/17/2023   ALT 12 09/17/2023   PROT 6.5 09/17/2023   ALBUMIN 4.3 09/17/2023   CALCIUM  9.6 09/17/2023   ANIONGAP 12 09/15/2023   EGFR 47 (L) 09/11/2023   GFR 47.93 (L) 09/17/2023   Lab Results  Component Value Date   CHOL 94 07/09/2023   Lab Results  Component Value Date   HDL 48.40 07/09/2023   Lab Results  Component Value Date   LDLCALC 25 07/09/2023   Lab Results   Component Value Date   TRIG 100.0 07/09/2023   Lab Results  Component Value Date   CHOLHDL 2 07/09/2023   Lab Results  Component Value Date   HGBA1C 6.0 07/09/2023       Assessment & Plan:  Dyslipidemia Assessment & Plan: Encouraged heart healthy diet, increase exercise, avoid trans fats, consider a krill oil cap daily   Essential hypertension Assessment & Plan: Well controlled, no changes to meds. Encouraged heart healthy diet such as the DASH diet and exercise as tolerated.     Hyperglycemia Assessment & Plan: hgba1c acceptable, minimize simple carbs. Increase exercise as tolerated.    Stage 3 chronic kidney disease, unspecified whether stage 3a or 3b CKD (HCC) Assessment & Plan: Hydrate and monitor   Thrombocytopenia Assessment & Plan: Mild, asymptomatic     Assessment and Plan Assessment & Plan Callus and scar tissue of left foot with residual discomfort Residual discomfort in the left foot due to callus and scar tissue. No signs of infection or exudate. The callus was previously shaved by an NP, and the foot currently looks good without the need for debridement. - Use a pumice stone once or twice a week after showering to manage the callus. - Consider referral to podiatry if the callus becomes problematic or if discomfort increases.  Mobility impairment due to left foot pain and Parkinson's disease Mobility impairment is primarily due to left foot pain and Parkinson's disease. Physical therapy is being initiated to improve mobility and prevent further decline. - Start physical therapy to improve mobility.  Parkinson's disease with motor fluctuations Parkinson's disease with motor fluctuations, including increased tremors and difficulty walking as medication wears off. Current regimen includes Inbrija  twice daily. Considering a carbidopa -levodopa  pump for more consistent medication delivery. Insurance approval for the pump is pending. The pump is expected to  provide more even medication delivery, potentially improving motor function and reducing fluctuations. - Continue current medication regimen, including Inbrija  twice daily. - Await insurance approval for carbidopa -levodopa  pump. - Consider using a carbidopa -levodopa  pump to provide more consistent medication delivery and reduce motor fluctuations.  Constipation Constipation is managed with dietary modifications, including increased apple intake. Bowel movement frequency varies from two to five times a week. Prune juice and milk of magnesia  are considered for more severe constipation. - Continue dietary modifications, including apple intake. - Consider using prune juice or prune juice mixed with milk of magnesia if constipation worsens.  Hyperglycemia, monitoring for diabetes Monitoring hyperglycemia due to concerns about potential diabetes, especially in the context of foot health. Last blood work was done in April, approximately four months ago. - Order blood work to monitor blood sugar levels.  Recording duration: 21 minutes     Harlene Horton, MD

## 2023-11-28 ENCOUNTER — Ambulatory Visit: Attending: Neurology | Admitting: Physical Therapy

## 2023-11-28 ENCOUNTER — Other Ambulatory Visit: Payer: Self-pay

## 2023-11-28 ENCOUNTER — Other Ambulatory Visit

## 2023-11-28 ENCOUNTER — Encounter: Payer: Self-pay | Admitting: Physical Therapy

## 2023-11-28 DIAGNOSIS — M6281 Muscle weakness (generalized): Secondary | ICD-10-CM | POA: Insufficient documentation

## 2023-11-28 DIAGNOSIS — R2681 Unsteadiness on feet: Secondary | ICD-10-CM | POA: Insufficient documentation

## 2023-11-28 DIAGNOSIS — G20A2 Parkinson's disease without dyskinesia, with fluctuations: Secondary | ICD-10-CM | POA: Diagnosis not present

## 2023-11-28 DIAGNOSIS — R2689 Other abnormalities of gait and mobility: Secondary | ICD-10-CM | POA: Diagnosis not present

## 2023-11-28 DIAGNOSIS — R296 Repeated falls: Secondary | ICD-10-CM | POA: Insufficient documentation

## 2023-11-29 ENCOUNTER — Ambulatory Visit: Payer: Self-pay | Admitting: Family Medicine

## 2023-11-29 LAB — LIPID PANEL
Cholesterol: 101 mg/dL (ref 0–200)
HDL: 58.9 mg/dL (ref >39.00)
LDL Cholesterol: 4 mg/dL (ref 0–99)
NonHDL: 42.03
Total CHOL/HDL Ratio: 2
Triglycerides: 189 mg/dL — ABNORMAL HIGH (ref 0.0–149.0)
VLDL: 37.8 mg/dL (ref 0.0–40.0)

## 2023-11-29 LAB — CBC WITH DIFFERENTIAL/PLATELET
Basophils Absolute: 0.1 K/uL (ref 0.0–0.1)
Basophils Relative: 1.1 % (ref 0.0–3.0)
Eosinophils Absolute: 0.1 K/uL (ref 0.0–0.7)
Eosinophils Relative: 1.6 % (ref 0.0–5.0)
HCT: 42 % (ref 39.0–52.0)
Hemoglobin: 13.7 g/dL (ref 13.0–17.0)
Lymphocytes Relative: 48.7 % — ABNORMAL HIGH (ref 12.0–46.0)
Lymphs Abs: 3.3 K/uL (ref 0.7–4.0)
MCHC: 32.5 g/dL (ref 30.0–36.0)
MCV: 94.2 fl (ref 78.0–100.0)
Monocytes Absolute: 0.5 K/uL (ref 0.1–1.0)
Monocytes Relative: 7.7 % (ref 3.0–12.0)
Neutro Abs: 2.8 K/uL (ref 1.4–7.7)
Neutrophils Relative %: 40.9 % — ABNORMAL LOW (ref 43.0–77.0)
Platelets: 121 K/uL — ABNORMAL LOW (ref 150.0–400.0)
RBC: 4.46 Mil/uL (ref 4.22–5.81)
RDW: 13.8 % (ref 11.5–15.5)
WBC: 6.7 K/uL (ref 4.0–10.5)

## 2023-11-29 LAB — COMPREHENSIVE METABOLIC PANEL WITH GFR
ALT: 12 U/L (ref 0–53)
AST: 23 U/L (ref 0–37)
Albumin: 4 g/dL (ref 3.5–5.2)
Alkaline Phosphatase: 98 U/L (ref 39–117)
BUN: 16 mg/dL (ref 6–23)
CO2: 28 meq/L (ref 19–32)
Calcium: 9 mg/dL (ref 8.4–10.5)
Chloride: 104 meq/L (ref 96–112)
Creatinine, Ser: 1.55 mg/dL — ABNORMAL HIGH (ref 0.40–1.50)
GFR: 42 mL/min — ABNORMAL LOW (ref >60.00)
Glucose, Bld: 130 mg/dL — ABNORMAL HIGH (ref 70–99)
Potassium: 4.5 meq/L (ref 3.5–5.1)
Sodium: 141 meq/L (ref 135–145)
Total Bilirubin: 0.5 mg/dL (ref 0.2–1.2)
Total Protein: 6 g/dL (ref 6.0–8.3)

## 2023-11-29 LAB — TSH: TSH: 2.15 u[IU]/mL (ref 0.35–5.50)

## 2023-11-29 LAB — HEMOGLOBIN A1C: Hgb A1c MFr Bld: 6 % (ref 4.6–6.5)

## 2023-12-02 ENCOUNTER — Other Ambulatory Visit: Payer: Self-pay | Admitting: Family Medicine

## 2023-12-02 ENCOUNTER — Other Ambulatory Visit: Payer: Self-pay | Admitting: Neurology

## 2023-12-02 DIAGNOSIS — G2581 Restless legs syndrome: Secondary | ICD-10-CM

## 2023-12-02 DIAGNOSIS — F02A11 Dementia in other diseases classified elsewhere, mild, with agitation: Secondary | ICD-10-CM

## 2023-12-02 DIAGNOSIS — G20A1 Parkinson's disease without dyskinesia, without mention of fluctuations: Secondary | ICD-10-CM

## 2023-12-03 ENCOUNTER — Other Ambulatory Visit (HOSPITAL_BASED_OUTPATIENT_CLINIC_OR_DEPARTMENT_OTHER): Payer: Self-pay

## 2023-12-03 ENCOUNTER — Other Ambulatory Visit: Payer: Self-pay

## 2023-12-03 MED ORDER — DONEPEZIL HCL 10 MG PO TABS
10.0000 mg | ORAL_TABLET | Freq: Every day | ORAL | 2 refills | Status: DC
  Filled 2023-12-03: qty 30, 30d supply, fill #0
  Filled 2023-12-30: qty 30, 30d supply, fill #1
  Filled 2024-01-29: qty 30, 30d supply, fill #2

## 2023-12-03 MED ORDER — MIRTAZAPINE 15 MG PO TABS
15.0000 mg | ORAL_TABLET | Freq: Every day | ORAL | 0 refills | Status: DC
  Filled 2023-12-03: qty 90, 90d supply, fill #0

## 2023-12-11 ENCOUNTER — Other Ambulatory Visit (HOSPITAL_BASED_OUTPATIENT_CLINIC_OR_DEPARTMENT_OTHER): Payer: Self-pay

## 2023-12-12 ENCOUNTER — Other Ambulatory Visit (HOSPITAL_BASED_OUTPATIENT_CLINIC_OR_DEPARTMENT_OTHER): Payer: Self-pay

## 2023-12-12 MED ORDER — TAMSULOSIN HCL 0.4 MG PO CAPS
0.4000 mg | ORAL_CAPSULE | Freq: Every day | ORAL | 0 refills | Status: DC
  Filled 2023-12-12: qty 30, 30d supply, fill #0

## 2023-12-13 ENCOUNTER — Ambulatory Visit: Attending: Neurology | Admitting: Physical Therapy

## 2023-12-13 ENCOUNTER — Encounter: Payer: Self-pay | Admitting: Physical Therapy

## 2023-12-13 DIAGNOSIS — R296 Repeated falls: Secondary | ICD-10-CM | POA: Insufficient documentation

## 2023-12-13 DIAGNOSIS — R2689 Other abnormalities of gait and mobility: Secondary | ICD-10-CM | POA: Diagnosis not present

## 2023-12-13 DIAGNOSIS — M6281 Muscle weakness (generalized): Secondary | ICD-10-CM | POA: Insufficient documentation

## 2023-12-13 DIAGNOSIS — G20A2 Parkinson's disease without dyskinesia, with fluctuations: Secondary | ICD-10-CM | POA: Insufficient documentation

## 2023-12-13 DIAGNOSIS — Z9181 History of falling: Secondary | ICD-10-CM | POA: Diagnosis not present

## 2023-12-13 DIAGNOSIS — R2681 Unsteadiness on feet: Secondary | ICD-10-CM | POA: Diagnosis not present

## 2023-12-13 NOTE — Therapy (Signed)
 OUTPATIENT PHYSICAL THERAPY PARKINSON'S EVALUATION   Patient Name: Vincent Walker MRN: [id number] DOB:10-18-1945, 78 y.o., male Today's Date: 12/13/2023   END OF SESSION:  PT End of Session - 12/13/23 1101     Visit Number 2    Date for PT Re-Evaluation 02/20/24    Authorization Type Medicare & Federal BCBS    PT Start Time 1100    PT Stop Time 1145    PT Time Calculation (min) 45 min    Activity Tolerance Patient tolerated treatment well    Behavior During Therapy [facility] for tasks assessed/performed;Flat affect          Past Medical History:  Diagnosis Date   Adenomatous polyp of ascending colon 10/09/2018   Allergies 07/07/2018   Arthritis    Astigmatism of both eyes with presbyopia 08/21/2022   Blepharitis of upper and lower eyelids of both eyes 01/10/2016   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 Required nasal packing a couple of days ago.  Had a similar episode of epistaxis a little over a year ago.  At that visit, I could not see the exact bleeding spot. EXAMINATION after packing removal reveals several excoriated areas anteriorly but I was unable to see t   Essential hypertension 12/25/2011   Eustachian tube dysfunction, left 02/21/2021   Flank pain 07/19/2022   History of blood transfusion 2010   After Kidney surgery   History of chicken pox    History of renal cell carcinoma 02/08/2015   diagnosed and removed in 2010 Right Monitored by Dr Tanda Moats of urology at [facility] Dr Claudine Alley, nephrology at [facility]   Hyperglycemia 12/23/2015   Increased thyroid  stimulating hormone (TSH) level 06/07/2017   Ingrown left big toenail 02/14/2015   Left foot pain 02/14/2015    MVP (mitral valve prolapse) 05/14/2016   Parkinson's disease (HCC) 12/23/2015   Posterior vitreous detachment of both eyes 08/21/2022   Primary open-angle glaucoma, left eye, severe stage 05/05/2021   Primary open-angle glaucoma, right eye, moderate stage 01/10/2016   Pseudophakia, both eyes 08/21/2022   Right hip pain 12/12/2016   Stage 3 chronic kidney disease 12/25/2011   Thrombocytopenia 02/08/2015   Tremor 02/14/2015   Past Surgical History:  Procedure Laterality Date   APPENDECTOMY  1995   CATARACT EXTRACTION, BILATERAL     COLONOSCOPY     COLONOSCOPY WITH PROPOFOL  N/A 12/17/2018   Procedure: COLONOSCOPY WITH PROPOFOL ;  Surgeon: Wilhelmenia Aloha Raddle., MD;  Location: [facility] ENDOSCOPY;  Service: Gastroenterology;  Laterality: N/A;   ENDOSCOPIC MUCOSAL RESECTION N/A 12/17/2018   Procedure: ENDOSCOPIC MUCOSAL RESECTION;  Surgeon: Wilhelmenia Aloha Raddle., MD;  Location: [facility] ENDOSCOPY;  Service: Gastroenterology;  Laterality: N/A;   HEMOSTASIS CLIP PLACEMENT  12/17/2018   Procedure: HEMOSTASIS CLIP PLACEMENT;  Surgeon: Wilhelmenia Aloha Raddle., MD;  Location: [facility] ENDOSCOPY;  Service: Gastroenterology;;   left knee scope  2003   POLYPECTOMY  12/17/2018   Procedure: POLYPECTOMY;  Surgeon: Wilhelmenia Aloha Raddle., MD;  Location: [facility] ENDOSCOPY;  Service: Gastroenterology;;   right knee scope  1999   SUBMUCOSAL LIFTING INJECTION  12/17/2018   Procedure: SUBMUCOSAL LIFTING INJECTION;  Surgeon: Wilhelmenia Aloha Raddle., MD;  Location: [facility] ENDOSCOPY;  Service: Gastroenterology;;   TOE SURGERY Left  metal 2nd toe- and top of foot- straighten bone   TONSILLECTOMY     TOTAL KNEE ARTHROPLASTY Left 07/06/2014   TOTAL NEPHRECTOMY Right    Patient Active Problem List   Diagnosis Date Noted   Cellulitis of left foot 09/17/2023   Dementia due to Parkinson's disease 11/20/2022   Dry eye syndrome of both eyes 08/28/2022   Astigmatism of both eyes with presbyopia 08/21/2022   Choroidal nevus of right  eye 08/21/2022   Posterior vitreous detachment of both eyes 08/21/2022   Pseudophakia, both eyes 08/21/2022   Primary open-angle glaucoma, left eye, severe stage 05/05/2021   Eustachian tube dysfunction, left 02/21/2021   Early dry stage nonexudative age-related macular degeneration of both eyes 03/09/2019   Adenomatous polyp of ascending colon 10/09/2018   Epistaxis 07/07/2018   Allergies 07/07/2018   Obesity 06/15/2018   Increased thyroid  stimulating hormone (TSH) level 06/07/2017   MVP (mitral valve prolapse) 05/14/2016   Blepharitis of upper and lower eyelids of both eyes 01/10/2016   Primary open-angle glaucoma, right eye, moderate stage 01/10/2016   Parkinson's disease (HCC) 12/23/2015   Hyperglycemia 12/23/2015   Tremor 02/14/2015   Dyslipidemia 02/08/2015   History of renal cell carcinoma 02/08/2015   Thrombocytopenia 02/08/2015   History of chicken pox    Essential hypertension 12/25/2011   Stage 3 chronic kidney disease 12/25/2011    PCP: Domenica Harlene LABOR, MD   REFERRING PROVIDER: Evonnie Asberry RAMAN, DO   REFERRING DIAG: G20.A2 (ICD-10-CM) - Parkinson's disease without dyskinesia, with fluctuating manifestations (HCC)   THERAPY DIAG:  Parkinson's disease without dyskinesia, with fluctuations (HCC)  Other abnormalities of gait and mobility  Unsteadiness on feet  Muscle weakness (generalized)  Repeated falls  History of falling  RATIONALE FOR EVALUATION AND TREATMENT: Rehabilitation  ONSET DATE: PD diagnosis 5+ years ago   NEXT MD VISIT: 04/14/24   SUBJECTIVE:                                                                                                                                                                                                         SUBJECTIVE STATEMENT: Patient reports back pain, no falls  Pt reports worsening of his Parkinson's disease. His biggest concern is that he is having a harder time walking and and to switch to a 2WW/RW from  his cane.  He states his wife also notes more forgetfulness/memory issues. He recognizes increased freezing of gait, typcially worse in the mornings and lessening as the day progresses.  He is worried about falling and injuring himself to the point  where he will need someone to take care of him.  Pt accompanied by: significant other - wife [facility]) in waiting room   PAIN: Are you having pain? No and Yes: NPRS scale: 0/10 currently, up to 7-8/10  Pain location: low back  Pain description: burning, locked up Aggravating factors: leaning more on RW, prolonged or repetitive bending forward  Relieving factors: sitting upright, lying on the floor or a firm mattress   Are you having pain? 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 Yes. Number of falls 2 (both while using RW to go around a corner)  LIVING ENVIRONMENT: Lives with: lives with their spouse Lives in: House/apartment Stairs: No Has following equipment at home: Single point cane, Environmental consultant - 2 wheeled, Grab bars, and Recumbent Bike  OCCUPATION: Retired  PLOF: Independent with household mobility with device, Independent with community mobility with device, Needs assistance with homemaking, and Leisure: mostly sedentary recently     PATIENT GOALS: To get myself going to help bring back some of what I've lost.   OBJECTIVE: (objective measures completed at initial  evaluation unless otherwise dated)  DIAGNOSTIC FINDINGS:  09/15/23 - CT CERVICAL SPINE WITHOUT CONTRAST (s/p fall, hit right-side) IMPRESSION: 1. No acute cervical spine fracture. 2. Extensive multilevel cervical degenerative changes.  09/15/23 - CT HEAD WITHOUT CONTRAST (s/p fall, hit right-side) IMPRESSION: 1. No acute intracranial process.  09/15/23 - RIGHT SHOULDER - 2+ VIEW (s/p fall, hit right-side) IMPRESSION: 1. No acute fracture. 2. Bone hypertrophy likely from remote trauma to the distal clavicle. Heterotopic bone formation likely from remote trauma to the coracoclavicular ligament. 3. Mild high-riding humeral head which may be secondary to a superior rotator cuff tear. 4. Mild glenohumeral osteoarthritis.  09/15/23 - LEFT FOOT - COMPLETE 3+ VIEW (L foot pain) IMPRESSION: 1. No significant change from prior.  No acute fracture. 2. Mild hallux valgus. 3. Postsurgical changes of second and third PIP arthrodesis.  09/29/23 - MR FOOT LEFT W WO CONTRAST FINDINGS: Postoperative change with metal artifact to the distal second, third, and fourth digits. No fracture or dislocation. No erosions. Mild second tarsometatarsal joint osteoarthritis with joint space narrowing and mild degenerative edema. The visualized marrow signal is otherwise unremarkable.   No obvious soft tissue wound/ulceration. This may be obscured by metal artifact. Moderate diffuse muscle atrophy. Mild likely reactive myositis. The tendons are unremarkable. Moderate to severe dorsal subcutaneous edema. No organized fluid collection.   IMPRESSION: The exam is slightly limited by metal artifact.   Moderate to severe dorsal subcutaneous edema. Correlate for lymphedema versus cellulitis.   No obvious soft tissue wound/ulceration or evidence of osteomyelitis. If continued clinical concern, CT scan and/or bone scan could be performed to further characterize. This would limit the metal artifact.   Diffuse muscle  atrophy.   Mild degenerative change.  COGNITION: Overall cognitive status: Impaired - decreased short-term memory and recall of new information    SENSATION: WFL  COORDINATION: B heel-toe mildly diminished.  Impaired heel to shin bilaterally.  MUSCLE TONE: Increased LE muscle tone noted during attempts at PROM/flexibility assessment.   POSTURE:  rounded shoulders, forward head, increased thoracic kyphosis, and flexed trunk   MUSCLE LENGTH: Hamstrings: mod tight B ITB: mild tight B Piriformis: mod/severe tight B Hip flexors: mod/severe tight B Quads: mild/mod tight B Heelcord: mild tight B  LOWER EXTREMITY ROM:    Mildly limited B hip and ankle ROM (R>L), mostly due to limited muscle flexibility as above.   LOWER EXTREMITY MMT:    MMT Right eval Left eval  Hip flexion 4+ 4+  Hip extension 4- 4  Hip abduction 4 4  Hip adduction 4+ 4+  Hip internal rotation 5 5  Hip external rotation 4+ 4+  Knee flexion 4+ 4+  Knee extension 4+ 4+  Ankle dorsiflexion 4+ 4+  Ankle plantarflexion    Ankle inversion    Ankle eversion    (Blank rows = not tested)  BED MOBILITY:  Minimal assist overall  TRANSFERS: Assistive device utilized: Environmental consultant - 2 wheeled  Sit to stand: Modified independence and SBA - cues to push up from chair rather than pull up on walker Stand to sit: Modified independence Chair to chair: NT Floor: NT  GAIT: Distance walked: Clinic distances Assistive device utilized: Environmental consultant - 2 wheeled Level of assistance: Modified independence Gait pattern: step through pattern, decreased stride length, shuffling, festinating, and trunk flexed Comments: Patient reports intermittent freezing episodes, most common in the morning and subsiding as the day progresses  FUNCTIONAL TESTS:  5 times sit to stand: 12/13/23 23 seconds back on heels Timed up and go (TUG): 12/13/23  no device 23 seconds lost balance with turn Manual =  Cognitive =  10 meter walk test: 23.25 sec  with 2WW Gait Speed: 1.41 ft/sec w/o AD, limited community ambulator; gait speed of <1.8 ft/sec indicates the risk for recurrent falls  Berg Balance Scale: 12/13/23 = 41/56 Functional gait assessment: TBA Interpretation of FGA scores: Non-Specific Older Adults Cutoff Score: <=22/30 = risk of falls Parkinson's Disease Cutoff score <15/30= fall risk (Hoehn & Yahr 1-4)  Minimally Clinically Important Difference (MCID)  Stroke (acute, subacute, and chronic) = MDC: 4.2 points Vestibular (acute) = MDC: 6 points Community Dwelling Older Adults =  MCID: 4 points Parkinson's Disease  =  MDC: 4.3 points  (Academy of Neurologic Physical Therapy (nd). Functional Gait Assessment. Retrieved from https://www.neuropt.org/docs/default-source/cpgs/core-outcome-measures/function-gait-assessment-pocket-guide-proof9-(2).pdf?sfvrsn=b51f35043_0.)  Baseline as of D/C from last PT episode (11/20/22): 11/15/22: 5xSTS = 13.88 sec w/o UE assist (occasional hands on knees) TUG:  Normal = 9.93 sec Manual = 10.19 sec Cognitive = 13.16 sec (unable to complete cognitive task) : w/o AD = 10.62 sec, 9.32 sec (fastest comfortable speed w/o AD) with SPC = 11.59 sec with B hiking poles = 11.62 sec Gait speed: 3.09 ft/sec w/o AD  3.52 ft/sec (fastest comfortable speed w/o AD) 2.83 ft/sec with SPC  2.82 ft/sec with B hiking poles    11/20/22: Berg = 50/56, 46-51 moderate risk for falls (>50%)  FGA = 21/30, 19-24 = medium risk for fall   PATIENT SURVEYS:  ABC scale: The Activities-Specific Balance Confidence (ABC) Scale 0% 10 20 30  40 50 60 70 80 90 100% No confidence<->completely confident  "How confident are you that you will not lose your balance or become unsteady when you . . .   Date tested 11/28/23 *confidence with RW  Walk around the house 80%  2. Walk up or down stairs 50%  3. [employer]  over and pick up a slipper from in front of a closet floor 70%  4. Reach for a small can off a shelf at eye level 100%   5. Stand on tip toes and reach for something above your head 60%  6. Stand on a chair and reach for something 0%  7. Sweep the floor 0%  8. Walk outside the house to a car parked in the driveway 80%  9. Get into or out of a car 90%  10. Walk across a parking lot to the mall 70%  11. Walk up or down a ramp 70%  12. Walk in a crowded mall where people rapidly walk past you 60%  13. Are bumped into by people as you walk through the mall 70%  14. Step onto or off of an escalator while you are holding onto the railing 80%  15. Step onto or off an escalator while holding onto parcels such that you cannot hold onto the railing 0%  16. Walk outside on icy sidewalks 0%  Total: #/16 880 / 1600 = 55.0 %  50-80% = moderate level of physical functioning <69% indicates risk for recurrent falls in PD   TODAY'S TREATMENT:  12/13/23 Nustep level 5 x 6 minutes TUG =  23 seconds, unsteady with turn no device but did touch wall 5XSTS =23 seconds  really back on heels Berg = 41/56 On airex standing On airex ball toss, had to have another person for CGA  tended to lose balance to the back Step over side to sided and forward back PVC T CGA Direction changes   11/19/2023  SELF CARE:  Reviewed eval findings and role of PT in addressing identified deficits as well as need for further assessment of balance.  Provided education on safe hand placement with RW during transfers to reduce fall risk.   PATIENT EDUCATION:  Education details: PT eval findings, anticipated POC, and need for further assessment of standardized balance tests  Person educated: Patient and Spouse Education method: Explanation Education comprehension: verbalized understanding  HOME EXERCISE PROGRAM: TBD  HEP from last PT episode:  Access Code: [email or web address] URL: [email or web address] Date: 11/06/2022 Prepared by: Elijah Hidden   Exercises - Supine Lower Trunk Rotation  - 2 x daily - 7 x weekly - 2 sets - 10 reps -  10 sec hold - Seated Thoracic Lumbar Extension with Pectoralis Stretch  - 2 x daily - 7 x weekly - 2 sets - 10 reps - 5 sec hold - Seated Hamstring Stretch  - 2 x daily - 7 x weekly - 3 reps - 30 sec hold - Seated Figure 4 Piriformis Stretch  - 2 x daily - 7 x weekly - 3 reps - 30 sec hold - Seated Piriformis Stretch  - 2 x daily - 7 x weekly - 3 reps - 30 sec hold - Seated Hip Flexor Stretch  - 2 x daily - 7 x weekly - 3 reps - 30 sec hold - Standing Gastroc Stretch at Counter  - 2 x daily - 7 x weekly - 3 reps - 30 sec hold - Seated Quadratus Lumborum Stretch in Chair  - 2 x daily - 7 x weekly - 3 reps - 30 sec hold - Seated Flexion Stretch with Swiss Ball  - 2 x daily - 7 x weekly - 3 reps - 30 sec hold - Seated Thoracic Flexion and Rotation with Swiss Ball  - 2 x daily - 7 x weekly - 3  reps - 30 sec hold   Patient Education - Tips to reduce freezing episodes with standing or walking   PWR! Moves: - Supine - Seated - Standing   ASSESSMENT:  CLINICAL IMPRESSION: 2nd visit, patient reports no falls, assessed Berg , TUG and 5XSTS, discussed safety and fall risk.  He really is on his heels with the sit to stands and with on airex, needed CGA with this.  He has a significant functional and safety change from the testing a year ago, he is now at a higher risk for falls and will benefit for skilled PT services   NATHANEL TALLMAN is a 78 y.o. male who was referred to physical therapy for evaluation and treatment for Parkinson's disease.  Patient first diagnosed with Parkinson's 5+ years ago.  He has completed 2 prior PT episodes in 2022/2023 and 2024 within the [facility] system.  Most recent PT episode for Parkinson's disease was 09/25/2022 - 11/20/2022.  Since his last PT episode, he reports worsening of his PD leading to increased gait impairments and need to switch from single-point cane to RW/2WW.  Patient presents with physical impairments of decreased timing and coordination of gait,  impaired ambulation, impaired standing balance, abnormal posture, bradykinesia with transfers, impaired activity tolerance, LE weakness, postural instability and decreased safety awareness impacting safe and independent functional mobility.  Examination revealed patient is at risk for falls and functional decline as evidenced by the following objective test measures: Gait speed 1.41 ft/sec, (2.62 ft/sec is needed for community access and <1.8 ft/sec is indicative of risk for recurrent falls), which demonstrates a significant decline from gait speed of.  He reports increased incidence of freezing of gait, most common in the morning and subsiding as the day progresses.  He has had 2 falls in the past 6 months, both of which occurred while turning with the RW.  Further balance testing with 5xSTS, TUG, Berg and/or DGI/FGA indicated however patient needing to use the bathroom preventing testing today.  ABC scale score of 55% indicates a moderate level of physical functioning, and score of <69% indicates risk for recurrent falls in PD.  Seddrick will benefit from skilled PT to address above deficits to improve mobility and activity tolerance to help reach the maximal level of functional independence with mobility and gait with reduced risk for falls.  Patient and his wife demonstrates understanding of this POC and are in agreement with this plan.   OBJECTIVE IMPAIRMENTS: Abnormal gait, decreased activity tolerance, decreased balance, decreased coordination, decreased knowledge of condition, decreased knowledge of use of DME, decreased mobility, difficulty walking, decreased ROM, decreased strength, decreased safety awareness, increased fascial restrictions, impaired perceived functional ability, impaired flexibility, improper body mechanics, postural dysfunction, and pain.   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   REHAB POTENTIAL: Good  CLINICAL DECISION MAKING: Unstable/unpredictable  EVALUATION COMPLEXITY: High   GOALS: Goals reviewed with patient? Yes  SHORT TERM GOALS: Target date: 01/09/2024  Patient will be independent with initial HEP. Baseline: TBD Goal status: INITIAL  2.  Patient will demonstrate decreased fall risk by scoring < 25 sec on TUG. Baseline: TBA Goal status: INITIAL  3.  Patient will be educated on strategies to decrease risk of falls.  Baseline:  Goal status: INITIAL  4.  Patient will verbalize tips to reduce freezing/festination with gait and turns. Baseline: Patient reports freezing episodes most common in the morning, subsiding as the day progresses. Goal status: INITIAL  LONG TERM GOALS: Target date: 02/20/2024  Patient will be independent with ongoing/advanced HEP for self-management at home incorporating PWR! Moves as indicated .  Baseline:  Goal status: INITIAL  2.  Patient will be able to ambulate 600' with LRAD with good safety to access community.  Baseline:  Goal status: INITIAL  3.  Patient will be able to step up/down curb safely with LRAD for safety with community ambulation.  Baseline:  Goal status: INITIAL   4.  Patient will demonstrate gait speed of >/= 1.8 ft/sec (0.55 m/s) to be a safe limited community ambulator with decreased risk for recurrent falls.  Baseline: 1.41 ft/sec with RW Goal status: INITIAL  5.  Patient will improve 5x STS time to </=  16 seconds to demonstrate improved functional strength and transfer efficiency. Baseline: TBA Goal status: INITIAL  6.  Patient will demonstrate at least 19/24 on DGI or 19/30 on FGA to improve gait stability and reduce risk for falls. Baseline: TBA Goal status: INITIAL  7.  Patient will improve Berg score by at least 8 points to improve safety and stability with ADLs in standing and reduce risk for falls.   Baseline: TBA Goal status: INITIAL  8.  Patient will report >/= 69% on ABC scale to demonstrate improved balance confidence and decreased risk for falls. Baseline: 880 / 1600 = 55.0 % Goal status: INITIAL  9. Patient will verbalize understanding of local Parkinson's disease community resources, including community fitness post d/c. Baseline:  Goal status: INITIAL   PLAN:  PT FREQUENCY: 2x/week  PT DURATION: 12 weeks  PLANNED INTERVENTIONS: [id number]- PT Re-evaluation, 97750- Physical Performance Testing, 97110-Therapeutic exercises, 97530- Therapeutic activity, [id number]- Neuromuscular re-education, 97535- Self Care, [id number]- Manual therapy, [phone]- Gait training, [phone]- Electrical stimulation (unattended), 97035- Ultrasound, 02966- Ionotophoresis 4mg /ml Dexamethasone , 79439 (1-2 muscles), 20561 (3+ muscles)- Dry Needling, Patient/Family education, Balance training, Stair training, Taping, Joint mobilization, Spinal mobilization, Cryotherapy, and Moist heat  PLAN FOR NEXT SESSION: review stretches from HEP from prior PD PT episode and update/modify as indicated; LE flexibility; core/postural strengthening   Tasmia Blumer W, PT 12/13/2023, 11:02 AM

## 2023-12-16 ENCOUNTER — Other Ambulatory Visit: Payer: Self-pay

## 2023-12-16 ENCOUNTER — Ambulatory Visit

## 2023-12-19 ENCOUNTER — Other Ambulatory Visit: Payer: Self-pay

## 2023-12-19 ENCOUNTER — Ambulatory Visit

## 2023-12-19 ENCOUNTER — Other Ambulatory Visit: Payer: Self-pay | Admitting: Neurology

## 2023-12-19 ENCOUNTER — Other Ambulatory Visit (HOSPITAL_BASED_OUTPATIENT_CLINIC_OR_DEPARTMENT_OTHER): Payer: Self-pay

## 2023-12-19 DIAGNOSIS — G20B1 Parkinson's disease with dyskinesia, without mention of fluctuations: Secondary | ICD-10-CM

## 2023-12-19 DIAGNOSIS — R296 Repeated falls: Secondary | ICD-10-CM

## 2023-12-19 DIAGNOSIS — R2681 Unsteadiness on feet: Secondary | ICD-10-CM

## 2023-12-19 DIAGNOSIS — Z9181 History of falling: Secondary | ICD-10-CM

## 2023-12-19 DIAGNOSIS — M6281 Muscle weakness (generalized): Secondary | ICD-10-CM | POA: Diagnosis not present

## 2023-12-19 DIAGNOSIS — R2689 Other abnormalities of gait and mobility: Secondary | ICD-10-CM | POA: Diagnosis not present

## 2023-12-19 DIAGNOSIS — G20A2 Parkinson's disease without dyskinesia, with fluctuations: Secondary | ICD-10-CM | POA: Diagnosis not present

## 2023-12-19 MED ORDER — CARBIDOPA-LEVODOPA 25-100 MG PO TABS
ORAL_TABLET | ORAL | 0 refills | Status: AC
Start: 2023-12-19 — End: ?
  Filled 2023-12-19: qty 630, 90d supply, fill #0

## 2023-12-19 NOTE — Therapy (Signed)
 OUTPATIENT PHYSICAL THERAPY PARKINSON'S TREATMENT   Patient Name: Vincent Walker MRN: [id number] DOB:05-30-1945, 78 y.o., male Today's Date: 12/19/2023   END OF SESSION:  PT End of Session - 12/19/23 1348     Visit Number 3    Date for Recertification  02/20/24    Authorization Type Medicare & Federal BCBS    PT Start Time 1315    PT Stop Time 1403    PT Time Calculation (min) 48 min    Activity Tolerance Patient tolerated treatment well    Behavior During Therapy [facility] for tasks assessed/performed;Flat affect           Past Medical History:  Diagnosis Date   Adenomatous polyp of ascending colon 10/09/2018   Allergies 07/07/2018   Arthritis    Astigmatism of both eyes with presbyopia 08/21/2022   Blepharitis of upper and lower eyelids of both eyes 01/10/2016   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 Required nasal packing a couple of days ago.  Had a similar episode of epistaxis a little over a year ago.  At that visit, I could not see the exact bleeding spot. EXAMINATION after packing removal reveals several excoriated areas anteriorly but I was unable to see t   Essential hypertension 12/25/2011   Eustachian tube dysfunction, left 02/21/2021   Flank pain 07/19/2022   History of blood transfusion 2010   After Kidney surgery   History of chicken pox    History of renal cell carcinoma 02/08/2015   diagnosed and removed in 2010 Right Monitored by Dr Tanda Moats of urology at [facility] Dr Claudine Alley, nephrology at [facility]   Hyperglycemia 12/23/2015   Increased thyroid  stimulating hormone (TSH) level 06/07/2017   Ingrown left big toenail 02/14/2015   Left foot pain 02/14/2015    MVP (mitral valve prolapse) 05/14/2016   Parkinson's disease (HCC) 12/23/2015   Posterior vitreous detachment of both eyes 08/21/2022   Primary open-angle glaucoma, left eye, severe stage 05/05/2021   Primary open-angle glaucoma, right eye, moderate stage 01/10/2016   Pseudophakia, both eyes 08/21/2022   Right hip pain 12/12/2016   Stage 3 chronic kidney disease 12/25/2011   Thrombocytopenia 02/08/2015   Tremor 02/14/2015   Past Surgical History:  Procedure Laterality Date   APPENDECTOMY  1995   CATARACT EXTRACTION, BILATERAL     COLONOSCOPY     COLONOSCOPY WITH PROPOFOL  N/A 12/17/2018   Procedure: COLONOSCOPY WITH PROPOFOL ;  Surgeon: Wilhelmenia Aloha Raddle., MD;  Location: [facility] ENDOSCOPY;  Service: Gastroenterology;  Laterality: N/A;   ENDOSCOPIC MUCOSAL RESECTION N/A 12/17/2018   Procedure: ENDOSCOPIC MUCOSAL RESECTION;  Surgeon: Wilhelmenia Aloha Raddle., MD;  Location: [facility] ENDOSCOPY;  Service: Gastroenterology;  Laterality: N/A;   HEMOSTASIS CLIP PLACEMENT  12/17/2018   Procedure: HEMOSTASIS CLIP PLACEMENT;  Surgeon: Wilhelmenia Aloha Raddle., MD;  Location: [facility] ENDOSCOPY;  Service: Gastroenterology;;   left knee scope  2003   POLYPECTOMY  12/17/2018   Procedure: POLYPECTOMY;  Surgeon: Wilhelmenia Aloha Raddle., MD;  Location: [facility] ENDOSCOPY;  Service: Gastroenterology;;   right knee scope  1999   SUBMUCOSAL LIFTING INJECTION  12/17/2018   Procedure: SUBMUCOSAL LIFTING INJECTION;  Surgeon: Wilhelmenia Aloha Raddle., MD;  Location: [facility] ENDOSCOPY;  Service: Gastroenterology;;   TOE SURGERY  Left    metal 2nd toe- and top of foot- straighten bone   TONSILLECTOMY     TOTAL KNEE ARTHROPLASTY Left 07/06/2014   TOTAL NEPHRECTOMY Right    Patient Active Problem List   Diagnosis Date Noted   Cellulitis of left foot 09/17/2023   Dementia due to Parkinson's disease 11/20/2022   Dry eye syndrome of both eyes 08/28/2022   Astigmatism of both eyes with presbyopia 08/21/2022   Choroidal nevus of right  eye 08/21/2022   Posterior vitreous detachment of both eyes 08/21/2022   Pseudophakia, both eyes 08/21/2022   Primary open-angle glaucoma, left eye, severe stage 05/05/2021   Eustachian tube dysfunction, left 02/21/2021   Early dry stage nonexudative age-related macular degeneration of both eyes 03/09/2019   Adenomatous polyp of ascending colon 10/09/2018   Epistaxis 07/07/2018   Allergies 07/07/2018   Obesity 06/15/2018   Increased thyroid  stimulating hormone (TSH) level 06/07/2017   MVP (mitral valve prolapse) 05/14/2016   Blepharitis of upper and lower eyelids of both eyes 01/10/2016   Primary open-angle glaucoma, right eye, moderate stage 01/10/2016   Parkinson's disease (HCC) 12/23/2015   Hyperglycemia 12/23/2015   Tremor 02/14/2015   Dyslipidemia 02/08/2015   History of renal cell carcinoma 02/08/2015   Thrombocytopenia 02/08/2015   History of chicken pox    Essential hypertension 12/25/2011   Stage 3 chronic kidney disease 12/25/2011    PCP: Domenica Harlene LABOR, MD   REFERRING PROVIDER: Evonnie Asberry RAMAN, DO   REFERRING DIAG: G20.A2 (ICD-10-CM) - Parkinson's disease without dyskinesia, with fluctuating manifestations (HCC)   THERAPY DIAG:  Parkinson's disease without dyskinesia, with fluctuations (HCC)  Other abnormalities of gait and mobility  Unsteadiness on feet  Muscle weakness (generalized)  Repeated falls  History of falling  RATIONALE FOR EVALUATION AND TREATMENT: Rehabilitation  ONSET DATE: PD diagnosis 5+ years ago   NEXT MD VISIT: 04/14/24   SUBJECTIVE:                                                                                                                                                                                                         SUBJECTIVE STATEMENT: Patient reports he feels pretty good, no pain.    Pt reports worsening of his Parkinson's disease. His biggest concern is that he is having a harder time walking and and to switch to a  2WW/RW from his cane.  He states his wife also notes more forgetfulness/memory issues. He recognizes increased freezing of gait, typcially worse in the mornings and lessening as the day progresses.  He is worried  about falling and injuring himself to the point where he will need someone to take care of him.  Pt accompanied by: significant other - wife [facility]) in waiting room   PAIN: Are you having pain? No and Yes: NPRS scale: 0/10 currently, up to 7-8/10  Pain location: low back  Pain description: burning, locked up Aggravating factors: leaning more on RW, prolonged or repetitive bending forward  Relieving factors: sitting upright, lying on the floor or a firm mattress   Are you having pain? 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 Yes. Number of falls 2 (both while using RW to go around a corner)  LIVING ENVIRONMENT: Lives with: lives with their spouse Lives in: House/apartment Stairs: No Has following equipment at home: Single point cane, Environmental consultant - 2 wheeled, Grab bars, and Recumbent Bike  OCCUPATION: Retired  PLOF: Independent with household mobility with device, Independent with community mobility with device, Needs assistance with homemaking, and Leisure: mostly sedentary recently     PATIENT GOALS: To get myself going to help bring back some of what I've lost.   OBJECTIVE: (objective measures completed at  initial evaluation unless otherwise dated)  DIAGNOSTIC FINDINGS:  09/15/23 - CT CERVICAL SPINE WITHOUT CONTRAST (s/p fall, hit right-side) IMPRESSION: 1. No acute cervical spine fracture. 2. Extensive multilevel cervical degenerative changes.  09/15/23 - CT HEAD WITHOUT CONTRAST (s/p fall, hit right-side) IMPRESSION: 1. No acute intracranial process.  09/15/23 - RIGHT SHOULDER - 2+ VIEW (s/p fall, hit right-side) IMPRESSION: 1. No acute fracture. 2. Bone hypertrophy likely from remote trauma to the distal clavicle. Heterotopic bone formation likely from remote trauma to the coracoclavicular ligament. 3. Mild high-riding humeral head which may be secondary to a superior rotator cuff tear. 4. Mild glenohumeral osteoarthritis.  09/15/23 - LEFT FOOT - COMPLETE 3+ VIEW (L foot pain) IMPRESSION: 1. No significant change from prior.  No acute fracture. 2. Mild hallux valgus. 3. Postsurgical changes of second and third PIP arthrodesis.  09/29/23 - MR FOOT LEFT W WO CONTRAST FINDINGS: Postoperative change with metal artifact to the distal second, third, and fourth digits. No fracture or dislocation. No erosions. Mild second tarsometatarsal joint osteoarthritis with joint space narrowing and mild degenerative edema. The visualized marrow signal is otherwise unremarkable.   No obvious soft tissue wound/ulceration. This may be obscured by metal artifact. Moderate diffuse muscle atrophy. Mild likely reactive myositis. The tendons are unremarkable. Moderate to severe dorsal subcutaneous edema. No organized fluid collection.   IMPRESSION: The exam is slightly limited by metal artifact.   Moderate to severe dorsal subcutaneous edema. Correlate for lymphedema versus cellulitis.   No obvious soft tissue wound/ulceration or evidence of osteomyelitis. If continued clinical concern, CT scan and/or bone scan could be performed to further characterize. This would limit the metal artifact.    Diffuse muscle atrophy.   Mild degenerative change.  COGNITION: Overall cognitive status: Impaired - decreased short-term memory and recall of new information  SENSATION: WFL  COORDINATION: B heel-toe mildly diminished. Impaired heel to shin bilaterally.  MUSCLE TONE: Increased LE muscle tone noted during attempts at PROM/flexibility assessment.   POSTURE:  rounded shoulders, forward head, increased thoracic kyphosis, and flexed trunk   MUSCLE LENGTH: Hamstrings: mod tight B ITB: mild tight B Piriformis: mod/severe tight B Hip flexors: mod/severe tight B Quads: mild/mod tight B Heelcord: mild tight B  LOWER EXTREMITY ROM:    Mildly limited B hip and ankle ROM (R>L), mostly due to limited muscle flexibility as above.   LOWER EXTREMITY MMT:    MMT Right eval Left eval  Hip flexion 4+ 4+  Hip extension 4- 4  Hip abduction 4 4  Hip adduction 4+ 4+  Hip internal rotation 5 5  Hip external rotation 4+ 4+  Knee flexion 4+ 4+  Knee extension 4+ 4+  Ankle dorsiflexion 4+ 4+  Ankle plantarflexion    Ankle inversion    Ankle eversion    (Blank rows = not tested)  BED MOBILITY:  Minimal assist overall  TRANSFERS: Assistive device utilized: Environmental consultant - 2 wheeled  Sit to stand: Modified independence and SBA - cues to push up from chair rather than pull up on walker Stand to sit: Modified independence Chair to chair: NT Floor: NT  GAIT: Distance walked: Clinic distances Assistive device utilized: Environmental consultant - 2 wheeled Level of assistance: Modified independence Gait pattern: step through pattern, decreased stride length, shuffling, festinating, and trunk flexed Comments: Patient reports intermittent freezing episodes, most common in the morning and subsiding as the day progresses  FUNCTIONAL TESTS:  5 times sit to stand: 12/13/23 23 seconds back on heels Timed up and go (TUG): 12/13/23  no device 23 seconds lost balance with turn Manual =  Cognitive =  10 meter walk  test: 23.25 sec with 2WW Gait Speed: 1.41 ft/sec w/o AD, limited community ambulator; gait speed of <1.8 ft/sec indicates the risk for recurrent falls  Berg Balance Scale: 12/13/23 = 41/56 Functional gait assessment: TBA Interpretation of FGA scores: Non-Specific Older Adults Cutoff Score: <=22/30 = risk of falls Parkinson's Disease Cutoff score <15/30= fall risk (Hoehn & Yahr 1-4)  Minimally Clinically Important Difference (MCID)  Stroke (acute, subacute, and chronic) = MDC: 4.2 points Vestibular (acute) = MDC: 6 points Community Dwelling Older Adults =  MCID: 4 points Parkinson's Disease  =  MDC: 4.3 points  (Academy of Neu[employer]ic Physical Therapy (nd). Functional Gait Assessment. Retrieved from https://www.neuropt.org/docs/default-source/cpgs/core-outcome-measures/function-gait-assessment-pocket-guide-proof9-(2).pdf?sfvrsn=b17f35043_0.)  Baseline as of D/C from last PT episode (11/20/22): 11/15/22: 5xSTS = 13.88 sec w/o UE assist (occasional hands on knees) TUG:  Normal = 9.93 sec Manual = 10.19 sec Cognitive = 13.16 sec (unable to complete cognitive task) : w/o AD = 10.62 sec, 9.32 sec (fastest comfortable speed w/o AD) with SPC = 11.59 sec with B hiking poles = 11.62 sec Gait speed: 3.09 ft/sec w/o AD  3.52 ft/sec (fastest comfortable speed w/o AD) 2.83 ft/sec with SPC  2.82 ft/sec with B hiking poles    11/20/22: Berg = 50/56, 46-51 moderate risk for falls (>50%)  FGA = 21/30, 19-24 = medium risk for fall   PATIENT SURVEYS:  ABC scale: The Activities-Specific Balance Confidence (ABC) Scale 0% 10 20 30  40 50 60 70 80 90 100% No confidence<->completely confident  "How confident are you that you will not lose your balance or become unsteady when you . . .   Date tested 11/28/23 *confidence with RW  Walk around the house 80%  2. Walk  up or down stairs 50%  3. Bend over and pick up a slipper from in front of a closet floor 70%  4. Reach for a small can off a shelf at  eye level 100%  5. Stand on tip toes and reach for something above your head 60%  6. Stand on a chair and reach for something 0%  7. Sweep the floor 0%  8. Walk outside the house to a car parked in the driveway 80%  9. Get into or out of a car 90%  10. Walk across a parking lot to the mall 70%  11. Walk up or down a ramp 70%  12. Walk in a crowded mall where people rapidly walk past you 60%  13. Are bumped into by people as you walk through the mall 70%  14. Step onto or off of an escalator while you are holding onto the railing 80%  15. Step onto or off an escalator while holding onto parcels such that you cannot hold onto the railing 0%  16. Walk outside on icy sidewalks 0%  Total: #/16 880 / 1600 = 55.0 %  50-80% = moderate level of physical functioning <69% indicates risk for recurrent falls in PD   TODAY'S TREATMENT:  12/19/23 Nustep L5x41min UE/LE Seated Lumbar flexion stretch green pball x 30' 3 way Seated hamstring stretch x 30' BLE Seated figure 4 stretch x 30 BLE Seated side bend stretch x 30' B Standing runner stretch x 30' Seated thoracic ext HBH 5x10 Standing pallof press GTB doubled x 10  Church pew weight shifts x 10  12/13/23 Nustep level 5 x 6 minutes TUG =  23 seconds, unsteady with turn no device but did touch wall 5XSTS =23 seconds  really back on heels Berg = 41/56 On airex standing On airex ball toss, had to have another person for CGA  tended to lose balance to the back Step over side to sided and forward back PVC T CGA Direction changes   11/19/2023  SELF CARE:  Reviewed eval findings and role of PT in addressing identified deficits as well as need for further assessment of balance.  Provided education on safe hand placement with RW during transfers to reduce fall risk.   PATIENT EDUCATION:  Education details: updated stretching HEP  Person educated: Patient and Spouse Education method: Explanation Education comprehension: verbalized  understanding  HOME EXERCISE PROGRAM: Access Code: [email or web address] URL: [email or web address] Date: 12/19/2023 Prepared by: Candus Braud  Exercises - Supine Lower Trunk Rotation  - 2 x daily - 7 x weekly - 2 sets - 10 reps - 10 sec hold - Seated Thoracic Lumbar Extension with Pectoralis Stretch  - 2 x daily - 7 x weekly - 2 sets - 10 reps - 5 sec hold - Seated Hamstring Stretch  - 2 x daily - 7 x weekly - 3 reps - 30 sec hold - Seated Figure 4 Piriformis Stretch  - 2 x daily - 7 x weekly - 3 reps - 30 sec hold - Standing Gastroc Stretch at Counter  - 2 x daily - 7 x weekly - 3 reps - 30 sec hold - Seated Quadratus Lumborum Stretch in Chair  - 2 x daily - 7 x weekly - 3 reps - 30 sec hold - Seated Flexion Stretch with Swiss Ball  - 2 x daily - 7 x weekly - 3 reps - 30 sec hold - Seated Thoracic Flexion and Rotation with [employer]  - 2 x daily -  7 x weekly - 3 reps - 30 sec hold  Patient Education - Tips to reduce freezing episodes with standing or walking   ASSESSMENT:  CLINICAL IMPRESSION: Pt responded well to treatment. Showed good demonstration of the stretches today. I did take some out from the previous program because he had some difficulties. Will continue to progress with core strength and balance activities in future visits.    Vincent Walker is a 78 y.o. male who was referred to physical therapy for evaluation and treatment for Parkinson's disease.  Patient first diagnosed with Parkinson's 5+ years ago.  He has completed 2 prior PT episodes in 2022/2023 and 2024 within the [facility] system.  Most recent PT episode for Parkinson's disease was 09/25/2022 - 11/20/2022.  Since his last PT episode, he reports worsening of his PD leading to increased gait impairments and need to switch from single-point cane to RW/2WW.  Patient presents with physical impairments of decreased timing and coordination of gait, impaired ambulation, impaired standing balance, abnormal posture,  bradykinesia with transfers, impaired activity tolerance, LE weakness, postural instability and decreased safety awareness impacting safe and independent functional mobility.  Examination revealed patient is at risk for falls and functional decline as evidenced by the following objective test measures: Gait speed 1.41 ft/sec, (2.62 ft/sec is needed for community access and <1.8 ft/sec is indicative of risk for recurrent falls), which demonstrates a significant decline from gait speed of.  He reports increased incidence of freezing of gait, most common in the morning and subsiding as the day progresses.  He has had 2 falls in the past 6 months, both of which occurred while turning with the RW.  Further balance testing with 5xSTS, TUG, Berg and/or DGI/FGA indicated however patient needing to use the bathroom preventing testing today.  ABC scale score of 55% indicates a moderate level of physical functioning, and score of <69% indicates risk for recurrent falls in PD.  Vincent Walker will benefit from skilled PT to address above deficits to improve mobility and activity tolerance to help reach the maximal level of functional independence with mobility and gait with reduced risk for falls.  Patient and his wife demonstrates understanding of this POC and are in agreement with this plan.   OBJECTIVE IMPAIRMENTS: Abnormal gait, decreased activity tolerance, decreased balance, decreased coordination, decreased knowledge of condition, decreased knowledge of use of DME, decreased mobility, difficulty walking, decreased ROM, decreased strength, decreased safety awareness, increased fascial restrictions, impaired perceived functional ability, impaired flexibility, improper body mechanics, postural dysfunction, and pain.   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   REHAB POTENTIAL: Good  CLINICAL DECISION MAKING: Unstable/unpredictable  EVALUATION COMPLEXITY: High   GOALS: Goals reviewed with patient? Yes  SHORT TERM GOALS: Target date: 01/09/2024  Patient will be independent with initial HEP. Baseline: TBD Goal status: INITIAL  2.  Patient will demonstrate decreased fall risk by scoring < 25 sec on TUG. Baseline: TBA Goal status: INITIAL  3.  Patient will be educated on strategies to decrease risk of falls.  Baseline:  Goal status: INITIAL  4.  Patient will verbalize tips to reduce freezing/festination with gait and turns. Baseline: Patient reports freezing episodes most common in the morning, subsiding as the day progresses. Goal status: INITIAL  LONG TERM GOALS: Target date: 02/20/2024  Patient will be independent with ongoing/advanced HEP for self-management at home incorporating PWR! Moves as indicated .  Baseline:  Goal status: INITIAL  2.  Patient will be able to ambulate 600' with LRAD with good safety to access community.  Baseline:  Goal status: INITIAL  3.  Patient will be able to step up/down curb safely with LRAD for safety with community ambulation.  Baseline:  Goal status: INITIAL   4.  Patient will demonstrate gait speed of >/= 1.8 ft/sec (0.55 m/s) to be a safe limited community ambulator with decreased risk for recurrent falls.  Baseline: 1.41 ft/sec with RW Goal status: INITIAL  5.  Patient will improve 5x STS time to </= 16 seconds to demonstrate improved functional strength and transfer  efficiency. Baseline: TBA Goal status: INITIAL  6.  Patient will demonstrate at least 19/24 on DGI or 19/30 on FGA to improve gait stability and reduce risk for falls. Baseline: TBA Goal status: INITIAL  7.  Patient will improve Berg score by at least 8 points to improve safety and stability with ADLs in standing and reduce risk for falls.   Baseline: TBA Goal status: INITIAL  8.  Patient will report >/= 69% on ABC scale to demonstrate improved balance confidence and decreased risk for falls. Baseline: 880 / 1600 = 55.0 % Goal status: INITIAL  9. Patient will verbalize understanding of local Parkinson's disease community resources, including community fitness post d/c. Baseline:  Goal status: INITIAL   PLAN:  PT FREQUENCY: 2x/week  PT DURATION: 12 weeks  PLANNED INTERVENTIONS: 97164- PT Re-evaluation, 97750- Physical Performance Testing, 97110-Therapeutic exercises, 97530- Therapeutic activity, 97112- Neuromuscular re-education, 97535- Self Care, [id number]- Manual therapy, [phone]- Gait training, [phone]- Electrical stimulation (unattended), 97035- Ultrasound, [id number]- Ionotophoresis 4mg /ml Dexamethasone , 79439 (1-2 muscles), 20561 (3+ muscles)- Dry Needling, Patient/Family education, Balance training, Stair training, Taping, Joint mobilization, Spinal mobilization, Cryotherapy, and Moist heat  PLAN FOR NEXT SESSION: LE flexibility; core/postural strengthening   Sol LITTIE Gaskins, PTA 12/19/2023, 2:53 PM

## 2023-12-23 ENCOUNTER — Ambulatory Visit: Admitting: Physical Therapy

## 2023-12-23 ENCOUNTER — Encounter: Payer: Self-pay | Admitting: Physical Therapy

## 2023-12-23 DIAGNOSIS — G20A2 Parkinson's disease without dyskinesia, with fluctuations: Secondary | ICD-10-CM | POA: Diagnosis not present

## 2023-12-23 DIAGNOSIS — R2681 Unsteadiness on feet: Secondary | ICD-10-CM | POA: Diagnosis not present

## 2023-12-23 DIAGNOSIS — R2689 Other abnormalities of gait and mobility: Secondary | ICD-10-CM

## 2023-12-23 DIAGNOSIS — Z9181 History of falling: Secondary | ICD-10-CM | POA: Diagnosis not present

## 2023-12-23 DIAGNOSIS — M6281 Muscle weakness (generalized): Secondary | ICD-10-CM

## 2023-12-23 DIAGNOSIS — R296 Repeated falls: Secondary | ICD-10-CM

## 2023-12-23 NOTE — Therapy (Signed)
 OUTPATIENT PHYSICAL THERAPY PARKINSON'S TREATMENT   Patient Name: Vincent Walker MRN: [id number] DOB:Aug 05, 1945, 78 y.o., male Today's Date: 12/23/2023   END OF SESSION:  PT End of Session - 12/23/23 1314       Visit Number 3     Date for PT Re-Evaluation 02/20/24     Authorization Type Medicare & Federal BCBS     PT Start Time 1314    PT Stop Time 1402    PT Time Calculation (min) 48 min     Activity Tolerance Patient tolerated treatment well     Behavior During Therapy [facility] for tasks assessed/performed;Flat affect       Past Medical History:  Diagnosis Date   Adenomatous polyp of ascending colon 10/09/2018   Allergies 07/07/2018   Arthritis    Astigmatism of both eyes with presbyopia 08/21/2022   Blepharitis of upper and lower eyelids of both eyes 01/10/2016   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 Required nasal packing a couple of days ago.  Had a similar episode of epistaxis a little over a year ago.  At that visit, I could not see the exact bleeding spot. EXAMINATION after packing removal reveals several excoriated areas anteriorly but I was unable to see t   Essential hypertension 12/25/2011   Eustachian tube dysfunction, left 02/21/2021   Flank pain 07/19/2022   History of blood transfusion 2010   After Kidney surgery   History of chicken pox    History of renal cell carcinoma 02/08/2015   diagnosed and removed in 2010 Right Monitored by Dr Tanda Moats of urology at [facility] Dr Claudine Alley, nephrology at [facility]   Hyperglycemia 12/23/2015   Increased thyroid  stimulating hormone (TSH) level 06/07/2017   Ingrown left big toenail 02/14/2015   Left foot pain 02/14/2015    MVP (mitral valve prolapse) 05/14/2016   Parkinson's disease (HCC) 12/23/2015   Posterior vitreous detachment of both eyes 08/21/2022   Primary open-angle glaucoma, left eye, severe stage 05/05/2021   Primary open-angle glaucoma, right eye, moderate stage 01/10/2016   Pseudophakia, both eyes 08/21/2022   Right hip pain 12/12/2016   Stage 3 chronic kidney disease 12/25/2011   Thrombocytopenia 02/08/2015   Tremor 02/14/2015   Past Surgical History:  Procedure Laterality Date   APPENDECTOMY  1995   CATARACT EXTRACTION, BILATERAL     COLONOSCOPY     COLONOSCOPY WITH PROPOFOL  N/A 12/17/2018   Procedure: COLONOSCOPY WITH PROPOFOL ;  Surgeon: Wilhelmenia Aloha Raddle., MD;  Location: [facility] ENDOSCOPY;  Service: Gastroenterology;  Laterality: N/A;   ENDOSCOPIC MUCOSAL RESECTION N/A 12/17/2018   Procedure: ENDOSCOPIC MUCOSAL RESECTION;  Surgeon: Wilhelmenia Aloha Raddle., MD;  Location: [facility] ENDOSCOPY;  Service: Gastroenterology;  Laterality: N/A;   HEMOSTASIS CLIP PLACEMENT  12/17/2018   Procedure: HEMOSTASIS CLIP PLACEMENT;  Surgeon: Wilhelmenia Aloha Raddle., MD;  Location: [facility] ENDOSCOPY;  Service: Gastroenterology;;   left knee scope  2003   POLYPECTOMY  12/17/2018   Procedure: POLYPECTOMY;  Surgeon: Wilhelmenia Aloha Raddle., MD;  Location: [facility] ENDOSCOPY;  Service: Gastroenterology;;   right knee scope  1999   SUBMUCOSAL LIFTING INJECTION  12/17/2018   Procedure: SUBMUCOSAL LIFTING INJECTION;  Surgeon: Wilhelmenia Aloha Raddle., MD;  Location: [facility] ENDOSCOPY;  Service: Gastroenterology;;  TOE SURGERY Left    metal 2nd toe- and top of foot- straighten bone   TONSILLECTOMY     TOTAL KNEE ARTHROPLASTY Left 07/06/2014   TOTAL NEPHRECTOMY Right    Patient Active Problem List   Diagnosis Date Noted   Cellulitis of left foot 09/17/2023   Dementia due to Parkinson's disease 11/20/2022   Dry eye syndrome of both eyes 08/28/2022   Astigmatism of both eyes with presbyopia 08/21/2022   Choroidal nevus of  right eye 08/21/2022   Posterior vitreous detachment of both eyes 08/21/2022   Pseudophakia, both eyes 08/21/2022   Primary open-angle glaucoma, left eye, severe stage 05/05/2021   Eustachian tube dysfunction, left 02/21/2021   Early dry stage nonexudative age-related macular degeneration of both eyes 03/09/2019   Adenomatous polyp of ascending colon 10/09/2018   Epistaxis 07/07/2018   Allergies 07/07/2018   Obesity 06/15/2018   Increased thyroid  stimulating hormone (TSH) level 06/07/2017   MVP (mitral valve prolapse) 05/14/2016   Blepharitis of upper and lower eyelids of both eyes 01/10/2016   Primary open-angle glaucoma, right eye, moderate stage 01/10/2016   Parkinson's disease (HCC) 12/23/2015   Hyperglycemia 12/23/2015   Tremor 02/14/2015   Dyslipidemia 02/08/2015   History of renal cell carcinoma 02/08/2015   Thrombocytopenia 02/08/2015   History of chicken pox    Essential hypertension 12/25/2011   Stage 3 chronic kidney disease 12/25/2011    PCP: Domenica Harlene LABOR, MD   REFERRING PROVIDER: Evonnie Asberry RAMAN, DO   REFERRING DIAG: G20.A2 (ICD-10-CM) - Parkinson's disease without dyskinesia, with fluctuating manifestations (HCC)   THERAPY DIAG:  Parkinson's disease without dyskinesia, with fluctuations (HCC)  Other abnormalities of gait and mobility  Unsteadiness on feet  Muscle weakness (generalized)  Repeated falls  RATIONALE FOR EVALUATION AND TREATMENT: Rehabilitation  ONSET DATE: PD diagnosis 5+ years ago   NEXT MD VISIT: 04/14/24   SUBJECTIVE:                                                                                                                                                                                                         SUBJECTIVE STATEMENT: Patient reports walking around his back doesn't hurt, but pain worsens when he has to bend over.  He notes his B knees were sore this morning after doing his HEP exercises over the weekend, but pain now  resolved.  Pt reports worsening of his Parkinson's disease. His biggest concern is that he is having a harder time walking and and to switch to a 2WW/RW from his cane.  He states his  wife also notes more forgetfulness/memory issues. He recognizes increased freezing of gait, typcially worse in the mornings and lessening as the day progresses.  He is worried about falling and injuring himself to the point where he will need someone to take care of him.  Pt accompanied by: significant other - wife [facility]) in waiting room   PAIN: Are you having pain? Yes: NPRS scale: 7-8/10  Pain location: low back  Pain description: burning, locked up Aggravating factors: leaning more on RW, prolonged or repetitive bending forward  Relieving factors: sitting upright, lying on the floor or a firm mattress   Are you having pain? 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 Yes. Number of falls 2 (both while using RW to go around a corner)  LIVING ENVIRONMENT: Lives with: lives with their spouse Lives in: House/apartment Stairs: No Has following equipment at home: Single point cane, Environmental consultant - 2 wheeled, Grab bars, and Recumbent Bike  OCCUPATION: Retired  PLOF: Independent with household mobility with device, Independent with community mobility with device, Needs assistance with homemaking, and Leisure: mostly sedentary recently     PATIENT GOALS: To get myself going to  help bring back some of what I've lost.   OBJECTIVE: (objective measures completed at initial evaluation unless otherwise dated)  DIAGNOSTIC FINDINGS:  09/15/23 - CT CERVICAL SPINE WITHOUT CONTRAST (s/p fall, hit right-side) IMPRESSION: 1. No acute cervical spine fracture. 2. Extensive multilevel cervical degenerative changes.  09/15/23 - CT HEAD WITHOUT CONTRAST (s/p fall, hit right-side) IMPRESSION: 1. No acute intracranial process.  09/15/23 - RIGHT SHOULDER - 2+ VIEW (s/p fall, hit right-side) IMPRESSION: 1. No acute fracture. 2. Bone hypertrophy likely from remote trauma to the distal clavicle. Heterotopic bone formation likely from remote trauma to the coracoclavicular ligament. 3. Mild high-riding humeral head which may be secondary to a superior rotator cuff tear. 4. Mild glenohumeral osteoarthritis.  09/15/23 - LEFT FOOT - COMPLETE 3+ VIEW (L foot pain) IMPRESSION: 1. No significant change from prior.  No acute fracture. 2. Mild hallux valgus. 3. Postsurgical changes of second and third PIP arthrodesis.  09/29/23 - MR FOOT LEFT W WO CONTRAST FINDINGS: Postoperative change with metal artifact to the distal second, third, and fourth digits. No fracture or dislocation. No erosions. Mild second tarsometatarsal joint osteoarthritis with joint space narrowing and mild degenerative edema. The visualized marrow signal is otherwise unremarkable.   No obvious soft tissue wound/ulceration. This may be obscured by metal artifact. Moderate diffuse muscle atrophy. Mild likely reactive myositis. The tendons are unremarkable. Moderate to severe dorsal subcutaneous edema. No organized fluid collection.   IMPRESSION: The exam is slightly limited by metal artifact.   Moderate to severe dorsal subcutaneous edema. Correlate for lymphedema versus cellulitis.   No obvious soft tissue wound/ulceration or evidence of osteomyelitis. If continued clinical concern, CT scan and/or bone scan  could be performed to further characterize. This would limit the metal artifact.   Diffuse muscle atrophy.   Mild degenerative change.  COGNITION: Overall cognitive status: Impaired - decreased short-term memory and recall of new information    SENSATION: WFL  COORDINATION: B heel-toe mildly diminished. Impaired heel to shin bilaterally.  MUSCLE TONE: Increased LE muscle tone noted during attempts at PROM/flexibility assessment.   POSTURE:  rounded shoulders, forward head, increased thoracic kyphosis, and flexed trunk   MUSCLE LENGTH: Hamstrings: mod tight B ITB: mild tight B Piriformis: mod/severe tight B Hip flexors: mod/severe tight B Quads: mild/mod tight B Heelcord: mild tight B  LOWER EXTREMITY ROM:    Mildly limited B hip and ankle ROM (R>L), mostly due to limited muscle flexibility as above.   LOWER EXTREMITY MMT:    MMT Right eval Left eval  Hip flexion 4+ 4+  Hip extension 4- 4  Hip abduction 4 4  Hip adduction 4+ 4+  Hip internal rotation 5 5  Hip external rotation 4+ 4+  Knee flexion 4+ 4+  Knee extension 4+ 4+  Ankle dorsiflexion 4+ 4+  Ankle plantarflexion    Ankle inversion    Ankle eversion    (Blank rows = not tested)  BED MOBILITY:  Minimal assist overall  TRANSFERS: Assistive device utilized: Environmental consultant - 2 wheeled  Sit to stand: Modified independence and SBA - cues to push up from chair rather than pull up on walker Stand to sit: Modified independence Chair to chair: NT Floor: NT  GAIT: Distance walked: Clinic distances Assistive device utilized: Environmental consultant - 2 wheeled Level of assistance: Modified independence Gait pattern: step through pattern, decreased stride length, shuffling, festinating, and trunk flexed Comments: Patient reports intermittent freezing episodes, most common in the morning and subsiding as the day progresses  FUNCTIONAL TESTS:  5 times sit to stand: 12/13/23 23 seconds back on heels Timed up and go (TUG): 12/13/23   no device 23 seconds lost balance with turn Manual =  Cognitive =  10 meter walk test: 23.25 sec with 2WW Gait Speed: 1.41 ft/sec w/o AD, limited community ambulator; gait speed of <1.8 ft/sec indicates the risk for recurrent falls  Berg Balance Scale: 12/13/23 = 41/56 Functional gait assessment: TBA Interpretation of FGA scores: Non-Specific Older Adults Cutoff Score: <=22/30 = risk of falls Parkinson's Disease Cutoff score <15/30= fall risk (Hoehn & Yahr 1-4)  Minimally Clinically Important Difference (MCID)  Stroke (acute, subacute, and chronic) = MDC: 4.2 points Vestibular (acute) = MDC: 6 points Community Dwelling Older Adults =  MCID: 4 points Parkinson's Disease  =  MDC: 4.3 points  (Academy of Neurologic Physical Therapy (nd). Functional Gait Assessment. Retrieved from https://www.neuropt.org/docs/default-source/cpgs/core-outcome-measures/function-gait-assessment-pocket-guide-proof9-(2).pdf?sfvrsn=b39f35043_0.)  Baseline as of D/C from last PT episode (11/20/22): 11/15/22: 5xSTS = 13.88 sec w/o UE assist (occasional hands on knees) TUG:  Normal = 9.93 sec Manual = 10.19 sec Cognitive = 13.16 sec (unable to complete cognitive task) : w/o AD = 10.62 sec, 9.32 sec (fastest comfortable speed w/o AD) with SPC = 11.59 sec with B hiking poles = 11.62 sec Gait speed: 3.09 ft/sec w/o AD  3.52 ft/sec (fastest comfortable speed w/o AD) 2.83 ft/sec with SPC  2.82 ft/sec with B hiking poles    11/20/22: Berg = 50/56, 46-51 moderate risk for falls (>50%)  FGA = 21/30, 19-24 = medium risk for fall   PATIENT SURVEYS:  ABC scale: The Activities-Specific Balance Confidence (ABC) Scale 0% 10 20 30  40 50 60 70 80 90 100% No confidence<->completely confident  "How confident are you that you will not lose your balance or become unsteady when you . SABRA SABRA  Date tested 11/28/23 *confidence with RW  Walk around the house 80%  2. Walk up or down stairs 50%  3. Bend over and pick up a  slipper from in front of a closet floor 70%  4. Reach for a small can off a shelf at eye level 100%  5. Stand on tip toes and reach for something above your head 60%  6. Stand on a chair and reach for something 0%  7. Sweep the floor 0%  8. Walk outside the house to a car parked in the driveway 80%  9. Get into or out of a car 90%  10. Walk across a parking lot to the mall 70%  11. Walk up or down a ramp 70%  12. Walk in a crowded mall where people rapidly walk past you 60%  13. Are bumped into by people as you walk through the mall 70%  14. Step onto or off of an escalator while you are holding onto the railing 80%  15. Step onto or off an escalator while holding onto parcels such that you cannot hold onto the railing 0%  16. Walk outside on icy sidewalks 0%  Total: #/16 880 / 1600 = 55.0 %  50-80% = moderate level of physical functioning <69% indicates risk for recurrent falls in PD   TODAY'S TREATMENT:   12/23/2023  THERAPEUTIC EXERCISE: To improve strength and endurance.  Demonstration, verbal and tactile cues throughout for technique.  NuStep - L6 x 6 min Seated GTB B scap retraction + shoulder row x 10  Seated GTB B scap retraction + shoulder extension x 10  Seated bent over scap retraction + B shoulder extension x 10 with 5# db bilaterally Seated GTB hip ABD/ER clam 2 x 10 Seated GTB hip flexion march x 10  NEUROMUSCULAR RE-EDUCATION: To improve balance, coordination, kinesthesia, posture, proprioception, reduce fall risk, amplitude of movement, speed of movement to reduce bradykinesia, and reduce rigidity. Seated PWR! Moves: Up x 10 Rock x 10 Twist x 10 Step x 10 Standing hip extension with looped GTB at ankles x 10, UE support on RW (encouraged counter support at home) Standing hip abduction with looped GTB at ankles x 10, UE support on RW  THERAPEUTIC ACTIVITIES: To improve functional performance.  Demonstration, verbal and tactile cues throughout for technique. Sit  to stand from Airex pad in chair w/o UE assist + GTB hip ABD isometric at distal thighs x 10   12/19/23 Nustep L5x58min UE/LE Seated Lumbar flexion stretch green pball x 30' 3 way Seated hamstring stretch x 30' BLE Seated figure 4 stretch x 30 BLE Seated side bend stretch x 30' B Standing runner stretch x 30' Seated thoracic ext HBH 5x10 Standing pallof press GTB doubled x 10  Church pew weight shifts x 10   12/13/23 Nustep level 5 x 6 minutes TUG =  23 seconds, unsteady with turn no device but did touch wall 5XSTS = 23 seconds  really back on heels Berg = 41/56 On airex standing On airex ball toss, had to have another person for CGA tended to lose balance to the back Step over side to sided and forward back PVC T CGA Direction changes   11/19/2023  SELF CARE:  Reviewed eval findings and role of PT in addressing identified deficits as well as need for further assessment of balance.  Provided education on safe hand placement with RW during transfers to reduce fall risk.   PATIENT EDUCATION:  Education details: HEP update -  postural and hip strengthening  Person educated: Patient Education method: Explanation, Demonstration, Verbal cues, and Handouts Education comprehension: verbalized understanding, returned demonstration, verbal cues required, and needs further education  HOME EXERCISE PROGRAM: Access Code: [email or web address] URL: [email or web address] Date: 12/23/2023 Prepared by: Vincent Walker Hidden  Exercises - Supine Lower Trunk Rotation  - 2 x daily - 7 x weekly - 2 sets - 10 reps - 10 sec hold - Seated Thoracic Lumbar Extension with Pectoralis Stretch  - 2 x daily - 7 x weekly - 2 sets - 10 reps - 5 sec hold - Seated Hamstring Stretch  - 2 x daily - 7 x weekly - 3 reps - 30 sec hold - Seated Figure 4 Piriformis Stretch  - 2 x daily - 7 x weekly - 3 reps - 30 sec hold - Standing Gastroc Stretch at Counter  - 2 x daily - 7 x weekly - 3 reps - 30 sec hold - Seated  Quadratus Lumborum Stretch in Chair  - 2 x daily - 7 x weekly - 3 reps - 30 sec hold - Seated Flexion Stretch with Swiss Ball  - 2 x daily - 7 x weekly - 3 reps - 30 sec hold - Seated Thoracic Flexion and Rotation with Swiss Ball  - 2 x daily - 7 x weekly - 3 reps - 30 sec hold - Seated Bent Over Shoulder Row with Dumbbells  - 1 x daily - 3 x weekly - 2 sets - 10 reps - 3 sec hold - Seated Shoulder Extension with Dumbbells  - 1 x daily - 3 x weekly - 2 sets - 10 reps - 3 sec hold - Standing Hip Extension with Resistance at Ankles and Counter Support  - 1 x daily - 3 x weekly - 2 sets - 10 reps - 3 sec hold - Standing Hip Abduction with Resistance at Ankles and Counter Support  - 1 x daily - 3 x weekly - 2 sets - 10 reps - 3 sec hold  Patient Education - Tips to reduce freezing episodes with standing or walking   ASSESSMENT:  CLINICAL IMPRESSION: Vincent Walker reports HEP stretches are going well but states he spreads them out during the week, not doing all stretches on the same day.  He denies need for review of HEP today, therefore reintroduced PWR! Moves in sitting and introduced postural and proximal LE strengthening, updating HEP with some of the strengthening exercises.  Encouraged seated scapular strengthening at this stage due to balance concerns as well as solid (counter) support during standing hip exercises.  Perrin will benefit from continued skilled PT to address ongoing postural, strength and balance  deficits to improve mobility and activity tolerance with decreased pain interference and decreased risk for falls.   EVAL: Vincent Walker is a 79 y.o. male who was referred to physical therapy for evaluation and treatment for Parkinson's disease.  Patient first diagnosed with Parkinson's 5+ years ago.  He has completed 2 prior PT episodes in 2022/2023 and 2024 within the [facility] system.  Most recent PT episode for Parkinson's disease was 09/25/2022 - 11/20/2022.  Since his last PT episode,  he reports worsening of his PD leading to increased gait impairments and need to switch from single-point cane to RW/2WW.  Patient presents with physical impairments of decreased timing and coordination of gait, impaired ambulation, impaired standing balance, abnormal posture, bradykinesia with transfers, impaired activity tolerance, LE weakness, postural instability and decreased safety awareness impacting safe and independent  functional mobility.  Examination revealed patient is at risk for falls and functional decline as evidenced by the following objective test measures: Gait speed 1.41 ft/sec, (2.62 ft/sec is needed for community access and <1.8 ft/sec is indicative of risk for recurrent falls), which demonstrates a significant decline from gait speed of.  He reports increased incidence of freezing of gait, most common in the morning and subsiding as the day progresses.  He has had 2 falls in the past 6 months, both of which occurred while turning with the RW.  Further balance testing with 5xSTS, TUG, Berg and/or DGI/FGA indicated however patient needing to use the bathroom preventing testing today.  ABC scale score of 55% indicates a moderate level of physical functioning, and score of <69% indicates risk for recurrent falls in PD.  Haywood will benefit from skilled PT to address above deficits to improve mobility and activity tolerance to help reach the maximal level of functional independence with mobility and gait with reduced risk for falls.  Patient and his wife demonstrates understanding of this POC and are in agreement with this plan.   OBJECTIVE IMPAIRMENTS: Abnormal gait, decreased activity tolerance, decreased balance, decreased coordination, decreased knowledge of condition, decreased knowledge of use of DME, decreased mobility, difficulty walking, decreased ROM, decreased strength, decreased safety awareness, increased fascial restrictions, impaired perceived functional ability, impaired  flexibility, improper body mechanics, postural dysfunction, and pain.   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   REHAB POTENTIAL: Good  CLINICAL DECISION MAKING: Unstable/unpredictable  EVALUATION COMPLEXITY: High   GOALS: Goals reviewed with patient? Yes  SHORT TERM GOALS: Target date: 01/09/2024  Patient will be independent with initial HEP. Baseline: TBD Goal status: IN PROGRESS - 12/23/23 - Pt reports no concerns with current HEP exercises  2.  Patient will demonstrate decreased fall risk by scoring < 25 sec on TUG. Baseline: TBA Goal status: MET - 12/13/23 - 23 seconds  3.  Patient will be educated on strategies to decrease risk of falls.  Baseline:  Goal status: INITIAL  4.  Patient will verbalize tips to reduce freezing/festination with gait and turns. Baseline: Patient reports freezing episodes most common in the morning, subsiding as the day progresses. Goal status: INITIAL  LONG TERM GOALS: Target date: 02/20/2024  Patient will be independent with ongoing/advanced HEP for self-management at home incorporating PWR! Moves as indicated .  Baseline:  Goal status: INITIAL  2.  Patient will be able to ambulate 600' with LRAD with good safety to access community.  Baseline:  Goal status: INITIAL  3.  Patient will be able to step up/down curb safely with LRAD for safety with community  ambulation.  Baseline:  Goal status: INITIAL   4.  Patient will demonstrate gait speed of >/= 1.8 ft/sec (0.55 m/s) to be a safe limited community ambulator with decreased risk for recurrent falls.  Baseline: 1.41 ft/sec with RW Goal status: INITIAL  5.  Patient will improve 5x STS time to </= 16 seconds to demonstrate improved functional strength and transfer efficiency. Baseline: TBA Goal status:  INITIAL  6.  Patient will demonstrate at least 19/24 on DGI or 19/30 on FGA to improve gait stability and reduce risk for falls. Baseline: TBA Goal status: INITIAL  7.  Patient will improve Berg score by at least 8 points to improve safety and stability with ADLs in standing and reduce risk for falls.   Baseline: TBA Goal status: INITIAL  8.  Patient will report >/= 69% on ABC scale to demonstrate improved balance confidence and decreased risk for falls. Baseline: 880 / 1600 = 55.0 % Goal status: INITIAL  9. Patient will verbalize understanding of local Parkinson's disease community resources, including community fitness post d/c. Baseline:  Goal status: INITIAL   PLAN:  PT FREQUENCY: 2x/week  PT DURATION: 12 weeks  PLANNED INTERVENTIONS: 97164- PT Re-evaluation, 97750- Physical Performance Testing, 97110-Therapeutic exercises, 97530- Therapeutic activity, 97112- Neuromuscular re-education, 97535- Self Care, 02859- Manual therapy, [phone]- Gait training, [phone]- Electrical stimulation (unattended), 97035- Ultrasound, [id number]- Ionotophoresis 4mg /ml Dexamethasone , 79439 (1-2 muscles), 20561 (3+ muscles)- Dry Needling, Patient/Family education, Balance training, Stair training, Taping, Joint mobilization, Spinal mobilization, Cryotherapy, and Moist heat  PLAN FOR NEXT SESSION: LE flexibility; core/postural strengthening   Vincent Walker CHRISTELLA Hidden, PT 12/23/2023, 1:14 PM

## 2023-12-25 ENCOUNTER — Encounter: Payer: Self-pay | Admitting: Neurology

## 2023-12-26 ENCOUNTER — Encounter: Payer: Self-pay | Admitting: Physical Therapy

## 2023-12-26 ENCOUNTER — Ambulatory Visit: Admitting: Physical Therapy

## 2023-12-26 DIAGNOSIS — Z9181 History of falling: Secondary | ICD-10-CM | POA: Diagnosis not present

## 2023-12-26 DIAGNOSIS — R2681 Unsteadiness on feet: Secondary | ICD-10-CM

## 2023-12-26 DIAGNOSIS — R2689 Other abnormalities of gait and mobility: Secondary | ICD-10-CM

## 2023-12-26 DIAGNOSIS — R296 Repeated falls: Secondary | ICD-10-CM | POA: Diagnosis not present

## 2023-12-26 DIAGNOSIS — M6281 Muscle weakness (generalized): Secondary | ICD-10-CM | POA: Diagnosis not present

## 2023-12-26 DIAGNOSIS — G20A2 Parkinson's disease without dyskinesia, with fluctuations: Secondary | ICD-10-CM

## 2023-12-26 NOTE — Therapy (Signed)
 OUTPATIENT PHYSICAL THERAPY PARKINSON'S TREATMENT   Patient Name: Vincent Walker MRN: [id number] DOB:[birth year], 78 y.o., male Today's Date: 12/26/2023   END OF SESSION:  PT End of Session - 12/26/23 1405     Visit Number 5    Date for Recertification  02/20/24    Authorization Type Medicare & Federal BCBS    PT Start Time 1405    PT Stop Time 1448    PT Time Calculation (min) 43 min    Activity Tolerance Patient tolerated treatment well    Behavior During Therapy [facility] for tasks assessed/performed;Flat affect           Past Medical History:  Diagnosis Date   Adenomatous polyp of ascending colon 10/09/2018   Allergies 07/07/2018   Arthritis    Astigmatism of both eyes with presbyopia 08/21/2022   Blepharitis of upper and lower eyelids of both eyes 01/10/2016   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 Required nasal packing a couple of days ago.  Had a similar episode of epistaxis a little over a year ago.  At that visit, I could not see the exact bleeding spot. EXAMINATION after packing removal reveals several excoriated areas anteriorly but I was unable to see t   Essential hypertension 12/25/2011   Eustachian tube dysfunction, left 02/21/2021   Flank pain 07/19/2022   History of blood transfusion 2010   After Kidney surgery   History of chicken pox    History of renal cell carcinoma 02/08/2015   diagnosed and removed in 2010 Right Monitored by Dr Tanda Moats of urology at [facility] Dr Claudine Alley, nephrology at [facility]   Hyperglycemia 12/23/2015   Increased thyroid  stimulating hormone (TSH) level 06/07/2017   Ingrown left big toenail 02/14/2015   Left foot pain 02/14/2015    MVP (mitral valve prolapse) 05/14/2016   Parkinson's disease (HCC) 12/23/2015   Posterior vitreous detachment of both eyes 08/21/2022   Primary open-angle glaucoma, left eye, severe stage 05/05/2021   Primary open-angle glaucoma, right eye, moderate stage 01/10/2016   Pseudophakia, both eyes 08/21/2022   Right hip pain 12/12/2016   Stage 3 chronic kidney disease 12/25/2011   Thrombocytopenia 02/08/2015   Tremor 02/14/2015   Past Surgical History:  Procedure Laterality Date   APPENDECTOMY  1995   CATARACT EXTRACTION, BILATERAL     COLONOSCOPY     COLONOSCOPY WITH PROPOFOL  N/A 12/17/2018   Procedure: COLONOSCOPY WITH PROPOFOL ;  Surgeon: Wilhelmenia Aloha Raddle., MD;  Location: [facility] ENDOSCOPY;  Service: Gastroenterology;  Laterality: N/A;   ENDOSCOPIC MUCOSAL RESECTION N/A 12/17/2018   Procedure: ENDOSCOPIC MUCOSAL RESECTION;  Surgeon: Wilhelmenia Aloha Raddle., MD;  Location: [facility] ENDOSCOPY;  Service: Gastroenterology;  Laterality: N/A;   HEMOSTASIS CLIP PLACEMENT  12/17/2018   Procedure: HEMOSTASIS CLIP PLACEMENT;  Surgeon: Wilhelmenia Aloha Raddle., MD;  Location: [facility] ENDOSCOPY;  Service: Gastroenterology;;   left knee scope  2003   POLYPECTOMY  12/17/2018   Procedure: POLYPECTOMY;  Surgeon: Wilhelmenia Aloha Raddle., MD;  Location: [facility] ENDOSCOPY;  Service: Gastroenterology;;   right knee scope  1999   SUBMUCOSAL LIFTING INJECTION  12/17/2018   Procedure: SUBMUCOSAL LIFTING INJECTION;  Surgeon: Wilhelmenia Aloha Raddle., MD;  Location: [facility] ENDOSCOPY;  Service: Gastroenterology;;   TOE SURGERY  Left    metal 2nd toe- and top of foot- straighten bone   TONSILLECTOMY     TOTAL KNEE ARTHROPLASTY Left 07/06/2014   TOTAL NEPHRECTOMY Right    Patient Active Problem List   Diagnosis Date Noted   Cellulitis of left foot 09/17/2023   Dementia due to Parkinson's disease 11/20/2022   Dry eye syndrome of both eyes 08/28/2022   Astigmatism of both eyes with presbyopia 08/21/2022   Choroidal nevus of right  eye 08/21/2022   Posterior vitreous detachment of both eyes 08/21/2022   Pseudophakia, both eyes 08/21/2022   Primary open-angle glaucoma, left eye, severe stage 05/05/2021   Eustachian tube dysfunction, left 02/21/2021   Early dry stage nonexudative age-related macular degeneration of both eyes 03/09/2019   Adenomatous polyp of ascending colon 10/09/2018   Epistaxis 07/07/2018   Allergies 07/07/2018   Obesity 06/15/2018   Increased thyroid  stimulating hormone (TSH) level 06/07/2017   MVP (mitral valve prolapse) 05/14/2016   Blepharitis of upper and lower eyelids of both eyes 01/10/2016   Primary open-angle glaucoma, right eye, moderate stage 01/10/2016   Parkinson's disease (HCC) 12/23/2015   Hyperglycemia 12/23/2015   Tremor 02/14/2015   Dyslipidemia 02/08/2015   History of renal cell carcinoma 02/08/2015   Thrombocytopenia 02/08/2015   History of chicken pox    Essential hypertension 12/25/2011   Stage 3 chronic kidney disease 12/25/2011    PCP: Domenica Harlene LABOR, MD   REFERRING PROVIDER: Evonnie Asberry RAMAN, DO   REFERRING DIAG: G20.A2 (ICD-10-CM) - Parkinson's disease without dyskinesia, with fluctuating manifestations (HCC)   THERAPY DIAG:  Parkinson's disease without dyskinesia, with fluctuations (HCC)  Other abnormalities of gait and mobility  Unsteadiness on feet  Muscle weakness (generalized)  Repeated falls  RATIONALE FOR EVALUATION AND TREATMENT: Rehabilitation  ONSET DATE: PD diagnosis 5+ years ago   NEXT MD VISIT: 04/14/24   SUBJECTIVE:                                                                                                                                                                                                         SUBJECTIVE STATEMENT: Patient reports increased muscle soreness from his hips to his knees since last PT session.   EVAL:  Pt reports worsening of his Parkinson's disease. His biggest concern is that he is having a harder time  walking and and to switch to a 2WW/RW from his cane.  He states his wife also notes more forgetfulness/memory issues. He recognizes increased freezing of gait, typcially worse in the mornings and lessening as the day progresses.  He is worried about falling and injuring himself to the point where he will need someone to take care of him.  Pt accompanied by: significant other - wife [facility]) in waiting room   PAIN: Are you having pain? Yes: NPRS scale: 8/10  Pain location: B hips to knees   Pain description: muscle soreness Aggravating factors: leaning more on RW, prolonged or repetitive bending forward  Relieving factors: sitting upright, lying on the floor or a firm mattress   Are you having pain? 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 Yes. Number of falls 2 (both while using RW to go around a corner)  LIVING ENVIRONMENT: Lives with: lives with their spouse Lives in: House/apartment Stairs: No Has following equipment at home: Single point cane, Environmental consultant - 2 wheeled, Grab bars, and Recumbent Bike  OCCUPATION: Retired  PLOF: Independent with household mobility with device, Independent with community mobility with device, Needs assistance with homemaking, and Leisure: mostly sedentary recently     PATIENT GOALS: To get myself going to help bring back some of what I've lost.   OBJECTIVE: (objective measures completed at initial evaluation unless  otherwise dated)  DIAGNOSTIC FINDINGS:  09/15/23 - CT CERVICAL SPINE WITHOUT CONTRAST (s/p fall, hit right-side) IMPRESSION: 1. No acute cervical spine fracture. 2. Extensive multilevel cervical degenerative changes.  09/15/23 - CT HEAD WITHOUT CONTRAST (s/p fall, hit right-side) IMPRESSION: 1. No acute intracranial process.  09/15/23 - RIGHT SHOULDER - 2+ VIEW (s/p fall, hit right-side) IMPRESSION: 1. No acute fracture. 2. Bone hypertrophy likely from remote trauma to the distal clavicle. Heterotopic bone formation likely from remote trauma to the coracoclavicular ligament. 3. Mild high-riding humeral head which may be secondary to a superior rotator cuff tear. 4. Mild glenohumeral osteoarthritis.  09/15/23 - LEFT FOOT - COMPLETE 3+ VIEW (L foot pain) IMPRESSION: 1. No significant change from prior.  No acute fracture. 2. Mild hallux valgus. 3. Postsurgical changes of second and third PIP arthrodesis.  09/29/23 - MR FOOT LEFT W WO CONTRAST FINDINGS: Postoperative change with metal artifact to the distal second, third, and fourth digits. No fracture or dislocation. No erosions. Mild second tarsometatarsal joint osteoarthritis with joint space narrowing and mild degenerative edema. The visualized marrow signal is otherwise unremarkable.   No obvious soft tissue wound/ulceration. This may be obscured by metal artifact. Moderate diffuse muscle atrophy. Mild likely reactive myositis. The tendons are unremarkable. Moderate to severe dorsal subcutaneous edema. No organized fluid collection.   IMPRESSION: The exam is slightly limited by metal artifact.   Moderate to severe dorsal subcutaneous edema. Correlate for lymphedema versus cellulitis.   No obvious soft tissue wound/ulceration or evidence of osteomyelitis. If continued clinical concern, CT scan and/or bone scan could be performed to further characterize. This would limit the metal artifact.   Diffuse muscle atrophy.   Mild  degenerative change.  COGNITION: Overall cognitive status: Impaired - decreased short-term memory and recall of new information    SENSATION: WFL  COORDINATION: B  heel-toe mildly diminished. Impaired heel to shin bilaterally.  MUSCLE TONE: Increased LE muscle tone noted during attempts at PROM/flexibility assessment.   POSTURE:  rounded shoulders, forward head, increased thoracic kyphosis, and flexed trunk   MUSCLE LENGTH: Hamstrings: mod tight B ITB: mild tight B Piriformis: mod/severe tight B Hip flexors: mod/severe tight B Quads: mild/mod tight B Heelcord: mild tight B  LOWER EXTREMITY ROM:    Mildly limited B hip and ankle ROM (R>L), mostly due to limited muscle flexibility as above.   LOWER EXTREMITY MMT:    MMT Right eval Left eval  Hip flexion 4+ 4+  Hip extension 4- 4  Hip abduction 4 4  Hip adduction 4+ 4+  Hip internal rotation 5 5  Hip external rotation 4+ 4+  Knee flexion 4+ 4+  Knee extension 4+ 4+  Ankle dorsiflexion 4+ 4+  Ankle plantarflexion    Ankle inversion    Ankle eversion    (Blank rows = not tested)  BED MOBILITY:  Minimal assist overall  TRANSFERS: Assistive device utilized: Environmental consultant - 2 wheeled  Sit to stand: Modified independence and SBA - cues to push up from chair rather than pull up on walker Stand to sit: Modified independence Chair to chair: NT Floor: NT  GAIT: Distance walked: Clinic distances Assistive device utilized: Environmental consultant - 2 wheeled Level of assistance: Modified independence Gait pattern: step through pattern, decreased stride length, shuffling, festinating, and trunk flexed Comments: Patient reports intermittent freezing episodes, most common in the morning and subsiding as the day progresses  FUNCTIONAL TESTS:  5 times sit to stand: 12/13/23 23 seconds back on heels Timed up and go (TUG): 12/13/23  no device 23 seconds lost balance with turn Manual =  Cognitive =  10 meter walk test: 23.25 sec with 2WW Gait  Speed: 1.41 ft/sec w/o AD, limited community ambulator; gait speed of <1.8 ft/sec indicates the risk for recurrent falls  Berg Balance Scale: 12/13/23 = 41/56 Functional gait assessment: TBA Interpretation of FGA scores: Non-Specific Older Adults Cutoff Score: <=22/30 = risk of falls Parkinson's Disease Cutoff score <15/30= fall risk (Hoehn & Yahr 1-4)  Minimally Clinically Important Difference (MCID)  Stroke (acute, subacute, and chronic) = MDC: 4.2 points Vestibular (acute) = MDC: 6 points Community Dwelling Older Adults =  MCID: 4 points Parkinson's Disease  =  MDC: 4.3 points  (Academy of Neurologic Physical Therapy (nd). Functional Gait Assessment. Retrieved from https://www.neuropt.org/docs/default-source/cpgs/core-outcome-measures/function-gait-assessment-pocket-guide-proof9-(2).pdf?sfvrsn=b92f35043_0.)  Baseline as of D/C from last PT episode (11/20/22): 11/15/22: 5xSTS = 13.88 sec w/o UE assist (occasional hands on knees) TUG:  Normal = 9.93 sec Manual = 10.19 sec Cognitive = 13.16 sec (unable to complete cognitive task) : w/o AD = 10.62 sec, 9.32 sec (fastest comfortable speed w/o AD) with SPC = 11.59 sec with B hiking poles = 11.62 sec Gait speed: 3.09 ft/sec w/o AD  3.52 ft/sec (fastest comfortable speed w/o AD) 2.83 ft/sec with SPC  2.82 ft/sec with B hiking poles    11/20/22: Berg = 50/56, 46-51 moderate risk for falls (>50%)  FGA = 21/30, 19-24 = medium risk for fall   PATIENT SURVEYS:  ABC scale: The Activities-Specific Balance Confidence (ABC) Scale 0% 10 20 30  40 50 60 70 80 90 100% No confidence<->completely confident  "How confident are you that you will not lose your balance or become unsteady when you . . .   Date tested 11/28/23 *confidence with RW  Walk around the house 80%  2. Walk up or down stairs 50%  3. Bend over and pick up a slipper from in front of a closet floor 70%  4. Reach for a small can off a shelf at eye level 100%  5. Stand on tip  toes and reach for something above your head 60%  6. Stand on a chair and reach for something 0%  7. Sweep the floor 0%  8. Walk outside the house to a car parked in the driveway 80%  9. Get into or out of a car 90%  10. Walk across a parking lot to the mall 70%  11. Walk up or down a ramp 70%  12. Walk in a crowded mall where people rapidly walk past you 60%  13. Are bumped into by people as you walk through the mall 70%  14. Step onto or off of an escalator while you are holding onto the railing 80%  15. Step onto or off an escalator while holding onto parcels such that you cannot hold onto the railing 0%  16. Walk outside on icy sidewalks 0%  Total: #/16 880 / 1600 = 55.0 %  50-80% = moderate level of physical functioning <69% indicates risk for recurrent falls in PD   TODAY'S TREATMENT:   12/26/2023 THERAPEUTIC EXERCISE: To improve strength, endurance, and flexibility.  Demonstration, verbal and tactile cues throughout for technique.  NuStep - L6 x 6 min - UE/LE to promote reciprocal movement patterns Seated hip hinge HS stretch x 30 bil Seated lunge position hip flexor/quad stretch x 30 bil Seated GTB HS curls x 10 bil Seated GTB LAQ x 10 bil  MANUAL THERAPY: To promote normalized muscle tension, improved flexibility, pain modulation, and reduced pain utilizing connective tissue massage, therapeutic massage, and myofascial release. Foam roller to B quads x ~2 min Roller stick to R/L hamstrings x 1-2 min  NEUROMUSCULAR RE-EDUCATION: To improve balance, coordination, kinesthesia, posture, proprioception, reduce fall risk, and amplitude of movement. Standing 4-way GTB SLR with RW support for balance x 10   12/23/2023  THERAPEUTIC EXERCISE: To improve strength and endurance.  Demonstration, verbal and tactile cues throughout for technique.  NuStep - L6 x 6 min Seated GTB B scap retraction + shoulder row x 10  Seated GTB B scap retraction + shoulder extension x 10  Seated bent  over scap retraction + B shoulder extension x 10 with 5# db bilaterally Seated GTB hip ABD/ER clam 2 x 10 Seated GTB hip flexion march x 10  NEUROMUSCULAR RE-EDUCATION: To improve balance, coordination, kinesthesia, posture, proprioception, reduce fall risk, amplitude of movement, speed of movement to reduce bradykinesia, and reduce rigidity. Seated PWR! Moves: Up x 10 Rock x 10 Twist x 10 Step x 10 Standing hip extension with looped GTB at ankles x 10, UE support on RW (encouraged counter support at home) Standing hip abduction with looped GTB at ankles x 10, UE support on RW  THERAPEUTIC ACTIVITIES: To improve functional performance.  Demonstration, verbal and tactile cues throughout for technique. Sit to stand from Airex pad in chair w/o UE assist + GTB hip ABD isometric at distal thighs x 10   12/19/23 Nustep L5x27min UE/LE Seated Lumbar flexion stretch green pball x 30' 3 way Seated hamstring stretch x 30' BLE Seated figure 4 stretch x 30 BLE Seated side bend stretch x 30' B Standing runner stretch x 30' Seated thoracic ext HBH 5x10 Standing pallof press GTB doubled x 10  Church pew weight shifts x 10   12/13/23 Nustep level 5 x 6 minutes  TUG =  23 seconds, unsteady with turn no device but did touch wall 5XSTS = 23 seconds  really back on heels Berg = 41/56 On airex standing On airex ball toss, had to have another person for CGA tended to lose balance to the back Step over side to sided and forward back PVC T CGA Direction changes   11/19/2023  SELF CARE:  Reviewed eval findings and role of PT in addressing identified deficits as well as need for further assessment of balance.  Provided education on safe hand placement with RW during transfers to reduce fall risk.   PATIENT EDUCATION:  Education details: HEP update - postural and hip strengthening  Person educated: Patient Education method: Explanation, Demonstration, Verbal cues, and Handouts Education  comprehension: verbalized understanding, returned demonstration, verbal cues required, and needs further education  HOME EXERCISE PROGRAM: Access Code: [email or web address] URL: [email or web address] Date: 12/23/2023 Prepared by: Elijah Hidden  Exercises - Supine Lower Trunk Rotation  - 2 x daily - 7 x weekly - 2 sets - 10 reps - 10 sec hold - Seated Thoracic Lumbar Extension with Pectoralis Stretch  - 2 x daily - 7 x weekly - 2 sets - 10 reps - 5 sec hold - Seated Hamstring Stretch  - 2 x daily - 7 x weekly - 3 reps - 30 sec hold - Seated Figure 4 Piriformis Stretch  - 2 x daily - 7 x weekly - 3 reps - 30 sec hold - Standing Gastroc Stretch at Counter  - 2 x daily - 7 x weekly - 3 reps - 30 sec hold - Seated Quadratus Lumborum Stretch in Chair  - 2 x daily - 7 x weekly - 3 reps - 30 sec hold - Seated Flexion Stretch with Swiss Ball  - 2 x daily - 7 x weekly - 3 reps - 30 sec hold - Seated Thoracic Flexion and Rotation with Swiss Ball  - 2 x daily - 7 x weekly - 3 reps - 30 sec hold - Seated Bent Over Shoulder Row with Dumbbells  - 1 x daily - 3 x weekly - 2 sets - 10 reps - 3 sec hold - Seated Shoulder Extension with Dumbbells  - 1 x daily - 3 x weekly - 2 sets - 10 reps - 3 sec hold - Standing Hip Extension with Resistance at Ankles and Counter Support  - 1 x daily - 3 x weekly - 2 sets - 10 reps - 3 sec hold - Standing Hip Abduction with Resistance at Ankles and Counter Support  - 1 x daily - 3 x weekly - 2 sets - 10 reps - 3 sec hold  Patient Education - Tips to reduce freezing episodes with standing or walking   ASSESSMENT:  CLINICAL IMPRESSION: Vincent Walker reports lingering increased muscle soreness since PT visit earlier this week and requested to address the soreness in his thighs from his knees to his hips.  Reviewed stretching regimen to help address hamstring and quad/hip flexor tightness as well as self-STM options using foam roller or rolling pin to help alleviate post-exercise  muscle soreness, with patient reporting improvement in soreness following stretches and MT.  Continued proximal LE strengthening adding increased balance component with single-leg stance for four-way hip SLR using walker for balance, however encouraged patient to continue to use counter for support at home performing exercises as provided in current HEP.  Vincent Walker will benefit from continued skilled PT to address ongoing postural, strength and balance  deficits to  improve mobility and activity tolerance with decreased pain interference and decreased risk for falls.   EVAL: Vincent Walker is a 78 y.o. male who was referred to physical therapy for evaluation and treatment for Parkinson's disease.  Patient first diagnosed with Parkinson's 5+ years ago.  He has completed 2 prior PT episodes in 2022/2023 and 2024 within the [facility] system.  Most recent PT episode for Parkinson's disease was 09/25/2022 - 11/20/2022.  Since his last PT episode, he reports worsening of his PD leading to increased gait impairments and need to switch from single-point cane to RW/2WW.  Patient presents with physical impairments of decreased timing and coordination of gait, impaired ambulation, impaired standing balance, abnormal posture, bradykinesia with transfers, impaired activity tolerance, LE weakness, postural instability and decreased safety awareness impacting safe and independent functional mobility.  Examination revealed patient is at risk for falls and functional decline as evidenced by the following objective test measures: Gait speed 1.41 ft/sec, (2.62 ft/sec is needed for community access and <1.8 ft/sec is indicative of risk for recurrent falls), which demonstrates a significant decline from gait speed of.  He reports increased incidence of freezing of gait, most common in the morning and subsiding as the day progresses.  He has had 2 falls in the past 6 months, both of which occurred while turning with the RW.  Further  balance testing with 5xSTS, TUG, Berg and/or DGI/FGA indicated however patient needing to use the bathroom preventing testing today.  ABC scale score of 55% indicates a moderate level of physical functioning, and score of <69% indicates risk for recurrent falls in PD.  Vincent Walker will benefit from skilled PT to address above deficits to improve mobility and activity tolerance to help reach the maximal level of functional independence with mobility and gait with reduced risk for falls.  Patient and his wife demonstrates understanding of this POC and are in agreement with this plan.   OBJECTIVE IMPAIRMENTS: Abnormal gait, decreased activity tolerance, decreased balance, decreased coordination, decreased knowledge of condition, decreased knowledge of use of DME, decreased mobility, difficulty walking, decreased ROM, decreased strength, decreased safety awareness, increased fascial restrictions, impaired perceived functional ability, impaired flexibility, improper body mechanics, postural dysfunction, and pain.   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   REHAB POTENTIAL: Good  CLINICAL DECISION MAKING: Unstable/unpredictable  EVALUATION COMPLEXITY: High   GOALS: Goals reviewed with patient? Yes  SHORT TERM GOALS: Target date: 01/09/2024  Patient will be independent with initial HEP. Baseline: TBD Goal status: IN PROGRESS -  12/23/23 - Pt reports no concerns with current HEP exercises  2.  Patient will demonstrate decreased fall risk by scoring < 25 sec on TUG. Baseline: TBA Goal status: MET - 12/13/23 - 23 seconds  3.  Patient will be educated on strategies to decrease risk of falls.  Baseline:  Goal status: INITIAL  4.  Patient will verbalize tips to reduce freezing/festination with gait and turns. Baseline: Patient reports freezing episodes most common in the morning, subsiding as the day progresses. Goal status: INITIAL  LONG TERM GOALS: Target date: 02/20/2024  Patient will be independent with ongoing/advanced HEP for self-management at home incorporating PWR! Moves as indicated .  Baseline:  Goal status: INITIAL  2.  Patient will be able to ambulate 600' with LRAD with good safety to access community.  Baseline:  Goal status: INITIAL  3.  Patient will be able to step up/down curb safely with LRAD for safety with community ambulation.  Baseline:  Goal status: INITIAL   4.  Patient will demonstrate gait speed of >/= 1.8 ft/sec (0.55 m/s) to be a safe limited community ambulator with decreased risk for recurrent falls.  Baseline: 1.41 ft/sec with RW Goal status: INITIAL  5.  Patient will improve 5x STS time to </= 16 seconds to demonstrate improved functional strength and transfer efficiency. Baseline: TBA Goal status: INITIAL  6.  Patient will demonstrate at least 19/24 on DGI or 19/30 on FGA to improve gait stability and reduce risk for falls. Baseline: TBA Goal status: INITIAL  7.  Patient will improve Berg score by at least 8 points to improve safety and stability with ADLs in standing and reduce risk for falls.   Baseline: TBA Goal status: INITIAL  8.  Patient will report >/= 69% on ABC scale to demonstrate improved balance confidence and decreased risk for falls. Baseline: 880 / 1600 = 55.0 % Goal status: INITIAL  9. Patient will verbalize understanding of local Parkinson's disease  community resources, including community fitness post d/c. Baseline:  Goal status: INITIAL   PLAN:  PT FREQUENCY: 2x/week  PT DURATION: 12 weeks  PLANNED INTERVENTIONS: 97164- PT Re-evaluation, 97750- Physical Performance Testing, 97110-Therapeutic exercises, 97530- Therapeutic activity, 97112- Neuromuscular re-education, 97535- Self Care, [id number]- Manual therapy, [phone]- Gait training, [phone]- Electrical stimulation (unattended), 97035- Ultrasound, [id number]- Ionotophoresis 4mg /ml Dexamethasone , 79439 (1-2 muscles), [id number] (3+ muscles)- Dry Needling, Patient/Family education, Balance training, Stair training, Taping, Joint mobilization, Spinal mobilization, Cryotherapy, and Moist heat  PLAN FOR NEXT SESSION: Fall risk education; education on tips to reduce freezing as indicated; LE flexibility; core/postural strengthening   Elijah CHRISTELLA Hidden, PT 12/26/2023, 6:25 PM

## 2023-12-30 ENCOUNTER — Other Ambulatory Visit (HOSPITAL_BASED_OUTPATIENT_CLINIC_OR_DEPARTMENT_OTHER): Payer: Self-pay

## 2023-12-30 ENCOUNTER — Encounter: Payer: Self-pay | Admitting: Physical Therapy

## 2023-12-30 ENCOUNTER — Other Ambulatory Visit: Payer: Self-pay | Admitting: Neurology

## 2023-12-30 ENCOUNTER — Ambulatory Visit: Admitting: Physical Therapy

## 2023-12-30 ENCOUNTER — Other Ambulatory Visit: Payer: Self-pay

## 2023-12-30 DIAGNOSIS — G20A2 Parkinson's disease without dyskinesia, with fluctuations: Secondary | ICD-10-CM | POA: Diagnosis not present

## 2023-12-30 DIAGNOSIS — R2681 Unsteadiness on feet: Secondary | ICD-10-CM

## 2023-12-30 DIAGNOSIS — G20A1 Parkinson's disease without dyskinesia, without mention of fluctuations: Secondary | ICD-10-CM

## 2023-12-30 DIAGNOSIS — M6281 Muscle weakness (generalized): Secondary | ICD-10-CM

## 2023-12-30 DIAGNOSIS — R296 Repeated falls: Secondary | ICD-10-CM | POA: Diagnosis not present

## 2023-12-30 DIAGNOSIS — R2689 Other abnormalities of gait and mobility: Secondary | ICD-10-CM | POA: Diagnosis not present

## 2023-12-30 DIAGNOSIS — Z9181 History of falling: Secondary | ICD-10-CM | POA: Diagnosis not present

## 2023-12-30 MED ORDER — CARBIDOPA-LEVODOPA ER 50-200 MG PO TBCR
1.0000 | EXTENDED_RELEASE_TABLET | Freq: Every day | ORAL | 0 refills | Status: DC
  Filled 2023-12-30: qty 90, 90d supply, fill #0

## 2023-12-30 NOTE — Therapy (Signed)
 OUTPATIENT PHYSICAL THERAPY PARKINSON'S TREATMENT   Patient Name: Vincent [name] MRN: [id number] DOB:01-Aug-1945, 78 y.o., male Today's Date: 12/30/2023   END OF SESSION:  PT End of Session - 12/30/23 1314     Visit Number 6    Date for Recertification  02/20/24    Authorization Type Medicare & Federal BCBS    PT Start Time 1314    PT Stop Time 1356    PT Time Calculation (min) 42 min    Activity Tolerance Patient tolerated treatment well;Patient limited by fatigue    Behavior During Therapy Vincent [name] for tasks assessed/performed;Flat affect            Past Medical History:  Diagnosis Date   Adenomatous polyp of ascending colon 10/09/2018   Allergies 07/07/2018   Arthritis    Astigmatism of both eyes with presbyopia 08/21/2022   Blepharitis of upper and lower eyelids of both eyes 01/10/2016   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 Required nasal packing a couple of days ago.  Had a similar episode of epistaxis a little over a year ago.  At that visit, I could not see the exact bleeding spot. EXAMINATION after packing removal reveals several excoriated areas anteriorly but I was unable to see t   Essential hypertension 12/25/2011   Eustachian tube dysfunction, left 02/21/2021   Flank pain 07/19/2022   History of blood transfusion 2010   After Kidney surgery   History of chicken pox    History of renal cell carcinoma 02/08/2015   diagnosed and removed in 2010 Right Monitored by Dr Tanda Moats of urology at [facility] Dr Claudine Alley, nephrology at [name]   Hyperglycemia 12/23/2015   Increased thyroid  stimulating hormone (TSH) level 06/07/2017   Ingrown left big toenail 02/14/2015    Left foot pain 02/14/2015   MVP (mitral valve prolapse) 05/14/2016   Parkinson's disease (HCC) 12/23/2015   Posterior vitreous detachment of both eyes 08/21/2022   Primary open-angle glaucoma, left eye, severe stage 05/05/2021   Primary open-angle glaucoma, right eye, moderate stage 01/10/2016   Pseudophakia, both eyes 08/21/2022   Right hip pain 12/12/2016   Stage 3 chronic kidney disease 12/25/2011   Thrombocytopenia 02/08/2015   Tremor 02/14/2015   Past Surgical History:  Procedure Laterality Date   APPENDECTOMY  1995   CATARACT EXTRACTION, BILATERAL     COLONOSCOPY     COLONOSCOPY WITH PROPOFOL  N/A 12/17/2018   Procedure: COLONOSCOPY WITH PROPOFOL ;  Surgeon: Wilhelmenia Aloha Raddle., MD;  Location: [name] ENDOSCOPY;  Service: Gastroenterology;  Laterality: N/A;   ENDOSCOPIC MUCOSAL RESECTION N/A 12/17/2018   Procedure: ENDOSCOPIC MUCOSAL RESECTION;  Surgeon: Wilhelmenia Aloha Raddle., MD;  Location: [name] ENDOSCOPY;  Service: Gastroenterology;  Laterality: N/A;   HEMOSTASIS CLIP PLACEMENT  12/17/2018   Procedure: HEMOSTASIS CLIP PLACEMENT;  Surgeon: Wilhelmenia Aloha Raddle., MD;  Location: [name] ENDOSCOPY;  Service: Gastroenterology;;   left knee scope  2003   POLYPECTOMY  12/17/2018   Procedure: POLYPECTOMY;  Surgeon: Wilhelmenia Aloha Raddle., MD;  Location: [name] ENDOSCOPY;  Service: Gastroenterology;;   right knee scope  1999   SUBMUCOSAL LIFTING INJECTION  12/17/2018   Procedure: SUBMUCOSAL LIFTING INJECTION;  Surgeon: Wilhelmenia Aloha Raddle., MD;  Location: [name] ENDOSCOPY;  Service: Gastroenterology;;  TOE SURGERY Left    metal 2nd toe- and top of foot- straighten bone   TONSILLECTOMY     TOTAL KNEE ARTHROPLASTY Left 07/06/2014   TOTAL NEPHRECTOMY Right    Patient Active Problem List   Diagnosis Date Noted   Cellulitis of left foot 09/17/2023   Dementia due to Parkinson's disease 11/20/2022   Dry eye syndrome of both eyes 08/28/2022   Astigmatism of both eyes with presbyopia 08/21/2022    Choroidal nevus of right eye 08/21/2022   Posterior vitreous detachment of both eyes 08/21/2022   Pseudophakia, both eyes 08/21/2022   Primary open-angle glaucoma, left eye, severe stage 05/05/2021   Eustachian tube dysfunction, left 02/21/2021   Early dry stage nonexudative age-related macular degeneration of both eyes 03/09/2019   Adenomatous polyp of ascending colon 10/09/2018   Epistaxis 07/07/2018   Allergies 07/07/2018   Obesity 06/15/2018   Increased thyroid  stimulating hormone (TSH) level 06/07/2017   MVP (mitral valve prolapse) 05/14/2016   Blepharitis of upper and lower eyelids of both eyes 01/10/2016   Primary open-angle glaucoma, right eye, moderate stage 01/10/2016   Parkinson's disease (HCC) 12/23/2015   Hyperglycemia 12/23/2015   Tremor 02/14/2015   Dyslipidemia 02/08/2015   History of renal cell carcinoma 02/08/2015   Thrombocytopenia 02/08/2015   History of chicken pox    Essential hypertension 12/25/2011   Stage 3 chronic kidney disease 12/25/2011    PCP: Domenica Harlene LABOR, MD   REFERRING PROVIDER: Evonnie Asberry RAMAN, DO   REFERRING DIAG: G20.A2 (ICD-10-CM) - Parkinson's disease without dyskinesia, with fluctuating manifestations (HCC)   THERAPY DIAG:  Parkinson's disease without dyskinesia, with fluctuations (HCC)  Other abnormalities of gait and mobility  Unsteadiness on feet  Muscle weakness (generalized)  Repeated falls  RATIONALE FOR EVALUATION AND TREATMENT: Rehabilitation  ONSET DATE: PD diagnosis 5+ years ago   NEXT MD VISIT: 04/14/24   SUBJECTIVE:                                                                                                                                                                                                         SUBJECTIVE STATEMENT: Patient denies any issues with current HEP but does not recall tips to reduce freezing.   EVAL:  Pt reports worsening of his Parkinson's disease. His biggest concern is that he  is having a harder time walking and and to switch to a 2WW/RW from his cane.  He states his wife also notes more forgetfulness/memory issues. He recognizes increased freezing of gait, typcially worse in the mornings and lessening as the  day progresses.  He is worried about falling and injuring himself to the point where he will need someone to take care of him.  Pt accompanied by: significant other - wife [facility]) in waiting room   PAIN: Are you having pain? No  Are you having pain? 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 Yes. Number of falls 2 (both while using RW to go around a corner)  LIVING ENVIRONMENT: Lives with: lives with their spouse Lives in: House/apartment Stairs: No Has following equipment at home: Single point cane, Environmental consultant - 2 wheeled, Grab bars, and Recumbent Bike  OCCUPATION: Retired  PLOF: Independent with household mobility with device, Independent with community mobility with device, Needs assistance with homemaking, and Leisure: mostly sedentary recently     PATIENT GOALS: To get myself going to help bring back some of what I've lost.   OBJECTIVE: (objective measures completed at initial evaluation unless otherwise dated)  DIAGNOSTIC FINDINGS:  09/15/23 - CT CERVICAL SPINE WITHOUT CONTRAST (s/p fall, hit right-side) IMPRESSION: 1. No acute cervical spine fracture. 2. Extensive multilevel cervical degenerative  changes.  09/15/23 - CT HEAD WITHOUT CONTRAST (s/p fall, hit right-side) IMPRESSION: 1. No acute intracranial process.  09/15/23 - RIGHT SHOULDER - 2+ VIEW (s/p fall, hit right-side) IMPRESSION: 1. No acute fracture. 2. Bone hypertrophy likely from remote trauma to the distal clavicle. Heterotopic bone formation likely from remote trauma to the coracoclavicular ligament. 3. Mild high-riding humeral head which may be secondary to a superior rotator cuff tear. 4. Mild glenohumeral osteoarthritis.  09/15/23 - LEFT FOOT - COMPLETE 3+ VIEW (L foot pain) IMPRESSION: 1. No significant change from prior.  No acute fracture. 2. Mild hallux valgus. 3. Postsurgical changes of second and third PIP arthrodesis.  09/29/23 - MR FOOT LEFT W WO CONTRAST FINDINGS: Postoperative change with metal artifact to the distal second, third, and fourth digits. No fracture or dislocation. No erosions. Mild second tarsometatarsal joint osteoarthritis with joint space narrowing and mild degenerative edema. The visualized marrow signal is otherwise unremarkable.   No obvious soft tissue wound/ulceration. This may be obscured by metal artifact. Moderate diffuse muscle atrophy. Mild likely reactive myositis. The tendons are unremarkable. Moderate to severe dorsal subcutaneous edema. No organized fluid collection.   IMPRESSION: The exam is slightly limited by metal artifact.   Moderate to severe dorsal subcutaneous edema. Correlate for lymphedema versus cellulitis.   No obvious soft tissue wound/ulceration or evidence of osteomyelitis. If continued clinical concern, CT scan and/or bone scan could be performed to further characterize. This would limit the metal artifact.   Diffuse muscle atrophy.   Mild degenerative change.  COGNITION: Overall cognitive status: Impaired - decreased short-term memory and recall of new information    SENSATION: WFL  COORDINATION: B heel-toe mildly diminished. Impaired  heel to shin bilaterally.  MUSCLE TONE: Increased LE muscle tone noted during attempts at PROM/flexibility assessment.   POSTURE:  rounded shoulders, forward head, increased thoracic kyphosis, and flexed trunk   MUSCLE  LENGTH: Hamstrings: mod tight B ITB: mild tight B Piriformis: mod/severe tight B Hip flexors: mod/severe tight B Quads: mild/mod tight B Heelcord: mild tight B  LOWER EXTREMITY ROM:    Mildly limited B hip and ankle ROM (R>L), mostly due to limited muscle flexibility as above.   LOWER EXTREMITY MMT:    MMT Right eval Left eval  Hip flexion 4+ 4+  Hip extension 4- 4  Hip abduction 4 4  Hip adduction 4+ 4+  Hip internal rotation 5 5  Hip external rotation 4+ 4+  Knee flexion 4+ 4+  Knee extension 4+ 4+  Ankle dorsiflexion 4+ 4+  Ankle plantarflexion    Ankle inversion    Ankle eversion    (Blank rows = not tested)  BED MOBILITY:  Minimal assist overall  TRANSFERS: Assistive device utilized: Environmental consultant - 2 wheeled  Sit to stand: Modified independence and SBA - cues to push up from chair rather than pull up on walker Stand to sit: Modified independence Chair to chair: NT Floor: NT  GAIT: Distance walked: Clinic distances Assistive device utilized: Environmental consultant - 2 wheeled Level of assistance: Modified independence Gait pattern: step through pattern, decreased stride length, shuffling, festinating, and trunk flexed Comments: Patient reports intermittent freezing episodes, most common in the morning and subsiding as the day progresses  FUNCTIONAL TESTS:  5 times sit to stand: 12/13/23 23 seconds back on heels Timed up and go (TUG): 12/13/23  no device 23 seconds lost balance with turn Manual =  Cognitive =  10 meter walk test: 23.25 sec with 2WW Gait Speed: 1.41 ft/sec w/o AD, limited community ambulator; gait speed of <1.8 ft/sec indicates the risk for recurrent falls  Berg Balance Scale: 12/13/23 = 41/56 Functional gait assessment: TBA Interpretation of FGA  scores: Non-Specific Older Adults Cutoff Score: <=22/30 = risk of falls Parkinson's Disease Cutoff score <15/30= fall risk (Hoehn & Yahr 1-4)  Minimally Clinically Important Difference (MCID)  Stroke (acute, subacute, and chronic) = MDC: 4.2 points Vestibular (acute) = MDC: 6 points Community Dwelling Older Adults =  MCID: 4 points Parkinson's Disease  =  MDC: 4.3 points  (Academy of Neurologic Physical Therapy (nd). Functional Gait Assessment. Retrieved from https://www.neuropt.org/docs/default-source/cpgs/core-outcome-measures/function-gait-assessment-pocket-guide-proof9-(2).pdf?sfvrsn=b59f35043_0.)  Baseline as of D/C from last PT episode (11/20/22): 11/15/22: 5xSTS = 13.88 sec w/o UE assist (occasional hands on knees) TUG:  Normal = 9.93 sec Manual = 10.19 sec Cognitive = 13.16 sec (unable to complete cognitive task) : w/o AD = 10.62 sec, 9.32 sec (fastest comfortable speed w/o AD) with SPC = 11.59 sec with B hiking poles = 11.62 sec Gait speed: 3.09 ft/sec w/o AD  3.52 ft/sec (fastest comfortable speed w/o AD) 2.83 ft/sec with SPC  2.82 ft/sec with B hiking poles    11/20/22: Berg = 50/56, 46-51 moderate risk for falls (>50%)  FGA = 21/30, 19-24 = medium risk for fall   PATIENT SURVEYS:  ABC scale: The Activities-Specific Balance Confidence (ABC) Scale 0% 10 20 30  40 50 60 70 80 90 100% No confidence<->completely confident  "How confident are you that you will not lose your balance or become unsteady when you . . .   Date tested 11/28/23 *confidence with RW  Walk around the house 80%  2. Walk up or down stairs 50%  3. Bend over and pick up a slipper from in front of a closet floor 70%  4. Reach for a small can off a shelf at eye level 100%  5. Stand on tip toes and  reach for something above your head 60%  6. Stand on a chair and reach for something 0%  7. Sweep the floor 0%  8. Walk outside the house to a car parked in the driveway 80%  9. Get into or out of a  car 90%  10. Walk across a parking lot to the mall 70%  11. Walk up or down a ramp 70%  12. Walk in a crowded mall where people rapidly walk past you 60%  13. Are bumped into by people as you walk through the mall 70%  14. Step onto or off of an escalator while you are holding onto the railing 80%  15. Step onto or off an escalator while holding onto parcels such that you cannot hold onto the railing 0%  16. Walk outside on icy sidewalks 0%  Total: #/16 880 / 1600 = 55.0 %  50-80% = moderate level of physical functioning <69% indicates risk for recurrent falls in PD   TODAY'S TREATMENT:   12/30/2023  THERAPEUTIC EXERCISE: To improve strength and endurance.    Rec Bike - L3 x 6 min  SELF CARE: Provided education to reduce fall risk and to promote safe home environment. Reviewed the Check for Safety - Home Fall Prevention Checklist for Older Adults to help identify fall risk hazards in the home along with strategies to reduce fall risk at home  Reviewed education on tips to prevent freezing with transfers and gait.    NEUROMUSCULAR RE-EDUCATION: To improve balance, coordination, kinesthesia, posture, proprioception, reduce fall risk, amplitude of movement, speed of movement to reduce bradykinesia, and reduce rigidity. Seated PWR! Moves: Up x 10 Rock x 10 Twist x 10 Step x 10 PWR! Sit to stand x 10 Standing PWR! Moves: (standing in front on mat table with chair in front for intermittent UE support as needed) Up x 10 Rock x 10 Twist x 10 Step x 10    12/26/2023 THERAPEUTIC EXERCISE: To improve strength, endurance, and flexibility.  Demonstration, verbal and tactile cues throughout for technique.  NuStep - L6 x 6 min - UE/LE to promote reciprocal movement patterns Seated hip hinge HS stretch x 30 bil Seated lunge position hip flexor/quad stretch x 30 bil Seated GTB HS curls x 10 bil Seated GTB LAQ x 10 bil  MANUAL THERAPY: To promote normalized muscle tension, improved  flexibility, pain modulation, and reduced pain utilizing connective tissue massage, therapeutic massage, and myofascial release. Foam roller to B quads x ~2 min Roller stick to R/L hamstrings x 1-2 min  NEUROMUSCULAR RE-EDUCATION: To improve balance, coordination, kinesthesia, posture, proprioception, reduce fall risk, and amplitude of movement. Standing 4-way GTB SLR with RW support for balance x 10   12/23/2023  THERAPEUTIC EXERCISE: To improve strength and endurance.  Demonstration, verbal and tactile cues throughout for technique.  NuStep - L6 x 6 min Seated GTB B scap retraction + shoulder row x 10  Seated GTB B scap retraction + shoulder extension x 10  Seated bent over scap retraction + B shoulder extension x 10 with 5# db bilaterally Seated GTB hip ABD/ER clam 2 x 10 Seated GTB hip flexion march x 10  NEUROMUSCULAR RE-EDUCATION: To improve balance, coordination, kinesthesia, posture, proprioception, reduce fall risk, amplitude of movement, speed of movement to reduce bradykinesia, and reduce rigidity. Seated PWR! Moves: Up x 10 Rock x 10 Twist x 10 Step x 10 Standing hip extension with looped GTB at ankles x 10, UE support on RW (encouraged counter support  at home) Standing hip abduction with looped GTB at ankles x 10, UE support on RW  THERAPEUTIC ACTIVITIES: To improve functional performance.  Demonstration, verbal and tactile cues throughout for technique. Sit to stand from Airex pad in chair w/o UE assist + GTB hip ABD isometric at distal thighs x 10   12/19/23 Nustep L5x34min UE/LE Seated Lumbar flexion stretch green pball x 30' 3 way Seated hamstring stretch x 30' BLE Seated figure 4 stretch x 30 BLE Seated side bend stretch x 30' B Standing runner stretch x 30' Seated thoracic ext HBH 5x10 Standing pallof press GTB doubled x 10  Church pew weight shifts x 10   12/13/23 Nustep level 5 x 6 minutes TUG =  23 seconds, unsteady with turn no device but did touch  wall 5XSTS = 23 seconds  really back on heels Berg = 41/56 On airex standing On airex ball toss, had to have another person for CGA tended to lose balance to the back Step over side to sided and forward back PVC T CGA Direction changes   11/19/2023  SELF CARE:  Reviewed eval findings and role of PT in addressing identified deficits as well as need for further assessment of balance.  Provided education on safe hand placement with RW during transfers to reduce fall risk.   PATIENT EDUCATION:  Education details: Check for Safety - Home Fall Prevention Checklist for Older Adults , PWR! Moves - sitting & standing, and tips to reduce freezing with transfers and gait  Person educated: Patient Education method: Explanation, Demonstration, Verbal cues, and Handouts Education comprehension: verbalized understanding, returned demonstration, verbal cues required, and needs further education  HOME EXERCISE PROGRAM: Access Code: [email or web address] URL: [email or web address] Date: 12/30/2023 Prepared by: Elijah Hidden  Exercises - Supine Lower Trunk Rotation  - 2 x daily - 7 x weekly - 2 sets - 10 reps - 10 sec hold - Seated Thoracic Lumbar Extension with Pectoralis Stretch  - 2 x daily - 7 x weekly - 2 sets - 10 reps - 5 sec hold - Seated Hamstring Stretch  - 2 x daily - 7 x weekly - 3 reps - 30 sec hold - Seated Figure 4 Piriformis Stretch  - 2 x daily - 7 x weekly - 3 reps - 30 sec hold - Standing Gastroc Stretch at Counter  - 2 x daily - 7 x weekly - 3 reps - 30 sec hold - Seated Quadratus Lumborum Stretch in Chair  - 2 x daily - 7 x weekly - 3 reps - 30 sec hold - Seated Flexion Stretch with Swiss Ball  - 2 x daily - 7 x weekly - 3 reps - 30 sec hold - Seated Thoracic Flexion and Rotation with Swiss Ball  - 2 x daily - 7 x weekly - 3 reps - 30 sec hold - Seated Bent Over Shoulder Row with Dumbbells  - 1 x daily - 3 x weekly - 2 sets - 10 reps - 3 sec hold - Seated Shoulder Extension with  Dumbbells  - 1 x daily - 3 x weekly - 2 sets - 10 reps - 3 sec hold - Standing Hip Extension with Resistance at Ankles and Counter Support  - 1 x daily - 3 x weekly - 2 sets - 10 reps - 3 sec hold - Standing Hip Abduction with Resistance at Ankles and Counter Support  - 1 x daily - 3 x weekly - 2 sets - 10 reps - 3 sec hold  Patient Education - Tips to reduce freezing episodes with standing or walking - Check for Safety  PWR! Moves: - Sitting - Standing   ASSESSMENT:  CLINICAL IMPRESSION: Lavonte reports limited recall of tips to reduce freezing of mobility and gait, therefore reviewed these today in addition to going over the Check for Safety - Home Fall Prevention Checklist for Older Adults to help identify fall risk hazards in the home along with strategies to reduce fall risk at home.  Reviewed seated PWR! Moves and introduced standing PWR! Moves with patient able to perform good return demonstration with only minor cueing, therefore instructions provided for home performance with patient encouraged to alternate performance of seated and standing PWR! Moves on opposite days, with expectation provided that he will eventually focus on 1 position for PWR! Moves each day x 5 days per week once training completed in remaining positions.  Kohen will benefit from continued skilled PT to address ongoing postural, strength and balance  deficits to improve mobility and activity tolerance with decreased pain interference and decreased risk for falls.   EVAL: XZAVIAN SEMMEL is a 78 y.o. male who was referred to physical therapy for evaluation and treatment for Parkinson's disease.  Patient first diagnosed with Parkinson's 5+ years ago.  He has completed 2 prior PT episodes in 2022/2023 and 2024 within the [name] system.  Most recent PT episode for Parkinson's disease was 09/25/2022 - 11/20/2022.  Since his last PT episode, he reports worsening of his PD leading to increased gait impairments and  need to switch from single-point cane to RW/2WW.  Patient presents with physical impairments of decreased timing and coordination of gait, impaired ambulation, impaired standing balance, abnormal posture, bradykinesia with transfers, impaired activity tolerance, LE weakness, postural instability and decreased safety awareness impacting safe and independent functional mobility.  Examination revealed patient is at risk for falls and functional decline as evidenced by the following objective test measures: Gait speed 1.41 ft/sec, (2.62 ft/sec is needed for community access and <1.8 ft/sec is indicative of risk for recurrent falls), which demonstrates a significant decline from gait speed of.  He reports increased incidence of freezing of gait, most common in the morning and subsiding as the day progresses.  He has had 2 falls in the past 6 months, both of which occurred while turning with the RW.  Further balance testing with 5xSTS, TUG, Berg and/or DGI/FGA indicated however patient needing to use the bathroom preventing testing today.  ABC scale score of 55% indicates a moderate level of physical functioning, and score of <69% indicates risk for recurrent falls in PD.  Cobie will benefit from skilled PT to address above deficits to improve mobility and activity tolerance to help reach the maximal level of functional independence with mobility and gait with reduced risk for falls.  Patient and his wife demonstrates understanding of this POC and are in agreement with this plan.   OBJECTIVE IMPAIRMENTS: Abnormal gait, decreased activity tolerance, decreased balance, decreased coordination, decreased knowledge of condition, decreased knowledge of use of DME, decreased mobility, difficulty walking, decreased ROM, decreased strength, decreased safety awareness, increased fascial restrictions, impaired perceived functional ability, impaired flexibility, improper body mechanics, postural dysfunction, and pain.   ACTIVITY  LIMITATIONS: carrying, lifting, bending, standing, squatting, stairs, transfers, bed mobility, locomotion level, and caring for others  PARTICIPATION LIMITATIONS: driving, shopping, community activity, and yard work  PERSONAL FACTORS: Age, Fitness, Past/current experiences, Time since onset of injury/illness/exacerbation, and 3+ comorbidities: Parkinson's disease, mild neurocognitive disorder  due to PD, HTN, MVP, L TKA, R knee scope, CRI, h/o renal cancer with R nephrectomy, thrombocytopenia, dyslipidemia, B macular degeneration, chronic LBP, chronic B knee pain   are also affecting patient's functional outcome.   REHAB POTENTIAL: Good  CLINICAL DECISION MAKING: Unstable/unpredictable  EVALUATION COMPLEXITY: High   GOALS: Goals reviewed with patient? Yes  SHORT TERM GOALS: Target date: 01/09/2024  Patient will be independent with initial HEP. Baseline: TBD Goal status: IN PROGRESS - 12/23/23 - Pt reports no concerns with current HEP exercises  2.  Patient will demonstrate decreased fall risk by scoring < 25 sec on TUG. Baseline: TBA Goal status: MET - 12/13/23 - 23 seconds  3.  Patient will be educated on strategies to decrease risk of falls.  Baseline:  Goal status: MET - 12/30/23  4.  Patient will verbalize tips to reduce freezing/festination with gait and turns. Baseline: Patient reports freezing episodes most common in the morning, subsiding as the day progresses. Goal status: IN PROGRESS - 12/30/23 - reviewed today  LONG TERM GOALS: Target date: 02/20/2024  Patient will be independent with ongoing/advanced HEP for self-management at home incorporating PWR! Moves as indicated .  Baseline:  Goal status: INITIAL  2.  Patient will be able to ambulate 600' with LRAD with good safety to access community.  Baseline:  Goal status: INITIAL  3.  Patient will be able to step up/down curb safely with LRAD for safety with community ambulation.  Baseline:  Goal status: INITIAL   4.   Patient will demonstrate gait speed of >/= 1.8 ft/sec (0.55 m/s) to be a safe limited community ambulator with decreased risk for recurrent falls.  Baseline: 1.41 ft/sec with RW Goal status: INITIAL  5.  Patient will improve 5x STS time to </= 16 seconds to demonstrate improved functional strength and transfer efficiency. Baseline: TBA Goal status: INITIAL  6.  Patient will demonstrate at least 19/24 on DGI or 19/30 on FGA to improve gait stability and reduce risk for falls. Baseline: TBA Goal status: INITIAL  7.  Patient will improve Berg score by at least 8 points to improve safety and stability with ADLs in standing and reduce risk for falls.   Baseline: TBA Goal status: INITIAL  8.  Patient will report >/= 69% on ABC scale to demonstrate improved balance confidence and decreased risk for falls. Baseline: 880 / 1600 = 55.0 % Goal status: INITIAL  9. Patient will verbalize understanding of local Parkinson's disease community resources, including community fitness post d/c. Baseline:  Goal status: INITIAL   PLAN:  PT FREQUENCY: 2x/week  PT DURATION: 12 weeks  PLANNED INTERVENTIONS: 97164- PT Re-evaluation, 97750- Physical Performance Testing, 97110-Therapeutic exercises, 97530- Therapeutic activity, [id number]- Neuromuscular re-education, 97535- Self Care, [id number]- Manual therapy, [phone]- Gait training, [phone]- Electrical stimulation (unattended), 97035- Ultrasound, 02966- Ionotophoresis 4mg /ml Dexamethasone , 79439 (1-2 muscles), 20561 (3+ muscles)- Dry Needling, Patient/Family education, Balance training, Stair training, Taping, Joint mobilization, Spinal mobilization, Cryotherapy, and Moist heat  PLAN FOR NEXT SESSION: Review education on tips to reduce freezing as indicated; LE flexibility; core/postural strengthening; review seated and standing PWR! Moves and re-introduce remaining positions as tolerated   Elijah CHRISTELLA Hidden, PT 12/30/2023, 2:01 PM

## 2024-01-02 ENCOUNTER — Encounter: Payer: Self-pay | Admitting: Physical Therapy

## 2024-01-02 ENCOUNTER — Ambulatory Visit: Attending: Neurology | Admitting: Physical Therapy

## 2024-01-02 DIAGNOSIS — R2689 Other abnormalities of gait and mobility: Secondary | ICD-10-CM | POA: Insufficient documentation

## 2024-01-02 DIAGNOSIS — M6281 Muscle weakness (generalized): Secondary | ICD-10-CM | POA: Insufficient documentation

## 2024-01-02 DIAGNOSIS — R296 Repeated falls: Secondary | ICD-10-CM | POA: Insufficient documentation

## 2024-01-02 DIAGNOSIS — G20A2 Parkinson's disease without dyskinesia, with fluctuations: Secondary | ICD-10-CM | POA: Insufficient documentation

## 2024-01-02 DIAGNOSIS — R2681 Unsteadiness on feet: Secondary | ICD-10-CM | POA: Insufficient documentation

## 2024-01-02 NOTE — Therapy (Addendum)
 OUTPATIENT PHYSICAL THERAPY PARKINSON'S TREATMENT   Patient Name: Vincent Walker MRN: [id number] DOB:November 04, 1945, 78 y.o., male Today's Date: 01/02/2024   END OF SESSION:  PT End of Session - 01/02/24 1404       Visit Number 5     Date for Recertification  02/20/24     Authorization Type Medicare & Federal BCBS     PT Start Time 1404     PT Stop Time 1448     PT Time Calculation (min) 44 min     Activity Tolerance Patient tolerated treatment well     Behavior During Therapy [facility] for tasks assessed/performed;Flat affect        Past Medical History:  Diagnosis Date   Adenomatous polyp of ascending colon 10/09/2018   Allergies 07/07/2018   Arthritis    Astigmatism of both eyes with presbyopia 08/21/2022   Blepharitis of upper and lower eyelids of both eyes 01/10/2016   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 Required nasal packing a couple of days ago.  Had a similar episode of epistaxis a little over a year ago.  At that visit, I could not see the exact bleeding spot. EXAMINATION after packing removal reveals several excoriated areas anteriorly but I was unable to see t   Essential hypertension 12/25/2011   Eustachian tube dysfunction, left 02/21/2021   Flank pain 07/19/2022   History of blood transfusion 2010   After Kidney surgery   History of chicken pox    History of renal cell carcinoma 02/08/2015   diagnosed and removed in 2010 Right Monitored by Dr Tanda Moats of urology at [facility] Dr Claudine Alley, nephrology at [facility]   Hyperglycemia 12/23/2015   Increased thyroid  stimulating hormone (TSH) level 06/07/2017   Ingrown left big toenail 02/14/2015   Left foot pain  02/14/2015   MVP (mitral valve prolapse) 05/14/2016   Parkinson's disease (HCC) 12/23/2015   Posterior vitreous detachment of both eyes 08/21/2022   Primary open-angle glaucoma, left eye, severe stage 05/05/2021   Primary open-angle glaucoma, right eye, moderate stage 01/10/2016   Pseudophakia, both eyes 08/21/2022   Right hip pain 12/12/2016   Stage 3 chronic kidney disease 12/25/2011   Thrombocytopenia 02/08/2015   Tremor 02/14/2015   Past Surgical History:  Procedure Laterality Date   APPENDECTOMY  1995   CATARACT EXTRACTION, BILATERAL     COLONOSCOPY     COLONOSCOPY WITH PROPOFOL  N/A 12/17/2018   Procedure: COLONOSCOPY WITH PROPOFOL ;  Surgeon: Wilhelmenia Aloha Raddle., MD;  Location: [facility] ENDOSCOPY;  Service: Gastroenterology;  Laterality: N/A;   ENDOSCOPIC MUCOSAL RESECTION N/A 12/17/2018   Procedure: ENDOSCOPIC MUCOSAL RESECTION;  Surgeon: Wilhelmenia Aloha Raddle., MD;  Location: [facility] ENDOSCOPY;  Service: Gastroenterology;  Laterality: N/A;   HEMOSTASIS CLIP PLACEMENT  12/17/2018   Procedure: HEMOSTASIS CLIP PLACEMENT;  Surgeon: Wilhelmenia Aloha Raddle., MD;  Location: [facility] ENDOSCOPY;  Service: Gastroenterology;;   left knee scope  2003   POLYPECTOMY  12/17/2018   Procedure: POLYPECTOMY;  Surgeon: Wilhelmenia Aloha Raddle., MD;  Location: [facility] ENDOSCOPY;  Service: Gastroenterology;;   right knee scope  1999   SUBMUCOSAL LIFTING INJECTION  12/17/2018   Procedure: SUBMUCOSAL LIFTING INJECTION;  Surgeon: Wilhelmenia Aloha Raddle., MD;  Location: [facility] ENDOSCOPY;  Service: Gastroenterology;;   TOE SURGERY Left    metal 2nd toe- and top of foot- straighten bone   TONSILLECTOMY     TOTAL KNEE ARTHROPLASTY Left 07/06/2014   TOTAL NEPHRECTOMY Right    Patient Active Problem List   Diagnosis Date Noted   Cellulitis of left foot 09/17/2023   Dementia due to Parkinson's disease 11/20/2022   Dry eye syndrome of both eyes 08/28/2022   Astigmatism of both eyes with presbyopia 08/21/2022   Choroidal  nevus of right eye 08/21/2022   Posterior vitreous detachment of both eyes 08/21/2022   Pseudophakia, both eyes 08/21/2022   Primary open-angle glaucoma, left eye, severe stage 05/05/2021   Eustachian tube dysfunction, left 02/21/2021   Early dry stage nonexudative age-related macular degeneration of both eyes 03/09/2019   Adenomatous polyp of ascending colon 10/09/2018   Epistaxis 07/07/2018   Allergies 07/07/2018   Obesity 06/15/2018   Increased thyroid  stimulating hormone (TSH) level 06/07/2017   MVP (mitral valve prolapse) 05/14/2016   Blepharitis of upper and lower eyelids of both eyes 01/10/2016   Primary open-angle glaucoma, right eye, moderate stage 01/10/2016   Parkinson's disease (HCC) 12/23/2015   Hyperglycemia 12/23/2015   Tremor 02/14/2015   Dyslipidemia 02/08/2015   History of renal cell carcinoma 02/08/2015   Thrombocytopenia 02/08/2015   History of chicken pox    Essential hypertension 12/25/2011   Stage 3 chronic kidney disease 12/25/2011    PCP: Domenica Harlene LABOR, MD   REFERRING PROVIDER: Evonnie Asberry RAMAN, DO   REFERRING DIAG: G20.A2 (ICD-10-CM) - Parkinson's disease without dyskinesia, with fluctuating manifestations (HCC)   THERAPY DIAG:  No diagnosis found.  RATIONALE FOR EVALUATION AND TREATMENT: Rehabilitation  ONSET DATE: PD diagnosis 5+ years ago   NEXT MD VISIT: 04/14/24   SUBJECTIVE:                                                                                                                                                                                                         SUBJECTIVE STATEMENT: Patient some soreness and stiffness in his knees after last visit which typically goes away within a day or two.  He reports more of an issue with freezing yesterday and would like to go back over the tips/techniques to reduce freezing today.   EVAL:  Pt reports worsening of his Parkinson's disease. His biggest concern is that he is having a harder  time walking and and to switch to a 2WW/RW from his cane.  He states his wife also notes more forgetfulness/memory issues. He recognizes increased freezing of  gait, typcially worse in the mornings and lessening as the day progresses.  He is worried about falling and injuring himself to the point where he will need someone to take care of him.  Pt accompanied by: significant other - wife [facility]) in waiting room   PAIN: Are you having pain? 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 Yes. Number of falls 2 (both while using RW to go around a corner)  LIVING ENVIRONMENT: Lives with: lives with their spouse Lives in: House/apartment Stairs: No Has following equipment at home: Single point cane, Environmental consultant - 2 wheeled, Grab bars, and Recumbent Bike  OCCUPATION: Retired  PLOF: Independent with household mobility with device, Independent with community mobility with device, Needs assistance with homemaking, and Leisure: mostly sedentary recently     PATIENT GOALS: To get myself going to help bring back some of what I've lost.   OBJECTIVE: (objective measures completed at initial evaluation unless otherwise dated)  DIAGNOSTIC FINDINGS:  09/15/23 - CT CERVICAL SPINE WITHOUT CONTRAST (s/p fall, hit right-side) IMPRESSION: 1. No acute cervical spine fracture. 2. Extensive multilevel cervical degenerative changes.  09/15/23 - CT HEAD WITHOUT CONTRAST (s/p fall, hit right-side) IMPRESSION: 1. No acute intracranial process.  09/15/23 - RIGHT SHOULDER - 2+ VIEW (s/p fall, hit right-side) IMPRESSION: 1. No acute fracture. 2. Bone hypertrophy likely from remote trauma to the  distal clavicle. Heterotopic bone formation likely from remote trauma to the coracoclavicular ligament. 3. Mild high-riding humeral head which may be secondary to a superior rotator cuff tear. 4. Mild glenohumeral osteoarthritis.  09/15/23 - LEFT FOOT - COMPLETE 3+ VIEW (L foot pain) IMPRESSION: 1. No significant change from prior.  No acute fracture. 2. Mild hallux valgus. 3. Postsurgical changes of second and third PIP arthrodesis.  09/29/23 - MR FOOT LEFT W WO CONTRAST FINDINGS: Postoperative change with metal artifact to the distal second, third, and fourth digits. No fracture or dislocation. No erosions. Mild second tarsometatarsal joint osteoarthritis with joint space narrowing and mild degenerative edema. The visualized marrow signal is otherwise unremarkable.   No obvious soft tissue wound/ulceration. This may be obscured by metal artifact. Moderate diffuse muscle atrophy. Mild likely reactive myositis. The tendons are unremarkable. Moderate to severe dorsal subcutaneous edema. No organized fluid collection.   IMPRESSION: The exam is slightly limited by metal artifact.   Moderate to severe dorsal subcutaneous edema. Correlate for lymphedema versus cellulitis.   No obvious soft tissue wound/ulceration or evidence of osteomyelitis. If continued clinical concern, CT scan and/or bone scan could be performed to further characterize. This would limit the metal artifact.   Diffuse muscle atrophy.   Mild degenerative change.  COGNITION: Overall cognitive status: Impaired - decreased short-term memory and recall of new information    SENSATION: WFL  COORDINATION: B heel-toe mildly diminished. Impaired heel to shin bilaterally.  MUSCLE TONE: Increased LE muscle tone noted during attempts at PROM/flexibility assessment.   POSTURE:  rounded shoulders, forward head, increased thoracic kyphosis, and flexed trunk   MUSCLE LENGTH: Hamstrings: mod tight B ITB: mild tight  B Piriformis: mod/severe tight B Hip flexors: mod/severe tight B Quads: mild/mod tight B Heelcord: mild tight B  LOWER EXTREMITY ROM:  Mildly limited B hip and ankle ROM (R>L), mostly due to limited muscle flexibility as above.   LOWER EXTREMITY MMT:    MMT Right eval Left eval  Hip flexion 4+ 4+  Hip extension 4- 4  Hip abduction 4 4  Hip adduction 4+ 4+  Hip internal rotation 5 5  Hip external rotation 4+ 4+  Knee flexion 4+ 4+  Knee extension 4+ 4+  Ankle dorsiflexion 4+ 4+  Ankle plantarflexion    Ankle inversion    Ankle eversion    (Blank rows = not tested)  BED MOBILITY:  Minimal assist overall  TRANSFERS: Assistive device utilized: Environmental consultant - 2 wheeled  Sit to stand: Modified independence and SBA - cues to push up from chair rather than pull up on walker Stand to sit: Modified independence Chair to chair: NT Floor: NT  GAIT: Distance walked: Clinic distances Assistive device utilized: Environmental consultant - 2 wheeled Level of assistance: Modified independence Gait pattern: step through pattern, decreased stride length, shuffling, festinating, and trunk flexed Comments: Patient reports intermittent freezing episodes, most common in the morning and subsiding as the day progresses  FUNCTIONAL TESTS:  5 times sit to stand: 12/13/23 23 seconds back on heels Timed up and go (TUG): 12/13/23  no device 23 seconds lost balance with turn Manual =  Cognitive =  10 meter walk test: 23.25 sec with 2WW Gait Speed: 1.41 ft/sec w/o AD, limited community ambulator; gait speed of <1.8 ft/sec indicates the risk for recurrent falls  Berg Balance Scale: 12/13/23 = 41/56 Functional gait assessment: TBA Interpretation of FGA scores: Non-Specific Older Adults Cutoff Score: <=22/30 = risk of falls Parkinson's Disease Cutoff score <15/30= fall risk (Hoehn & Yahr 1-4)  Minimally Clinically Important Difference (MCID)  Stroke (acute, subacute, and chronic) = MDC: 4.2 points Vestibular (acute) =  MDC: 6 points Community Dwelling Older Adults =  MCID: 4 points Parkinson's Disease  =  MDC: 4.3 points  (Academy of Neurologic Physical Therapy (nd). Functional Gait Assessment. Retrieved from https://www.neuropt.org/docs/default-source/cpgs/core-outcome-measures/function-gait-assessment-pocket-guide-proof9-(2).pdf?sfvrsn=b81f35043_0.)  Baseline as of D/C from last PT episode (11/20/22): 11/15/22: 5xSTS = 13.88 sec w/o UE assist (occasional hands on knees) TUG:  Normal = 9.93 sec Manual = 10.19 sec Cognitive = 13.16 sec (unable to complete cognitive task) : w/o AD = 10.62 sec, 9.32 sec (fastest comfortable speed w/o AD) with SPC = 11.59 sec with B hiking poles = 11.62 sec Gait speed: 3.09 ft/sec w/o AD  3.52 ft/sec (fastest comfortable speed w/o AD) 2.83 ft/sec with SPC  2.82 ft/sec with B hiking poles    11/20/22: Berg = 50/56, 46-51 moderate risk for falls (>50%)  FGA = 21/30, 19-24 = medium risk for fall   PATIENT SURVEYS:  ABC scale: The Activities-Specific Balance Confidence (ABC) Scale 0% 10 20 30  40 50 60 70 80 90 100% No confidence<->completely confident  "How confident are you that you will not lose your balance or become unsteady when you . . .   Date tested 11/28/23 *confidence with RW  Walk around the house 80%  2. Walk up or down stairs 50%  3. Bend over and pick up a slipper from in front of a closet floor 70%  4. Reach for a small can off a shelf at eye level 100%  5. Stand on tip toes and reach for something above your head 60%  6. Stand on a chair and reach for something 0%  7. Sweep the floor 0%  8. Walk outside the house to a car  parked in the driveway 80%  9. Get into or out of a car 90%  10. Walk across a parking lot to the mall 70%  11. Walk up or down a ramp 70%  12. Walk in a crowded mall where people rapidly walk past you 60%  13. Are bumped into by people as you walk through the mall 70%  14. Step onto or off of an escalator while you are  holding onto the railing 80%  15. Step onto or off an escalator while holding onto parcels such that you cannot hold onto the railing 0%  16. Walk outside on icy sidewalks 0%  Total: #/16 880 / 1600 = 55.0 %  50-80% = moderate level of physical functioning <69% indicates risk for recurrent falls in PD   TODAY'S TREATMENT:   01/02/2024  THERAPEUTIC EXERCISE: To improve strength and endurance.   NuStep - L6 x 6 min - UE/LE to promote reciprocal movement patterns  THERAPEUTIC ACTIVITIES: To improve functional performance.  Demonstration, verbal and tactile cues throughout for technique. Reviewed education on tips to prevent freezing with transfers and gait - simulated approach from foyer into den navigating around furniture to his usual chair where he was experiencing the freezing yesterday.    NEUROMUSCULAR RE-EDUCATION: To improve coordination, kinesthesia, posture, proprioception, amplitude of movement, speed of movement to reduce bradykinesia, and reduce rigidity. Supine PWR! Moves: Up x 10 Rock x 10 Twist x 10 Step x 10    12/30/2023  THERAPEUTIC EXERCISE: To improve strength and endurance.    Rec Bike - L3 x 6 min  SELF CARE: Provided education to reduce fall risk and to promote safe home environment. Reviewed the Check for Safety - Home Fall Prevention Checklist for Older Adults to help identify fall risk hazards in the home along with strategies to reduce fall risk at home  Reviewed education on tips to prevent freezing with transfers and gait.    NEUROMUSCULAR RE-EDUCATION: To improve balance, coordination, kinesthesia, posture, proprioception, reduce fall risk, amplitude of movement, speed of movement to reduce bradykinesia, and reduce rigidity. Seated PWR! Moves: Up x 10 Rock x 10 Twist x 10 Step x 10 PWR! Sit to stand x 10 Standing PWR! Moves: (standing in front on mat table with chair in front for intermittent UE support as needed) Up x 10 Rock x 10 Twist x  10 Step x 10    12/26/2023 THERAPEUTIC EXERCISE: To improve strength, endurance, and flexibility.  Demonstration, verbal and tactile cues throughout for technique.  NuStep - L6 x 6 min - UE/LE to promote reciprocal movement patterns Seated hip hinge HS stretch x 30 bil Seated lunge position hip flexor/quad stretch x 30 bil Seated GTB HS curls x 10 bil Seated GTB LAQ x 10 bil  MANUAL THERAPY: To promote normalized muscle tension, improved flexibility, pain modulation, and reduced pain utilizing connective tissue massage, therapeutic massage, and myofascial release. Foam roller to B quads x ~2 min Roller stick to R/L hamstrings x 1-2 min  NEUROMUSCULAR RE-EDUCATION: To improve balance, coordination, kinesthesia, posture, proprioception, reduce fall risk, and amplitude of movement. Standing 4-way GTB SLR with RW support for balance x 10   12/23/2023  THERAPEUTIC EXERCISE: To improve strength and endurance.  Demonstration, verbal and tactile cues throughout for technique.  NuStep - L6 x 6 min Seated GTB B scap retraction + shoulder row x 10  Seated GTB B scap retraction + shoulder extension x 10  Seated bent over scap retraction + B  shoulder extension x 10 with 5# db bilaterally Seated GTB hip ABD/ER clam 2 x 10 Seated GTB hip flexion march x 10  NEUROMUSCULAR RE-EDUCATION: To improve balance, coordination, kinesthesia, posture, proprioception, reduce fall risk, amplitude of movement, speed of movement to reduce bradykinesia, and reduce rigidity. Seated PWR! Moves: Up x 10 Rock x 10 Twist x 10 Step x 10 Standing hip extension with looped GTB at ankles x 10, UE support on RW (encouraged counter support at home) Standing hip abduction with looped GTB at ankles x 10, UE support on RW  THERAPEUTIC ACTIVITIES: To improve functional performance.  Demonstration, verbal and tactile cues throughout for technique. Sit to stand from Airex pad in chair w/o UE assist + GTB hip ABD isometric at  distal thighs x 10   12/19/23 Nustep L5x20min UE/LE Seated Lumbar flexion stretch green pball x 30' 3 way Seated hamstring stretch x 30' BLE Seated figure 4 stretch x 30 BLE Seated side bend stretch x 30' B Standing runner stretch x 30' Seated thoracic ext HBH 5x10 Standing pallof press GTB doubled x 10  Church pew weight shifts x 10   12/13/23 Nustep level 5 x 6 minutes TUG =  23 seconds, unsteady with turn no device but did touch wall 5XSTS = 23 seconds  really back on heels Berg = 41/56 On airex standing On airex ball toss, had to have another person for CGA tended to lose balance to the back Step over side to sided and forward back PVC T CGA Direction changes   11/19/2023  SELF CARE:  Reviewed eval findings and role of PT in addressing identified deficits as well as need for further assessment of balance.  Provided education on safe hand placement with RW during transfers to reduce fall risk.   PATIENT EDUCATION:  Education details: PWR! Moves - supine and tips to reduce freezing with transfers and gait  Person educated: Patient Education method: Explanation, Demonstration, Verbal cues, and Handouts Education comprehension: verbalized understanding, returned demonstration, verbal cues required, and needs further education  HOME EXERCISE PROGRAM: Access Code: [email or web address] URL: [email or web address] Date: 12/30/2023 Prepared by: Elijah Hidden  Exercises - Supine Lower Trunk Rotation  - 2 x daily - 7 x weekly - 2 sets - 10 reps - 10 sec hold - Seated Thoracic Lumbar Extension with Pectoralis Stretch  - 2 x daily - 7 x weekly - 2 sets - 10 reps - 5 sec hold - Seated Hamstring Stretch  - 2 x daily - 7 x weekly - 3 reps - 30 sec hold - Seated Figure 4 Piriformis Stretch  - 2 x daily - 7 x weekly - 3 reps - 30 sec hold - Standing Gastroc Stretch at Counter  - 2 x daily - 7 x weekly - 3 reps - 30 sec hold - Seated Quadratus Lumborum Stretch in Chair  - 2 x daily -  7 x weekly - 3 reps - 30 sec hold - Seated Flexion Stretch with Swiss Ball  - 2 x daily - 7 x weekly - 3 reps - 30 sec hold - Seated Thoracic Flexion and Rotation with Swiss Ball  - 2 x daily - 7 x weekly - 3 reps - 30 sec hold - Seated Bent Over Shoulder Row with Dumbbells  - 1 x daily - 3 x weekly - 2 sets - 10 reps - 3 sec hold - Seated Shoulder Extension with Dumbbells  - 1 x daily - 3 x weekly - 2 sets -  10 reps - 3 sec hold - Standing Hip Extension with Resistance at Ankles and Counter Support  - 1 x daily - 3 x weekly - 2 sets - 10 reps - 3 sec hold - Standing Hip Abduction with Resistance at Ankles and Counter Support  - 1 x daily - 3 x weekly - 2 sets - 10 reps - 3 sec hold  Patient Education - Tips to reduce freezing episodes with standing or walking - Check for Safety  PWR! Moves: - Sitting - Standing - Supine   ASSESSMENT:  CLINICAL IMPRESSION: Kavan reports increased freezing episodes yesterday and requested to review the tips to reduce freezing of mobility and gait - simulated home navigation in places where he is more likely to experiencing freezing providing VCs and guidance on how and where to incorporate the strategies.  Introduced supine PWR! Moves today with frequent cues necessary to coordinate sequencing of movement, esp with PWR! Step - handout provided for home but will need further instruction in upcoming visits.  Braxton will benefit from continued skilled PT to address ongoing postural, strength and balance  deficits to improve mobility and activity tolerance with decreased pain interference and decreased risk for falls.   EVAL: DEVON PRETTY is a 78 y.o. male who was referred to physical therapy for evaluation and treatment for Parkinson's disease.  Patient first diagnosed with Parkinson's 5+ years ago.  He has completed 2 prior PT episodes in 2022/2023 and 2024 within the [facility] system.  Most recent PT episode for Parkinson's disease was 09/25/2022 -  11/20/2022.  Since his last PT episode, he reports worsening of his PD leading to increased gait impairments and need to switch from single-point cane to RW/2WW.  Patient presents with physical impairments of decreased timing and coordination of gait, impaired ambulation, impaired standing balance, abnormal posture, bradykinesia with transfers, impaired activity tolerance, LE weakness, postural instability and decreased safety awareness impacting safe and independent functional mobility.  Examination revealed patient is at risk for falls and functional decline as evidenced by the following objective test measures: Gait speed 1.41 ft/sec, (2.62 ft/sec is needed for community access and <1.8 ft/sec is indicative of risk for recurrent falls), which demonstrates a significant decline from gait speed of.  He reports increased incidence of freezing of gait, most common in the morning and subsiding as the day progresses.  He has had 2 falls in the past 6 months, both of which occurred while turning with the RW.  Further balance testing with 5xSTS, TUG, Berg and/or DGI/FGA indicated however patient needing to use the bathroom preventing testing today.  ABC scale score of 55% indicates a moderate level of physical functioning, and score of <69% indicates risk for recurrent falls in PD.  Shlomo will benefit from skilled PT to address above deficits to improve mobility and activity tolerance to help reach the maximal level of functional independence with mobility and gait with reduced risk for falls.  Patient and his wife demonstrates understanding of this POC and are in agreement with this plan.   OBJECTIVE IMPAIRMENTS: Abnormal gait, decreased activity tolerance, decreased balance, decreased coordination, decreased knowledge of condition, decreased knowledge of use of DME, decreased mobility, difficulty walking, decreased ROM, decreased strength, decreased safety awareness, increased fascial restrictions, impaired perceived  functional ability, impaired flexibility, improper body mechanics, postural dysfunction, and pain.   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   REHAB POTENTIAL: Good  CLINICAL DECISION MAKING: Unstable/unpredictable  EVALUATION COMPLEXITY: High   GOALS: Goals reviewed with patient? Yes  SHORT TERM GOALS: Target date: 01/09/2024  Patient will be independent with initial HEP. Baseline: TBD Goal status: IN PROGRESS - 12/23/23 - Pt reports no concerns with current HEP exercises  2.  Patient will demonstrate decreased fall risk by scoring < 25 sec on TUG. Baseline: TBA Goal status: MET - 12/13/23 - 23 seconds  3.  Patient will be educated on strategies to decrease risk of falls.  Baseline:  Goal status: MET - 12/30/23  4.  Patient will verbalize tips to reduce freezing/festination with gait and turns. Baseline: Patient reports freezing episodes most common in the morning, subsiding as the day progresses. 12/30/23 - reviewed today Goal status: IN PROGRESS - 01/02/24 - simulated home navigation in places where he is more likely to experiencing freezing   LONG TERM GOALS: Target date: 02/20/2024  Patient will be independent with ongoing/advanced HEP for self-management at home incorporating PWR! Moves as indicated .  Baseline:  Goal status: INITIAL  2.  Patient will be able to ambulate 600' with LRAD with good  safety to access community.  Baseline:  Goal status: INITIAL  3.  Patient will be able to step up/down curb safely with LRAD for safety with community ambulation.  Baseline:  Goal status: INITIAL   4.  Patient will demonstrate gait speed of >/= 1.8 ft/sec (0.55 m/s) to be a safe limited community ambulator with decreased risk for recurrent falls.  Baseline: 1.41 ft/sec with RW Goal status: INITIAL  5.  Patient will improve 5x STS time to </= 16 seconds to demonstrate improved functional strength and transfer efficiency. Baseline: TBA Goal status: INITIAL  6.  Patient will demonstrate at least 19/24 on DGI or 19/30 on FGA to improve gait stability and reduce risk for falls. Baseline: TBA Goal status: INITIAL  7.  Patient will improve Berg score by at least 8 points to improve safety and stability with ADLs in standing and reduce risk for falls.   Baseline: TBA Goal status: INITIAL  8.  Patient will report >/= 69% on ABC scale to demonstrate improved balance confidence and decreased risk for falls. Baseline: 880 / 1600 = 55.0 % Goal status: INITIAL  9. Patient will verbalize understanding of local Parkinson's disease community resources, including community fitness post d/c. Baseline:  Goal status: INITIAL   PLAN:  PT FREQUENCY: 2x/week  PT DURATION: 12 weeks  PLANNED INTERVENTIONS: 97164- PT Re-evaluation, 97750- Physical Performance Testing, 97110-Therapeutic exercises, 97530- Therapeutic activity, [id number]- Neuromuscular re-education, 97535- Self Care, [id number]- Manual therapy, [phone]- Gait training, [phone]- Electrical stimulation (unattended), 97035- Ultrasound, 02966- Ionotophoresis 4mg /ml Dexamethasone , 79439 (1-2 muscles), [id number] (3+ muscles)- Dry Needling, Patient/Family education, Balance training, Stair training, Taping, Joint mobilization, Spinal mobilization, Cryotherapy, and Moist heat  PLAN FOR NEXT SESSION: Review education on tips to reduce freezing as indicated; LE  flexibility; core/postural strengthening; review seated, standing and supine PWR! Moves PRN and re-introduce remaining positions as tolerated   Elijah CHRISTELLA Hidden, PT 01/02/2024, 2:04 PM

## 2024-01-06 ENCOUNTER — Ambulatory Visit: Admitting: Physical Therapy

## 2024-01-06 ENCOUNTER — Encounter: Payer: Self-pay | Admitting: Physical Therapy

## 2024-01-06 DIAGNOSIS — M6281 Muscle weakness (generalized): Secondary | ICD-10-CM

## 2024-01-06 DIAGNOSIS — R296 Repeated falls: Secondary | ICD-10-CM | POA: Diagnosis not present

## 2024-01-06 DIAGNOSIS — R2689 Other abnormalities of gait and mobility: Secondary | ICD-10-CM | POA: Diagnosis not present

## 2024-01-06 DIAGNOSIS — R2681 Unsteadiness on feet: Secondary | ICD-10-CM

## 2024-01-06 DIAGNOSIS — G20A2 Parkinson's disease without dyskinesia, with fluctuations: Secondary | ICD-10-CM

## 2024-01-06 NOTE — Therapy (Signed)
 OUTPATIENT PHYSICAL THERAPY PARKINSON'S TREATMENT   Patient Name: Vincent Walker MRN: [id number] DOB:1946/02/14, 78 y.o., male Today's Date: 01/06/2024   END OF SESSION:  PT End of Session - 01/06/24 1619     Visit Number 8    Date for Recertification  02/20/24    Authorization Type Medicare & Federal BCBS    PT Start Time 1619    PT Stop Time 1651   Pt requesting to leave early   PT Time Calculation (min) 32 min    Activity Tolerance Patient tolerated treatment well    Behavior During Therapy [facility] for tasks assessed/performed;Flat affect             Past Medical History:  Diagnosis Date   Adenomatous polyp of ascending colon 10/09/2018   Allergies 07/07/2018   Arthritis    Astigmatism of both eyes with presbyopia 08/21/2022   Blepharitis of upper and lower eyelids of both eyes 01/10/2016   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 Required nasal packing a couple of days ago.  Had a similar episode of epistaxis a little over a year ago.  At that visit, I could not see the exact bleeding spot. EXAMINATION after packing removal reveals several excoriated areas anteriorly but I was unable to see t   Essential hypertension 12/25/2011   Eustachian tube dysfunction, left 02/21/2021   Flank pain 07/19/2022   History of blood transfusion 2010   After Kidney surgery   History of chicken pox    History of renal cell carcinoma 02/08/2015   diagnosed and removed in 2010 Right Monitored by Dr Tanda Moats of urology at [facility] Dr Claudine Alley, nephrology at [facility]   Hyperglycemia 12/23/2015   Increased thyroid  stimulating hormone (TSH) level 06/07/2017   Ingrown left big toenail  02/14/2015   Left foot pain 02/14/2015   MVP (mitral valve prolapse) 05/14/2016   Parkinson's disease (HCC) 12/23/2015   Posterior vitreous detachment of both eyes 08/21/2022   Primary open-angle glaucoma, left eye, severe stage 05/05/2021   Primary open-angle glaucoma, right eye, moderate stage 01/10/2016   Pseudophakia, both eyes 08/21/2022   Right hip pain 12/12/2016   Stage 3 chronic kidney disease 12/25/2011   Thrombocytopenia 02/08/2015   Tremor 02/14/2015   Past Surgical History:  Procedure Laterality Date   APPENDECTOMY  1995   CATARACT EXTRACTION, BILATERAL     COLONOSCOPY     COLONOSCOPY WITH PROPOFOL  N/A 12/17/2018   Procedure: COLONOSCOPY WITH PROPOFOL ;  Surgeon: Wilhelmenia Aloha Raddle., MD;  Location[facility] ENDOSCOPY;  Service: Gastroenterology;  Laterality: N/A;   ENDOSCOPIC MUCOSAL RESECTION N/A 12/17/2018   Procedure: ENDOSCOPIC MUCOSAL RESECTION;  Surgeon: Wilhelmenia Aloha Raddle., MD;  Location: [facility] ENDOSCOPY;  Service: Gastroenterology;  Laterality: N/A;   HEMOSTASIS CLIP PLACEMENT  12/17/2018   Procedure: HEMOSTASIS CLIP PLACEMENT;  Surgeon: Wilhelmenia Aloha Raddle., MD;  Location: [facility] ENDOSCOPY;  Service: Gastroenterology;;   left knee scope  2003   POLYPECTOMY  12/17/2018   Procedure: POLYPECTOMY;  Surgeon: Wilhelmenia Aloha Raddle., MD;  Location: [facility] ENDOSCOPY;  Service: Gastroenterology;;   right knee scope  1999   SUBMUCOSAL LIFTING INJECTION  12/17/2018   Procedure: SUBMUCOSAL LIFTING INJECTION;  Surgeon: Wilhelmenia Aloha Raddle., MD;  Location: [facility]  ENDOSCOPY;  Service: Gastroenterology;;   TOE SURGERY Left    metal 2nd toe- and top of foot- straighten bone   TONSILLECTOMY     TOTAL KNEE ARTHROPLASTY Left 07/06/2014   TOTAL NEPHRECTOMY Right    Patient Active Problem List   Diagnosis Date Noted   Cellulitis of left foot 09/17/2023   Dementia due to Parkinson's disease 11/20/2022   Dry eye syndrome of both eyes 08/28/2022   Astigmatism of both eyes with  presbyopia 08/21/2022   Choroidal nevus of right eye 08/21/2022   Posterior vitreous detachment of both eyes 08/21/2022   Pseudophakia, both eyes 08/21/2022   Primary open-angle glaucoma, left eye, severe stage 05/05/2021   Eustachian tube dysfunction, left 02/21/2021   Early dry stage nonexudative age-related macular degeneration of both eyes 03/09/2019   Adenomatous polyp of ascending colon 10/09/2018   Epistaxis 07/07/2018   Allergies 07/07/2018   Obesity 06/15/2018   Increased thyroid  stimulating hormone (TSH) level 06/07/2017   MVP (mitral valve prolapse) 05/14/2016   Blepharitis of upper and lower eyelids of both eyes 01/10/2016   Primary open-angle glaucoma, right eye, moderate stage 01/10/2016   Parkinson's disease (HCC) 12/23/2015   Hyperglycemia 12/23/2015   Tremor 02/14/2015   Dyslipidemia 02/08/2015   History of renal cell carcinoma 02/08/2015   Thrombocytopenia 02/08/2015   History of chicken pox    Essential hypertension 12/25/2011   Stage 3 chronic kidney disease 12/25/2011    PCP: Domenica Harlene LABOR, MD   REFERRING PROVIDER: Evonnie Asberry RAMAN, DO   REFERRING DIAG: G20.A2 (ICD-10-CM) - Parkinson's disease without dyskinesia, with fluctuating manifestations (HCC)   THERAPY DIAG:  No diagnosis found.  RATIONALE FOR EVALUATION AND TREATMENT: Rehabilitation  ONSET DATE: PD diagnosis 5+ years ago   NEXT MD VISIT: 04/14/24   SUBJECTIVE:                                                                                                                                                                                                         SUBJECTIVE STATEMENT: Patient states it's a Monday.  No specific concerns or complaints.   EVAL:  Pt reports worsening of his Parkinson's disease. His biggest concern is that he is having a harder time walking and and to switch to a 2WW/RW from his cane.  He states his wife also notes more forgetfulness/memory issues. He recognizes  increased freezing of gait, typcially worse in the mornings and lessening as the day progresses.  He is worried about falling and injuring himself to the point where he will need someone to  take care of him.  Pt accompanied by: significant other - wife Vincent Walker, Vincent Walker) in waiting room   PAIN: Are you having pain? 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 Yes. Number of falls 2 (both while using RW to go around a corner)  LIVING ENVIRONMENT: Lives with: lives with their spouse Lives in: House/apartment Stairs: No Has following equipment at home: Single point cane, Environmental consultant - 2 wheeled, Grab bars, and Recumbent Bike  OCCUPATION: Retired  PLOF: Independent with household mobility with device, Independent with community mobility with device, Needs assistance with homemaking, and Leisure: mostly sedentary recently     PATIENT GOALS: To get myself going to help bring back some of what I've lost.   OBJECTIVE: (objective measures completed at initial evaluation unless otherwise dated)  DIAGNOSTIC FINDINGS:  09/15/23 - CT CERVICAL SPINE WITHOUT CONTRAST (s/p fall, hit right-side) IMPRESSION: 1. No acute cervical spine fracture. 2. Extensive multilevel cervical degenerative changes.  09/15/23 - CT HEAD WITHOUT CONTRAST (s/p fall, hit right-side) IMPRESSION: 1. No acute intracranial process.  09/15/23 - RIGHT SHOULDER - 2+ VIEW (s/p fall, hit right-side) IMPRESSION: 1. No acute fracture. 2. Bone hypertrophy likely from remote trauma to the distal clavicle. Heterotopic bone formation likely from remote trauma to the coracoclavicular ligament. 3. Mild high-riding humeral head  which may be secondary to a superior rotator cuff tear. 4. Mild glenohumeral osteoarthritis.  09/15/23 - LEFT FOOT - COMPLETE 3+ VIEW (L foot pain) IMPRESSION: 1. No significant change from prior.  No acute fracture. 2. Mild hallux valgus. 3. Postsurgical changes of second and third PIP arthrodesis.  09/29/23 - MR FOOT LEFT W WO CONTRAST FINDINGS: Postoperative change with metal artifact to the distal second, third, and fourth digits. No fracture or dislocation. No erosions. Mild second tarsometatarsal joint osteoarthritis with joint space narrowing and mild degenerative edema. The visualized Walker signal is otherwise unremarkable.   No obvious soft tissue wound/ulceration. This may be obscured by metal artifact. Moderate diffuse muscle atrophy. Mild likely reactive myositis. The tendons are unremarkable. Moderate to severe dorsal subcutaneous edema. No organized fluid collection.   IMPRESSION: The exam is slightly limited by metal artifact.   Moderate to severe dorsal subcutaneous edema. Correlate for lymphedema versus cellulitis.   No obvious soft tissue wound/ulceration or evidence of osteomyelitis. If continued clinical concern, CT scan and/or bone scan could be performed to further characterize. This would limit the metal artifact.   Diffuse muscle atrophy.   Mild degenerative change.  COGNITION: Overall cognitive status: Impaired - decreased short-term memory and recall of new information    SENSATION: WFL  COORDINATION: B heel-toe mildly diminished. Impaired heel to shin bilaterally.  MUSCLE TONE: Increased LE muscle tone noted during attempts at PROM/flexibility assessment.   POSTURE:  rounded shoulders, forward head, increased thoracic kyphosis, and flexed trunk   MUSCLE LENGTH: Hamstrings: mod tight B ITB: mild tight B Piriformis: mod/severe tight B Hip flexors: mod/severe tight B Quads: mild/mod tight B Heelcord: mild tight B  LOWER EXTREMITY ROM:     Mildly limited B hip and ankle ROM (R>L), mostly due to limited muscle flexibility as above.   LOWER EXTREMITY MMT:    MMT Right eval  Left eval  Hip flexion 4+ 4+  Hip extension 4- 4  Hip abduction 4 4  Hip adduction 4+ 4+  Hip internal rotation 5 5  Hip external rotation 4+ 4+  Knee flexion 4+ 4+  Knee extension 4+ 4+  Ankle dorsiflexion 4+ 4+  Ankle plantarflexion    Ankle inversion    Ankle eversion    (Blank rows = not tested)  BED MOBILITY:  Minimal assist overall  TRANSFERS: Assistive device utilized: Environmental consultant - 2 wheeled  Sit to stand: Modified independence and SBA - cues to push up from chair rather than pull up on walker Stand to sit: Modified independence Chair to chair: NT Floor: NT  GAIT: Distance walked: Clinic distances Assistive device utilized: Environmental consultant - 2 wheeled Level of assistance: Modified independence Gait pattern: step through pattern, decreased stride length, shuffling, festinating, and trunk flexed Comments: Patient reports intermittent freezing episodes, most common in the morning and subsiding as the day progresses  FUNCTIONAL TESTS:  5 times sit to stand: 12/13/23 - 23 seconds back on heels Timed up and go (TUG): 12/13/23 - no device 23 seconds lost balance with turn Manual =  Cognitive =  10 meter walk test: 23.25 sec with 2WW Gait Speed: 1.41 ft/sec w/o AD, limited community ambulator; gait speed of <1.8 ft/sec indicates the risk for recurrent falls  Berg Balance Scale: 12/13/23 - 41/56 Functional gait assessment: TBA Interpretation of FGA scores: Non-Specific Older Adults Cutoff Score: <=22/30 = risk of falls Parkinson's Disease Cutoff score <15/30= fall risk (Hoehn & Yahr 1-4)  Minimally Clinically Important Difference (MCID)  Stroke (acute, subacute, and chronic) = MDC: 4.2 points Vestibular (acute) = MDC: 6 points Community Dwelling Older Adults =  MCID: 4 points Parkinson's Disease  =  MDC: 4.3 points  (Academy of Neurologic  Physical Therapy (nd). Functional Gait Assessment. Retrieved from https://www.neuropt.org/docs/default-source/cpgs/core-outcome-measures/function-gait-assessment-pocket-guide-proof9-(2).pdf?sfvrsn=b76f35043_0.)  Baseline as of D/C from last PT episode (11/20/22): 11/15/22: 5xSTS = 13.88 sec w/o UE assist (occasional hands on knees) TUG:  Normal = 9.93 sec Manual = 10.19 sec Cognitive = 13.16 sec (unable to complete cognitive task) : w/o AD = 10.62 sec, 9.32 sec (fastest comfortable speed w/o AD) with SPC = 11.59 sec with B hiking poles = 11.62 sec Gait speed: 3.09 ft/sec w/o AD  3.52 ft/sec (fastest comfortable speed w/o AD) 2.83 ft/sec with SPC  2.82 ft/sec with B hiking poles    11/20/22: Berg = 50/56, 46-51 moderate risk for falls (>50%)  FGA = 21/30, 19-24 = medium risk for fall   PATIENT SURVEYS:  ABC scale: The Activities-Specific Balance Confidence (ABC) Scale 0% 10 20 30  40 50 60 70 80 90 100% No confidence<->completely confident  "How confident are you that you will not lose your balance or become unsteady when you . . .  Date tested 11/28/23 *confidence with RW  Walk around the house 80%  2. Walk up or down stairs 50%  3. Bend over and pick up a slipper from in front of a closet floor 70%  4. Reach for a small can off a shelf at eye level 100%  5. Stand on tip toes and reach for something above your head 60%  6. Stand on a chair and reach for something 0%  7. Sweep the floor 0%  8. Walk outside the house to a car parked in the driveway 80%  9. Get into or out of a car 90%  10. Walk across a parking lot to the mall 70%  11. Walk up or down a ramp 70%  12. Walk in a crowded mall where people rapidly walk past you 60%  13. Are bumped into by people as you walk through the mall 70%  14. Step onto or off of an escalator while you are holding onto the railing 80%  15. Step onto or off an escalator while holding onto parcels such that you cannot hold onto the railing 0%   16. Walk outside on icy sidewalks 0%  Total: #/16 880 / 1600 = 55.0 %  50-80% = moderate level of physical functioning <69% indicates risk for recurrent falls in PD   TODAY'S TREATMENT:   01/06/2024  THERAPEUTIC EXERCISE: To improve strength and endurance.   NuStep - L6 x 6 min - UE/LE to promote reciprocal movement patterns  NEUROMUSCULAR RE-EDUCATION: To improve coordination, kinesthesia, posture, amplitude of movement, speed of movement to reduce bradykinesia, and reduce rigidity.  Prone PWR! Moves (2 pillows under abdomen/hips to reduce low back strain): Up x 10 Rock x 10 Twist x 10 bil Step x 10   01/02/2024  THERAPEUTIC EXERCISE: To improve strength and endurance.   NuStep - L6 x 6 min - UE/LE to promote reciprocal movement patterns  THERAPEUTIC ACTIVITIES: To improve functional performance.  Demonstration, verbal and tactile cues throughout for technique. Reviewed education on tips to prevent freezing with transfers and gait - simulated approach from foyer into den navigating around furniture to his usual chair where he was experiencing the freezing yesterday.    NEUROMUSCULAR RE-EDUCATION: To improve coordination, kinesthesia, posture, proprioception, amplitude of movement, speed of movement to reduce bradykinesia, and reduce rigidity. Supine PWR! Moves: Up x 10 Rock x 10 Twist x 10 Step x 10    12/30/2023  THERAPEUTIC EXERCISE: To improve strength and endurance.    Rec Bike - L3 x 6 min  SELF CARE: Provided education to reduce fall risk and to promote safe home environment. Reviewed the Check for Safety - Home Fall Prevention Checklist for Older Adults to help identify fall risk hazards in the home along with strategies to reduce fall risk at home  Reviewed education on tips to prevent freezing with transfers and gait.    NEUROMUSCULAR RE-EDUCATION: To improve balance, coordination, kinesthesia, posture, proprioception, reduce fall risk, amplitude of movement,  speed of movement to reduce bradykinesia, and reduce rigidity. Seated PWR! Moves: Up x 10 Rock x 10 Twist x 10 Step x 10 PWR! Sit to stand x 10 Standing PWR! Moves: (standing in front on mat table with chair in front for intermittent UE support as needed) Up x 10 Rock x 10 Twist x 10 Step x 10    12/26/2023 THERAPEUTIC EXERCISE: To improve strength, endurance, and flexibility.  Demonstration, verbal and tactile cues throughout for technique.  NuStep - L6 x 6 min - UE/LE to promote reciprocal movement patterns Seated hip hinge HS stretch x 30 bil Seated lunge position hip flexor/quad stretch x 30 bil Seated GTB HS curls x 10 bil Seated GTB LAQ x 10 bil  MANUAL THERAPY: To promote normalized muscle tension, improved flexibility, pain modulation, and reduced pain utilizing connective tissue massage, therapeutic massage, and myofascial release. Foam roller to B quads x ~2 min Roller stick to R/L hamstrings x 1-2 min  NEUROMUSCULAR RE-EDUCATION: To improve balance, coordination, kinesthesia, posture, proprioception, reduce fall risk, and amplitude of movement. Standing 4-way GTB SLR with RW support for balance x 10   12/23/2023  THERAPEUTIC EXERCISE: To improve strength and endurance.  Demonstration, verbal and tactile cues throughout for technique.  NuStep - L6 x 6 min Seated GTB B scap retraction + shoulder row x 10  Seated GTB B scap retraction + shoulder extension x 10  Seated bent over scap retraction + B shoulder extension x 10 with 5# db bilaterally Seated GTB hip ABD/ER clam 2 x 10 Seated GTB hip flexion march x 10  NEUROMUSCULAR RE-EDUCATION: To improve balance, coordination, kinesthesia, posture, proprioception, reduce fall risk, amplitude of movement, speed of movement to reduce bradykinesia, and reduce rigidity. Seated PWR! Moves: Up x 10 Rock x 10 Twist x 10 Step x 10 Standing hip extension with looped GTB at ankles x 10, UE support on RW (encouraged counter  support at home) Standing hip abduction with looped GTB at ankles x 10, UE support on RW  THERAPEUTIC ACTIVITIES: To improve functional performance.  Demonstration, verbal and tactile cues throughout for technique. Sit to stand from Airex pad in chair w/o UE assist + GTB hip ABD isometric at distal thighs x 10   12/19/23 Nustep L5x44min UE/LE Seated Lumbar flexion stretch green pball x 30' 3 way Seated hamstring stretch x 30' BLE Seated figure 4 stretch x 30 BLE Seated side bend stretch x 30' B Standing runner stretch x 30' Seated thoracic ext HBH 5x10 Standing pallof press GTB doubled x 10  Church pew weight shifts x 10   12/13/23 Nustep level 5 x 6 minutes TUG =  23 seconds, unsteady with turn no device but did touch wall 5XSTS = 23 seconds  really back on heels Berg = 41/56 On airex standing On airex ball toss, had to have another person for CGA tended to lose balance to the back Step over side to sided and forward back PVC T CGA Direction changes   11/19/2023  SELF CARE:  Reviewed eval findings and role of PT in addressing identified deficits as well as need for further assessment of balance.  Provided education on safe hand placement with RW during transfers to reduce fall risk.   PATIENT EDUCATION:  Education details: PWR! Moves - prone  Person educated: Patient Education method: Explanation, Demonstration, Verbal cues, and Handouts Education comprehension: verbalized understanding, returned demonstration, verbal cues required, and needs further education  HOME EXERCISE PROGRAM: Access Code: [email or web address] URL: [email or web address] Date: 12/30/2023 Prepared by: Elijah Hidden  Exercises - Supine Lower Trunk Rotation  - 2 x daily - 7 x weekly - 2 sets - 10 reps - 10 sec hold - Seated Thoracic Lumbar Extension with Pectoralis Stretch  - 2 x daily - 7 x weekly - 2 sets - 10 reps - 5 sec hold - Seated Hamstring Stretch  - 2 x daily - 7 x weekly - 3 reps - 30  sec hold - Seated Figure 4 Piriformis Stretch  - 2 x daily - 7 x weekly - 3 reps - 30 sec hold - Standing Gastroc Stretch at Counter  - 2 x daily - 7 x weekly - 3 reps - 30 sec hold - Seated Quadratus Lumborum Stretch in Chair  - 2 x daily - 7 x weekly - 3 reps - 30 sec hold - Seated Flexion Stretch with Swiss Ball  - 2 x daily - 7 x weekly - 3 reps - 30 sec hold - Seated Thoracic Flexion and Rotation with Swiss Ball  - 2 x daily - 7 x weekly - 3 reps - 30 sec hold - Seated Bent Over Shoulder Row with Dumbbells  - 1  x daily - 3 x weekly - 2 sets - 10 reps - 3 sec hold - Seated Shoulder Extension with Dumbbells  - 1 x daily - 3 x weekly - 2 sets - 10 reps - 3 sec hold - Standing Hip Extension with Resistance at Ankles and Counter Support  - 1 x daily - 3 x weekly - 2 sets - 10 reps - 3 sec hold - Standing Hip Abduction with Resistance at Ankles and Counter Support  - 1 x daily - 3 x weekly - 2 sets - 10 reps - 3 sec hold  Patient Education - Tips to reduce freezing episodes with standing or walking - Check for Safety  PWR! Moves: - Sitting - Standing - Supine - Prone   ASSESSMENT:  CLINICAL IMPRESSION: Vincent Walker denies need for review of previous PWR! Moves positions and requested to proceed with prone PWR! Moves today.  Introduced prone PWR! Moves today with limited tolerance for POE due to low back pain, therefore added 2 pillows under abdomen and hips to reduce low back strain with better tolerance reported.  Frequent VC & TC necessary to coordinate sequencing of movement, with PWR! Up completed sequentially rather than simultaneously, and repeated cues necessary for desire pattern with PWR! Rock and Step.  Difficulty also noted with repositioning into side-lying for PWR! Twist.  Prone PWR! Moves may not be feasible for home performance, but handout provided at patient request.  Pastor will benefit from continued skilled PT to address ongoing postural, strength and balance deficits to improve  mobility and activity tolerance with decreased pain interference and decreased risk for falls.   EVAL: Vincent Walker is a 78 y.o. male who was referred to physical therapy for evaluation and treatment for Parkinson's disease.  Patient first diagnosed with Parkinson's 5+ years ago.  He has completed 2 prior PT episodes in 2022/2023 and 2024 within the [facility] system.  Most recent PT episode for Parkinson's disease was 09/25/2022 - 11/20/2022.  Since his last PT episode, he reports worsening of his PD leading to increased gait impairments and need to switch from single-point cane to RW/2WW.  Patient presents with physical impairments of decreased timing and coordination of gait, impaired ambulation, impaired standing balance, abnormal posture, bradykinesia with transfers, impaired activity tolerance, LE weakness, postural instability and decreased safety awareness impacting safe and independent functional mobility.  Examination revealed patient is at risk for falls and functional decline as evidenced by the following objective test measures: Gait speed 1.41 ft/sec, (2.62 ft/sec is needed for community access and <1.8 ft/sec is indicative of risk for recurrent falls), which demonstrates a significant decline from gait speed of.  He reports increased incidence of freezing of gait, most common in the morning and subsiding as the day progresses.  He has had 2 falls in the past 6 months, both of which occurred while turning with the RW.  Further balance testing with 5xSTS, TUG, Berg and/or DGI/FGA indicated however patient needing to use the bathroom preventing testing today.  ABC scale score of 55% indicates a moderate level of physical functioning, and score of <69% indicates risk for recurrent falls in PD.  Vincent Walker will benefit from skilled PT to address above deficits to improve mobility and activity tolerance to help reach the maximal level of functional independence with mobility and gait with reduced risk  for falls.  Patient and his wife demonstrates understanding of this POC and are in agreement with this plan.   OBJECTIVE IMPAIRMENTS: Abnormal  gait, decreased activity tolerance, decreased balance, decreased coordination, decreased knowledge of condition, decreased knowledge of use of DME, decreased mobility, difficulty walking, decreased ROM, decreased strength, decreased safety awareness, increased fascial restrictions, impaired perceived functional ability, impaired flexibility, improper body mechanics, postural dysfunction, and pain.   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   REHAB POTENTIAL: Good  CLINICAL DECISION MAKING: Unstable/unpredictable  EVALUATION COMPLEXITY: High   GOALS: Goals reviewed with patient? Yes  SHORT TERM GOALS: Target date: 01/09/2024  Patient will be independent with initial HEP. Baseline: TBD Goal status: IN PROGRESS - 12/23/23 - Pt reports no concerns with current HEP exercises  2.  Patient will demonstrate decreased fall risk by scoring < 25 sec on TUG. Baseline: TBA Goal status: MET - 12/13/23 - 23 seconds  3.  Patient will be educated on strategies to decrease risk of falls.  Baseline:  Goal status: MET - 12/30/23  4.  Patient will verbalize tips to reduce freezing/festination with gait and turns. Baseline: Patient reports freezing episodes most common in the morning,  subsiding as the day progresses. 12/30/23 - reviewed today Goal status: IN PROGRESS - 01/02/24 - simulated home navigation in places where he is more likely to experiencing freezing   LONG TERM GOALS: Target date: 02/20/2024  Patient will be independent with ongoing/advanced HEP for self-management at home incorporating PWR! Moves as indicated .  Baseline:  Goal status: IN PROGRESS - 01/06/24  2.  Patient will be able to ambulate 600' with LRAD with good safety to access community.  Baseline:  Goal status: INITIAL  3.  Patient will be able to step up/down curb safely with LRAD for safety with community ambulation.  Baseline:  Goal status: INITIAL   4.  Patient will demonstrate gait speed of >/= 1.8 ft/sec (0.55 m/s) to be a safe limited community ambulator with decreased risk for recurrent falls.  Baseline: 1.41 ft/sec with RW Goal status: INITIAL  5.  Patient will improve 5x STS time to </= 16 seconds to demonstrate improved functional strength and transfer efficiency. Baseline: 12/13/23 - 23 seconds  Goal status: IN PROGRESS  6.  Patient will demonstrate at least 19/24 on DGI or 19/30 on FGA to improve gait stability and reduce risk for falls. Baseline: TBA Goal status: INITIAL  7.  Patient will improve Berg score by at least 8 points to improve safety and stability with ADLs in standing and reduce risk for falls.   Baseline: 12/13/23 - 41/56 Goal status: IN PROGRESS  8.  Patient will report >/= 69% on ABC scale to demonstrate improved balance confidence and decreased risk for falls. Baseline: 880 / 1600 = 55.0 % Goal status: INITIAL  9. Patient will verbalize understanding of local Parkinson's disease community resources, including community fitness post d/c. Baseline:  Goal status: INITIAL   PLAN:  PT FREQUENCY: 2x/week  PT DURATION: 12 weeks  PLANNED INTERVENTIONS: 97164- PT Re-evaluation, 97750- Physical Performance Testing, 97110-Therapeutic exercises, 97530-  Therapeutic activity, [id number]- Neuromuscular re-education, 97535- Self Care, 02859- Manual therapy, [id number]- Gait training, [phone]- Electrical stimulation (  unattended), [phone]- Ultrasound, 02966- Ionotophoresis 4mg /ml Dexamethasone , 20560 (1-2 muscles), 20561 (3+ muscles)- Dry Needling, Patient/Family education, Balance training, Stair training, Taping, Joint mobilization, Spinal mobilization, Cryotherapy, and Moist heat  PLAN FOR NEXT SESSION: Standardized testing reassessment in prep for 10th visit PN, starting with FGA; review education on tips to reduce freezing as indicated; LE flexibility; core/postural strengthening; review seated, standing, supine and prone PWR! Moves PRN and re-introduce remaining position (quadruped) as tolerated   Elijah CHRISTELLA Hidden, PT 01/06/2024, 5:00 PM

## 2024-01-08 ENCOUNTER — Other Ambulatory Visit: Payer: Self-pay | Admitting: Neurology

## 2024-01-08 ENCOUNTER — Other Ambulatory Visit (HOSPITAL_BASED_OUTPATIENT_CLINIC_OR_DEPARTMENT_OTHER): Payer: Self-pay

## 2024-01-08 DIAGNOSIS — G20A1 Parkinson's disease without dyskinesia, without mention of fluctuations: Secondary | ICD-10-CM

## 2024-01-08 MED ORDER — DIVALPROEX SODIUM ER 250 MG PO TB24
250.0000 mg | ORAL_TABLET | Freq: Every day | ORAL | 0 refills | Status: DC
  Filled 2024-01-08: qty 90, 90d supply, fill #0

## 2024-01-09 ENCOUNTER — Ambulatory Visit: Admitting: Physical Therapy

## 2024-01-09 DIAGNOSIS — M6281 Muscle weakness (generalized): Secondary | ICD-10-CM | POA: Diagnosis not present

## 2024-01-09 DIAGNOSIS — G20A2 Parkinson's disease without dyskinesia, with fluctuations: Secondary | ICD-10-CM | POA: Diagnosis not present

## 2024-01-09 DIAGNOSIS — R296 Repeated falls: Secondary | ICD-10-CM | POA: Diagnosis not present

## 2024-01-09 DIAGNOSIS — R2681 Unsteadiness on feet: Secondary | ICD-10-CM

## 2024-01-09 DIAGNOSIS — R2689 Other abnormalities of gait and mobility: Secondary | ICD-10-CM

## 2024-01-09 NOTE — Therapy (Signed)
 OUTPATIENT PHYSICAL THERAPY PARKINSON'S TREATMENT   Patient Name: Vincent Walker MRN: [id number] DOB:[birth year], 78 y.o., male Today's Date: 01/09/2024   END OF SESSION:  PT End of Session - 01/09/24 1400     Visit Number 9    Date for Recertification  02/20/24    Authorization Type Medicare & Federal BCBS    PT Start Time 1400    PT Stop Time 1445    PT Time Calculation (min) 45 min    Activity Tolerance Patient tolerated treatment well    Behavior During Therapy [facility] for tasks assessed/performed;Flat affect              Past Medical History:  Diagnosis Date   Adenomatous polyp of ascending colon 10/09/2018   Allergies 07/07/2018   Arthritis    Astigmatism of both eyes with presbyopia 08/21/2022   Blepharitis of upper and lower eyelids of both eyes 01/10/2016   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 Required nasal packing a couple of days ago.  Had a similar episode of epistaxis a little over a year ago.  At that visit, I could not see the exact bleeding spot. EXAMINATION after packing removal reveals several excoriated areas anteriorly but I was unable to see t   Essential hypertension 12/25/2011   Eustachian tube dysfunction, left 02/21/2021   Flank pain 07/19/2022   History of blood transfusion 2010   After Kidney surgery   History of chicken pox    History of renal cell carcinoma 02/08/2015   diagnosed and removed in 2010 Right Monitored by Dr Tanda Moats of urology at [facility] Dr Claudine Alley, nephrology at [facility]   Hyperglycemia 12/23/2015   Increased thyroid  stimulating hormone (TSH) level 06/07/2017   Ingrown left big toenail 02/14/2015   Left foot pain  02/14/2015   MVP (mitral valve prolapse) 05/14/2016   Parkinson's disease (HCC) 12/23/2015   Posterior vitreous detachment of both eyes 08/21/2022   Primary open-angle glaucoma, left eye, severe stage 05/05/2021   Primary open-angle glaucoma, right eye, moderate stage 01/10/2016   Pseudophakia, both eyes 08/21/2022   Right hip pain 12/12/2016   Stage 3 chronic kidney disease 12/25/2011   Thrombocytopenia 02/08/2015   Tremor 02/14/2015   Past Surgical History:  Procedure Laterality Date   APPENDECTOMY  1995   CATARACT EXTRACTION, BILATERAL     COLONOSCOPY     COLONOSCOPY WITH PROPOFOL  N/A 12/17/2018   Procedure: COLONOSCOPY WITH PROPOFOL ;  Surgeon: Wilhelmenia Aloha Raddle., MD;  Location: [facility] ENDOSCOPY;  Service: Gastroenterology;  Laterality: N/A;   ENDOSCOPIC MUCOSAL RESECTION N/A 12/17/2018   Procedure: ENDOSCOPIC MUCOSAL RESECTION;  Surgeon: Wilhelmenia Aloha Raddle., MD;  Location: [facility] ENDOSCOPY;  Service: Gastroenterology;  Laterality: N/A;   HEMOSTASIS CLIP PLACEMENT  12/17/2018   Procedure: HEMOSTASIS CLIP PLACEMENT;  Surgeon: Wilhelmenia Aloha Raddle., MD;  Location: [facility] ENDOSCOPY;  Service: Gastroenterology;;   left knee scope  2003   POLYPECTOMY  12/17/2018   Procedure: POLYPECTOMY;  Surgeon: Wilhelmenia Aloha Raddle., MD;  Location: [facility] ENDOSCOPY;  Service: Gastroenterology;;   right knee scope  1999   SUBMUCOSAL LIFTING INJECTION  12/17/2018   Procedure: SUBMUCOSAL LIFTING INJECTION;  Surgeon: Wilhelmenia Aloha Raddle., MD;  Location: [facility] ENDOSCOPY;  Service: Gastroenterology;;  TOE SURGERY Left    metal 2nd toe- and top of foot- straighten bone   TONSILLECTOMY     TOTAL KNEE ARTHROPLASTY Left 07/06/2014   TOTAL NEPHRECTOMY Right    Patient Active Problem List   Diagnosis Date Noted   Cellulitis of left foot 09/17/2023   Dementia due to Parkinson's disease 11/20/2022   Dry eye syndrome of both eyes 08/28/2022   Astigmatism of both eyes with presbyopia 08/21/2022   Choroidal  nevus of right eye 08/21/2022   Posterior vitreous detachment of both eyes 08/21/2022   Pseudophakia, both eyes 08/21/2022   Primary open-angle glaucoma, left eye, severe stage 05/05/2021   Eustachian tube dysfunction, left 02/21/2021   Early dry stage nonexudative age-related macular degeneration of both eyes 03/09/2019   Adenomatous polyp of ascending colon 10/09/2018   Epistaxis 07/07/2018   Allergies 07/07/2018   Obesity 06/15/2018   Increased thyroid  stimulating hormone (TSH) level 06/07/2017   MVP (mitral valve prolapse) 05/14/2016   Blepharitis of upper and lower eyelids of both eyes 01/10/2016   Primary open-angle glaucoma, right eye, moderate stage 01/10/2016   Parkinson's disease (HCC) 12/23/2015   Hyperglycemia 12/23/2015   Tremor 02/14/2015   Dyslipidemia 02/08/2015   History of renal cell carcinoma 02/08/2015   Thrombocytopenia 02/08/2015   History of chicken pox    Essential hypertension 12/25/2011   Stage 3 chronic kidney disease 12/25/2011    PCP: Domenica Harlene LABOR, MD   REFERRING PROVIDER: Evonnie Asberry RAMAN, DO   REFERRING DIAG: G20.A2 (ICD-10-CM) - Parkinson's disease without dyskinesia, with fluctuating manifestations (HCC)   THERAPY DIAG:  Parkinson's disease without dyskinesia, with fluctuations (HCC)  Other abnormalities of gait and mobility  Unsteadiness on feet  Muscle weakness (generalized)  Repeated falls  RATIONALE FOR EVALUATION AND TREATMENT: Rehabilitation  ONSET DATE: PD diagnosis 5+ years ago   NEXT MD VISIT: 04/14/24   SUBJECTIVE:                                                                                                                                                                                                         SUBJECTIVE STATEMENT: Pt denies any increased soreness after trying the prone PWR! Moves last session.   EVAL:  Pt reports worsening of his Parkinson's disease. His biggest concern is that he is having a harder  time walking and and to switch to a 2WW/RW from his cane.  He states his wife also notes more forgetfulness/memory issues. He recognizes increased freezing of gait, typcially worse in the mornings and lessening as the day progresses.  He is worried about falling and injuring himself to the point where he will need someone to take care of him.  Pt accompanied by: significant other - wife Annie [facility]) in waiting room   PAIN: Are you having pain? 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 Yes. Number of falls 2 (both while using RW to go around a corner)  LIVING ENVIRONMENT: Lives with: lives with their spouse Lives in: House/apartment Stairs: No Has following equipment at home: Single point cane, Environmental consultant - 2 wheeled, Grab bars, and Recumbent Bike  OCCUPATION: Retired  PLOF: Independent with household mobility with device, Independent with community mobility with device, Needs assistance with homemaking, and Leisure: mostly sedentary recently     PATIENT GOALS: To get myself going to help bring back some of what I've lost.   OBJECTIVE: (objective measures completed at initial evaluation unless otherwise dated)  DIAGNOSTIC FINDINGS:  09/15/23 - CT CERVICAL SPINE WITHOUT CONTRAST (s/p fall, hit right-side) IMPRESSION: 1. No acute cervical spine fracture. 2. Extensive multilevel cervical degenerative changes.  09/15/23 - CT HEAD WITHOUT CONTRAST (s/p fall, hit right-side) IMPRESSION: 1. No acute intracranial process.  09/15/23 - RIGHT SHOULDER - 2+ VIEW (s/p fall, hit right-side) IMPRESSION: 1. No acute fracture. 2. Bone hypertrophy likely from remote trauma to the  distal clavicle. Heterotopic bone formation likely from remote trauma to the coracoclavicular ligament. 3. Mild high-riding humeral head which may be secondary to a superior rotator cuff tear. 4. Mild glenohumeral osteoarthritis.  09/15/23 - LEFT FOOT - COMPLETE 3+ VIEW (L foot pain) IMPRESSION: 1. No significant change from prior.  No acute fracture. 2. Mild hallux valgus. 3. Postsurgical changes of second and third PIP arthrodesis.  09/29/23 - MR FOOT LEFT W WO CONTRAST FINDINGS: Postoperative change with metal artifact to the distal second, third, and fourth digits. No fracture or dislocation. No erosions. Mild second tarsometatarsal joint osteoarthritis with joint space narrowing and mild degenerative edema. The visualized marrow signal is otherwise unremarkable.   No obvious soft tissue wound/ulceration. This may be obscured by metal artifact. Moderate diffuse muscle atrophy. Mild likely reactive myositis. The tendons are unremarkable. Moderate to severe dorsal subcutaneous edema. No organized fluid collection.   IMPRESSION: The exam is slightly limited by metal artifact.   Moderate to severe dorsal subcutaneous edema. Correlate for lymphedema versus cellulitis.   No obvious soft tissue wound/ulceration or evidence of osteomyelitis. If continued clinical concern, CT scan and/or bone scan could be performed to further characterize. This would limit the metal artifact.   Diffuse muscle atrophy.   Mild degenerative change.  COGNITION: Overall cognitive status: Impaired - decreased short-term memory and recall of new information    SENSATION: WFL  COORDINATION: B heel-toe mildly diminished. Impaired heel to shin bilaterally.  MUSCLE TONE: Increased LE muscle tone noted during attempts at PROM/flexibility assessment.   POSTURE:  rounded shoulders, forward head, increased thoracic kyphosis, and flexed trunk   MUSCLE LENGTH: Hamstrings: mod tight B ITB: mild tight  B Piriformis: mod/severe tight B Hip flexors: mod/severe tight B Quads: mild/mod tight B Heelcord: mild tight B  LOWER EXTREMITY ROM:    Mildly limited B hip and ankle ROM (R>L), mostly due  to limited muscle flexibility as above.   LOWER EXTREMITY MMT:    MMT Right eval Left eval  Hip flexion 4+ 4+  Hip extension 4- 4  Hip abduction 4 4  Hip adduction 4+ 4+  Hip internal rotation 5 5  Hip external rotation 4+ 4+  Knee flexion 4+ 4+  Knee extension 4+ 4+  Ankle dorsiflexion 4+ 4+  Ankle plantarflexion    Ankle inversion    Ankle eversion    (Blank rows = not tested)  BED MOBILITY:  Minimal assist overall  TRANSFERS: Assistive device utilized: Environmental consultant - 2 wheeled  Sit to stand: Modified independence and SBA - cues to push up from chair rather than pull up on walker Stand to sit: Modified independence Chair to chair: NT Floor: NT  GAIT: Distance walked: Clinic distances Assistive device utilized: Environmental consultant - 2 wheeled Level of assistance: Modified independence Gait pattern: step through pattern, decreased stride length, shuffling, festinating, and trunk flexed Comments: Patient reports intermittent freezing episodes, most common in the morning and subsiding as the day progresses  FUNCTIONAL TESTS:  5 times sit to stand: 12/13/23 - 23 seconds back on heels Timed up and go (TUG): 12/13/23 - no device 23 seconds lost balance with turn Manual =  Cognitive =  10 meter walk test: 23.25 sec with 2WW Gait Speed: 1.41 ft/sec w/o AD, limited community ambulator; gait speed of <1.8 ft/sec indicates the risk for recurrent falls  Berg Balance Scale: 12/13/23 - 41/56 Functional gait assessment: TBA Interpretation of FGA scores: Non-Specific Older Adults Cutoff Score: <=22/30 = risk of falls Parkinson's Disease Cutoff score <15/30= fall risk (Hoehn & Yahr 1-4)  Minimally Clinically Important Difference (MCID)  Stroke (acute, subacute, and chronic) = MDC: 4.2 points Vestibular (acute)  = MDC: 6 points Community Dwelling Older Adults =  MCID: 4 points Parkinson's Disease  =  MDC: 4.3 points  (Academy of Neurologic Physical Therapy (nd). Functional Gait Assessment. Retrieved from https://www.neuropt.org/docs/default-source/cpgs/core-outcome-measures/function-gait-assessment-pocket-guide-proof9-(2).pdf?sfvrsn=b44f35043_0.)  Baseline as of D/C from last PT episode (11/20/22): 11/15/22: 5xSTS = 13.88 sec w/o UE assist (occasional hands on knees) TUG:  Normal = 9.93 sec Manual = 10.19 sec Cognitive = 13.16 sec (unable to complete cognitive task) : w/o AD = 10.62 sec, 9.32 sec (fastest comfortable speed w/o AD) with SPC = 11.59 sec with B hiking poles = 11.62 sec Gait speed: 3.09 ft/sec w/o AD  3.52 ft/sec (fastest comfortable speed w/o AD) 2.83 ft/sec with SPC  2.82 ft/sec with B hiking poles    11/20/22: Berg = 50/56, 46-51 moderate risk for falls (>50%)  FGA = 21/30, 19-24 = medium risk for fall   PATIENT SURVEYS:  ABC scale: The Activities-Specific Balance Confidence (ABC) Scale 0% 10 20 30  40 50 60 70 80 90 100% No confidence<->completely confident  "How confident are you that you will not lose your balance or become unsteady when you . . .  Date tested 11/28/23 *confidence with RW  Walk around the house 80%  2. Walk up or down stairs 50%  3. Bend over and pick up a slipper from in front of a closet floor 70%  4. Reach for a small can off a shelf at eye level 100%  5. Stand on tip toes and reach for something above your head 60%  6. Stand on a chair and reach for something 0%  7. Sweep the floor 0%  8. Walk outside the house to a car parked in the driveway 80%  9. Get into or  out of a car 90%  10. Walk across a parking lot to the mall 70%  11. Walk up or down a ramp 70%  12. Walk in a crowded mall where people rapidly walk past you 60%  13. Are bumped into by people as you walk through the mall 70%  14. Step onto or off of an escalator while you are  holding onto the railing 80%  15. Step onto or off an escalator while holding onto parcels such that you cannot hold onto the railing 0%  16. Walk outside on icy sidewalks 0%  Total: #/16 880 / 1600 = 55.0 %  50-80% = moderate level of physical functioning <69% indicates risk for recurrent falls in PD   TODAY'S TREATMENT:   01/09/2024  THERAPEUTIC EXERCISE: To improve strength and endurance.   NuStep - L6 x 6 min - UE/LE to promote reciprocal movement patterns  PHYSICAL PERFORMANCE TEST or MEASUREMENT: Dynamic Gait Index  Level Surface Mild Impairment   Change in Gait Speed Mild Impairment   Gait with Horizontal Head Turns Mild Impairment   Gait with Vertical Head Turns Mild Impairment   Gait and Pivot Turn Mild Impairment   Step Over Obstacle Mild Impairment   Step Around Obstacles Mild Impairment   Steps Moderate Impairment   Total Score 15   DGI comment: Scores of 19 or less are predictive of falls in older community living adults      GAIT TRAINING: To normalize gait pattern and improve safety with 4WW/rollator.  = 8.75 sec with RW Gait speed = 3.75 ft/sec with RW Gait training within clinic with 4WW/rollator: Focusing on posture and walker proximity for improved directional control while turning and negotiating obstacles.   Discussed benefits and drawbacks of use of 4WW as compared to RW in relation to stability and ease of mobility particularly on uneven surfaces - Will plan to introduce 4 WW with outdoor surfaces within next few visits weather permitting. Provided education on safe use of 4WW/rollator brakes during sit to stand transitions and to control speed of walker during gait.   01/06/2024  THERAPEUTIC EXERCISE: To improve strength and endurance.   NuStep - L6 x 6 min - UE/LE to promote reciprocal movement patterns  NEUROMUSCULAR RE-EDUCATION: To improve coordination, kinesthesia, posture, amplitude of movement, speed of movement to reduce bradykinesia, and  reduce rigidity.  Prone PWR! Moves (2 pillows under abdomen/hips to reduce low back strain): Up x 10 Rock x 10 Twist x 10 bil Step x 10   01/02/2024  THERAPEUTIC EXERCISE: To improve strength and endurance.   NuStep - L6 x 6 min - UE/LE to promote reciprocal movement patterns  THERAPEUTIC ACTIVITIES: To improve functional performance.  Demonstration, verbal and tactile cues throughout for technique. Reviewed education on tips to prevent freezing with transfers and gait - simulated approach from foyer into den navigating around furniture to his usual chair where he was experiencing the freezing yesterday.    NEUROMUSCULAR RE-EDUCATION: To improve coordination, kinesthesia, posture, proprioception, amplitude of movement, speed of movement to reduce bradykinesia, and reduce rigidity. Supine PWR! Moves: Up x 10 Rock x 10 Twist x 10 Step x 10    12/30/2023  THERAPEUTIC EXERCISE: To improve strength and endurance.    Rec Bike - L3 x 6 min  SELF CARE: Provided education to reduce fall risk and to promote safe home environment. Reviewed the Check for Safety - Home Fall Prevention Checklist for Older Adults to help identify fall risk hazards  in the home along with strategies to reduce fall risk at home  Reviewed education on tips to prevent freezing with transfers and gait.    NEUROMUSCULAR RE-EDUCATION: To improve balance, coordination, kinesthesia, posture, proprioception, reduce fall risk, amplitude of movement, speed of movement to reduce bradykinesia, and reduce rigidity. Seated PWR! Moves: Up x 10 Rock x 10 Twist x 10 Step x 10 PWR! Sit to stand x 10 Standing PWR! Moves: (standing in front on mat table with chair in front for intermittent UE support as needed) Up x 10 Rock x 10 Twist x 10 Step x 10    12/26/2023 THERAPEUTIC EXERCISE: To improve strength, endurance, and flexibility.  Demonstration, verbal and tactile cues throughout for technique.  NuStep - L6 x 6 min -  UE/LE to promote reciprocal movement patterns Seated hip hinge HS stretch x 30 bil Seated lunge position hip flexor/quad stretch x 30 bil Seated GTB HS curls x 10 bil Seated GTB LAQ x 10 bil  MANUAL THERAPY: To promote normalized muscle tension, improved flexibility, pain modulation, and reduced pain utilizing connective tissue massage, therapeutic massage, and myofascial release. Foam roller to B quads x ~2 min Roller stick to R/L hamstrings x 1-2 min  NEUROMUSCULAR RE-EDUCATION: To improve balance, coordination, kinesthesia, posture, proprioception, reduce fall risk, and amplitude of movement. Standing 4-way GTB SLR with RW support for balance x 10   12/23/2023  THERAPEUTIC EXERCISE: To improve strength and endurance.  Demonstration, verbal and tactile cues throughout for technique.  NuStep - L6 x 6 min Seated GTB B scap retraction + shoulder row x 10  Seated GTB B scap retraction + shoulder extension x 10  Seated bent over scap retraction + B shoulder extension x 10 with 5# db bilaterally Seated GTB hip ABD/ER clam 2 x 10 Seated GTB hip flexion march x 10  NEUROMUSCULAR RE-EDUCATION: To improve balance, coordination, kinesthesia, posture, proprioception, reduce fall risk, amplitude of movement, speed of movement to reduce bradykinesia, and reduce rigidity. Seated PWR! Moves: Up x 10 Rock x 10 Twist x 10 Step x 10 Standing hip extension with looped GTB at ankles x 10, UE support on RW (encouraged counter support at home) Standing hip abduction with looped GTB at ankles x 10, UE support on RW  THERAPEUTIC ACTIVITIES: To improve functional performance.  Demonstration, verbal and tactile cues throughout for technique. Sit to stand from Airex pad in chair w/o UE assist + GTB hip ABD isometric at distal thighs x 10   12/19/23 Nustep L5x41min UE/LE Seated Lumbar flexion stretch green pball x 30' 3 way Seated hamstring stretch x 30' BLE Seated figure 4 stretch x 30 BLE Seated side  bend stretch x 30' B Standing runner stretch x 30' Seated thoracic ext HBH 5x10 Standing pallof press GTB doubled x 10  Church pew weight shifts x 10   12/13/23 Nustep level 5 x 6 minutes TUG =  23 seconds, unsteady with turn no device but did touch wall 5XSTS = 23 seconds  really back on heels Berg = 41/56 On airex standing On airex ball toss, had to have another person for CGA tended to lose balance to the back Step over side to sided and forward back PVC T CGA Direction changes   11/19/2023  SELF CARE:  Reviewed eval findings and role of PT in addressing identified deficits as well as need for further assessment of balance.  Provided education on safe hand placement with RW during transfers to reduce fall risk.  PATIENT EDUCATION:  Education details: Gaffer with 4WW/rollator  Person educated: Patient Education method: Explanation, Demonstration, and Verbal cues Education comprehension: verbalized understanding, returned demonstration, verbal cues required, and needs further education  HOME EXERCISE PROGRAM: Access Code: [email or web address] URL: [email or web address] Date: 12/30/2023 Prepared by: Elijah Hidden  Exercises - Supine Lower Trunk Rotation  - 2 x daily - 7 x weekly - 2 sets - 10 reps - 10 sec hold - Seated Thoracic Lumbar Extension with Pectoralis Stretch  - 2 x daily - 7 x weekly - 2 sets - 10 reps - 5 sec hold - Seated Hamstring Stretch  - 2 x daily - 7 x weekly - 3 reps - 30 sec hold - Seated Figure 4 Piriformis Stretch  - 2 x daily - 7 x weekly - 3 reps - 30 sec hold - Standing Gastroc Stretch at Counter  - 2 x daily - 7 x weekly - 3 reps - 30 sec hold - Seated Quadratus Lumborum Stretch in Chair  - 2 x daily - 7 x weekly - 3 reps - 30 sec hold - Seated Flexion Stretch with Swiss Ball  - 2 x daily - 7 x weekly - 3 reps - 30 sec hold - Seated Thoracic Flexion and Rotation with Swiss Ball  - 2 x daily - 7 x weekly - 3 reps - 30 sec hold - Seated Bent  Over Shoulder Row with Dumbbells  - 1 x daily - 3 x weekly - 2 sets - 10 reps - 3 sec hold - Seated Shoulder Extension with Dumbbells  - 1 x daily - 3 x weekly - 2 sets - 10 reps - 3 sec hold - Standing Hip Extension with Resistance at Ankles and Counter Support  - 1 x daily - 3 x weekly - 2 sets - 10 reps - 3 sec hold - Standing Hip Abduction with Resistance at Ankles and Counter Support  - 1 x daily - 3 x weekly - 2 sets - 10 reps - 3 sec hold  Patient Education - Tips to reduce freezing episodes with standing or walking - Check for Safety  PWR! Moves: - Sitting - Standing - Supine - Prone   ASSESSMENT:  CLINICAL IMPRESSION: Assessed dynamic balance with gait using DGI as opposed to FGA as patient reports MD wants him using a walker full-time.  DGI score of 15/24 indicating high risk for falls.  Tiffany reports he has the most difficulty with the walker when using it to walk to his grandchildren's baseball games over uneven surfaces therefore introduced trial of use of 4WW/rollator during gait, explaining benefits with increased ease of maneuverability on uneven surfaces along with drawbacks of need for increased effort for balance and directional control.  Cues necessary for safety with use of brakes during transitional movements as well as need for upright posture and good walker proximity to improve obstacle negotiation during gait.  Will plan for further training with 4WW on outdoor surfaces within the next few visits as weather permits.  Will continue to work on further STG/LTG assessment as part of 10th visit progress note next visit.  Shalik will benefit from continued skilled PT to address ongoing postural, strength and balance deficits to improve mobility and activity tolerance with decreased pain interference and decreased risk for falls.   EVAL: DIONNE ROSSA is a 78 y.o. male who was referred to physical therapy for evaluation and treatment for Parkinson's disease.  Patient first  diagnosed with Parkinson's 5+ years  ago.  He has completed 2 prior PT episodes in 2022/2023 and 2024 within the [facility] system.  Most recent PT episode for Parkinson's disease was 09/25/2022 - 11/20/2022.  Since his last PT episode, he reports worsening of his PD leading to increased gait impairments and need to switch from single-point cane to RW/2WW.  Patient presents with physical impairments of decreased timing and coordination of gait, impaired ambulation, impaired standing balance, abnormal posture, bradykinesia with transfers, impaired activity tolerance, LE weakness, postural instability and decreased safety awareness impacting safe and independent functional mobility.  Examination revealed patient is at risk for falls and functional decline as evidenced by the following objective test measures: Gait speed 1.41 ft/sec, (2.62 ft/sec is needed for community access and <1.8 ft/sec is indicative of risk for recurrent falls), which demonstrates a significant decline from gait speed of.  He reports increased incidence of freezing of gait, most common in the morning and subsiding as the day progresses.  He has had 2 falls in the past 6 months, both of which occurred while turning with the RW.  Further balance testing with 5xSTS, TUG, Berg and/or DGI/FGA indicated however patient needing to use the bathroom preventing testing today.  ABC scale score of 55% indicates a moderate level of physical functioning, and score of <69% indicates risk for recurrent falls in PD.  Nikola will benefit from skilled PT to address above deficits to improve mobility and activity tolerance to help reach the maximal level of functional independence with mobility and gait with reduced risk for falls.  Patient and his wife demonstrates understanding of this POC and are in agreement with this plan.   OBJECTIVE IMPAIRMENTS: Abnormal gait, decreased activity tolerance, decreased balance, decreased coordination, decreased knowledge  of condition, decreased knowledge of use of DME, decreased mobility, difficulty walking, decreased ROM, decreased strength, decreased safety awareness, increased fascial restrictions, impaired perceived functional ability, impaired flexibility, improper body mechanics, postural dysfunction, and pain.   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   REHAB POTENTIAL: Good  CLINICAL DECISION MAKING: Unstable/unpredictable  EVALUATION COMPLEXITY: High   GOALS: Goals reviewed with patient? Yes  SHORT TERM GOALS: Target date: 01/09/2024  Patient will be independent with initial HEP. Baseline: TBD 12/23/23 - Pt reports no concerns with current HEP exercises Goal status: MET - 01/09/24  2.  Patient will demonstrate decreased fall risk by scoring < 25 sec on TUG. Baseline: TBA Goal status: MET - 12/13/23 - 23 seconds  3.  Patient will be educated on strategies to decrease risk of falls.  Baseline:  Goal status: MET - 12/30/23  4.  Patient will verbalize tips to reduce freezing/festination with gait and turns. Baseline: Patient reports freezing episodes most common in the morning, subsiding as the day progresses. 12/30/23 - reviewed today Goal status: IN PROGRESS - 01/02/24 - simulated home navigation in places where he is more likely to experiencing freezing   LONG TERM GOALS: Target date: 02/20/2024  Patient will be  independent with ongoing/advanced HEP for self-management at home incorporating PWR! Moves as  indicated .  Baseline:  Goal status: IN PROGRESS - 01/06/24  2.  Patient will be able to ambulate 600' with LRAD with good safety to access community.  Baseline:  Goal status: INITIAL   3.  Patient will be able to step up/down curb safely with LRAD for safety with community ambulation.  Baseline:  Goal status: INITIAL   4.  Patient will demonstrate gait speed of >/= 1.8 ft/sec (0.55 m/s) to be a safe limited community ambulator with decreased risk for recurrent falls.  Baseline: 1.41 ft/sec with RW Goal status: MET - 01/09/24 - 3.75 ft/sec with RW  5.  Patient will improve 5xSTS time to </= 16 seconds to demonstrate improved functional strength and transfer efficiency. Baseline: 12/13/23 - 23 seconds  Goal status: IN PROGRESS  6.  Patient will demonstrate at least 19/24 on DGI or 19/30 on FGA to improve gait stability and reduce risk for falls. Baseline: 01/09/24 - DGI = 15/24 Goal status: IN PROGRESS  7.  Patient will improve Berg score by at least 8 points to improve safety and stability with ADLs in standing and reduce risk for falls.   Baseline: 12/13/23 - 41/56 Goal status: IN PROGRESS  8.  Patient will report >/= 69% on ABC scale to demonstrate improved balance confidence and decreased risk for falls. Baseline: 880 / 1600 = 55.0 % Goal status: INITIAL  9. Patient will verbalize understanding of local Parkinson's disease community resources, including community fitness post d/c. Baseline:  Goal status: INITIAL   PLAN:  PT FREQUENCY: 2x/week  PT DURATION: 12 weeks  PLANNED INTERVENTIONS: 97164- PT Re-evaluation, 97750- Physical Performance Testing, 97110-Therapeutic exercises, 97530- Therapeutic activity, [id number]- Neuromuscular re-education, 97535- Self Care, [id number]- Manual therapy, [phone]- Gait training, [phone]- Electrical stimulation (unattended), 97035- Ultrasound, 02966-  Ionotophoresis 4mg /ml Dexamethasone , 79439 (1-2 muscles), 20561 (3+ muscles)- Dry Needling, Patient/Family education, Balance training, Stair training, Taping, Joint mobilization, Spinal mobilization, Cryotherapy, and Moist heat  PLAN FOR NEXT SESSION: 10th visit PN; review education on tips to reduce freezing as indicated; LE flexibility; core/postural strengthening; review seated, standing, supine and prone PWR! Moves PRN and re-introduce remaining position (quadruped) as tolerated   Elijah CHRISTELLA Hidden, PT 01/09/2024, 7:23 PM

## 2024-01-12 ENCOUNTER — Other Ambulatory Visit (HOSPITAL_BASED_OUTPATIENT_CLINIC_OR_DEPARTMENT_OTHER): Payer: Self-pay

## 2024-01-13 ENCOUNTER — Other Ambulatory Visit (HOSPITAL_BASED_OUTPATIENT_CLINIC_OR_DEPARTMENT_OTHER): Payer: Self-pay

## 2024-01-13 ENCOUNTER — Other Ambulatory Visit: Payer: Self-pay

## 2024-01-13 ENCOUNTER — Encounter: Payer: Self-pay | Admitting: Neurology

## 2024-01-13 MED ORDER — TAMSULOSIN HCL 0.4 MG PO CAPS
0.4000 mg | ORAL_CAPSULE | Freq: Every day | ORAL | 0 refills | Status: DC
  Filled 2024-01-13: qty 30, 30d supply, fill #0

## 2024-01-14 ENCOUNTER — Encounter: Payer: Self-pay | Admitting: Physical Therapy

## 2024-01-14 ENCOUNTER — Other Ambulatory Visit (HOSPITAL_BASED_OUTPATIENT_CLINIC_OR_DEPARTMENT_OTHER): Payer: Self-pay

## 2024-01-14 ENCOUNTER — Ambulatory Visit: Admitting: Physical Therapy

## 2024-01-14 DIAGNOSIS — R2689 Other abnormalities of gait and mobility: Secondary | ICD-10-CM | POA: Diagnosis not present

## 2024-01-14 DIAGNOSIS — G20A2 Parkinson's disease without dyskinesia, with fluctuations: Secondary | ICD-10-CM

## 2024-01-14 DIAGNOSIS — R2681 Unsteadiness on feet: Secondary | ICD-10-CM

## 2024-01-14 DIAGNOSIS — R296 Repeated falls: Secondary | ICD-10-CM

## 2024-01-14 DIAGNOSIS — M6281 Muscle weakness (generalized): Secondary | ICD-10-CM

## 2024-01-14 NOTE — Therapy (Signed)
 OUTPATIENT PHYSICAL THERAPY PARKINSON'S TREATMENT  Progress Note  Reporting Period 11/28/2023 to 01/14/2024   See note below for Objective Data and Assessment of Progress/Goals.     Patient Name: Vincent Walker MRN: [id number] DOB:[birth year], 78 y.o., male Today's Date: 01/14/2024   END OF SESSION:  PT End of Session - 01/14/24 1534     Visit Number 10    Date for Recertification  02/20/24    Authorization Type Medicare & Federal BCBS    Progress Note Due on Visit 20    PT Start Time 1534    PT Stop Time 1618    PT Time Calculation (min) 44 min    Activity Tolerance Patient tolerated treatment well    Behavior During Therapy [facility] for tasks assessed/performed;Flat affect               Past Medical History:  Diagnosis Date   Adenomatous polyp of ascending colon 10/09/2018   Allergies 07/07/2018   Arthritis    Astigmatism of both eyes with presbyopia 08/21/2022   Blepharitis of upper and lower eyelids of both eyes 01/10/2016   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 Required nasal packing a couple of days ago.  Had a similar episode of epistaxis a little over a year ago.  At that visit, I could not see the exact bleeding spot. EXAMINATION after packing removal reveals several excoriated areas anteriorly but I was unable to see t   Essential hypertension 12/25/2011   Eustachian tube dysfunction, left 02/21/2021   Flank pain 07/19/2022   History of blood transfusion 2010   After Kidney surgery   History of chicken pox    History of renal cell carcinoma 02/08/2015   diagnosed and removed in 2010 Right Monitored by Dr Tanda Moats of urology at [facility] Dr Claudine Alley,  nephrology at [facility]   Hyperglycemia 12/23/2015   Increased thyroid  stimulating hormone (TSH) level 06/07/2017   Ingrown left big toenail 02/14/2015   Left foot pain 02/14/2015   MVP (mitral valve prolapse) 05/14/2016   Parkinson's disease (HCC) 12/23/2015   Posterior vitreous detachment of both eyes 08/21/2022   Primary open-angle glaucoma, left eye, severe stage 05/05/2021   Primary open-angle glaucoma, right eye, moderate stage 01/10/2016   Pseudophakia, both eyes 08/21/2022   Right hip pain 12/12/2016   Stage 3 chronic kidney disease 12/25/2011   Thrombocytopenia 02/08/2015   Tremor 02/14/2015   Past Surgical History:  Procedure Laterality Date   APPENDECTOMY  1995   CATARACT EXTRACTION, BILATERAL     COLONOSCOPY     COLONOSCOPY WITH PROPOFOL  N/A 12/17/2018   Procedure: COLONOSCOPY WITH PROPOFOL ;  Surgeon: Wilhelmenia Aloha Raddle., MD;  Location: [facility] ENDOSCOPY;  Service: Gastroenterology;  Laterality: N/A;   ENDOSCOPIC MUCOSAL RESECTION N/A 12/17/2018   Procedure: ENDOSCOPIC MUCOSAL RESECTION;  Surgeon: Wilhelmenia Aloha Raddle., MD;  Location: [facility] ENDOSCOPY;  Service: Gastroenterology;  Laterality: N/A;   HEMOSTASIS CLIP PLACEMENT  12/17/2018   Procedure: HEMOSTASIS CLIP PLACEMENT;  Surgeon: Wilhelmenia Aloha Raddle., MD;  Location: [facility] ENDOSCOPY;  Service: Gastroenterology;;   left knee scope  2003   POLYPECTOMY  12/17/2018   Procedure: POLYPECTOMY;  Surgeon: Wilhelmenia Aloha Raddle., MD;  Location: [facility] ENDOSCOPY;  Service: Gastroenterology;;  right knee scope  1999   SUBMUCOSAL LIFTING INJECTION  12/17/2018   Procedure: SUBMUCOSAL LIFTING INJECTION;  Surgeon: Wilhelmenia Aloha Raddle., MD;  Location: [facility] ENDOSCOPY;  Service: Gastroenterology;;   TOE SURGERY Left    metal 2nd toe- and top of foot- straighten bone   TONSILLECTOMY     TOTAL KNEE ARTHROPLASTY Left 07/06/2014   TOTAL NEPHRECTOMY Right    Patient Active Problem List   Diagnosis Date Noted   Cellulitis of left foot  09/17/2023   Dementia due to Parkinson's disease 11/20/2022   Dry eye syndrome of both eyes 08/28/2022   Astigmatism of both eyes with presbyopia 08/21/2022   Choroidal nevus of right eye 08/21/2022   Posterior vitreous detachment of both eyes 08/21/2022   Pseudophakia, both eyes 08/21/2022   Primary open-angle glaucoma, left eye, severe stage 05/05/2021   Eustachian tube dysfunction, left 02/21/2021   Early dry stage nonexudative age-related macular degeneration of both eyes 03/09/2019   Adenomatous polyp of ascending colon 10/09/2018   Epistaxis 07/07/2018   Allergies 07/07/2018   Obesity 06/15/2018   Increased thyroid  stimulating hormone (TSH) level 06/07/2017   MVP (mitral valve prolapse) 05/14/2016   Blepharitis of upper and lower eyelids of both eyes 01/10/2016   Primary open-angle glaucoma, right eye, moderate stage 01/10/2016   Parkinson's disease (HCC) 12/23/2015   Hyperglycemia 12/23/2015   Tremor 02/14/2015   Dyslipidemia 02/08/2015   History of renal cell carcinoma 02/08/2015   Thrombocytopenia 02/08/2015   History of chicken pox    Essential hypertension 12/25/2011   Stage 3 chronic kidney disease 12/25/2011    PCP: Domenica Harlene LABOR, MD   REFERRING PROVIDER: Evonnie Asberry RAMAN, DO   REFERRING DIAG: G20.A2 (ICD-10-CM) - Parkinson's disease without dyskinesia, with fluctuating manifestations (HCC)   THERAPY DIAG:  Parkinson's disease without dyskinesia, with fluctuations (HCC)  Other abnormalities of gait and mobility  Unsteadiness on feet  Muscle weakness (generalized)  Repeated falls  RATIONALE FOR EVALUATION AND TREATMENT: Rehabilitation  ONSET DATE: PD diagnosis 5+ years ago   NEXT MD VISIT: 04/14/24   SUBJECTIVE:                                                                                                                                                                                                         SUBJECTIVE STATEMENT: Pt arrives to PT  with new 4WW/rollator today which he reports he purchased on [employer]. He notes increased ease of walking with 4WW but does state that it is tight using it in his bathroom.   EVAL:  Pt  reports worsening of his Parkinson's disease. His biggest concern is that he is having a harder time walking and had to switch to a 2WW/RW from his cane.  He states his wife also notes more forgetfulness/memory issues. He recognizes increased freezing of gait, typcially worse in the mornings and lessening as the day progresses.  He is worried about falling and injuring himself to the point where he will need someone to take care of him.  Pt accompanied by: significant other - wife [facility]) in waiting room   PAIN: Are you having pain? 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 Yes. Number of falls 2 (both while using RW to go around a corner)  LIVING ENVIRONMENT: Lives with: lives with their spouse Lives in: House/apartment Stairs: No Has following equipment at home: Single point cane, Environmental consultant - 2 wheeled, Grab bars, and Recumbent Bike  OCCUPATION: Retired  PLOF: Independent with household mobility with device, Independent with community mobility with device, Needs assistance with homemaking, and Leisure: mostly sedentary recently     PATIENT GOALS: To get myself going to help bring back some of what I've lost.   OBJECTIVE: (objective measures completed at initial evaluation unless otherwise dated)  DIAGNOSTIC FINDINGS:  09/15/23 - CT CERVICAL SPINE WITHOUT CONTRAST (s/p fall, hit right-side) IMPRESSION: 1. No acute cervical spine fracture. 2. Extensive multilevel cervical degenerative  changes.  09/15/23 - CT HEAD WITHOUT CONTRAST (s/p fall, hit right-side) IMPRESSION: 1. No acute intracranial process.  09/15/23 - RIGHT SHOULDER - 2+ VIEW (s/p fall, hit right-side) IMPRESSION: 1. No acute fracture. 2. Bone hypertrophy likely from remote trauma to the distal clavicle. Heterotopic bone formation likely from remote trauma to the coracoclavicular ligament. 3. Mild high-riding humeral head which may be secondary to a superior rotator cuff tear. 4. Mild glenohumeral osteoarthritis.  09/15/23 - LEFT FOOT - COMPLETE 3+ VIEW (L foot pain) IMPRESSION: 1. No significant change from prior.  No acute fracture. 2. Mild hallux valgus. 3. Postsurgical changes of second and third PIP arthrodesis.  09/29/23 - MR FOOT LEFT W WO CONTRAST FINDINGS: Postoperative change with metal artifact to the distal second, third, and fourth digits. No fracture or dislocation. No erosions. Mild second tarsometatarsal joint osteoarthritis with joint space narrowing and mild degenerative edema. The visualized marrow signal is otherwise unremarkable.   No obvious soft tissue wound/ulceration. This may be obscured by metal artifact. Moderate diffuse muscle atrophy. Mild likely reactive myositis. The tendons are unremarkable. Moderate to severe dorsal subcutaneous edema. No organized fluid collection.   IMPRESSION: The exam is slightly limited by metal artifact.   Moderate to severe dorsal subcutaneous edema. Correlate for lymphedema versus cellulitis.   No obvious soft tissue wound/ulceration or evidence of osteomyelitis. If continued clinical concern, CT scan and/or bone scan could be performed to further characterize. This would limit the metal artifact.   Diffuse muscle atrophy.   Mild degenerative change.  COGNITION: Overall cognitive status: Impaired - decreased short-term memory and recall of new information    SENSATION: WFL  COORDINATION: B heel-toe mildly diminished. Impaired  heel to shin bilaterally.  MUSCLE TONE: Increased LE muscle tone noted during attempts at PROM/flexibility assessment.   POSTURE:  rounded shoulders, forward head, increased thoracic kyphosis, and flexed trunk   MUSCLE LENGTH: Hamstrings: mod tight B ITB: mild tight B Piriformis: mod/severe tight B Hip flexors: mod/severe tight B Quads: mild/mod tight B Heelcord: mild tight B  LOWER EXTREMITY ROM:    Mildly limited B hip and ankle ROM (R>L), mostly due to limited muscle flexibility as above.   LOWER EXTREMITY MMT:    MMT Right eval Left eval  Hip flexion 4+ 4+  Hip extension 4- 4  Hip abduction 4 4  Hip adduction 4+ 4+  Hip internal rotation 5 5  Hip external rotation 4+ 4+  Knee flexion 4+ 4+  Knee extension 4+ 4+  Ankle dorsiflexion 4+ 4+  Ankle plantarflexion    Ankle inversion    Ankle eversion    (Blank rows = not tested)  BED MOBILITY:  Minimal assist overall  TRANSFERS: Assistive device utilized: Environmental consultant - 2 wheeled  Sit to stand: Modified independence and SBA - cues to push up from chair rather than pull up on walker Stand to sit: Modified independence Chair to chair: NT Floor: NT  GAIT: Distance walked: Clinic distances Assistive device utilized: Environmental consultant - 2 wheeled Level of assistance: Modified independence Gait pattern: step through pattern, decreased stride length, shuffling, festinating, and trunk flexed Comments: Patient reports intermittent freezing episodes, most common in the morning and subsiding as the day progresses  FUNCTIONAL TESTS:  5 times sit to stand: 12/13/23 - 23 seconds back on heels Timed up and go (TUG): 12/13/23 - no device 23 seconds lost balance with turn Manual =  Cognitive =  10 meter walk test: 23.25 sec with 2WW Gait Speed: 1.41 ft/sec w/o AD, limited community ambulator; gait speed of <1.8 ft/sec indicates the risk for recurrent falls  Berg Balance Scale: 12/13/23 - 41/56, 37-45 = Significant (>80%) fall risk  Functional  gait assessment: TBA Interpretation of FGA scores: Non-Specific Older Adults Cutoff Score: <=22/30 = risk of falls Parkinson's Disease Cutoff score <15/30= fall risk (Hoehn & Yahr 1-4)  Minimally Clinically Important Difference (MCID)  Stroke (acute, subacute, and chronic) = MDC: 4.2 points Vestibular (acute) = MDC: 6 points Community Dwelling Older Adults =  MCID: 4 points Parkinson's Disease  =  MDC: 4.3 points  (Academy of Neurologic Physical Therapy (nd). Functional Gait Assessment. Retrieved from https://www.neuropt.org/docs/default-source/cpgs/core-outcome-measures/function-gait-assessment-pocket-guide-proof9-(2).pdf?sfvrsn=b24f35043_0.)  Baseline as of D/C from last PT episode (11/20/22): 11/15/22: 5xSTS = 13.88 sec w/o UE assist (occasional hands on knees) TUG:  Normal = 9.93 sec Manual = 10.19 sec Cognitive = 13.16 sec (unable to complete cognitive task) : w/o AD = 10.62 sec, 9.32 sec (fastest comfortable speed w/o AD) with SPC = 11.59 sec with B hiking poles = 11.62 sec Gait speed: 3.09 ft/sec w/o AD  3.52 ft/sec (fastest comfortable speed w/o AD) 2.83 ft/sec with SPC  2.82 ft/sec with B hiking poles    11/20/22: Berg = 50/56, 46-51 moderate risk for falls (>50%)  FGA = 21/30, 19-24 = medium risk for fall   PATIENT SURVEYS:  ABC scale: The Activities-Specific Balance Confidence (ABC) Scale 0% 10 20 30  40 50 60 70 80 90 100% No confidence<->completely confident  "How confident are you that you will not lose your balance or become unsteady when you . . .  Date tested 11/28/23 *confidence with RW 01/14/24 *confidence with RW/4WW  Walk around the house 80% 90%  2. Walk up or down stairs 50% 80%  3. Bend over and pick up a slipper from in front  of a closet floor 70% 80%  4. Reach for a small can off a shelf at eye level 100% 100%  5. Stand on tip toes and reach for something above your head 60% 70%  6. Stand on a chair and reach for something 0% 20%  7. Sweep the  floor 0% 60%  8. Walk outside the house to a car parked in the driveway 80% 100%  9. Get into or out of a car 90% 100%  10. Walk across a parking lot to the mall 70% 90%  11. Walk up or down a ramp 70% 80%  12. Walk in a crowded mall where people rapidly walk past you 60% 90%  13. Are bumped into by people as you walk through the mall 70% 80%  14. Step onto or off of an escalator while you are holding onto the railing 80% 90%  15. Step onto or off an escalator while holding onto parcels such that you cannot hold onto the railing 0% 30%  16. Walk outside on icy sidewalks 0% 0%  Total: #/16 880 / 1600 = 55.0 % 1160 / 1600 = 72.5 %  50-80% = moderate level of physical functioning <69% indicates risk for recurrent falls in PD   TODAY'S TREATMENT:   01/14/2024 - 10th visit PN THERAPEUTIC EXERCISE: To improve strength and endurance.   NuStep - L6 x 6 min - UE/LE to promote reciprocal movement patterns  THERAPEUTIC ACTIVITIES: To improve functional performance.  Demonstration, verbal and tactile cues throughout for technique. ABC scale: 1160 / 1600 = 72.5 %, indicates moderate level physical functioning 5xSTS = 14.41 sec w/o UE assist   PHYSICAL PERFORMANCE TEST or MEASUREMENT: Berg Balance Test  Sit to Stand Able to stand without using hands and stabilize independently   Standing Unsupported Able to stand safely 2 minutes   Sitting with Back Unsupported but Feet Supported on Floor or Stool Able to sit safely and securely 2 minutes   Stand to Sit Sits safely with minimal use of hands   Transfers Able to transfer safely, minor use of hands   Standing Unsupported with Eyes Closed Able to stand 10 seconds safely   Standing Unsupported with Feet Together Able to place feet together independently and stand 1 minute safely   From Standing, Reach Forward with Outstretched Arm Can reach forward >12 cm safely (5)   From Standing Position, Pick up Object from Floor Able to pick up shoe safely and  easily   From Standing Position, Turn to Look Behind Over each Shoulder Looks behind one side only/other side shows less weight shift   Turn 360 Degrees Able to turn 360 degrees safely but slowly   Standing Unsupported, Alternately Place Feet on Step/Stool Able to stand independently and complete 8 steps >20 seconds   Standing Unsupported, One Foot in Front Able to take small step independently and hold 30 seconds   Standing on One Leg Tries to lift leg/unable to hold 3 seconds but remains standing independently   Total Score 46   Berg comment: 46-51 = Moderate (>50%) fall risk       SELF CARE: Provided education to improve safety with use of 4WW/rollator for assistive device and to reduce fall risk.  Patient arrived to PT today with new recently purchased 4WW/rollator today (just set up last night).  Provided cues/instruction in safe use of 4WW/rollator including: Safe transfer technique with walker  Need to lock brakes prior to initiation of transfer  Proper hand  placement on seating surface/armrest of chair during both sit to stand and stand to sit transition Upright posture and proper foot placement between rear wheels of walker during gait  GAIT TRAINING: To normalize gait pattern and improve safety with 4WW/rollator.  600' with 4 WW/rollator on level surfaces indoors as well as level and uneven surfaces outdoors including sidewalks, inclines, pavement and grass - repeated cues for upright posture and proper foot placement between rear wheels of walker as well as for heelstrike on weight acceptance to increase foot clearance and reduce scuffing/shuffling of feet Curb negotiation - instructions provided for safe approach and negotiation of curb with 4WW/rollator including proper use of brakes during transition up/down   01/09/2024  THERAPEUTIC EXERCISE: To improve strength and endurance.   NuStep - L6 x 6 min - UE/LE to promote reciprocal movement patterns  PHYSICAL PERFORMANCE TEST or  MEASUREMENT: Dynamic Gait Index  Level Surface Mild Impairment   Change in Gait Speed Mild Impairment   Gait with Horizontal Head Turns Mild Impairment   Gait with Vertical Head Turns Mild Impairment   Gait and Pivot Turn Mild Impairment   Step Over Obstacle Mild Impairment   Step Around Obstacles Mild Impairment   Steps Moderate Impairment   Total Score 15   DGI comment: Scores of 19 or less are predictive of falls in older community living adults      GAIT TRAINING: To normalize gait pattern and improve safety with 4WW/rollator.  = 8.75 sec with RW Gait speed = 3.75 ft/sec with RW Gait training within clinic with 4WW/rollator: Focusing on posture and walker proximity for improved directional control while turning and negotiating obstacles.   Discussed benefits and drawbacks of use of 4WW as compared to RW in relation to stability and ease of mobility particularly on uneven surfaces - Will plan to introduce 4 WW with outdoor surfaces within next few visits weather permitting. Provided education on safe use of 4WW/rollator brakes during sit to stand transitions and to control speed of walker during gait.   01/06/2024  THERAPEUTIC EXERCISE: To improve strength and endurance.   NuStep - L6 x 6 min - UE/LE to promote reciprocal movement patterns  NEUROMUSCULAR RE-EDUCATION: To improve coordination, kinesthesia, posture, amplitude of movement, speed of movement to reduce bradykinesia, and reduce rigidity.  Prone PWR! Moves (2 pillows under abdomen/hips to reduce low back strain): Up x 10 Rock x 10 Twist x 10 bil Step x 10   01/02/2024  THERAPEUTIC EXERCISE: To improve strength and endurance.   NuStep - L6 x 6 min - UE/LE to promote reciprocal movement patterns  THERAPEUTIC ACTIVITIES: To improve functional performance.  Demonstration, verbal and tactile cues throughout for technique. Reviewed education on tips to prevent freezing with transfers and gait - simulated approach  from foyer into den navigating around furniture to his usual chair where he was experiencing the freezing yesterday.    NEUROMUSCULAR RE-EDUCATION: To improve coordination, kinesthesia, posture, proprioception, amplitude of movement, speed of movement to reduce bradykinesia, and reduce rigidity. Supine PWR! Moves: Up x 10 Rock x 10 Twist x 10 Step x 10    12/30/2023  THERAPEUTIC EXERCISE: To improve strength and endurance.    Rec Bike - L3 x 6 min  SELF CARE: Provided education to reduce fall risk and to promote safe home environment. Reviewed the Check for Safety - Home Fall Prevention Checklist for Older Adults to help identify fall risk hazards in the home along with strategies to reduce fall risk  at home  Reviewed education on tips to prevent freezing with transfers and gait.    NEUROMUSCULAR RE-EDUCATION: To improve balance, coordination, kinesthesia, posture, proprioception, reduce fall risk, amplitude of movement, speed of movement to reduce bradykinesia, and reduce rigidity. Seated PWR! Moves: Up x 10 Rock x 10 Twist x 10 Step x 10 PWR! Sit to stand x 10 Standing PWR! Moves: (standing in front on mat table with chair in front for intermittent UE support as needed) Up x 10 Rock x 10 Twist x 10 Step x 10    12/26/2023 THERAPEUTIC EXERCISE: To improve strength, endurance, and flexibility.  Demonstration, verbal and tactile cues throughout for technique.  NuStep - L6 x 6 min - UE/LE to promote reciprocal movement patterns Seated hip hinge HS stretch x 30 bil Seated lunge position hip flexor/quad stretch x 30 bil Seated GTB HS curls x 10 bil Seated GTB LAQ x 10 bil  MANUAL THERAPY: To promote normalized muscle tension, improved flexibility, pain modulation, and reduced pain utilizing connective tissue massage, therapeutic massage, and myofascial release. Foam roller to B quads x ~2 min Roller stick to R/L hamstrings x 1-2 min  NEUROMUSCULAR RE-EDUCATION: To improve  balance, coordination, kinesthesia, posture, proprioception, reduce fall risk, and amplitude of movement. Standing 4-way GTB SLR with RW support for balance x 10   12/23/2023  THERAPEUTIC EXERCISE: To improve strength and endurance.  Demonstration, verbal and tactile cues throughout for technique.  NuStep - L6 x 6 min Seated GTB B scap retraction + shoulder row x 10  Seated GTB B scap retraction + shoulder extension x 10  Seated bent over scap retraction + B shoulder extension x 10 with 5# db bilaterally Seated GTB hip ABD/ER clam 2 x 10 Seated GTB hip flexion march x 10  NEUROMUSCULAR RE-EDUCATION: To improve balance, coordination, kinesthesia, posture, proprioception, reduce fall risk, amplitude of movement, speed of movement to reduce bradykinesia, and reduce rigidity. Seated PWR! Moves: Up x 10 Rock x 10 Twist x 10 Step x 10 Standing hip extension with looped GTB at ankles x 10, UE support on RW (encouraged counter support at home) Standing hip abduction with looped GTB at ankles x 10, UE support on RW  THERAPEUTIC ACTIVITIES: To improve functional performance.  Demonstration, verbal and tactile cues throughout for technique. Sit to stand from Airex pad in chair w/o UE assist + GTB hip ABD isometric at distal thighs x 10   12/19/23 Nustep L5x64min UE/LE Seated Lumbar flexion stretch green pball x 30' 3 way Seated hamstring stretch x 30' BLE Seated figure 4 stretch x 30 BLE Seated side bend stretch x 30' B Standing runner stretch x 30' Seated thoracic ext HBH 5x10 Standing pallof press GTB doubled x 10  Church pew weight shifts x 10   12/13/23 Nustep level 5 x 6 minutes TUG =  23 seconds, unsteady with turn no device but did touch wall 5XSTS = 23 seconds  really back on heels Berg = 41/56 On airex standing On airex ball toss, had to have another person for CGA tended to lose balance to the back Step over side to sided and forward back PVC T CGA Direction  changes   11/19/2023  SELF CARE:  Reviewed eval findings and role of PT in addressing identified deficits as well as need for further assessment of balance.  Provided education on safe hand placement with RW during transfers to reduce fall risk.   PATIENT EDUCATION:  Education details: standardized testing results and interpretation,  progress with PT, transfer safety, and gait safety with 4WW/rollator  Person educated: Patient Education method: Explanation, Demonstration, and Verbal cues Education comprehension: verbalized understanding, returned demonstration, verbal cues required, and needs further education  HOME EXERCISE PROGRAM: Access Code: [email or web address] URL: [email or web address] Date: 12/30/2023 Prepared by: Elijah Hidden  Exercises - Supine Lower Trunk Rotation  - 2 x daily - 7 x weekly - 2 sets - 10 reps - 10 sec hold - Seated Thoracic Lumbar Extension with Pectoralis Stretch  - 2 x daily - 7 x weekly - 2 sets - 10 reps - 5 sec hold - Seated Hamstring Stretch  - 2 x daily - 7 x weekly - 3 reps - 30 sec hold - Seated Figure 4 Piriformis Stretch  - 2 x daily - 7 x weekly - 3 reps - 30 sec hold - Standing Gastroc Stretch at Counter  - 2 x daily - 7 x weekly - 3 reps - 30 sec hold - Seated Quadratus Lumborum Stretch in Chair  - 2 x daily - 7 x weekly - 3 reps - 30 sec hold - Seated Flexion Stretch with Swiss Ball  - 2 x daily - 7 x weekly - 3 reps - 30 sec hold - Seated Thoracic Flexion and Rotation with Swiss Ball  - 2 x daily - 7 x weekly - 3 reps - 30 sec hold - Seated Bent Over Shoulder Row with Dumbbells  - 1 x daily - 3 x weekly - 2 sets - 10 reps - 3 sec hold - Seated Shoulder Extension with Dumbbells  - 1 x daily - 3 x weekly - 2 sets - 10 reps - 3 sec hold - Standing Hip Extension with Resistance at Ankles and Counter Support  - 1 x daily - 3 x weekly - 2 sets - 10 reps - 3 sec hold - Standing Hip Abduction with Resistance at Ankles and Counter Support  - 1 x  daily - 3 x weekly - 2 sets - 10 reps - 3 sec hold  Patient Education - Tips to reduce freezing episodes with standing or walking - Check for Safety  PWR! Moves: - Sitting - Standing - Supine - Prone   ASSESSMENT:  CLINICAL IMPRESSION: [place] is demonstrating good progress with PT with gains noted across all standardized tests.  5xSTS improved from 23 seconds to 14.41 seconds and gait speed has improved from 1.41 ft/sec to 3.75 ft/sec, meeting associated LTG's #5 and 4.  Vincent Walker has improved by 5 points from 41/56 to 46/56 indicating a reduction in fall risk from significant (>80%) to moderate (>50%) risk for falls.  ABC scale has improved from 55.0% on eval to 72.5% currently indicating improving balance confidence and increasing level of physical functioning.  Patient recently introduced to 4WW/rollator during therapy session and liked walker well enough that he ordered one from [place] which he brought with him to therapy today therefore reviewed safety with proper use of walker during transfers as well as ambulation indoors and outdoors including curb negotiation and unlevel surfaces.  Patient able to demonstrate safe use of 4WW/rollator but cues necessary for increased foot clearance and heel strike on weight acceptance to reduce scuffing/shuffling of gait.  Vincent Walker is progressing well towards his PT goals with all STG's now met as well as above indicated LTG's.  Vincent Walker will benefit from continued skilled PT to address remaining postural, strength and balance deficits to improve mobility and activity tolerance with decreased pain interference and decreased risk for  falls.   EVAL: Vincent Walker is a 78 y.o. male who was referred to physical therapy for evaluation and treatment for Parkinson's disease.  Patient first diagnosed with Parkinson's 5+ years ago.  He has completed 2 prior PT episodes in 2022/2023 and 2024 within the [facility] system.  Most recent PT episode for Parkinson's disease was  09/25/2022 - 11/20/2022.  Since his last PT episode, he reports worsening of his PD leading to increased gait impairments and need to switch from single-point cane to RW/2WW.  Patient presents with physical impairments of decreased timing and coordination of gait, impaired ambulation, impaired standing balance, abnormal posture, bradykinesia with transfers, impaired activity tolerance, LE weakness, postural instability and decreased safety awareness impacting safe and independent functional mobility.  Examination revealed patient is at risk for falls and functional decline as evidenced by the following objective test measures: Gait speed 1.41 ft/sec, (2.62 ft/sec is needed for community access and <1.8 ft/sec is indicative of risk for recurrent falls), which demonstrates a significant decline from gait speed of.  He reports increased incidence of freezing of gait, most common in the morning and subsiding as the day progresses.  He has had 2 falls in the past 6 months, both of which occurred while turning with the RW.  Further balance testing with 5xSTS, TUG, Berg and/or DGI/FGA indicated however patient needing to use the bathroom preventing testing today.  ABC scale score of 55% indicates a moderate level of physical functioning, and score of <69% indicates risk for recurrent falls in PD.  Vincent Walker will benefit from skilled PT to address above deficits to improve mobility and activity tolerance to help reach the maximal level of functional independence with mobility and gait with reduced risk for falls.  Patient and his wife demonstrates understanding of this POC and are in agreement with this plan.   OBJECTIVE IMPAIRMENTS: Abnormal gait, decreased activity tolerance, decreased balance, decreased coordination, decreased knowledge of condition, decreased knowledge of use of DME, decreased mobility, difficulty walking, decreased ROM, decreased strength, decreased safety awareness, increased fascial restrictions,  impaired perceived functional ability, impaired flexibility, improper body mechanics, postural dysfunction, and pain.   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   REHAB POTENTIAL: Good  CLINICAL DECISION MAKING: Unstable/unpredictable  EVALUATION COMPLEXITY: High   GOALS: Goals reviewed with patient? Yes  SHORT TERM GOALS: Target date: 01/09/2024  Patient will be independent with initial HEP. Baseline: TBD 12/23/23 - Pt reports no concerns with current HEP exercises Goal status: MET - 01/09/24  2.  Patient will demonstrate decreased fall risk by scoring < 25 sec on TUG. Baseline: TBA Goal status: MET - 12/13/23 - 23 seconds  3.  Patient will be educated on strategies to decrease risk of falls.  Baseline:  Goal status: MET - 12/30/23  4.  Patient will verbalize tips to reduce freezing/festination with gait and turns. Baseline: Patient reports freezing episodes most common in the morning, subsiding as the day progresses. 12/30/23 - reviewed today 01/02/24 - simulated home navigation in places where he is more  likely to experiencing freezing Goal status: MET - 01/14/24  LONG TERM GOALS: Target date: 02/20/2024  Patient will be independent with ongoing/advanced HEP for self-management at home incorporating PWR! Moves as indicated .  Baseline:  Goal status: IN PROGRESS - 01/06/24  2.  Patient will be able to  ambulate 600' with LRAD with good safety to access community.  Baseline:  Goal status: IN PROGRESS - 01/14/24 - new 4WW/rollator introduced today - cues necessary for safe transfer technique with walker as well as upright posture and proper foot placement between rear wheels of walker with cues for heelstrike on weight acceptance to increase foot clearance  3.  Patient will be able to step up/down curb safely with LRAD for safety with community ambulation.  Baseline:  Goal status: IN PROGRESS - 01/14/24 - instructions provided for safe approach and negotiation of curb with 4WW/rollator including proper use of brakes during transition up/down  4.  Patient will demonstrate gait speed of >/= 1.8 ft/sec (0.55 m/s) to be a safe limited community ambulator with decreased risk for recurrent falls.  Baseline: 1.41 ft/sec with RW Goal status: MET - 01/09/24 - 3.75 ft/sec with RW  5.  Patient will improve 5xSTS time to </= 16 seconds to demonstrate improved functional strength and transfer efficiency. Baseline: 12/13/23 - 23 seconds  Goal status: MET - 01/14/24 - 14.41 sec  6.  Patient will demonstrate at least 19/24 on DGI or 19/30 on FGA to improve gait stability and reduce risk for falls. Baseline: 01/09/24 - DGI = 15/24 Goal status: IN PROGRESS - 01/09/24  7.  Patient will improve Berg score by at least 8 points to improve safety and stability with ADLs in standing and reduce risk for falls.   Baseline: 12/13/23 - 41/56 Goal status: IN PROGRESS - 01/14/24 - 46/56  8.  Patient will report >/= 69% on ABC scale to demonstrate improved balance confidence and decreased risk for falls. Baseline: 880 / 1600 = 55.0 % Goal status: MET - 01/14/24 - 1160 / 1600 = 72.5 %  9. Patient will verbalize understanding of local Parkinson's disease community resources, including community fitness post d/c. Baseline:  Goal status: INITIAL   PLAN:  PT FREQUENCY: 2x/week  PT DURATION: 12 weeks  PLANNED  INTERVENTIONS: 97164- PT Re-evaluation, 97750- Physical Performance Testing, 97110-Therapeutic exercises, 97530- Therapeutic activity, [id number]- Neuromuscular re-education, 97535- Self Care, [id number]- Manual therapy, [phone]- Gait training, [phone]- Electrical stimulation (unattended), 97035- Ultrasound, 02966- Ionotophoresis 4mg /ml Dexamethasone , 79439 (1-2 muscles), 20561 (3+ muscles)- Dry Needling, Patient/Family education, Balance training, Stair training, Taping, Joint mobilization, Spinal mobilization, Cryotherapy, and Moist heat  PLAN FOR NEXT SESSION: Review 4WW/rollator safety as indicated including proper gait pattern PRN; LE flexibility; core/postural strengthening; review seated, standing, supine and prone PWR! Moves PRN and re-introduce remaining position (quadruped) as tolerated; review education on tips to reduce freezing as indicated   Elijah CHRISTELLA Hidden, PT 01/14/2024, 6:16 PM

## 2024-01-20 ENCOUNTER — Ambulatory Visit: Admitting: Physical Therapy

## 2024-01-20 ENCOUNTER — Encounter: Payer: Self-pay | Admitting: Physical Therapy

## 2024-01-20 DIAGNOSIS — M6281 Muscle weakness (generalized): Secondary | ICD-10-CM | POA: Diagnosis not present

## 2024-01-20 DIAGNOSIS — R2689 Other abnormalities of gait and mobility: Secondary | ICD-10-CM | POA: Diagnosis not present

## 2024-01-20 DIAGNOSIS — R296 Repeated falls: Secondary | ICD-10-CM | POA: Diagnosis not present

## 2024-01-20 DIAGNOSIS — G20A2 Parkinson's disease without dyskinesia, with fluctuations: Secondary | ICD-10-CM

## 2024-01-20 DIAGNOSIS — R2681 Unsteadiness on feet: Secondary | ICD-10-CM

## 2024-01-20 NOTE — Therapy (Signed)
 OUTPATIENT PHYSICAL THERAPY PARKINSON'S TREATMENT    Patient Name: Vincent Walker MRN: [id number] DOB:04-Jun-1945, 78 y.o., male Today's Date: 01/20/2024   END OF SESSION:  PT End of Session - 01/20/24 1316     Visit Number 11    Date for Recertification  02/20/24    Authorization Type Medicare & Federal BCBS    Progress Note Due on Visit 20    PT Start Time 1316    PT Stop Time 1358    PT Time Calculation (min) 42 min    Activity Tolerance Patient tolerated treatment well    Behavior During Therapy [facility] for tasks assessed/performed;Flat affect               Past Medical History:  Diagnosis Date   Adenomatous polyp of ascending colon 10/09/2018   Allergies 07/07/2018   Arthritis    Astigmatism of both eyes with presbyopia 08/21/2022   Blepharitis of upper and lower eyelids of both eyes 01/10/2016   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 Required nasal packing a couple of days ago.  Had a similar episode of epistaxis a little over a year ago.  At that visit, I could not see the exact bleeding spot. EXAMINATION after packing removal reveals several excoriated areas anteriorly but I was unable to see t   Essential hypertension 12/25/2011   Eustachian tube dysfunction, left 02/21/2021   Flank pain 07/19/2022   History of blood transfusion 2010   After Kidney surgery   History of chicken pox    History of renal cell carcinoma 02/08/2015   diagnosed and removed in 2010 Right Monitored by Dr Tanda Moats of urology at [facility] Dr Claudine Alley, nephrology at [facility]   Hyperglycemia 12/23/2015   Increased thyroid  stimulating hormone (TSH) level 06/07/2017   Ingrown left big  toenail 02/14/2015   Left foot pain 02/14/2015   MVP (mitral valve prolapse) 05/14/2016   Parkinson's disease (HCC) 12/23/2015   Posterior vitreous detachment of both eyes 08/21/2022   Primary open-angle glaucoma, left eye, severe stage 05/05/2021   Primary open-angle glaucoma, right eye, moderate stage 01/10/2016   Pseudophakia, both eyes 08/21/2022   Right hip pain 12/12/2016   Stage 3 chronic kidney disease 12/25/2011   Thrombocytopenia 02/08/2015   Tremor 02/14/2015   Past Surgical History:  Procedure Laterality Date   APPENDECTOMY  1995   CATARACT EXTRACTION, BILATERAL     COLONOSCOPY     COLONOSCOPY WITH PROPOFOL  N/A 12/17/2018   Procedure: COLONOSCOPY WITH PROPOFOL ;  Surgeon: Wilhelmenia Aloha Raddle., MD;  Location: [facility] ENDOSCOPY;  Service: Gastroenterology;  Laterality: N/A;   ENDOSCOPIC MUCOSAL RESECTION N/A 12/17/2018   Procedure: ENDOSCOPIC MUCOSAL RESECTION;  Surgeon: Wilhelmenia Aloha Raddle., MD;  Location: [facility] ENDOSCOPY;  Service: Gastroenterology;  Laterality: N/A;   HEMOSTASIS CLIP PLACEMENT  12/17/2018   Procedure: HEMOSTASIS CLIP PLACEMENT;  Surgeon: Wilhelmenia Aloha Raddle., MD;  Location: [facility] ENDOSCOPY;  Service: Gastroenterology;;   left knee scope  2003   POLYPECTOMY  12/17/2018   Procedure: POLYPECTOMY;  Surgeon: Wilhelmenia Aloha Raddle., MD;  Location: [facility] ENDOSCOPY;  Service: Gastroenterology;;   right knee scope  1999   SUBMUCOSAL LIFTING INJECTION  12/17/2018   Procedure: SUBMUCOSAL LIFTING INJECTION;  Surgeon: Wilhelmenia,  Aloha Raddle., MD;  Location: [facility] ENDOSCOPY;  Service: Gastroenterology;;   TOE SURGERY Left    metal 2nd toe- and top of foot- straighten bone   TONSILLECTOMY     TOTAL KNEE ARTHROPLASTY Left 07/06/2014   TOTAL NEPHRECTOMY Right    Patient Active Problem List   Diagnosis Date Noted   Cellulitis of left foot 09/17/2023   Dementia due to Parkinson's disease 11/20/2022   Dry eye syndrome of both eyes 08/28/2022   Astigmatism of both eyes with  presbyopia 08/21/2022   Choroidal nevus of right eye 08/21/2022   Posterior vitreous detachment of both eyes 08/21/2022   Pseudophakia, both eyes 08/21/2022   Primary open-angle glaucoma, left eye, severe stage 05/05/2021   Eustachian tube dysfunction, left 02/21/2021   Early dry stage nonexudative age-related macular degeneration of both eyes 03/09/2019   Adenomatous polyp of ascending colon 10/09/2018   Epistaxis 07/07/2018   Allergies 07/07/2018   Obesity 06/15/2018   Increased thyroid  stimulating hormone (TSH) level 06/07/2017   MVP (mitral valve prolapse) 05/14/2016   Blepharitis of upper and lower eyelids of both eyes 01/10/2016   Primary open-angle glaucoma, right eye, moderate stage 01/10/2016   Parkinson's disease (HCC) 12/23/2015   Hyperglycemia 12/23/2015   Tremor 02/14/2015   Dyslipidemia 02/08/2015   History of renal cell carcinoma 02/08/2015   Thrombocytopenia 02/08/2015   History of chicken pox    Essential hypertension 12/25/2011   Stage 3 chronic kidney disease 12/25/2011    PCP: Vincent Harlene LABOR, MD   REFERRING PROVIDER: Evonnie Asberry RAMAN, DO   REFERRING DIAG: G20.A2 (ICD-10-CM) - Parkinson's disease without dyskinesia, with fluctuating manifestations (HCC)   THERAPY DIAG:  Parkinson's disease without dyskinesia, with fluctuations (HCC)  Other abnormalities of gait and mobility  Unsteadiness on feet  Muscle weakness (generalized)  Repeated falls  RATIONALE FOR EVALUATION AND TREATMENT: Rehabilitation  ONSET DATE: PD diagnosis 5+ years ago   NEXT MD VISIT: 04/14/24   SUBJECTIVE:                                                                                                                                                                                                         SUBJECTIVE STATEMENT: Pt reports things are going well with the new 4WW/rollator.  EVAL:  Pt reports worsening of his Parkinson's disease. His biggest concern is that he is  having a harder time walking and had to switch to a 2WW/RW from his cane.  He states his wife also notes more forgetfulness/memory issues. He recognizes increased freezing of gait, typcially worse in  the mornings and lessening as the day progresses.  He is worried about falling and injuring himself to the point where he will need someone to take care of him.  Pt accompanied by: significant other - wife Vincent Walker) in waiting room   PAIN: Are you having pain? 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 Yes. Number of falls 2 (both while using RW to go around a corner)  LIVING ENVIRONMENT: Lives with: lives with their spouse Lives in: House/apartment Stairs: No Has following equipment at home: Single point cane, Environmental consultant - 2 wheeled, Grab bars, and Recumbent Bike  OCCUPATION: Retired  PLOF: Independent with household mobility with device, Independent with community mobility with device, Needs assistance with homemaking, and Leisure: mostly sedentary recently     PATIENT GOALS: To get myself going to help bring back some of what I've lost.   OBJECTIVE: (objective measures completed at initial evaluation unless otherwise dated)  DIAGNOSTIC FINDINGS:  09/15/23 - CT CERVICAL SPINE WITHOUT CONTRAST (s/p fall, hit right-side) IMPRESSION: 1. No acute cervical spine fracture. 2. Extensive multilevel cervical degenerative changes.  09/15/23 - CT HEAD WITHOUT CONTRAST (s/p fall, hit right-side) IMPRESSION: 1. No acute intracranial process.  09/15/23 - RIGHT SHOULDER - 2+ VIEW (s/p fall, hit right-side) IMPRESSION: 1. No acute fracture. 2. Bone hypertrophy likely from remote  trauma to the distal clavicle. Heterotopic bone formation likely from remote trauma to the coracoclavicular ligament. 3. Mild high-riding humeral head which may be secondary to a superior rotator cuff tear. 4. Mild glenohumeral osteoarthritis.  09/15/23 - LEFT FOOT - COMPLETE 3+ VIEW (L foot pain) IMPRESSION: 1. No significant change from prior.  No acute fracture. 2. Mild hallux valgus. 3. Postsurgical changes of second and third PIP arthrodesis.  09/29/23 - MR FOOT LEFT W WO CONTRAST FINDINGS: Postoperative change with metal artifact to the distal second, third, and fourth digits. No fracture or dislocation. No erosions. Mild second tarsometatarsal joint osteoarthritis with joint space narrowing and mild degenerative edema. The visualized marrow signal is otherwise unremarkable.   No obvious soft tissue wound/ulceration. This may be obscured by metal artifact. Moderate diffuse muscle atrophy. Mild likely reactive myositis. The tendons are unremarkable. Moderate to severe dorsal subcutaneous edema. No organized fluid collection.   IMPRESSION: The exam is slightly limited by metal artifact.   Moderate to severe dorsal subcutaneous edema. Correlate for lymphedema versus cellulitis.   No obvious soft tissue wound/ulceration or evidence of osteomyelitis. If continued clinical concern, CT scan and/or bone scan could be performed to further characterize. This would limit the metal artifact.   Diffuse muscle atrophy.   Mild degenerative change.  COGNITION: Overall cognitive status: Impaired - decreased short-term memory and recall of new information    SENSATION: WFL  COORDINATION: B heel-toe mildly diminished. Impaired heel to shin bilaterally.  MUSCLE TONE: Increased LE muscle tone noted during attempts at PROM/flexibility assessment.   POSTURE:  rounded shoulders, forward head, increased thoracic kyphosis, and flexed trunk   MUSCLE LENGTH: Hamstrings: mod tight B ITB:  mild tight B Piriformis: mod/severe tight B Hip flexors: mod/severe tight B Quads: mild/mod tight B Heelcord: mild tight B  LOWER EXTREMITY ROM:    Mildly  limited B hip and ankle ROM (R>L), mostly due to limited muscle flexibility as above.   LOWER EXTREMITY MMT:    MMT Right eval Left eval  Hip flexion 4+ 4+  Hip extension 4- 4  Hip abduction 4 4  Hip adduction 4+ 4+  Hip internal rotation 5 5  Hip external rotation 4+ 4+  Knee flexion 4+ 4+  Knee extension 4+ 4+  Ankle dorsiflexion 4+ 4+  Ankle plantarflexion    Ankle inversion    Ankle eversion    (Blank rows = not tested)  BED MOBILITY:  Minimal assist overall  TRANSFERS: Assistive device utilized: Environmental consultant - 2 wheeled  Sit to stand: Modified independence and SBA - cues to push up from chair rather than pull up on walker Stand to sit: Modified independence Chair to chair: NT Floor: NT  GAIT: Distance walked: Clinic distances Assistive device utilized: Environmental consultant - 2 wheeled Level of assistance: Modified independence Gait pattern: step through pattern, decreased stride length, shuffling, festinating, and trunk flexed Comments: Patient reports intermittent freezing episodes, most common in the morning and subsiding as the day progresses  FUNCTIONAL TESTS:  5 times sit to stand: 12/13/23 - 23 seconds back on heels Timed up and go (TUG): 12/13/23 - no device 23 seconds lost balance with turn Manual =  Cognitive =  10 meter walk test: 23.25 sec with 2WW Gait Speed: 1.41 ft/sec w/o AD, limited community ambulator; gait speed of <1.8 ft/sec indicates the risk for recurrent falls  Berg Balance Scale: 12/13/23 - 41/56, 37-45 = Significant (>80%) fall risk  Functional gait assessment: TBA Interpretation of FGA scores: Non-Specific Older Adults Cutoff Score: <=22/30 = risk of falls Parkinson's Disease Cutoff score <15/30= fall risk (Hoehn & Yahr 1-4)  Minimally Clinically Important Difference (MCID)  Stroke (acute, subacute,  and chronic) = MDC: 4.2 points Vestibular (acute) = MDC: 6 points Community Dwelling Older Adults =  MCID: 4 points Parkinson's Disease  =  MDC: 4.3 points  (Academy of Neurologic Physical Therapy (nd). Functional Gait Assessment. Retrieved from https://www.neuropt.org/docs/default-source/cpgs/core-outcome-measures/function-gait-assessment-pocket-guide-proof9-(2).pdf?sfvrsn=b45f35043_0.)  Baseline as of D/C from last PT episode (11/20/22): 11/15/22: 5xSTS = 13.88 sec w/o UE assist (occasional hands on knees) TUG:  Normal = 9.93 sec Manual = 10.19 sec Cognitive = 13.16 sec (unable to complete cognitive task) : w/o AD = 10.62 sec, 9.32 sec (fastest comfortable speed w/o AD) with SPC = 11.59 sec with B hiking poles = 11.62 sec Gait speed: 3.09 ft/sec w/o AD  3.52 ft/sec (fastest comfortable speed w/o AD) 2.83 ft/sec with SPC  2.82 ft/sec with B hiking poles    11/20/22: Berg = 50/56, 46-51 moderate risk for falls (>50%)  FGA = 21/30, 19-24 = medium risk for fall   PATIENT SURVEYS:  ABC scale: The Activities-Specific Balance Confidence (ABC) Scale 0% 10 20 30  40 50 60 70 80 90 100% No confidence<->completely confident  "How confident are you that you will not lose your balance or become unsteady when you . . .  Date tested 11/28/23 *confidence with RW 01/14/24 *confidence with RW/4WW  Walk around the house 80% 90%  2. Walk up or down stairs 50% 80%  3. Bend over and pick up a slipper from in front of a closet floor 70% 80%  4. Reach for a small can off a shelf at eye level 100% 100%  5. Stand on tip toes and reach for something above your head 60% 70%  6. Stand on a chair and reach for something 0%  20%  7. Sweep the floor 0% 60%  8. Walk outside the house to a car parked in the driveway 80% 100%  9. Get into or out of a car 90% 100%  10. Walk across a parking lot to the mall 70% 90%  11. Walk up or down a ramp 70% 80%  12. Walk in a crowded mall where people rapidly walk  past you 60% 90%  13. Are bumped into by people as you walk through the mall 70% 80%  14. Step onto or off of an escalator while you are holding onto the railing 80% 90%  15. Step onto or off an escalator while holding onto parcels such that you cannot hold onto the railing 0% 30%  16. Walk outside on icy sidewalks 0% 0%  Total: #/16 880 / 1600 = 55.0 % 1160 / 1600 = 72.5 %  50-80% = moderate level of physical functioning <69% indicates risk for recurrent falls in PD   TODAY'S TREATMENT:   01/20/2024  THERAPEUTIC EXERCISE: To improve strength and endurance.    Rec Bike - L3 x 6 min  NEUROMUSCULAR RE-EDUCATION: To improve balance, coordination, kinesthesia, posture, proprioception, reduce fall risk, amplitude of movement, speed of movement to reduce bradykinesia, and reduce rigidity.  Quadruped PWR! Moves: Up x 10 Rock x 10 Twist x 10 - repeated cues for sequencing  Step - unable  PWR! Stepping: Forward x 10 bil with single UE support on counter Forward x 10 bil w/o UE support - 1 LOB requiring PT assist to correct Backward x 10 bil with single UE support on counter Step-through forward & backward x 10 bil with single UE support on counter B side-stepping along counter but no need for UE support 5 x 10' Step, twist and reach x 10 to alternating sides keeping 1 hand on counter   01/14/2024 - 10th visit PN THERAPEUTIC EXERCISE: To improve strength and endurance.   NuStep - L6 x 6 min - UE/LE to promote reciprocal movement patterns  THERAPEUTIC ACTIVITIES: To improve functional performance.  Demonstration, verbal and tactile cues throughout for technique. ABC scale: 1160 / 1600 = 72.5 %, indicates moderate level physical functioning 5xSTS = 14.41 sec w/o UE assist   PHYSICAL PERFORMANCE TEST or MEASUREMENT: Berg Balance Test  Sit to Stand Able to stand without using hands and stabilize independently   Standing Unsupported Able to stand safely 2 minutes   Sitting with Back  Unsupported but Feet Supported on Floor or Stool Able to sit safely and securely 2 minutes   Stand to Sit Sits safely with minimal use of hands   Transfers Able to transfer safely, minor use of hands   Standing Unsupported with Eyes Closed Able to stand 10 seconds safely   Standing Unsupported with Feet Together Able to place feet together independently and stand 1 minute safely   From Standing, Reach Forward with Outstretched Arm Can reach forward >12 cm safely (5)   From Standing Position, Pick up Object from Floor Able to pick up shoe safely and easily   From Standing Position, Turn to Look Behind Over each Shoulder Looks behind one side only/other side shows less weight shift   Turn 360 Degrees Able to turn 360 degrees safely but slowly   Standing Unsupported, Alternately Place Feet on Step/Stool Able to stand independently and complete 8 steps >20 seconds   Standing Unsupported, One Foot in Front Able to take small step independently and hold 30 seconds   Standing  on One Leg Tries to lift leg/unable to hold 3 seconds but remains standing independently   Total Score 46   Berg comment: 46-51 = Moderate (>50%) fall risk       SELF CARE: Provided education to improve safety with use of 4WW/rollator for assistive device and to reduce fall risk.  Patient arrived to PT today with new recently purchased 4WW/rollator today (just set up last night).  Provided cues/instruction in safe use of 4WW/rollator including: Safe transfer technique with walker  Need to lock brakes prior to initiation of transfer  Proper hand placement on seating surface/armrest of chair during both sit to stand and stand to sit transition Upright posture and proper foot placement between rear wheels of walker during gait  GAIT TRAINING: To normalize gait pattern and improve safety with 4WW/rollator.  600' with 4 WW/rollator on level surfaces indoors as well as level and uneven surfaces outdoors including sidewalks,  inclines, pavement and grass - repeated cues for upright posture and proper foot placement between rear wheels of walker as well as for heelstrike on weight acceptance to increase foot clearance and reduce scuffing/shuffling of feet Curb negotiation - instructions provided for safe approach and negotiation of curb with 4WW/rollator including proper use of brakes during transition up/down   01/09/2024  THERAPEUTIC EXERCISE: To improve strength and endurance.   NuStep - L6 x 6 min - UE/LE to promote reciprocal movement patterns  PHYSICAL PERFORMANCE TEST or MEASUREMENT: Dynamic Gait Index  Level Surface Mild Impairment   Change in Gait Speed Mild Impairment   Gait with Horizontal Head Turns Mild Impairment   Gait with Vertical Head Turns Mild Impairment   Gait and Pivot Turn Mild Impairment   Step Over Obstacle Mild Impairment   Step Around Obstacles Mild Impairment   Steps Moderate Impairment   Total Score 15   DGI comment: Scores of 19 or less are predictive of falls in older community living adults      GAIT TRAINING: To normalize gait pattern and improve safety with 4WW/rollator.  = 8.75 sec with RW Gait speed = 3.75 ft/sec with RW Gait training within clinic with 4WW/rollator: Focusing on posture and walker proximity for improved directional control while turning and negotiating obstacles.   Discussed benefits and drawbacks of use of 4WW as compared to RW in relation to stability and ease of mobility particularly on uneven surfaces - Will plan to introduce 4 WW with outdoor surfaces within next few visits weather permitting. Provided education on safe use of 4WW/rollator brakes during sit to stand transitions and to control speed of walker during gait.   01/06/2024  THERAPEUTIC EXERCISE: To improve strength and endurance.   NuStep - L6 x 6 min - UE/LE to promote reciprocal movement patterns  NEUROMUSCULAR RE-EDUCATION: To improve coordination, kinesthesia, posture, amplitude  of movement, speed of movement to reduce bradykinesia, and reduce rigidity.  Prone PWR! Moves (2 pillows under abdomen/hips to reduce low back strain): Up x 10 Rock x 10 Twist x 10 bil Step x 10   01/02/2024  THERAPEUTIC EXERCISE: To improve strength and endurance.   NuStep - L6 x 6 min - UE/LE to promote reciprocal movement patterns  THERAPEUTIC ACTIVITIES: To improve functional performance.  Demonstration, verbal and tactile cues throughout for technique. Reviewed education on tips to prevent freezing with transfers and gait - simulated approach from foyer into den navigating around furniture to his usual chair where he was experiencing the freezing yesterday.    NEUROMUSCULAR RE-EDUCATION:  To improve coordination, kinesthesia, posture, proprioception, amplitude of movement, speed of movement to reduce bradykinesia, and reduce rigidity. Supine PWR! Moves: Up x 10 Rock x 10 Twist x 10 Step x 10    12/30/2023  THERAPEUTIC EXERCISE: To improve strength and endurance.    Rec Bike - L3 x 6 min  SELF CARE: Provided education to reduce fall risk and to promote safe home environment. Reviewed the Check for Safety - Home Fall Prevention Checklist for Older Adults to help identify fall risk hazards in the home along with strategies to reduce fall risk at home  Reviewed education on tips to prevent freezing with transfers and gait.    NEUROMUSCULAR RE-EDUCATION: To improve balance, coordination, kinesthesia, posture, proprioception, reduce fall risk, amplitude of movement, speed of movement to reduce bradykinesia, and reduce rigidity. Seated PWR! Moves: Up x 10 Rock x 10 Twist x 10 Step x 10 PWR! Sit to stand x 10 Standing PWR! Moves: (standing in front on mat table with chair in front for intermittent UE support as needed) Up x 10 Rock x 10 Twist x 10 Step x 10    12/26/2023 THERAPEUTIC EXERCISE: To improve strength, endurance, and flexibility.  Demonstration, verbal and tactile  cues throughout for technique.  NuStep - L6 x 6 min - UE/LE to promote reciprocal movement patterns Seated hip hinge HS stretch x 30 bil Seated lunge position hip flexor/quad stretch x 30 bil Seated GTB HS curls x 10 bil Seated GTB LAQ x 10 bil  MANUAL THERAPY: To promote normalized muscle tension, improved flexibility, pain modulation, and reduced pain utilizing connective tissue massage, therapeutic massage, and myofascial release. Foam roller to B quads x ~2 min Roller stick to R/L hamstrings x 1-2 min  NEUROMUSCULAR RE-EDUCATION: To improve balance, coordination, kinesthesia, posture, proprioception, reduce fall risk, and amplitude of movement. Standing 4-way GTB SLR with RW support for balance x 10   12/23/2023  THERAPEUTIC EXERCISE: To improve strength and endurance.  Demonstration, verbal and tactile cues throughout for technique.  NuStep - L6 x 6 min Seated GTB B scap retraction + shoulder row x 10  Seated GTB B scap retraction + shoulder extension x 10  Seated bent over scap retraction + B shoulder extension x 10 with 5# db bilaterally Seated GTB hip ABD/ER clam 2 x 10 Seated GTB hip flexion march x 10  NEUROMUSCULAR RE-EDUCATION: To improve balance, coordination, kinesthesia, posture, proprioception, reduce fall risk, amplitude of movement, speed of movement to reduce bradykinesia, and reduce rigidity. Seated PWR! Moves: Up x 10 Rock x 10 Twist x 10 Step x 10 Standing hip extension with looped GTB at ankles x 10, UE support on RW (encouraged counter support at home) Standing hip abduction with looped GTB at ankles x 10, UE support on RW  THERAPEUTIC ACTIVITIES: To improve functional performance.  Demonstration, verbal and tactile cues throughout for technique. Sit to stand from Airex pad in chair w/o UE assist + GTB hip ABD isometric at distal thighs x 10   12/19/23 Nustep L5x47min UE/LE Seated Lumbar flexion stretch green pball x 30' 3 way Seated hamstring stretch x  30' BLE Seated figure 4 stretch x 30 BLE Seated side bend stretch x 30' B Standing runner stretch x 30' Seated thoracic ext HBH 5x10 Standing pallof press GTB doubled x 10  Church pew weight shifts x 10   12/13/23 Nustep level 5 x 6 minutes TUG =  23 seconds, unsteady with turn no device but did touch wall 5XSTS =  23 seconds  really back on heels Berg = 41/56 On airex standing On airex ball toss, had to have another person for CGA tended to lose balance to the back Step over side to sided and forward back PVC T CGA Direction changes   11/19/2023  SELF CARE:  Reviewed eval findings and role of PT in addressing identified deficits as well as need for further assessment of balance.  Provided education on safe hand placement with RW during transfers to reduce fall risk.   PATIENT EDUCATION:  Education details: continue with current HEP and PWR! Moves - quadruped  Person educated: Patient Education method: Explanation, Demonstration, and Verbal cues Education comprehension: verbalized understanding, returned demonstration, verbal cues required, and needs further education  HOME EXERCISE PROGRAM: Access Code: [email or web address] URL: [email or web address] Date: 12/30/2023 Prepared by: Elijah Hidden  Exercises - Supine Lower Trunk Rotation  - 2 x daily - 7 x weekly - 2 sets - 10 reps - 10 sec hold - Seated Thoracic Lumbar Extension with Pectoralis Stretch  - 2 x daily - 7 x weekly - 2 sets - 10 reps - 5 sec hold - Seated Hamstring Stretch  - 2 x daily - 7 x weekly - 3 reps - 30 sec hold - Seated Figure 4 Piriformis Stretch  - 2 x daily - 7 x weekly - 3 reps - 30 sec hold - Standing Gastroc Stretch at Counter  - 2 x daily - 7 x weekly - 3 reps - 30 sec hold - Seated Quadratus Lumborum Stretch in Chair  - 2 x daily - 7 x weekly - 3 reps - 30 sec hold - Seated Flexion Stretch with Swiss Ball  - 2 x daily - 7 x weekly - 3 reps - 30 sec hold - Seated Thoracic Flexion and Rotation  with Swiss Ball  - 2 x daily - 7 x weekly - 3 reps - 30 sec hold - Seated Bent Over Shoulder Row with Dumbbells  - 1 x daily - 3 x weekly - 2 sets - 10 reps - 3 sec hold - Seated Shoulder Extension with Dumbbells  - 1 x daily - 3 x weekly - 2 sets - 10 reps - 3 sec hold - Standing Hip Extension with Resistance at Ankles and Counter Support  - 1 x daily - 3 x weekly - 2 sets - 10 reps - 3 sec hold - Standing Hip Abduction with Resistance at Ankles and Counter Support  - 1 x daily - 3 x weekly - 2 sets - 10 reps - 3 sec hold  Patient Education - Tips to reduce freezing episodes with standing or walking - Check for Safety  PWR! Moves: - Sitting - Standing - Supine - Prone   ASSESSMENT:  CLINICAL IMPRESSION: Introduced final PWR! Move in quadruped with Vincent Walker able to perform Up, Rock and Twist with only cues needed for proper coordination of movement patterns, however limited flexibility preventing him from completing PWR! Step.  Remainder of session focusing on dynamic stepping utilizing PWR! stepping patterns in multiple directions to facilitate improved balance reactions and multidirectional stability.  Patient experiencing some difficulty coordinating LE movement patterns with reciprocal/coordinated arm movements with 1 LOB requiring PT assist to correct.  Vincent Walker will benefit from continued skilled PT to address ongoing postural, strength and balance deficits to improve mobility and activity tolerance with decreased pain interference and decreased risk for falls.   EVAL: Vincent Walker is a 78 y.o. male who was referred to  physical therapy for evaluation and treatment for Parkinson's disease.  Patient first diagnosed with Parkinson's 5+ years ago.  He has completed 2 prior PT episodes in 2022/2023 and 2024 within the [facility] system.  Most recent PT episode for Parkinson's disease was 09/25/2022 - 11/20/2022.  Since his last PT episode, he reports worsening of his PD leading to increased  gait impairments and need to switch from single-point cane to RW/2WW.  Patient presents with physical impairments of decreased timing and coordination of gait, impaired ambulation, impaired standing balance, abnormal posture, bradykinesia with transfers, impaired activity tolerance, LE weakness, postural instability and decreased safety awareness impacting safe and independent functional mobility.  Examination revealed patient is at risk for falls and functional decline as evidenced by the following objective test measures: Gait speed 1.41 ft/sec, (2.62 ft/sec is needed for community access and <1.8 ft/sec is indicative of risk for recurrent falls), which demonstrates a significant decline from gait speed of.  He reports increased incidence of freezing of gait, most common in the morning and subsiding as the day progresses.  He has had 2 falls in the past 6 months, both of which occurred while turning with the RW.  Further balance testing with 5xSTS, TUG, Berg and/or DGI/FGA indicated however patient needing to use the bathroom preventing testing today.  ABC scale score of 55% indicates a moderate level of physical functioning, and score of <69% indicates risk for recurrent falls in PD.  Vincent Walker will benefit from skilled PT to address above deficits to improve mobility and activity tolerance to help reach the maximal level of functional independence with mobility and gait with reduced risk for falls.  Patient and his wife demonstrates understanding of this POC and are in agreement with this plan.   OBJECTIVE IMPAIRMENTS: Abnormal gait, decreased activity tolerance, decreased balance, decreased coordination, decreased knowledge of condition, decreased knowledge of use of DME, decreased mobility, difficulty walking, decreased ROM, decreased strength, decreased safety awareness, increased fascial restrictions, impaired perceived functional ability, impaired flexibility, improper body mechanics, postural dysfunction,  and pain.   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   REHAB POTENTIAL: Good  CLINICAL DECISION MAKING: Unstable/unpredictable  EVALUATION COMPLEXITY: High   GOALS: Goals reviewed with patient? Yes  SHORT TERM GOALS: Target date: 01/09/2024  Patient will be independent with initial HEP. Baseline: TBD 12/23/23 - Pt reports no concerns with current HEP exercises Goal status: MET - 01/09/24  2.  Patient will demonstrate decreased fall risk by scoring < 25 sec on TUG. Baseline: TBA Goal status: MET - 12/13/23 - 23 seconds  3.  Patient will be educated on strategies to decrease risk of falls.  Baseline:  Goal status: MET - 12/30/23  4.  Patient will verbalize tips to reduce freezing/festination with gait and turns. Baseline: Patient reports freezing episodes most common in the morning, subsiding as the day progresses. 12/30/23 - reviewed today 01/02/24 - simulated home navigation in places where he is more likely to experiencing freezing Goal status: MET - 01/14/24  LONG TERM GOALS: Target date: 02/20/2024  Patient will be independent with ongoing/advanced HEP for self-management at home incorporating PWR! Moves as indicated .  Baseline:  Goal status: IN PROGRESS - 01/06/24  2.  Patient will be able to ambulate 600' with LRAD with good safety to access community.  Baseline:  Goal status: IN PROGRESS -  01/14/24 - new 4WW/rollator introduced today - cues necessary for safe transfer technique with walker as well as upright posture and proper foot placement between rear wheels of walker with cues for heelstrike on weight acceptance to increase foot clearance  3.  Patient will be able to step up/down curb safely with LRAD for safety with community ambulation.  Baseline:  Goal status: IN PROGRESS - 01/14/24 - instructions provided for safe approach and negotiation of curb with 4WW/rollator including proper use of brakes during transition up/down  4.  Patient will demonstrate gait speed of >/= 1.8 ft/sec (0.55 m/s) to be a safe limited community ambulator with decreased risk for recurrent falls.  Baseline: 1.41 ft/sec with RW Goal status: MET - 01/09/24 - 3.75 ft/sec with RW  5.  Patient will improve 5xSTS time to </= 16 seconds to demonstrate improved functional strength and transfer efficiency. Baseline: 12/13/23 - 23 seconds  Goal status: MET - 01/14/24 - 14.41 sec  6.  Patient will demonstrate at least 19/24 on DGI or 19/30 on FGA to improve gait stability and reduce risk for falls. Baseline: 01/09/24 - DGI = 15/24 Goal status: IN PROGRESS - 01/09/24  7.  Patient will improve Berg score by at least 8 points to improve safety and stability with ADLs in standing and reduce risk for falls.   Baseline: 12/13/23 - 41/56 Goal status: IN PROGRESS - 01/14/24 - 46/56  8.  Patient will report >/= 69% on ABC scale to demonstrate improved balance confidence and decreased risk for falls. Baseline: 880 / 1600 = 55.0 % Goal status: MET - 01/14/24 - 1160 / 1600 = 72.5 %  9. Patient will verbalize understanding of local Parkinson's disease community resources, including community fitness post d/c. Baseline:  Goal status: INITIAL   PLAN:  PT FREQUENCY: 2x/week  PT DURATION: 12 weeks  PLANNED INTERVENTIONS: 97164- PT Re-evaluation, 97750- Physical Performance Testing, 97110-Therapeutic exercises, 97530-  Therapeutic activity, [id number]- Neuromuscular re-education, 97535- Self Care, 02859- Manual therapy, [phone]- Gait training, [phone]- Electrical stimulation (unattended), 97035- Ultrasound, 02966- Ionotophoresis 4mg /ml Dexamethasone , 79439 (1-2 muscles), [phone] (3+ muscles)- Dry Needling, Patient/Family education, Balance training, Stair training, Taping, Joint mobilization, Spinal mobilization, Cryotherapy, and Moist heat  PLAN FOR NEXT SESSION: Review 4WW/rollator safety as indicated including proper gait pattern PRN; LE flexibility; core/postural strengthening; review seated, standing, supine and prone PWR! Moves PRN and re-introduce remaining position (quadruped) as tolerated; review education on tips to reduce freezing as indicated   Elijah CHRISTELLA Hidden, PT 01/20/2024, 7:41 PM

## 2024-01-21 ENCOUNTER — Encounter: Payer: Self-pay | Admitting: Neurology

## 2024-01-23 ENCOUNTER — Ambulatory Visit: Admitting: Physical Therapy

## 2024-01-23 NOTE — Progress Notes (Unsigned)
 Assessment/Plan:   1.  Parkinsons Disease             -stop all oral levodopa   -vyalev pump started today.  Pt shown how to use and demonstrated proper use:             -- Loading dose: 1.10 mL on day 1 of implementation (given today) and if off infusion for > 3 hours  - Base rate: 0.38 mL/h  - Low rate: 0.34 mL/h  - High rate: 0.42 mL/h  - Extra dose:: 0.15 mL   The following were discussed with the patient:  - Administer in the abdomen, avoiding a 5-cm radius area from the navel. - Rotate the infusion site and use a new infusion set daily - Administer using the patient own [employer]  pump and consumables provided by the company - The medication vials are for single use only. - Discard the syringe and any unused med in the syringe after it has been in the syringe for 24 hours.  2.  RBD/RLS/insomnia             -Continue clonazepam  0.5 mg, full tablet at bedtime.  Tried to stop this medication and things got much worse, especially mood change and daytime hypersomnolence did not get better.  Lower dosages resulted in increasing RBD symptoms.              -He is using Inbrija  for this as well.  Takes it right before bedtime and finds it useful.             -on mirtazapine , 15 mg q hs.               -some of night issues due to nocturia.  He is on several medications for nocturia 3.  Anxiety/depression             -Being followed by primary care.  On Depakote  ER, 250 mg daily.  May need to increase this in the future.  Nighttime agitation is becoming more problematic 4.  Daytime hypersomnolence             -Nocturnal polysomnogram was negative, but he was awake most of the night so not sure that it was accurate 5.  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             -Has declined Nuplazid thus far             - continue Donepezil , 10 mg daily.  This has helped hallucinations              -encouraged counseling for wife as caregiving burden increases.   6.  AM dizziness             -don't think related to Parkinsons Disease meds as it starts in the AM prior to taking meds and doesn't persist in the day             -he has d/c the amlodipine              -He is on low-dose atenolol  for PVCs.  They have decreased the dose of time.  They don't think that they can decrease further d/t pvc's.  7.  Renal insuff             -likely due to dehydration             -  may contribute to #6 8.  nocturia  - Now following with urology and on Myrbetriq  and flomax .  This is helping although not resolved. 9.  Pt has f/u in Jan and we will make a f/u in 30 days to check on him/pump  Subjective:   Vincent Walker was seen today in follow up for Parkinsons disease.  My previous records were reviewed prior to todays visit as well as outside records available to me. Pt with wife who supplements hx. Pt here to start vyalev.    Current prescribed movement disorder medications: carbidopa /levodopa  25/100 , 2 at 8am/2 at 11am/2 at 2pm/1 at 6pm Clonazepam  0.5 mg, 1 tablet at bedtime  carbidopa /levodopa  50/200 at bedtime Donepezil  (started last visit) Mirtazapine , 15 mg nightly Depakote , 250 mg daily Inbrija    PREVIOUS MEDICATIONS: Carbidopa /levodopa  25/100 IR; carbidopa /levodopa  25/100 CR (tried to change to see if would help EDS and didn't so changed back)  ALLERGIES:   Allergies  Allergen Reactions   Nsaids    Sulfa Antibiotics Other (See Comments)    Had a reaction as a child   Tolmetin Other (See Comments)    CURRENT MEDICATIONS:  Outpatient Encounter Medications as of 01/24/2024  Medication Sig   acetaminophen  (TYLENOL ) 500 MG tablet Take 1,000 mg by mouth every 6 (six) hours as needed (pain.).    atenolol  (TENORMIN ) 25 MG tablet Take 0.5 tablets (12.5 mg total) by mouth daily.   atorvastatin  (LIPITOR) 20 MG tablet Take 1 tablet (20 mg total) by mouth daily.   Brinzolamide -Brimonidine   (SIMBRINZA ) 1-0.2 % SUSP Place 1 drop into both eyes 3 (three) times daily.   carbidopa -levodopa  (SINEMET  CR) 50-200 MG tablet Take 1 tablet by mouth at bedtime.   carbidopa -levodopa  (SINEMET  IR) 25-100 MG tablet Take 2 tablets by mouth at 8am, 2 tablets at 11am, 2 tablets at 2pm, and 1 tablet at 6pm.   clonazePAM  (KLONOPIN ) 0.5 MG tablet Take 1 tablet (0.5 mg total) by mouth at bedtime.   divalproex  (DEPAKOTE  ER) 250 MG 24 hr tablet Take 1 tablet (250 mg total) by mouth daily.   donepezil  (ARICEPT ) 10 MG tablet Take 1 tablet (10 mg total) by mouth at bedtime.   ketoconazole  (NIZORAL ) 2 % shampoo Use every other day, applying directly to damp scalp, lather, let sit 5-10 minutes and rinse.   Levodopa  (INBRIJA ) 42 MG CAPS TWO capsules is ONE dosage (never inhale just one capsule). You can inhale the capsules as needed up to 5 times per day, separated by 2 hour intervals.   MAGNESIUM GLYCINATE PO Take 200 mg by mouth at bedtime.   mirabegron  ER (MYRBETRIQ ) 50 MG TB24 tablet Take 1 tablet (50 mg total) by mouth daily.   mirtazapine  (REMERON ) 15 MG tablet Take 1 tablet (15 mg total) by mouth at bedtime.   Multiple Vitamins-Minerals (PRESERVISION AREDS 2 PO) Take 1 tablet by mouth daily.   Polyethyl Glycol-Propyl Glycol (LUBRICANT EYE DROPS) 0.4-0.3 % SOLN Place 1 drop into both eyes 3 (three) times daily as needed (dry/irritated eyes.).   tamsulosin  (FLOMAX ) 0.4 MG CAPS capsule Take 1 capsule (0.4 mg total) by mouth daily.   No facility-administered encounter medications on file as of 01/24/2024.    Objective:   PHYSICAL EXAMINATION:    VITALS:   There were no vitals filed for this visit.   Wt Readings from Last 3 Encounters:  11/25/23 206 lb 9.6 oz (93.7 kg)  11/13/23 206 lb (93.4 kg)  07/08/23 207 lb 12.8 oz (94.3 kg)  GEN:  The patient appears stated age and is in NAD. HEENT:  Normocephalic, atraumatic.  The mucous membranes are moist. The superficial temporal arteries are without  ropiness or tenderness. CV:  RRR Lungs:  CTAB Neck/HEME:  There are no carotid bruits bilaterally.  Neurological examination:  Orientation: The patient is alert and oriented x3 but looks to wife for finer aspects Cranial nerves: There is good facial symmetry with facial hypomimia. The speech is fluent and clear. Soft palate rises symmetrically and there is no tongue deviation. Hearing is intact to conversational tone. Sensation: Sensation is intact to light touch throughout Motor: Strength is at least antigravity x4.  Movement examination: Tone: There is normal tone today in the upper and lower extremities. Abnormal movements: there is mild RUE rest tremor.   Coordination:  There is apraxia with questions of RAMs but he does have decremation bilaterally Gait and Station: The patient pushes off to arise.  He uses his walker.  He is standing fairly well with the walker.  Turns en bloc.  I have reviewed and interpreted the following labs independently    Chemistry      Component Value Date/Time   NA 141 11/28/2023 1419   K 4.5 11/28/2023 1419   CL 104 11/28/2023 1419   CO2 28 11/28/2023 1419   BUN 16 11/28/2023 1419   CREATININE 1.55 (H) 11/28/2023 1419   CREATININE 1.49 (H) 09/11/2023 1312      Component Value Date/Time   CALCIUM  9.0 11/28/2023 1419   ALKPHOS 98 11/28/2023 1419   AST 23 11/28/2023 1419   ALT 12 11/28/2023 1419   BILITOT 0.5 11/28/2023 1419       Lab Results  Component Value Date   WBC 6.7 11/28/2023   HGB 13.7 11/28/2023   HCT 42.0 11/28/2023   MCV 94.2 11/28/2023   PLT 121.0 (L) 11/28/2023    Lab Results  Component Value Date   TSH 2.15 11/28/2023     Total time spent on today's visit was 60 minutes, including both face-to-face time and nonface-to-face time.  Time included that spent on review of records (prior notes available to me/labs/imaging if pertinent), discussing treatment and goals, answering patient's questions and coordinating  care.  Cc:  Domenica Harlene LABOR, MD

## 2024-01-24 ENCOUNTER — Ambulatory Visit (INDEPENDENT_AMBULATORY_CARE_PROVIDER_SITE_OTHER): Admitting: Neurology

## 2024-01-24 DIAGNOSIS — G20B2 Parkinson's disease with dyskinesia, with fluctuations: Secondary | ICD-10-CM | POA: Diagnosis not present

## 2024-01-24 MED ORDER — VYALEV 12-240 MG/ML ~~LOC~~ SOLN: 0.3800 mL/h | Freq: Every day | SUBCUTANEOUS | Status: AC

## 2024-01-24 NOTE — Patient Instructions (Addendum)
 STOP all oral levodopa   Congratulations!  You have gotten started on the Vyalev pump.   VYALEV Complete also offers a 24/7 Hotline with trained nurse support to provide you with tips, troubleshooting advice, and answers to questions.  You may call [phone] with questions or concerns about your device.  If you have forgotten the steps you do to prepare yourself and prime and change your syringe, there is a video you can watch to assist if you would like.   You can go to:  [email or web address]  Please remember that the most important thing is hygiene!  Keep the area CLEAN.  Even if you don't shower every day, we want you to wash your abdomen with soap and water, followed by cleaning the area with the alcohol pad before you place the cannula.  Also remember that we want you to change your site every 24 hours.  Some of the nurses (who are great) may give you other directions based on FDA studies, but we want you to change the site daily to decrease your risk of infection.  If you don't have access to a computer to watch the above video, here is a written transcript of that video:  Before you administer VYALEV, you should be shown how to do it correctly by a healthcare provider.  This demonstration is intended to help you become familiar with the VYALEV delivery system and be used as a supplemental resource as you make infusing VYALEV part of your routine. Before you start VYALEV for the first time, and also each time you get a refill, please use the Medication Guide and the Instructions for Use materials that came with your drug vials, pump and other supplies from your Specialty Pharmacy. Use VYALEV exactly as your healthcare provider tells you to use it.  SUPER/VO: USE VYALEV is a prescription medicine used for treatment of advanced Parkinson's disease in adults. VYALEV contains two medicines, foscarbidopa and foslevodopa.  SUPER: Please see Important  Safety Information included in this video. Please see Instructions For Use, and full Prescribing Information, along with Medication Guide, which was provided with your pump.  SUPER: How to Use Your Delivery System  Vincent Walker: Hi! I'm Vincent Walker. I'm looking forward to helping you today.  (VO) Vincent Walker: I'd like to talk to you about using the [facility] Delivery System.  Vincent Walker: You were trained on how to use your system at the doctor's office. This video will help you remember what you learned.  You'll receive some Instructions for Use materials with the pump and supplies.  (VO) Vincent Walker: They explain the different supplies and how to use them, so read them over before beginning.  Vincent Walker: You'll also have your own dedicated VYALEV Complete Nurse Ambassador. They'll be your go-to person for support throughout your VYALEV treatment...  SUPER: Nurse Ambassadors are provided by [employer] and do not work under the direction of your health care professional (HCP) or give medical advice. They are trained to direct patients to their HCP for treatment-related advice, including further referrals.  Vincent Walker: ...and will visit you at home to help you practice.  SUPER: Your Nurse Ambassador will visit you at home  Nurse Ambassadors are provided by [employer] and do not work under the direction of your health care professional (HCP) or give medical advice. They are trained to direct patients to their HCP for treatment-related advice, including further referrals.  Vincent Walker: Learning your new VYALEV delivery system may take some time, but with practice and patience, you'll get there.  Vincent Walker:  First, we'll go over the 5 phases of setting up your infusion. You can remember them as the 5 Ps: Plan It Out, Prepare the Syringe, Prime the Tubing, Place the Cannula, and Pump the Medication.  SUPER: The 5 Ps Plan It Out Prepare the Syringe  Prime the Tubing Place the Cannula Pump the Medication  Vincent Walker: First is the Plan It Out phase.  Here you will check your supplies and get ready to begin.  SUPER: The 5 Ps  Plan It Out Prepare the Syringe Prime the Tubing  Place the Cannula Pump the Medication  Vincent Walker: Second is the Prepare the Syringe phase. In this phase, you transfer the medication from the vial to the syringe.  SUPER: The 5 Ps  Plan It Out Prepare the Syringe  Prime the Tubing  Place the Cannula Pump the Medication  Vincent Walker: The third phase is Prime the Tubing-- you'll fill the infusion tubing with the medication.  SUPER: The 5 Ps  Plan It Out  Prepare the Syringe Prime the Tubing  Place the Cannula Pump the Medication  (VO) Vincent Walker: The fourth is Place the Cannula. You'll put the cannula into the skin of your belly and connect the tubing.  SUPER: The 5 Ps  Plan It Out  Prepare the Syringe  Prime the Tubing  Place the Cannula  Pump the Medication  (VO) Vincent Walker: And fifth phase is Pump the Medication. You'll start the pump for your continuous infusion and use its carrying case.  SUPER: The 5 Ps  Plan It Out Prepare the Syringe Prime the Tubing  Place the Cannula Pump the Medication  Vincent Walker: Then, we'll go over instructions you'll need to follow when certain situations come up, like how to use the different settings on your pump, what you need to do when you shower, and more. At the end, we'll look at your [facility] resources and talk about how they can help.  SUPER: The 5 Ps  Plan It Out  Prepare the Syringe Prime the Tubing  Place the Cannula  Pump the Medication  Additions to Your Routine VYALEV Complete Resources  Vincent Walker: Let's get started.  (VO) Vincent Walker: The first phase is to Plan It Out.  SUPER: Phase 1: Plan It Out  (VO) Vincent Walker: First, wash your hands thoroughly with soap and water for at least 20 seconds. Scrub the front and back of both hands, between your fingers, and under your nails. Dry them with a new paper towel. We'll wash our hands and clean our workspace a few  times as we go along.  SUPER: Wash hands thoroughly for 20 seconds  Scrub:  Front and back of hands Between fingers Under your nails Dry with new paper towel  Vincent Walker: This is called aseptic technique. That's just a medical term for making sure everything we touch stays as clean as possible. Keeping you and your workspace clean can help reduce the risk of infection and irritation at your infusion site.  SUPER: Aseptic technique = practicing your clean routine  (VO) Vincent Walker: Next, grab your medication vial. If it was in the fridge, be sure to let it sit at room temperature for 30 minutes before you use it. Don't warm the medication up any other way. Just remember to keep it out of direct sunlight.  SUPER: [icon] If refrigerated, let medication sit at room temperature for 30 minutes. [icon] It can be kept at room temperature for up to 28 days.  (VO) Vincent Walker: Next, we'll make some space  to work. You can use any flat surface. Be sure to thoroughly clean the area and dry it with a new paper towel.  SUPER: Clean your workspace  (VO) Vincent Walker: Next, gather your supplies. Then we'll check that they're safe to use. When gathering your supplies...  SUPER: Gather and inspect supplies  Vincent Walker: ...be sure to keep them in their packaging until you are ready to use them.  SUPER: Keep supplies in packaging until ready to use  (VO) Vincent Walker: Here's your pump, medication vial, vial adapter, and syringe. Your infusion set includes your tubing, cannula insertion device, and cannula all in the same package. You'll also want to grab a new paper towel and alcohol pads. The only other thing you'll need is a fully charged pump battery.  SUPER: Pump, Medication Vial, Vial Adapter, Syringe, Infusion Set, New Paper Towel, Alcohol Pads, Battery Optional: Consider using transparent film dressing to ensure the infusion set stays in place.  Vincent Walker: Be sure that you have some extra supplies nearby in case you need  them.  (VO) Vincent Walker: Take a look at the packaging on each of the supplies, and check for damage and expiration dates.  Vincent Walker: If something's damaged or expired, don't use it. Contact your Specialty Pharmacy for replacements. Inspect your medication vial, too. Be sure the vial reads "VYALEV."  SUPER: A Specialty Pharmacy is a type of pharmacy that handles medication for conditions like advanced Parkinson's. These medications often need special storage and processing.  Vincent Walker: The color might be different from the one we have here. That's okay.  Vincent Walker: But the solution should not be cloudy. You shouldn't see any particles or flakes floating in it. If the solution doesn't look right, don't use that medication vial.  SUPER: The medication vial... [X]  Should not be cloudy [X]  Should not have particles or flakes [X]  Should not be expired If the solution doesn't look right, don't use that vial  Vincent Walker: You'll also need to put a fully charged battery in your pump. Your pump comes with 2 batteries. Always keep one charging while the other is being used in your pump. This way, you'll have a fully charged battery ready when you need it.  SUPER: Refer to your Battery Charger Patient Instructions for Use for more information. Always keep one battery charging  (VO) Vincent Walker: Match the metal strips between the battery and battery compartment, and insert the battery. You'll hear a "CLICK" when the battery is in place. Once installed, you'll hear the pump make some start-up sounds as it gets ready for use.  (VO) Vincent Walker: Now, wash your hands a second time. We want to make sure our hands are as clean as possible before we use our supplies.  SUPER: Wash hands again with soap and water for 20 seconds  (VO) Vincent Walker: Dry them with a new paper towel.  SUPER: Dry with a new paper towel  (VO) Vincent Walker: That was phase 1--Plan It Out. Here's a quick summary. Feel free to pause the video if you'd like to review the  steps, or go back if you'd like to re-learn this phase.  SUPER: Let's review phase 1: Plan It Out  Wash your hands thoroughly Grab your medication vial. If it was refrigerated, let it sit at room temperature for 30 minutes Clean your workspace Gather and inspect your supplies Wash your hands thoroughly a second time (VO) Vincent Walker: The second phase is Administrator, sports."  SUPER: Phase 2: Prepare the Syringe  Vincent Walker: In this phase, you'll  get your medication ready to use and place the filled syringe in the pump.  SUPER: Refer to your Instructions for Use of VYALEV.  (VO) Vincent Walker: Take off the medication vial's cap. Then, use one of your alcohol pads to clean the top of the vial. Give it 5 seconds to air-dry. This helps ensure everything stays as clean as possible.  SUPER: [Clock icon] Allow 5 seconds to dry  (VO) Vincent Walker: This is the vial adapter. It attaches to the top of your medication vial. It helps you transfer the medication from the vial into the syringe.  SUPER: Refer to your Vial Adapter Patient Instructions for Use.  (VO) Vincent Walker: Leave the vial adapter in the outer protective packaging. Peel back the cover, like this.  (VO) Vincent Walker: Hold the vial in one hand, on a flat workspace. Then, hold the adapter by the outer protective packaging in the other hand, and place it on top of the vial. Press down until you hear a SNAP sound. That's how you know you did it right!  Vincent Walker: Once you insert the vial adapter, the outer protective packaging can come off. The vial adapter will still be attached. You can throw the packaging away in your regular trash.  (VO) Vincent Walker: Take care not to touch the exposed end of the vial adapter.  SUPER: Don't touch exposed end of the vial adapter  (VO) Vincent Walker: The syringe is used to draw out the medication from the vial. Take the syringe out of its package.  (VO) Vincent Walker: Make sure you don't touch the syringe tip to any unclean surfaces to help avoid  contamination. If it does touch, throw it away and get a new one.  SUPER: If the syringe tip touches an unclean surface, you'll need to discard it and get a new one  (VO) Vincent Walker: Be certain the plunger is pushed in all the way. This makes sure all the air is out, so you can fill the syringe with your medication.  SUPER: Push plunger all the way in  (VO) Vincent Walker: Hold your syringe vertically, with the tip pointing down. Press the tip into your vial adapter to insert it. Turn it clockwise as you go.  SUPER: Don't overtighten  Vincent Walker: Once the syringe is inserted, flip it so the vial is on top, like this. Then, pull the plunger slowly all the way down. Withdraw the full contents of the vial into...  SUPER: Withdraw all the medication  (VO) Vincent Walker: ...the syringe to around the 12 mL mark. You will see air at the tip of the syringe.  SUPER: Withdraw all the medication 12mL  Vincent Walker: Next, check for large air bubbles. They may form when filling the syringe with the medication.  (VO) Vincent Walker: The tiny air bubbles may be hard to get out. That's okay. It's these larger ones you'll want to make sure to remove. They may affect your dosage and delay your medication's flow. Let's get rid of those bubbles.  SUPER: Check for large air bubbles  (VO) Vincent Walker: Slowly and gently rotate and tilt the syringe, like this. Do this a few times.  SUPER: Remove large air bubbles  (VO) Vincent Walker: You'll know you're doing it right when the air bubbles start to gather at the tip.  SUPER: Air bubbles will gather and disappear  (VO) Vincent Walker: Do not shake or tap the syringe to remove the air bubbles.  SUPER: Don't shake or tap the syringe  (VO) Vincent Walker: If the air bubbles won't budge, hold the  syringe vertically, with the tip facing down. Then rotate it so the tip is facing up. Gently turn it back and forth a few times.  Vincent Walker: You'll push out the air that gathered here. Slowly press the plunger in. This will push  the air out of your syringe and into the vial. Keep the syringe straight and try not to tilt it. This prevents another air bubble from forming. Continue pushing until all of the air is pushed out of the syringe and into the solution vial...  (VO) Vincent Walker: ...and there is solution visible in the syringe tip. You might feel some resistance. That's normal, keep going! Stop pressing the plunger when all the air is out.  (VO) Vincent Walker: Flip the syringe and vial so that the vial is right side up on the table. Now gently pull and turn the syringe counterclockwise to take it out. Be careful not to push the plunger or else the solution will leak. And that's it! The syringe is ready.  (VO) Vincent Walker: Make sure to lay the syringe down carefully, without touching the tip to any unclean surface. If it does touch, throw it away and get a new one.  SUPER: If the syringe tip touches an unclean surface, discard it and get a new one  Vincent Walker: Next, we'll connect the tubing, syringe, and pump together.  SUPER: Refer to your Infusion Set Patient Instructions for Use.  (VO) Vincent Walker: The infusion tubing is a long, thin tube that allows your medication to flow from the pump.  SUPER: Refer to your Infusion Set Patient Instructions for Use. Infusion tubing  (VO) Vincent Walker: It's packaged with your cannula insertion device. Take the tubing out of its package and remove the paper. You'll notice that your tubing has 2 different ends.  SUPER: Cannula insertion device  (VO) Vincent Walker: This end is called the site connector. It'll attach to the cannula on your belly.  SUPER: If the tubing ends touch an unclean surface, you'll need to discard it and get a new one. Site connector  (VO) Vincent Walker: This end is called the pump connector. It attaches to the syringe tip.  SUPER: If the tubing ends touch an unclean surface, you'll need to discard it and get a new one. Pump connector  (VO) Vincent Walker: Hold your syringe with the tip pointing up.  Take the pump connector end of the tubing. Attach it to the syringe by pressing and turning it clockwise.  (VO) Vincent Walker: Now we'll place the syringe inside your pump. Press any button to turn the pump display screen on. First press the MENU button. You'll see a few options. Highlight "Insert Syringe" and press "Select." Slide the latch to open the pump's lid.  SUPER: Refer to your VYALEV Pump Patient Instructions for Use.  (VO) Vincent Walker: Line up the syringe with the grooves inside the pump. The tip that's connected to your tubing will stick out. Close the lid. When you hear a SNAP sound--just like that! --it's fully closed and you're ready to keep going!  (VO) Vincent Walker: Click "Yes" to confirm the syringe is inside. The pump will prepare your medication for use!  (VO) Vincent Walker: If the syringe does not fit inside the pump, check to see if the syringe plunger rod pusher in the pump has moved to the correct position. Or check to see if the air or headspace has been removed from the syringe.  SUPER: Check the syringe plunger rod pusher Check that the air or headspace has been removed  (VO)  Vincent Walker: That's all for phase 2--Preparing the Syringe. Here's a quick summary. Again, feel free to pause the video if you'd like to review the steps, or go back if you'd like to re-learn this phase.  SUPER: Let's review phase 2: Prepare the Syringe  Use the vial adapter to transfer medication from the vial to the syringe Connect the tubing to the syringe Place the filled syringe in your pump. Follow the on-screen prompts to confirm the syringe is in (VO) Vincent Walker: Next, we'll prime the tubing. But before I show you how to prime...  SUPER: Phase 3: Prime the Tubing  (VO) Vincent Walker: ...I'll explain what it means and why it's important. Priming is when you fill the infusion tubing with your medication, before connecting it to your body.  SUPER: Refer to your VYALEV Pump Patient Instructions for Use. Priming = filling new  tubing with medication  (VO) Vincent Walker: A small amount of medication will flow through the tubing, out of the site connector and onto a new paper towel. You'll need to prime whenever you use new tubing. Let's take a look at how to do it.  (VO) Vincent Walker: Press any button to turn on your pump. The display screen will ask if you'd like to prime the tubing. Click "Yes." The display screen will then ask you to confirm that the tubing is not connected to the cannula. It should not be connected when priming. Click "Confirm."  (VO) Vincent Walker: We just connected one end of the tubing to the syringe. The other end of the tubing is called the "site connector." Take off the site connector's cover by squeezing the sides and sliding it off.  SUPER: Site connector (graphic circle/line to accompany this)  Vincent Walker: Keep it in a safe place where it'll stay clean. You'll need to cover the site connector if you disconnect from the pump for any reason, like to shower. Then, place the site connector on a new paper towel.  SUPER:  Keep it in a safe place where you will remember it It's used again when you disconnect from the pump (VO) Vincent Walker: Now, hold the pump with the syringe tip facing up, and press "Prime" once. If the pump is not straight up, the "Prime" option will not be available.  SUPER: Must hold pump straight up to prime  (VO) Vincent Walker: Each time you press "Prime", medication will push through the tubing from the syringe toward the site connector end.  (VO) Vincent Walker: Press the button once, and wait to see a drop of medication on the paper towel. If you don't see it after a few moments, press "No" on the pump to prime again.  (VO) Vincent Walker: The medication will eventually reach the end of the tubing and drip onto the paper towel. That means you're doing it right!  Vincent Walker: Your tubing is now primed. Press "Yes." Lay your pump flat on the table. Wait at least 1 minute to make sure the solution stops dripping from the site  connector.  SUPER: [Clock icon] Wait 1 minute  (VO) Vincent Walker: Then, without lifting the site connector from the paper towel, gently tap the tubing right where it meets the site connector to shake off any drops of medication.  Vincent Walker: If we don't shake the droplets off, they could make your site connector sticky and a bit difficult to remove from the cannula. We'll learn more about that later on.  Vincent Walker: That's it! The tubing is primed and ready. Now, it's important to remember: you do  not need to do this every time you change a syringe. Only prime when you are changing the tubing. Your tubing should be changed every time you change your cannula.  SUPER: Only prime when changing the tubing  (VO) Vincent Walker: And we're done with phase 3--Priming the Tubing. Here's a quick summary. Feel free to pause the video if you'd like to review the steps, or go back if you'd like to re-learn this phase.  SUPER: Let's review phase 3: Prime the Tubing  Lay the site connector on a new paper towel Use your pump to prime. You'll do this whenever you use new tubing (VO) Vincent Walker: Next, we'll insert the cannula.  SUPER: Phase 4: Place the Cannula  Vincent Walker: The cannula is inside this insertion device, which helps you put it into the skin of your belly. The tubing will attach to the cannula so the medication can be delivered under the skin.  (VO) Vincent Walker: For demonstration purposes, I'll show you how to insert the cannula using a foam belly. But before that, we need to choose a spot to insert the cannula. This spot is called the "infusion site." The preferred infusion site is on your belly. In some cases, your doctor may recommend inserting the cannula into another part of the body. Talk to your doctor about what's right for you.  SUPER: The belly is the preferred infusion site. Speak to your doctor about what infusion site is right for you. Preferred Infusion Site  Vincent Walker: Avoid skin that doesn't look normal. You'll also  want to avoid areas where clothing might get in the way, like your waist. Choose sites with the least risk of being impacted by physical activity, to prevent your cannula from being knocked out. If needed, remove hair around the insertion site. This will make sure the tape on the cannula stays secure.  SUPER: Choose an infusion site 2 inches away from skin that  Is tender, bruised, red or hard to the touch Naturally bends, creases or has folds Has significant sweat Might interfere with clothing and cause irritation Has body hair. Remove any body hair before inserting the cannula (VO) Vincent Walker: Insert the cannula at least 2 inches away from your belly button. The distance from your fingertip to your knuckle is about 2 inches. Use that as a guide when placing your cannula.  SUPER: 2"  Vincent Walker: Rotate the infusion site by choosing an area that is at least 1 inch from previous sites used in the last 12 days.  SUPER: At least 1 inch away from previous sites used in the last 12 days Rotate your infusion site at least once every 3 days, or more frequently as directed by your doctor.  (VO) Vincent Walker: The distance from the tip of your thumb to your knuckle is about 1 inch.  SUPER: 1"  (VO) Vincent Walker: Wash the infusion site with soap and water.  SUPER: Wash infusion site with soap and water  (VO) Vincent Walker: Then, use an alcohol pad to wipe the insertion area in an outward spiral, not back and forth, to avoid moving bacteria onto the site. Do not blow on it.  SUPER: Wipe your skin  Vincent Walker: Instead, let it air-dry for at least 1 minute so that the tape on the cannula can stick to the skin properly.  SUPER: [icon] Wait 1 minute to air-dry  (VO) Vincent Walker: The cannula insertion device will help you put the cannula in the skin of your belly. When you press this button at the  top, it'll insert the cannula under your skin.  SUPER: Refer to your Infusion Set Patient Instructions for Use.  Vincent Walker: Right now, the  button has this safeguard on it. This makes sure we don't accidentally press the button before we're ready to insert the cannula.  (VO) Vincent Walker: The arrows running down the side indicate where the tubing will be connected after insertion.  (VO) Vincent Walker: On the bottom there's a piece of paper covering up tape. It's like the paper that covers the tape on a bandage. Let's see how it works!  (VO) Vincent Walker: First, remove the paper from the bottom to expose the adhesive tape. Gently pull it in a circular motion so the tape doesn't stick to itself.  (VO) Vincent Walker: Next, take off the safeguard at the top. Gently squeeze the sides and pull it straight out.  Vincent Walker: Keep the safeguard in a clean, safe place, with the site connector cover we removed before. You'll need both when you disconnect from your pump.  SUPER: Store in a clean, safe place  (VO) Vincent Walker: Note the arrows on the insertion device. This shows the side of the cannula where the tubing will be connected.  (VO) Vincent Walker: With the insertion device in one hand, use the other to gently stretch your skin. This will create a taut, flat surface. Then, hold the insertion device against your skin. Now press this red button down completely to insert the cannula. It will make a "CLICK" sound as it's inserted. Just like that!  (VO) Vincent Walker: Keep the device pressed against your skin for 5 seconds to make sure the tape sticks. Then, carefully pull the insertion device away from your body.  SUPER: [Clock icon] Press for 5 seconds  Vincent Walker: The cannula and the tape will stay on your skin. Press the tape down to make sure it's secure. Then, dispose of the insertion device properly in a sharps disposal container.  SUPER: A sharps container is a recycling bin for used needles and other medical supplies.  Vincent Walker: Even if you unsuccessfully insert the cannula, don't reinsert in the same site again or reuse the same cannula. Get a new, unused insertion device and place the  new cannula in a different location. Be sure to use a new insertion device at a new infusion site if the adhesive tape becomes loose or if it needs to be readjusted or replaced.  (VO) Vincent Walker: Now we'll connect the tubing to the cannula. We'll do this by inserting the site connector end into the cannula that we just placed on your belly. Place a finger on the cannula housing and push the site connector in. You'll know you inserted it fully when you hear a CLICK sound. This is important to help prevent any medication from leaking out.  Vincent Walker: Now your VY[facility] delivery system is fully connected! Be sure to frequently check your infusion site to ensure the cannula remains in place, that fluid isn't leaking onto the skin and that the site is not irritated. If you experience any redness, pain or swelling, be sure to replace the cannula in a new infusion site and call your doctor.  SUPER: Check infusion site often  Cannula stays in place No leaking No irritation Vincent Walker: You can change your cannula as often as you need to. But it should be changed at least every 3 days or more frequently as directed by your doctor.  SUPER: [icon] Replace your tubing and cannula at least once every 3 days, or more frequently as  directed by your doctor  (VO) Vincent Walker: That's it for phase 4: Placing the Cannula. Here's a quick summary. Feel free to pause the video if you'd like to review the steps, or go back if you'd like to re-learn this phase.  SUPER: Let's review phase 4: Place the Cannula  Choose an infusion site on your belly Prepare the site Place the cannula with the cannula insertion device Attach the tubing to the cannula on your body (VO) Vincent Walker: We made it to the last phase--starting and using your pump.  SUPER: Phase 5: Pump the Medication  (VO) Vincent Walker: Press any button to turn your pump on. Press "Continue". Then press YES. The screen will time out after 20 seconds of inactivity. But the pump won't turn  off unless the battery is removed. Your pump will now start doing its job--giving you your medication.  SUPER: Refer to your [facility] Pump Patient Instructions for Use.  (VO) Vincent Walker: The pump screen will say "Running" in green at the top corner when the medication is flowing. That's how you know you did it right! Your continuous infusion is in progress. Your medication will flow until your syringe runs out.  SUPER: Pump status  (VO) Vincent Walker: The screen will also show a countdown of the time left until you need to replace the syringe with new medication. The time shown is dependent on the dose your doctor prescribed.  Vincent Walker: Keep in mind, once the medication is taken out of the vial, it must be used within 24 hours, or thrown away.  SUPER: Medication must be used and syringe must be replaced within 24 hours.  Vincent Walker: Check on the cannula often. It should not be loose or leaking. The skin around the infusion site shouldn't be irritated. If you're having a skin reaction or irritation, remove the cannula and call your doctor. If your doctor advises to change your cannula, be sure to use a new site at least 2 inches away from the area of irritation.  (VO) Vincent Walker: You also received this carrying case with your pump. Your pump goes in the case, and the strap allows you to wear it throughout your day.  SUPER: Refer to your [facility] Carrying Accessory Patient Instructions for Use.  (VO) Vincent Walker: Unzip the case and place your pump inside, with the buttons facing out so you can see them through the plastic window.  (VO) Vincent Walker: Make sure the syringe tip is lined up with the opening on the side of the case. Now you can zip it up! When wearing your pump, make sure that you don't leave the infusion tubing hanging loose. It could get caught on something and pull out your cannula. Also check that the tubing isn't kinked or folded so the medication's flow isn't blocked.  (VO) Vincent Walker: That's it for phase 5: Pump the  Medication. Here's a quick summary. Feel free to pause the video if you'd like to review the steps, or go back if you'd like to re-learn this phase.  SUPER: Let's review phase 5: Pump the Medication  Follow the screen's prompts to start your pump Check the screen to confirm when the syringe is to be replaced Place your pump in its carrying case Vincent Walker: There are a few more things to go over. But before we move on, let's summarize the 5 Ps. First is the Plan It Out phase: the things you need to do before you use your supplies. Second, Prepare the Syringe. Transfer the medication from the vial into  the syringe. Third, Prime the Tubing. Fill the infusion tubing with your medication, before connecting it to your cannula. Fourth, Place the Cannula. Choose and clean a spot on your skin to insert the cannula and connect the tubing. And lastly, Pump the Medication. Start the pump, and the medication will flow. That's it for the 5 Ps!  SUPER: Continuous Infusion phases: The 5 Ps  Plan It Out  Prepare the Syringe Prime the Tubing  Place the Cannula Pump the Medication  (VO) Vincent Walker: You know how to set up your continuous infusion! Now let's go over a few more things.  SUPER: Additions to Your Routine  Vincent Walker: Like what to do when showering, how to get an extra dose, how to replace supplies, and more.  Vincent Walker: First we'll learn how to stop and restart your pump. Then, we'll talk about how to replace your syringe. We'll also look at how to disconnect and reconnect your pump. There are different sets of instructions for reconnecting, depending on if you've been disconnected for under 1 hour, or over 1 hour. Then, we'll look at how to replace the syringe, infusion tubing and cannula, which you'll do regularly. Next, we'll talk about changing the medication's flow rate, delivering an extra dose, and delivering a loading dose. Last, we'll learn about the different alarms and messages your pump may give  you.  SUPER: Additions to Your Routine:  Stop and restart your pump Replace syringe only Disconnect and reconnect, if under 1 hour Replace infusion tubing and cannula, if over 1 hour Replace syringe, infusion tubing and cannula Flow Rate, Extra Dose and Loading Dose Pump alarms and messages (VO) Vincent Walker: You'll stop and restart your pump whenever you replace your supplies...  SUPER: How to stop and restart your pump  Vincent Walker: ...or disconnect from the delivery system. But remember, if you stop your infusion for more than 1 hour, be sure to replace the cannula and infusion tubing, or they could get blocked up. We'll go over how to do that later on.  (VO) Vincent Walker: To stop your pump, first turn on the pump display screen by pressing any button. Then press MENU. Highlight STOP PUMP and press SELECT. The screen will ask you to confirm that you want to stop the pump. Press YES.  (VO) Vincent Walker: It'll stop your medication's flow. If done correctly, the pump will say "STOPPED" in red on the top right corner.  SUPER: Pump status  (VO) Vincent Walker: When you're ready to restart the pump again, press any button to turn the pump screen on. Press MENU, then highlight START PUMP. Press SELECT, then YES to confirm you want to start the pump again. That's it!  (VO) Vincent Walker: Now we'll go over how to replace the syringe, but keep the same cannula and tubing you're already using.  SUPER: How to replace the syringe only  Vincent Walker: You'll need to change the syringe in your pump at least once every 24 hours. You may have to change it more often because the syringe is empty or nearly empty, depending on the dose prescribed by your healthcare provider. Before we get started, I'll share a few important notes. Since you're using the same tubing as before, you won't need to prime it.  SUPER: Syringe must be changed at least every 24 hours It's okay if there's some medication left in your syringe when it's time to replace  it  (VO) Vincent Walker: You'll want to start replacing your syringe about 30 minutes before the medication in  your current syringe is due to run out.  SUPER: Start preparing a new syringe at 30 minutes  Vincent Walker: Check the countdown on your pump's screen. This way, you won't have to go without your medication while you get the next syringe ready.  Vincent Walker: First, follow the Plan It Out phase instructions we went over at the start of this video.  SUPER: Follow the Plan It Out phase  (VO) Vincent Walker: That includes washing and drying your hands, getting your workspace ready, gathering and inspecting your supplies, and washing your hands a second time.  SUPER:  Plan It Out:  Wash your hands for at least 20 seconds and dry with a new paper towel Get your workspace ready Gather and inspect your supplies Wash and dry your hands a second time Vincent Walker: The only difference this time, is that we won't be needing a few things. So when you gather your supplies, you'll only need your pump, syringe, medication vial, vial adapter, new paper towels, and alcohol pads.  SUPER: You'll need:  Pump Syringe Medication vial Vial adapter New paper towels Alcohol pads (VO) Vincent Walker: Follow the steps we went over during the Prepare the Syringe phase earlier.  SUPER: Follow the Prepare the Syringe phase  Vincent Walker: This includes cleaning the vial top with an alcohol pad. In this step, you'll transfer your medication into your syringe.  Vincent Walker: Turn on your pump display screen by pressing any button. Press MENU to see a few options. Use the arrow keys to highlight CHANGE SUPPLIES. Press SELECT, then press YES. It will stop so you can replace the syringe. Follow the prompts to see the REMOVE SYRINGE menu. Press SELECT. Your pump will get the syringe ready to be removed. When it's done, the display screen will let you know and then prompt you to open the lid.  (VO) Vincent Walker: Slide the latch, and take out the old syringe. If you do  not tell your pump that you're replacing the syringe, your new syringe will not fit.  Vincent Walker: Take the tubing off the old syringe. Attach it to the new syringe tip by pressing and turning it clockwise.  (VO) Vincent Walker: Now we'll place the new syringe inside your pump. Match the grooves and leave the tip out. Make sure it's in place, and close the lid. You'll hear a SNAP when it's completely shut. That's how you know you did it right.  (VO) Vincent Walker: If the syringe doesn't fit inside the pump groove, check to see if the syringe plunger rod pusher is advanced to the correct position and air has been removed. Be sure to discard the used syringe according to your local guidelines.  (VO) Vincent Walker: The pump display screen will ask if the new syringe is in. Click YES, and it'll prepare the new syringe for use. The screen will let you know when it's ready, and will ask if you want to prime again. Click NO, since we didn't replace the tubing. Make sure everything else is still connected. Then click YES to start the pump. That's it!  (VO) Vincent Walker: Next we'll look at how to disconnect from your pump and then reconnect.  SUPER: Disconnecting and reconnecting if it's been under 1 hour  Vincent Walker: Only use these instructions when you want to disconnect for less than 1 hour. This includes when you want to shower or swim. You can't get in the water with your pump or tubing. They cannot get wet.  SUPER: Reconnecting after less than 1 hour  (VO)  Vincent Walker: Let's take a look at how to disconnect. First, turn on the pump screen by pressing any button. Then press the MENU button. Highlight STOP PUMP. Click YES.  (VO) Vincent Walker: Hold the cannula housing so it doesn't move. Take the site connector end of your tubing, and gently squeeze the sides. Pull it straight out.  (VO) Vincent Walker: Once the site connector is removed, lay it on a new paper towel. Gently tap the tubing at the site connector end. This will shake off any drops of medication,  so it doesn't get sticky.  (VO) Vincent Walker: Wait 60 seconds. Then, to protect the site connector, replace the white cover that came with it.  SUPER: [Clock Icon] Wait 60 seconds  (VO) Vincent Walker: And to protect yourself, attach the clean, transparent cannula cover, called the safeguard, that came with the insertion device. Insert the safeguard into the cannula. You'll hear a CLICK! noise when it's fully inserted. Keep the tubing and pump in a clean, dry place.  Vincent Walker: Keep track of the time after you disconnect. There are different ways to reconnect depending on how long it's been since you disconnected.  SUPER: Make note of time you disconnect. There are different ways to reconnect depending on whether you've been disconnected under or over one hour.  Vincent Walker: Here's how to reconnect your pump and tubing if it's been less than an hour since disconnecting.  SUPER: [Clock Icon] Less than 1 hour  (VO) Vincent Walker: Remove the safeguard that's plugging your cannula. Grab your tubing and take off the site connector's cover. Now place a finger on the top of the cannula housing and push the site connector straight into the cannula, like this. You'll hear another "CLICK" when it's in.  (VO) Vincent Walker: Grab your pump. Turn on the screen by pressing any button. Then press the MENU button. Select START PUMP from the options list. Press YES. That's it! The pump will continue your infusion and count down the time left on your syringe.  (VO) Vincent Walker: Before we move on, let's see what to do if you're trying to disconnect but your site connector is hard to remove from your cannula. Sometimes this can happen if the medication makes it sticky.  SUPER: What to do if your site connector is stuck  (VO) Vincent Walker: First, run a clean cloth under really warm water until it's dripping wet.  SUPER: If your site connector is stuck:  Run a clean cloth under warm water (VO) Vincent Walker: Let the wet cloth soak on the site connector for at  least 2 minutes to help dissolve the sticky medication. Gently rub the cloth, too.  SUPER: If your site connector is stuck:  Run a clean cloth under warm water Let it soak on the site connector for 2 minutes. Gently rub the cloth (VO) Vincent Walker: Then, squeeze the sides of the site connector and try to pull it out.  SUPER: If your site connector is stuck:  Run a clean cloth under warm water Let it soak on the site connector for 2 minutes. Gently rub the cloth Squeeze the sides of the site connector and try to pull it out (VO) Vincent Walker: If it's still stuck, try soaking and applying the cloth again. If that doesn't work, remove the cannula with the tubing attached. Replace it with a new cannula and tubing.  SUPER: If it's still stuck, try soaking and applying the cloth again. If that doesn't work:  Remove the cannula with the tubing attached Replace it with a  new cannula and new tubing If you need further help, call your doctor.  (VO) Vincent Walker: You will have to replace your cannula and tubing if you've been disconnected from the pump for over an hour...  SUPER: Replacing tubing and cannula (not syringe) if you've been disconnected for over 1 hour  Vincent Walker: ...or whenever you replace your cannula. Now let's take a look at how to disconnect your cannula and infusion tubing. Then we'll reconnect new ones. We won't be replacing the syringe.  Vincent Walker: First, follow the Plan It Out phase that we went over at the start of this video.  SUPER: Follow the Plan It Out phase  (VO) Vincent Walker: That includes washing and drying your hands, getting your workspace ready, gathering and inspecting your supplies, and washing your hands a second time.  SUPER: Plan It Out:  Wash your hands for at least 20 seconds and dry with a new paper towel Get your workspace ready Gather and inspect your supplies Wash and dry your hands a second time Vincent Walker: Gather your supplies--including the infusion set, alcohol pads, and new  paper towels.  SUPER: Gather new supplies:  Infusion set Alcohol pads New paper towels (VO) Vincent Walker: First, turn on your pump display screen by pressing any button. Press MENU. If your pump is running, press SELECT to choose the STOP PUMP option. Then press YES.  (VO) Vincent Walker: Then take the pump connector end of the tubing off the syringe tip. Turn it counterclockwise, and pull it off.  (VO) Vincent Walker: Now, take out the cannula with the tubing still attached. Carefully loosen the tape from your skin, like you would when pulling off a bandage. Then pull the cannula housing away from your body to take it out.  Vincent Walker: Once it's removed, check the cannula and infusion site to ensure no parts of the cannula were left behind.  SUPER: Dispose of these supplies properly based on your local guidelines.  Vincent Walker: If you think the plastic part of the cannula was detached from the adhesive and is still under your skin, call your healthcare professional.  SUPER: Contact your healthcare professional if you notice any skin changes at the infusion site, such as the spreading of redness, swelling, warmth, pain or discoloration when pressure is applied to the area.  (VO) Vincent Walker: Next, attach your new tubing to the syringe tip. Twist clockwise until it's snug. Don't twist it too tight, though! Since you're using new tubing, you'll need to prime it, like we did earlier.  (VO) Vincent Walker: Select MENU on your pump. Use the arrow keys to highlight CHANGE SUPPLIES and press SELECT. Highlight PRIME INFUSION LINE. Press SELECT, and CONFIRM.  (VO) Vincent Walker: To finish priming the tubing, follow the steps we went over earlier in the Prime the Tubing phase. Don't forget to tap the site connector and wait 60 seconds to avoid a sticky connector. Next, you'll insert a new cannula into the skin of your belly and connect your tubing.  SUPER: [check box] Follow the Prime the Tubing phase  (VO) Vincent Walker: To do this, follow the steps we went  over earlier in the Place the Cannula phase. Be sure to dispose of your insertion device properly in a sharps disposal container. Remember to choose an infusion site that is at least 1 inch from previous sites used in the last 12 days.  SUPER: [check box] Follow the Prime the Tubing phase [check box] Follow the Place the Cannula phase  (VO) Vincent Walker: After that, you're ready to start  the pump again. The display screen will ask you to do this after you connect the tubing to the cannula. Press CONTINUE and then YES.  (VO) Vincent Walker: Now let's take a look at how to replace your syringe, infusion tubing, and cannula.  SUPER: Replacing the syringe, infusion tubing and cannula  Vincent Walker: Your pump will let you know when to change your syringe. You should start this process about 30 minutes before your medication is due to run out.  SUPER: Start preparing a new syringe at 30 minutes  Vincent Walker: Check the countdown on your pump's screen. This way, you won't have to go without your medication while you get the next syringe ready.  Vincent Walker: First, follow the Plan It Out phase we went over at the start of this video.  SUPER: Follow the Plan It Out phase  Vincent Walker: That includes washing and drying your hands, taking your medication vial out and letting it sit at room temperature for 30 minutes if it was refrigerated, getting your workspace ready, gathering and inspecting your supplies, and washing your hands a second time.  SUPER: Plan It Out:  Wash and dry your hands If refrigerated, let your medication sit for 30 minutes before use Get your workspace ready Gather and inspect your supplies Wash and dry your hands again Vincent Walker: Then, follow the steps we went over during the Prepare the Syringe phase. In this step, you'll get your medication ready for use in your syringe.  SUPER: Follow the Prepare the Syringe phase  (VO) Vincent Walker: Next, stop the pump. Turn on the screen by pressing any button. Press MENU, then  SELECT to see the STOP PUMP menu. Click YES. You should see a red "STOPPED" in the upper right corner.  (VO) Vincent Walker: Now we'll take out the cannula with the tubing still attached. Carefully loosen the tape from your skin, like you would when pulling off a bandage. Then pull the cannula housing away from your body to pull it out.  (VO) Vincent Walker: Once it's removed, inspect the cannula and infusion site to ensure no parts of the cannula were left behind.  SUPER: If you think the plastic part of the cannula is still under your skin, call your doctor.  (VO) Vincent Walker: You'll need to take the syringe out of the pump. Press MENU and highlight CHANGE SUPPLIES. Follow the prompts to see the REMOVE SYRINGE menu. Press SELECT.  (VO) Vincent Walker: Your pump will now get the syringe ready to be removed. When it's done, the screen will then ask you to open the lid. Slide the latch and take out the old syringe. Dispose of your used syringe and infusion set properly based on your local guidelines.  (VO) Vincent Walker: The next few steps are all things we've done before. You'll have to connect new tubing to the tip of your new syringe, and place the new syringe in your pump.  SUPER: [check box] Follow the Prepare the Syringe phase [check box] Follow the Prime the Tubing phase [check box] Follow the Place the Cannula phase  (VO) Vincent Walker: You'll need to prime the tubing, since we're using a new one.  (VO) Vincent Walker: Remember to wipe the alcohol pad in an outward spiral, not back and forth, over your infusion site.  (VO) Vincent Walker: Insert the new cannula, and make sure to stay at least 1 inch away from any previous sites used in the last 12 days.  (VO) Vincent Walker: Then insert the site connector.  (VO) Vincent Walker: The last step is to  start your pump. After you connect the tubing, the screen will ask if you want to start. Press CONTINUE and then YES. That's it! Now you know how to use your delivery system when different situations come up.  (VO)  Vincent Walker: Now we'll talk about the infusion dose options you may have in your pump's menu.  SUPER: Flow Rate, Extra Dose and Loading Dose  Vincent Walker: Dose options are different ways to change the amount of medication being infused. But keep in mind, you may not be able to use these options on your pump. It depends on how your doctor programmed it. If you're not sure, give your doctor a call.  SUPER: Call your doctor if you have questions about infusion dose options. Refer to your [facility] Patient Instructions for Use.  (VO) Vincent Walker: The first option is to change your medication's flow rate. The flow rate determines the amount of time one syringe is used and how much medication you're receiving. Here's how you do it. Go to MENU. Then select "Change Rate."  SUPER: Flow Rate = How fast your dose is administered  (VO) Vincent Walker: The second option is to deliver an extra dose of medication. That means you'll get a small amount of medication infused over a short amount of time.  (VO) Vincent Walker: The third option is to deliver a loading dose of medication. A loading dose is a larger dose of your medication that flows more quickly than a typical dose. This is used when you first use your pump or when your pump has been off for at least 3 hours, if enabled by your doctor. They'll set a lockout time for how often a loading dose can be administered.  Vincent Walker: If these options are available for you, ask your doctor how and when you should use them. You can also refer to the Instructions for Use that came with your pump for more information.  SUPER: Call your doctor if you have questions about infusion dose options.  (VO) Vincent Walker: Let's talk about the different messages and alarms your pump will give you.  SUPER: Pump alarms and messages  Vincent Walker: These are intended to alert you that you may need to do something. Here are some examples of alarms and messages you may see on your pump.  (VO) Vincent Walker: Your pump will alert  you when you have 2 hours left until your syringe runs out, and again when you have 45 minutes left on your syringe.  Vincent Walker: At this point, you'll want to remove your next medication vial from the fridge.  SUPER: If refrigerated, remove 30 minutes before use  (VO) Vincent Walker: It'll alert you if your syringe is empty and needs to be replaced. It'll also let you know when your syringe has been in the pump for 24 hours.  Vincent Walker: Remember, the syringe and any unused medication should be thrown away after 24 hours.  SUPER: Your syringe must be disposed of every 24 hours. It's okay if there's some medication left in your syringe when it's time to replace it.  Vincent Walker: There are many other types of alarms and messages that your pump may show you. Each is considered either "high" or "low" priority.  Vincent Walker: Alarms that are "high priority" mean that your pump has stopped, and you'll need to take action immediately. You'll see a red caution symbol on your pump display screen for a high priority alarm. You'll also hear a sound. Press "OK" on your pump to confirm you heard the alarm,  and the sound will stop. Then, refer to your Pump Patient Instructions for Use to see how to fix the problem.  SUPER: High priority = take action now  Vincent Walker: Alarms that are "low priority" mean that you need to do something soon to prevent a "high priority" alarm later on. You'll see a yellow caution symbol on your pump display screen for a low priority alarm. Each alarm is given for a particular reason and has its own unique sound.  SUPER: High priority = take action now  Low priority = there may be a problem soon  Vincent Walker: For a full list that describes what each alarm means and what you should do when they pop up, look at your pump's Instructions for Use. Don't interact with your delivery system while you're operating motorized vehicles or machinery, or otherwise engaging in activities where distractions need to be  avoided.  SUPER: Find a full list of pump alarms and messages in your [facility] Pump Patient Instructions for Use.  Vincent Walker finished! That was a lot to learn. You can practice with your Nurse Ambassador until you feel prepared to do this at home.  SUPER: Practice with your Nurse Ambassador until you feel prepared  Vincent Walker: When challenges pop up, your [facility] dedicated support will be there to help you get back on track. Your Nurse Ambassador is happy to answer any questions as you go along.  Vincent Walker: You can call the [facility] 24/7 Hotline any time, even when your Nurse Ambassador is not available. The Hotline is there whenever you need it.  SUPER: Call the [facility] 24/7 Hotline at [phone] The 24/7 Hotline is staffed by nurses that are specifically trained on [facility] and can be reached 24 hours per day, 7 days a week. After business hours, you may be asked to leave a message for a return call within 30 minutes.  Vincent Walker: Remember, you can rely on [facility]. We're always here to help.  SUPER: [facility] 24/7 support

## 2024-01-27 ENCOUNTER — Ambulatory Visit: Admitting: Physical Therapy

## 2024-01-27 ENCOUNTER — Encounter: Payer: Self-pay | Admitting: Physical Therapy

## 2024-01-27 DIAGNOSIS — G20A2 Parkinson's disease without dyskinesia, with fluctuations: Secondary | ICD-10-CM | POA: Diagnosis not present

## 2024-01-27 DIAGNOSIS — M6281 Muscle weakness (generalized): Secondary | ICD-10-CM | POA: Diagnosis not present

## 2024-01-27 DIAGNOSIS — R296 Repeated falls: Secondary | ICD-10-CM

## 2024-01-27 DIAGNOSIS — R2681 Unsteadiness on feet: Secondary | ICD-10-CM | POA: Diagnosis not present

## 2024-01-27 DIAGNOSIS — R2689 Other abnormalities of gait and mobility: Secondary | ICD-10-CM

## 2024-01-27 NOTE — Therapy (Signed)
 OUTPATIENT PHYSICAL THERAPY PARKINSON'S TREATMENT    Patient Name: Vincent Walker MRN: [id number] DOB:02/01/1946, 78 y.o., male Today's Date: 01/27/2024   END OF SESSION:  PT End of Session - 01/27/24 1314     Visit Number 12    Date for Recertification  02/20/24    Authorization Type Medicare & Federal BCBS    Progress Note Due on Visit 20    PT Start Time 1314    PT Stop Time 1358    PT Time Calculation (min) 44 min    Activity Tolerance Patient tolerated treatment well    Behavior During Therapy [facility] for tasks assessed/performed;Flat affect                Past Medical History:  Diagnosis Date   Adenomatous polyp of ascending colon 10/09/2018   Allergies 07/07/2018   Arthritis    Astigmatism of both eyes with presbyopia 08/21/2022   Blepharitis of upper and lower eyelids of both eyes 01/10/2016   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 Required nasal packing a couple of days ago.  Had a similar episode of epistaxis a little over a year ago.  At that visit, I could not see the exact bleeding spot. EXAMINATION after packing removal reveals several excoriated areas anteriorly but I was unable to see t   Essential hypertension 12/25/2011   Eustachian tube dysfunction, left 02/21/2021   Flank pain 07/19/2022   History of blood transfusion 2010   After Kidney surgery   History of chicken pox    History of renal cell carcinoma 02/08/2015   diagnosed and removed in 2010 Right Monitored by Dr Tanda Moats of urology at [facility] Dr Claudine Alley, nephrology at [facility]   Hyperglycemia 12/23/2015   Increased thyroid  stimulating hormone (TSH) level 06/07/2017   Ingrown left big  toenail 02/14/2015   Left foot pain 02/14/2015   MVP (mitral valve prolapse) 05/14/2016   Parkinson's disease (HCC) 12/23/2015   Posterior vitreous detachment of both eyes 08/21/2022   Primary open-angle glaucoma, left eye, severe stage 05/05/2021   Primary open-angle glaucoma, right eye, moderate stage 01/10/2016   Pseudophakia, both eyes 08/21/2022   Right hip pain 12/12/2016   Stage 3 chronic kidney disease 12/25/2011   Thrombocytopenia 02/08/2015   Tremor 02/14/2015   Past Surgical History:  Procedure Laterality Date   APPENDECTOMY  1995   CATARACT EXTRACTION, BILATERAL     COLONOSCOPY     COLONOSCOPY WITH PROPOFOL  N/A 12/17/2018   Procedure: COLONOSCOPY WITH PROPOFOL ;  Surgeon: Wilhelmenia Aloha Raddle., MD;  Location: [facility] ENDOSCOPY;  Service: Gastroenterology;  Laterality: N/A;   ENDOSCOPIC MUCOSAL RESECTION N/A 12/17/2018   Procedure: ENDOSCOPIC MUCOSAL RESECTION;  Surgeon: Wilhelmenia Aloha Raddle., MD;  Location: [facility] ENDOSCOPY;  Service: Gastroenterology;  Laterality: N/A;   HEMOSTASIS CLIP PLACEMENT  12/17/2018   Procedure: HEMOSTASIS CLIP PLACEMENT;  Surgeon: Wilhelmenia Aloha Raddle., MD;  Location: [facility] ENDOSCOPY;  Service: Gastroenterology;;   left knee scope  2003   POLYPECTOMY  12/17/2018   Procedure: POLYPECTOMY;  Surgeon: Wilhelmenia Aloha Raddle., MD;  Location: [facility] ENDOSCOPY;  Service: Gastroenterology;;   right knee scope  1999   SUBMUCOSAL LIFTING INJECTION  12/17/2018   Procedure: SUBMUCOSAL LIFTING INJECTION;  Surgeon:  Mansouraty, Aloha Raddle., MD;  Location: [facility] ENDOSCOPY;  Service: Gastroenterology;;   TOE SURGERY Left    metal 2nd toe- and top of foot- straighten bone   TONSILLECTOMY     TOTAL KNEE ARTHROPLASTY Left 07/06/2014   TOTAL NEPHRECTOMY Right    Patient Active Problem List   Diagnosis Date Noted   Cellulitis of left foot 09/17/2023   Dementia due to Parkinson's disease 11/20/2022   Dry eye syndrome of both eyes 08/28/2022   Astigmatism of both eyes with  presbyopia 08/21/2022   Choroidal nevus of right eye 08/21/2022   Posterior vitreous detachment of both eyes 08/21/2022   Pseudophakia, both eyes 08/21/2022   Primary open-angle glaucoma, left eye, severe stage 05/05/2021   Eustachian tube dysfunction, left 02/21/2021   Early dry stage nonexudative age-related macular degeneration of both eyes 03/09/2019   Adenomatous polyp of ascending colon 10/09/2018   Epistaxis 07/07/2018   Allergies 07/07/2018   Obesity 06/15/2018   Increased thyroid  stimulating hormone (TSH) level 06/07/2017   MVP (mitral valve prolapse) 05/14/2016   Blepharitis of upper and lower eyelids of both eyes 01/10/2016   Primary open-angle glaucoma, right eye, moderate stage 01/10/2016   Parkinson's disease (HCC) 12/23/2015   Hyperglycemia 12/23/2015   Tremor 02/14/2015   Dyslipidemia 02/08/2015   History of renal cell carcinoma 02/08/2015   Thrombocytopenia 02/08/2015   History of chicken pox    Essential hypertension 12/25/2011   Stage 3 chronic kidney disease 12/25/2011    PCP: Domenica Harlene LABOR, MD   REFERRING PROVIDER: Evonnie Asberry RAMAN, DO   REFERRING DIAG: G20.A2 (ICD-10-CM) - Parkinson's disease without dyskinesia, with fluctuating manifestations (HCC)   THERAPY DIAG:  Parkinson's disease without dyskinesia, with fluctuations (HCC)  Other abnormalities of gait and mobility  Unsteadiness on feet  Muscle weakness (generalized)  Repeated falls  RATIONALE FOR EVALUATION AND TREATMENT: Rehabilitation  ONSET DATE: PD diagnosis 5+ years ago   NEXT MD VISIT: 04/14/24   SUBJECTIVE:                                                                                                                                                                                                         SUBJECTIVE STATEMENT: Pt had a carbidopa -levodopa  pump installed on Friday.  He and his wife are already noting benefits - sleeping better (despite only being able to sleep on his  back or right side) and moving better with less shaking/tremors.  EVAL:  Pt reports worsening of his Parkinson's disease. His biggest concern is that he is having a harder time walking  and had to switch to a 2WW/RW from his cane.  He states his wife also notes more forgetfulness/memory issues. He recognizes increased freezing of gait, typcially worse in the mornings and lessening as the day progresses.  He is worried about falling and injuring himself to the point where he will need someone to take care of him.  Pt accompanied by: significant other - wife [facility]) in waiting room   PAIN: Are you having pain? 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 Yes. Number of falls 2 (both while using RW to go around a corner)  LIVING ENVIRONMENT: Lives with: lives with their spouse Lives in: House/apartment Stairs: No Has following equipment at home: Single point cane, Environmental Consultant - 2 wheeled, Grab bars, and Recumbent Bike  OCCUPATION: Retired  PLOF: Independent with household mobility with device, Independent with community mobility with device, Needs assistance with homemaking, and Leisure: mostly sedentary recently     PATIENT GOALS: To get myself going to help bring back some of what I've lost.   OBJECTIVE: (objective measures completed at initial evaluation unless otherwise dated)  DIAGNOSTIC FINDINGS:  09/15/23 - CT CERVICAL SPINE WITHOUT CONTRAST (s/p fall, hit right-side) IMPRESSION: 1. No acute cervical spine fracture. 2. Extensive multilevel cervical degenerative changes.  09/15/23 - CT HEAD WITHOUT CONTRAST (s/p fall, hit right-side) IMPRESSION: 1. No acute  intracranial process.  09/15/23 - RIGHT SHOULDER - 2+ VIEW (s/p fall, hit right-side) IMPRESSION: 1. No acute fracture. 2. Bone hypertrophy likely from remote trauma to the distal clavicle. Heterotopic bone formation likely from remote trauma to the coracoclavicular ligament. 3. Mild high-riding humeral head which may be secondary to a superior rotator cuff tear. 4. Mild glenohumeral osteoarthritis.  09/15/23 - LEFT FOOT - COMPLETE 3+ VIEW (L foot pain) IMPRESSION: 1. No significant change from prior.  No acute fracture. 2. Mild hallux valgus. 3. Postsurgical changes of second and third PIP arthrodesis.  09/29/23 - MR FOOT LEFT W WO CONTRAST FINDINGS: Postoperative change with metal artifact to the distal second, third, and fourth digits. No fracture or dislocation. No erosions. Mild second tarsometatarsal joint osteoarthritis with joint space narrowing and mild degenerative edema. The visualized marrow signal is otherwise unremarkable.   No obvious soft tissue wound/ulceration. This may be obscured by metal artifact. Moderate diffuse muscle atrophy. Mild likely reactive myositis. The tendons are unremarkable. Moderate to severe dorsal subcutaneous edema. No organized fluid collection.   IMPRESSION: The exam is slightly limited by metal artifact.   Moderate to severe dorsal subcutaneous edema. Correlate for lymphedema versus cellulitis.   No obvious soft tissue wound/ulceration or evidence of osteomyelitis. If continued clinical concern, CT scan and/or bone scan could be performed to further characterize. This would limit the metal artifact.   Diffuse muscle atrophy.   Mild degenerative change.  COGNITION: Overall cognitive status: Impaired - decreased short-term memory and recall of new information    SENSATION: WFL  COORDINATION: B heel-toe mildly diminished. Impaired heel to shin bilaterally.  MUSCLE TONE: Increased LE muscle tone noted during attempts at  PROM/flexibility assessment.   POSTURE:  rounded shoulders, forward head, increased thoracic kyphosis, and flexed trunk   MUSCLE LENGTH: Hamstrings: mod tight B  ITB: mild tight B Piriformis: mod/severe tight B Hip flexors: mod/severe tight B Quads: mild/mod tight B Heelcord: mild tight B  LOWER EXTREMITY ROM:    Mildly limited B hip and ankle ROM (R>L), mostly due to limited muscle flexibility as above.   LOWER EXTREMITY MMT:    MMT Right eval Left eval R 01/27/24 L 01/27/24  Hip flexion 4+ 4+ 5 5  Hip extension 4- 4 4+ * 4+ *  Hip abduction 4 4 5  * 5 *  Hip adduction 4+ 4+ 5 * 5 *  Hip internal rotation 5 5 5 5   Hip external rotation 4+ 4+ 5 5  Knee flexion 4+ 4+ 5 5  Knee extension 4+ 4+ 5 5  Ankle dorsiflexion 4+ 4+ 5 5  Ankle plantarflexion      Ankle inversion      Ankle eversion      (Blank rows = not tested, * - tested in sitting d/t new carbidopa -levodopa  pump)  BED MOBILITY:  Minimal assist overall  TRANSFERS: Assistive device utilized: Environmental Consultant - 2 wheeled  Sit to stand: Modified independence and SBA - cues to push up from chair rather than pull up on walker Stand to sit: Modified independence Chair to chair: NT Floor: NT  GAIT: Distance walked: Clinic distances Assistive device utilized: Environmental Consultant - 2 wheeled Level of assistance: Modified independence Gait pattern: step through pattern, decreased stride length, shuffling, festinating, and trunk flexed Comments: Patient reports intermittent freezing episodes, most common in the morning and subsiding as the day progresses  FUNCTIONAL TESTS:  5 times sit to stand: 12/13/23 - 23 seconds back on heels Timed up and go (TUG): 12/13/23 - no device 23 seconds lost balance with turn Manual =  Cognitive =  10 meter walk test: 23.25 sec with 2WW Gait Speed: 1.41 ft/sec w/o AD, limited community ambulator; gait speed of <1.8 ft/sec indicates the risk for recurrent falls  Berg Balance Scale: 12/13/23 - 41/56, 37-45 =  Significant (>80%) fall risk  Functional gait assessment: TBA Interpretation of FGA scores: Non-Specific Older Adults Cutoff Score: <=22/30 = risk of falls Parkinson's Disease Cutoff score <15/30= fall risk (Hoehn & Yahr 1-4)  Minimally Clinically Important Difference (MCID)  Stroke (acute, subacute, and chronic) = MDC: 4.2 points Vestibular (acute) = MDC: 6 points Community Dwelling Older Adults =  MCID: 4 points Parkinson's Disease  =  MDC: 4.3 points  (Academy of Neurologic Physical Therapy (nd). Functional Gait Assessment. Retrieved from https://www.neuropt.org/docs/default-source/cpgs/core-outcome-measures/function-gait-assessment-pocket-guide-proof9-(2).pdf?sfvrsn=b46f35043_0.)  Baseline as of D/C from last PT episode (11/20/22): 11/15/22: 5xSTS = 13.88 sec w/o UE assist (occasional hands on knees) TUG:  Normal = 9.93 sec Manual = 10.19 sec Cognitive = 13.16 sec (unable to complete cognitive task) : w/o AD = 10.62 sec, 9.32 sec (fastest comfortable speed w/o AD) with SPC = 11.59 sec with B hiking poles = 11.62 sec Gait speed: 3.09 ft/sec w/o AD  3.52 ft/sec (fastest comfortable speed w/o AD) 2.83 ft/sec with SPC  2.82 ft/sec with B hiking poles    11/20/22: Berg = 50/56, 46-51 moderate risk for falls (>50%)  FGA = 21/30, 19-24 = medium risk for fall   PATIENT SURVEYS:  ABC scale: The Activities-Specific Balance Confidence (ABC) Scale 0% 10 20 30  40 50 60 70 80 90 100% No confidence<->completely confident  "How confident are you that you will not lose your balance or become unsteady when you . . .  Date tested 11/28/23 *confidence with RW 01/14/24 *confidence with RW/4WW  Walk around  the house 80% 90%  2. Walk up or down stairs 50% 80%  3. Bend over and pick up a slipper from in front of a closet floor 70% 80%  4. Reach for a small can off a shelf at eye level 100% 100%  5. Stand on tip toes and reach for something above your head 60% 70%  6. Stand on a chair and  reach for something 0% 20%  7. Sweep the floor 0% 60%  8. Walk outside the house to a car parked in the driveway 80% 100%  9. Get into or out of a car 90% 100%  10. Walk across a parking lot to the mall 70% 90%  11. Walk up or down a ramp 70% 80%  12. Walk in a crowded mall where people rapidly walk past you 60% 90%  13. Are bumped into by people as you walk through the mall 70% 80%  14. Step onto or off of an escalator while you are holding onto the railing 80% 90%  15. Step onto or off an escalator while holding onto parcels such that you cannot hold onto the railing 0% 30%  16. Walk outside on icy sidewalks 0% 0%  Total: #/16 880 / 1600 = 55.0 % 1160 / 1600 = 72.5 %  50-80% = moderate level of physical functioning <69% indicates risk for recurrent falls in PD   TODAY'S TREATMENT:   01/27/2024  THERAPEUTIC EXERCISE: To improve strength and endurance.    NuStep - L6 x 6 min - UE/LE to promote reciprocal movement patterns  GAIT TRAINING: To normalize gait pattern and improve safety with 4WW/rollator . 600' with 4WW - good walker proximity but cues for increased hip and knee flexion with heel strike on weight acceptance  THERAPEUTIC ACTIVITIES: To improve functional performance.  Demonstration, verbal and tactile cues throughout for technique. Fwd & back step over 1/2 FR with weight shift x 20 bil to promote increased foot clearance and heel strike during gait Alt toe clears with heel strike on top of 9 stool x 20 Standing heel-toe raises x 10 - limited lift and pt leaning back for toes raises Standing negative toe raises to neutral with heels on 1/2 FR x 10 and off edge of 4 step x 10 Standing negative heel raises to neutral with forefeet on 1/2 FR 2 x 10 *UE support for all standing exercises on wall ladder for balance   01/20/2024  THERAPEUTIC EXERCISE: To improve strength and endurance.    Rec Bike - L3 x 6 min  NEUROMUSCULAR RE-EDUCATION: To improve balance, coordination,  kinesthesia, posture, proprioception, reduce fall risk, amplitude of movement, speed of movement to reduce bradykinesia, and reduce rigidity.  Quadruped PWR! Moves: Up x 10 Rock x 10 Twist x 10 - repeated cues for sequencing  Step - unable  PWR! Stepping: Forward x 10 bil with single UE support on counter Forward x 10 bil w/o UE support - 1 LOB requiring PT assist to correct Backward x 10 bil with single UE support on counter Step-through forward & backward x 10 bil with single UE support on counter B side-stepping along counter but no need for UE support 5 x 10' Step, twist and reach x 10 to alternating sides keeping 1 hand on counter   01/14/2024 - 10th visit PN THERAPEUTIC EXERCISE: To improve strength and endurance.   NuStep - L6 x 6 min - UE/LE to promote reciprocal movement patterns  THERAPEUTIC ACTIVITIES: To improve functional performance.  Demonstration, verbal and tactile cues throughout for technique. ABC scale: 1160 / 1600 = 72.5 %, indicates moderate level physical functioning 5xSTS = 14.41 sec w/o UE assist   PHYSICAL PERFORMANCE TEST or MEASUREMENT: Berg Balance Test  Sit to Stand Able to stand without using hands and stabilize independently   Standing Unsupported Able to stand safely 2 minutes   Sitting with Back Unsupported but Feet Supported on Floor or Stool Able to sit safely and securely 2 minutes   Stand to Sit Sits safely with minimal use of hands   Transfers Able to transfer safely, minor use of hands   Standing Unsupported with Eyes Closed Able to stand 10 seconds safely   Standing Unsupported with Feet Together Able to place feet together independently and stand 1 minute safely   From Standing, Reach Forward with Outstretched Arm Can reach forward >12 cm safely (5)   From Standing Position, Pick up Object from Floor Able to pick up shoe safely and easily   From Standing Position, Turn to Look Behind Over each Shoulder Looks behind one side only/other side  shows less weight shift   Turn 360 Degrees Able to turn 360 degrees safely but slowly   Standing Unsupported, Alternately Place Feet on Step/Stool Able to stand independently and complete 8 steps >20 seconds   Standing Unsupported, One Foot in Front Able to take small step independently and hold 30 seconds   Standing on One Leg Tries to lift leg/unable to hold 3 seconds but remains standing independently   Total Score 46   Berg comment: 46-51 = Moderate (>50%) fall risk       SELF CARE: Provided education to improve safety with use of 4WW/rollator for assistive device and to reduce fall risk.  Patient arrived to PT today with new recently purchased 4WW/rollator today (just set up last night).  Provided cues/instruction in safe use of 4WW/rollator including: Safe transfer technique with walker  Need to lock brakes prior to initiation of transfer  Proper hand placement on seating surface/armrest of chair during both sit to stand and stand to sit transition Upright posture and proper foot placement between rear wheels of walker during gait  GAIT TRAINING: To normalize gait pattern and improve safety with 4WW/rollator.  600' with 4 WW/rollator on level surfaces indoors as well as level and uneven surfaces outdoors including sidewalks, inclines, pavement and grass - repeated cues for upright posture and proper foot placement between rear wheels of walker as well as for heelstrike on weight acceptance to increase foot clearance and reduce scuffing/shuffling of feet Curb negotiation - instructions provided for safe approach and negotiation of curb with 4WW/rollator including proper use of brakes during transition up/down   01/09/2024  THERAPEUTIC EXERCISE: To improve strength and endurance.   NuStep - L6 x 6 min - UE/LE to promote reciprocal movement patterns  PHYSICAL PERFORMANCE TEST or MEASUREMENT: Dynamic Gait Index  Level Surface Mild Impairment   Change in Gait Speed Mild Impairment   Gait  with Horizontal Head Turns Mild Impairment   Gait with Vertical Head Turns Mild Impairment   Gait and Pivot Turn Mild Impairment   Step Over Obstacle Mild Impairment   Step Around Obstacles Mild Impairment   Steps Moderate Impairment   Total Score 15   DGI comment: Scores of 19 or less are predictive of falls in older community living adults      GAIT TRAINING: To normalize gait pattern and improve safety with 4WW/rollator.  = 8.75  sec with RW Gait speed = 3.75 ft/sec with RW Gait training within clinic with 4WW/rollator: Focusing on posture and walker proximity for improved directional control while turning and negotiating obstacles.   Discussed benefits and drawbacks of use of 4WW as compared to RW in relation to stability and ease of mobility particularly on uneven surfaces - Will plan to introduce 4 WW with outdoor surfaces within next few visits weather permitting. Provided education on safe use of 4WW/rollator brakes during sit to stand transitions and to control speed of walker during gait.   01/06/2024  THERAPEUTIC EXERCISE: To improve strength and endurance.   NuStep - L6 x 6 min - UE/LE to promote reciprocal movement patterns  NEUROMUSCULAR RE-EDUCATION: To improve coordination, kinesthesia, posture, amplitude of movement, speed of movement to reduce bradykinesia, and reduce rigidity.  Prone PWR! Moves (2 pillows under abdomen/hips to reduce low back strain): Up x 10 Rock x 10 Twist x 10 bil Step x 10   01/02/2024  THERAPEUTIC EXERCISE: To improve strength and endurance.   NuStep - L6 x 6 min - UE/LE to promote reciprocal movement patterns  THERAPEUTIC ACTIVITIES: To improve functional performance.  Demonstration, verbal and tactile cues throughout for technique. Reviewed education on tips to prevent freezing with transfers and gait - simulated approach from foyer into den navigating around furniture to his usual chair where he was experiencing the freezing  yesterday.    NEUROMUSCULAR RE-EDUCATION: To improve coordination, kinesthesia, posture, proprioception, amplitude of movement, speed of movement to reduce bradykinesia, and reduce rigidity. Supine PWR! Moves: Up x 10 Rock x 10 Twist x 10 Step x 10    12/30/2023  THERAPEUTIC EXERCISE: To improve strength and endurance.    Rec Bike - L3 x 6 min  SELF CARE: Provided education to reduce fall risk and to promote safe home environment. Reviewed the Check for Safety - Home Fall Prevention Checklist for Older Adults to help identify fall risk hazards in the home along with strategies to reduce fall risk at home  Reviewed education on tips to prevent freezing with transfers and gait.    NEUROMUSCULAR RE-EDUCATION: To improve balance, coordination, kinesthesia, posture, proprioception, reduce fall risk, amplitude of movement, speed of movement to reduce bradykinesia, and reduce rigidity. Seated PWR! Moves: Up x 10 Rock x 10 Twist x 10 Step x 10 PWR! Sit to stand x 10 Standing PWR! Moves: (standing in front on mat table with chair in front for intermittent UE support as needed) Up x 10 Rock x 10 Twist x 10 Step x 10    12/26/2023 THERAPEUTIC EXERCISE: To improve strength, endurance, and flexibility.  Demonstration, verbal and tactile cues throughout for technique.  NuStep - L6 x 6 min - UE/LE to promote reciprocal movement patterns Seated hip hinge HS stretch x 30 bil Seated lunge position hip flexor/quad stretch x 30 bil Seated GTB HS curls x 10 bil Seated GTB LAQ x 10 bil  MANUAL THERAPY: To promote normalized muscle tension, improved flexibility, pain modulation, and reduced pain utilizing connective tissue massage, therapeutic massage, and myofascial release. Foam roller to B quads x ~2 min Roller stick to R/L hamstrings x 1-2 min  NEUROMUSCULAR RE-EDUCATION: To improve balance, coordination, kinesthesia, posture, proprioception, reduce fall risk, and amplitude of  movement. Standing 4-way GTB SLR with RW support for balance x 10   12/23/2023  THERAPEUTIC EXERCISE: To improve strength and endurance.  Demonstration, verbal and tactile cues throughout for technique.  NuStep - L6 x 6 min Seated  GTB B scap retraction + shoulder row x 10  Seated GTB B scap retraction + shoulder extension x 10  Seated bent over scap retraction + B shoulder extension x 10 with 5# db bilaterally Seated GTB hip ABD/ER clam 2 x 10 Seated GTB hip flexion march x 10  NEUROMUSCULAR RE-EDUCATION: To improve balance, coordination, kinesthesia, posture, proprioception, reduce fall risk, amplitude of movement, speed of movement to reduce bradykinesia, and reduce rigidity. Seated PWR! Moves: Up x 10 Rock x 10 Twist x 10 Step x 10 Standing hip extension with looped GTB at ankles x 10, UE support on RW (encouraged counter support at home) Standing hip abduction with looped GTB at ankles x 10, UE support on RW  THERAPEUTIC ACTIVITIES: To improve functional performance.  Demonstration, verbal and tactile cues throughout for technique. Sit to stand from Airex pad in chair w/o UE assist + GTB hip ABD isometric at distal thighs x 10   12/19/23 Nustep L5x71min UE/LE Seated Lumbar flexion stretch green pball x 30' 3 way Seated hamstring stretch x 30' BLE Seated figure 4 stretch x 30 BLE Seated side bend stretch x 30' B Standing runner stretch x 30' Seated thoracic ext HBH 5x10 Standing pallof press GTB doubled x 10  [employer] pew weight shifts x 10   12/13/23 Nustep level 5 x 6 minutes TUG =  23 seconds, unsteady with turn no device but did touch wall 5XSTS = 23 seconds  really back on heels Berg = 41/56 On airex standing On airex ball toss, had to have another person for CGA tended to lose balance to the back Step over side to sided and forward back PVC T CGA Direction changes   11/19/2023  SELF CARE:  Reviewed eval findings and role of PT in addressing identified deficits  as well as need for further assessment of balance.  Provided education on safe hand placement with RW during transfers to reduce fall risk.   PATIENT EDUCATION:  Education details: continue with current HEP and PWR! Moves - quadruped  Person educated: Patient Education method: Explanation, Demonstration, and Verbal cues Education comprehension: verbalized understanding, returned demonstration, verbal cues required, and needs further education  HOME EXERCISE PROGRAM: Access Code: [email or web address] URL: [email or web address] Date: 12/30/2023 Prepared by: Elijah Hidden  Exercises - Supine Lower Trunk Rotation  - 2 x daily - 7 x weekly - 2 sets - 10 reps - 10 sec hold - Seated Thoracic Lumbar Extension with Pectoralis Stretch  - 2 x daily - 7 x weekly - 2 sets - 10 reps - 5 sec hold - Seated Hamstring Stretch  - 2 x daily - 7 x weekly - 3 reps - 30 sec hold - Seated Figure 4 Piriformis Stretch  - 2 x daily - 7 x weekly - 3 reps - 30 sec hold - Standing Gastroc Stretch at Counter  - 2 x daily - 7 x weekly - 3 reps - 30 sec hold - Seated Quadratus Lumborum Stretch in Chair  - 2 x daily - 7 x weekly - 3 reps - 30 sec hold - Seated Flexion Stretch with Swiss Ball  - 2 x daily - 7 x weekly - 3 reps - 30 sec hold - Seated Thoracic Flexion and Rotation with Swiss Ball  - 2 x daily - 7 x weekly - 3 reps - 30 sec hold - Seated Bent Over Shoulder Row with Dumbbells  - 1 x daily - 3 x weekly - 2 sets - 10 reps -  3 sec hold - Seated Shoulder Extension with Dumbbells  - 1 x daily - 3 x weekly - 2 sets - 10 reps - 3 sec hold - Standing Hip Extension with Resistance at Ankles and Counter Support  - 1 x daily - 3 x weekly - 2 sets - 10 reps - 3 sec hold - Standing Hip Abduction with Resistance at Ankles and Counter Support  - 1 x daily - 3 x weekly - 2 sets - 10 reps - 3 sec hold  Patient Education - Tips to reduce freezing episodes with standing or walking - Check for Safety  PWR! Moves: -  Sitting - Standing - Supine - Prone   ASSESSMENT:  CLINICAL IMPRESSION: Dodge returns to PT after having carbidopa -levodopa  pump installed on Friday, 01/24/2024.  He and his wife report he has been sleeping better since the pump and have noticed he is moving better with less tremor/shaking noted.  Reviewed gait training with 4WW/rollator with good posture and walker proximity observed however patient lacking hip and knee flexion with good heel strike therefore worked on improving these components of gait as well as ankle strengthening to facilitate improved foot clearance and heel strike on weight acceptance during gait.  Alexie will benefit from continued skilled PT to address ongoing postural, strength and balance deficits to improve mobility and activity tolerance with decreased pain interference and decreased risk for falls.   EVAL: TABOR DENHAM is a 78 y.o. male who was referred to physical therapy for evaluation and treatment for Parkinson's disease.  Patient first diagnosed with Parkinson's 5+ years ago.  He has completed 2 prior PT episodes in 2022/2023 and 2024 within the [facility].  Most recent PT episode for Parkinson's disease was 09/25/2022 - 11/20/2022.  Since his last PT episode, he reports worsening of his PD leading to increased gait impairments and need to switch from single-point cane to RW/2WW.  Patient presents with physical impairments of decreased timing and coordination of gait, impaired ambulation, impaired standing balance, abnormal posture, bradykinesia with transfers, impaired activity tolerance, LE weakness, postural instability and decreased safety awareness impacting safe and independent functional mobility.  Examination revealed patient is at risk for falls and functional decline as evidenced by the following objective test measures: Gait speed 1.41 ft/sec, (2.62 ft/sec is needed for community access and <1.8 ft/sec is indicative of risk for recurrent falls),  which demonstrates a significant decline from gait speed of.  He reports increased incidence of freezing of gait, most common in the morning and subsiding as the day progresses.  He has had 2 falls in the past 6 months, both of which occurred while turning with the RW.  Further balance testing with 5xSTS, TUG, Berg and/or DGI/FGA indicated however patient needing to use the bathroom preventing testing today.  ABC scale score of 55% indicates a moderate level of physical functioning, and score of <69% indicates risk for recurrent falls in PD.  Caelan will benefit from skilled PT to address above deficits to improve mobility and activity tolerance to help reach the maximal level of functional independence with mobility and gait with reduced risk for falls.  Patient and his wife demonstrates understanding of this POC and are in agreement with this plan.   OBJECTIVE IMPAIRMENTS: Abnormal gait, decreased activity tolerance, decreased balance, decreased coordination, decreased knowledge of condition, decreased knowledge of use of DME, decreased mobility, difficulty walking, decreased ROM, decreased strength, decreased safety awareness, increased fascial restrictions, impaired perceived functional ability, impaired  flexibility, improper body mechanics, postural dysfunction, and pain.   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   REHAB  POTENTIAL: Good  CLINICAL DECISION MAKING: Unstable/unpredictable  EVALUATION COMPLEXITY: High   GOALS: Goals reviewed with patient? Yes  SHORT TERM GOALS: Target date: 01/09/2024  Patient will be independent with initial HEP. Baseline: TBD 12/23/23 - Pt reports no concerns with current HEP exercises Goal status: MET - 01/09/24  2.  Patient will demonstrate decreased fall risk by scoring < 25 sec on TUG. Baseline: TBA Goal status: MET - 12/13/23 - 23 seconds  3.  Patient will be educated on strategies to decrease risk of falls.  Baseline:  Goal status: MET - 12/30/23  4.  Patient will verbalize tips to reduce freezing/festination with gait and turns. Baseline: Patient reports freezing episodes most common in the morning, subsiding as the day progresses. 12/30/23 - reviewed today 01/02/24 - simulated home navigation in places where he is more likely to experiencing freezing Goal status: MET - 01/14/24  LONG TERM GOALS: Target date: 02/20/2024  Patient will be independent with ongoing/advanced HEP for self-management at home incorporating PWR! Moves as indicated .  Baseline:  Goal status: IN PROGRESS - 01/06/24  2.  Patient will be able to ambulate 600' with LRAD with good safety to access community.  Baseline:  01/14/24 - new 4WW/rollator introduced today - cues necessary for safe transfer technique with walker as well as upright posture and proper foot placement between rear wheels of walker with cues for heelstrike on weight acceptance to increase foot clearance Goal status: IN PROGRESS - 01/27/24 - able to walk in excess of 600 feet with 4WW/rollator however limited hip and knee flexion and decreased heel strike observed R>L  3.  Patient will be able to step up/down curb safely with LRAD for safety with community ambulation.  Baseline:  Goal status: IN PROGRESS - 01/14/24 - instructions provided for safe approach and negotiation of curb with 4WW/rollator including proper use of  brakes during transition up/down  4.  Patient will demonstrate gait speed of >/= 1.8 ft/sec (0.55 m/s) to be a safe limited community ambulator with decreased risk for recurrent falls.  Baseline: 1.41 ft/sec with RW Goal status: MET - 01/09/24 - 3.75 ft/sec with RW  5.  Patient will improve 5xSTS time to </= 16 seconds to demonstrate improved functional strength and transfer efficiency. Baseline: 12/13/23 - 23 seconds  Goal status: MET - 01/14/24 - 14.41 sec  6.  Patient will demonstrate at least 19/24 on DGI or 19/30 on FGA to improve gait stability and reduce risk for falls. Baseline: 01/09/24 - DGI = 15/24 Goal status: IN PROGRESS - 01/09/24  7.  Patient will improve Berg score by at least 8 points to improve safety and stability with ADLs in standing and reduce risk for falls.   Baseline: 12/13/23 - 41/56 Goal status: IN PROGRESS - 01/14/24 - 46/56  8.  Patient will report >/= 69% on ABC scale to demonstrate improved balance confidence and decreased risk for falls. Baseline: 880 /  1600 = 55.0 % Goal status: MET - 01/14/24 - 1160 / 1600 = 72.5 %  9. Patient will verbalize understanding of local Parkinson's disease community resources, including community fitness post d/c. Baseline:  Goal status: INITIAL   PLAN:  PT FREQUENCY: 2x/week  PT DURATION: 12 weeks  PLANNED INTERVENTIONS: 97164- PT Re-evaluation, 97750- Physical Performance Testing, 97110-Therapeutic exercises, 97530- Therapeutic activity, [id number]- Neuromuscular re-education, 97535- Self Care, 02859- Manual therapy, [phone]- Gait training, [id number]- Electrical stimulation (unattended), 97035- Ultrasound, 02966- Ionotophoresis 4mg /ml Dexamethasone , 79439 (1-2 muscles), 20561 (3+ muscles)- Dry Needling, Patient/Family education, Balance training, Stair training, Taping, Joint mobilization, Spinal mobilization, Cryotherapy, and Moist heat  PLAN FOR NEXT SESSION: Review 4WW/rollator safety as indicated including proper gait pattern PRN;  LE flexibility; core/postural strengthening; review seated, standing, supine and prone PWR! Moves PRN and re-introduce remaining position (quadruped) as tolerated; review education on tips to reduce freezing as indicated   Elijah CHRISTELLA Hidden, PT 01/27/2024, 4:27 PM

## 2024-01-30 ENCOUNTER — Other Ambulatory Visit (HOSPITAL_BASED_OUTPATIENT_CLINIC_OR_DEPARTMENT_OTHER): Payer: Self-pay

## 2024-01-30 ENCOUNTER — Ambulatory Visit: Admitting: Physical Therapy

## 2024-01-30 DIAGNOSIS — M6281 Muscle weakness (generalized): Secondary | ICD-10-CM | POA: Diagnosis not present

## 2024-01-30 DIAGNOSIS — R2689 Other abnormalities of gait and mobility: Secondary | ICD-10-CM

## 2024-01-30 DIAGNOSIS — G20A2 Parkinson's disease without dyskinesia, with fluctuations: Secondary | ICD-10-CM | POA: Diagnosis not present

## 2024-01-30 DIAGNOSIS — R296 Repeated falls: Secondary | ICD-10-CM

## 2024-01-30 DIAGNOSIS — R2681 Unsteadiness on feet: Secondary | ICD-10-CM

## 2024-01-30 NOTE — Therapy (Signed)
 OUTPATIENT PHYSICAL THERAPY PARKINSON'S TREATMENT    Patient Name: Vincent Walker MRN: [id number] DOB:[birth year], 78 y.o., male Today's Date: 01/30/2024   END OF SESSION:  PT End of Session - 01/30/24 1315     Visit Number 13    Date for Recertification  02/20/24    Authorization Type Medicare & Federal BCBS    Progress Note Due on Visit 20    PT Start Time 1316    PT Stop Time 1401    PT Time Calculation (min) 45 min    Activity Tolerance Patient tolerated treatment well    Behavior During Therapy [facility] for tasks assessed/performed;Flat affect                 Past Medical History:  Diagnosis Date   Adenomatous polyp of ascending colon 10/09/2018   Allergies 07/07/2018   Arthritis    Astigmatism of both eyes with presbyopia 08/21/2022   Blepharitis of upper and lower eyelids of both eyes 01/10/2016   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 Required nasal packing a couple of days ago.  Had a similar episode of epistaxis a little over a year ago.  At that visit, I could not see the exact bleeding spot. EXAMINATION after packing removal reveals several excoriated areas anteriorly but I was unable to see t   Essential hypertension 12/25/2011   Eustachian tube dysfunction, left 02/21/2021   Flank pain 07/19/2022   History of blood transfusion 2010   After Kidney surgery   History of chicken pox    History of renal cell carcinoma 02/08/2015   diagnosed and removed in 2010 Right Monitored by Dr Tanda Moats of urology at [facility] Dr Claudine Alley, nephrology at [facility]   Hyperglycemia 12/23/2015   Increased thyroid  stimulating hormone (TSH) level 06/07/2017   Ingrown left big  toenail 02/14/2015   Left foot pain 02/14/2015   MVP (mitral valve prolapse) 05/14/2016   Parkinson's disease (HCC) 12/23/2015   Posterior vitreous detachment of both eyes 08/21/2022   Primary open-angle glaucoma, left eye, severe stage 05/05/2021   Primary open-angle glaucoma, right eye, moderate stage 01/10/2016   Pseudophakia, both eyes 08/21/2022   Right hip pain 12/12/2016   Stage 3 chronic kidney disease 12/25/2011   Thrombocytopenia 02/08/2015   Tremor 02/14/2015   Past Surgical History:  Procedure Laterality Date   APPENDECTOMY  1995   CATARACT EXTRACTION, BILATERAL     COLONOSCOPY     COLONOSCOPY WITH PROPOFOL  N/A 12/17/2018   Procedure: COLONOSCOPY WITH PROPOFOL ;  Surgeon: Wilhelmenia Aloha Raddle., MD;  Location: [facility] ENDOSCOPY;  Service: Gastroenterology;  Laterality: N/A;   ENDOSCOPIC MUCOSAL RESECTION N/A 12/17/2018   Procedure: ENDOSCOPIC MUCOSAL RESECTION;  Surgeon: Wilhelmenia Aloha Raddle., MD;  Location: [facility] ENDOSCOPY;  Service: Gastroenterology;  Laterality: N/A;   HEMOSTASIS CLIP PLACEMENT  12/17/2018   Procedure: HEMOSTASIS CLIP PLACEMENT;  Surgeon: Wilhelmenia Aloha Raddle., MD;  Location: [facility] ENDOSCOPY;  Service: Gastroenterology;;   left knee scope  2003   POLYPECTOMY  12/17/2018   Procedure: POLYPECTOMY;  Surgeon: Wilhelmenia Aloha Raddle., MD;  Location: [facility] ENDOSCOPY;  Service: Gastroenterology;;   right knee scope  1999   SUBMUCOSAL LIFTING INJECTION  12/17/2018   Procedure: SUBMUCOSAL LIFTING INJECTION;  Surgeon: Wilhelmenia Aloha Raddle., MD;  Location: [facility] ENDOSCOPY;  Service: Gastroenterology;;   TOE SURGERY Left    metal 2nd toe- and top of foot- straighten bone   TONSILLECTOMY     TOTAL KNEE ARTHROPLASTY Left 07/06/2014   TOTAL NEPHRECTOMY Right    Patient Active Problem List   Diagnosis Date Noted   Cellulitis of left foot 09/17/2023   Dementia due to Parkinson's disease 11/20/2022   Dry eye syndrome of both eyes 08/28/2022   Astigmatism of both eyes with  presbyopia 08/21/2022   Choroidal nevus of right eye 08/21/2022   Posterior vitreous detachment of both eyes 08/21/2022   Pseudophakia, both eyes 08/21/2022   Primary open-angle glaucoma, left eye, severe stage 05/05/2021   Eustachian tube dysfunction, left 02/21/2021   Early dry stage nonexudative age-related macular degeneration of both eyes 03/09/2019   Adenomatous polyp of ascending colon 10/09/2018   Epistaxis 07/07/2018   Allergies 07/07/2018   Obesity 06/15/2018   Increased thyroid  stimulating hormone (TSH) level 06/07/2017   MVP (mitral valve prolapse) 05/14/2016   Blepharitis of upper and lower eyelids of both eyes 01/10/2016   Primary open-angle glaucoma, right eye, moderate stage 01/10/2016   Parkinson's disease (HCC) 12/23/2015   Hyperglycemia 12/23/2015   Tremor 02/14/2015   Dyslipidemia 02/08/2015   History of renal cell carcinoma 02/08/2015   Thrombocytopenia 02/08/2015   History of chicken pox    Essential hypertension 12/25/2011   Stage 3 chronic kidney disease 12/25/2011    PCP: Domenica Harlene LABOR, MD   REFERRING PROVIDER: Evonnie Asberry RAMAN, DO   REFERRING DIAG: G20.A2 (ICD-10-CM) - Parkinson's disease without dyskinesia, with fluctuating manifestations (HCC)   THERAPY DIAG:  Parkinson's disease without dyskinesia, with fluctuations (HCC)  Other abnormalities of gait and mobility  Unsteadiness on feet  Muscle weakness (generalized)  Repeated falls  RATIONALE FOR EVALUATION AND TREATMENT: Rehabilitation  ONSET DATE: PD diagnosis 5+ years ago   NEXT MD VISIT: 04/14/24   SUBJECTIVE:                                                                                                                                                                                                         SUBJECTIVE STATEMENT: Pt reports increased low back discomfort with now having to sleep on his back due to the new carbidopa -levodopa  pump.  EVAL:  Pt reports worsening of his  Parkinson's disease. His biggest concern is that he is having a harder time walking and had to switch to a 2WW/RW from his cane.  He states his wife also notes  more forgetfulness/memory issues. He recognizes increased freezing of gait, typcially worse in the mornings and lessening as the day progresses.  He is worried about falling and injuring himself to the point where he will need someone to take care of him.  Pt accompanied by: significant other - wife [facility]) in waiting room   PAIN: Are you having pain? 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 Yes. Number of falls 2 (both while using RW to go around a corner)  LIVING ENVIRONMENT: Lives with: lives with their spouse Lives in: House/apartment Stairs: No Has following equipment at home: Single point cane, Environmental Consultant - 2 wheeled, Grab bars, and Recumbent Bike  OCCUPATION: Retired  PLOF: Independent with household mobility with device, Independent with community mobility with device, Needs assistance with homemaking, and Leisure: mostly sedentary recently     PATIENT GOALS: To get myself going to help bring back some of what I've lost.   OBJECTIVE: (objective measures completed at initial evaluation unless otherwise dated)  DIAGNOSTIC FINDINGS:  09/15/23 - CT CERVICAL SPINE WITHOUT CONTRAST (s/p fall, hit right-side) IMPRESSION: 1. No acute cervical spine fracture. 2. Extensive multilevel cervical degenerative changes.  09/15/23 - CT HEAD WITHOUT CONTRAST (s/p fall, hit right-side) IMPRESSION: 1. No acute intracranial process.  09/15/23 - RIGHT SHOULDER - 2+ VIEW (s/p fall, hit right-side) IMPRESSION: 1. No  acute fracture. 2. Bone hypertrophy likely from remote trauma to the distal clavicle. Heterotopic bone formation likely from remote trauma to the coracoclavicular ligament. 3. Mild high-riding humeral head which may be secondary to a superior rotator cuff tear. 4. Mild glenohumeral osteoarthritis.  09/15/23 - LEFT FOOT - COMPLETE 3+ VIEW (L foot pain) IMPRESSION: 1. No significant change from prior.  No acute fracture. 2. Mild hallux valgus. 3. Postsurgical changes of second and third PIP arthrodesis.  09/29/23 - MR FOOT LEFT W WO CONTRAST FINDINGS: Postoperative change with metal artifact to the distal second, third, and fourth digits. No fracture or dislocation. No erosions. Mild second tarsometatarsal joint osteoarthritis with joint space narrowing and mild degenerative edema. The visualized marrow signal is otherwise unremarkable.   No obvious soft tissue wound/ulceration. This may be obscured by metal artifact. Moderate diffuse muscle atrophy. Mild likely reactive myositis. The tendons are unremarkable. Moderate to severe dorsal subcutaneous edema. No organized fluid collection.   IMPRESSION: The exam is slightly limited by metal artifact.   Moderate to severe dorsal subcutaneous edema. Correlate for lymphedema versus cellulitis.   No obvious soft tissue wound/ulceration or evidence of osteomyelitis. If continued clinical concern, CT scan and/or bone scan could be performed to further characterize. This would limit the metal artifact.   Diffuse muscle atrophy.   Mild degenerative change.  COGNITION: Overall cognitive status: Impaired - decreased short-term memory and recall of new information    SENSATION: WFL  COORDINATION: B heel-toe mildly diminished. Impaired heel to shin bilaterally.  MUSCLE TONE: Increased LE muscle tone noted during attempts at PROM/flexibility assessment.   POSTURE:  rounded shoulders, forward head, increased thoracic kyphosis, and flexed  trunk   MUSCLE LENGTH: Hamstrings: mod tight B ITB: mild tight B Piriformis: mod/severe tight B Hip flexors: mod/severe tight B Quads: mild/mod tight B  Heelcord: mild tight B  LOWER EXTREMITY ROM:    Mildly limited B hip and ankle ROM (R>L), mostly due to limited muscle flexibility as above.   LOWER EXTREMITY MMT:    MMT Right eval Left eval R 01/27/24 L 01/27/24  Hip flexion 4+ 4+ 5 5  Hip extension 4- 4 4+ * 4+ *  Hip abduction 4 4 5  * 5 *  Hip adduction 4+ 4+ 5 * 5 *  Hip internal rotation 5 5 5 5   Hip external rotation 4+ 4+ 5 5  Knee flexion 4+ 4+ 5 5  Knee extension 4+ 4+ 5 5  Ankle dorsiflexion 4+ 4+ 5 5  Ankle plantarflexion      Ankle inversion      Ankle eversion      (Blank rows = not tested, * - tested in sitting d/t new carbidopa -levodopa  pump)  BED MOBILITY:  Minimal assist overall  TRANSFERS: Assistive device utilized: Environmental Consultant - 2 wheeled  Sit to stand: Modified independence and SBA - cues to push up from chair rather than pull up on walker Stand to sit: Modified independence Chair to chair: NT Floor: NT  GAIT: Distance walked: Clinic distances Assistive device utilized: Environmental Consultant - 2 wheeled Level of assistance: Modified independence Gait pattern: step through pattern, decreased stride length, shuffling, festinating, and trunk flexed Comments: Patient reports intermittent freezing episodes, most common in the morning and subsiding as the day progresses  FUNCTIONAL TESTS:  5 times sit to stand: 12/13/23 - 23 seconds back on heels Timed up and go (TUG): 12/13/23 - no device 23 seconds lost balance with turn Manual =  Cognitive =  10 meter walk test: 23.25 sec with 2WW Gait Speed: 1.41 ft/sec w/o AD, limited community ambulator; gait speed of <1.8 ft/sec indicates the risk for recurrent falls  Berg Balance Scale: 12/13/23 - 41/56, 37-45 = Significant (>80%) fall risk  Functional gait assessment: TBA Interpretation of FGA scores: Non-Specific Older Adults  Cutoff Score: <=22/30 = risk of falls Parkinson's Disease Cutoff score <15/30= fall risk (Hoehn & Yahr 1-4)  Minimally Clinically Important Difference (MCID)  Stroke (acute, subacute, and chronic) = MDC: 4.2 points Vestibular (acute) = MDC: 6 points Community Dwelling Older Adults =  MCID: 4 points Parkinson's Disease  =  MDC: 4.3 points  (Academy of Neurologic Physical Therapy (nd). Functional Gait Assessment. Retrieved from https://www.neuropt.org/docs/default-source/cpgs/core-outcome-measures/function-gait-assessment-pocket-guide-proof9-(2).pdf?sfvrsn=b32f35043_0.)  Baseline as of D/C from last PT episode (11/20/22): 11/15/22: 5xSTS = 13.88 sec w/o UE assist (occasional hands on knees) TUG:  Normal = 9.93 sec Manual = 10.19 sec Cognitive = 13.16 sec (unable to complete cognitive task) : w/o AD = 10.62 sec, 9.32 sec (fastest comfortable speed w/o AD) with SPC = 11.59 sec with B hiking poles = 11.62 sec Gait speed: 3.09 ft/sec w/o AD  3.52 ft/sec (fastest comfortable speed w/o AD) 2.83 ft/sec with SPC  2.82 ft/sec with B hiking poles    11/20/22: Berg = 50/56, 46-51 moderate risk for falls (>50%)  FGA = 21/30, 19-24 = medium risk for fall   PATIENT SURVEYS:  ABC scale: The Activities-Specific Balance Confidence (ABC) Scale 0% 10 20 30  40 50 60 70 80 90 100% No confidence<->completely confident  "How confident are you that you will not lose your balance or become unsteady when you . . .  Date tested 11/28/23 *confidence with RW 01/14/24 *confidence with RW/4WW  Walk around the house 80% 90%  2. Walk up or down stairs 50% 80%  3. Bend over  and pick up a slipper from in front of a closet floor 70% 80%  4. Reach for a small can off a shelf at eye level 100% 100%  5. Stand on tip toes and reach for something above your head 60% 70%  6. Stand on a chair and reach for something 0% 20%  7. Sweep the floor 0% 60%  8. Walk outside the house to a car parked in the driveway 80%  100%  9. Get into or out of a car 90% 100%  10. Walk across a parking lot to the mall 70% 90%  11. Walk up or down a ramp 70% 80%  12. Walk in a crowded mall where people rapidly walk past you 60% 90%  13. Are bumped into by people as you walk through the mall 70% 80%  14. Step onto or off of an escalator while you are holding onto the railing 80% 90%  15. Step onto or off an escalator while holding onto parcels such that you cannot hold onto the railing 0% 30%  16. Walk outside on icy sidewalks 0% 0%  Total: #/16 880 / 1600 = 55.0 % 1160 / 1600 = 72.5 %  50-80% = moderate level of physical functioning <69% indicates risk for recurrent falls in PD   TODAY'S TREATMENT:   01/30/2024  THERAPEUTIC EXERCISE: To improve strength, endurance, and flexibility.  Demonstration, verbal and tactile cues throughout for technique.  NuStep - L6 x 6 min - UE/LE to promote reciprocal movement patterns Lumbar extension in standing with forearms on wall x 10  SELF CARE: Provided education on positioning for pain management.  Demonstrated supine sleeping position with pillow(s) under knees to take strain off back.  NEUROMUSCULAR RE-EDUCATION: To improve kinesthesia, posture, and reduce rigidity. Anatomical position spine roll-up on wall x 10 for postural correction and decreasing low back strain Anatomical position spine roll-up on inside of doorframe x 10 for postural correction and decreasing low back strain, allowing for increased scapular activation/shoulder extension  THERAPEUTIC ACTIVITIES: To improve functional performance.  Demonstration, verbal and tactile cues throughout for technique. Sequential step-over balance pebbles aiming for increased hip and knee flexion and heel strike on weight acceptance Standing toe raises x 10, cues to avoid leaning back at hips Standing heel-toe raises x 10 *UE support for all standing exercises on counter for balance  GAIT TRAINING: To normalize gait pattern  and improve safety with 4WW/rollator. 180' with 4WW/rollator focusing on carryover of above stepping pattern to promote increased foot clearance and heel strike on weight acceptance  55' with 4WW/rollator focusing on high-stepping pattern to promote increased foot clearance and heel strike on weight acceptance  31' with 4WW/rollator focusing on maintaining increased hip and knee flexion pattern to promote increased foot clearance while still achieving heel strike on weight acceptance    01/27/2024  THERAPEUTIC EXERCISE: To improve strength and endurance.    NuStep - L6 x 6 min - UE/LE to promote reciprocal movement patterns  GAIT TRAINING: To normalize gait pattern and improve safety with 4WW/rollator . 600' with 4WW - good walker proximity but cues for increased hip and knee flexion with heel strike on weight acceptance  THERAPEUTIC ACTIVITIES: To improve functional performance.  Demonstration, verbal and tactile cues throughout for technique. Fwd & back step over 1/2 FR with weight shift x 20 bil to promote increased foot clearance and heel strike during gait Alt toe clears with heel strike on top of 9 stool x 20 Standing heel-toe  raises x 10 - limited lift and pt leaning back for toes raises Standing negative toe raises to neutral with heels on 1/2 FR x 10 and off edge of 4 step x 10 Standing negative heel raises to neutral with forefeet on 1/2 FR 2 x 10 *UE support for all standing exercises on wall ladder for balance   01/20/2024  THERAPEUTIC EXERCISE: To improve strength and endurance.    Rec Bike - L3 x 6 min  NEUROMUSCULAR RE-EDUCATION: To improve balance, coordination, kinesthesia, posture, proprioception, reduce fall risk, amplitude of movement, speed of movement to reduce bradykinesia, and reduce rigidity.  Quadruped PWR! Moves: Up x 10 Rock x 10 Twist x 10 - repeated cues for sequencing  Step - unable  PWR! Stepping: Forward x 10 bil with single UE support on  counter Forward x 10 bil w/o UE support - 1 LOB requiring PT assist to correct Backward x 10 bil with single UE support on counter Step-through forward & backward x 10 bil with single UE support on counter B side-stepping along counter but no need for UE support 5 x 10' Step, twist and reach x 10 to alternating sides keeping 1 hand on counter   01/14/2024 - 10th visit PN THERAPEUTIC EXERCISE: To improve strength and endurance.   NuStep - L6 x 6 min - UE/LE to promote reciprocal movement patterns  THERAPEUTIC ACTIVITIES: To improve functional performance.  Demonstration, verbal and tactile cues throughout for technique. ABC scale: 1160 / 1600 = 72.5 %, indicates moderate level physical functioning 5xSTS = 14.41 sec w/o UE assist   PHYSICAL PERFORMANCE TEST or MEASUREMENT: Berg Balance Test  Sit to Stand Able to stand without using hands and stabilize independently   Standing Unsupported Able to stand safely 2 minutes   Sitting with Back Unsupported but Feet Supported on Floor or Stool Able to sit safely and securely 2 minutes   Stand to Sit Sits safely with minimal use of hands   Transfers Able to transfer safely, minor use of hands   Standing Unsupported with Eyes Closed Able to stand 10 seconds safely   Standing Unsupported with Feet Together Able to place feet together independently and stand 1 minute safely   From Standing, Reach Forward with Outstretched Arm Can reach forward >12 cm safely (5)   From Standing Position, Pick up Object from Floor Able to pick up shoe safely and easily   From Standing Position, Turn to Look Behind Over each Shoulder Looks behind one side only/other side shows less weight shift   Turn 360 Degrees Able to turn 360 degrees safely but slowly   Standing Unsupported, Alternately Place Feet on Step/Stool Able to stand independently and complete 8 steps >20 seconds   Standing Unsupported, One Foot in Front Able to take small step independently and hold 30  seconds   Standing on One Leg Tries to lift leg/unable to hold 3 seconds but remains standing independently   Total Score 46   Berg comment: 46-51 = Moderate (>50%) fall risk       SELF CARE: Provided education to improve safety with use of 4WW/rollator for assistive device and to reduce fall risk.  Patient arrived to PT today with new recently purchased 4WW/rollator today (just set up last night).  Provided cues/instruction in safe use of 4WW/rollator including: Safe transfer technique with walker  Need to lock brakes prior to initiation of transfer  Proper hand placement on seating surface/armrest of chair during both sit to stand and  stand to sit transition Upright posture and proper foot placement between rear wheels of walker during gait  GAIT TRAINING: To normalize gait pattern and improve safety with 4WW/rollator.  600' with 4 WW/rollator on level surfaces indoors as well as level and uneven surfaces outdoors including sidewalks, inclines, pavement and grass - repeated cues for upright posture and proper foot placement between rear wheels of walker as well as for heelstrike on weight acceptance to increase foot clearance and reduce scuffing/shuffling of feet Curb negotiation - instructions provided for safe approach and negotiation of curb with 4WW/rollator including proper use of brakes during transition up/down   01/09/2024  THERAPEUTIC EXERCISE: To improve strength and endurance.   NuStep - L6 x 6 min - UE/LE to promote reciprocal movement patterns  PHYSICAL PERFORMANCE TEST or MEASUREMENT: Dynamic Gait Index  Level Surface Mild Impairment   Change in Gait Speed Mild Impairment   Gait with Horizontal Head Turns Mild Impairment   Gait with Vertical Head Turns Mild Impairment   Gait and Pivot Turn Mild Impairment   Step Over Obstacle Mild Impairment   Step Around Obstacles Mild Impairment   Steps Moderate Impairment   Total Score 15   DGI comment: Scores of 19 or less are  predictive of falls in older community living adults      GAIT TRAINING: To normalize gait pattern and improve safety with 4WW/rollator.  = 8.75 sec with RW Gait speed = 3.75 ft/sec with RW Gait training within clinic with 4WW/rollator: Focusing on posture and walker proximity for improved directional control while turning and negotiating obstacles.   Discussed benefits and drawbacks of use of 4WW as compared to RW in relation to stability and ease of mobility particularly on uneven surfaces - Will plan to introduce 4 WW with outdoor surfaces within next few visits weather permitting. Provided education on safe use of 4WW/rollator brakes during sit to stand transitions and to control speed of walker during gait.   01/06/2024  THERAPEUTIC EXERCISE: To improve strength and endurance.   NuStep - L6 x 6 min - UE/LE to promote reciprocal movement patterns  NEUROMUSCULAR RE-EDUCATION: To improve coordination, kinesthesia, posture, amplitude of movement, speed of movement to reduce bradykinesia, and reduce rigidity.  Prone PWR! Moves (2 pillows under abdomen/hips to reduce low back strain): Up x 10 Rock x 10 Twist x 10 bil Step x 10   01/02/2024  THERAPEUTIC EXERCISE: To improve strength and endurance.   NuStep - L6 x 6 min - UE/LE to promote reciprocal movement patterns  THERAPEUTIC ACTIVITIES: To improve functional performance.  Demonstration, verbal and tactile cues throughout for technique. Reviewed education on tips to prevent freezing with transfers and gait - simulated approach from foyer into den navigating around furniture to his usual chair where he was experiencing the freezing yesterday.    NEUROMUSCULAR RE-EDUCATION: To improve coordination, kinesthesia, posture, proprioception, amplitude of movement, speed of movement to reduce bradykinesia, and reduce rigidity. Supine PWR! Moves: Up x 10 Rock x 10 Twist x 10 Step x 10    12/30/2023  THERAPEUTIC EXERCISE: To improve  strength and endurance.    Rec Bike - L3 x 6 min  SELF CARE: Provided education to reduce fall risk and to promote safe home environment. Reviewed the Check for Safety - Home Fall Prevention Checklist for Older Adults to help identify fall risk hazards in the home along with strategies to reduce fall risk at home  Reviewed education on tips to prevent freezing with transfers  and gait.    NEUROMUSCULAR RE-EDUCATION: To improve balance, coordination, kinesthesia, posture, proprioception, reduce fall risk, amplitude of movement, speed of movement to reduce bradykinesia, and reduce rigidity. Seated PWR! Moves: Up x 10 Rock x 10 Twist x 10 Step x 10 PWR! Sit to stand x 10 Standing PWR! Moves: (standing in front on mat table with chair in front for intermittent UE support as needed) Up x 10 Rock x 10 Twist x 10 Step x 10    12/26/2023 THERAPEUTIC EXERCISE: To improve strength, endurance, and flexibility.  Demonstration, verbal and tactile cues throughout for technique.  NuStep - L6 x 6 min - UE/LE to promote reciprocal movement patterns Seated hip hinge HS stretch x 30 bil Seated lunge position hip flexor/quad stretch x 30 bil Seated GTB HS curls x 10 bil Seated GTB LAQ x 10 bil  MANUAL THERAPY: To promote normalized muscle tension, improved flexibility, pain modulation, and reduced pain utilizing connective tissue massage, therapeutic massage, and myofascial release. Foam roller to B quads x ~2 min Roller stick to R/L hamstrings x 1-2 min  NEUROMUSCULAR RE-EDUCATION: To improve balance, coordination, kinesthesia, posture, proprioception, reduce fall risk, and amplitude of movement. Standing 4-way GTB SLR with RW support for balance x 10   12/23/2023  THERAPEUTIC EXERCISE: To improve strength and endurance.  Demonstration, verbal and tactile cues throughout for technique.  NuStep - L6 x 6 min Seated GTB B scap retraction + shoulder row x 10  Seated GTB B scap retraction +  shoulder extension x 10  Seated bent over scap retraction + B shoulder extension x 10 with 5# db bilaterally Seated GTB hip ABD/ER clam 2 x 10 Seated GTB hip flexion march x 10  NEUROMUSCULAR RE-EDUCATION: To improve balance, coordination, kinesthesia, posture, proprioception, reduce fall risk, amplitude of movement, speed of movement to reduce bradykinesia, and reduce rigidity. Seated PWR! Moves: Up x 10 Rock x 10 Twist x 10 Step x 10 Standing hip extension with looped GTB at ankles x 10, UE support on RW (encouraged counter support at home) Standing hip abduction with looped GTB at ankles x 10, UE support on RW  THERAPEUTIC ACTIVITIES: To improve functional performance.  Demonstration, verbal and tactile cues throughout for technique. Sit to stand from Airex pad in chair w/o UE assist + GTB hip ABD isometric at distal thighs x 10   12/19/23 Nustep L5x61min UE/LE Seated Lumbar flexion stretch green pball x 30' 3 way Seated hamstring stretch x 30' BLE Seated figure 4 stretch x 30 BLE Seated side bend stretch x 30' B Standing runner stretch x 30' Seated thoracic ext HBH 5x10 Standing pallof press GTB doubled x 10  [employer] pew weight shifts x 10   12/13/23 Nustep level 5 x 6 minutes TUG =  23 seconds, unsteady with turn no device but did touch wall 5XSTS = 23 seconds  really back on heels Berg = 41/56 On airex standing On airex ball toss, had to have another person for CGA tended to lose balance to the back Step over side to sided and forward back PVC T CGA Direction changes   11/19/2023  SELF CARE:  Reviewed eval findings and role of PT in addressing identified deficits as well as need for further assessment of balance.  Provided education on safe hand placement with RW during transfers to reduce fall risk.   PATIENT EDUCATION:  Education details: pillows under knees when sleeping in supine  Person educated: Patient Education method: Explanation, Demonstration, and  Verbal  cues Education comprehension: verbalized understanding  HOME EXERCISE PROGRAM: Access Code: [email or web address] URL: [email or web address] Date: 12/30/2023 Prepared by: Elijah Hidden  Exercises - Supine Lower Trunk Rotation  - 2 x daily - 7 x weekly - 2 sets - 10 reps - 10 sec hold - Seated Thoracic Lumbar Extension with Pectoralis Stretch  - 2 x daily - 7 x weekly - 2 sets - 10 reps - 5 sec hold - Seated Hamstring Stretch  - 2 x daily - 7 x weekly - 3 reps - 30 sec hold - Seated Figure 4 Piriformis Stretch  - 2 x daily - 7 x weekly - 3 reps - 30 sec hold - Standing Gastroc Stretch at Counter  - 2 x daily - 7 x weekly - 3 reps - 30 sec hold - Seated Quadratus Lumborum Stretch in Chair  - 2 x daily - 7 x weekly - 3 reps - 30 sec hold - Seated Flexion Stretch with Swiss Ball  - 2 x daily - 7 x weekly - 3 reps - 30 sec hold - Seated Thoracic Flexion and Rotation with Swiss Ball  - 2 x daily - 7 x weekly - 3 reps - 30 sec hold - Seated Bent Over Shoulder Row with Dumbbells  - 1 x daily - 3 x weekly - 2 sets - 10 reps - 3 sec hold - Seated Shoulder Extension with Dumbbells  - 1 x daily - 3 x weekly - 2 sets - 10 reps - 3 sec hold - Standing Hip Extension with Resistance at Ankles and Counter Support  - 1 x daily - 3 x weekly - 2 sets - 10 reps - 3 sec hold - Standing Hip Abduction with Resistance at Ankles and Counter Support  - 1 x daily - 3 x weekly - 2 sets - 10 reps - 3 sec hold  Patient Education - Tips to reduce freezing episodes with standing or walking - Check for Safety  PWR! Moves: - Sitting - Standing - Supine - Prone   ASSESSMENT:  CLINICAL IMPRESSION: Vincent Walker reports some increased low back pain/discomfort from having to change his sleeping position to supine due to the carbidopa -levodopa  pump, therefore discussed modifications to his sleeping position with pillow(s) under his knees to reduce low back arching/strain.  Continued therapeutic interventions focusing on  increasing hip and knee flexion as well as ankle DF during gait for improved foot clearance and heel strike to reduce shuffling with gait with some carryover noted during gait L>R.  Vincent Walker will benefit from continued skilled PT to address ongoing postural, strength and balance deficits to improve mobility and activity tolerance with decreased pain interference and decreased risk for falls.   EVAL: Vincent Walker is a 78 y.o. male who was referred to physical therapy for evaluation and treatment for Parkinson's disease.  Patient first diagnosed with Parkinson's 5+ years ago.  He has completed 2 prior PT episodes in 2022/2023 and 2024 within the [facility].  Most recent PT episode for Parkinson's disease was 09/25/2022 - 11/20/2022.  Since his last PT episode, he reports worsening of his PD leading to increased gait impairments and need to switch from single-point cane to RW/2WW.  Patient presents with physical impairments of decreased timing and coordination of gait, impaired ambulation, impaired standing balance, abnormal posture, bradykinesia with transfers, impaired activity tolerance, LE weakness, postural instability and decreased safety awareness impacting safe and independent functional mobility.  Examination revealed patient is at risk for falls and  functional decline as evidenced by the following objective test measures: Gait speed 1.41 ft/sec, (2.62 ft/sec is needed for community access and <1.8 ft/sec is indicative of risk for recurrent falls), which demonstrates a significant decline from gait speed of.  He reports increased incidence of freezing of gait, most common in the morning and subsiding as the day progresses.  He has had 2 falls in the past 6 months, both of which occurred while turning with the RW.  Further balance testing with 5xSTS, TUG, Berg and/or DGI/FGA indicated however patient needing to use the bathroom preventing testing today.  ABC scale score of 55% indicates a moderate  level of physical functioning, and score of <69% indicates risk for recurrent falls in PD.  Vincent Walker will benefit from skilled PT to address above deficits to improve mobility and activity tolerance to help reach the maximal level of functional independence with mobility and gait with reduced risk for falls.  Patient and his wife demonstrates understanding of this POC and are in agreement with this plan.   OBJECTIVE IMPAIRMENTS: Abnormal gait, decreased activity tolerance, decreased balance, decreased coordination, decreased knowledge of condition, decreased knowledge of use of DME, decreased mobility, difficulty walking, decreased ROM, decreased strength, decreased safety awareness, increased fascial restrictions, impaired perceived functional ability, impaired flexibility, improper body mechanics, postural dysfunction, and pain.   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   REHAB POTENTIAL: Good  CLINICAL DECISION MAKING: Unstable/unpredictable  EVALUATION COMPLEXITY: High   GOALS: Goals reviewed with patient? Yes  SHORT TERM GOALS: Target date: 01/09/2024  Patient will be independent with initial HEP. Baseline: TBD 12/23/23 - Pt reports no concerns with current HEP exercises Goal status: MET - 01/09/24  2.  Patient will demonstrate decreased fall risk by scoring < 25 sec on TUG. Baseline: TBA Goal status: MET -  12/13/23 - 23 seconds  3.  Patient will be educated on strategies to decrease risk of falls.  Baseline:  Goal status: MET - 12/30/23  4.  Patient will verbalize tips to reduce freezing/festination with gait and turns. Baseline: Patient reports freezing episodes most common in the morning, subsiding as the day progresses. 12/30/23 - reviewed today 01/02/24 - simulated home navigation in places where he is more likely to experiencing freezing Goal status: MET - 01/14/24  LONG TERM GOALS: Target date: 02/20/2024  Patient will be independent with ongoing/advanced HEP for self-management at home incorporating PWR! Moves as indicated .  Baseline:  Goal status: IN PROGRESS - 01/06/24  2.  Patient will be able to ambulate 600' with LRAD with good safety to access community.  Baseline:  01/14/24 - new 4WW/rollator introduced today - cues necessary for safe transfer technique with walker as well as upright posture and proper foot placement between rear wheels of walker with cues for heelstrike on weight acceptance to increase foot clearance Goal status: IN PROGRESS - 01/27/24 - able to walk in excess of 600 feet with 4WW/rollator however limited hip and knee flexion and decreased heel strike observed R>L  3.  Patient will be able  to step up/down curb safely with LRAD for safety with community ambulation.  Baseline:  Goal status: IN PROGRESS - 01/14/24 - instructions provided for safe approach and negotiation of curb with 4WW/rollator including proper use of brakes during transition up/down  4.  Patient will demonstrate gait speed of >/= 1.8 ft/sec (0.55 m/s) to be a safe limited community ambulator with decreased risk for recurrent falls.  Baseline: 1.41 ft/sec with RW Goal status: MET - 01/09/24 - 3.75 ft/sec with RW  5.  Patient will improve 5xSTS time to </= 16 seconds to demonstrate improved functional strength and transfer efficiency. Baseline: 12/13/23 - 23 seconds  Goal status: MET - 01/14/24  - 14.41 sec  6.  Patient will demonstrate at least 19/24 on DGI or 19/30 on FGA to improve gait stability and reduce risk for falls. Baseline: 01/09/24 - DGI = 15/24 Goal status: IN PROGRESS - 01/09/24  7.  Patient will improve Berg score by at least 8 points to improve safety and stability with ADLs in standing and reduce risk for falls.   Baseline: 12/13/23 - 41/56 Goal status: IN PROGRESS - 01/14/24 - 46/56  8.  Patient will report >/= 69% on ABC scale to demonstrate improved balance confidence and decreased risk for falls. Baseline: 880 / 1600 = 55.0 % Goal status: MET - 01/14/24 - 1160 / 1600 = 72.5 %  9. Patient will verbalize understanding of local Parkinson's disease community resources, including community fitness post d/c. Baseline:  Goal status: INITIAL   PLAN:  PT FREQUENCY: 2x/week  PT DURATION: 12 weeks  PLANNED INTERVENTIONS: 97164- PT Re-evaluation, 97750- Physical Performance Testing, 97110-Therapeutic exercises, 97530- Therapeutic activity, [id number]- Neuromuscular re-education, 97535- Self Care, [id number]- Manual therapy, [phone]- Gait training, [phone]- Electrical stimulation (unattended), 97035- Ultrasound, 02966- Ionotophoresis 4mg /ml Dexamethasone , 79439 (1-2 muscles), 20561 (3+ muscles)- Dry Needling, Patient/Family education, Balance training, Stair training, Taping, Joint mobilization, Spinal mobilization, Cryotherapy, and Moist heat  PLAN FOR NEXT SESSION: Review 4WW/rollator safety as indicated including proper gait pattern PRN; LE flexibility; core/postural strengthening; review seated, standing, supine and prone PWR! Moves PRN and re-introduce remaining position (quadruped) as tolerated; review education on tips to reduce freezing as indicated   Elijah CHRISTELLA Hidden, PT 01/30/2024, 2:21 PM

## 2024-02-03 ENCOUNTER — Encounter: Payer: Self-pay | Admitting: Radiology

## 2024-02-04 ENCOUNTER — Encounter: Payer: Self-pay | Admitting: Physical Therapy

## 2024-02-04 ENCOUNTER — Ambulatory Visit: Attending: Neurology | Admitting: Physical Therapy

## 2024-02-04 DIAGNOSIS — R2681 Unsteadiness on feet: Secondary | ICD-10-CM | POA: Diagnosis not present

## 2024-02-04 DIAGNOSIS — R296 Repeated falls: Secondary | ICD-10-CM | POA: Insufficient documentation

## 2024-02-04 DIAGNOSIS — M6281 Muscle weakness (generalized): Secondary | ICD-10-CM | POA: Insufficient documentation

## 2024-02-04 DIAGNOSIS — G20A2 Parkinson's disease without dyskinesia, with fluctuations: Secondary | ICD-10-CM | POA: Insufficient documentation

## 2024-02-04 DIAGNOSIS — R2689 Other abnormalities of gait and mobility: Secondary | ICD-10-CM | POA: Insufficient documentation

## 2024-02-04 NOTE — Therapy (Signed)
 OUTPATIENT PHYSICAL THERAPY PARKINSON'S TREATMENT    Patient Name: Vincent Walker MRN: [id number] DOB:01/10/1946, 78 y.o., male Today's Date: 02/04/2024   END OF SESSION:  PT End of Session - 02/04/24 1536     Visit Number 14    Date for Recertification  02/20/24    Authorization Type Medicare & Federal BCBS    Progress Note Due on Visit 20    PT Start Time 1536    PT Stop Time 1615    PT Time Calculation (min) 39 min    Activity Tolerance Patient tolerated treatment well    Behavior During Therapy [facility] for tasks assessed/performed;Flat affect                  Past Medical History:  Diagnosis Date   Adenomatous polyp of ascending colon 10/09/2018   Allergies 07/07/2018   Arthritis    Astigmatism of both eyes with presbyopia 08/21/2022   Blepharitis of upper and lower eyelids of both eyes 01/10/2016   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 Required nasal packing a couple of days ago.  Had a similar episode of epistaxis a little over a year ago.  At that visit, I could not see the exact bleeding spot. EXAMINATION after packing removal reveals several excoriated areas anteriorly but I was unable to see t   Essential hypertension 12/25/2011   Eustachian tube dysfunction, left 02/21/2021   Flank pain 07/19/2022   History of blood transfusion 2010   After Kidney surgery   History of chicken pox    History of renal cell carcinoma 02/08/2015   diagnosed and removed in 2010 Right Monitored by Dr Tanda Moats of urology at [facility] Dr Claudine Alley, nephrology at [facility]   Hyperglycemia 12/23/2015   Increased thyroid  stimulating hormone (TSH) level 06/07/2017   Ingrown left big  toenail 02/14/2015   Left foot pain 02/14/2015   MVP (mitral valve prolapse) 05/14/2016   Parkinson's disease (HCC) 12/23/2015   Posterior vitreous detachment of both eyes 08/21/2022   Primary open-angle glaucoma, left eye, severe stage 05/05/2021   Primary open-angle glaucoma, right eye, moderate stage 01/10/2016   Pseudophakia, both eyes 08/21/2022   Right hip pain 12/12/2016   Stage 3 chronic kidney disease 12/25/2011   Thrombocytopenia 02/08/2015   Tremor 02/14/2015   Past Surgical History:  Procedure Laterality Date   APPENDECTOMY  1995   CATARACT EXTRACTION, BILATERAL     COLONOSCOPY     COLONOSCOPY WITH PROPOFOL  N/A 12/17/2018   Procedure: COLONOSCOPY WITH PROPOFOL ;  Surgeon: Wilhelmenia Aloha Raddle., MD;  Location: [facility] ENDOSCOPY;  Service: Gastroenterology;  Laterality: N/A;   ENDOSCOPIC MUCOSAL RESECTION N/A 12/17/2018   Procedure: ENDOSCOPIC MUCOSAL RESECTION;  Surgeon: Wilhelmenia Aloha Raddle., MD;  Location: [facility] ENDOSCOPY;  Service: Gastroenterology;  Laterality: N/A;   HEMOSTASIS CLIP PLACEMENT  12/17/2018   Procedure: HEMOSTASIS CLIP PLACEMENT;  Surgeon: Wilhelmenia Aloha Raddle., MD;  Location: [facility] ENDOSCOPY;  Service: Gastroenterology;;   left knee scope  2003   POLYPECTOMY  12/17/2018   Procedure: POLYPECTOMY;  Surgeon: Wilhelmenia Aloha Raddle., MD;  Location: [facility] ENDOSCOPY;  Service: Gastroenterology;;   right knee scope  1999   SUBMUCOSAL LIFTING INJECTION  12/17/2018   Procedure: SUBMUCOSAL LIFTING INJECTION;  Surgeon: Wilhelmenia Aloha Raddle., MD;  Location: [facility] ENDOSCOPY;  Service: Gastroenterology;;   TOE SURGERY Left    metal 2nd toe- and top of foot- straighten bone   TONSILLECTOMY     TOTAL KNEE ARTHROPLASTY Left 07/06/2014   TOTAL NEPHRECTOMY Right    Patient Active Problem List   Diagnosis Date Noted   Cellulitis of left foot 09/17/2023   Dementia due to Parkinson's disease 11/20/2022   Dry eye syndrome of both eyes 08/28/2022   Astigmatism of both eyes with  presbyopia 08/21/2022   Choroidal nevus of right eye 08/21/2022   Posterior vitreous detachment of both eyes 08/21/2022   Pseudophakia, both eyes 08/21/2022   Primary open-angle glaucoma, left eye, severe stage 05/05/2021   Eustachian tube dysfunction, left 02/21/2021   Early dry stage nonexudative age-related macular degeneration of both eyes 03/09/2019   Adenomatous polyp of ascending colon 10/09/2018   Epistaxis 07/07/2018   Allergies 07/07/2018   Obesity 06/15/2018   Increased thyroid  stimulating hormone (TSH) level 06/07/2017   MVP (mitral valve prolapse) 05/14/2016   Blepharitis of upper and lower eyelids of both eyes 01/10/2016   Primary open-angle glaucoma, right eye, moderate stage 01/10/2016   Parkinson's disease (HCC) 12/23/2015   Hyperglycemia 12/23/2015   Tremor 02/14/2015   Dyslipidemia 02/08/2015   History of renal cell carcinoma 02/08/2015   Thrombocytopenia 02/08/2015   History of chicken pox    Essential hypertension 12/25/2011   Stage 3 chronic kidney disease 12/25/2011    PCP: Domenica Harlene LABOR, MD   REFERRING PROVIDER: Evonnie Asberry RAMAN, DO   REFERRING DIAG: G20.A2 (ICD-10-CM) - Parkinson's disease without dyskinesia, with fluctuating manifestations (HCC)   THERAPY DIAG:  Parkinson's disease without dyskinesia, with fluctuations (HCC)  Other abnormalities of gait and mobility  Unsteadiness on feet  Muscle weakness (generalized)  Repeated falls  RATIONALE FOR EVALUATION AND TREATMENT: Rehabilitation  ONSET DATE: PD diagnosis 5+ years ago   NEXT MD VISIT: 04/14/24   SUBJECTIVE:                                                                                                                                                                                                         SUBJECTIVE STATEMENT: Pt reports he had a good weekend.  He now has a vest for the carbidopa -levodopa  pump that helps it feel more secure.  EVAL:  Pt reports worsening of his  Parkinson's disease. His biggest concern is that he is having a harder time walking and had to switch to a 2WW/RW from his cane.  He states his  wife also notes more forgetfulness/memory issues. He recognizes increased freezing of gait, typcially worse in the mornings and lessening as the day progresses.  He is worried about falling and injuring himself to the point where he will need someone to take care of him.  Pt accompanied by: significant other - wife [facility]) in waiting room   PAIN: Are you having pain? 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 Yes. Number of falls 2 (both while using RW to go around a corner)  LIVING ENVIRONMENT: Lives with: lives with their spouse Lives in: House/apartment Stairs: No Has following equipment at home: Single point cane, Environmental Consultant - 2 wheeled, Grab bars, and Recumbent Bike  OCCUPATION: Retired  PLOF: Independent with household mobility with device, Independent with community mobility with device, Needs assistance with homemaking, and Leisure: mostly sedentary recently     PATIENT GOALS: To get myself going to help bring back some of what I've lost.   OBJECTIVE: (objective measures completed at initial evaluation unless otherwise dated)  DIAGNOSTIC FINDINGS:  09/15/23 - CT CERVICAL SPINE WITHOUT CONTRAST (s/p fall, hit right-side) IMPRESSION: 1. No acute cervical spine fracture. 2. Extensive multilevel cervical degenerative changes.  09/15/23 - CT HEAD WITHOUT CONTRAST (s/p fall, hit right-side) IMPRESSION: 1. No acute intracranial process.  09/15/23 - RIGHT SHOULDER - 2+ VIEW (s/p fall, hit right-side) IMPRESSION: 1. No  acute fracture. 2. Bone hypertrophy likely from remote trauma to the distal clavicle. Heterotopic bone formation likely from remote trauma to the coracoclavicular ligament. 3. Mild high-riding humeral head which may be secondary to a superior rotator cuff tear. 4. Mild glenohumeral osteoarthritis.  09/15/23 - LEFT FOOT - COMPLETE 3+ VIEW (L foot pain) IMPRESSION: 1. No significant change from prior.  No acute fracture. 2. Mild hallux valgus. 3. Postsurgical changes of second and third PIP arthrodesis.  09/29/23 - MR FOOT LEFT W WO CONTRAST FINDINGS: Postoperative change with metal artifact to the distal second, third, and fourth digits. No fracture or dislocation. No erosions. Mild second tarsometatarsal joint osteoarthritis with joint space narrowing and mild degenerative edema. The visualized marrow signal is otherwise unremarkable.   No obvious soft tissue wound/ulceration. This may be obscured by metal artifact. Moderate diffuse muscle atrophy. Mild likely reactive myositis. The tendons are unremarkable. Moderate to severe dorsal subcutaneous edema. No organized fluid collection.   IMPRESSION: The exam is slightly limited by metal artifact.   Moderate to severe dorsal subcutaneous edema. Correlate for lymphedema versus cellulitis.   No obvious soft tissue wound/ulceration or evidence of osteomyelitis. If continued clinical concern, CT scan and/or bone scan could be performed to further characterize. This would limit the metal artifact.   Diffuse muscle atrophy.   Mild degenerative change.  COGNITION: Overall cognitive status: Impaired - decreased short-term memory and recall of new information    SENSATION: WFL  COORDINATION: B heel-toe mildly diminished. Impaired heel to shin bilaterally.  MUSCLE TONE: Increased LE muscle tone noted during attempts at PROM/flexibility assessment.   POSTURE:  rounded shoulders, forward head, increased thoracic kyphosis, and flexed  trunk   MUSCLE LENGTH: Hamstrings: mod tight B ITB: mild tight B Piriformis: mod/severe tight B Hip flexors: mod/severe tight B Quads:  mild/mod tight B Heelcord: mild tight B  LOWER EXTREMITY ROM:    Mildly limited B hip and ankle ROM (R>L), mostly due to limited muscle flexibility as above.   LOWER EXTREMITY MMT:    MMT Right eval Left eval R 01/27/24 L 01/27/24  Hip flexion 4+ 4+ 5 5  Hip extension 4- 4 4+ * 4+ *  Hip abduction 4 4 5  * 5 *  Hip adduction 4+ 4+ 5 * 5 *  Hip internal rotation 5 5 5 5   Hip external rotation 4+ 4+ 5 5  Knee flexion 4+ 4+ 5 5  Knee extension 4+ 4+ 5 5  Ankle dorsiflexion 4+ 4+ 5 5  Ankle plantarflexion      Ankle inversion      Ankle eversion      (Blank rows = not tested, * - tested in sitting d/t new carbidopa -levodopa  pump)  BED MOBILITY:  Minimal assist overall  TRANSFERS: Assistive device utilized: Environmental Consultant - 2 wheeled  Sit to stand: Modified independence and SBA - cues to push up from chair rather than pull up on walker Stand to sit: Modified independence Chair to chair: NT Floor: NT  GAIT: Distance walked: Clinic distances Assistive device utilized: Environmental Consultant - 2 wheeled Level of assistance: Modified independence Gait pattern: step through pattern, decreased stride length, shuffling, festinating, and trunk flexed Comments: Patient reports intermittent freezing episodes, most common in the morning and subsiding as the day progresses  FUNCTIONAL TESTS:  5 times sit to stand: 12/13/23 - 23 seconds back on heels Timed up and go (TUG): 12/13/23 - no device 23 seconds lost balance with turn Manual =  Cognitive =  10 meter walk test: 23.25 sec with 2WW Gait Speed: 1.41 ft/sec w/o AD, limited community ambulator; gait speed of <1.8 ft/sec indicates the risk for recurrent falls  Berg Balance Scale: 12/13/23 - 41/56, 37-45 = Significant (>80%) fall risk  Functional gait assessment: TBA Interpretation of FGA scores: Non-Specific Older Adults  Cutoff Score: <=22/30 = risk of falls Parkinson's Disease Cutoff score <15/30= fall risk (Hoehn & Yahr 1-4)  Minimally Clinically Important Difference (MCID)  Stroke (acute, subacute, and chronic) = MDC: 4.2 points Vestibular (acute) = MDC: 6 points Community Dwelling Older Adults =  MCID: 4 points Parkinson's Disease  =  MDC: 4.3 points  (Academy of Neurologic Physical Therapy (nd). Functional Gait Assessment. Retrieved from https://www.neuropt.org/docs/default-source/cpgs/core-outcome-measures/function-gait-assessment-pocket-guide-proof9-(2).pdf?sfvrsn=b41f35043_0.)  Baseline as of D/C from last PT episode (11/20/22): 11/15/22: 5xSTS = 13.88 sec w/o UE assist (occasional hands on knees) TUG:  Normal = 9.93 sec Manual = 10.19 sec Cognitive = 13.16 sec (unable to complete cognitive task) : w/o AD = 10.62 sec, 9.32 sec (fastest comfortable speed w/o AD) with SPC = 11.59 sec with B hiking poles = 11.62 sec Gait speed: 3.09 ft/sec w/o AD  3.52 ft/sec (fastest comfortable speed w/o AD) 2.83 ft/sec with SPC  2.82 ft/sec with B hiking poles    11/20/22: Berg = 50/56, 46-51 moderate risk for falls (>50%)  FGA = 21/30, 19-24 = medium risk for fall   PATIENT SURVEYS:  ABC scale: The Activities-Specific Balance Confidence (ABC) Scale 0% 10 20 30  40 50 60 70 80 90 100% No confidence<->completely confident  "How confident are you that you will not lose your balance or become unsteady when you . . .  Date tested 11/28/23 *confidence with RW 01/14/24 *confidence with RW/4WW  Walk around the house 80% 90%  2. Walk up or down stairs 50% 80%  3. Bend over and pick up a slipper from in front of a closet floor 70% 80%  4. Reach for a small can off a shelf at eye level 100% 100%  5. Stand on tip toes and reach for something above your head 60% 70%  6. Stand on a chair and reach for something 0% 20%  7. Sweep the floor 0% 60%  8. Walk outside the house to a car parked in the driveway 80%  100%  9. Get into or out of a car 90% 100%  10. Walk across a parking lot to the mall 70% 90%  11. Walk up or down a ramp 70% 80%  12. Walk in a crowded mall where people rapidly walk past you 60% 90%  13. Are bumped into by people as you walk through the mall 70% 80%  14. Step onto or off of an escalator while you are holding onto the railing 80% 90%  15. Step onto or off an escalator while holding onto parcels such that you cannot hold onto the railing 0% 30%  16. Walk outside on icy sidewalks 0% 0%  Total: #/16 880 / 1600 = 55.0 % 1160 / 1600 = 72.5 %  50-80% = moderate level of physical functioning <69% indicates risk for recurrent falls in PD   TODAY'S TREATMENT:   02/04/2024  THERAPEUTIC EXERCISE: To improve strength, endurance, and coordination of movement.  Demonstration, verbal and tactile cues throughout for technique.  NuStep - L6 x 6 min - UE/LE to promote reciprocal movement patterns  NEUROMUSCULAR RE-EDUCATION: To improve balance, coordination, kinesthesia, posture, proprioception, reduce fall risk, amplitude of movement, speed of movement to reduce bradykinesia, and reduce rigidity. PWR! Stepping: Lateral step and reach with full weight shift to single LE x 10 B (hands reaching for overhead cabinets) Step-through forward & backward x 10 bil with single UE support on counter Side-stepping with looped RTB at knees 2 x 10 ft bil, with looped RTB at ankles 4 x 10 ft bil, hands trailing lightly along counter Fwd/back monster walk with looped RTB at ankles 4 x 10 ft, 1 hand trailing lightly along counter Tandem gait fwd 6 x 11ft, backwards 4 x 77ft, 1 hand trailing lightly along counter   01/30/2024  THERAPEUTIC EXERCISE: To improve strength, endurance, and flexibility.  Demonstration, verbal and tactile cues throughout for technique.  NuStep - L6 x 6 min - UE/LE to promote reciprocal movement patterns Lumbar extension in standing with forearms on wall x 10  SELF CARE:  Provided education on positioning for pain management.  Demonstrated supine sleeping position with pillow(s) under knees to take strain off back.  NEUROMUSCULAR RE-EDUCATION: To improve kinesthesia, posture, and reduce rigidity. Anatomical position spine roll-up on wall x 10 for postural correction and decreasing low back strain Anatomical position spine roll-up on inside of doorframe x 10 for postural correction and decreasing low back strain, allowing for increased scapular activation/shoulder extension  THERAPEUTIC ACTIVITIES: To improve functional performance.  Demonstration, verbal and tactile cues throughout for technique. Sequential step-over balance pebbles aiming for increased hip and knee flexion and heel strike on weight acceptance Standing toe raises x 10, cues to avoid leaning back at hips Standing heel-toe raises x 10 *UE support for all standing exercises on counter for balance  GAIT TRAINING: To normalize gait pattern and improve safety with 4WW/rollator. 180' with 4WW/rollator focusing on carryover of above stepping pattern to promote increased foot clearance and heel strike on weight acceptance  90'  with 4WW/rollator focusing on high-stepping pattern to promote increased foot clearance and heel strike on weight acceptance  64' with 4WW/rollator focusing on maintaining increased hip and knee flexion pattern to promote increased foot clearance while still achieving heel strike on weight acceptance    01/27/2024  THERAPEUTIC EXERCISE: To improve strength and endurance.    NuStep - L6 x 6 min - UE/LE to promote reciprocal movement patterns  GAIT TRAINING: To normalize gait pattern and improve safety with 4WW/rollator . 600' with 4WW - good walker proximity but cues for increased hip and knee flexion with heel strike on weight acceptance  THERAPEUTIC ACTIVITIES: To improve functional performance.  Demonstration, verbal and tactile cues throughout for technique. Fwd & back step  over 1/2 FR with weight shift x 20 bil to promote increased foot clearance and heel strike during gait Alt toe clears with heel strike on top of 9 stool x 20 Standing heel-toe raises x 10 - limited lift and pt leaning back for toes raises Standing negative toe raises to neutral with heels on 1/2 FR x 10 and off edge of 4 step x 10 Standing negative heel raises to neutral with forefeet on 1/2 FR 2 x 10 *UE support for all standing exercises on wall ladder for balance   01/20/2024  THERAPEUTIC EXERCISE: To improve strength and endurance.    Rec Bike - L3 x 6 min  NEUROMUSCULAR RE-EDUCATION: To improve balance, coordination, kinesthesia, posture, proprioception, reduce fall risk, amplitude of movement, speed of movement to reduce bradykinesia, and reduce rigidity.  Quadruped PWR! Moves: Up x 10 Rock x 10 Twist x 10 - repeated cues for sequencing  Step - unable  PWR! Stepping: Forward x 10 bil with single UE support on counter Forward x 10 bil w/o UE support - 1 LOB requiring PT assist to correct Backward x 10 bil with single UE support on counter Step-through forward & backward x 10 bil with single UE support on counter B side-stepping along counter but no need for UE support 5 x 10' Step, twist and reach x 10 to alternating sides keeping 1 hand on counter   01/14/2024 - 10th visit PN THERAPEUTIC EXERCISE: To improve strength and endurance.   NuStep - L6 x 6 min - UE/LE to promote reciprocal movement patterns  THERAPEUTIC ACTIVITIES: To improve functional performance.  Demonstration, verbal and tactile cues throughout for technique. ABC scale: 1160 / 1600 = 72.5 %, indicates moderate level physical functioning 5xSTS = 14.41 sec w/o UE assist   PHYSICAL PERFORMANCE TEST or MEASUREMENT: Berg Balance Test  Sit to Stand Able to stand without using hands and stabilize independently   Standing Unsupported Able to stand safely 2 minutes   Sitting with Back Unsupported but Feet  Supported on Floor or Stool Able to sit safely and securely 2 minutes   Stand to Sit Sits safely with minimal use of hands   Transfers Able to transfer safely, minor use of hands   Standing Unsupported with Eyes Closed Able to stand 10 seconds safely   Standing Unsupported with Feet Together Able to place feet together independently and stand 1 minute safely   From Standing, Reach Forward with Outstretched Arm Can reach forward >12 cm safely (5)   From Standing Position, Pick up Object from Floor Able to pick up shoe safely and easily   From Standing Position, Turn to Look Behind Over each Shoulder Looks behind one side only/other side shows less weight shift   Turn 360 Degrees  Able to turn 360 degrees safely but slowly   Standing Unsupported, Alternately Place Feet on Step/Stool Able to stand independently and complete 8 steps >20 seconds   Standing Unsupported, One Foot in Front Able to take small step independently and hold 30 seconds   Standing on One Leg Tries to lift leg/unable to hold 3 seconds but remains standing independently   Total Score 46   Berg comment: 46-51 = Moderate (>50%) fall risk       SELF CARE: Provided education to improve safety with use of 4WW/rollator for assistive device and to reduce fall risk.  Patient arrived to PT today with new recently purchased 4WW/rollator today (just set up last night).  Provided cues/instruction in safe use of 4WW/rollator including: Safe transfer technique with walker  Need to lock brakes prior to initiation of transfer  Proper hand placement on seating surface/armrest of chair during both sit to stand and stand to sit transition Upright posture and proper foot placement between rear wheels of walker during gait  GAIT TRAINING: To normalize gait pattern and improve safety with 4WW/rollator.  600' with 4 WW/rollator on level surfaces indoors as well as level and uneven surfaces outdoors including sidewalks, inclines, pavement and grass  - repeated cues for upright posture and proper foot placement between rear wheels of walker as well as for heelstrike on weight acceptance to increase foot clearance and reduce scuffing/shuffling of feet Curb negotiation - instructions provided for safe approach and negotiation of curb with 4WW/rollator including proper use of brakes during transition up/down   01/09/2024  THERAPEUTIC EXERCISE: To improve strength and endurance.   NuStep - L6 x 6 min - UE/LE to promote reciprocal movement patterns  PHYSICAL PERFORMANCE TEST or MEASUREMENT: Dynamic Gait Index  Level Surface Mild Impairment   Change in Gait Speed Mild Impairment   Gait with Horizontal Head Turns Mild Impairment   Gait with Vertical Head Turns Mild Impairment   Gait and Pivot Turn Mild Impairment   Step Over Obstacle Mild Impairment   Step Around Obstacles Mild Impairment   Steps Moderate Impairment   Total Score 15   DGI comment: Scores of 19 or less are predictive of falls in older community living adults      GAIT TRAINING: To normalize gait pattern and improve safety with 4WW/rollator.  = 8.75 sec with RW Gait speed = 3.75 ft/sec with RW Gait training within clinic with 4WW/rollator: Focusing on posture and walker proximity for improved directional control while turning and negotiating obstacles.   Discussed benefits and drawbacks of use of 4WW as compared to RW in relation to stability and ease of mobility particularly on uneven surfaces - Will plan to introduce 4 WW with outdoor surfaces within next few visits weather permitting. Provided education on safe use of 4WW/rollator brakes during sit to stand transitions and to control speed of walker during gait.   01/06/2024  THERAPEUTIC EXERCISE: To improve strength and endurance.   NuStep - L6 x 6 min - UE/LE to promote reciprocal movement patterns  NEUROMUSCULAR RE-EDUCATION: To improve coordination, kinesthesia, posture, amplitude of movement, speed of  movement to reduce bradykinesia, and reduce rigidity.  Prone PWR! Moves (2 pillows under abdomen/hips to reduce low back strain): Up x 10 Rock x 10 Twist x 10 bil Step x 10   01/02/2024  THERAPEUTIC EXERCISE: To improve strength and endurance.   NuStep - L6 x 6 min - UE/LE to promote reciprocal movement patterns  THERAPEUTIC ACTIVITIES: To improve  functional performance.  Demonstration, verbal and tactile cues throughout for technique. Reviewed education on tips to prevent freezing with transfers and gait - simulated approach from foyer into den navigating around furniture to his usual chair where he was experiencing the freezing yesterday.    NEUROMUSCULAR RE-EDUCATION: To improve coordination, kinesthesia, posture, proprioception, amplitude of movement, speed of movement to reduce bradykinesia, and reduce rigidity. Supine PWR! Moves: Up x 10 Rock x 10 Twist x 10 Step x 10    12/30/2023  THERAPEUTIC EXERCISE: To improve strength and endurance.    Rec Bike - L3 x 6 min  SELF CARE: Provided education to reduce fall risk and to promote safe home environment. Reviewed the Check for Safety - Home Fall Prevention Checklist for Older Adults to help identify fall risk hazards in the home along with strategies to reduce fall risk at home  Reviewed education on tips to prevent freezing with transfers and gait.    NEUROMUSCULAR RE-EDUCATION: To improve balance, coordination, kinesthesia, posture, proprioception, reduce fall risk, amplitude of movement, speed of movement to reduce bradykinesia, and reduce rigidity. Seated PWR! Moves: Up x 10 Rock x 10 Twist x 10 Step x 10 PWR! Sit to stand x 10 Standing PWR! Moves: (standing in front on mat table with chair in front for intermittent UE support as needed) Up x 10 Rock x 10 Twist x 10 Step x 10    12/26/2023 THERAPEUTIC EXERCISE: To improve strength, endurance, and flexibility.  Demonstration, verbal and tactile cues throughout for  technique.  NuStep - L6 x 6 min - UE/LE to promote reciprocal movement patterns Seated hip hinge HS stretch x 30 bil Seated lunge position hip flexor/quad stretch x 30 bil Seated GTB HS curls x 10 bil Seated GTB LAQ x 10 bil  MANUAL THERAPY: To promote normalized muscle tension, improved flexibility, pain modulation, and reduced pain utilizing connective tissue massage, therapeutic massage, and myofascial release. Foam roller to B quads x ~2 min Roller stick to R/L hamstrings x 1-2 min  NEUROMUSCULAR RE-EDUCATION: To improve balance, coordination, kinesthesia, posture, proprioception, reduce fall risk, and amplitude of movement. Standing 4-way GTB SLR with RW support for balance x 10   12/23/2023  THERAPEUTIC EXERCISE: To improve strength and endurance.  Demonstration, verbal and tactile cues throughout for technique.  NuStep - L6 x 6 min Seated GTB B scap retraction + shoulder row x 10  Seated GTB B scap retraction + shoulder extension x 10  Seated bent over scap retraction + B shoulder extension x 10 with 5# db bilaterally Seated GTB hip ABD/ER clam 2 x 10 Seated GTB hip flexion march x 10  NEUROMUSCULAR RE-EDUCATION: To improve balance, coordination, kinesthesia, posture, proprioception, reduce fall risk, amplitude of movement, speed of movement to reduce bradykinesia, and reduce rigidity. Seated PWR! Moves: Up x 10 Rock x 10 Twist x 10 Step x 10 Standing hip extension with looped GTB at ankles x 10, UE support on RW (encouraged counter support at home) Standing hip abduction with looped GTB at ankles x 10, UE support on RW  THERAPEUTIC ACTIVITIES: To improve functional performance.  Demonstration, verbal and tactile cues throughout for technique. Sit to stand from Airex pad in chair w/o UE assist + GTB hip ABD isometric at distal thighs x 10   12/19/23 Nustep L5x10min UE/LE Seated Lumbar flexion stretch green pball x 30' 3 way Seated hamstring stretch x 30' BLE Seated  figure 4 stretch x 30 BLE Seated side bend stretch x 30' B  Standing runner stretch x 30' Seated thoracic ext HBH 5x10 Standing pallof press GTB doubled x 10  Church pew weight shifts x 10   12/13/23 Nustep level 5 x 6 minutes TUG =  23 seconds, unsteady with turn no device but did touch wall 5XSTS = 23 seconds  really back on heels Berg = 41/56 On airex standing On airex ball toss, had to have another person for CGA tended to lose balance to the back Step over side to sided and forward back PVC T CGA Direction changes   11/19/2023  SELF CARE:  Reviewed eval findings and role of PT in addressing identified deficits as well as need for further assessment of balance.  Provided education on safe hand placement with RW during transfers to reduce fall risk.   PATIENT EDUCATION:  Education details: pillows under knees when sleeping in supine  Person educated: Patient Education method: Explanation, Demonstration, and Verbal cues Education comprehension: verbalized understanding  HOME EXERCISE PROGRAM: Access Code: [email or web address] URL: [email or web address] Date: 12/30/2023 Prepared by: Elijah Hidden  Exercises - Supine Lower Trunk Rotation  - 2 x daily - 7 x weekly - 2 sets - 10 reps - 10 sec hold - Seated Thoracic Lumbar Extension with Pectoralis Stretch  - 2 x daily - 7 x weekly - 2 sets - 10 reps - 5 sec hold - Seated Hamstring Stretch  - 2 x daily - 7 x weekly - 3 reps - 30 sec hold - Seated Figure 4 Piriformis Stretch  - 2 x daily - 7 x weekly - 3 reps - 30 sec hold - Standing Gastroc Stretch at Counter  - 2 x daily - 7 x weekly - 3 reps - 30 sec hold - Seated Quadratus Lumborum Stretch in Chair  - 2 x daily - 7 x weekly - 3 reps - 30 sec hold - Seated Flexion Stretch with Swiss Ball  - 2 x daily - 7 x weekly - 3 reps - 30 sec hold - Seated Thoracic Flexion and Rotation with Swiss Ball  - 2 x daily - 7 x weekly - 3 reps - 30 sec hold - Seated Bent Over Shoulder Row  with Dumbbells  - 1 x daily - 3 x weekly - 2 sets - 10 reps - 3 sec hold - Seated Shoulder Extension with Dumbbells  - 1 x daily - 3 x weekly - 2 sets - 10 reps - 3 sec hold - Standing Hip Extension with Resistance at Ankles and Counter Support  - 1 x daily - 3 x weekly - 2 sets - 10 reps - 3 sec hold - Standing Hip Abduction with Resistance at Ankles and Counter Support  - 1 x daily - 3 x weekly - 2 sets - 10 reps - 3 sec hold  Patient Education - Tips to reduce freezing episodes with standing or walking - Check for Safety  PWR! Moves: - Sitting - Standing - Supine - Prone   ASSESSMENT:  CLINICAL IMPRESSION: Thien denies need for review of any of his current HEP or PWR! Moves, with prone PWR! Moves deferred due to new carbidopa -levodopa  pump, therefore continued progression of dynamic stepping activities to improve posture, weight shift, reciprocal movement coordination and balance to improve stability and reduce risk for falls.  Patient requiring repeated cues for some activities to achieve desired movement patterns, as well as intermittent need for UE support on counter for balance, but all activities well-tolerated requiring only intermittent seated rest breaks due to  fatigue.  Jonas will benefit from continued skilled PT to address ongoing postural, strength and balance deficits to improve mobility and activity tolerance with decreased pain interference and decreased risk for falls.   EVAL: Vincent Walker is a 78 y.o. male who was referred to physical therapy for evaluation and treatment for Parkinson's disease.  Patient first diagnosed with Parkinson's 5+ years ago.  He has completed 2 prior PT episodes in 2022/2023 and 2024 within the [facility] system.  Most recent PT episode for Parkinson's disease was 09/25/2022 - 11/20/2022.  Since his last PT episode, he reports worsening of his PD leading to increased gait impairments and need to switch from single-point cane to RW/2WW.  Patient  presents with physical impairments of decreased timing and coordination of gait, impaired ambulation, impaired standing balance, abnormal posture, bradykinesia with transfers, impaired activity tolerance, LE weakness, postural instability and decreased safety awareness impacting safe and independent functional mobility.  Examination revealed patient is at risk for falls and functional decline as evidenced by the following objective test measures: Gait speed 1.41 ft/sec, (2.62 ft/sec is needed for community access and <1.8 ft/sec is indicative of risk for recurrent falls), which demonstrates a significant decline from gait speed of.  He reports increased incidence of freezing of gait, most common in the morning and subsiding as the day progresses.  He has had 2 falls in the past 6 months, both of which occurred while turning with the RW.  Further balance testing with 5xSTS, TUG, Berg and/or DGI/FGA indicated however patient needing to use the bathroom preventing testing today.  ABC scale score of 55% indicates a moderate level of physical functioning, and score of <69% indicates risk for recurrent falls in PD.  Chris will benefit from skilled PT to address above deficits to improve mobility and activity tolerance to help reach the maximal level of functional independence with mobility and gait with reduced risk for falls.  Patient and his wife demonstrates understanding of this POC and are in agreement with this plan.   OBJECTIVE IMPAIRMENTS: Abnormal gait, decreased activity tolerance, decreased balance, decreased coordination, decreased knowledge of condition, decreased knowledge of use of DME, decreased mobility, difficulty walking, decreased ROM, decreased strength, decreased safety awareness, increased fascial restrictions, impaired perceived functional ability, impaired flexibility, improper body mechanics, postural dysfunction, and pain.   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   REHAB POTENTIAL: Good  CLINICAL DECISION MAKING: Unstable/unpredictable  EVALUATION COMPLEXITY: High   GOALS: Goals reviewed with patient? Yes  SHORT TERM GOALS: Target date: 01/09/2024  Patient will be independent with initial HEP. Baseline: TBD 12/23/23 - Pt reports no concerns with current HEP exercises Goal status: MET - 01/09/24  2.  Patient will demonstrate decreased fall risk by scoring < 25 sec on TUG. Baseline: TBA Goal status: MET - 12/13/23 - 23 seconds  3.  Patient will be educated on strategies to decrease risk of falls.  Baseline:  Goal status: MET - 12/30/23  4.  Patient will verbalize tips to reduce freezing/festination with gait and  turns. Baseline: Patient reports freezing episodes most common in the morning, subsiding as the day progresses. 12/30/23 - reviewed today 01/02/24 - simulated home navigation in places where he is more likely to experiencing freezing Goal status: MET - 01/14/24  LONG TERM GOALS: Target date: 02/20/2024  Patient will be independent with ongoing/advanced HEP for self-management at home incorporating PWR! Moves as indicated .  Baseline:  Goal status: IN PROGRESS - 01/06/24  2.  Patient will be able to ambulate 600' with LRAD with good safety to access community.  Baseline:  01/14/24 - new 4WW/rollator introduced today - cues necessary for safe transfer technique with walker as  well as upright posture and proper foot placement between rear wheels of walker with cues for heelstrike on weight acceptance to increase foot clearance Goal status: IN PROGRESS - 01/27/24 - able to walk in excess of 600 feet with 4WW/rollator however limited hip and knee flexion and decreased heel strike observed R>L  3.  Patient will be able to step up/down curb safely with LRAD for safety with community ambulation.  Baseline:  Goal status: IN PROGRESS - 01/14/24 - instructions provided for safe approach and negotiation of curb with 4WW/rollator including proper use of brakes during transition up/down  4.  Patient will demonstrate gait speed of >/= 1.8 ft/sec (0.55 m/s) to be a safe limited community ambulator with decreased risk for recurrent falls.  Baseline: 1.41 ft/sec with RW Goal status: MET - 01/09/24 - 3.75 ft/sec with RW  5.  Patient will improve 5xSTS time to </= 16 seconds to demonstrate improved functional strength and transfer efficiency. Baseline: 12/13/23 - 23 seconds  Goal status: MET - 01/14/24 - 14.41 sec  6.  Patient will demonstrate at least 19/24 on DGI or 19/30 on FGA to improve gait stability and reduce risk for falls. Baseline: 01/09/24 - DGI = 15/24 Goal status: IN PROGRESS - 01/09/24  7.  Patient will improve Berg score by at least 8 points to improve safety and stability with ADLs in standing and reduce risk for falls.   Baseline: 12/13/23 - 41/56 Goal status: IN PROGRESS - 01/14/24 - 46/56  8.  Patient will report >/= 69% on ABC scale to demonstrate improved balance confidence and decreased risk for falls. Baseline: 880 / 1600 = 55.0 % Goal status: MET - 01/14/24 - 1160 / 1600 = 72.5 %  9. Patient will verbalize understanding of local Parkinson's disease community resources, including community fitness post d/c. Baseline:  Goal status: INITIAL   PLAN:  PT FREQUENCY: 2x/week  PT DURATION: 12 weeks  PLANNED INTERVENTIONS: 97164- PT Re-evaluation, 97750-  Physical Performance Testing, 97110-Therapeutic exercises, 97530- Therapeutic activity, [id number]- Neuromuscular re-education, 97535- Self Care, [id number]- Manual therapy, [phone]- Gait training, [phone]- Electrical stimulation (unattended), 97035- Ultrasound, 02966- Ionotophoresis 4mg /ml Dexamethasone , 79439 (1-2 muscles), 20561 (3+ muscles)- Dry Needling, Patient/Family education, Balance training, Stair training, Taping, Joint mobilization, Spinal mobilization, Cryotherapy, and Moist heat  PLAN FOR NEXT SESSION: Begin reassessment of standardized balance tests; review 4WW/rollator safety as indicated including proper gait pattern PRN; LE flexibility; core/postural strengthening; review seated, standing and supine PWR! Moves PRN; review education on tips to reduce freezing as indicated   Elijah CHRISTELLA Hidden, PT 02/04/2024, 4:15 PM

## 2024-02-10 ENCOUNTER — Other Ambulatory Visit: Payer: Self-pay | Admitting: Neurology

## 2024-02-10 ENCOUNTER — Other Ambulatory Visit (HOSPITAL_BASED_OUTPATIENT_CLINIC_OR_DEPARTMENT_OTHER): Payer: Self-pay

## 2024-02-11 ENCOUNTER — Encounter: Payer: Self-pay | Admitting: Physical Therapy

## 2024-02-11 ENCOUNTER — Other Ambulatory Visit (HOSPITAL_BASED_OUTPATIENT_CLINIC_OR_DEPARTMENT_OTHER): Payer: Self-pay

## 2024-02-11 ENCOUNTER — Ambulatory Visit: Admitting: Physical Therapy

## 2024-02-11 DIAGNOSIS — R2689 Other abnormalities of gait and mobility: Secondary | ICD-10-CM

## 2024-02-11 DIAGNOSIS — R2681 Unsteadiness on feet: Secondary | ICD-10-CM | POA: Diagnosis not present

## 2024-02-11 DIAGNOSIS — M6281 Muscle weakness (generalized): Secondary | ICD-10-CM | POA: Diagnosis not present

## 2024-02-11 DIAGNOSIS — G20A2 Parkinson's disease without dyskinesia, with fluctuations: Secondary | ICD-10-CM | POA: Diagnosis not present

## 2024-02-11 DIAGNOSIS — R296 Repeated falls: Secondary | ICD-10-CM | POA: Diagnosis not present

## 2024-02-11 MED ORDER — TAMSULOSIN HCL 0.4 MG PO CAPS
0.4000 mg | ORAL_CAPSULE | Freq: Every day | ORAL | 0 refills | Status: DC
  Filled 2024-02-11: qty 30, 30d supply, fill #0

## 2024-02-11 MED ORDER — CLONAZEPAM 0.5 MG PO TABS
0.5000 mg | ORAL_TABLET | Freq: Every day | ORAL | 4 refills | Status: AC
  Filled 2024-02-11: qty 30, 30d supply, fill #0
  Filled 2024-03-11: qty 30, 30d supply, fill #1
  Filled 2024-04-12: qty 30, 30d supply, fill #2

## 2024-02-11 NOTE — Therapy (Signed)
 OUTPATIENT PHYSICAL THERAPY PARKINSON'S TREATMENT    Patient Name: Vincent Walker MRN: [id number] DOB:04/20/1945, 78 y.o., male Today's Date: 02/11/2024   END OF SESSION:  PT End of Session - 02/11/24 1528     Visit Number 15    Date for Recertification  02/20/24    Authorization Type Medicare & Federal BCBS    Progress Note Due on Visit 20    PT Start Time 1528    PT Stop Time 1615    PT Time Calculation (min) 47 min    Activity Tolerance Patient tolerated treatment well    Behavior During Therapy [facility] for tasks assessed/performed;Flat affect                   Past Medical History:  Diagnosis Date   Adenomatous polyp of ascending colon 10/09/2018   Allergies 07/07/2018   Arthritis    Astigmatism of both eyes with presbyopia 08/21/2022   Blepharitis of upper and lower eyelids of both eyes 01/10/2016   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 Required nasal packing a couple of days ago.  Had a similar episode of epistaxis a little over a year ago.  At that visit, I could not see the exact bleeding spot. EXAMINATION after packing removal reveals several excoriated areas anteriorly but I was unable to see t   Essential hypertension 12/25/2011   Eustachian tube dysfunction, left 02/21/2021   Flank pain 07/19/2022   History of blood transfusion 2010   After Kidney surgery   History of chicken pox    History of renal cell carcinoma 02/08/2015   diagnosed and removed in 2010 Right Monitored by Dr Tanda Moats of urology at [facility] Dr Claudine Alley, nephrology at [facility]   Hyperglycemia 12/23/2015   Increased thyroid  stimulating hormone (TSH) level 06/07/2017   Ingrown left  big toenail 02/14/2015   Left foot pain 02/14/2015   MVP (mitral valve prolapse) 05/14/2016   Parkinson's disease (HCC) 12/23/2015   Posterior vitreous detachment of both eyes 08/21/2022   Primary open-angle glaucoma, left eye, severe stage 05/05/2021   Primary open-angle glaucoma, right eye, moderate stage 01/10/2016   Pseudophakia, both eyes 08/21/2022   Right hip pain 12/12/2016   Stage 3 chronic kidney disease 12/25/2011   Thrombocytopenia 02/08/2015   Tremor 02/14/2015   Past Surgical History:  Procedure Laterality Date   APPENDECTOMY  1995   CATARACT EXTRACTION, BILATERAL     COLONOSCOPY     COLONOSCOPY WITH PROPOFOL  N/A 12/17/2018   Procedure: COLONOSCOPY WITH PROPOFOL ;  Surgeon: Wilhelmenia Aloha Raddle., MD;  Location: [facility] ENDOSCOPY;  Service: Gastroenterology;  Laterality: N/A;   ENDOSCOPIC MUCOSAL RESECTION N/A 12/17/2018   Procedure: ENDOSCOPIC MUCOSAL RESECTION;  Surgeon: Wilhelmenia Aloha Raddle., MD;  Location: [facility] ENDOSCOPY;  Service: Gastroenterology;  Laterality: N/A;   HEMOSTASIS CLIP PLACEMENT  12/17/2018   Procedure: HEMOSTASIS CLIP PLACEMENT;  Surgeon: Wilhelmenia Aloha Raddle., MD;  Location: [facility] ENDOSCOPY;  Service: Gastroenterology;;   left knee scope  2003   POLYPECTOMY  12/17/2018   Procedure: POLYPECTOMY;  Surgeon: Wilhelmenia Aloha Raddle., MD;  Location: [facility] ENDOSCOPY;  Service: Gastroenterology;;   right knee scope  1999   SUBMUCOSAL LIFTING INJECTION  12/17/2018   Procedure: SUBMUCOSAL LIFTING  INJECTION;  Surgeon: Wilhelmenia Aloha Raddle., MD;  Location: [facility] ENDOSCOPY;  Service: Gastroenterology;;   TOE SURGERY Left    metal 2nd toe- and top of foot- straighten bone   TONSILLECTOMY     TOTAL KNEE ARTHROPLASTY Left 07/06/2014   TOTAL NEPHRECTOMY Right    Patient Active Problem List   Diagnosis Date Noted   Cellulitis of left foot 09/17/2023   Dementia due to Parkinson's disease 11/20/2022   Dry eye syndrome of both eyes 08/28/2022   Astigmatism of both eyes  with presbyopia 08/21/2022   Choroidal nevus of right eye 08/21/2022   Posterior vitreous detachment of both eyes 08/21/2022   Pseudophakia, both eyes 08/21/2022   Primary open-angle glaucoma, left eye, severe stage 05/05/2021   Eustachian tube dysfunction, left 02/21/2021   Early dry stage nonexudative age-related macular degeneration of both eyes 03/09/2019   Adenomatous polyp of ascending colon 10/09/2018   Epistaxis 07/07/2018   Allergies 07/07/2018   Obesity 06/15/2018   Increased thyroid  stimulating hormone (TSH) level 06/07/2017   MVP (mitral valve prolapse) 05/14/2016   Blepharitis of upper and lower eyelids of both eyes 01/10/2016   Primary open-angle glaucoma, right eye, moderate stage 01/10/2016   Parkinson's disease (HCC) 12/23/2015   Hyperglycemia 12/23/2015   Tremor 02/14/2015   Dyslipidemia 02/08/2015   History of renal cell carcinoma 02/08/2015   Thrombocytopenia 02/08/2015   History of chicken pox    Essential hypertension 12/25/2011   Stage 3 chronic kidney disease 12/25/2011    PCP: Domenica Harlene LABOR, MD   REFERRING PROVIDER: Evonnie Asberry RAMAN, DO   REFERRING DIAG: G20.A2 (ICD-10-CM) - Parkinson's disease without dyskinesia, with fluctuating manifestations (HCC)   THERAPY DIAG:  Parkinson's disease without dyskinesia, with fluctuations (HCC)  Other abnormalities of gait and mobility  Unsteadiness on feet  Muscle weakness (generalized)  Repeated falls  RATIONALE FOR EVALUATION AND TREATMENT: Rehabilitation  ONSET DATE: PD diagnosis 5+ years ago   NEXT MD VISIT: 04/14/24   SUBJECTIVE:                                                                                                                                                                                                         SUBJECTIVE STATEMENT: Pt reports he had a good weekend.  He now has a vest for the carbidopa -levodopa  pump that helps it feel more secure.  EVAL:  Pt reports worsening of  his Parkinson's disease. His biggest concern is that he is having a harder time walking and had to switch to a 2WW/RW from his cane.  He  states his wife also notes more forgetfulness/memory issues. He recognizes increased freezing of gait, typcially worse in the mornings and lessening as the day progresses.  He is worried about falling and injuring himself to the point where he will need someone to take care of him.  Pt accompanied by: significant other - wife [facility]) in waiting room   PAIN: Are you having pain? 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 Yes. Number of falls 2 (both while using RW to go around a corner)  LIVING ENVIRONMENT: Lives with: lives with their spouse Lives in: House/apartment Stairs: No Has following equipment at home: Single point cane, Environmental Consultant - 2 wheeled, Grab bars, and Recumbent Bike  OCCUPATION: Retired  PLOF: Independent with household mobility with device, Independent with community mobility with device, Needs assistance with homemaking, and Leisure: mostly sedentary recently     PATIENT GOALS: To get myself going to help bring back some of what I've lost.   OBJECTIVE: (objective measures completed at initial evaluation unless otherwise dated)  DIAGNOSTIC FINDINGS:  09/15/23 - CT CERVICAL SPINE WITHOUT CONTRAST (s/p fall, hit right-side) IMPRESSION: 1. No acute cervical spine fracture. 2. Extensive multilevel cervical degenerative changes.  09/15/23 - CT HEAD WITHOUT CONTRAST (s/p fall, hit right-side) IMPRESSION: 1. No acute intracranial process.  09/15/23 - RIGHT SHOULDER - 2+ VIEW (s/p fall, hit right-side) IMPRESSION: 1.  No acute fracture. 2. Bone hypertrophy likely from remote trauma to the distal clavicle. Heterotopic bone formation likely from remote trauma to the coracoclavicular ligament. 3. Mild high-riding humeral head which may be secondary to a superior rotator cuff tear. 4. Mild glenohumeral osteoarthritis.  09/15/23 - LEFT FOOT - COMPLETE 3+ VIEW (L foot pain) IMPRESSION: 1. No significant change from prior.  No acute fracture. 2. Mild hallux valgus. 3. Postsurgical changes of second and third PIP arthrodesis.  09/29/23 - MR FOOT LEFT W WO CONTRAST FINDINGS: Postoperative change with metal artifact to the distal second, third, and fourth digits. No fracture or dislocation. No erosions. Mild second tarsometatarsal joint osteoarthritis with joint space narrowing and mild degenerative edema. The visualized marrow signal is otherwise unremarkable.   No obvious soft tissue wound/ulceration. This may be obscured by metal artifact. Moderate diffuse muscle atrophy. Mild likely reactive myositis. The tendons are unremarkable. Moderate to severe dorsal subcutaneous edema. No organized fluid collection.   IMPRESSION: The exam is slightly limited by metal artifact.   Moderate to severe dorsal subcutaneous edema. Correlate for lymphedema versus cellulitis.   No obvious soft tissue wound/ulceration or evidence of osteomyelitis. If continued clinical concern, CT scan and/or bone scan could be performed to further characterize. This would limit the metal artifact.   Diffuse muscle atrophy.   Mild degenerative change.  COGNITION: Overall cognitive status: Impaired - decreased short-term memory and recall of new information    SENSATION: WFL  COORDINATION: B heel-toe mildly diminished. Impaired heel to shin bilaterally.  MUSCLE TONE: Increased LE muscle tone noted during attempts at PROM/flexibility assessment.   POSTURE:  rounded shoulders, forward head, increased thoracic kyphosis, and  flexed trunk   MUSCLE LENGTH: Hamstrings: mod tight B ITB: mild tight B Piriformis: mod/severe tight B Hip flexors: mod/severe tight  B Quads: mild/mod tight B Heelcord: mild tight B  LOWER EXTREMITY ROM:    Mildly limited B hip and ankle ROM (R>L), mostly due to limited muscle flexibility as above.   LOWER EXTREMITY MMT:    MMT Right eval Left eval R 01/27/24 L 01/27/24  Hip flexion 4+ 4+ 5 5  Hip extension 4- 4 4+ * 4+ *  Hip abduction 4 4 5  * 5 *  Hip adduction 4+ 4+ 5 * 5 *  Hip internal rotation 5 5 5 5   Hip external rotation 4+ 4+ 5 5  Knee flexion 4+ 4+ 5 5  Knee extension 4+ 4+ 5 5  Ankle dorsiflexion 4+ 4+ 5 5  Ankle plantarflexion      Ankle inversion      Ankle eversion      (Blank rows = not tested, * - tested in sitting d/t new carbidopa -levodopa  pump)  BED MOBILITY:  Minimal assist overall  TRANSFERS: Assistive device utilized: Environmental Consultant - 2 wheeled  Sit to stand: Modified independence and SBA - cues to push up from chair rather than pull up on walker Stand to sit: Modified independence Chair to chair: NT Floor: NT  GAIT: Distance walked: Clinic distances Assistive device utilized: Environmental Consultant - 2 wheeled Level of assistance: Modified independence Gait pattern: step through pattern, decreased stride length, shuffling, festinating, and trunk flexed Comments: Patient reports intermittent freezing episodes, most common in the morning and subsiding as the day progresses  FUNCTIONAL TESTS:  5 times sit to stand: 12/13/23 - 23 seconds back on heels Timed up and go (TUG): 12/13/23 - no device 23 seconds lost balance with turn Manual =  Cognitive =  10 meter walk test: 23.25 sec with 2WW Gait Speed: 1.41 ft/sec with 2WW, limited community ambulator; gait speed of <1.8 ft/sec indicates the risk for recurrent falls  Berg Balance Scale: 12/13/23 - 41/56, 37-45 = Significant (>80%) fall risk  Functional gait assessment: TBA Interpretation of FGA scores: Non-Specific  Older Adults Cutoff Score: <=22/30 = risk of falls Parkinson's Disease Cutoff score <15/30= fall risk (Hoehn & Yahr 1-4)  Minimally Clinically Important Difference (MCID)  Stroke (acute, subacute, and chronic) = MDC: 4.2 points Vestibular (acute) = MDC: 6 points Community Dwelling Older Adults =  MCID: 4 points Parkinson's Disease  =  MDC: 4.3 points  (Academy of Neurologic Physical Therapy (nd). Functional Gait Assessment. Retrieved from https://www.neuropt.org/docs/default-source/cpgs/core-outcome-measures/function-gait-assessment-pocket-guide-proof9-(2).pdf?sfvrsn=b87f35043_0.)  Baseline as of D/C from last PT episode (11/20/22): 11/15/22: 5xSTS = 13.88 sec w/o UE assist (occasional hands on knees) TUG:  Normal = 9.93 sec Manual = 10.19 sec Cognitive = 13.16 sec (unable to complete cognitive task) : w/o AD = 10.62 sec, 9.32 sec (fastest comfortable speed w/o AD) with SPC = 11.59 sec with B hiking poles = 11.62 sec Gait speed: 3.09 ft/sec w/o AD  3.52 ft/sec (fastest comfortable speed w/o AD) 2.83 ft/sec with SPC  2.82 ft/sec with B hiking poles    11/20/22: Berg = 50/56, 46-51 moderate risk for falls (>50%)  FGA = 21/30, 19-24 = medium risk for fall   PATIENT SURVEYS:  ABC scale: The Activities-Specific Balance Confidence (ABC) Scale 0% 10 20 30  40 50 60 70 80 90 100% No confidence<->completely confident  "How confident are you that you will not lose your balance or become unsteady when you . . .  Date tested 11/28/23 *confidence with RW 01/14/24 *confidence with RW/4WW  Walk around the house 80% 90%  2. Walk up or down stairs 50%  80%  3. Bend over and pick up a slipper from in front of a closet floor 70% 80%  4. Reach for a small can off a shelf at eye level 100% 100%  5. Stand on tip toes and reach for something above your head 60% 70%  6. Stand on a chair and reach for something 0% 20%  7. Sweep the floor 0% 60%  8. Walk outside the house to a car parked in the  driveway 80% 100%  9. Get into or out of a car 90% 100%  10. Walk across a parking lot to the mall 70% 90%  11. Walk up or down a ramp 70% 80%  12. Walk in a crowded mall where people rapidly walk past you 60% 90%  13. Are bumped into by people as you walk through the mall 70% 80%  14. Step onto or off of an escalator while you are holding onto the railing 80% 90%  15. Step onto or off an escalator while holding onto parcels such that you cannot hold onto the railing 0% 30%  16. Walk outside on icy sidewalks 0% 0%  Total: #/16 880 / 1600 = 55.0 % 1160 / 1600 = 72.5 %  50-80% = moderate level of physical functioning <69% indicates risk for recurrent falls in PD   TODAY'S TREATMENT:   02/11/2024  THERAPEUTIC EXERCISE: To improve strength and endurance.    NuStep - L6 x 6 min - UE/LE to promote reciprocal movement patterns  THERAPEUTIC ACTIVITIES: To improve functional performance.  Demonstration, verbal and tactile cues throughout for technique. = 8.90 sec with 4WW Gait speed = 3.69 ft/sec with 4WW Step-over and back over 1/2 FR targeting increased hip and knee flexion with heel strike on weight acceptance with heel-toe rock, then reversing weight shift to step back Sequential step-over 1/2 FR and balance pebbles (4 sequential obstacles) aiming for increased hip and knee flexion and heel strike on weight acceptance Lateral sequential step-over 1/2 FR and balance pebbles (4 sequential obstacles) aiming for increased hip and knee flexion and increased lateral motion at hips  PHYSICAL PERFORMANCE TEST or MEASUREMENT: Dynamic Gait Index  Level Surface Normal   Change in Gait Speed Normal   Gait with Horizontal Head Turns Mild Impairment   Gait with Vertical Head Turns Mild Impairment   Gait and Pivot Turn Mild Impairment   Step Over Obstacle Mild Impairment   Step Around Obstacles Normal   Steps Mild Impairment   Total Score 19   DGI comment: 19-22/24 indicates medium fall risk       SELF CARE: Provided education to prevent loss of gains achieved with physical therapy and to prevent future decline in function.  Provided education on community resources related to Parkinson's including support group and educational sessions, community-based PWR! Moves and exercise/movement groups as well as online resources related to PD.     02/04/2024  THERAPEUTIC EXERCISE: To improve strength, endurance, and coordination of movement.  Demonstration, verbal and tactile cues throughout for technique.  NuStep - L6 x 6 min - UE/LE to promote reciprocal movement patterns  NEUROMUSCULAR RE-EDUCATION: To improve balance, coordination, kinesthesia, posture, proprioception, reduce fall risk, amplitude of movement, speed of movement to reduce bradykinesia, and reduce rigidity. PWR! Stepping: Lateral step and reach with full weight shift to single LE x 10 B (hands reaching for overhead cabinets) Step-through forward & backward x 10 bil with single UE support on counter Side-stepping with looped RTB at knees 2  x 10 ft bil, with looped RTB at ankles 4 x 10 ft bil, hands trailing lightly along counter Fwd/back monster walk with looped RTB at ankles 4 x 10 ft, 1 hand trailing lightly along counter Tandem gait fwd 6 x 13ft, backwards 4 x 18ft, 1 hand trailing lightly along counter   01/30/2024  THERAPEUTIC EXERCISE: To improve strength, endurance, and flexibility.  Demonstration, verbal and tactile cues throughout for technique.  NuStep - L6 x 6 min - UE/LE to promote reciprocal movement patterns Lumbar extension in standing with forearms on wall x 10  SELF CARE: Provided education on positioning for pain management.  Demonstrated supine sleeping position with pillow(s) under knees to take strain off back.  NEUROMUSCULAR RE-EDUCATION: To improve kinesthesia, posture, and reduce rigidity. Anatomical position spine roll-up on wall x 10 for postural correction and decreasing low back  strain Anatomical position spine roll-up on inside of doorframe x 10 for postural correction and decreasing low back strain, allowing for increased scapular activation/shoulder extension  THERAPEUTIC ACTIVITIES: To improve functional performance.  Demonstration, verbal and tactile cues throughout for technique. Sequential step-over balance pebbles aiming for increased hip and knee flexion and heel strike on weight acceptance Standing toe raises x 10, cues to avoid leaning back at hips Standing heel-toe raises x 10 *UE support for all standing exercises on counter for balance  GAIT TRAINING: To normalize gait pattern and improve safety with 4WW/rollator. 180' with 4WW/rollator focusing on carryover of above stepping pattern to promote increased foot clearance and heel strike on weight acceptance  3' with 4WW/rollator focusing on high-stepping pattern to promote increased foot clearance and heel strike on weight acceptance  94' with 4WW/rollator focusing on maintaining increased hip and knee flexion pattern to promote increased foot clearance while still achieving heel strike on weight acceptance    01/27/2024  THERAPEUTIC EXERCISE: To improve strength and endurance.    NuStep - L6 x 6 min - UE/LE to promote reciprocal movement patterns  GAIT TRAINING: To normalize gait pattern and improve safety with 4WW/rollator . 600' with 4WW - good walker proximity but cues for increased hip and knee flexion with heel strike on weight acceptance  THERAPEUTIC ACTIVITIES: To improve functional performance.  Demonstration, verbal and tactile cues throughout for technique. Fwd & back step over 1/2 FR with weight shift x 20 bil to promote increased foot clearance and heel strike during gait Alt toe clears with heel strike on top of 9 stool x 20 Standing heel-toe raises x 10 - limited lift and pt leaning back for toes raises Standing negative toe raises to neutral with heels on 1/2 FR x 10 and off edge of 4  step x 10 Standing negative heel raises to neutral with forefeet on 1/2 FR 2 x 10 *UE support for all standing exercises on wall ladder for balance   01/20/2024  THERAPEUTIC EXERCISE: To improve strength and endurance.    Rec Bike - L3 x 6 min  NEUROMUSCULAR RE-EDUCATION: To improve balance, coordination, kinesthesia, posture, proprioception, reduce fall risk, amplitude of movement, speed of movement to reduce bradykinesia, and reduce rigidity.  Quadruped PWR! Moves: Up x 10 Rock x 10 Twist x 10 - repeated cues for sequencing  Step - unable  PWR! Stepping: Forward x 10 bil with single UE support on counter Forward x 10 bil w/o UE support - 1 LOB requiring PT assist to correct Backward x 10 bil with single UE support on counter Step-through forward & backward x 10 bil  with single UE support on counter B side-stepping along counter but no need for UE support 5 x 10' Step, twist and reach x 10 to alternating sides keeping 1 hand on counter   01/14/2024 - 10th visit PN THERAPEUTIC EXERCISE: To improve strength and endurance.   NuStep - L6 x 6 min - UE/LE to promote reciprocal movement patterns  THERAPEUTIC ACTIVITIES: To improve functional performance.  Demonstration, verbal and tactile cues throughout for technique. ABC scale: 1160 / 1600 = 72.5 %, indicates moderate level physical functioning 5xSTS = 14.41 sec w/o UE assist   PHYSICAL PERFORMANCE TEST or MEASUREMENT: Berg Balance Test  Sit to Stand Able to stand without using hands and stabilize independently   Standing Unsupported Able to stand safely 2 minutes   Sitting with Back Unsupported but Feet Supported on Floor or Stool Able to sit safely and securely 2 minutes   Stand to Sit Sits safely with minimal use of hands   Transfers Able to transfer safely, minor use of hands   Standing Unsupported with Eyes Closed Able to stand 10 seconds safely   Standing Unsupported with Feet Together Able to place feet together  independently and stand 1 minute safely   From Standing, Reach Forward with Outstretched Arm Can reach forward >12 cm safely (5)   From Standing Position, Pick up Object from Floor Able to pick up shoe safely and easily   From Standing Position, Turn to Look Behind Over each Shoulder Looks behind one side only/other side shows less weight shift   Turn 360 Degrees Able to turn 360 degrees safely but slowly   Standing Unsupported, Alternately Place Feet on Step/Stool Able to stand independently and complete 8 steps >20 seconds   Standing Unsupported, One Foot in Front Able to take small step independently and hold 30 seconds   Standing on One Leg Tries to lift leg/unable to hold 3 seconds but remains standing independently   Total Score 46   Berg comment: 46-51 = Moderate (>50%) fall risk       SELF CARE: Provided education to improve safety with use of 4WW/rollator for assistive device and to reduce fall risk.  Patient arrived to PT today with new recently purchased 4WW/rollator today (just set up last night).  Provided cues/instruction in safe use of 4WW/rollator including: Safe transfer technique with walker  Need to lock brakes prior to initiation of transfer  Proper hand placement on seating surface/armrest of chair during both sit to stand and stand to sit transition Upright posture and proper foot placement between rear wheels of walker during gait  GAIT TRAINING: To normalize gait pattern and improve safety with 4WW/rollator.  600' with 4 WW/rollator on level surfaces indoors as well as level and uneven surfaces outdoors including sidewalks, inclines, pavement and grass - repeated cues for upright posture and proper foot placement between rear wheels of walker as well as for heelstrike on weight acceptance to increase foot clearance and reduce scuffing/shuffling of feet Curb negotiation - instructions provided for safe approach and negotiation of curb with 4WW/rollator including proper  use of brakes during transition up/down   01/09/2024  THERAPEUTIC EXERCISE: To improve strength and endurance.   NuStep - L6 x 6 min - UE/LE to promote reciprocal movement patterns  PHYSICAL PERFORMANCE TEST or MEASUREMENT: Dynamic Gait Index  Level Surface Mild Impairment   Change in Gait Speed Mild Impairment   Gait with Horizontal Head Turns Mild Impairment   Gait with Vertical Head Turns Mild Impairment  Gait and Pivot Turn Mild Impairment   Step Over Obstacle Mild Impairment   Step Around Obstacles Mild Impairment   Steps Moderate Impairment   Total Score 15   DGI comment: Scores of 19 or less are predictive of falls in older community living adults      GAIT TRAINING: To normalize gait pattern and improve safety with 4WW/rollator.  = 8.75 sec with RW Gait speed = 3.75 ft/sec with RW Gait training within clinic with 4WW/rollator: Focusing on posture and walker proximity for improved directional control while turning and negotiating obstacles.   Discussed benefits and drawbacks of use of 4WW as compared to RW in relation to stability and ease of mobility particularly on uneven surfaces - Will plan to introduce 4 WW with outdoor surfaces within next few visits weather permitting. Provided education on safe use of 4WW/rollator brakes during sit to stand transitions and to control speed of walker during gait.   01/06/2024  THERAPEUTIC EXERCISE: To improve strength and endurance.   NuStep - L6 x 6 min - UE/LE to promote reciprocal movement patterns  NEUROMUSCULAR RE-EDUCATION: To improve coordination, kinesthesia, posture, amplitude of movement, speed of movement to reduce bradykinesia, and reduce rigidity.  Prone PWR! Moves (2 pillows under abdomen/hips to reduce low back strain): Up x 10 Rock x 10 Twist x 10 bil Step x 10   01/02/2024  THERAPEUTIC EXERCISE: To improve strength and endurance.   NuStep - L6 x 6 min - UE/LE to promote reciprocal movement  patterns  THERAPEUTIC ACTIVITIES: To improve functional performance.  Demonstration, verbal and tactile cues throughout for technique. Reviewed education on tips to prevent freezing with transfers and gait - simulated approach from foyer into den navigating around furniture to his usual chair where he was experiencing the freezing yesterday.    NEUROMUSCULAR RE-EDUCATION: To improve coordination, kinesthesia, posture, proprioception, amplitude of movement, speed of movement to reduce bradykinesia, and reduce rigidity. Supine PWR! Moves: Up x 10 Rock x 10 Twist x 10 Step x 10    12/30/2023  THERAPEUTIC EXERCISE: To improve strength and endurance.    Rec Bike - L3 x 6 min  SELF CARE: Provided education to reduce fall risk and to promote safe home environment. Reviewed the Check for Safety - Home Fall Prevention Checklist for Older Adults to help identify fall risk hazards in the home along with strategies to reduce fall risk at home  Reviewed education on tips to prevent freezing with transfers and gait.    NEUROMUSCULAR RE-EDUCATION: To improve balance, coordination, kinesthesia, posture, proprioception, reduce fall risk, amplitude of movement, speed of movement to reduce bradykinesia, and reduce rigidity. Seated PWR! Moves: Up x 10 Rock x 10 Twist x 10 Step x 10 PWR! Sit to stand x 10 Standing PWR! Moves: (standing in front on mat table with chair in front for intermittent UE support as needed) Up x 10 Rock x 10 Twist x 10 Step x 10    12/26/2023 THERAPEUTIC EXERCISE: To improve strength, endurance, and flexibility.  Demonstration, verbal and tactile cues throughout for technique.  NuStep - L6 x 6 min - UE/LE to promote reciprocal movement patterns Seated hip hinge HS stretch x 30 bil Seated lunge position hip flexor/quad stretch x 30 bil Seated GTB HS curls x 10 bil Seated GTB LAQ x 10 bil  MANUAL THERAPY: To promote normalized muscle tension, improved flexibility, pain  modulation, and reduced pain utilizing connective tissue massage, therapeutic massage, and myofascial release. Foam roller to B  quads x ~2 min Roller stick to R/L hamstrings x 1-2 min  NEUROMUSCULAR RE-EDUCATION: To improve balance, coordination, kinesthesia, posture, proprioception, reduce fall risk, and amplitude of movement. Standing 4-way GTB SLR with RW support for balance x 10   12/23/2023  THERAPEUTIC EXERCISE: To improve strength and endurance.  Demonstration, verbal and tactile cues throughout for technique.  NuStep - L6 x 6 min Seated GTB B scap retraction + shoulder row x 10  Seated GTB B scap retraction + shoulder extension x 10  Seated bent over scap retraction + B shoulder extension x 10 with 5# db bilaterally Seated GTB hip ABD/ER clam 2 x 10 Seated GTB hip flexion march x 10  NEUROMUSCULAR RE-EDUCATION: To improve balance, coordination, kinesthesia, posture, proprioception, reduce fall risk, amplitude of movement, speed of movement to reduce bradykinesia, and reduce rigidity. Seated PWR! Moves: Up x 10 Rock x 10 Twist x 10 Step x 10 Standing hip extension with looped GTB at ankles x 10, UE support on RW (encouraged counter support at home) Standing hip abduction with looped GTB at ankles x 10, UE support on RW  THERAPEUTIC ACTIVITIES: To improve functional performance.  Demonstration, verbal and tactile cues throughout for technique. Sit to stand from Airex pad in chair w/o UE assist + GTB hip ABD isometric at distal thighs x 10   12/19/23 Nustep L5x75min UE/LE Seated Lumbar flexion stretch green pball x 30' 3 way Seated hamstring stretch x 30' BLE Seated figure 4 stretch x 30 BLE Seated side bend stretch x 30' B Standing runner stretch x 30' Seated thoracic ext HBH 5x10 Standing pallof press GTB doubled x 10  [employer] pew weight shifts x 10   12/13/23 Nustep level 5 x 6 minutes TUG =  23 seconds, unsteady with turn no device but did touch wall 5XSTS = 23  seconds  really back on heels Berg = 41/56 On airex standing On airex ball toss, had to have another person for CGA tended to lose balance to the back Step over side to sided and forward back PVC T CGA Direction changes   11/19/2023  SELF CARE:  Reviewed eval findings and role of PT in addressing identified deficits as well as need for further assessment of balance.  Provided education on safe hand placement with RW during transfers to reduce fall risk.   PATIENT EDUCATION:  Education details: reviewed information and provided handout(s) on community focused exercise programs for pt's with PD or movement disorders to prevent loss of gains achieved with PT and slow progression of PD related deficits  Person educated: Patient Education method: Explanation, Verbal cues, and Handouts Education comprehension: verbalized understanding  HOME EXERCISE PROGRAM: Access Code: [email or web address] URL: [email or web address] Date: 12/30/2023 Prepared by: Elijah Hidden  Exercises - Supine Lower Trunk Rotation  - 2 x daily - 7 x weekly - 2 sets - 10 reps - 10 sec hold - Seated Thoracic Lumbar Extension with Pectoralis Stretch  - 2 x daily - 7 x weekly - 2 sets - 10 reps - 5 sec hold - Seated Hamstring Stretch  - 2 x daily - 7 x weekly - 3 reps - 30 sec hold - Seated Figure 4 Piriformis Stretch  - 2 x daily - 7 x weekly - 3 reps - 30 sec hold - Standing Gastroc Stretch at Counter  - 2 x daily - 7 x weekly - 3 reps - 30 sec hold - Seated Quadratus Lumborum Stretch in Chair  - 2 x daily -  7 x weekly - 3 reps - 30 sec hold - Seated Flexion Stretch with Swiss Ball  - 2 x daily - 7 x weekly - 3 reps - 30 sec hold - Seated Thoracic Flexion and Rotation with Swiss Ball  - 2 x daily - 7 x weekly - 3 reps - 30 sec hold - Seated Bent Over Shoulder Row with Dumbbells  - 1 x daily - 3 x weekly - 2 sets - 10 reps - 3 sec hold - Seated Shoulder Extension with Dumbbells  - 1 x daily - 3 x weekly - 2 sets - 10  reps - 3 sec hold - Standing Hip Extension with Resistance at Ankles and Counter Support  - 1 x daily - 3 x weekly - 2 sets - 10 reps - 3 sec hold - Standing Hip Abduction with Resistance at Ankles and Counter Support  - 1 x daily - 3 x weekly - 2 sets - 10 reps - 3 sec hold  Patient Education - Tips to reduce freezing episodes with standing or walking - Check for Safety  PWR! Moves: - Sitting - Standing - Supine - Prone   ASSESSMENT:  CLINICAL IMPRESSION: DGI assessment revealing 4 point gain with current score of 19/24 indicating improvement from high fall risk to medium fall risk.  Gait speed remains significantly improved from baseline testing at 3.69 ft/sec today with 4WW, although patient still noted to demonstrate shuffling gait with decreased foot clearance during testing.  Continued to reinforce increased hip and knee flexion and heel strike on weight acceptance with heel toe progression on forward weight during stance phase of gait to promote improved foot clearance and decrease risk for falls related to stumbling.  Sabri will benefit from continued skilled PT to address ongoing postural, strength and balance deficits to improve mobility and activity tolerance with decreased pain interference and decreased risk for falls.  We reviewed community resources related to Parkinson's including support group and educational sessions, community-based PWR! Moves and exercise/movement groups as well as online resources related to PD, with current handouts provided, however patient reporting that he has not traditionally participated with these activities in the past due to schedule commitments in providing transportation for his grandchildren.    EVAL: Vincent Walker is a 78 y.o. male who was referred to physical therapy for evaluation and treatment for Parkinson's disease.  Patient first diagnosed with Parkinson's 5+ years ago.  He has completed 2 prior PT episodes in 2022/2023 and 2024 within the  [facility].  Most recent PT episode for Parkinson's disease was 09/25/2022 - 11/20/2022.  Since his last PT episode, he reports worsening of his PD leading to increased gait impairments and need to switch from single-point cane to RW/2WW.  Patient presents with physical impairments of decreased timing and coordination of gait, impaired ambulation, impaired standing balance, abnormal posture, bradykinesia with transfers, impaired activity tolerance, LE weakness, postural instability and decreased safety awareness impacting safe and independent functional mobility.  Examination revealed patient is at risk for falls and functional decline as evidenced by the following objective test measures: Gait speed 1.41 ft/sec, (2.62 ft/sec is needed for community access and <1.8 ft/sec is indicative of risk for recurrent falls), which demonstrates a significant decline from gait speed of.  He reports increased incidence of freezing of gait, most common in the morning and subsiding as the day progresses.  He has had 2 falls in the past 6 months, both of which occurred while  turning with the RW.  Further balance testing with 5xSTS, TUG, Berg and/or DGI/FGA indicated however patient needing to use the bathroom preventing testing today.  ABC scale score of 55% indicates a moderate level of physical functioning, and score of <69% indicates risk for recurrent falls in PD.  Riyaan will benefit from skilled PT to address above deficits to improve mobility and activity tolerance to help reach the maximal level of functional independence with mobility and gait with reduced risk for falls.  Patient and his wife demonstrates understanding of this POC and are in agreement with this plan.   OBJECTIVE IMPAIRMENTS: Abnormal gait, decreased activity tolerance, decreased balance, decreased coordination, decreased knowledge of condition, decreased knowledge of use of DME, decreased mobility, difficulty walking, decreased ROM, decreased  strength, decreased safety awareness, increased fascial restrictions, impaired perceived functional ability, impaired flexibility, improper body mechanics, postural dysfunction, and pain.   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   REHAB POTENTIAL: Good  CLINICAL DECISION MAKING: Unstable/unpredictable  EVALUATION COMPLEXITY: High   GOALS: Goals reviewed with patient? Yes  SHORT TERM GOALS: Target date: 01/09/2024  Patient will be independent with initial HEP. Baseline: TBD 12/23/23 - Pt reports no concerns with current HEP exercises Goal status: MET - 01/09/24  2.  Patient will demonstrate decreased fall risk by scoring < 25 sec on TUG. Baseline: TBA Goal status: MET - 12/13/23 - 23 seconds  3.  Patient will be educated on strategies to decrease risk of falls.  Baseline:  Goal status: MET - 12/30/23  4.  Patient will verbalize tips to reduce freezing/festination with gait and turns. Baseline: Patient reports freezing episodes most common in the morning, subsiding as the day progresses. 12/30/23 - reviewed today 01/02/24 - simulated home navigation in places where he is more likely to experiencing freezing Goal status: MET - 01/14/24  LONG TERM GOALS: Target date: 02/20/2024  Patient will be independent with ongoing/advanced HEP for self-management at home incorporating PWR! Moves as indicated .   Baseline:  Goal status: IN PROGRESS - 01/06/24  2.  Patient will be able to ambulate 600' with LRAD with good safety to access community.  Baseline:  01/14/24 - new 4WW/rollator introduced today - cues necessary for safe transfer technique with walker as well as upright posture and proper foot placement between rear wheels of walker with cues for heelstrike on weight acceptance to increase foot clearance Goal status: IN PROGRESS - 01/27/24 - able to walk in excess of 600 feet with 4WW/rollator however limited hip and knee flexion and decreased heel strike observed R>L  3.  Patient will be able to step up/down curb safely with LRAD for safety with community ambulation.  Baseline:  Goal status: IN PROGRESS - 01/14/24 - instructions provided for safe approach and negotiation of curb with 4WW/rollator including proper use of brakes during transition up/down  4.  Patient will demonstrate gait speed of >/= 1.8 ft/sec (0.55 m/s) to be a safe limited community ambulator with decreased risk for recurrent falls.  Baseline: 1.41 ft/sec with RW 01/09/24 -  3.75 ft/sec with RW (goal met) Goal status: MET - 02/11/24 - 3.69 ft/sec with 4WW (gait speed with 4WW maintained since previous testing with RW)  5.  Patient will improve 5xSTS time to </= 16 seconds to demonstrate improved functional strength and transfer efficiency. Baseline: 12/13/23 - 23 seconds  Goal status: MET - 01/14/24 - 14.41 sec  6.  Patient will demonstrate at least 19/24 on DGI or 19/30 on FGA to improve gait stability and reduce risk for falls. Baseline: 01/09/24 - DGI = 15/24 Goal status: MET - 02/11/24 - 19/24  7.  Patient will improve Berg score by at least 8 points to improve safety and stability with ADLs in standing and reduce risk for falls.   Baseline: 12/13/23 - 41/56 Goal status: IN PROGRESS - 01/14/24 - 46/56  8.  Patient will report >/= 69% on ABC scale to demonstrate improved balance confidence and decreased risk for  falls. Baseline: 880 / 1600 = 55.0 % Goal status: MET - 01/14/24 - 1160 / 1600 = 72.5 %  9. Patient will verbalize understanding of local Parkinson's disease community resources, including community fitness post d/c. Baseline:  Goal status: IN PROGRESS - 02/11/24 - Handouts provided for community resources for PD   PLAN:  PT FREQUENCY: 2x/week  PT DURATION: 12 weeks  PLANNED INTERVENTIONS: 97164- PT Re-evaluation, 97750- Physical Performance Testing, 97110-Therapeutic exercises, 97530- Therapeutic activity, 97112- Neuromuscular re-education, 97535- Self Care, [id number]- Manual therapy, [phone]- Gait training, [phone]- Electrical stimulation (unattended), 97035- Ultrasound, 02966- Ionotophoresis 4mg /ml Dexamethasone , 79439 (1-2 muscles), 20561 (3+ muscles)- Dry Needling, Patient/Family education, Balance training, Stair training, Taping, Joint mobilization, Spinal mobilization, Cryotherapy, and Moist heat  PLAN FOR NEXT SESSION: Continue reassessment of standardized balance tests; review 4WW/rollator safety as indicated including proper gait pattern PRN; LE flexibility; core/postural strengthening; review seated, standing and supine PWR! Moves PRN; review education on tips to reduce freezing as indicated   Elijah CHRISTELLA Hidden, PT 02/11/2024, 6:27 PM

## 2024-02-13 ENCOUNTER — Encounter: Payer: Self-pay | Admitting: Physical Therapy

## 2024-02-13 ENCOUNTER — Ambulatory Visit: Admitting: Physical Therapy

## 2024-02-13 ENCOUNTER — Other Ambulatory Visit (HOSPITAL_BASED_OUTPATIENT_CLINIC_OR_DEPARTMENT_OTHER): Payer: Self-pay

## 2024-02-13 DIAGNOSIS — G20A2 Parkinson's disease without dyskinesia, with fluctuations: Secondary | ICD-10-CM | POA: Diagnosis not present

## 2024-02-13 DIAGNOSIS — R296 Repeated falls: Secondary | ICD-10-CM

## 2024-02-13 DIAGNOSIS — R2689 Other abnormalities of gait and mobility: Secondary | ICD-10-CM | POA: Diagnosis not present

## 2024-02-13 DIAGNOSIS — R2681 Unsteadiness on feet: Secondary | ICD-10-CM

## 2024-02-13 DIAGNOSIS — M6281 Muscle weakness (generalized): Secondary | ICD-10-CM

## 2024-02-13 MED ORDER — TAMSULOSIN HCL 0.4 MG PO CAPS
0.4000 mg | ORAL_CAPSULE | Freq: Every day | ORAL | 11 refills | Status: AC
  Filled 2024-03-09: qty 90, 90d supply, fill #0

## 2024-02-13 NOTE — Therapy (Signed)
 OUTPATIENT PHYSICAL THERAPY PARKINSON'S TREATMENT    Patient Name: Vincent Walker MRN: [id number] DOB:1946-03-17, 78 y.o., male Today's Date: 02/13/2024   END OF SESSION:  PT End of Session - 02/13/24 1315     Visit Number 16    Date for Recertification  02/20/24    Authorization Type Medicare & Federal BCBS    Progress Note Due on Visit 20    PT Start Time 1315    PT Stop Time 1359    PT Time Calculation (min) 44 min    Activity Tolerance Patient tolerated treatment well    Behavior During Therapy [facility] for tasks assessed/performed;Flat affect                    Past Medical History:  Diagnosis Date   Adenomatous polyp of ascending colon 10/09/2018   Allergies 07/07/2018   Arthritis    Astigmatism of both eyes with presbyopia 08/21/2022   Blepharitis of upper and lower eyelids of both eyes 01/10/2016   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 Required nasal packing a couple of days ago.  Had a similar episode of epistaxis a little over a year ago.  At that visit, I could not see the exact bleeding spot. EXAMINATION after packing removal reveals several excoriated areas anteriorly but I was unable to see t   Essential hypertension 12/25/2011   Eustachian tube dysfunction, left 02/21/2021   Flank pain 07/19/2022   History of blood transfusion 2010   After Kidney surgery   History of chicken pox    History of renal cell carcinoma 02/08/2015   diagnosed and removed in 2010 Right Monitored by Dr Tanda Moats of urology at [facility] Dr Claudine Alley, nephrology at [facility]   Hyperglycemia 12/23/2015   Increased thyroid  stimulating hormone (TSH) level 06/07/2017   Ingrown left  big toenail 02/14/2015   Left foot pain 02/14/2015   MVP (mitral valve prolapse) 05/14/2016   Parkinson's disease (HCC) 12/23/2015   Posterior vitreous detachment of both eyes 08/21/2022   Primary open-angle glaucoma, left eye, severe stage 05/05/2021   Primary open-angle glaucoma, right eye, moderate stage 01/10/2016   Pseudophakia, both eyes 08/21/2022   Right hip pain 12/12/2016   Stage 3 chronic kidney disease 12/25/2011   Thrombocytopenia 02/08/2015   Tremor 02/14/2015   Past Surgical History:  Procedure Laterality Date   APPENDECTOMY  1995   CATARACT EXTRACTION, BILATERAL     COLONOSCOPY     COLONOSCOPY WITH PROPOFOL  N/A 12/17/2018   Procedure: COLONOSCOPY WITH PROPOFOL ;  Surgeon: Wilhelmenia Aloha Raddle., MD;  Location: [facility] ENDOSCOPY;  Service: Gastroenterology;  Laterality: N/A;   ENDOSCOPIC MUCOSAL RESECTION N/A 12/17/2018   Procedure: ENDOSCOPIC MUCOSAL RESECTION;  Surgeon: Wilhelmenia Aloha Raddle., MD;  Location: [facility] ENDOSCOPY;  Service: Gastroenterology;  Laterality: N/A;   HEMOSTASIS CLIP PLACEMENT  12/17/2018   Procedure: HEMOSTASIS CLIP PLACEMENT;  Surgeon: Wilhelmenia Aloha Raddle., MD;  Location: [facility] ENDOSCOPY;  Service: Gastroenterology;;   left knee scope  2003   POLYPECTOMY  12/17/2018   Procedure: POLYPECTOMY;  Surgeon: Wilhelmenia Aloha Raddle., MD;  Location: [facility] ENDOSCOPY;  Service: Gastroenterology;;   right knee scope  1999   SUBMUCOSAL LIFTING INJECTION  12/17/2018   Procedure: SUBMUCOSAL  LIFTING INJECTION;  Surgeon: Mansouraty, Aloha Raddle., MD;  Location: [facility] ENDOSCOPY;  Service: Gastroenterology;;   TOE SURGERY Left    metal 2nd toe- and top of foot- straighten bone   TONSILLECTOMY     TOTAL KNEE ARTHROPLASTY Left 07/06/2014   TOTAL NEPHRECTOMY Right    Patient Active Problem List   Diagnosis Date Noted   Cellulitis of left foot 09/17/2023   Dementia due to Parkinson's disease 11/20/2022   Dry eye syndrome of both eyes 08/28/2022   Astigmatism of both eyes  with presbyopia 08/21/2022   Choroidal nevus of right eye 08/21/2022   Posterior vitreous detachment of both eyes 08/21/2022   Pseudophakia, both eyes 08/21/2022   Primary open-angle glaucoma, left eye, severe stage 05/05/2021   Eustachian tube dysfunction, left 02/21/2021   Early dry stage nonexudative age-related macular degeneration of both eyes 03/09/2019   Adenomatous polyp of ascending colon 10/09/2018   Epistaxis 07/07/2018   Allergies 07/07/2018   Obesity 06/15/2018   Increased thyroid  stimulating hormone (TSH) level 06/07/2017   MVP (mitral valve prolapse) 05/14/2016   Blepharitis of upper and lower eyelids of both eyes 01/10/2016   Primary open-angle glaucoma, right eye, moderate stage 01/10/2016   Parkinson's disease (HCC) 12/23/2015   Hyperglycemia 12/23/2015   Tremor 02/14/2015   Dyslipidemia 02/08/2015   History of renal cell carcinoma 02/08/2015   Thrombocytopenia 02/08/2015   History of chicken pox    Essential hypertension 12/25/2011   Stage 3 chronic kidney disease 12/25/2011    PCP: Domenica Harlene LABOR, MD   REFERRING PROVIDER: Evonnie Asberry RAMAN, DO   REFERRING DIAG: G20.A2 (ICD-10-CM) - Parkinson's disease without dyskinesia, with fluctuating manifestations (HCC)   THERAPY DIAG:  Parkinson's disease without dyskinesia, with fluctuations (HCC)  Other abnormalities of gait and mobility  Unsteadiness on feet  Muscle weakness (generalized)  Repeated falls  RATIONALE FOR EVALUATION AND TREATMENT: Rehabilitation  ONSET DATE: PD diagnosis 5+ years ago   NEXT MD VISIT: 04/14/24   SUBJECTIVE:                                                                                                                                                                                                         SUBJECTIVE STATEMENT: Pt doing well today.  No concerns noted.  EVAL:  Pt reports worsening of his Parkinson's disease. His biggest concern is that he is having a harder  time walking and had to switch to a 2WW/RW from his cane.  He states his wife also notes more forgetfulness/memory issues. He recognizes increased freezing of gait,  typcially worse in the mornings and lessening as the day progresses.  He is worried about falling and injuring himself to the point where he will need someone to take care of him.  PAIN: Are you having pain? 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 Yes. Number of falls 2 (both while using RW to go around a corner)  LIVING ENVIRONMENT: Lives with: lives with their spouse Lives in: House/apartment Stairs: No Has following equipment at home: Single point cane, Environmental Consultant - 2 wheeled, Grab bars, and Recumbent Bike  OCCUPATION: Retired  PLOF: Independent with household mobility with device, Independent with community mobility with device, Needs assistance with homemaking, and Leisure: mostly sedentary recently     PATIENT GOALS: To get myself going to help bring back some of what I've lost.   OBJECTIVE: (objective measures completed at initial evaluation unless otherwise dated)  DIAGNOSTIC FINDINGS:  09/15/23 - CT CERVICAL SPINE WITHOUT CONTRAST (s/p fall, hit right-side) IMPRESSION: 1. No acute cervical spine fracture. 2. Extensive multilevel cervical degenerative changes.  09/15/23 - CT HEAD WITHOUT CONTRAST (s/p fall, hit right-side) IMPRESSION: 1. No acute intracranial process.  09/15/23 - RIGHT SHOULDER - 2+ VIEW (s/p fall, hit right-side) IMPRESSION: 1. No acute fracture. 2. Bone hypertrophy likely from remote trauma to the distal clavicle. Heterotopic bone formation likely from remote trauma to  the coracoclavicular ligament. 3. Mild high-riding humeral head which may be secondary to a superior rotator cuff tear. 4. Mild glenohumeral osteoarthritis.  09/15/23 - LEFT FOOT - COMPLETE 3+ VIEW (L foot pain) IMPRESSION: 1. No significant change from prior.  No acute fracture. 2. Mild hallux valgus. 3. Postsurgical changes of second and third PIP arthrodesis.  09/29/23 - MR FOOT LEFT W WO CONTRAST FINDINGS: Postoperative change with metal artifact to the distal second, third, and fourth digits. No fracture or dislocation. No erosions. Mild second tarsometatarsal joint osteoarthritis with joint space narrowing and mild degenerative edema. The visualized marrow signal is otherwise unremarkable.   No obvious soft tissue wound/ulceration. This may be obscured by metal artifact. Moderate diffuse muscle atrophy. Mild likely reactive myositis. The tendons are unremarkable. Moderate to severe dorsal subcutaneous edema. No organized fluid collection.   IMPRESSION: The exam is slightly limited by metal artifact.   Moderate to severe dorsal subcutaneous edema. Correlate for lymphedema versus cellulitis.   No obvious soft tissue wound/ulceration or evidence of osteomyelitis. If continued clinical concern, CT scan and/or bone scan could be performed to further characterize. This would limit the metal artifact.   Diffuse muscle atrophy.   Mild degenerative change.  COGNITION: Overall cognitive status: Impaired - decreased short-term memory and recall of new information    SENSATION: WFL  COORDINATION: B heel-toe mildly diminished. Impaired heel to shin bilaterally.  MUSCLE TONE: Increased LE muscle tone noted during attempts at PROM/flexibility assessment.   POSTURE:  rounded shoulders, forward head, increased thoracic kyphosis, and flexed trunk   MUSCLE LENGTH: Hamstrings: mod tight B ITB: mild tight B Piriformis: mod/severe tight B Hip flexors: mod/severe tight B Quads:  mild/mod tight B Heelcord: mild tight B  LOWER EXTREMITY ROM:    Mildly limited B hip and ankle ROM (R>L), mostly due to  limited muscle flexibility as above.   LOWER EXTREMITY MMT:    MMT Right eval Left eval R 01/27/24 L 01/27/24  Hip flexion 4+ 4+ 5 5  Hip extension 4- 4 4+ * 4+ *  Hip abduction 4 4 5  * 5 *  Hip adduction 4+ 4+ 5 * 5 *  Hip internal rotation 5 5 5 5   Hip external rotation 4+ 4+ 5 5  Knee flexion 4+ 4+ 5 5  Knee extension 4+ 4+ 5 5  Ankle dorsiflexion 4+ 4+ 5 5  Ankle plantarflexion      Ankle inversion      Ankle eversion      (Blank rows = not tested, * - tested in sitting d/t new carbidopa -levodopa  pump)  BED MOBILITY:  Minimal assist overall  TRANSFERS: Assistive device utilized: Environmental Consultant - 2 wheeled  Sit to stand: Modified independence and SBA - cues to push up from chair rather than pull up on walker Stand to sit: Modified independence Chair to chair: NT Floor: NT  GAIT: Distance walked: Clinic distances Assistive device utilized: Environmental Consultant - 2 wheeled Level of assistance: Modified independence Gait pattern: step through pattern, decreased stride length, shuffling, festinating, and trunk flexed Comments: Patient reports intermittent freezing episodes, most common in the morning and subsiding as the day progresses  FUNCTIONAL TESTS:  5 times sit to stand: 12/13/23 - 23 seconds back on heels Timed up and go (TUG): 12/13/23 - no device 23 seconds lost balance with turn Manual =  Cognitive =  10 meter walk test: 23.25 sec with 2WW Gait Speed: 1.41 ft/sec with 2WW, limited community ambulator; gait speed of <1.8 ft/sec indicates the risk for recurrent falls  Berg Balance Scale: 12/13/23 - 41/56, 37-45 = Significant (>80%) fall risk  Dynamic gait index: 01/09/24 - 15/24, Scores of 19 or less are predictive of falls in older community living adults   Baseline as of D/C from last PT episode (11/20/22): 11/15/22: 5xSTS = 13.88 sec w/o UE assist (occasional  hands on knees) TUG:  Normal = 9.93 sec Manual = 10.19 sec Cognitive = 13.16 sec (unable to complete cognitive task) : w/o AD = 10.62 sec, 9.32 sec (fastest comfortable speed w/o AD) with SPC = 11.59 sec with B hiking poles = 11.62 sec Gait speed: 3.09 ft/sec w/o AD  3.52 ft/sec (fastest comfortable speed w/o AD) 2.83 ft/sec with SPC  2.82 ft/sec with B hiking poles    11/20/22: Berg = 50/56, 46-51 moderate risk for falls (>50%)  FGA = 21/30, 19-24 = medium risk for fall   PATIENT SURVEYS:  ABC scale: The Activities-Specific Balance Confidence (ABC) Scale 0% 10 20 30  40 50 60 70 80 90 100% No confidence<->completely confident  "How confident are you that you will not lose your balance or become unsteady when you . . .  Date tested 11/28/23 *confidence with RW 01/14/24 *confidence with RW/4WW  Walk around the house 80% 90%  2. Walk up or down stairs 50% 80%  3. Bend over and pick up a slipper from in front of a closet floor 70% 80%  4. Reach for a small can off a shelf at eye level 100% 100%  5. Stand on tip toes and reach for something above your head 60% 70%  6. Stand on a chair and reach for something 0% 20%  7. Sweep the floor 0% 60%  8. Walk outside the house to a car parked in the driveway 80% 100%  9. Get into  or out of a car 90% 100%  10. Walk across a parking lot to the mall 70% 90%  11. Walk up or down a ramp 70% 80%  12. Walk in a crowded mall where people rapidly walk past you 60% 90%  13. Are bumped into by people as you walk through the mall 70% 80%  14. Step onto or off of an escalator while you are holding onto the railing 80% 90%  15. Step onto or off an escalator while holding onto parcels such that you cannot hold onto the railing 0% 30%  16. Walk outside on icy sidewalks 0% 0%  Total: #/16 880 / 1600 = 55.0 % 1160 / 1600 = 72.5 %  50-80% = moderate level of physical functioning <69% indicates risk for recurrent falls in PD   TODAY'S TREATMENT:     02/13/2024  PHYSICAL PERFORMANCE TEST or MEASUREMENT: Berg Balance Test  Sit to Stand Able to stand without using hands and stabilize independently   Standing Unsupported Able to stand safely 2 minutes   Sitting with Back Unsupported but Feet Supported on Floor or Stool Able to sit safely and securely 2 minutes   Stand to Sit Sits safely with minimal use of hands   Transfers Able to transfer safely, minor use of hands   Standing Unsupported with Eyes Closed Able to stand 10 seconds safely   Standing Unsupported with Feet Together Able to place feet together independently and stand 1 minute safely   From Standing, Reach Forward with Outstretched Arm Can reach confidently >25 cm (10)   From Standing Position, Pick up Object from Floor Able to pick up shoe safely and easily   From Standing Position, Turn to Look Behind Over each Shoulder Looks behind one side only/other side shows less weight shift   Turn 360 Degrees Able to turn 360 degrees safely but slowly   Standing Unsupported, Alternately Place Feet on Step/Stool Able to stand independently and safely and complete 8 steps in 20 seconds   Standing Unsupported, One Foot in Front Able to plae foot ahead of the other independently and hold 30 seconds   Standing on One Leg Tries to lift leg/unable to hold 3 seconds but remains standing independently   Total Score 49   Berg comment: 46-51 moderate (>50%)      THERAPEUTIC ACTIVITIES: To improve functional performance.  Demonstration, verbal and tactile cues throughout for technique. Standing with wall ladder for support: Hip extension with looped blue TB at ankles x 10 Hip ABD with looped blue TB at ankles x 10 Hip flexion march + ankle DF with looped blue TB at midfeet x 10  NEUROMUSCULAR RE-EDUCATION: To improve balance, coordination, kinesthesia, posture, proprioception, and reduce fall risk. NuStep - L6 x 6 min - UE/LE to promote reciprocal movement patterns Standing GTB B scap  retraction + shoulder rows x 10 Standing GTB B scap retraction + shoulder extension x 10 Standing GTB B scap retraction + shoulder extension + long arm ER (PWR! Up) x 10   02/11/2024  THERAPEUTIC EXERCISE: To improve strength and endurance.    NuStep - L6 x 6 min - UE/LE to promote reciprocal movement patterns  THERAPEUTIC ACTIVITIES: To improve functional performance.  Demonstration, verbal and tactile cues throughout for technique. = 8.90 sec with 4WW Gait speed = 3.69 ft/sec with 4WW Step-over and back over 1/2 FR targeting increased hip and knee flexion with heel strike on weight acceptance with heel-toe rock, then reversing weight  shift to step back Sequential step-over 1/2 FR and balance pebbles (4 sequential obstacles) aiming for increased hip and knee flexion and heel strike on weight acceptance Lateral sequential step-over 1/2 FR and balance pebbles (4 sequential obstacles) aiming for increased hip and knee flexion and increased lateral motion at hips  PHYSICAL PERFORMANCE TEST or MEASUREMENT: Dynamic Gait Index  Level Surface Normal   Change in Gait Speed Normal   Gait with Horizontal Head Turns Mild Impairment   Gait with Vertical Head Turns Mild Impairment   Gait and Pivot Turn Mild Impairment   Step Over Obstacle Mild Impairment   Step Around Obstacles Normal   Steps Mild Impairment   Total Score 19   DGI comment: 19-22/24 indicates medium fall risk      SELF CARE: Provided education to prevent loss of gains achieved with physical therapy and to prevent future decline in function.  Provided education on community resources related to Parkinson's including support group and educational sessions, community-based PWR! Moves and exercise/movement groups as well as online resources related to PD.     02/04/2024  THERAPEUTIC EXERCISE: To improve strength, endurance, and coordination of movement.  Demonstration, verbal and tactile cues throughout for technique.  NuStep  - L6 x 6 min - UE/LE to promote reciprocal movement patterns  NEUROMUSCULAR RE-EDUCATION: To improve balance, coordination, kinesthesia, posture, proprioception, reduce fall risk, amplitude of movement, speed of movement to reduce bradykinesia, and reduce rigidity. PWR! Stepping: Lateral step and reach with full weight shift to single LE x 10 B (hands reaching for overhead cabinets) Step-through forward & backward x 10 bil with single UE support on counter Side-stepping with looped RTB at knees 2 x 10 ft bil, with looped RTB at ankles 4 x 10 ft bil, hands trailing lightly along counter Fwd/back monster walk with looped RTB at ankles 4 x 10 ft, 1 hand trailing lightly along counter Tandem gait fwd 6 x 52ft, backwards 4 x 6ft, 1 hand trailing lightly along counter   01/30/2024  THERAPEUTIC EXERCISE: To improve strength, endurance, and flexibility.  Demonstration, verbal and tactile cues throughout for technique.  NuStep - L6 x 6 min - UE/LE to promote reciprocal movement patterns Lumbar extension in standing with forearms on wall x 10  SELF CARE: Provided education on positioning for pain management.  Demonstrated supine sleeping position with pillow(s) under knees to take strain off back.  NEUROMUSCULAR RE-EDUCATION: To improve kinesthesia, posture, and reduce rigidity. Anatomical position spine roll-up on wall x 10 for postural correction and decreasing low back strain Anatomical position spine roll-up on inside of doorframe x 10 for postural correction and decreasing low back strain, allowing for increased scapular activation/shoulder extension  THERAPEUTIC ACTIVITIES: To improve functional performance.  Demonstration, verbal and tactile cues throughout for technique. Sequential step-over balance pebbles aiming for increased hip and knee flexion and heel strike on weight acceptance Standing toe raises x 10, cues to avoid leaning back at hips Standing heel-toe raises x 10 *UE support for  all standing exercises on counter for balance  GAIT TRAINING: To normalize gait pattern and improve safety with 4WW/rollator. 180' with 4WW/rollator focusing on carryover of above stepping pattern to promote increased foot clearance and heel strike on weight acceptance  23' with 4WW/rollator focusing on high-stepping pattern to promote increased foot clearance and heel strike on weight acceptance  33' with 4WW/rollator focusing on maintaining increased hip and knee flexion pattern to promote increased foot clearance while still achieving heel strike on weight acceptance  01/27/2024  THERAPEUTIC EXERCISE: To improve strength and endurance.    NuStep - L6 x 6 min - UE/LE to promote reciprocal movement patterns  GAIT TRAINING: To normalize gait pattern and improve safety with 4WW/rollator . 600' with 4WW - good walker proximity but cues for increased hip and knee flexion with heel strike on weight acceptance  THERAPEUTIC ACTIVITIES: To improve functional performance.  Demonstration, verbal and tactile cues throughout for technique. Fwd & back step over 1/2 FR with weight shift x 20 bil to promote increased foot clearance and heel strike during gait Alt toe clears with heel strike on top of 9 stool x 20 Standing heel-toe raises x 10 - limited lift and pt leaning back for toes raises Standing negative toe raises to neutral with heels on 1/2 FR x 10 and off edge of 4 step x 10 Standing negative heel raises to neutral with forefeet on 1/2 FR 2 x 10 *UE support for all standing exercises on wall ladder for balance   01/20/2024  THERAPEUTIC EXERCISE: To improve strength and endurance.    Rec Bike - L3 x 6 min  NEUROMUSCULAR RE-EDUCATION: To improve balance, coordination, kinesthesia, posture, proprioception, reduce fall risk, amplitude of movement, speed of movement to reduce bradykinesia, and reduce rigidity.  Quadruped PWR! Moves: Up x 10 Rock x 10 Twist x 10 - repeated cues for  sequencing  Step - unable  PWR! Stepping: Forward x 10 bil with single UE support on counter Forward x 10 bil w/o UE support - 1 LOB requiring PT assist to correct Backward x 10 bil with single UE support on counter Step-through forward & backward x 10 bil with single UE support on counter B side-stepping along counter but no need for UE support 5 x 10' Step, twist and reach x 10 to alternating sides keeping 1 hand on counter   01/14/2024 - 10th visit PN THERAPEUTIC EXERCISE: To improve strength and endurance.   NuStep - L6 x 6 min - UE/LE to promote reciprocal movement patterns  THERAPEUTIC ACTIVITIES: To improve functional performance.  Demonstration, verbal and tactile cues throughout for technique. ABC scale: 1160 / 1600 = 72.5 %, indicates moderate level physical functioning 5xSTS = 14.41 sec w/o UE assist   PHYSICAL PERFORMANCE TEST or MEASUREMENT: Berg Balance Test  Sit to Stand Able to stand without using hands and stabilize independently   Standing Unsupported Able to stand safely 2 minutes   Sitting with Back Unsupported but Feet Supported on Floor or Stool Able to sit safely and securely 2 minutes   Stand to Sit Sits safely with minimal use of hands   Transfers Able to transfer safely, minor use of hands   Standing Unsupported with Eyes Closed Able to stand 10 seconds safely   Standing Unsupported with Feet Together Able to place feet together independently and stand 1 minute safely   From Standing, Reach Forward with Outstretched Arm Can reach forward >12 cm safely (5)   From Standing Position, Pick up Object from Floor Able to pick up shoe safely and easily   From Standing Position, Turn to Look Behind Over each Shoulder Looks behind one side only/other side shows less weight shift   Turn 360 Degrees Able to turn 360 degrees safely but slowly   Standing Unsupported, Alternately Place Feet on Step/Stool Able to stand independently and complete 8 steps >20 seconds    Standing Unsupported, One Foot in Front Able to take small step independently and hold 30 seconds  Standing on One Leg Tries to lift leg/unable to hold 3 seconds but remains standing independently   Total Score 46   Berg comment: 46-51 = Moderate (>50%) fall risk       SELF CARE: Provided education to improve safety with use of 4WW/rollator for assistive device and to reduce fall risk.  Patient arrived to PT today with new recently purchased 4WW/rollator today (just set up last night).  Provided cues/instruction in safe use of 4WW/rollator including: Safe transfer technique with walker  Need to lock brakes prior to initiation of transfer  Proper hand placement on seating surface/armrest of chair during both sit to stand and stand to sit transition Upright posture and proper foot placement between rear wheels of walker during gait  GAIT TRAINING: To normalize gait pattern and improve safety with 4WW/rollator.  600' with 4 WW/rollator on level surfaces indoors as well as level and uneven surfaces outdoors including sidewalks, inclines, pavement and grass - repeated cues for upright posture and proper foot placement between rear wheels of walker as well as for heelstrike on weight acceptance to increase foot clearance and reduce scuffing/shuffling of feet Curb negotiation - instructions provided for safe approach and negotiation of curb with 4WW/rollator including proper use of brakes during transition up/down   01/09/2024  THERAPEUTIC EXERCISE: To improve strength and endurance.   NuStep - L6 x 6 min - UE/LE to promote reciprocal movement patterns  PHYSICAL PERFORMANCE TEST or MEASUREMENT: Dynamic Gait Index  Level Surface Mild Impairment   Change in Gait Speed Mild Impairment   Gait with Horizontal Head Turns Mild Impairment   Gait with Vertical Head Turns Mild Impairment   Gait and Pivot Turn Mild Impairment   Step Over Obstacle Mild Impairment   Step Around Obstacles Mild Impairment    Steps Moderate Impairment   Total Score 15   DGI comment: Scores of 19 or less are predictive of falls in older community living adults      GAIT TRAINING: To normalize gait pattern and improve safety with 4WW/rollator.  = 8.75 sec with RW Gait speed = 3.75 ft/sec with RW Gait training within clinic with 4WW/rollator: Focusing on posture and walker proximity for improved directional control while turning and negotiating obstacles.   Discussed benefits and drawbacks of use of 4WW as compared to RW in relation to stability and ease of mobility particularly on uneven surfaces - Will plan to introduce 4 WW with outdoor surfaces within next few visits weather permitting. Provided education on safe use of 4WW/rollator brakes during sit to stand transitions and to control speed of walker during gait.   01/06/2024  THERAPEUTIC EXERCISE: To improve strength and endurance.   NuStep - L6 x 6 min - UE/LE to promote reciprocal movement patterns  NEUROMUSCULAR RE-EDUCATION: To improve coordination, kinesthesia, posture, amplitude of movement, speed of movement to reduce bradykinesia, and reduce rigidity.  Prone PWR! Moves (2 pillows under abdomen/hips to reduce low back strain): Up x 10 Rock x 10 Twist x 10 bil Step x 10   01/02/2024  THERAPEUTIC EXERCISE: To improve strength and endurance.   NuStep - L6 x 6 min - UE/LE to promote reciprocal movement patterns  THERAPEUTIC ACTIVITIES: To improve functional performance.  Demonstration, verbal and tactile cues throughout for technique. Reviewed education on tips to prevent freezing with transfers and gait - simulated approach from foyer into den navigating around furniture to his usual chair where he was experiencing the freezing yesterday.    NEUROMUSCULAR RE-EDUCATION:  To improve coordination, kinesthesia, posture, proprioception, amplitude of movement, speed of movement to reduce bradykinesia, and reduce rigidity. Supine PWR! Moves: Up x  10 Rock x 10 Twist x 10 Step x 10    12/30/2023  THERAPEUTIC EXERCISE: To improve strength and endurance.    Rec Bike - L3 x 6 min  SELF CARE: Provided education to reduce fall risk and to promote safe home environment. Reviewed the Check for Safety - Home Fall Prevention Checklist for Older Adults to help identify fall risk hazards in the home along with strategies to reduce fall risk at home  Reviewed education on tips to prevent freezing with transfers and gait.    NEUROMUSCULAR RE-EDUCATION: To improve balance, coordination, kinesthesia, posture, proprioception, reduce fall risk, amplitude of movement, speed of movement to reduce bradykinesia, and reduce rigidity. Seated PWR! Moves: Up x 10 Rock x 10 Twist x 10 Step x 10 PWR! Sit to stand x 10 Standing PWR! Moves: (standing in front on mat table with chair in front for intermittent UE support as needed) Up x 10 Rock x 10 Twist x 10 Step x 10    12/26/2023 THERAPEUTIC EXERCISE: To improve strength, endurance, and flexibility.  Demonstration, verbal and tactile cues throughout for technique.  NuStep - L6 x 6 min - UE/LE to promote reciprocal movement patterns Seated hip hinge HS stretch x 30 bil Seated lunge position hip flexor/quad stretch x 30 bil Seated GTB HS curls x 10 bil Seated GTB LAQ x 10 bil  MANUAL THERAPY: To promote normalized muscle tension, improved flexibility, pain modulation, and reduced pain utilizing connective tissue massage, therapeutic massage, and myofascial release. Foam roller to B quads x ~2 min Roller stick to R/L hamstrings x 1-2 min  NEUROMUSCULAR RE-EDUCATION: To improve balance, coordination, kinesthesia, posture, proprioception, reduce fall risk, and amplitude of movement. Standing 4-way GTB SLR with RW support for balance x 10   12/23/2023  THERAPEUTIC EXERCISE: To improve strength and endurance.  Demonstration, verbal and tactile cues throughout for technique.  NuStep - L6 x 6  min Seated GTB B scap retraction + shoulder row x 10  Seated GTB B scap retraction + shoulder extension x 10  Seated bent over scap retraction + B shoulder extension x 10 with 5# db bilaterally Seated GTB hip ABD/ER clam 2 x 10 Seated GTB hip flexion march x 10  NEUROMUSCULAR RE-EDUCATION: To improve balance, coordination, kinesthesia, posture, proprioception, reduce fall risk, amplitude of movement, speed of movement to reduce bradykinesia, and reduce rigidity. Seated PWR! Moves: Up x 10 Rock x 10 Twist x 10 Step x 10 Standing hip extension with looped GTB at ankles x 10, UE support on RW (encouraged counter support at home) Standing hip abduction with looped GTB at ankles x 10, UE support on RW  THERAPEUTIC ACTIVITIES: To improve functional performance.  Demonstration, verbal and tactile cues throughout for technique. Sit to stand from Airex pad in chair w/o UE assist + GTB hip ABD isometric at distal thighs x 10   12/19/23 Nustep L5x69min UE/LE Seated Lumbar flexion stretch green pball x 30' 3 way Seated hamstring stretch x 30' BLE Seated figure 4 stretch x 30 BLE Seated side bend stretch x 30' B Standing runner stretch x 30' Seated thoracic ext HBH 5x10 Standing pallof press GTB doubled x 10  Church pew weight shifts x 10   12/13/23 Nustep level 5 x 6 minutes TUG =  23 seconds, unsteady with turn no device but did touch wall 5XSTS =  23 seconds  really back on heels Berg = 41/56 On airex standing On airex ball toss, had to have another person for CGA tended to lose balance to the back Step over side to sided and forward back PVC T CGA Direction changes   11/19/2023  SELF CARE:  Reviewed eval findings and role of PT in addressing identified deficits as well as need for further assessment of balance.  Provided education on safe hand placement with RW during transfers to reduce fall risk.   PATIENT EDUCATION:  Education details: reviewed information and provided  handout(s) on community focused exercise programs for pt's with PD or movement disorders to prevent loss of gains achieved with PT and slow progression of PD related deficits  Person educated: Patient Education method: Explanation, Verbal cues, and Handouts Education comprehension: verbalized understanding  HOME EXERCISE PROGRAM: Access Code: [email or web address] URL: [email or web address] Date: 12/30/2023 Prepared by: Elijah Hidden  Exercises - Supine Lower Trunk Rotation  - 2 x daily - 7 x weekly - 2 sets - 10 reps - 10 sec hold - Seated Thoracic Lumbar Extension with Pectoralis Stretch  - 2 x daily - 7 x weekly - 2 sets - 10 reps - 5 sec hold - Seated Hamstring Stretch  - 2 x daily - 7 x weekly - 3 reps - 30 sec hold - Seated Figure 4 Piriformis Stretch  - 2 x daily - 7 x weekly - 3 reps - 30 sec hold - Standing Gastroc Stretch at Counter  - 2 x daily - 7 x weekly - 3 reps - 30 sec hold - Seated Quadratus Lumborum Stretch in Chair  - 2 x daily - 7 x weekly - 3 reps - 30 sec hold - Seated Flexion Stretch with Swiss Ball  - 2 x daily - 7 x weekly - 3 reps - 30 sec hold - Seated Thoracic Flexion and Rotation with Swiss Ball  - 2 x daily - 7 x weekly - 3 reps - 30 sec hold - Seated Bent Over Shoulder Row with Dumbbells  - 1 x daily - 3 x weekly - 2 sets - 10 reps - 3 sec hold - Seated Shoulder Extension with Dumbbells  - 1 x daily - 3 x weekly - 2 sets - 10 reps - 3 sec hold - Standing Hip Extension with Resistance at Ankles and Counter Support  - 1 x daily - 3 x weekly - 2 sets - 10 reps - 3 sec hold - Standing Hip Abduction with Resistance at Ankles and Counter Support  - 1 x daily - 3 x weekly - 2 sets - 10 reps - 3 sec hold  Patient Education - Tips to reduce freezing episodes with standing or walking - Check for Safety  PWR! Moves: - Sitting - Standing - Supine - Prone   ASSESSMENT:  CLINICAL IMPRESSION: Vincent Walker reassessment demonstrating further gain to 49/56, indicating an  8 point improvement from his baseline of 41/56 and meeting LTG #7.  Initiated review of his HEP progressing standing hip strengthening to blue TB resistance adding resisted hip flexion march with DF to help further promote increased foot clearance and heel strike on weight acceptance.  With improving balance patient demonstrating decreased 4WW/AD therefore provided instruction in alternative for seated bent over isotonic rows utilizing GTB resistance and standing to promote improved upright posture and scapular muscle activation.  Vincent Walker will benefit from continued skilled PT to address ongoing postural, strength and balance deficits to improve mobility and activity  tolerance with decreased pain interference and decreased risk for falls.  He has yet to determine if his schedule will allow for him to participate in any of the community based Parkinson's groups upon completion of his current PT episode.    EVAL: Vincent Walker is a 78 y.o. male who was referred to physical therapy for evaluation and treatment for Parkinson's disease.  Patient first diagnosed with Parkinson's 5+ years ago.  He has completed 2 prior PT episodes in 2022/2023 and 2024 within the [facility] system.  Most recent PT episode for Parkinson's disease was 09/25/2022 - 11/20/2022.  Since his last PT episode, he reports worsening of his PD leading to increased gait impairments and need to switch from single-point cane to RW/2WW.  Patient presents with physical impairments of decreased timing and coordination of gait, impaired ambulation, impaired standing balance, abnormal posture, bradykinesia with transfers, impaired activity tolerance, LE weakness, postural instability and decreased safety awareness impacting safe and independent functional mobility.  Examination revealed patient is at risk for falls and functional decline as evidenced by the following objective test measures: Gait speed 1.41 ft/sec, (2.62 ft/sec is needed for community  access and <1.8 ft/sec is indicative of risk for recurrent falls), which demonstrates a significant decline from gait speed of.  He reports increased incidence of freezing of gait, most common in the morning and subsiding as the day progresses.  He has had 2 falls in the past 6 months, both of which occurred while turning with the RW.  Further balance testing with 5xSTS, TUG, Berg and/or DGI/FGA indicated however patient needing to use the bathroom preventing testing today.  ABC scale score of 55% indicates a moderate level of physical functioning, and score of <69% indicates risk for recurrent falls in PD.  Vincent Walker will benefit from skilled PT to address above deficits to improve mobility and activity tolerance to help reach the maximal level of functional independence with mobility and gait with reduced risk for falls.  Patient and his wife demonstrates understanding of this POC and are in agreement with this plan.   OBJECTIVE IMPAIRMENTS: Abnormal gait, decreased activity tolerance, decreased balance, decreased coordination, decreased knowledge of condition, decreased knowledge of use of DME, decreased mobility, difficulty walking, decreased ROM, decreased strength, decreased safety awareness, increased fascial restrictions, impaired perceived functional ability, impaired flexibility, improper body mechanics, postural dysfunction, and pain.   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   REHAB POTENTIAL: Good  CLINICAL DECISION MAKING: Unstable/unpredictable  EVALUATION COMPLEXITY: High   GOALS: Goals reviewed with patient? Yes  SHORT TERM GOALS: Target date: 01/09/2024  Patient will be independent with initial HEP. Baseline: TBD 12/23/23 - Pt reports no concerns with current HEP exercises Goal status: MET - 01/09/24  2.  Patient will demonstrate decreased fall risk by scoring < 25 sec on TUG. Baseline: TBA Goal status: MET - 12/13/23 - 23 seconds  3.  Patient will be educated on strategies to decrease risk of falls.  Baseline:  Goal status: MET - 12/30/23  4.  Patient  will verbalize tips to reduce freezing/festination with gait and turns. Baseline: Patient reports freezing episodes most common in the morning, subsiding as the day progresses. 12/30/23 - reviewed today 01/02/24 - simulated home navigation in places where he is more likely to experiencing freezing Goal status: MET - 01/14/24  LONG TERM GOALS: Target date: 02/20/2024  Patient will be independent with ongoing/advanced HEP for self-management at home incorporating PWR! Moves as indicated .  Baseline:  Goal status: IN PROGRESS - 01/06/24  2.  Patient will be able to ambulate 600' with LRAD with good safety to access community.  Baseline:  01/14/24 - new 4WW/rollator introduced today - cues necessary for safe transfer technique with walker as well as upright posture and proper foot placement between rear wheels of walker with cues for heelstrike on weight acceptance to increase foot clearance 01/27/24 - able to walk in excess of 600 feet with 4WW/rollator however limited hip and knee flexion and decreased heel strike observed R>L Goal status: IN PROGRESS - 02/13/24 - Pt able to ambulate independently on all surfaces with 4WW  3.  Patient will be able to step up/down curb safely with LRAD for safety with community ambulation.  Baseline:  Goal  status: IN PROGRESS - 01/14/24 - instructions provided for safe approach and negotiation of curb with 4WW/rollator including proper use of brakes during transition up/down  4.  Patient will demonstrate gait speed of >/= 1.8 ft/sec (0.55 m/s) to be a safe limited community ambulator with decreased risk for recurrent falls.  Baseline: 1.41 ft/sec with RW 01/09/24 - 3.75 ft/sec with RW (goal met) Goal status: MET - 02/11/24 - 3.69 ft/sec with 4WW (gait speed with 4WW maintained since previous testing with RW)  5.  Patient will improve 5xSTS time to </= 16 seconds to demonstrate improved functional strength and transfer efficiency. Baseline: 12/13/23 - 23 seconds  Goal status: MET - 01/14/24 - 14.41 sec  6.  Patient will demonstrate at least 19/24 on DGI or 19/30 on FGA to improve gait stability and reduce risk for falls. Baseline: 01/09/24 - DGI = 15/24 Goal status: MET - 02/11/24 - 19/24  7.  Patient will improve Berg score by at least 8 points to improve safety and stability with ADLs in standing and reduce risk for falls.   Baseline: 12/13/23 - 41/56 01/14/24 - 46/56 Goal status: MET - 02/13/24 - 49/56  8.  Patient will report >/= 69% on ABC scale to demonstrate improved balance confidence and decreased risk for falls. Baseline: 880 / 1600 = 55.0 % Goal status: MET - 01/14/24 - 1160 / 1600 = 72.5 %  9. Patient will verbalize understanding of local Parkinson's disease community resources, including community fitness post d/c. Baseline:  Goal status: IN PROGRESS - 02/11/24 - Handouts provided for community resources for PD   PLAN:  PT FREQUENCY: 2x/week  PT DURATION: 12 weeks  PLANNED INTERVENTIONS: [id number]- PT Re-evaluation, 97750- Physical Performance Testing, 97110-Therapeutic exercises, 97530- Therapeutic activity, [id number]- Neuromuscular re-education, 97535- Self Care, [id number]- Manual therapy, [id number]- Gait training, [phone]- Electrical stimulation (unattended), [id number]- Ultrasound, [id number]-  Ionotophoresis 4mg /ml Dexamethasone , [id number] (1-2 muscles), [id number] (3+ muscles)- Dry Needling, Patient/Family education, Balance training, Stair training, Taping, Joint mobilization, Spinal mobilization, Cryotherapy, and Moist heat  PLAN FOR NEXT SESSION: PWR! Moves review; reassess 5xSTS, TUG & 10MWT/gait speed for 6-mo PD screen baseline; review 4WW/rollator safety as indicated including proper gait pattern PRN; core/postural strengthening; review education on tips to reduce freezing as indicated   Elijah CHRISTELLA Hidden,  PT 02/13/2024, 8:02 PM

## 2024-02-14 ENCOUNTER — Other Ambulatory Visit (HOSPITAL_BASED_OUTPATIENT_CLINIC_OR_DEPARTMENT_OTHER): Payer: Self-pay

## 2024-02-14 ENCOUNTER — Other Ambulatory Visit: Payer: Self-pay

## 2024-02-14 MED ORDER — SIMBRINZA 1-0.2 % OP SUSP
1.0000 [drp] | Freq: Three times a day (TID) | OPHTHALMIC | 11 refills | Status: AC
  Filled 2024-02-14: qty 8, 27d supply, fill #0
  Filled 2024-03-12: qty 8, 27d supply, fill #1
  Filled 2024-04-06: qty 8, 27d supply, fill #2
  Filled 2024-05-01: qty 8, 27d supply, fill #3

## 2024-02-17 ENCOUNTER — Encounter: Payer: Self-pay | Admitting: Physical Therapy

## 2024-02-17 ENCOUNTER — Ambulatory Visit: Admitting: Physical Therapy

## 2024-02-17 DIAGNOSIS — R296 Repeated falls: Secondary | ICD-10-CM

## 2024-02-17 DIAGNOSIS — G20A2 Parkinson's disease without dyskinesia, with fluctuations: Secondary | ICD-10-CM

## 2024-02-17 DIAGNOSIS — R2689 Other abnormalities of gait and mobility: Secondary | ICD-10-CM

## 2024-02-17 DIAGNOSIS — M6281 Muscle weakness (generalized): Secondary | ICD-10-CM | POA: Diagnosis not present

## 2024-02-17 DIAGNOSIS — R2681 Unsteadiness on feet: Secondary | ICD-10-CM | POA: Diagnosis not present

## 2024-02-17 NOTE — Therapy (Signed)
 OUTPATIENT PHYSICAL THERAPY PARKINSON'S TREATMENT    Patient Name: Vincent Walker MRN: [id number] DOB:[birth year], 78 y.o., male Today's Date: 02/17/2024   END OF SESSION:  PT End of Session - 02/17/24 1400     Visit Number 17    Date for Recertification  02/20/24    Authorization Type Medicare & Federal BCBS    Progress Note Due on Visit 20    PT Start Time 1400    PT Stop Time 1441    PT Time Calculation (min) 41 min    Activity Tolerance Patient tolerated treatment well    Behavior During Therapy [facility] for tasks assessed/performed;Flat affect             Past Medical History:  Diagnosis Date   Adenomatous polyp of ascending colon 10/09/2018   Allergies 07/07/2018   Arthritis    Astigmatism of both eyes with presbyopia 08/21/2022   Blepharitis of upper and lower eyelids of both eyes 01/10/2016   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 Required nasal packing a couple of days ago.  Had a similar episode of epistaxis a little over a year ago.  At that visit, I could not see the exact bleeding spot. EXAMINATION after packing removal reveals several excoriated areas anteriorly but I was unable to see t   Essential hypertension 12/25/2011   Eustachian tube dysfunction, left 02/21/2021   Flank pain 07/19/2022   History of blood transfusion 2010   After Kidney surgery   History of chicken pox    History of renal cell carcinoma 02/08/2015   diagnosed and removed in 2010 Right Monitored by Dr Tanda Moats of urology at [facility] Dr Claudine Alley, nephrology at [facility]   Hyperglycemia 12/23/2015   Increased thyroid  stimulating hormone (TSH) level 06/07/2017   Ingrown left big toenail  02/14/2015   Left foot pain 02/14/2015   MVP (mitral valve prolapse) 05/14/2016   Parkinson's disease (HCC) 12/23/2015   Posterior vitreous detachment of both eyes 08/21/2022   Primary open-angle glaucoma, left eye, severe stage 05/05/2021   Primary open-angle glaucoma, right eye, moderate stage 01/10/2016   Pseudophakia, both eyes 08/21/2022   Right hip pain 12/12/2016   Stage 3 chronic kidney disease 12/25/2011   Thrombocytopenia 02/08/2015   Tremor 02/14/2015   Past Surgical History:  Procedure Laterality Date   APPENDECTOMY  1995   CATARACT EXTRACTION, BILATERAL     COLONOSCOPY     COLONOSCOPY WITH PROPOFOL  N/A 12/17/2018   Procedure: COLONOSCOPY WITH PROPOFOL ;  Surgeon: Wilhelmenia Aloha Raddle., MD;  Location: [facility] ENDOSCOPY;  Service: Gastroenterology;  Laterality: N/A;   ENDOSCOPIC MUCOSAL RESECTION N/A 12/17/2018   Procedure: ENDOSCOPIC MUCOSAL RESECTION;  Surgeon: Wilhelmenia Aloha Raddle., MD;  Location: [facility] ENDOSCOPY;  Service: Gastroenterology;  Laterality: N/A;   HEMOSTASIS CLIP PLACEMENT  12/17/2018   Procedure: HEMOSTASIS CLIP PLACEMENT;  Surgeon: Wilhelmenia Aloha Raddle., MD;  Location: [facility] ENDOSCOPY;  Service: Gastroenterology;;   left knee scope  2003   POLYPECTOMY  12/17/2018   Procedure: POLYPECTOMY;  Surgeon: Wilhelmenia Aloha Raddle., MD;  Location: [facility] ENDOSCOPY;  Service: Gastroenterology;;   right knee scope  1999   SUBMUCOSAL LIFTING INJECTION  12/17/2018   Procedure: SUBMUCOSAL LIFTING INJECTION;  Surgeon: Wilhelmenia Aloha Raddle.,  MD;  Location: [facility] ENDOSCOPY;  Service: Gastroenterology;;   TOE SURGERY Left    metal 2nd toe- and top of foot- straighten bone   TONSILLECTOMY     TOTAL KNEE ARTHROPLASTY Left 07/06/2014   TOTAL NEPHRECTOMY Right    Patient Active Problem List   Diagnosis Date Noted   Cellulitis of left foot 09/17/2023   Dementia due to Parkinson's disease 11/20/2022   Dry eye syndrome of both eyes 08/28/2022   Astigmatism of both eyes with  presbyopia 08/21/2022   Choroidal nevus of right eye 08/21/2022   Posterior vitreous detachment of both eyes 08/21/2022   Pseudophakia, both eyes 08/21/2022   Primary open-angle glaucoma, left eye, severe stage 05/05/2021   Eustachian tube dysfunction, left 02/21/2021   Early dry stage nonexudative age-related macular degeneration of both eyes 03/09/2019   Adenomatous polyp of ascending colon 10/09/2018   Epistaxis 07/07/2018   Allergies 07/07/2018   Obesity 06/15/2018   Increased thyroid  stimulating hormone (TSH) level 06/07/2017   MVP (mitral valve prolapse) 05/14/2016   Blepharitis of upper and lower eyelids of both eyes 01/10/2016   Primary open-angle glaucoma, right eye, moderate stage 01/10/2016   Parkinson's disease (HCC) 12/23/2015   Hyperglycemia 12/23/2015   Tremor 02/14/2015   Dyslipidemia 02/08/2015   History of renal cell carcinoma 02/08/2015   Thrombocytopenia 02/08/2015   History of chicken pox    Essential hypertension 12/25/2011   Stage 3 chronic kidney disease 12/25/2011    PCP: Domenica Harlene LABOR, MD   REFERRING PROVIDER: Evonnie Asberry RAMAN, DO   REFERRING DIAG: G20.A2 (ICD-10-CM) - Parkinson's disease without dyskinesia, with fluctuating manifestations (HCC)   THERAPY DIAG:  Parkinson's disease without dyskinesia, with fluctuations (HCC)  Other abnormalities of gait and mobility  Unsteadiness on feet  Muscle weakness (generalized)  Repeated falls  RATIONALE FOR EVALUATION AND TREATMENT: Rehabilitation  ONSET DATE: PD diagnosis 5+ years ago   NEXT MD VISIT: 04/14/24   SUBJECTIVE:                                                                                                                                                                                                         SUBJECTIVE STATEMENT: Pt doing well today.  No concerns noted.  EVAL:  Pt reports worsening of his Parkinson's disease. His biggest concern is that he is having a harder time  walking and had to switch to a 2WW/RW from his cane.  He states his wife also notes more forgetfulness/memory issues. He recognizes increased freezing of gait, typcially worse in the mornings and lessening  as the day progresses.  He is worried about falling and injuring himself to the point where he will need someone to take care of him.  PAIN: Are you having pain? 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 Yes. Number of falls 2 (both while using RW to go around a corner)  LIVING ENVIRONMENT: Lives with: lives with their spouse Lives in: House/apartment Stairs: No Has following equipment at home: Single point cane, Environmental Consultant - 2 wheeled, Grab bars, and Recumbent Bike  OCCUPATION: Retired  PLOF: Independent with household mobility with device, Independent with community mobility with device, Needs assistance with homemaking, and Leisure: mostly sedentary recently     PATIENT GOALS: To get myself going to help bring back some of what I've lost.   OBJECTIVE: (objective measures completed at initial evaluation unless otherwise dated)  DIAGNOSTIC FINDINGS:  09/15/23 - CT CERVICAL SPINE WITHOUT CONTRAST (s/p fall, hit right-side) IMPRESSION: 1. No acute cervical spine fracture. 2. Extensive multilevel cervical degenerative changes.  09/15/23 - CT HEAD WITHOUT CONTRAST (s/p fall, hit right-side) IMPRESSION: 1. No acute intracranial process.  09/15/23 - RIGHT SHOULDER - 2+ VIEW (s/p fall, hit right-side) IMPRESSION: 1. No acute fracture. 2. Bone hypertrophy likely from remote trauma to the distal clavicle. Heterotopic bone formation likely from remote trauma to the  coracoclavicular ligament. 3. Mild high-riding humeral head which may be secondary to a superior rotator cuff tear. 4. Mild glenohumeral osteoarthritis.  09/15/23 - LEFT FOOT - COMPLETE 3+ VIEW (L foot pain) IMPRESSION: 1. No significant change from prior.  No acute fracture. 2. Mild hallux valgus. 3. Postsurgical changes of second and third PIP arthrodesis.  09/29/23 - MR FOOT LEFT W WO CONTRAST FINDINGS: Postoperative change with metal artifact to the distal second, third, and fourth digits. No fracture or dislocation. No erosions. Mild second tarsometatarsal joint osteoarthritis with joint space narrowing and mild degenerative edema. The visualized marrow signal is otherwise unremarkable.   No obvious soft tissue wound/ulceration. This may be obscured by metal artifact. Moderate diffuse muscle atrophy. Mild likely reactive myositis. The tendons are unremarkable. Moderate to severe dorsal subcutaneous edema. No organized fluid collection.   IMPRESSION: The exam is slightly limited by metal artifact.   Moderate to severe dorsal subcutaneous edema. Correlate for lymphedema versus cellulitis.   No obvious soft tissue wound/ulceration or evidence of osteomyelitis. If continued clinical concern, CT scan and/or bone scan could be performed to further characterize. This would limit the metal artifact.   Diffuse muscle atrophy.   Mild degenerative change.  COGNITION: Overall cognitive status: Impaired - decreased short-term memory and recall of new information    SENSATION: WFL  COORDINATION: B heel-toe mildly diminished. Impaired heel to shin bilaterally.  MUSCLE TONE: Increased LE muscle tone noted during attempts at PROM/flexibility assessment.   POSTURE:  rounded shoulders, forward head, increased thoracic kyphosis, and flexed trunk   MUSCLE LENGTH: Hamstrings: mod tight B ITB: mild tight B Piriformis: mod/severe tight B Hip flexors: mod/severe tight B Quads:  mild/mod tight B Heelcord: mild tight B  LOWER EXTREMITY ROM:    Mildly limited B hip and ankle ROM (R>L), mostly due to limited muscle flexibility as above.  LOWER EXTREMITY MMT:    MMT Right eval Left eval R 01/27/24 L 01/27/24  Hip flexion 4+ 4+ 5 5  Hip extension 4- 4 4+ * 4+ *  Hip abduction 4 4 5  * 5 *  Hip adduction 4+ 4+ 5 * 5 *  Hip internal rotation 5 5 5 5   Hip external rotation 4+ 4+ 5 5  Knee flexion 4+ 4+ 5 5  Knee extension 4+ 4+ 5 5  Ankle dorsiflexion 4+ 4+ 5 5  Ankle plantarflexion      Ankle inversion      Ankle eversion      (Blank rows = not tested, * - tested in sitting d/t new carbidopa -levodopa  pump)  BED MOBILITY:  Minimal assist overall  TRANSFERS: Assistive device utilized: Environmental Consultant - 2 wheeled  Sit to stand: Modified independence and SBA - cues to push up from chair rather than pull up on walker Stand to sit: Modified independence Chair to chair: NT Floor: NT  GAIT: Distance walked: Clinic distances Assistive device utilized: Environmental Consultant - 2 wheeled Level of assistance: Modified independence Gait pattern: step through pattern, decreased stride length, shuffling, festinating, and trunk flexed Comments: Patient reports intermittent freezing episodes, most common in the morning and subsiding as the day progresses  FUNCTIONAL TESTS:  5 times sit to stand: 12/13/23 - 23 seconds back on heels Timed up and go (TUG): 12/13/23 - no device 23 seconds lost balance with turn Manual =  Cognitive =  10 meter walk test: 23.25 sec with 2WW Gait Speed: 1.41 ft/sec with 2WW, limited community ambulator; gait speed of <1.8 ft/sec indicates the risk for recurrent falls  Berg Balance Scale: 12/13/23 - 41/56, 37-45 = Significant (>80%) fall risk  Dynamic gait index: 01/09/24 - 15/24, Scores of 19 or less are predictive of falls in older community living adults   Baseline as of D/C from last PT episode (11/20/22): 11/15/22: 5xSTS = 13.88 sec w/o UE assist (occasional  hands on knees) TUG:  Normal = 9.93 sec Manual = 10.19 sec Cognitive = 13.16 sec (unable to complete cognitive task) : w/o AD = 10.62 sec, 9.32 sec (fastest comfortable speed w/o AD) with SPC = 11.59 sec with B hiking poles = 11.62 sec Gait speed: 3.09 ft/sec w/o AD  3.52 ft/sec (fastest comfortable speed w/o AD) 2.83 ft/sec with SPC  2.82 ft/sec with B hiking poles    11/20/22: Berg = 50/56, 46-51 moderate risk for falls (>50%)  FGA = 21/30, 19-24 = medium risk for fall   PATIENT SURVEYS:  ABC scale: The Activities-Specific Balance Confidence (ABC) Scale 0% 10 20 30  40 50 60 70 80 90 100% No confidence<->completely confident  "How confident are you that you will not lose your balance or become unsteady when you . . .  Date tested 11/28/23 *confidence with RW 01/14/24 *confidence with RW/4WW  Walk around the house 80% 90%  2. Walk up or down stairs 50% 80%  3. Bend over and pick up a slipper from in front of a closet floor 70% 80%  4. Reach for a small can off a shelf at eye level 100% 100%  5. Stand on tip toes and reach for something above your head 60% 70%  6. Stand on a chair and reach for something 0% 20%  7. Sweep the floor 0% 60%  8. Walk outside the house to a car parked in the driveway 80% 100%  9. Get into or out of a car 90% 100%  10. Walk across a parking lot to the mall 70% 90%  11. Walk up or down a ramp 70% 80%  12. Walk in a crowded mall where people rapidly walk past you 60% 90%  13. Are bumped into by people as you walk through the mall 70% 80%  14. Step onto or off of an escalator while you are holding onto the railing 80% 90%  15. Step onto or off an escalator while holding onto parcels such that you cannot hold onto the railing 0% 30%  16. Walk outside on icy sidewalks 0% 0%  Total: #/16 880 / 1600 = 55.0 % 1160 / 1600 = 72.5 %  50-80% = moderate level of physical functioning <69% indicates risk for recurrent falls in PD   TODAY'S TREATMENT:    02/17/2024 THERAPEUTIC EXERCISE: To improve strength and endurance.   NuStep - L6 x 6 min - UE/LE to promote reciprocal movement patterns  NEUROMUSCULAR RE-EDUCATION: To improve balance, coordination, kinesthesia, posture, proprioception, reduce fall risk, amplitude of movement, speed of movement to reduce bradykinesia, and reduce rigidity. Standing PWR! Moves: Up x 10 Rock x 10 Twist x 10 Step x 10  Seated PWR! Moves: Up x 10 Rock x 10 Twist x 10 Step x 10 Supine PWR! Moves: Up x 10 Rock x 10 Twist x 10 Step x 10 Prone and quadruped PWR! Moves deferred due to presence of carbidopa -levodopa  pump  SELF CARE: Provided education to prevent loss of gains achieved with physical therapy and to prevent future decline in function.  Reviewed education on community resources related to Parkinson's including support group and educational sessions, community-based PWR! Moves and exercise/movement groups as well as online resources related to PD.  Also provided information on programs offered at the [facility].   02/13/2024  PHYSICAL PERFORMANCE TEST or MEASUREMENT: Berg Balance Test  Sit to Stand Able to stand without using hands and stabilize independently   Standing Unsupported Able to stand safely 2 minutes   Sitting with Back Unsupported but Feet Supported on Floor or Stool Able to sit safely and securely 2 minutes   Stand to Sit Sits safely with minimal use of hands   Transfers Able to transfer safely, minor use of hands   Standing Unsupported with Eyes Closed Able to stand 10 seconds safely   Standing Unsupported with Feet Together Able to place feet together independently and stand 1 minute safely   From Standing, Reach Forward with Outstretched Arm Can reach confidently >25 cm (10)   From Standing Position, Pick up Object from Floor Able to pick up shoe safely and easily   From Standing Position, Turn to Look Behind Over each Shoulder Looks behind one side only/other side  shows less weight shift   Turn 360 Degrees Able to turn 360 degrees safely but slowly   Standing Unsupported, Alternately Place Feet on Step/Stool Able to stand independently and safely and complete 8 steps in 20 seconds   Standing Unsupported, One Foot in Front Able to plae foot ahead of the other independently and hold 30 seconds   Standing on One Leg Tries to lift leg/unable to hold 3 seconds but remains standing independently   Total Score 49   Berg comment: 46-51 moderate (>50%)      THERAPEUTIC ACTIVITIES: To improve functional performance.  Demonstration, verbal and tactile cues throughout for technique. Standing with wall ladder for support: Hip extension with looped blue TB at ankles x 10 Hip ABD with looped blue TB  at ankles x 10 Hip flexion march + ankle DF with looped blue TB at midfeet x 10  NEUROMUSCULAR RE-EDUCATION: To improve balance, coordination, kinesthesia, posture, proprioception, and reduce fall risk. NuStep - L6 x 6 min - UE/LE to promote reciprocal movement patterns Standing GTB B scap retraction + shoulder rows x 10 Standing GTB B scap retraction + shoulder extension x 10 Standing GTB B scap retraction + shoulder extension + long arm ER (PWR! Up) x 10   02/11/2024  THERAPEUTIC EXERCISE: To improve strength and endurance.    NuStep - L6 x 6 min - UE/LE to promote reciprocal movement patterns  THERAPEUTIC ACTIVITIES: To improve functional performance.  Demonstration, verbal and tactile cues throughout for technique. = 8.90 sec with 4WW Gait speed = 3.69 ft/sec with 4WW Step-over and back over 1/2 FR targeting increased hip and knee flexion with heel strike on weight acceptance with heel-toe rock, then reversing weight shift to step back Sequential step-over 1/2 FR and balance pebbles (4 sequential obstacles) aiming for increased hip and knee flexion and heel strike on weight acceptance Lateral sequential step-over 1/2 FR and balance pebbles (4 sequential  obstacles) aiming for increased hip and knee flexion and increased lateral motion at hips  PHYSICAL PERFORMANCE TEST or MEASUREMENT: Dynamic Gait Index  Level Surface Normal   Change in Gait Speed Normal   Gait with Horizontal Head Turns Mild Impairment   Gait with Vertical Head Turns Mild Impairment   Gait and Pivot Turn Mild Impairment   Step Over Obstacle Mild Impairment   Step Around Obstacles Normal   Steps Mild Impairment   Total Score 19   DGI comment: 19-22/24 indicates medium fall risk      SELF CARE: Provided education to prevent loss of gains achieved with physical therapy and to prevent future decline in function.  Provided education on community resources related to Parkinson's including support group and educational sessions, community-based PWR! Moves and exercise/movement groups as well as online resources related to PD.     02/04/2024  THERAPEUTIC EXERCISE: To improve strength, endurance, and coordination of movement.  Demonstration, verbal and tactile cues throughout for technique.  NuStep - L6 x 6 min - UE/LE to promote reciprocal movement patterns  NEUROMUSCULAR RE-EDUCATION: To improve balance, coordination, kinesthesia, posture, proprioception, reduce fall risk, amplitude of movement, speed of movement to reduce bradykinesia, and reduce rigidity. PWR! Stepping: Lateral step and reach with full weight shift to single LE x 10 B (hands reaching for overhead cabinets) Step-through forward & backward x 10 bil with single UE support on counter Side-stepping with looped RTB at knees 2 x 10 ft bil, with looped RTB at ankles 4 x 10 ft bil, hands trailing lightly along counter Fwd/back monster walk with looped RTB at ankles 4 x 10 ft, 1 hand trailing lightly along counter Tandem gait fwd 6 x 82ft, backwards 4 x 67ft, 1 hand trailing lightly along counter   01/30/2024  THERAPEUTIC EXERCISE: To improve strength, endurance, and flexibility.  Demonstration, verbal and tactile  cues throughout for technique.  NuStep - L6 x 6 min - UE/LE to promote reciprocal movement patterns Lumbar extension in standing with forearms on wall x 10  SELF CARE: Provided education on positioning for pain management.  Demonstrated supine sleeping position with pillow(s) under knees to take strain off back.  NEUROMUSCULAR RE-EDUCATION: To improve kinesthesia, posture, and reduce rigidity. Anatomical position spine roll-up on wall x 10 for postural correction and decreasing low back strain Anatomical  position spine roll-up on inside of doorframe x 10 for postural correction and decreasing low back strain, allowing for increased scapular activation/shoulder extension  THERAPEUTIC ACTIVITIES: To improve functional performance.  Demonstration, verbal and tactile cues throughout for technique. Sequential step-over balance pebbles aiming for increased hip and knee flexion and heel strike on weight acceptance Standing toe raises x 10, cues to avoid leaning back at hips Standing heel-toe raises x 10 *UE support for all standing exercises on counter for balance  GAIT TRAINING: To normalize gait pattern and improve safety with 4WW/rollator. 180' with 4WW/rollator focusing on carryover of above stepping pattern to promote increased foot clearance and heel strike on weight acceptance  46' with 4WW/rollator focusing on high-stepping pattern to promote increased foot clearance and heel strike on weight acceptance  75' with 4WW/rollator focusing on maintaining increased hip and knee flexion pattern to promote increased foot clearance while still achieving heel strike on weight acceptance    01/27/2024  THERAPEUTIC EXERCISE: To improve strength and endurance.    NuStep - L6 x 6 min - UE/LE to promote reciprocal movement patterns  GAIT TRAINING: To normalize gait pattern and improve safety with 4WW/rollator . 600' with 4WW - good walker proximity but cues for increased hip and knee flexion with heel  strike on weight acceptance  THERAPEUTIC ACTIVITIES: To improve functional performance.  Demonstration, verbal and tactile cues throughout for technique. Fwd & back step over 1/2 FR with weight shift x 20 bil to promote increased foot clearance and heel strike during gait Alt toe clears with heel strike on top of 9 stool x 20 Standing heel-toe raises x 10 - limited lift and pt leaning back for toes raises Standing negative toe raises to neutral with heels on 1/2 FR x 10 and off edge of 4 step x 10 Standing negative heel raises to neutral with forefeet on 1/2 FR 2 x 10 *UE support for all standing exercises on wall ladder for balance   01/20/2024  THERAPEUTIC EXERCISE: To improve strength and endurance.    Rec Bike - L3 x 6 min  NEUROMUSCULAR RE-EDUCATION: To improve balance, coordination, kinesthesia, posture, proprioception, reduce fall risk, amplitude of movement, speed of movement to reduce bradykinesia, and reduce rigidity.  Quadruped PWR! Moves: Up x 10 Rock x 10 Twist x 10 - repeated cues for sequencing  Step - unable  PWR! Stepping: Forward x 10 bil with single UE support on counter Forward x 10 bil w/o UE support - 1 LOB requiring PT assist to correct Backward x 10 bil with single UE support on counter Step-through forward & backward x 10 bil with single UE support on counter B side-stepping along counter but no need for UE support 5 x 10' Step, twist and reach x 10 to alternating sides keeping 1 hand on counter   01/14/2024 - 10th visit PN THERAPEUTIC EXERCISE: To improve strength and endurance.   NuStep - L6 x 6 min - UE/LE to promote reciprocal movement patterns  THERAPEUTIC ACTIVITIES: To improve functional performance.  Demonstration, verbal and tactile cues throughout for technique. ABC scale: 1160 / 1600 = 72.5 %, indicates moderate level physical functioning 5xSTS = 14.41 sec w/o UE assist   PHYSICAL PERFORMANCE TEST or MEASUREMENT: Berg Balance Test  Sit to  Stand Able to stand without using hands and stabilize independently   Standing Unsupported Able to stand safely 2 minutes   Sitting with Back Unsupported but Feet Supported on Floor or Stool Able to sit safely and  securely 2 minutes   Stand to Sit Sits safely with minimal use of hands   Transfers Able to transfer safely, minor use of hands   Standing Unsupported with Eyes Closed Able to stand 10 seconds safely   Standing Unsupported with Feet Together Able to place feet together independently and stand 1 minute safely   From Standing, Reach Forward with Outstretched Arm Can reach forward >12 cm safely (5)   From Standing Position, Pick up Object from Floor Able to pick up shoe safely and easily   From Standing Position, Turn to Look Behind Over each Shoulder Looks behind one side only/other side shows less weight shift   Turn 360 Degrees Able to turn 360 degrees safely but slowly   Standing Unsupported, Alternately Place Feet on Step/Stool Able to stand independently and complete 8 steps >20 seconds   Standing Unsupported, One Foot in Front Able to take small step independently and hold 30 seconds   Standing on One Leg Tries to lift leg/unable to hold 3 seconds but remains standing independently   Total Score 46   Berg comment: 46-51 = Moderate (>50%) fall risk       SELF CARE: Provided education to improve safety with use of 4WW/rollator for assistive device and to reduce fall risk.  Patient arrived to PT today with new recently purchased 4WW/rollator today (just set up last night).  Provided cues/instruction in safe use of 4WW/rollator including: Safe transfer technique with walker  Need to lock brakes prior to initiation of transfer  Proper hand placement on seating surface/armrest of chair during both sit to stand and stand to sit transition Upright posture and proper foot placement between rear wheels of walker during gait  GAIT TRAINING: To normalize gait pattern and improve safety  with 4WW/rollator.  600' with 4 WW/rollator on level surfaces indoors as well as level and uneven surfaces outdoors including sidewalks, inclines, pavement and grass - repeated cues for upright posture and proper foot placement between rear wheels of walker as well as for heelstrike on weight acceptance to increase foot clearance and reduce scuffing/shuffling of feet Curb negotiation - instructions provided for safe approach and negotiation of curb with 4WW/rollator including proper use of brakes during transition up/down   01/09/2024  THERAPEUTIC EXERCISE: To improve strength and endurance.   NuStep - L6 x 6 min - UE/LE to promote reciprocal movement patterns  PHYSICAL PERFORMANCE TEST or MEASUREMENT: Dynamic Gait Index  Level Surface Mild Impairment   Change in Gait Speed Mild Impairment   Gait with Horizontal Head Turns Mild Impairment   Gait with Vertical Head Turns Mild Impairment   Gait and Pivot Turn Mild Impairment   Step Over Obstacle Mild Impairment   Step Around Obstacles Mild Impairment   Steps Moderate Impairment   Total Score 15   DGI comment: Scores of 19 or less are predictive of falls in older community living adults      GAIT TRAINING: To normalize gait pattern and improve safety with 4WW/rollator.  = 8.75 sec with RW Gait speed = 3.75 ft/sec with RW Gait training within clinic with 4WW/rollator: Focusing on posture and walker proximity for improved directional control while turning and negotiating obstacles.   Discussed benefits and drawbacks of use of 4WW as compared to RW in relation to stability and ease of mobility particularly on uneven surfaces - Will plan to introduce 4 WW with outdoor surfaces within next few visits weather permitting. Provided education on safe use of 4WW/rollator  brakes during sit to stand transitions and to control speed of walker during gait.   01/06/2024  THERAPEUTIC EXERCISE: To improve strength and endurance.   NuStep - L6 x 6  min - UE/LE to promote reciprocal movement patterns  NEUROMUSCULAR RE-EDUCATION: To improve coordination, kinesthesia, posture, amplitude of movement, speed of movement to reduce bradykinesia, and reduce rigidity.  Prone PWR! Moves (2 pillows under abdomen/hips to reduce low back strain): Up x 10 Rock x 10 Twist x 10 bil Step x 10   01/02/2024  THERAPEUTIC EXERCISE: To improve strength and endurance.   NuStep - L6 x 6 min - UE/LE to promote reciprocal movement patterns  THERAPEUTIC ACTIVITIES: To improve functional performance.  Demonstration, verbal and tactile cues throughout for technique. Reviewed education on tips to prevent freezing with transfers and gait - simulated approach from foyer into den navigating around furniture to his usual chair where he was experiencing the freezing yesterday.    NEUROMUSCULAR RE-EDUCATION: To improve coordination, kinesthesia, posture, proprioception, amplitude of movement, speed of movement to reduce bradykinesia, and reduce rigidity. Supine PWR! Moves: Up x 10 Rock x 10 Twist x 10 Step x 10    12/30/2023  THERAPEUTIC EXERCISE: To improve strength and endurance.    Rec Bike - L3 x 6 min  SELF CARE: Provided education to reduce fall risk and to promote safe home environment. Reviewed the Check for Safety - Home Fall Prevention Checklist for Older Adults to help identify fall risk hazards in the home along with strategies to reduce fall risk at home  Reviewed education on tips to prevent freezing with transfers and gait.    NEUROMUSCULAR RE-EDUCATION: To improve balance, coordination, kinesthesia, posture, proprioception, reduce fall risk, amplitude of movement, speed of movement to reduce bradykinesia, and reduce rigidity. Seated PWR! Moves: Up x 10 Rock x 10 Twist x 10 Step x 10 PWR! Sit to stand x 10 Standing PWR! Moves: (standing in front on mat table with chair in front for intermittent UE support as needed) Up x 10 Rock x 10 Twist  x 10 Step x 10    12/26/2023 THERAPEUTIC EXERCISE: To improve strength, endurance, and flexibility.  Demonstration, verbal and tactile cues throughout for technique.  NuStep - L6 x 6 min - UE/LE to promote reciprocal movement patterns Seated hip hinge HS stretch x 30 bil Seated lunge position hip flexor/quad stretch x 30 bil Seated GTB HS curls x 10 bil Seated GTB LAQ x 10 bil  MANUAL THERAPY: To promote normalized muscle tension, improved flexibility, pain modulation, and reduced pain utilizing connective tissue massage, therapeutic massage, and myofascial release. Foam roller to B quads x ~2 min Roller stick to R/L hamstrings x 1-2 min  NEUROMUSCULAR RE-EDUCATION: To improve balance, coordination, kinesthesia, posture, proprioception, reduce fall risk, and amplitude of movement. Standing 4-way GTB SLR with RW support for balance x 10   12/23/2023  THERAPEUTIC EXERCISE: To improve strength and endurance.  Demonstration, verbal and tactile cues throughout for technique.  NuStep - L6 x 6 min Seated GTB B scap retraction + shoulder row x 10  Seated GTB B scap retraction + shoulder extension x 10  Seated bent over scap retraction + B shoulder extension x 10 with 5# db bilaterally Seated GTB hip ABD/ER clam 2 x 10 Seated GTB hip flexion march x 10  NEUROMUSCULAR RE-EDUCATION: To improve balance, coordination, kinesthesia, posture, proprioception, reduce fall risk, amplitude of movement, speed of movement to reduce bradykinesia, and reduce rigidity. Seated PWR! Moves: Up  x 10 Rock x 10 Twist x 10 Step x 10 Standing hip extension with looped GTB at ankles x 10, UE support on RW (encouraged counter support at home) Standing hip abduction with looped GTB at ankles x 10, UE support on RW  THERAPEUTIC ACTIVITIES: To improve functional performance.  Demonstration, verbal and tactile cues throughout for technique. Sit to stand from Airex pad in chair w/o UE assist + GTB hip ABD isometric  at distal thighs x 10   12/19/23 Nustep L5x65min UE/LE Seated Lumbar flexion stretch green pball x 30' 3 way Seated hamstring stretch x 30' BLE Seated figure 4 stretch x 30 BLE Seated side bend stretch x 30' B Standing runner stretch x 30' Seated thoracic ext HBH 5x10 Standing pallof press GTB doubled x 10  [employer] pew weight shifts x 10   12/13/23 Nustep level 5 x 6 minutes TUG =  23 seconds, unsteady with turn no device but did touch wall 5XSTS = 23 seconds  really back on heels Berg = 41/56 On airex standing On airex ball toss, had to have another person for CGA tended to lose balance to the back Step over side to sided and forward back PVC T CGA Direction changes   11/19/2023  SELF CARE:  Reviewed eval findings and role of PT in addressing identified deficits as well as need for further assessment of balance.  Provided education on safe hand placement with RW during transfers to reduce fall risk.   PATIENT EDUCATION:  Education details: reviewed information and provided handout(s) on community focused exercise programs for pt's with PD or movement disorders to prevent loss of gains achieved with PT and slow progression of PD related deficits  Person educated: Patient Education method: Explanation, Verbal cues, and Handouts Education comprehension: verbalized understanding  HOME EXERCISE PROGRAM: Access Code: [email or web address] URL: [email or web address] Date: 12/30/2023 Prepared by: Elijah Hidden  Exercises - Supine Lower Trunk Rotation  - 2 x daily - 7 x weekly - 2 sets - 10 reps - 10 sec hold - Seated Thoracic Lumbar Extension with Pectoralis Stretch  - 2 x daily - 7 x weekly - 2 sets - 10 reps - 5 sec hold - Seated Hamstring Stretch  - 2 x daily - 7 x weekly - 3 reps - 30 sec hold - Seated Figure 4 Piriformis Stretch  - 2 x daily - 7 x weekly - 3 reps - 30 sec hold - Standing Gastroc Stretch at Counter  - 2 x daily - 7 x weekly - 3 reps - 30 sec hold - Seated  Quadratus Lumborum Stretch in Chair  - 2 x daily - 7 x weekly - 3 reps - 30 sec hold - Seated Flexion Stretch with Swiss Ball  - 2 x daily - 7 x weekly - 3 reps - 30 sec hold - Seated Thoracic Flexion and Rotation with Swiss Ball  - 2 x daily - 7 x weekly - 3 reps - 30 sec hold - Seated Bent Over Shoulder Row with Dumbbells  - 1 x daily - 3 x weekly - 2 sets - 10 reps - 3 sec hold - Seated Shoulder Extension with Dumbbells  - 1 x daily - 3 x weekly - 2 sets - 10 reps - 3 sec hold - Standing Hip Extension with Resistance at Ankles and Counter Support  - 1 x daily - 3 x weekly - 2 sets - 10 reps - 3 sec hold - Standing Hip Abduction with Resistance at  Ankles and Counter Support  - 1 x daily - 3 x weekly - 2 sets - 10 reps - 3 sec hold  Patient Education - Tips to reduce freezing episodes with standing or walking - Check for Safety  PWR! Moves: - Sitting - Standing - Supine - Prone   ASSESSMENT:  CLINICAL IMPRESSION: Vincent Walker denies any concerns from recent HEP review therefore proceeded with PWR! Moves review providing clarification for proper positioning and sequencing of movement patterns.  Prone PWR! Moves deferred due to presence of new carbidopa -levodopa  pump, with quadruped PWR! Moves deferred due to difficulty assuming and maintaining quadruped position.  Vincent Walker reports he has yet to determine if the community-based PD programs will fit with his schedule however he hopes to at least observe some of the classes to see which ones might work best for him.  In the event that the PD classes do not work for him, provided information on community exercise programs at the [facility] to encourage regular participation in community activity outside the home upon completion of current PT program.  One visit remaining in current POC at which time we will review any remaining questions related to a home program and reassess remaining LTG's and standardized testing measures for  establishment of baseline for 62-month PD screen.  Anticipate transition to HEP with discharge from PT after next visit.   EVAL: Vincent Walker is a 77 y.o. male who was referred to physical therapy for evaluation and treatment for Parkinson's disease.  Patient first diagnosed with Parkinson's 5+ years ago.  He has completed 2 prior PT episodes in 2022/2023 and 2024 within the [facility] system.  Most recent PT episode for Parkinson's disease was 09/25/2022 - 11/20/2022.  Since his last PT episode, he reports worsening of his PD leading to increased gait impairments and need to switch from single-point cane to RW/2WW.  Patient presents with physical impairments of decreased timing and coordination of gait, impaired ambulation, impaired standing balance, abnormal posture, bradykinesia with transfers, impaired activity tolerance, LE weakness, postural instability and decreased safety awareness impacting safe and independent functional mobility.  Examination revealed patient is at risk for falls and functional decline as evidenced by the following objective test measures: Gait speed 1.41 ft/sec, (2.62 ft/sec is needed for community access and <1.8 ft/sec is indicative of risk for recurrent falls), which demonstrates a significant decline from gait speed of.  He reports increased incidence of freezing of gait, most common in the morning and subsiding as the day progresses.  He has had 2 falls in the past 6 months, both of which occurred while turning with the RW.  Further balance testing with 5xSTS, TUG, Berg and/or DGI/FGA indicated however patient needing to use the bathroom preventing testing today.  ABC scale score of 55% indicates a moderate level of physical functioning, and score of <69% indicates risk for recurrent falls in PD.  Beckett will benefit from skilled PT to address above deficits to improve mobility and activity tolerance to help reach the maximal level of functional independence with mobility and  gait with reduced risk for falls.  Patient and his wife demonstrates understanding of this POC and are in agreement with this plan.   OBJECTIVE IMPAIRMENTS: Abnormal gait, decreased activity tolerance, decreased balance, decreased coordination, decreased knowledge of condition, decreased knowledge of use of DME, decreased mobility, difficulty walking, decreased ROM, decreased strength, decreased safety awareness, increased fascial restrictions, impaired perceived functional ability, impaired flexibility, improper body mechanics, postural  dysfunction, and pain.   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   REHAB POTENTIAL: Good  CLINICAL DECISION MAKING: Unstable/unpredictable  EVALUATION COMPLEXITY: High   GOALS: Goals reviewed with patient? Yes  SHORT TERM GOALS: Target date: 01/09/2024  Patient will be independent with initial HEP. Baseline: TBD 12/23/23 - Pt reports no concerns with current HEP exercises Goal status: MET - 01/09/24  2.  Patient will demonstrate decreased fall risk by scoring < 25 sec on TUG. Baseline: TBA Goal status: MET - 12/13/23 - 23 seconds  3.  Patient will be educated on strategies to decrease risk of falls.  Baseline:  Goal status: MET - 12/30/23  4.  Patient will verbalize tips to reduce freezing/festination with gait and turns. Baseline: Patient reports freezing episodes most  common in the morning, subsiding as the day progresses. 12/30/23 - reviewed today 01/02/24 - simulated home navigation in places where he is more likely to experiencing freezing Goal status: MET - 01/14/24  LONG TERM GOALS: Target date: 02/20/2024  Patient will be independent with ongoing/advanced HEP for self-management at home incorporating PWR! Moves as indicated .  Baseline:  Goal status: IN PROGRESS - 02/17/24 - met for current   2.  Patient will be able to ambulate 600' with LRAD with good safety to access community.  Baseline:  01/14/24 - new 4WW/rollator introduced today - cues necessary for safe transfer technique with walker as well as upright posture and proper foot placement between rear wheels of walker with cues for heelstrike on weight acceptance to increase foot clearance 01/27/24 - able to walk in excess of 600 feet with 4WW/rollator however limited hip and knee flexion and decreased heel strike observed R>L Goal status: IN PROGRESS - 02/13/24 - Pt able to ambulate independently on all surfaces with 4WW  3.  Patient will be able to step up/down curb safely with LRAD for safety with community ambulation.  Baseline:  Goal status: IN PROGRESS - 01/14/24 - instructions provided for safe approach and negotiation of curb with 4WW/rollator including proper use of brakes during transition up/down  4.  Patient will demonstrate gait speed of >/= 1.8 ft/sec (0.55 m/s) to be a safe limited community ambulator with decreased risk for recurrent falls.  Baseline: 1.41 ft/sec with RW 01/09/24 - 3.75 ft/sec with RW (goal met) Goal status: MET - 02/11/24 - 3.69 ft/sec with 4WW (gait speed with 4WW maintained since previous testing with RW)  5.  Patient will improve 5xSTS time to </= 16 seconds to demonstrate improved functional strength and transfer efficiency. Baseline: 12/13/23 - 23 seconds  Goal status: MET - 01/14/24 - 14.41 sec  6.  Patient will demonstrate at least 19/24 on DGI or  19/30 on FGA to improve gait stability and reduce risk for falls. Baseline: 01/09/24 - DGI = 15/24 Goal status: MET - 02/11/24 - 19/24  7.  Patient will improve Berg score by at least 8 points to improve safety and stability with ADLs in standing and reduce risk for falls.   Baseline: 12/13/23 - 41/56 01/14/24 -  46/56 Goal status: MET - 02/13/24 - 49/56  8.  Patient will report >/= 69% on ABC scale to demonstrate improved balance confidence and decreased risk for falls. Baseline: 880 / 1600 = 55.0 % Goal status: MET - 01/14/24 - 1160 / 1600 = 72.5 %  9. Patient will verbalize understanding of local Parkinson's disease community resources, including community fitness post d/c. Baseline:  02/11/24 - Handouts provided for community resources for PD Goal status: IN PROGRESS - 02/17/24 - Handouts provided for community resources at [employer]   PLAN:  PT FREQUENCY: 2x/week  PT DURATION: 12 weeks  PLANNED INTERVENTIONS: 02835- PT Re-evaluation, 97750- Physical Performance Testing, 97110-Therapeutic exercises, 97530- Therapeutic activity, 97112- Neuromuscular re-education, 97535- Self Care, 02859- Manual therapy, [phone]- Gait training, [phone]- Electrical stimulation (unattended), 97035- Ultrasound, 02966- Ionotophoresis 4mg /ml Dexamethasone , 79439 (1-2 muscles), 20561 (3+ muscles)- Dry Needling, Patient/Family education, Balance training, Stair training, Taping, Joint mobilization, Spinal mobilization, Cryotherapy, and Moist heat  PLAN FOR NEXT SESSION:  reassess 5xSTS, TUG & 10MWT/gait speed for 6-mo PD screen baseline; review 4WW/rollator safety as indicated including proper gait pattern outdoors including curb negotiation; review education on tips to reduce freezing as indicated; anticipate transition to HEP with discharge from PT   Elijah CHRISTELLA Hidden, PT 02/17/2024, 5:09 PM

## 2024-02-20 ENCOUNTER — Encounter: Payer: Self-pay | Admitting: Physical Therapy

## 2024-02-20 ENCOUNTER — Ambulatory Visit: Admitting: Physical Therapy

## 2024-02-20 DIAGNOSIS — R2681 Unsteadiness on feet: Secondary | ICD-10-CM | POA: Diagnosis not present

## 2024-02-20 DIAGNOSIS — G20A2 Parkinson's disease without dyskinesia, with fluctuations: Secondary | ICD-10-CM | POA: Diagnosis not present

## 2024-02-20 DIAGNOSIS — R2689 Other abnormalities of gait and mobility: Secondary | ICD-10-CM | POA: Diagnosis not present

## 2024-02-20 DIAGNOSIS — R296 Repeated falls: Secondary | ICD-10-CM

## 2024-02-20 DIAGNOSIS — M6281 Muscle weakness (generalized): Secondary | ICD-10-CM

## 2024-02-20 NOTE — Therapy (Signed)
 OUTPATIENT PHYSICAL THERAPY PARKINSON'S TREATMENT / DISCHARGE SUMMARY    Patient Name: Vincent Walker MRN: [id number] DOB:01-Jun-1945, 78 y.o., male Today's Date: 02/20/2024   END OF SESSION:  PT End of Session - 02/20/24 1314     Visit Number 18    Date for Recertification  02/20/24    Authorization Type Medicare & Federal BCBS    Progress Note Due on Visit 20    PT Start Time 1314    PT Stop Time 1356    PT Time Calculation (min) 42 min    Activity Tolerance Patient tolerated treatment well    Behavior During Therapy [facility] for tasks assessed/performed;Flat affect              Past Medical History:  Diagnosis Date   Adenomatous polyp of ascending colon 10/09/2018   Allergies 07/07/2018   Arthritis    Astigmatism of both eyes with presbyopia 08/21/2022   Blepharitis of upper and lower eyelids of both eyes 01/10/2016   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 Required nasal packing a couple of days ago.  Had a similar episode of epistaxis a little over a year ago.  At that visit, I could not see the exact bleeding spot. EXAMINATION after packing removal reveals several excoriated areas anteriorly but I was unable to see t   Essential hypertension 12/25/2011   Eustachian tube dysfunction, left 02/21/2021   Flank pain 07/19/2022   History of blood transfusion 2010   After Kidney surgery   History of chicken pox    History of renal cell carcinoma 02/08/2015   diagnosed and removed in 2010 Right Monitored by Dr Tanda Moats of urology at [facility] Dr Claudine Alley, nephrology at [facility]   Hyperglycemia 12/23/2015   Increased thyroid  stimulating hormone (TSH) level 06/07/2017    Ingrown left big toenail 02/14/2015   Left foot pain 02/14/2015   MVP (mitral valve prolapse) 05/14/2016   Parkinson's disease (HCC) 12/23/2015   Posterior vitreous detachment of both eyes 08/21/2022   Primary open-angle glaucoma, left eye, severe stage 05/05/2021   Primary open-angle glaucoma, right eye, moderate stage 01/10/2016   Pseudophakia, both eyes 08/21/2022   Right hip pain 12/12/2016   Stage 3 chronic kidney disease 12/25/2011   Thrombocytopenia 02/08/2015   Tremor 02/14/2015   Past Surgical History:  Procedure Laterality Date   APPENDECTOMY  1995   CATARACT EXTRACTION, BILATERAL     COLONOSCOPY     COLONOSCOPY WITH PROPOFOL  N/A 12/17/2018   Procedure: COLONOSCOPY WITH PROPOFOL ;  Surgeon: Wilhelmenia Aloha Raddle., MD;  Location: [facility] ENDOSCOPY;  Service: Gastroenterology;  Laterality: N/A;   ENDOSCOPIC MUCOSAL RESECTION N/A 12/17/2018   Procedure: ENDOSCOPIC MUCOSAL RESECTION;  Surgeon: Wilhelmenia Aloha Raddle., MD;  Location: [facility] ENDOSCOPY;  Service: Gastroenterology;  Laterality: N/A;   HEMOSTASIS CLIP PLACEMENT  12/17/2018   Procedure: HEMOSTASIS CLIP PLACEMENT;  Surgeon: Wilhelmenia Aloha Raddle., MD;  Location: [facility] ENDOSCOPY;  Service: Gastroenterology;;   left knee scope  2003   POLYPECTOMY  12/17/2018   Procedure: POLYPECTOMY;  Surgeon: Wilhelmenia Aloha Raddle., MD;  Location: [facility] ENDOSCOPY;  Service: Gastroenterology;;   right knee scope  1999   SUBMUCOSAL LIFTING INJECTION  12/17/2018   Procedure: SUBMUCOSAL LIFTING INJECTION;  Surgeon: Wilhelmenia Aloha Raddle., MD;  Location: [facility] ENDOSCOPY;  Service: Gastroenterology;;   TOE SURGERY Left    metal 2nd toe- and top of foot- straighten bone   TONSILLECTOMY     TOTAL KNEE ARTHROPLASTY Left 07/06/2014   TOTAL NEPHRECTOMY Right    Patient Active Problem List   Diagnosis Date Noted   Cellulitis of left foot 09/17/2023   Dementia due to Parkinson's disease 11/20/2022   Dry eye syndrome of both eyes 08/28/2022   Astigmatism  of both eyes with presbyopia 08/21/2022   Choroidal nevus of right eye 08/21/2022   Posterior vitreous detachment of both eyes 08/21/2022   Pseudophakia, both eyes 08/21/2022   Primary open-angle glaucoma, left eye, severe stage 05/05/2021   Eustachian tube dysfunction, left 02/21/2021   Early dry stage nonexudative age-related macular degeneration of both eyes 03/09/2019   Adenomatous polyp of ascending colon 10/09/2018   Epistaxis 07/07/2018   Allergies 07/07/2018   Obesity 06/15/2018   Increased thyroid  stimulating hormone (TSH) level 06/07/2017   MVP (mitral valve prolapse) 05/14/2016   Blepharitis of upper and lower eyelids of both eyes 01/10/2016   Primary open-angle glaucoma, right eye, moderate stage 01/10/2016   Parkinson's disease (HCC) 12/23/2015   Hyperglycemia 12/23/2015   Tremor 02/14/2015   Dyslipidemia 02/08/2015   History of renal cell carcinoma 02/08/2015   Thrombocytopenia 02/08/2015   History of chicken pox    Essential hypertension 12/25/2011   Stage 3 chronic kidney disease 12/25/2011    PCP: Domenica Harlene LABOR, MD   REFERRING PROVIDER: Evonnie Asberry RAMAN, DO   REFERRING DIAG: G20.A2 (ICD-10-CM) - Parkinson's disease without dyskinesia, with fluctuating manifestations (HCC)   THERAPY DIAG:  Parkinson's disease without dyskinesia, with fluctuations (HCC)  Other abnormalities of gait and mobility  Unsteadiness on feet  Muscle weakness (generalized)  Repeated falls  RATIONALE FOR EVALUATION AND TREATMENT: Rehabilitation  ONSET DATE: PD diagnosis 5+ years ago   NEXT MD VISIT: 04/14/24   SUBJECTIVE:                                                                                                                                                                                                         SUBJECTIVE STATEMENT: Pt denies any questions or concerns with the HEP.  He reports he has some equipment at home but is looking into the community options to  help him stay active.  EVAL:  Pt reports worsening of his Parkinson's disease. His biggest concern is that he is having a harder time walking and had to switch to a 2WW/RW  from his cane.  He states his wife also notes more forgetfulness/memory issues. He recognizes increased freezing of gait, typcially worse in the mornings and lessening as the day progresses.  He is worried about falling and injuring himself to the point where he will need someone to take care of him.  PAIN: Are you having pain? 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 Yes. Number of falls 2 (both while using RW to go around a corner)  LIVING ENVIRONMENT: Lives with: lives with their spouse Lives in: House/apartment Stairs: No Has following equipment at home: Single point cane, Environmental Consultant - 2 wheeled, Grab bars, and Recumbent Bike  OCCUPATION: Retired  PLOF: Independent with household mobility with device, Independent with community mobility with device, Needs assistance with homemaking, and Leisure: mostly sedentary recently     PATIENT GOALS: To get myself going to help bring back some of what I've lost.   OBJECTIVE: (objective measures completed at initial evaluation unless otherwise dated)  DIAGNOSTIC FINDINGS:  09/15/23 - CT CERVICAL SPINE WITHOUT CONTRAST (s/p fall, hit right-side) IMPRESSION: 1. No acute cervical spine fracture. 2. Extensive multilevel cervical degenerative changes.  09/15/23 - CT HEAD WITHOUT CONTRAST (s/p fall, hit right-side) IMPRESSION: 1. No acute intracranial process.  09/15/23 - RIGHT SHOULDER - 2+ VIEW (s/p fall, hit right-side) IMPRESSION: 1. No acute  fracture. 2. Bone hypertrophy likely from remote trauma to the distal clavicle. Heterotopic bone formation likely from remote trauma to the coracoclavicular ligament. 3. Mild high-riding humeral head which may be secondary to a superior rotator cuff tear. 4. Mild glenohumeral osteoarthritis.  09/15/23 - LEFT FOOT - COMPLETE 3+ VIEW (L foot pain) IMPRESSION: 1. No significant change from prior.  No acute fracture. 2. Mild hallux valgus. 3. Postsurgical changes of second and third PIP arthrodesis.  09/29/23 - MR FOOT LEFT W WO CONTRAST FINDINGS: Postoperative change with metal artifact to the distal second, third, and fourth digits. No fracture or dislocation. No erosions. Mild second tarsometatarsal joint osteoarthritis with joint space narrowing and mild degenerative edema. The visualized marrow signal is otherwise unremarkable.   No obvious soft tissue wound/ulceration. This may be obscured by metal artifact. Moderate diffuse muscle atrophy. Mild likely reactive myositis. The tendons are unremarkable. Moderate to severe dorsal subcutaneous edema. No organized fluid collection.   IMPRESSION: The exam is slightly limited by metal artifact.   Moderate to severe dorsal subcutaneous edema. Correlate for lymphedema versus cellulitis.   No obvious soft tissue wound/ulceration or evidence of osteomyelitis. If continued clinical concern, CT scan and/or bone scan could be performed to further characterize. This would limit the metal artifact.   Diffuse muscle atrophy.   Mild degenerative change.  COGNITION: Overall cognitive status: Impaired - decreased short-term memory and recall of new information    SENSATION: WFL  COORDINATION: B heel-toe mildly diminished. Impaired heel to shin bilaterally.  MUSCLE TONE: Increased LE muscle tone noted during attempts at PROM/flexibility assessment.   POSTURE:  rounded shoulders, forward head, increased thoracic kyphosis, and flexed trunk    MUSCLE LENGTH: Hamstrings: mod tight B ITB: mild tight B Piriformis: mod/severe tight B Hip flexors: mod/severe tight B Quads: mild/mod tight B Heelcord: mild tight  B  LOWER EXTREMITY ROM:    Mildly limited B hip and ankle ROM (R>L), mostly due to limited muscle flexibility as above.   LOWER EXTREMITY MMT:    MMT Right eval Left eval R 01/27/24 L 01/27/24  Hip flexion 4+ 4+ 5 5  Hip extension 4- 4 4+ * 4+ *  Hip abduction 4 4 5  * 5 *  Hip adduction 4+ 4+ 5 * 5 *  Hip internal rotation 5 5 5 5   Hip external rotation 4+ 4+ 5 5  Knee flexion 4+ 4+ 5 5  Knee extension 4+ 4+ 5 5  Ankle dorsiflexion 4+ 4+ 5 5  Ankle plantarflexion      Ankle inversion      Ankle eversion      (Blank rows = not tested, * - tested in sitting d/t new carbidopa -levodopa  pump)  BED MOBILITY:  Minimal assist overall  TRANSFERS: Assistive device utilized: Environmental Consultant - 2 wheeled  Sit to stand: Modified independence and SBA - cues to push up from chair rather than pull up on walker Stand to sit: Modified independence Chair to chair: NT Floor: NT  GAIT: Distance walked: Clinic distances Assistive device utilized: Environmental Consultant - 2 wheeled Level of assistance: Modified independence Gait pattern: step through pattern, decreased stride length, shuffling, festinating, and trunk flexed Comments: Patient reports intermittent freezing episodes, most common in the morning and subsiding as the day progresses  FUNCTIONAL TESTS:  5 times sit to stand: 12/13/23 - 23 seconds back on heels Timed up and go (TUG): 12/13/23 - no device 23 seconds lost balance with turn Manual =  Cognitive =  10 meter walk test: 23.25 sec with 2WW Gait Speed: 1.41 ft/sec with 2WW, limited community ambulator; gait speed of <1.8 ft/sec indicates the risk for recurrent falls  Berg Balance Scale: 12/13/23 - 41/56, 37-45 = Significant (>80%) fall risk  Dynamic gait index: 01/09/24 - 15/24, Scores of 19 or less are predictive of falls in older  community living adults   Baseline as of D/C from last PT episode (11/20/22): 11/15/22: 5xSTS = 13.88 sec w/o UE assist (occasional hands on knees) TUG:  Normal = 9.93 sec Manual = 10.19 sec Cognitive = 13.16 sec (unable to complete cognitive task) : w/o AD = 10.62 sec, 9.32 sec (fastest comfortable speed w/o AD) with SPC = 11.59 sec with B hiking poles = 11.62 sec Gait speed: 3.09 ft/sec w/o AD  3.52 ft/sec (fastest comfortable speed w/o AD) 2.83 ft/sec with SPC  2.82 ft/sec with B hiking poles    11/20/22: Berg = 50/56, 46-51 moderate risk for falls (>50%)  FGA = 21/30, 19-24 = medium risk for fall   PATIENT SURVEYS:  ABC scale: The Activities-Specific Balance Confidence (ABC) Scale 0% 10 20 30  40 50 60 70 80 90 100% No confidence<->completely confident  "How confident are you that you will not lose your balance or become unsteady when you . . .  Date tested 11/28/23 *confidence with RW 01/14/24 *confidence with RW/4WW 02/20/24   Walk around the house 80% 90% 90%  2. Walk up or down stairs 50% 80% 90%  3. Bend over and pick up a slipper from in front of a closet floor 70% 80% 100%  4. Reach for a small can off a shelf at eye level 100% 100% 100%  5. Stand on tip toes and reach for something above your head 60% 70% 70%  6. Stand on a chair and reach for something 0% 20%  0%  7. Sweep the floor 0% 60% 50%  8. Walk outside the house to a car parked in the driveway 80% 100% 100%  9. Get into or out of a car 90% 100% 100%  10. Walk across a parking lot to the mall 70% 90% 100%  11. Walk up or down a ramp 70% 80% 90%  12. Walk in a crowded mall where people rapidly walk past you 60% 90% 90%  13. Are bumped into by people as you walk through the mall 70% 80% 80%  14. Step onto or off of an escalator while you are holding onto the railing 80% 90% 80%  15. Step onto or off an escalator while holding onto parcels such that you cannot hold onto the railing 0% 30% 0%  16. Walk  outside on icy sidewalks 0% 0% 0%  Total: #/16 880 / 1600 = 55.0 % 1160 / 1600 = 72.5 % 1140 / 1600 = 71.3 %  50-80% = moderate level of physical functioning <69% indicates risk for recurrent falls in PD   TODAY'S TREATMENT:   02/20/2024  THERAPEUTIC EXERCISE: To improve strength and endurance.   Rec Bike - L3 x 6'  THERAPEUTIC ACTIVITIES: To improve functional performance.  Demonstration, verbal and tactile cues throughout for technique.  5xSTS = 12.40 sec TUG:  Normal = 11.06 sec Manual = 11.22 sec Cognitive = 11.00 sec (lost ability to count backward after 1st 2 numbers) = 9.28 sec with 4WW, 10.00 sec w/o AD Gait speed = 3.53 ft/sec with 4WW, 3.28 ft/sec w/o AD ABC scale: 1140 / 1600 = 71.3 %  GAIT: Distance walked: 600+ on level and uneven surfaces, indoors and outdoors including grass Assistive device utilized: Environmental Consultant - 4 wheeled Level of assistance: Modified independence Gait pattern: WFL Curb: modified independent - cues to engage 4WW brakes while stepping up/down   02/17/2024 THERAPEUTIC EXERCISE: To improve strength and endurance.   NuStep - L6 x 6 min - UE/LE to promote reciprocal movement patterns  NEUROMUSCULAR RE-EDUCATION: To improve balance, coordination, kinesthesia, posture, proprioception, reduce fall risk, amplitude of movement, speed of movement to reduce bradykinesia, and reduce rigidity. Standing PWR! Moves: Up x 10 Rock x 10 Twist x 10 Step x 10  Seated PWR! Moves: Up x 10 Rock x 10 Twist x 10 Step x 10 Supine PWR! Moves: Up x 10 Rock x 10 Twist x 10 Step x 10 Prone and quadruped PWR! Moves deferred due to presence of carbidopa -levodopa  pump  SELF CARE: Provided education to prevent loss of gains achieved with physical therapy and to prevent future decline in function.  Reviewed education on community resources related to Parkinson's including support group and educational sessions, community-based PWR! Moves and exercise/movement  groups as well as online resources related to PD.  Also provided information on programs offered at the [facility].   02/13/2024  PHYSICAL PERFORMANCE TEST or MEASUREMENT: Berg Balance Test  Sit to Stand Able to stand without using hands and stabilize independently   Standing Unsupported Able to stand safely 2 minutes   Sitting with Back Unsupported but Feet Supported on Floor or Stool Able to sit safely and securely 2 minutes   Stand to Sit Sits safely with minimal use of hands   Transfers Able to transfer safely, minor use of hands   Standing Unsupported with Eyes Closed Able to stand 10 seconds safely   Standing Unsupported with Feet Together Able to place feet together independently and stand 1  minute safely   From Standing, Reach Forward with Outstretched Arm Can reach confidently >25 cm (10)   From Standing Position, Pick up Object from Floor Able to pick up shoe safely and easily   From Standing Position, Turn to Look Behind Over each Shoulder Looks behind one side only/other side shows less weight shift   Turn 360 Degrees Able to turn 360 degrees safely but slowly   Standing Unsupported, Alternately Place Feet on Step/Stool Able to stand independently and safely and complete 8 steps in 20 seconds   Standing Unsupported, One Foot in Front Able to plae foot ahead of the other independently and hold 30 seconds   Standing on One Leg Tries to lift leg/unable to hold 3 seconds but remains standing independently   Total Score 49   Berg comment: 46-51 moderate (>50%)      THERAPEUTIC ACTIVITIES: To improve functional performance.  Demonstration, verbal and tactile cues throughout for technique. Standing with wall ladder for support: Hip extension with looped blue TB at ankles x 10 Hip ABD with looped blue TB at ankles x 10 Hip flexion march + ankle DF with looped blue TB at midfeet x 10  NEUROMUSCULAR RE-EDUCATION: To improve balance, coordination, kinesthesia, posture,  proprioception, and reduce fall risk. NuStep - L6 x 6 min - UE/LE to promote reciprocal movement patterns Standing GTB B scap retraction + shoulder rows x 10 Standing GTB B scap retraction + shoulder extension x 10 Standing GTB B scap retraction + shoulder extension + long arm ER (PWR! Up) x 10   02/11/2024  THERAPEUTIC EXERCISE: To improve strength and endurance.    NuStep - L6 x 6 min - UE/LE to promote reciprocal movement patterns  THERAPEUTIC ACTIVITIES: To improve functional performance.  Demonstration, verbal and tactile cues throughout for technique. = 8.90 sec with 4WW Gait speed = 3.69 ft/sec with 4WW Step-over and back over 1/2 FR targeting increased hip and knee flexion with heel strike on weight acceptance with heel-toe rock, then reversing weight shift to step back Sequential step-over 1/2 FR and balance pebbles (4 sequential obstacles) aiming for increased hip and knee flexion and heel strike on weight acceptance Lateral sequential step-over 1/2 FR and balance pebbles (4 sequential obstacles) aiming for increased hip and knee flexion and increased lateral motion at hips  PHYSICAL PERFORMANCE TEST or MEASUREMENT: Dynamic Gait Index  Level Surface Normal   Change in Gait Speed Normal   Gait with Horizontal Head Turns Mild Impairment   Gait with Vertical Head Turns Mild Impairment   Gait and Pivot Turn Mild Impairment   Step Over Obstacle Mild Impairment   Step Around Obstacles Normal   Steps Mild Impairment   Total Score 19   DGI comment: 19-22/24 indicates medium fall risk      SELF CARE: Provided education to prevent loss of gains achieved with physical therapy and to prevent future decline in function.  Provided education on community resources related to Parkinson's including support group and educational sessions, community-based PWR! Moves and exercise/movement groups as well as online resources related to PD.     02/04/2024  THERAPEUTIC EXERCISE: To  improve strength, endurance, and coordination of movement.  Demonstration, verbal and tactile cues throughout for technique.  NuStep - L6 x 6 min - UE/LE to promote reciprocal movement patterns  NEUROMUSCULAR RE-EDUCATION: To improve balance, coordination, kinesthesia, posture, proprioception, reduce fall risk, amplitude of movement, speed of movement to reduce bradykinesia, and reduce rigidity. PWR! Stepping: Lateral step and  reach with full weight shift to single LE x 10 B (hands reaching for overhead cabinets) Step-through forward & backward x 10 bil with single UE support on counter Side-stepping with looped RTB at knees 2 x 10 ft bil, with looped RTB at ankles 4 x 10 ft bil, hands trailing lightly along counter Fwd/back monster walk with looped RTB at ankles 4 x 10 ft, 1 hand trailing lightly along counter Tandem gait fwd 6 x 28ft, backwards 4 x 78ft, 1 hand trailing lightly along counter   01/30/2024  THERAPEUTIC EXERCISE: To improve strength, endurance, and flexibility.  Demonstration, verbal and tactile cues throughout for technique.  NuStep - L6 x 6 min - UE/LE to promote reciprocal movement patterns Lumbar extension in standing with forearms on wall x 10  SELF CARE: Provided education on positioning for pain management.  Demonstrated supine sleeping position with pillow(s) under knees to take strain off back.  NEUROMUSCULAR RE-EDUCATION: To improve kinesthesia, posture, and reduce rigidity. Anatomical position spine roll-up on wall x 10 for postural correction and decreasing low back strain Anatomical position spine roll-up on inside of doorframe x 10 for postural correction and decreasing low back strain, allowing for increased scapular activation/shoulder extension  THERAPEUTIC ACTIVITIES: To improve functional performance.  Demonstration, verbal and tactile cues throughout for technique. Sequential step-over balance pebbles aiming for increased hip and knee flexion and heel  strike on weight acceptance Standing toe raises x 10, cues to avoid leaning back at hips Standing heel-toe raises x 10 *UE support for all standing exercises on counter for balance  GAIT TRAINING: To normalize gait pattern and improve safety with 4WW/rollator. 180' with 4WW/rollator focusing on carryover of above stepping pattern to promote increased foot clearance and heel strike on weight acceptance  30' with 4WW/rollator focusing on high-stepping pattern to promote increased foot clearance and heel strike on weight acceptance  69' with 4WW/rollator focusing on maintaining increased hip and knee flexion pattern to promote increased foot clearance while still achieving heel strike on weight acceptance    01/27/2024  THERAPEUTIC EXERCISE: To improve strength and endurance.    NuStep - L6 x 6 min - UE/LE to promote reciprocal movement patterns  GAIT TRAINING: To normalize gait pattern and improve safety with 4WW/rollator . 600' with 4WW - good walker proximity but cues for increased hip and knee flexion with heel strike on weight acceptance  THERAPEUTIC ACTIVITIES: To improve functional performance.  Demonstration, verbal and tactile cues throughout for technique. Fwd & back step over 1/2 FR with weight shift x 20 bil to promote increased foot clearance and heel strike during gait Alt toe clears with heel strike on top of 9 stool x 20 Standing heel-toe raises x 10 - limited lift and pt leaning back for toes raises Standing negative toe raises to neutral with heels on 1/2 FR x 10 and off edge of 4 step x 10 Standing negative heel raises to neutral with forefeet on 1/2 FR 2 x 10 *UE support for all standing exercises on wall ladder for balance   01/20/2024  THERAPEUTIC EXERCISE: To improve strength and endurance.    Rec Bike - L3 x 6 min  NEUROMUSCULAR RE-EDUCATION: To improve balance, coordination, kinesthesia, posture, proprioception, reduce fall risk, amplitude of movement, speed of  movement to reduce bradykinesia, and reduce rigidity.  Quadruped PWR! Moves: Up x 10 Rock x 10 Twist x 10 - repeated cues for sequencing  Step - unable  PWR! Stepping: Forward x 10 bil with single  UE support on counter Forward x 10 bil w/o UE support - 1 LOB requiring PT assist to correct Backward x 10 bil with single UE support on counter Step-through forward & backward x 10 bil with single UE support on counter B side-stepping along counter but no need for UE support 5 x 10' Step, twist and reach x 10 to alternating sides keeping 1 hand on counter   01/14/2024 - 10th visit PN THERAPEUTIC EXERCISE: To improve strength and endurance.   NuStep - L6 x 6 min - UE/LE to promote reciprocal movement patterns  THERAPEUTIC ACTIVITIES: To improve functional performance.  Demonstration, verbal and tactile cues throughout for technique. ABC scale: 1160 / 1600 = 72.5 %, indicates moderate level physical functioning 5xSTS = 14.41 sec w/o UE assist   PHYSICAL PERFORMANCE TEST or MEASUREMENT: Berg Balance Test  Sit to Stand Able to stand without using hands and stabilize independently   Standing Unsupported Able to stand safely 2 minutes   Sitting with Back Unsupported but Feet Supported on Floor or Stool Able to sit safely and securely 2 minutes   Stand to Sit Sits safely with minimal use of hands   Transfers Able to transfer safely, minor use of hands   Standing Unsupported with Eyes Closed Able to stand 10 seconds safely   Standing Unsupported with Feet Together Able to place feet together independently and stand 1 minute safely   From Standing, Reach Forward with Outstretched Arm Can reach forward >12 cm safely (5)   From Standing Position, Pick up Object from Floor Able to pick up shoe safely and easily   From Standing Position, Turn to Look Behind Over each Shoulder Looks behind one side only/other side shows less weight shift   Turn 360 Degrees Able to turn 360 degrees safely but slowly    Standing Unsupported, Alternately Place Feet on Step/Stool Able to stand independently and complete 8 steps >20 seconds   Standing Unsupported, One Foot in Front Able to take small step independently and hold 30 seconds   Standing on One Leg Tries to lift leg/unable to hold 3 seconds but remains standing independently   Total Score 46   Berg comment: 46-51 = Moderate (>50%) fall risk       SELF CARE: Provided education to improve safety with use of 4WW/rollator for assistive device and to reduce fall risk.  Patient arrived to PT today with new recently purchased 4WW/rollator today (just set up last night).  Provided cues/instruction in safe use of 4WW/rollator including: Safe transfer technique with walker  Need to lock brakes prior to initiation of transfer  Proper hand placement on seating surface/armrest of chair during both sit to stand and stand to sit transition Upright posture and proper foot placement between rear wheels of walker during gait  GAIT TRAINING: To normalize gait pattern and improve safety with 4WW/rollator.  600' with 4 WW/rollator on level surfaces indoors as well as level and uneven surfaces outdoors including sidewalks, inclines, pavement and grass - repeated cues for upright posture and proper foot placement between rear wheels of walker as well as for heelstrike on weight acceptance to increase foot clearance and reduce scuffing/shuffling of feet Curb negotiation - instructions provided for safe approach and negotiation of curb with 4WW/rollator including proper use of brakes during transition up/down   01/09/2024  THERAPEUTIC EXERCISE: To improve strength and endurance.   NuStep - L6 x 6 min - UE/LE to promote reciprocal movement patterns  PHYSICAL PERFORMANCE TEST or  MEASUREMENT: Dynamic Gait Index  Level Surface Mild Impairment   Change in Gait Speed Mild Impairment   Gait with Horizontal Head Turns Mild Impairment   Gait with Vertical Head Turns Mild  Impairment   Gait and Pivot Turn Mild Impairment   Step Over Obstacle Mild Impairment   Step Around Obstacles Mild Impairment   Steps Moderate Impairment   Total Score 15   DGI comment: Scores of 19 or less are predictive of falls in older community living adults      GAIT TRAINING: To normalize gait pattern and improve safety with 4WW/rollator.  = 8.75 sec with RW Gait speed = 3.75 ft/sec with RW Gait training within clinic with 4WW/rollator: Focusing on posture and walker proximity for improved directional control while turning and negotiating obstacles.   Discussed benefits and drawbacks of use of 4WW as compared to RW in relation to stability and ease of mobility particularly on uneven surfaces - Will plan to introduce 4 WW with outdoor surfaces within next few visits weather permitting. Provided education on safe use of 4WW/rollator brakes during sit to stand transitions and to control speed of walker during gait.   01/06/2024  THERAPEUTIC EXERCISE: To improve strength and endurance.   NuStep - L6 x 6 min - UE/LE to promote reciprocal movement patterns  NEUROMUSCULAR RE-EDUCATION: To improve coordination, kinesthesia, posture, amplitude of movement, speed of movement to reduce bradykinesia, and reduce rigidity.  Prone PWR! Moves (2 pillows under abdomen/hips to reduce low back strain): Up x 10 Rock x 10 Twist x 10 bil Step x 10   01/02/2024  THERAPEUTIC EXERCISE: To improve strength and endurance.   NuStep - L6 x 6 min - UE/LE to promote reciprocal movement patterns  THERAPEUTIC ACTIVITIES: To improve functional performance.  Demonstration, verbal and tactile cues throughout for technique. Reviewed education on tips to prevent freezing with transfers and gait - simulated approach from foyer into den navigating around furniture to his usual chair where he was experiencing the freezing yesterday.    NEUROMUSCULAR RE-EDUCATION: To improve coordination, kinesthesia, posture,  proprioception, amplitude of movement, speed of movement to reduce bradykinesia, and reduce rigidity. Supine PWR! Moves: Up x 10 Rock x 10 Twist x 10 Step x 10    12/30/2023  THERAPEUTIC EXERCISE: To improve strength and endurance.    Rec Bike - L3 x 6 min  SELF CARE: Provided education to reduce fall risk and to promote safe home environment. Reviewed the Check for Safety - Home Fall Prevention Checklist for Older Adults to help identify fall risk hazards in the home along with strategies to reduce fall risk at home  Reviewed education on tips to prevent freezing with transfers and gait.    NEUROMUSCULAR RE-EDUCATION: To improve balance, coordination, kinesthesia, posture, proprioception, reduce fall risk, amplitude of movement, speed of movement to reduce bradykinesia, and reduce rigidity. Seated PWR! Moves: Up x 10 Rock x 10 Twist x 10 Step x 10 PWR! Sit to stand x 10 Standing PWR! Moves: (standing in front on mat table with chair in front for intermittent UE support as needed) Up x 10 Rock x 10 Twist x 10 Step x 10    12/26/2023 THERAPEUTIC EXERCISE: To improve strength, endurance, and flexibility.  Demonstration, verbal and tactile cues throughout for technique.  NuStep - L6 x 6 min - UE/LE to promote reciprocal movement patterns Seated hip hinge HS stretch x 30 bil Seated lunge position hip flexor/quad stretch x 30 bil Seated GTB HS curls  x 10 bil Seated GTB LAQ x 10 bil  MANUAL THERAPY: To promote normalized muscle tension, improved flexibility, pain modulation, and reduced pain utilizing connective tissue massage, therapeutic massage, and myofascial release. Foam roller to B quads x ~2 min Roller stick to R/L hamstrings x 1-2 min  NEUROMUSCULAR RE-EDUCATION: To improve balance, coordination, kinesthesia, posture, proprioception, reduce fall risk, and amplitude of movement. Standing 4-way GTB SLR with RW support for balance x 10   12/23/2023  THERAPEUTIC  EXERCISE: To improve strength and endurance.  Demonstration, verbal and tactile cues throughout for technique.  NuStep - L6 x 6 min Seated GTB B scap retraction + shoulder row x 10  Seated GTB B scap retraction + shoulder extension x 10  Seated bent over scap retraction + B shoulder extension x 10 with 5# db bilaterally Seated GTB hip ABD/ER clam 2 x 10 Seated GTB hip flexion march x 10  NEUROMUSCULAR RE-EDUCATION: To improve balance, coordination, kinesthesia, posture, proprioception, reduce fall risk, amplitude of movement, speed of movement to reduce bradykinesia, and reduce rigidity. Seated PWR! Moves: Up x 10 Rock x 10 Twist x 10 Step x 10 Standing hip extension with looped GTB at ankles x 10, UE support on RW (encouraged counter support at home) Standing hip abduction with looped GTB at ankles x 10, UE support on RW  THERAPEUTIC ACTIVITIES: To improve functional performance.  Demonstration, verbal and tactile cues throughout for technique. Sit to stand from Airex pad in chair w/o UE assist + GTB hip ABD isometric at distal thighs x 10   12/19/23 Nustep L5x48min UE/LE Seated Lumbar flexion stretch green pball x 30' 3 way Seated hamstring stretch x 30' BLE Seated figure 4 stretch x 30 BLE Seated side bend stretch x 30' B Standing runner stretch x 30' Seated thoracic ext HBH 5x10 Standing pallof press GTB doubled x 10  [employer] pew weight shifts x 10   12/13/23 Nustep level 5 x 6 minutes TUG =  23 seconds, unsteady with turn no device but did touch wall 5XSTS = 23 seconds  really back on heels Berg = 41/56 On airex standing On airex ball toss, had to have another person for CGA tended to lose balance to the back Step over side to sided and forward back PVC T CGA Direction changes   11/19/2023  SELF CARE:  Reviewed eval findings and role of PT in addressing identified deficits as well as need for further assessment of balance.  Provided education on safe hand placement  with RW during transfers to reduce fall risk.   PATIENT EDUCATION:  Education details: reviewed information and provided handout(s) on community focused exercise programs for pt's with PD or movement disorders to prevent loss of gains achieved with PT and slow progression of PD related deficits  Person educated: Patient Education method: Explanation, Verbal cues, and Handouts Education comprehension: verbalized understanding  HOME EXERCISE PROGRAM: Access Code: [email or web address] URL: [email or web address] Date: 12/30/2023 Prepared by: Elijah Hidden  Exercises - Supine Lower Trunk Rotation  - 2 x daily - 7 x weekly - 2 sets - 10 reps - 10 sec hold - Seated Thoracic Lumbar Extension with Pectoralis Stretch  - 2 x daily - 7 x weekly - 2 sets - 10 reps - 5 sec hold - Seated Hamstring Stretch  - 2 x daily - 7 x weekly - 3 reps - 30 sec hold - Seated Figure 4 Piriformis Stretch  - 2 x daily - 7 x weekly - 3 reps -  30 sec hold - Standing Gastroc Stretch at [employer]  - 2 x daily - 7 x weekly - 3 reps - 30 sec hold - Seated Quadratus Lumborum Stretch in Chair  - 2 x daily - 7 x weekly - 3 reps - 30 sec hold - Seated Flexion Stretch with Swiss Ball  - 2 x daily - 7 x weekly - 3 reps - 30 sec hold - Seated Thoracic Flexion and Rotation with Swiss Ball  - 2 x daily - 7 x weekly - 3 reps - 30 sec hold - Seated Bent Over Shoulder Row with Dumbbells  - 1 x daily - 3 x weekly - 2 sets - 10 reps - 3 sec hold - Seated Shoulder Extension with Dumbbells  - 1 x daily - 3 x weekly - 2 sets - 10 reps - 3 sec hold - Standing Hip Extension with Resistance at Ankles and Counter Support  - 1 x daily - 3 x weekly - 2 sets - 10 reps - 3 sec hold - Standing Hip Abduction with Resistance at Ankles and Counter Support  - 1 x daily - 3 x weekly - 2 sets - 10 reps - 3 sec hold  Patient Education - Tips to reduce freezing episodes with standing or walking - Check for Safety  PWR! Moves: - Sitting - Standing -  Supine - Prone   ASSESSMENT:  CLINICAL IMPRESSION: Nochum has demonstrated excellent progress with PT with gains noted in overall B LE, balance and movement patterns.  Overall B LE strength now grossly 5/5.  Standardized balance testing now WNL for 5xSTS at 12.40 seconds and TUGs with Normal TUG = 11.06 sec, Manual TUG = 11.22 sec, and Cognitive TUG = 11.00 sec (although patient lost ability to count backward after 1st 2 numbers).  Gait speed improved from 1.41 ft/sec with 2WW to 3.53 ft/sec with 4WW and 3.28 ft/sec without AD, transitioning him from a limited community ambulator to a community ambulator and moving him above the cutoff for risk for recurrent falls (1.81 ft/sec).  Berg progressed from 41/56 to 49/56, demonstrating an 8 point improvement and indicating a reduction in fall risk from significant (>80%) to moderate (> 50%).  DGI progressed from 15/24 to 19/24, placing him at the threshold for high fall risk.  ABC scale demonstrates improved balance confidence from 55.0% to 71.3%.  All PT goals now met and Marcia feels ready to transition to his HEP, therefore will proceed with discharge from physical therapy for this episode.  Will call him in ~5 months to set-up 67-month PD screen.   EVAL: REYNOLDS KITTEL is a 78 y.o. male who was referred to physical therapy for evaluation and treatment for Parkinson's disease.  Patient first diagnosed with Parkinson's 5+ years ago.  He has completed 2 prior PT episodes in 2022/2023 and 2024 within the [facility] system.  Most recent PT episode for Parkinson's disease was 09/25/2022 - 11/20/2022.  Since his last PT episode, he reports worsening of his PD leading to increased gait impairments and need to switch from single-point cane to RW/2WW.  Patient presents with physical impairments of decreased timing and coordination of gait, impaired ambulation, impaired standing balance, abnormal posture, bradykinesia with transfers, impaired activity tolerance, LE  weakness, postural instability and decreased safety awareness impacting safe and independent functional mobility.  Examination revealed patient is at risk for falls and functional decline as evidenced by the following objective test measures: Gait speed 1.41 ft/sec, (2.62 ft/sec  is needed for community access and <1.8 ft/sec is indicative of risk for recurrent falls), which demonstrates a significant decline from gait speed of.  He reports increased incidence of freezing of gait, most common in the morning and subsiding as the day progresses.  He has had 2 falls in the past 6 months, both of which occurred while turning with the RW.  Further balance testing with 5xSTS, TUG, Berg and/or DGI/FGA indicated however patient needing to use the bathroom preventing testing today.  ABC scale score of 55% indicates a moderate level of physical functioning, and score of <69% indicates risk for recurrent falls in PD.  Eathen will benefit from skilled PT to address above deficits to improve mobility and activity tolerance to help reach the maximal level of functional independence with mobility and gait with reduced risk for falls.  Patient and his wife demonstrates understanding of this POC and are in agreement with this plan.   OBJECTIVE IMPAIRMENTS: Abnormal gait, decreased activity tolerance, decreased balance, decreased coordination, decreased knowledge of condition, decreased knowledge of use of DME, decreased mobility, difficulty walking, decreased ROM, decreased strength, decreased safety awareness, increased fascial restrictions, impaired perceived functional ability, impaired flexibility, improper body mechanics, postural dysfunction, and pain.   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   REHAB POTENTIAL: Good  CLINICAL DECISION MAKING: Unstable/unpredictable  EVALUATION COMPLEXITY: High   GOALS: Goals reviewed with patient? Yes  SHORT TERM GOALS: Target date: 01/09/2024  Patient will be independent with initial HEP. Baseline: TBD 12/23/23 - Pt reports no concerns with current HEP exercises Goal status: MET - 01/09/24  2.  Patient will demonstrate decreased fall risk by scoring < 25 sec on TUG. Baseline: TBA Goal status: MET - 12/13/23 - 23 seconds  3.  Patient will be educated on strategies to decrease risk of falls.  Baseline:  Goal status: MET - 12/30/23  4.  Patient will verbalize tips to reduce freezing/festination with gait and turns. Baseline: Patient reports freezing episodes most common in the morning, subsiding as the day progresses. 12/30/23 - reviewed today 01/02/24 - simulated home navigation in places where he is more likely to experiencing freezing Goal status: MET - 01/14/24  LONG TERM GOALS: Target date: 02/20/2024  Patient will be independent with ongoing/advanced HEP for self-management at home incorporating PWR! Moves as indicated .  Baseline:  02/17/24 - met for current HEP Goal status: MET - 02/20/24  2.  Patient will be able to ambulate 600' with LRAD with good safety to access community.  Baseline:  01/14/24 - new 4WW/rollator introduced today - cues necessary for safe transfer technique with walker as well as upright posture and proper foot placement between rear wheels of walker with cues for heelstrike on weight acceptance to increase foot clearance 01/27/24 - able to walk in excess of  600 feet with 4WW/rollator however limited hip and knee flexion and decreased heel strike observed R>L 02/13/24 - Pt able to ambulate independently on all surfaces with 4WW Goal status: MET - 02/20/24 - Pt able to ambulate 600+  ft  on all surfaces indoors and outdoors with 4WW - modified independent  3.  Patient will be able to step up/down curb safely with LRAD for safety with community ambulation.  Baseline:  01/14/24 - instructions provided for safe approach and negotiation of curb with 4WW/rollator including proper use of brakes during transition up/down Goal status: MET - 02/20/24  4.  Patient will demonstrate gait speed of >/= 1.8 ft/sec (0.55 m/s) to be a safe limited community ambulator with decreased risk for recurrent falls.  Baseline: 1.41 ft/sec with RW 01/09/24 - 3.75 ft/sec with RW (goal met) 02/11/24 - 3.69 ft/sec with 4WW (gait speed with 4WW maintained since previous testing with RW) (goal MET) Goal status: MET - 02/20/24 - 3.53 ft/sec with 4WW, 3.28 ft/sec w/o AD  5.  Patient will improve 5xSTS time to </= 16 seconds to demonstrate improved functional strength and transfer efficiency. Baseline: 12/13/23 - 23 seconds  01/14/24 - 14.41 sec (MET) Goal status: MET - 02/20/24 - 12.40 sec  6.  Patient will demonstrate at least 19/24 on DGI or 19/30 on FGA to improve gait stability and reduce risk for falls. Baseline: 01/09/24 - DGI = 15/24 Goal status: MET - 02/11/24 - 19/24  7.  Patient will improve Berg score by at least 8 points to improve safety and stability with ADLs in standing and reduce risk for falls.   Baseline: 12/13/23 - 41/56 01/14/24 - 46/56 Goal status: MET - 02/13/24 - 49/56  8.  Patient will report >/= 69% on ABC scale to demonstrate improved balance confidence and decreased risk for falls. Baseline: 880 / 1600 = 55.0 % 01/14/24 - 1160 / 1600 = 72.5 % Goal status: MET - 02/20/24 - 1140 / 1600 = 71.3 %  9. Patient will verbalize understanding of local  Parkinson's disease community resources, including community fitness post d/c. Baseline:  02/11/24 - Handouts provided for community resources for PD 02/17/24 - Handouts provided for community resources at [employer] Goal status: MET - 02/20/24   PLAN:  PT FREQUENCY: 2x/week  PT DURATION: 12 weeks  PLANNED INTERVENTIONS: 97164- PT Re-evaluation, 97750- Physical Performance Testing, 97110-Therapeutic exercises, 97530- Therapeutic activity, 97112- Neuromuscular re-education, 97535- Self Care, [id number]- Manual therapy, [id number]- Gait training, [phone]- Electrical stimulation (unattended), 97035- Ultrasound, [id number]- Ionotophoresis 4mg /ml Dexamethasone , 79439 (1-2 muscles), 20561 (3+ muscles)- Dry Needling, Patient/Family education, Balance training, Stair training, Taping, Joint mobilization, Spinal mobilization, Cryotherapy, and Moist heat  PLAN FOR NEXT SESSION:  transition to HEP and possible community program(s) + DC from PT; will call patient in ~5 months to schedule 79-month PD screen   PHYSICAL THERAPY DISCHARGE SUMMARY  Visits from Start of Care: 18  Current functional level related to goals / functional outcomes: Refer to above clinical impression and goal assessment.    Remaining deficits: As above.   Education / Equipment: HEP PWR! Moves [employer] and exercise programs geared towards Parkinson's disease as well as senior exercise groups at local senior center.  Patient agrees to discharge. Patient goals were met. Patient is being discharged due to meeting the stated rehab goals.   Elijah CHRISTELLA Hidden, PT 02/20/2024, 5:25 PM

## 2024-02-25 ENCOUNTER — Other Ambulatory Visit (HOSPITAL_BASED_OUTPATIENT_CLINIC_OR_DEPARTMENT_OTHER): Payer: Self-pay

## 2024-02-25 ENCOUNTER — Other Ambulatory Visit: Payer: Self-pay | Admitting: Family Medicine

## 2024-02-25 ENCOUNTER — Encounter: Payer: Self-pay | Admitting: *Deleted

## 2024-02-25 MED ORDER — DONEPEZIL HCL 10 MG PO TABS
10.0000 mg | ORAL_TABLET | Freq: Every day | ORAL | 2 refills | Status: AC
  Filled 2024-02-25: qty 30, 30d supply, fill #0
  Filled 2024-03-23: qty 30, 30d supply, fill #1
  Filled 2024-04-23: qty 30, 30d supply, fill #2

## 2024-03-02 ENCOUNTER — Other Ambulatory Visit (HOSPITAL_BASED_OUTPATIENT_CLINIC_OR_DEPARTMENT_OTHER): Payer: Self-pay

## 2024-03-02 ENCOUNTER — Other Ambulatory Visit: Payer: Self-pay | Admitting: Neurology

## 2024-03-02 ENCOUNTER — Other Ambulatory Visit: Payer: Self-pay

## 2024-03-02 DIAGNOSIS — G20A1 Parkinson's disease without dyskinesia, without mention of fluctuations: Secondary | ICD-10-CM

## 2024-03-02 DIAGNOSIS — G2581 Restless legs syndrome: Secondary | ICD-10-CM

## 2024-03-02 MED ORDER — MIRTAZAPINE 15 MG PO TABS
15.0000 mg | ORAL_TABLET | Freq: Every day | ORAL | 0 refills | Status: AC
  Filled 2024-03-02: qty 90, 90d supply, fill #0

## 2024-03-04 ENCOUNTER — Ambulatory Visit (INDEPENDENT_AMBULATORY_CARE_PROVIDER_SITE_OTHER): Admitting: Neurology

## 2024-03-04 VITALS — BP 126/84 | HR 72 | Ht 75.0 in | Wt 206.0 lb

## 2024-03-04 DIAGNOSIS — G20A2 Parkinson's disease without dyskinesia, with fluctuations: Secondary | ICD-10-CM | POA: Diagnosis not present

## 2024-03-04 NOTE — Progress Notes (Signed)
 Assessment/Plan:   1.  Parkinsons Disease             - Patient doing really well on Vyalev .  No evidence of infection.  He is having some off early in the morning after they shower, take off the pump and change sites and he was shown how to use the extra dose at that time.  Also was shown that he could use different locations for infusion today.             -- Loading dose: 1.10 mL on day 1 of implementation (given today) and if off infusion for > 3 hours  - Base rate: 0.38 mL/h  - Low rate: 0.34 mL/h  - High rate: 0.42 mL/h  - Extra dose:: 0.15 mL   The following were discussed with the patient:  - Administer in the abdomen, avoiding a 5-cm radius area from the navel. - Rotate the infusion site and use a new infusion set daily - Administer using the patient own [employer]  pump and consumables provided by the company - The medication vials are for single use only. - Discard the syringe and any unused med in the syringe after it has been in the syringe for 24 hours.  2.  RBD/RLS/insomnia             -Continue clonazepam  0.5 mg, full tablet at bedtime.  Tried to stop this medication and things got much worse, especially mood change and daytime hypersomnolence did not get better.  Lower dosages resulted in increasing RBD symptoms.               -on mirtazapine , 15 mg q hs.               -some of night issues due to nocturia.  He is on several medications for nocturia 3.  Anxiety/depression             -Being followed by primary care.  On Depakote  ER, 250 mg daily.  May need to increase this in the future.  Nighttime agitation is becoming more problematic 4.  Daytime hypersomnolence             -Nocturnal polysomnogram was negative, but he was awake most of the night so not sure that it was accurate 5.  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             -Has declined Nuplazid thus far              - continue Donepezil , 10 mg daily.  This has helped hallucinations             -encouraged counseling for wife as caregiving burden increases.   6.  AM dizziness             -don't think related to Parkinsons Disease meds as it starts in the AM prior to taking meds and doesn't persist in the day             -he has d/c the amlodipine              -He is on low-dose atenolol  for PVCs.  They have decreased the dose of time.  They don't think that they can decrease further d/t pvc's.  7.  Renal insuff             -  likely due to dehydration             -may contribute to #6 8.  nocturia  - Now following with urology and on Myrbetriq  and flomax .  This is helping although not resolved. 9.  We will see the patient back in January.  Subjective:   Vincent Walker was seen today in follow up for Parkinsons disease.  My previous records were reviewed prior to todays visit as well as outside records available to me. Pt with wife who supplements hx. Pt doing really well with his initiation of Vyalev .  The only fall he has had was a slip out of the bed.  He generally ambulates with his walker during the daytime.  Wife feels that he is doing much better with the Vyalev  that he did with oral levodopa .  They are having no trouble using the device, including changing it.  They are changing it daily.  No erythema on the abdomen.  Current prescribed movement disorder medications: Vyalev  Clonazepam  0.5 mg, 1 tablet at bedtime  Donepezil  (started last visit) Mirtazapine , 15 mg nightly Depakote , 250 mg daily Inbrija    Levodopa  dosage prior to initiation of Vyalev : carbidopa /levodopa  25/100 , 2 at 8am/2 at 11am/2 at 2pm/1 at 6pm Carbidopa /levodopa  50/200 at bed  PREVIOUS MEDICATIONS: Carbidopa /levodopa  25/100 IR; carbidopa /levodopa  25/100 CR (tried to change to see if would help EDS and didn't so changed back)  ALLERGIES:   Allergies  Allergen Reactions   Nsaids    Sulfa Antibiotics Other (See Comments)     Had a reaction as a child   Tolmetin Other (See Comments)    CURRENT MEDICATIONS:  Outpatient Encounter Medications as of 03/04/2024  Medication Sig   acetaminophen  (TYLENOL ) 500 MG tablet Take 1,000 mg by mouth every 6 (six) hours as needed (pain.).    atenolol  (TENORMIN ) 25 MG tablet Take 0.5 tablets (12.5 mg total) by mouth daily.   atorvastatin  (LIPITOR) 20 MG tablet Take 1 tablet (20 mg total) by mouth daily.   Brinzolamide -Brimonidine  (SIMBRINZA ) 1-0.2 % SUSP Place 1 drop into both eyes 3 (three) times daily.   clonazePAM  (KLONOPIN ) 0.5 MG tablet Take 1 tablet (0.5 mg total) by mouth at bedtime.   divalproex  (DEPAKOTE  ER) 250 MG 24 hr tablet Take 1 tablet (250 mg total) by mouth daily.   donepezil  (ARICEPT ) 10 MG tablet Take 1 tablet (10 mg total) by mouth at bedtime.   Foscarbidopa-Foslevodopa (VYALEV ) 12-240 MG/ML SOLN Inject 0.38 mL/hr into the skin daily.   ketoconazole  (NIZORAL ) 2 % shampoo Use every other day, applying directly to damp scalp, lather, let sit 5-10 minutes and rinse.   Levodopa  (INBRIJA ) 42 MG CAPS TWO capsules is ONE dosage (never inhale just one capsule). You can inhale the capsules as needed up to 5 times per day, separated by 2 hour intervals.   MAGNESIUM GLYCINATE PO Take 200 mg by mouth at bedtime.   mirabegron  ER (MYRBETRIQ ) 50 MG TB24 tablet Take 1 tablet (50 mg total) by mouth daily.   mirtazapine  (REMERON ) 15 MG tablet Take 1 tablet (15 mg total) by mouth at bedtime.   Multiple Vitamins-Minerals (PRESERVISION AREDS 2 PO) Take 1 tablet by mouth daily.   Polyethyl Glycol-Propyl Glycol (LUBRICANT EYE DROPS) 0.4-0.3 % SOLN Place 1 drop into both eyes 3 (three) times daily as needed (dry/irritated eyes.).   tamsulosin  (FLOMAX ) 0.4 MG CAPS capsule Take 1 capsule (0.4 mg total) by mouth daily.   No facility-administered encounter medications on file as of 03/04/2024.  Objective:   PHYSICAL EXAMINATION:    VITALS:   Vitals:   03/04/24 1210  BP:  126/84  Pulse: 72  SpO2: 99%  Weight: 206 lb (93.4 kg)  Height: 6' 3 (1.905 m)     Wt Readings from Last 3 Encounters:  03/04/24 206 lb (93.4 kg)  11/25/23 206 lb 9.6 oz (93.7 kg)  11/13/23 206 lb (93.4 kg)   GEN:  The patient appears stated age and is in NAD. HEENT:  Normocephalic, atraumatic.  The mucous membranes are moist. The superficial temporal arteries are without ropiness or tenderness. CV:  RRR Lungs:  CTAB Neck/HEME:  There are no carotid bruits bilaterally. Skin: There is no erythema, drainage, induration, warmth in the abdomen over the sites that he has used for infusion  Neurological examination:  Orientation: The patient is alert and oriented x3  Cranial nerves: There is good facial symmetry with facial hypomimia. The speech is fluent and clear. Soft palate rises symmetrically.  Hearing is intact to conversational tone. Sensation: Sensation is intact to light touch throughout Motor: Strength is at least antigravity x4.  Movement examination: Tone: There is normal tone today in the upper and lower extremities. Abnormal movements: there is rare rest tremor in the right upper extremity Coordination:  There is no significant decremation today. Gait and Station: The patient pushes off to arise.  He uses his rollator and ambulates well in the hall with that  I have reviewed and interpreted the following labs independently    Chemistry      Component Value Date/Time   NA 141 11/28/2023 1419   K 4.5 11/28/2023 1419   CL 104 11/28/2023 1419   CO2 28 11/28/2023 1419   BUN 16 11/28/2023 1419   CREATININE 1.55 (H) 11/28/2023 1419   CREATININE 1.49 (H) 09/11/2023 1312      Component Value Date/Time   CALCIUM  9.0 11/28/2023 1419   ALKPHOS 98 11/28/2023 1419   AST 23 11/28/2023 1419   ALT 12 11/28/2023 1419   BILITOT 0.5 11/28/2023 1419       Lab Results  Component Value Date   WBC 6.7 11/28/2023   HGB 13.7 11/28/2023   HCT 42.0 11/28/2023   MCV 94.2  11/28/2023   PLT 121.0 (L) 11/28/2023    Lab Results  Component Value Date   TSH 2.15 11/28/2023     Total time spent on today's visit was 40 minutes, including both face-to-face time and nonface-to-face time.  Time included that spent on review of records (prior notes available to me/labs/imaging if pertinent), discussing treatment and goals, answering patient's questions and coordinating care.  Cc:  Domenica Harlene LABOR, MD

## 2024-03-09 ENCOUNTER — Other Ambulatory Visit: Payer: Self-pay

## 2024-03-09 ENCOUNTER — Other Ambulatory Visit (HOSPITAL_BASED_OUTPATIENT_CLINIC_OR_DEPARTMENT_OTHER): Payer: Self-pay

## 2024-03-11 NOTE — Assessment & Plan Note (Signed)
 Well controlled, no changes to meds. Encouraged heart healthy diet such as the DASH diet and exercise as tolerated.

## 2024-03-11 NOTE — Assessment & Plan Note (Signed)
 Mild, asymptomatic.

## 2024-03-11 NOTE — Assessment & Plan Note (Signed)
 hgba1c acceptable, minimize simple carbs. Increase exercise as tolerated.

## 2024-03-11 NOTE — Assessment & Plan Note (Signed)
 Seeing [facility] stable

## 2024-03-11 NOTE — Progress Notes (Unsigned)
 Subjective:    Patient ID: Vincent Walker, male    DOB: 1945-12-24, 78 y.o.   MRN: [id number]  No chief complaint on file.   HPI Discussed the use of AI scribe software for clinical note transcription with the patient, who gave verbal consent to proceed.  History of Present Illness Vincent Walker is an 78 year old male with Parkinson's disease who presents for a follow-up visit.  He has experienced improved management of his Parkinson's disease symptoms since starting the Vyalev  pump, which administers carbidopa -levodopa . The Vyalev  pump has reduced the frequency of medication administration, which was previously challenging due to forgetfulness. The caregiver reports that the pump is easy to set up and manage, although he finds the vest cumbersome.  He has a history of constipation, which has improved recently. The caregiver speculates that the Vyalev  pump may be contributing to this improvement by stabilizing his medication levels. The patient reports that constipation has improved recently.  He is currently using glaucoma drops three times a day, which he finds challenging to remember. The caregiver has implemented reminders to assist with this. He reports stable glaucoma management and is scheduled to see his ophthalmologist next week. No recent changes in glaucoma symptoms.  He has a history of heart issues, with a significant decrease in heart output to 20-25% two years ago, which was unexplained but suspected to be viral in origin. He has been on medication since then, which has improved his heart function.  He has received his flu shot recently and has a history of receiving COVID vaccinations until two years ago, after which he developed heart issues. He is hesitant to continue with COVID vaccinations due to concerns about potential side effects.    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 Required nasal packing a couple of days ago.  Had a similar episode of epistaxis a little over a year ago.  At that visit, I could not see the exact bleeding spot. EXAMINATION after packing removal reveals several excoriated areas anteriorly but I was unable to see t   Essential hypertension 12/25/2011   Eustachian tube dysfunction, left 02/21/2021   Flank pain 07/19/2022   History of blood transfusion 2010   After Kidney surgery   History of chicken pox    History of renal cell carcinoma 02/08/2015   diagnosed and removed in 2010 Right Monitored by Dr Tanda Moats of urology at [facility] Dr Claudine Alley, nephrology at [facility]   Hyperglycemia 12/23/2015   Increased thyroid  stimulating hormone (TSH) level 06/07/2017   Ingrown left big toenail 02/14/2015   Left foot pain 02/14/2015   MVP (mitral valve prolapse) 05/14/2016   Parkinson's disease (HCC) 12/23/2015   Posterior vitreous detachment of both eyes 08/21/2022   Primary open-angle glaucoma, left eye, severe stage 05/05/2021   Primary open-angle glaucoma, right eye, moderate stage 01/10/2016   Pseudophakia, both eyes 08/21/2022   Right hip pain 12/12/2016   Stage 3  chronic kidney disease 12/25/2011   Thrombocytopenia 02/08/2015   Tremor 02/14/2015    Past Surgical History:  Procedure Laterality Date   APPENDECTOMY  1995   CATARACT EXTRACTION, BILATERAL     COLONOSCOPY     COLONOSCOPY WITH PROPOFOL  N/A 12/17/2018   Procedure: COLONOSCOPY WITH PROPOFOL ;  Surgeon: Wilhelmenia  Aloha Raddle., MD;  Location: [facility] ENDOSCOPY;  Service: Gastroenterology;  Laterality: N/A;   ENDOSCOPIC MUCOSAL RESECTION N/A 12/17/2018   Procedure: ENDOSCOPIC MUCOSAL RESECTION;  Surgeon: Wilhelmenia Aloha Raddle., MD;  Location: [facility] ENDOSCOPY;  Service: Gastroenterology;  Laterality: N/A;   HEMOSTASIS CLIP PLACEMENT  12/17/2018   Procedure: HEMOSTASIS CLIP PLACEMENT;  Surgeon: Wilhelmenia Aloha Raddle., MD;  Location: [facility] ENDOSCOPY;  Service: Gastroenterology;;   left knee scope  2003   POLYPECTOMY  12/17/2018   Procedure: POLYPECTOMY;  Surgeon: Wilhelmenia Aloha Raddle., MD;  Location: [facility] ENDOSCOPY;  Service: Gastroenterology;;   right knee scope  1999   SUBMUCOSAL LIFTING INJECTION  12/17/2018   Procedure: SUBMUCOSAL LIFTING INJECTION;  Surgeon: Wilhelmenia Aloha Raddle., MD;  Location: [facility] ENDOSCOPY;  Service: Gastroenterology;;   TOE SURGERY Left    metal 2nd toe- and top of foot- straighten bone   TONSILLECTOMY     TOTAL KNEE ARTHROPLASTY Left 07/06/2014   TOTAL NEPHRECTOMY Right     Family History  Problem Relation Age of Onset   Heart attack Mother    Stroke Mother        swelling in brain stem   Glaucoma Mother    Alcohol abuse Father    Hyperlipidemia Father    Hypertension Father    Diabetes Father    Heart disease Father    Healthy Daughter    Healthy Daughter    Healthy Son    Cancer Maternal Aunt        lung cancer   Cancer Paternal Uncle        bone cancer   Colon cancer Neg Hx    Esophageal cancer Neg Hx    Stomach cancer Neg Hx    Inflammatory bowel disease Neg Hx    Liver disease Neg Hx    Pancreatic cancer Neg Hx    Rectal cancer Neg Hx     Social History   Socioeconomic History   Marital status: Married    Spouse name: Not on file   Number of children: 3   Years of education: 16   Highest education level: Bachelor's degree (e.g., BA, AB, BS)  Occupational History   Occupation: Retired    CommentHydrologist in [employer]  Tobacco Use   Smoking  status: Former    Types: Pipe    Quit date: 11/18/1995    Years since quitting: 28.3   Smokeless tobacco: Never   Tobacco comments:    stopped 20 years ago.  1996  Vaping Use   Vaping status: Never Used  Substance and Sexual Activity   Alcohol use: Not Currently   Drug use: No   Sexual activity: Yes    Comment: lives with wife, retired from chiropodist in plant, no major dietary restrictions   Other Topics Concern   Not on file  Social History Narrative   Right Handed   Lives in a one story home   Drinks one cup of coffee a day   Social Drivers of Health   Financial Resource Strain: Low Risk  (03/11/2024)   Overall Financial Resource Strain (CARDIA)    Difficulty of Paying Living Expenses: Not hard at all  Food Insecurity: No Food Insecurity (03/11/2024)   Hunger Vital Sign    Worried About Running Out of Food in the Last Year: Never true    Ran Out of Food in the Last Year: Never true  Transportation Needs: No Transportation Needs (03/11/2024)   PRAPARE - Administrator, Civil Service (Medical): No    Lack of Transportation (Non-Medical): No  Physical Activity: Insufficiently Active (03/11/2024)   Exercise Vital Sign    Days of Exercise per Week: 1 day    Minutes of Exercise per Session: 10 min  Stress: No Stress Concern Present (03/11/2024)   [employer] of Occupational Health - Occupational Stress Questionnaire    Feeling of Stress: Only a little  Social Connections: Socially Isolated (03/11/2024)   Social Connection and Isolation Panel    Frequency of Communication with Friends and Family: Once a week    Frequency of Social Gatherings with Friends and Family: Once a week    Attends Religious Services: Never    Database Administrator or Organizations: No    Attends Engineer, Structural: Not on file    Marital Status: Married  Catering Manager Violence: Not At Risk (09/30/2023)   Humiliation, Afraid, Rape, and Kick  questionnaire    Fear of Current or Ex-Partner: No    Emotionally Abused: No    Physically Abused: No    Sexually Abused: No    Outpatient Medications Prior to Visit  Medication Sig Dispense Refill   acetaminophen  (TYLENOL ) 500 MG tablet Take 1,000 mg by mouth every 6 (six) hours as needed (pain.).      atenolol  (TENORMIN ) 25 MG tablet Take 0.5 tablets (12.5 mg total) by mouth daily. 45 tablet 1   atorvastatin  (LIPITOR) 20 MG tablet Take 1 tablet (20 mg total) by mouth daily. 90 tablet 3   Brinzolamide -Brimonidine  (SIMBRINZA ) 1-0.2 % SUSP Place 1 drop into both eyes 3 (three) times daily. 8 mL 11   clonazePAM  (KLONOPIN ) 0.5 MG tablet Take 1 tablet (0.5 mg total) by mouth at bedtime. 30 tablet 4   divalproex  (DEPAKOTE  ER) 250 MG 24 hr tablet Take 1 tablet (250 mg total) by mouth daily. 90 tablet 0   donepezil  (ARICEPT ) 10 MG tablet Take 1 tablet (10 mg total) by mouth at bedtime. 30 tablet 2   Foscarbidopa-Foslevodopa (VYALEV ) 12-240 MG/ML SOLN Inject 0.38 mL/hr into the skin daily.     ketoconazole  (NIZORAL ) 2 % shampoo Use every other day, applying directly to damp scalp, lather, let sit 5-10 minutes and rinse. 120 mL 3   Levodopa  (INBRIJA ) 42 MG CAPS TWO capsules is ONE dosage (never inhale just one capsule). You can inhale the capsules as needed up to 5 times per day, separated by 2 hour intervals. 300 capsule 2   MAGNESIUM GLYCINATE PO Take 200 mg by mouth at bedtime.     mirabegron  ER (MYRBETRIQ ) 50 MG TB24 tablet Take 1 tablet (50 mg total) by mouth daily. 30 tablet 5   mirtazapine  (REMERON ) 15 MG tablet Take 1 tablet (15 mg total) by mouth at bedtime. 90 tablet 0   Multiple Vitamins-Minerals (PRESERVISION AREDS 2 PO) Take 1 tablet by mouth daily.     Polyethyl Glycol-Propyl Glycol (LUBRICANT EYE DROPS) 0.4-0.3 % SOLN Place 1 drop into both eyes 3 (three) times daily as needed (dry/irritated eyes.).     tamsulosin  (FLOMAX ) 0.4 MG CAPS capsule Take 1 capsule (0.4 mg total) by mouth  daily. 90 capsule 11   No  facility-administered medications prior to visit.    Allergies  Allergen Reactions   Nsaids    Sulfa Antibiotics Other (See Comments)    Had a reaction as a child   Tolmetin Other (See Comments)    Review of Systems  Constitutional:  Negative for fever and malaise/fatigue.  HENT:  Negative for congestion.   Eyes:  Negative for blurred vision.  Respiratory:  Negative for shortness of breath.   Cardiovascular:  Negative for chest pain, palpitations and leg swelling.  Gastrointestinal:  Negative for abdominal pain, blood in stool and nausea.  Genitourinary:  Negative for dysuria and frequency.  Musculoskeletal:  Negative for falls.  Skin:  Negative for rash.  Neurological:  Positive for tremors. Negative for dizziness, loss of consciousness and headaches.  Endo/Heme/Allergies:  Negative for environmental allergies.  Psychiatric/Behavioral:  Negative for depression. The patient is not nervous/anxious.        Objective:    Physical Exam Vitals reviewed.  Constitutional:      Appearance: Normal appearance. He is not ill-appearing.  HENT:     Head: Normocephalic and atraumatic.     Nose: Nose normal.  Eyes:     Conjunctiva/sclera: Conjunctivae normal.  Cardiovascular:     Rate and Rhythm: Normal rate.     Pulses: Normal pulses.     Heart sounds: Normal heart sounds. No murmur heard. Pulmonary:     Effort: Pulmonary effort is normal.     Breath sounds: Normal breath sounds. No wheezing.  Abdominal:     Palpations: Abdomen is soft. There is no mass.     Tenderness: There is no abdominal tenderness.  Musculoskeletal:     Cervical back: Normal range of motion.     Right lower leg: No edema.     Left lower leg: No edema.  Skin:    General: Skin is warm and dry.  Neurological:     General: No focal deficit present.     Mental Status: He is alert and oriented to person, place, and time.  Psychiatric:        Mood and Affect: Mood normal.     There were no vitals taken for this visit. Wt Readings from Last 3 Encounters:  03/04/24 206 lb (93.4 kg)  11/25/23 206 lb 9.6 oz (93.7 kg)  11/13/23 206 lb (93.4 kg)    Diabetic Foot Exam - Simple   No data filed    Lab Results  Component Value Date   WBC 6.7 11/28/2023   HGB 13.7 11/28/2023   HCT 42.0 11/28/2023   PLT 121.0 (L) 11/28/2023   GLUCOSE 130 (H) 11/28/2023   CHOL 101 11/28/2023   TRIG 189.0 (H) 11/28/2023   HDL 58.90 11/28/2023   LDLCALC 4 11/28/2023   ALT 12 11/28/2023   AST 23 11/28/2023   NA 141 11/28/2023   K 4.5 11/28/2023   CL 104 11/28/2023   CREATININE 1.55 (H) 11/28/2023   BUN 16 11/28/2023   CO2 28 11/28/2023   TSH 2.15 11/28/2023   INR 1.1 (H) 12/12/2018   HGBA1C 6.0 11/28/2023    Lab Results  Component Value Date   TSH 2.15 11/28/2023   Lab Results  Component Value Date   WBC 6.7 11/28/2023   HGB 13.7 11/28/2023   HCT 42.0 11/28/2023   MCV 94.2 11/28/2023   PLT 121.0 (L) 11/28/2023   Lab Results  Component Value Date   NA 141 11/28/2023   K 4.5 11/28/2023   CO2 28 11/28/2023  GLUCOSE 130 (H) 11/28/2023   BUN 16 11/28/2023   CREATININE 1.55 (H) 11/28/2023   BILITOT 0.5 11/28/2023   ALKPHOS 98 11/28/2023   AST 23 11/28/2023   ALT 12 11/28/2023   PROT 6.0 11/28/2023   ALBUMIN 4.0 11/28/2023   CALCIUM  9.0 11/28/2023   ANIONGAP 12 09/15/2023   EGFR 47 (L) 09/11/2023   GFR 42.00 (L) 11/28/2023   Lab Results  Component Value Date   CHOL 101 11/28/2023   Lab Results  Component Value Date   HDL 58.90 11/28/2023   Lab Results  Component Value Date   LDLCALC 4 11/28/2023   Lab Results  Component Value Date   TRIG 189.0 (H) 11/28/2023   Lab Results  Component Value Date   CHOLHDL 2 11/28/2023   Lab Results  Component Value Date   HGBA1C 6.0 11/28/2023       Assessment & Plan:  Essential hypertension Assessment & Plan: Well controlled, no changes to meds. Encouraged heart healthy diet such as the DASH  diet and exercise as tolerated.     Class 1 obesity with body mass index (BMI) of 30.0 to 30.9 in adult, unspecified obesity type, unspecified whether serious comorbidity present Assessment & Plan: Encouraged DASH or MIND diet, decrease po intake and increase exercise as tolerated. Needs 7-8 hours of sleep nightly. Avoid trans fats, eat small, frequent meals every 4-5 hours with lean proteins, complex carbs and healthy fats. Minimize simple carbs, high fat foods and processed foods    Stage 3 chronic kidney disease, unspecified whether stage 3a or 3b CKD (HCC) Assessment & Plan: Hydrate and monitor   Thrombocytopenia Assessment & Plan: Mild, asymptomatic   Hyperglycemia Assessment & Plan: hgba1c acceptable, minimize simple carbs. Increase exercise as tolerated.    Parkinson's disease, unspecified whether dyskinesia present, unspecified whether manifestations fluctuate (HCC) Assessment & Plan: Seeing [facility] stable     Assessment and Plan Assessment & Plan Adult Wellness Visit Routine wellness visit with discussion on general health, family dynamics, and holiday plans. Emphasized the importance of vaccinations to prevent pneumonia and other illnesses. - Ordered blood work to monitor kidney function and other parameters. - Discussed vaccination options including RSV, COVID, and Prevnar 20 booster. - Recommended RSV vaccine today, followed by COVID and Prevnar 20 booster in a few weeks or after holidays.  Parkinson's disease with motor fluctuations Managed with carbidopa -levodopa  pump, improving medication adherence and symptom control. Reports improved constipation, possibly related to better medication management. - Continue carbidopa -levodopa  pump therapy.  Stage 3 chronic kidney disease Monitored with regular blood work to assess kidney function. - Ordered blood work to monitor kidney function.  Constipation Improved, possibly due to better management of Parkinson's  disease with carbidopa -levodopa  pump. Encouraged to maintain hydration and fiber intake. - Encouraged hydration with five ounces of water every hour while awake. - Encouraged adequate fiber intake.  Glaucoma Scheduled to see ophthalmologist next week. - Continue current glaucoma treatment regimen. - Follow up with ophthalmologist next week.  Recording duration: 28 minutes     Harlene Horton, MD

## 2024-03-11 NOTE — Assessment & Plan Note (Signed)
 Hydrate and monitor

## 2024-03-11 NOTE — Assessment & Plan Note (Signed)
 Encouraged DASH or MIND diet, decrease po intake and increase exercise as tolerated. Needs 7-8 hours of sleep nightly. Avoid trans fats, eat small, frequent meals every 4-5 hours with lean proteins, complex carbs and healthy fats. Minimize simple carbs, high fat foods and processed foods

## 2024-03-12 ENCOUNTER — Other Ambulatory Visit (HOSPITAL_BASED_OUTPATIENT_CLINIC_OR_DEPARTMENT_OTHER): Payer: Self-pay

## 2024-03-12 ENCOUNTER — Ambulatory Visit: Admitting: Family Medicine

## 2024-03-12 ENCOUNTER — Other Ambulatory Visit: Payer: Self-pay

## 2024-03-12 VITALS — BP 125/62 | HR 66 | Temp 97.7°F | Resp 16 | Ht 75.0 in | Wt 218.0 lb

## 2024-03-12 DIAGNOSIS — G20A1 Parkinson's disease without dyskinesia, without mention of fluctuations: Secondary | ICD-10-CM | POA: Diagnosis not present

## 2024-03-12 DIAGNOSIS — I10 Essential (primary) hypertension: Secondary | ICD-10-CM | POA: Diagnosis not present

## 2024-03-12 DIAGNOSIS — N183 Chronic kidney disease, stage 3 unspecified: Secondary | ICD-10-CM | POA: Diagnosis not present

## 2024-03-12 DIAGNOSIS — E785 Hyperlipidemia, unspecified: Secondary | ICD-10-CM

## 2024-03-12 DIAGNOSIS — Z23 Encounter for immunization: Secondary | ICD-10-CM | POA: Diagnosis not present

## 2024-03-12 DIAGNOSIS — D696 Thrombocytopenia, unspecified: Secondary | ICD-10-CM

## 2024-03-12 DIAGNOSIS — E66811 Obesity, class 1: Secondary | ICD-10-CM | POA: Diagnosis not present

## 2024-03-12 DIAGNOSIS — Z6830 Body mass index (BMI) 30.0-30.9, adult: Secondary | ICD-10-CM | POA: Diagnosis not present

## 2024-03-12 DIAGNOSIS — R739 Hyperglycemia, unspecified: Secondary | ICD-10-CM | POA: Diagnosis not present

## 2024-03-12 LAB — COMPREHENSIVE METABOLIC PANEL WITH GFR
ALT: 7 U/L (ref 0–53)
AST: 17 U/L (ref 0–37)
Albumin: 4.3 g/dL (ref 3.5–5.2)
Alkaline Phosphatase: 79 U/L (ref 39–117)
BUN: 21 mg/dL (ref 6–23)
CO2: 29 meq/L (ref 19–32)
Calcium: 9.5 mg/dL (ref 8.4–10.5)
Chloride: 106 meq/L (ref 96–112)
Creatinine, Ser: 1.48 mg/dL (ref 0.40–1.50)
GFR: 44.3 mL/min — ABNORMAL LOW (ref >60.00)
Glucose, Bld: 121 mg/dL — ABNORMAL HIGH (ref 70–99)
Potassium: 4.2 meq/L (ref 3.5–5.1)
Sodium: 141 meq/L (ref 135–145)
Total Bilirubin: 0.7 mg/dL (ref 0.2–1.2)
Total Protein: 6.1 g/dL (ref 6.0–8.3)

## 2024-03-12 LAB — CBC WITH DIFFERENTIAL/PLATELET
Basophils Absolute: 0 K/uL (ref 0.0–0.1)
Basophils Relative: 0.7 % (ref 0.0–3.0)
Eosinophils Absolute: 0 K/uL (ref 0.0–0.7)
Eosinophils Relative: 0.7 % (ref 0.0–5.0)
HCT: 43.1 % (ref 39.0–52.0)
Hemoglobin: 14.4 g/dL (ref 13.0–17.0)
Lymphocytes Relative: 30.1 % (ref 12.0–46.0)
Lymphs Abs: 2.1 K/uL (ref 0.7–4.0)
MCHC: 33.4 g/dL (ref 30.0–36.0)
MCV: 93.1 fl (ref 78.0–100.0)
Monocytes Absolute: 0.5 K/uL (ref 0.1–1.0)
Monocytes Relative: 7.3 % (ref 3.0–12.0)
Neutro Abs: 4.2 K/uL (ref 1.4–7.7)
Neutrophils Relative %: 61.2 % (ref 43.0–77.0)
Platelets: 118 K/uL — ABNORMAL LOW (ref 150.0–400.0)
RBC: 4.63 Mil/uL (ref 4.22–5.81)
RDW: 14 % (ref 11.5–15.5)
WBC: 6.8 K/uL (ref 4.0–10.5)

## 2024-03-12 LAB — LIPID PANEL
Cholesterol: 115 mg/dL (ref 0–200)
HDL: 69.1 mg/dL (ref >39.00)
LDL Cholesterol: 31 mg/dL (ref 0–99)
NonHDL: 45.43
Total CHOL/HDL Ratio: 2
Triglycerides: 71 mg/dL (ref 0.0–149.0)
VLDL: 14.2 mg/dL (ref 0.0–40.0)

## 2024-03-12 LAB — TSH: TSH: 2.93 u[IU]/mL (ref 0.35–5.50)

## 2024-03-12 LAB — HEMOGLOBIN A1C: Hgb A1c MFr Bld: 5.6 % (ref 4.6–6.5)

## 2024-03-12 MED ORDER — RSVPREF3 VAC RECOMB ADJUVANTED 120 MCG/0.5ML IM SUSR
0.5000 mL | Freq: Once | INTRAMUSCULAR | 0 refills | Status: AC
  Filled 2024-03-12: qty 0.5, 1d supply, fill #0

## 2024-03-12 NOTE — Patient Instructions (Signed)
 Annual covid and flu vaccines RSV, Respiratory Syncitial Virus Vaccine Prevnar 20 vaccines   All [facility] pharmacies are all walk vaccine clinics M-F 9-4

## 2024-03-13 ENCOUNTER — Ambulatory Visit: Payer: Self-pay | Admitting: Family Medicine

## 2024-03-15 ENCOUNTER — Encounter: Payer: Self-pay | Admitting: Family Medicine

## 2024-03-17 DIAGNOSIS — Z83511 Family history of glaucoma: Secondary | ICD-10-CM | POA: Diagnosis not present

## 2024-03-17 DIAGNOSIS — H0100B Unspecified blepharitis left eye, upper and lower eyelids: Secondary | ICD-10-CM | POA: Diagnosis not present

## 2024-03-17 DIAGNOSIS — H401112 Primary open-angle glaucoma, right eye, moderate stage: Secondary | ICD-10-CM | POA: Diagnosis not present

## 2024-03-17 DIAGNOSIS — H401123 Primary open-angle glaucoma, left eye, severe stage: Secondary | ICD-10-CM | POA: Diagnosis not present

## 2024-03-17 DIAGNOSIS — H0100A Unspecified blepharitis right eye, upper and lower eyelids: Secondary | ICD-10-CM | POA: Diagnosis not present

## 2024-03-17 DIAGNOSIS — H04123 Dry eye syndrome of bilateral lacrimal glands: Secondary | ICD-10-CM | POA: Diagnosis not present

## 2024-03-18 ENCOUNTER — Other Ambulatory Visit (HOSPITAL_BASED_OUTPATIENT_CLINIC_OR_DEPARTMENT_OTHER): Payer: Self-pay

## 2024-03-18 ENCOUNTER — Other Ambulatory Visit: Payer: Self-pay | Admitting: Family Medicine

## 2024-03-18 MED ORDER — ATENOLOL 25 MG PO TABS
12.5000 mg | ORAL_TABLET | Freq: Every day | ORAL | 1 refills | Status: AC
  Filled 2024-03-18: qty 45, 90d supply, fill #0

## 2024-04-01 ENCOUNTER — Telehealth: Payer: Self-pay | Admitting: Neurology

## 2024-04-01 NOTE — Telephone Encounter (Signed)
 Vincent Walker lvm wanting to speak with [facility]. She stated that she is having issues with the pump and the Vyalev  nurse is not available.  She is requesting a call back.   PH: [phone].

## 2024-04-01 NOTE — Telephone Encounter (Signed)
 Pt was able to get the nurse and he is back to normal ,

## 2024-04-07 ENCOUNTER — Other Ambulatory Visit: Payer: Self-pay

## 2024-04-07 ENCOUNTER — Other Ambulatory Visit (HOSPITAL_BASED_OUTPATIENT_CLINIC_OR_DEPARTMENT_OTHER): Payer: Self-pay

## 2024-04-07 DIAGNOSIS — F02A11 Dementia in other diseases classified elsewhere, mild, with agitation: Secondary | ICD-10-CM

## 2024-04-07 DIAGNOSIS — G20A1 Parkinson's disease without dyskinesia, without mention of fluctuations: Secondary | ICD-10-CM

## 2024-04-07 MED ORDER — DIVALPROEX SODIUM ER 250 MG PO TB24
250.0000 mg | ORAL_TABLET | Freq: Every day | ORAL | 0 refills | Status: AC
  Filled 2024-04-07: qty 90, 90d supply, fill #0

## 2024-04-08 ENCOUNTER — Encounter: Payer: Self-pay | Admitting: Neurology

## 2024-04-13 ENCOUNTER — Other Ambulatory Visit: Payer: Self-pay

## 2024-04-13 ENCOUNTER — Other Ambulatory Visit (HOSPITAL_BASED_OUTPATIENT_CLINIC_OR_DEPARTMENT_OTHER): Payer: Self-pay

## 2024-04-13 MED ORDER — MIRABEGRON ER 50 MG PO TB24
50.0000 mg | ORAL_TABLET | Freq: Every day | ORAL | 5 refills | Status: AC
  Filled 2024-04-13: qty 30, 30d supply, fill #0

## 2024-04-13 NOTE — Progress Notes (Unsigned)
 "   Assessment/Plan:   1.  Parkinsons Disease             - Patient doing really well on Vyalev .  No evidence of infection.  Will call and check on infusion sets as seems to be short every month             -- Loading dose: 1.10 mL on day 1 of implementation (given today) and if off infusion for > 3 hours  - Base rate: 0.38 mL/h  - Low rate: 0.34 mL/h  - High rate: 0.42 mL/h  - Extra dose:: 0.15 mL   -discussed caregiving stressors and discussed counseling for wife.  Discussed respite for wife and how to obtain services.    2.  RBD/RLS/insomnia             -Continue clonazepam  0.5 mg, full tablet at bedtime.  Tried to stop this medication and things got much worse, especially mood change and daytime hypersomnolence did not get better.  Lower dosages resulted in increasing RBD symptoms.               -on mirtazapine , 15 mg q hs.               -some of night issues due to nocturia.  He is on several medications for nocturia 3.  Anxiety/depression             -Being followed by primary care.  On Depakote  ER, 250 mg daily.  May need to increase this in the future.  Nighttime agitation is becoming more problematic 4.  Daytime hypersomnolence             -Nocturnal polysomnogram was negative, but he was awake most of the night so not sure that it was accurate 5.  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             -Has declined Nuplazid thus far             - continue Donepezil , 10 mg daily.  This has helped hallucinations             -encouraged counseling for wife as caregiving burden increases.   6.  AM dizziness             -don't think related to Parkinsons Disease meds as it starts in the AM prior to taking meds and doesn't persist in the day             -he has d/c the amlodipine              -He is on low-dose atenolol  for PVCs.  They have decreased the dose of time.  They don't think that they can  decrease further d/t pvc's.  7.  Renal insuff             -likely due to dehydration             -may contribute to #6 8.  nocturia  - Now following with urology and on Myrbetriq  and flomax .  This is helping although not resolved. 9.  Weight gain  -significant weight gain over the last month (15 lbs).  No LE edema to suggest volume overload.  Pt admits that he ate a lot over the holidays.  Discussed that he needs to get back to proper diet and exercise.  Subjective:   Vincent Walker was seen today in follow up for Parkinsons disease.  My previous records were reviewed prior to todays visit as well as outside records available to me. Pt with wife who supplements hx. patient has really been doing fairly well with the Vyalev  pump but they note the company has been shorting him infusion sets.  He is running at base rate.  Has not had to do any extra dosages.  He has had no evidence of infection.  He has had no falls.  No near syncope.  Having some hallucinations but wife isn't sure if they perhaps were real because patient said he saw a mouse run across floor.  Wife hasn't noted any.  Pt also notes that he notes some mayflies in the home.  Does note more trouble with word processing/word finding.  Mood has been fairly good but didn't sleep well last night so he is in a funk today.  Not exercising faithfully since christmas.  He did see his primary care doctor since last visit.  Those notes are reviewed.  He saw ophthalmology December 16.  Those notes are reviewed as well.  Current prescribed movement disorder medications: Vyalev  Clonazepam  0.5 mg, 1 tablet at bedtime  Donepezil   Mirtazapine , 15 mg nightly Depakote , 250 mg daily   Levodopa  dosage prior to initiation of Vyalev : carbidopa /levodopa  25/100 , 2 at 8am/2 at 11am/2 at 2pm/1 at 6pm Carbidopa /levodopa  50/200 at bed  PREVIOUS MEDICATIONS: Carbidopa /levodopa  25/100 IR; carbidopa /levodopa  25/100 CR (tried to change to see if would  help EDS and didn't so changed back); inbrija   ALLERGIES:   Allergies  Allergen Reactions   Nsaids    Sulfa Antibiotics Other (See Comments)    Had a reaction as a child   Tolmetin Other (See Comments)    CURRENT MEDICATIONS:  Outpatient Encounter Medications as of 04/14/2024  Medication Sig   acetaminophen  (TYLENOL ) 500 MG tablet Take 1,000 mg by mouth every 6 (six) hours as needed (pain.).    atenolol  (TENORMIN ) 25 MG tablet Take 0.5 tablets (12.5 mg total) by mouth daily.   atorvastatin  (LIPITOR) 20 MG tablet Take 1 tablet (20 mg total) by mouth daily.   Brinzolamide -Brimonidine  (SIMBRINZA ) 1-0.2 % SUSP Place 1 drop into both eyes 3 (three) times daily.   clonazePAM  (KLONOPIN ) 0.5 MG tablet Take 1 tablet (0.5 mg total) by mouth at bedtime.   divalproex  (DEPAKOTE  ER) 250 MG 24 hr tablet Take 1 tablet (250 mg total) by mouth daily.   donepezil  (ARICEPT ) 10 MG tablet Take 1 tablet (10 mg total) by mouth at bedtime.   Foscarbidopa-Foslevodopa (VYALEV ) 12-240 MG/ML SOLN Inject 0.38 mL/hr into the skin daily.   ketoconazole  (NIZORAL ) 2 % shampoo Use every other day, applying directly to damp scalp, lather, let sit 5-10 minutes and rinse.   MAGNESIUM GLYCINATE PO Take 200 mg by mouth at bedtime.   mirabegron  ER (MYRBETRIQ ) 50 MG TB24 tablet Take 1 tablet (50 mg total) by mouth daily.   mirtazapine  (REMERON ) 15 MG tablet Take 1 tablet (15 mg total) by mouth at bedtime.   Multiple Vitamins-Minerals (PRESERVISION AREDS 2 PO) Take 1 tablet by mouth daily.   Polyethyl Glycol-Propyl Glycol (LUBRICANT EYE DROPS) 0.4-0.3 % SOLN Place 1 drop into both eyes 3 (three) times daily as needed (dry/irritated eyes.).   tamsulosin  (FLOMAX ) 0.4 MG CAPS capsule Take 1 capsule (0.4 mg total) by mouth daily.   [DISCONTINUED] mirabegron  ER (MYRBETRIQ ) 50 MG TB24 tablet Take 1 tablet (50 mg total)  by mouth daily.   No facility-administered encounter medications on file as of 04/14/2024.    Objective:    PHYSICAL EXAMINATION:    VITALS:   Vitals:   04/14/24 1109  BP: 126/86  Pulse: 69  SpO2: 98%  Weight: 221 lb (100.2 kg)    Wt Readings from Last 3 Encounters:  04/14/24 221 lb (100.2 kg)  03/12/24 218 lb (98.9 kg)  03/04/24 206 lb (93.4 kg)   GEN:  The patient appears stated age and is in NAD. HEENT:  Normocephalic, atraumatic.  The mucous membranes are moist. The superficial temporal arteries are without ropiness or tenderness. CV:  RRR Lungs:  CTAB Neck/HEME:  There are no carotid bruits bilaterally. Skin: There is no erythema, drainage, induration, warmth in the abdomen over the sites that he has used for infusion  Neurological examination:  Orientation: The patient is alert and oriented x3  Cranial nerves: There is good facial symmetry with facial hypomimia. The speech is fluent and clear. Soft palate rises symmetrically.  Hearing is intact to conversational tone. Sensation: Sensation is intact to light touch throughout Motor: Strength is at least antigravity x4.  Movement examination: Tone: There is normal tone today in the upper and lower extremities. Abnormal movements: there is rare tremor of the R thumb Coordination:  There is no decremation, with any form of RAMS, including alternating supination and pronation of the forearm, hand opening and closing, finger taps, heel taps and toe taps.  Gait and Station: The patient pushes off to arise.  He uses his rollator and ambulates well in the hall with that (similar to last visit)  I have reviewed and interpreted the following labs independently    Chemistry      Component Value Date/Time   NA 141 03/12/2024 1218   K 4.2 03/12/2024 1218   CL 106 03/12/2024 1218   CO2 29 03/12/2024 1218   BUN 21 03/12/2024 1218   CREATININE 1.48 03/12/2024 1218   CREATININE 1.49 (H) 09/11/2023 1312      Component Value Date/Time   CALCIUM  9.5 03/12/2024 1218   ALKPHOS 79 03/12/2024 1218   AST 17 03/12/2024 1218   ALT 7  03/12/2024 1218   BILITOT 0.7 03/12/2024 1218       Lab Results  Component Value Date   WBC 6.8 03/12/2024   HGB 14.4 03/12/2024   HCT 43.1 03/12/2024   MCV 93.1 03/12/2024   PLT 118.0 (L) 03/12/2024    Lab Results  Component Value Date   TSH 2.93 03/12/2024     Total time spent on today's visit was 40 minutes, including both face-to-face time and nonface-to-face time.  Time included that spent on review of records (prior notes available to me/labs/imaging if pertinent), discussing treatment and goals, answering patient's questions and coordinating care.  Cc:  Domenica Harlene LABOR, MD "

## 2024-04-14 ENCOUNTER — Ambulatory Visit (INDEPENDENT_AMBULATORY_CARE_PROVIDER_SITE_OTHER): Admitting: Neurology

## 2024-04-14 VITALS — BP 126/86 | HR 69 | Wt 221.0 lb

## 2024-04-14 DIAGNOSIS — F02A3 Dementia in other diseases classified elsewhere, mild, with mood disturbance: Secondary | ICD-10-CM | POA: Diagnosis not present

## 2024-04-14 DIAGNOSIS — G20A2 Parkinson's disease without dyskinesia, with fluctuations: Secondary | ICD-10-CM

## 2024-04-14 DIAGNOSIS — R635 Abnormal weight gain: Secondary | ICD-10-CM

## 2024-04-14 DIAGNOSIS — G20A1 Parkinson's disease without dyskinesia, without mention of fluctuations: Secondary | ICD-10-CM | POA: Diagnosis not present

## 2024-04-23 NOTE — Telephone Encounter (Signed)
 Called pt to go over options for new provider. Pt does not want to decide on anyone yet. They will call back later to set up an appt.

## 2024-04-30 ENCOUNTER — Encounter: Payer: Self-pay | Admitting: Neurology

## 2024-05-01 ENCOUNTER — Telehealth: Payer: Self-pay | Admitting: Neurology

## 2024-05-01 NOTE — Telephone Encounter (Signed)
 Called Dee back and she is still waiting on [employer] to get there

## 2024-05-01 NOTE — Telephone Encounter (Signed)
 Dee(spouse) called in wanting to speak with [facility] regarding issues with supplies. She is requesting a call back asap.   [phone]

## 2024-05-01 NOTE — Telephone Encounter (Signed)
 Dr. Evonnie she has still not received the cannulas can she just change the syringe this one time today?

## 2024-05-05 ENCOUNTER — Encounter: Payer: Self-pay | Admitting: Neurology

## 2024-06-11 ENCOUNTER — Ambulatory Visit: Admitting: Family Medicine

## 2024-06-11 ENCOUNTER — Ambulatory Visit: Admitting: Student

## 2024-10-15 ENCOUNTER — Ambulatory Visit: Payer: Self-pay | Admitting: Neurology
